# Patient Record
Sex: Female | Born: 1950 | Race: White | Hispanic: No | Marital: Single | Smoking: Never smoker
Health system: Southern US, Community
[De-identification: ages and names within clinical notes are randomized; demographics above are authoritative.]

## PROBLEM LIST (undated history)

## (undated) DIAGNOSIS — C9110 Chronic lymphocytic leukemia of B-cell type not having achieved remission: Secondary | ICD-10-CM

## (undated) DIAGNOSIS — R519 Headache, unspecified: Secondary | ICD-10-CM

## (undated) DIAGNOSIS — E785 Hyperlipidemia, unspecified: Secondary | ICD-10-CM

## (undated) DIAGNOSIS — C801 Malignant (primary) neoplasm, unspecified: Secondary | ICD-10-CM

## (undated) DIAGNOSIS — I10 Essential (primary) hypertension: Secondary | ICD-10-CM

## (undated) DIAGNOSIS — C8590 Non-Hodgkin lymphoma, unspecified, unspecified site: Secondary | ICD-10-CM

## (undated) DIAGNOSIS — E079 Disorder of thyroid, unspecified: Secondary | ICD-10-CM

## (undated) DIAGNOSIS — M1990 Unspecified osteoarthritis, unspecified site: Secondary | ICD-10-CM

## (undated) DIAGNOSIS — Z973 Presence of spectacles and contact lenses: Secondary | ICD-10-CM

## (undated) DIAGNOSIS — E039 Hypothyroidism, unspecified: Secondary | ICD-10-CM

## (undated) DIAGNOSIS — T7840XA Allergy, unspecified, initial encounter: Secondary | ICD-10-CM

## (undated) DIAGNOSIS — R51 Headache: Secondary | ICD-10-CM

## (undated) HISTORY — DX: Hyperlipidemia, unspecified: E78.5

## (undated) HISTORY — PX: TONSILLECTOMY: SUR1361

## (undated) HISTORY — PX: ABDOMINAL HYSTERECTOMY: SHX81

## (undated) HISTORY — DX: Disorder of thyroid, unspecified: E07.9

## (undated) HISTORY — PX: LYMPH NODE DISSECTION: SHX5087

## (undated) HISTORY — PX: COLONOSCOPY: SHX174

## (undated) HISTORY — DX: Essential (primary) hypertension: I10

## (undated) HISTORY — DX: Malignant (primary) neoplasm, unspecified: C80.1

## (undated) HISTORY — PX: BREAST EXCISIONAL BIOPSY: SUR124

## (undated) HISTORY — DX: Unspecified osteoarthritis, unspecified site: M19.90

## (undated) HISTORY — PX: STRABISMUS SURGERY: SHX218

## (undated) HISTORY — DX: Allergy, unspecified, initial encounter: T78.40XA

---

## 1996-02-26 LAB — CONVERTED CEMR LAB: Pap Smear: NORMAL

## 2001-08-31 ENCOUNTER — Emergency Department (HOSPITAL_COMMUNITY): Admission: EM | Admit: 2001-08-31 | Discharge: 2001-08-31 | Payer: Self-pay | Admitting: Emergency Medicine

## 2009-05-12 ENCOUNTER — Ambulatory Visit: Payer: Self-pay | Admitting: Oncology

## 2009-06-06 LAB — COMPREHENSIVE METABOLIC PANEL
Albumin: 4.6 g/dL (ref 3.5–5.2)
BUN: 13 mg/dL (ref 6–23)
CO2: 26 mEq/L (ref 19–32)
Calcium: 9.2 mg/dL (ref 8.4–10.5)
Chloride: 101 mEq/L (ref 96–112)
Creatinine, Ser: 0.8 mg/dL (ref 0.40–1.20)
Glucose, Bld: 217 mg/dL — ABNORMAL HIGH (ref 70–99)
Potassium: 3.8 mEq/L (ref 3.5–5.3)
Total Bilirubin: 0.5 mg/dL (ref 0.3–1.2)
Total Protein: 6.6 g/dL (ref 6.0–8.3)

## 2009-06-06 LAB — CBC & DIFF AND RETIC
Immature Retic Fract: 2.2 % (ref 0.00–10.70)
LYMPH%: 31.3 % (ref 14.0–49.7)
MCH: 29.5 pg (ref 25.1–34.0)
NEUT%: 56.7 % (ref 38.4–76.8)
Platelets: 194 10*3/uL (ref 145–400)
RBC: 5.18 10*6/uL (ref 3.70–5.45)
RDW: 13.2 % (ref 11.2–14.5)
Retic %: 1.14 % (ref 0.50–1.50)

## 2009-06-06 LAB — FOLATE: Folate: 10.4 ng/mL

## 2009-06-06 LAB — VITAMIN B12: Vitamin B-12: 334 pg/mL (ref 211–911)

## 2009-06-06 LAB — FERRITIN: Ferritin: 63 ng/mL (ref 10–291)

## 2009-06-06 LAB — CHCC SMEAR

## 2009-06-06 LAB — URIC ACID: Uric Acid, Serum: 6.3 mg/dL (ref 2.4–7.0)

## 2009-06-06 LAB — MORPHOLOGY: PLT EST: ADEQUATE

## 2009-07-14 ENCOUNTER — Ambulatory Visit: Payer: Self-pay | Admitting: Oncology

## 2009-07-18 LAB — CBC & DIFF AND RETIC
Basophils Absolute: 0 10*3/uL (ref 0.0–0.1)
EOS%: 7 % (ref 0.0–7.0)
Eosinophils Absolute: 0.4 10*3/uL (ref 0.0–0.5)
Immature Retic Fract: 4.6 % (ref 0.00–10.70)
LYMPH%: 32.4 % (ref 14.0–49.7)
MCHC: 34.1 g/dL (ref 31.5–36.0)
MCV: 85.5 fL (ref 79.5–101.0)
MONO#: 0.4 10*3/uL (ref 0.1–0.9)
MONO%: 6.6 % (ref 0.0–14.0)
NEUT%: 53.2 % (ref 38.4–76.8)
RDW: 13.4 % (ref 11.2–14.5)
Retic %: 1.19 % (ref 0.50–1.50)
Retic Ct Abs: 63.19 10*3/uL (ref 18.30–72.70)

## 2009-07-18 LAB — COMPREHENSIVE METABOLIC PANEL
AST: 28 U/L (ref 0–37)
BUN: 12 mg/dL (ref 6–23)
CO2: 25 mEq/L (ref 19–32)
Creatinine, Ser: 0.71 mg/dL (ref 0.40–1.20)
Glucose, Bld: 158 mg/dL — ABNORMAL HIGH (ref 70–99)
Total Bilirubin: 0.8 mg/dL (ref 0.3–1.2)

## 2009-07-29 ENCOUNTER — Ambulatory Visit: Payer: Self-pay | Admitting: Internal Medicine

## 2009-07-29 DIAGNOSIS — J45909 Unspecified asthma, uncomplicated: Secondary | ICD-10-CM

## 2009-07-29 DIAGNOSIS — I10 Essential (primary) hypertension: Secondary | ICD-10-CM

## 2009-07-29 DIAGNOSIS — E1165 Type 2 diabetes mellitus with hyperglycemia: Secondary | ICD-10-CM

## 2009-07-29 DIAGNOSIS — R9431 Abnormal electrocardiogram [ECG] [EKG]: Secondary | ICD-10-CM

## 2009-07-29 DIAGNOSIS — E039 Hypothyroidism, unspecified: Secondary | ICD-10-CM

## 2009-07-29 DIAGNOSIS — M1710 Unilateral primary osteoarthritis, unspecified knee: Secondary | ICD-10-CM

## 2009-08-01 ENCOUNTER — Encounter: Payer: Self-pay | Admitting: Internal Medicine

## 2009-08-03 ENCOUNTER — Telehealth (INDEPENDENT_AMBULATORY_CARE_PROVIDER_SITE_OTHER): Payer: Self-pay | Admitting: Radiology

## 2009-08-18 ENCOUNTER — Encounter: Payer: Self-pay | Admitting: Internal Medicine

## 2009-09-07 ENCOUNTER — Ambulatory Visit: Payer: Self-pay | Admitting: Internal Medicine

## 2009-09-07 DIAGNOSIS — E7800 Pure hypercholesterolemia, unspecified: Secondary | ICD-10-CM

## 2009-09-15 ENCOUNTER — Encounter: Payer: Self-pay | Admitting: Internal Medicine

## 2009-10-14 ENCOUNTER — Ambulatory Visit: Payer: Self-pay | Admitting: Oncology

## 2009-10-18 LAB — WHOLE BLOOD GLUCOSE

## 2009-10-18 LAB — CBC WITH DIFFERENTIAL/PLATELET
EOS%: 7.1 % — ABNORMAL HIGH (ref 0.0–7.0)
MCH: 29.6 pg (ref 25.1–34.0)
MONO%: 6.1 % (ref 0.0–14.0)
RBC: 5.1 10*6/uL (ref 3.70–5.45)
RDW: 13.7 % (ref 11.2–14.5)
WBC: 6.2 10*3/uL (ref 3.9–10.3)
lymph#: 1.9 10*3/uL (ref 0.9–3.3)

## 2009-10-24 ENCOUNTER — Telehealth: Payer: Self-pay | Admitting: Internal Medicine

## 2010-01-11 ENCOUNTER — Ambulatory Visit: Payer: Self-pay | Admitting: Oncology

## 2010-01-12 LAB — COMPREHENSIVE METABOLIC PANEL
Alkaline Phosphatase: 89 U/L (ref 39–117)
BUN: 16 mg/dL (ref 6–23)
CO2: 26 mEq/L (ref 19–32)
Chloride: 103 mEq/L (ref 96–112)
Potassium: 3.9 mEq/L (ref 3.5–5.3)
Total Bilirubin: 0.6 mg/dL (ref 0.3–1.2)

## 2010-01-12 LAB — CBC WITH DIFFERENTIAL/PLATELET
BASO%: 0.5 % (ref 0.0–2.0)
LYMPH%: 29.9 % (ref 14.0–49.7)
MONO%: 4.6 % (ref 0.0–14.0)
NEUT%: 58 % (ref 38.4–76.8)
RBC: 5 10*6/uL (ref 3.70–5.45)
WBC: 5 10*3/uL (ref 3.9–10.3)

## 2010-01-24 ENCOUNTER — Ambulatory Visit: Payer: Self-pay | Admitting: Internal Medicine

## 2010-01-24 ENCOUNTER — Encounter (INDEPENDENT_AMBULATORY_CARE_PROVIDER_SITE_OTHER): Payer: Self-pay | Admitting: *Deleted

## 2010-01-24 LAB — CONVERTED CEMR LAB
Albumin: 4.2 g/dL (ref 3.5–5.2)
BUN: 17 mg/dL (ref 6–23)
Calcium: 9.5 mg/dL (ref 8.4–10.5)
Chloride: 101 meq/L (ref 96–112)
Cholesterol: 207 mg/dL — ABNORMAL HIGH (ref 0–200)
Creatinine, Ser: 0.7 mg/dL (ref 0.4–1.2)
Direct LDL: 145 mg/dL
Glucose, Bld: 109 mg/dL — ABNORMAL HIGH (ref 70–99)
Hemoglobin, Urine: NEGATIVE
Ketones, ur: NEGATIVE mg/dL
Leukocytes, UA: NEGATIVE
Nitrite: NEGATIVE
Total CHOL/HDL Ratio: 5
Total Protein, Urine: NEGATIVE mg/dL
Triglycerides: 192 mg/dL — ABNORMAL HIGH (ref 0.0–149.0)
Urobilinogen, UA: 0.2 (ref 0.0–1.0)
pH: 5.5 (ref 5.0–8.0)

## 2010-01-25 ENCOUNTER — Telehealth: Payer: Self-pay | Admitting: Internal Medicine

## 2010-01-25 ENCOUNTER — Encounter: Payer: Self-pay | Admitting: Internal Medicine

## 2010-01-31 ENCOUNTER — Encounter: Admission: RE | Admit: 2010-01-31 | Discharge: 2010-01-31 | Payer: Self-pay | Admitting: Internal Medicine

## 2010-01-31 ENCOUNTER — Ambulatory Visit: Payer: Self-pay | Admitting: Internal Medicine

## 2010-01-31 LAB — CONVERTED CEMR LAB
Cholesterol: 247 mg/dL — ABNORMAL HIGH (ref 0–200)
Total CHOL/HDL Ratio: 5
Triglycerides: 248 mg/dL — ABNORMAL HIGH (ref 0.0–149.0)
VLDL: 49.6 mg/dL — ABNORMAL HIGH (ref 0.0–40.0)

## 2010-02-01 ENCOUNTER — Encounter: Payer: Self-pay | Admitting: Internal Medicine

## 2010-02-03 ENCOUNTER — Telehealth: Payer: Self-pay | Admitting: Internal Medicine

## 2010-03-31 ENCOUNTER — Ambulatory Visit: Payer: Self-pay | Admitting: Oncology

## 2010-04-12 LAB — CBC WITH DIFFERENTIAL/PLATELET
Basophils Absolute: 0 10*3/uL (ref 0.0–0.1)
Eosinophils Absolute: 0.4 10*3/uL (ref 0.0–0.5)
HCT: 42 % (ref 34.8–46.6)
HGB: 14.4 g/dL (ref 11.6–15.9)
LYMPH%: 32.2 % (ref 14.0–49.7)
MCHC: 34.2 g/dL (ref 31.5–36.0)
NEUT#: 2.8 10*3/uL (ref 1.5–6.5)
NEUT%: 52.8 % (ref 38.4–76.8)
RDW: 14.1 % (ref 11.2–14.5)
lymph#: 1.7 10*3/uL (ref 0.9–3.3)

## 2010-06-23 ENCOUNTER — Ambulatory Visit: Payer: Self-pay | Admitting: Oncology

## 2010-06-27 LAB — CBC WITH DIFFERENTIAL/PLATELET
Basophils Absolute: 0.1 10*3/uL (ref 0.0–0.1)
EOS%: 8.2 % — ABNORMAL HIGH (ref 0.0–7.0)
HGB: 14.1 g/dL (ref 11.6–15.9)
LYMPH%: 36.2 % (ref 14.0–49.7)
MONO%: 5.7 % (ref 0.0–14.0)
NEUT#: 2 10*3/uL (ref 1.5–6.5)
Platelets: 182 10*3/uL (ref 145–400)
RDW: 14.3 % (ref 11.2–14.5)

## 2010-07-24 ENCOUNTER — Ambulatory Visit: Payer: Self-pay | Admitting: Oncology

## 2010-07-24 LAB — COMPREHENSIVE METABOLIC PANEL
AST: 24 U/L (ref 0–37)
Alkaline Phosphatase: 81 U/L (ref 39–117)
BUN: 13 mg/dL (ref 6–23)
CO2: 26 mEq/L (ref 19–32)
Calcium: 9.2 mg/dL (ref 8.4–10.5)
Chloride: 105 mEq/L (ref 96–112)
Creatinine, Ser: 0.75 mg/dL (ref 0.40–1.20)
Glucose, Bld: 132 mg/dL — ABNORMAL HIGH (ref 70–99)
Potassium: 4.1 mEq/L (ref 3.5–5.3)
Sodium: 138 mEq/L (ref 135–145)

## 2010-07-24 LAB — CBC WITH DIFFERENTIAL/PLATELET
BASO%: 0.4 % (ref 0.0–2.0)
MCH: 29.5 pg (ref 25.1–34.0)
NEUT#: 2.5 10*3/uL (ref 1.5–6.5)
NEUT%: 53 % (ref 38.4–76.8)
Platelets: 206 10*3/uL (ref 145–400)
WBC: 4.7 10*3/uL (ref 3.9–10.3)
lymph#: 1.5 10*3/uL (ref 0.9–3.3)

## 2010-07-24 LAB — HEMOGLOBIN A1C
Hgb A1c MFr Bld: 6.6 % — ABNORMAL HIGH (ref <5.7)
Mean Plasma Glucose: 143 mg/dL — ABNORMAL HIGH (ref <117)

## 2010-10-08 LAB — CONVERTED CEMR LAB
ALT: 30 units/L (ref 0–35)
AST: 27 units/L (ref 0–37)
Cholesterol: 214 mg/dL — ABNORMAL HIGH (ref 0–200)
Direct LDL: 153.6 mg/dL
Glucose, Bld: 163 mg/dL — ABNORMAL HIGH (ref 70–99)
HDL: 36.5 mg/dL — ABNORMAL LOW (ref >39.00)
Hgb A1c MFr Bld: 8.5 % — ABNORMAL HIGH (ref 4.6–6.5)
Nitrite: NEGATIVE
Sodium: 141 meq/L (ref 135–145)
Specific Gravity, Urine: 1.025 (ref 1.000–1.030)
Total CHOL/HDL Ratio: 6
Total Protein, Urine: NEGATIVE mg/dL
Total Protein: 7.5 g/dL (ref 6.0–8.3)
Urobilinogen, UA: 0.2 (ref 0.0–1.0)
VLDL: 41.4 mg/dL — ABNORMAL HIGH (ref 0.0–40.0)
pH: 5 (ref 5.0–8.0)

## 2010-10-10 NOTE — Assessment & Plan Note (Signed)
Summary: 3 MO ROV /NWS  #   Vital Signs:  Patient profile:   60 year old female Height:      67 inches Weight:      217.50 pounds BMI:     34.19 O2 Sat:      96 % on Room air Temp:     97.4 degrees F oral Pulse rate:   68 / minute Pulse rhythm:   regular Resp:     16 per minute BP sitting:   132 / 78  (left arm) Cuff size:   regular  Vitals Entered By: Lucious Groves (Jan 24, 2010 9:56 AM)  Nutrition Counseling: Patient's BMI is greater than 25 and therefore counseled on weight management options.  O2 Flow:  Room air CC: Follow-up visit./kb, Preventive Care Is Patient Diabetic? Yes Pain Assessment Patient in pain? no      Comments Patient notes that she is not using the Freestyle meter/strips, nor is she taking Metformin./kb   Primary Care Provider:  Etta Grandchild MD  CC:  Follow-up visit./kb and Preventive Care.  History of Present Illness:  Follow-Up Visit      This is a 60 year old woman who presents for Follow-up visit.  The patient denies chest pain, palpitations, dizziness, syncope, low blood sugar symptoms, high blood sugar symptoms, edema, SOB, DOE, PND, and orthopnea.  Since the last visit the patient notes no new problems or concerns.  The patient reports taking meds as prescribed, monitoring BP, monitoring blood sugars, and dietary compliance.  When questioned about possible medication side effects, the patient notes none.    Preventive Screening-Counseling & Management  Alcohol-Tobacco     Alcohol drinks/day: 0     Smoking Status: never  Hep-HIV-STD-Contraception     Hepatitis Risk: no risk noted     HIV Risk: no risk noted     STD Risk: no risk noted     SBE monthly: yes     SBE Education/Counseling: to perform regular SBE      Sexual History:  not active.        Drug Use:  no.        Blood Transfusions:  no.    Clinical Review Panels:  Lipid Management   Cholesterol:  214 (07/29/2009)   HDL (good cholesterol):  36.50  (07/29/2009)  Diabetes Management   HgBA1C:  8.5 (07/29/2009)   Creatinine:  0.7 (07/29/2009)   Last Foot Exam:  yes (01/24/2010)   Last Flu Vaccine:  Fluvax 3+ (07/29/2009)   Last Pneumovax:  Pneumovax (06/29/2009)  Complete Metabolic Panel   Glucose:  163 (07/29/2009)   Sodium:  141 (07/29/2009)   Potassium:  4.1 (07/29/2009)   Chloride:  103 (07/29/2009)   CO2:  31 (07/29/2009)   BUN:  9 (07/29/2009)   Creatinine:  0.7 (07/29/2009)   Albumin:  4.4 (07/29/2009)   Total Protein:  7.5 (07/29/2009)   Calcium:  9.3 (07/29/2009)   Total Bili:  0.6 (07/29/2009)   Alk Phos:  96 (07/29/2009)   SGPT (ALT):  30 (07/29/2009)   SGOT (AST):  27 (07/29/2009)   Medications Prior to Update: 1)  Synthroid 88 Mcg Tabs (Levothyroxine Sodium) .... Take 1 Tablet By Mouth Once A Day 2)  Advair Diskus 100-50 Mcg/dose Aepb (Fluticasone-Salmeterol) .... One Puff Daily 3)  Diovan Hct 160-12.5 Mg Tabs (Valsartan-Hydrochlorothiazide) .... Once Daily For High Blood Pressure 4)  Freestyle Lite  Devi (Blood Glucose Monitoring Suppl) .... Use Two Times A Day As Directed 5)  Freestyle Lite Test  Strp (Glucose Blood) .... Use Two Times A Day As Directed 6)  Metformin Hcl 750 Mg Xr24h-Tab (Metformin Hcl) .... One Once Daily For Diabetes 7)  Zithromax Z-Pak 250 Mg Tabs (Azithromycin) .... As Directed  Current Medications (verified): 1)  Synthroid 88 Mcg Tabs (Levothyroxine Sodium) .... Take 1 Tablet By Mouth Once A Day 2)  Advair Diskus 100-50 Mcg/dose Aepb (Fluticasone-Salmeterol) .... One Puff Daily 3)  Diovan Hct 160-12.5 Mg Tabs (Valsartan-Hydrochlorothiazide) .... Once Daily For High Blood Pressure  Allergies (verified): 1)  ! Keflex  Past History:  Past Medical History: Reviewed history from 07/29/2009 and no changes required. Hypertension Hypothyroidism CLL Asthma Osteoarthritis Probable sleep apnea  Past Surgical History: Reviewed history from 07/29/2009 and no changes  required. Hysterectomy Tonsillectomy  Family History: Reviewed history from 07/29/2009 and no changes required. Family History of CAD Female 1st degree relative <50 Family History Diabetes 1st degree relative Family History High cholesterol Family History Hypertension  Social History: Reviewed history from 07/29/2009 and no changes required. Occupation: OR Engineer, civil (consulting) at [employer] Single Never Smoked Alcohol use-no Drug use-no Regular exercise-no Hepatitis Risk:  no risk noted HIV Risk:  no risk noted STD Risk:  no risk noted Sexual History:  not active Blood Transfusions:  no  Review of Systems  The patient denies anorexia, fever, weight gain, chest pain, syncope, dyspnea on exertion, peripheral edema, prolonged cough, headaches, hemoptysis, abdominal pain, hematuria, suspicious skin lesions, difficulty walking, depression, abnormal bleeding, enlarged lymph nodes, and breast masses.   Endo:  Denies cold intolerance, excessive hunger, excessive thirst, excessive urination, heat intolerance, polyuria, and weight change. Heme:  Denies abnormal bruising, bleeding, enlarge lymph nodes, fevers, pallor, and skin discoloration.  Physical Exam  General:  alert, well-developed, well-nourished, well-hydrated, appropriate dress, normal appearance, healthy-appearing, cooperative to examination, and overweight-appearing.   Head:  normocephalic, atraumatic, no abnormalities observed, and no abnormalities palpated.   Mouth:  Oral mucosa and oropharynx without lesions or exudates.  Teeth in good repair. Neck:  supple, full ROM, no masses, no thyromegaly, no thyroid nodules or tenderness, no JVD, normal carotid upstroke, no carotid bruits, and no cervical lymphadenopathy.   Lungs:  normal respiratory effort, no intercostal retractions, no accessory muscle use, and normal breath sounds.   Heart:  normal rate, regular rhythm, no murmur, no gallop, and no rub.   Abdomen:  soft, non-tender, normal bowel  sounds, no distention, no masses, no guarding, no hepatomegaly, and no splenomegaly.   Msk:  normal ROM, no joint tenderness, no joint swelling, and no joint warmth.   Pulses:  R and L carotid,radial,femoral,dorsalis pedis and posterior tibial pulses are full and equal bilaterally Extremities:  No clubbing, cyanosis, edema, or deformity noted with normal full range of motion of all joints.   Neurologic:  No cranial nerve deficits noted. Station and gait are normal. Plantar reflexes are down-going bilaterally. DTRs are symmetrical throughout. Sensory, motor and coordinative functions appear intact.  Diabetes Management Exam:    Foot Exam (with socks and/or shoes not present):       Sensory-Pinprick/Light touch:          Left medial foot (L-4): normal          Left dorsal foot (L-5): normal          Left lateral foot (S-1): normal          Right medial foot (L-4): normal          Right dorsal foot (L-5): normal  Right lateral foot (S-1): normal       Sensory-Monofilament:          Left foot: normal          Right foot: normal       Inspection:          Left foot: normal          Right foot: normal       Nails:          Left foot: normal          Right foot: normal   Impression & Recommendations:  Problem # 1:  ROUTINE GENERAL MEDICAL EXAM@HEALTH  CARE FACL (ICD-V70.0) Assessment New  Orders: Radiology Referral (Radiology) Gastroenterology Referral (GI)  Problem # 2:  PURE HYPERCHOLESTEROLEMIA (ICD-272.0) Assessment: Unchanged  Orders: Venipuncture (30865) TLB-BMP (Basic Metabolic Panel-BMET) (80048-METABOL) TLB-Hepatic/Liver Function Pnl (80076-HEPATIC) TLB-TSH (Thyroid Stimulating Hormone) (84443-TSH) TLB-A1C / Hgb A1C (Glycohemoglobin) (83036-A1C) TLB-Udip w/ Micro (81001-URINE) TLB-Lipid Panel (80061-LIPID)  Labs Reviewed: SGOT: 27 (07/29/2009)   SGPT: 30 (07/29/2009)  Prior 10 Yr Risk Heart Disease: Not enough information (07/29/2009)   HDL:36.50  (07/29/2009)  Chol:214 (07/29/2009)  Trig:207.0 (07/29/2009)  Problem # 3:  HYPOTHYROIDISM (ICD-244.9) Assessment: Unchanged  Her updated medication list for this problem includes:    Synthroid 88 Mcg Tabs (Levothyroxine sodium) .Marland Kitchen... 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 One once daily for diabetes Her updated medication list for this problem includes:    Diovan Hct 160-12.5 Mg Tabs (Valsartan-hydrochlorothiazide) ..... Once daily for high blood pressure  Orders: Venipuncture (62952) TLB-BMP (Basic Metabolic Panel-BMET) (80048-METABOL) TLB-Hepatic/Liver Function Pnl (80076-HEPATIC) TLB-TSH (Thyroid Stimulating Hormone) (84443-TSH) TLB-A1C / Hgb A1C (Glycohemoglobin) (83036-A1C) TLB-Udip w/ Micro (81001-URINE) Ophthalmology Referral (Ophthalmology)  Labs Reviewed: Creat: 0.7 (07/29/2009)    Reviewed HgBA1c results: 8.5 (07/29/2009)  Complete Medication List: 1)  Synthroid 88 Mcg Tabs (Levothyroxine sodium) .... Take 1 tablet by mouth once a day 2)  Advair Diskus 100-50 Mcg/dose Aepb (Fluticasone-salmeterol) .... One puff daily 3)  Diovan Hct 160-12.5 Mg Tabs (Valsartan-hydrochlorothiazide) .... Once daily for high blood pressure  Colorectal Screening:  Current Recommendations:    Colonoscopy recommended: scheduled with [facility].  PAP Screening:    Hx Cervical  Dysplasia in last 5 yrs? No    3 normal PAP smears in last 5 yrs? No    Last PAP smear:  02/26/1996    Reviewed PAP smear recommendations:  patient defers to GYN provider  PAP Smear Results:    Date of Exam:  02/26/1996    Results:  Normal  Mammogram Screening:    Reviewed Mammogram recommendations:  mammogram ordered  Osteoporosis Risk Assessment:  Risk Factors for Fracture or Low Bone Density:   Race (White or Asian):     yes   Hx of Fractures:       no   FH of Osteoporosis:     no   Hx of falls:       no   Physically  inactive:     no   Smoking status:       never   High alcohol use:     no   High caffeine use:     no   Low calcium/Vit. D intake:   no   Corticosteroid use:     no   Thyroid replacement:     yes   Dilantin use:       no   Heparin use:       no   Thyroid disease:     yes   Rheumatoid Arthritis:     no   Parathyroid disease:     no  Immunization & Chemoprophylaxis:    Influenza vaccine: Fluvax 3+  (07/29/2009)    Pneumovax: Pneumovax  (06/29/2009)  Patient Instructions: 1)  Please schedule a follow-up appointment in 4 months. 2)  It is important that you exercise regularly at least 20 minutes 5 times a week. If you develop chest pain, have severe difficulty breathing, or feel very tired , stop exercising immediately and seek medical attention. 3)  You need to lose weight. Consider a lower calorie diet and regular exercise.  4)  Check your blood sugars regularly. If your readings are usually above 200  or below 70 you should contact our office. 5)  It is important that your Diabetic A1c level is checked every 3 months. 6)  See your eye doctor yearly to check for diabetic eye damage. 7)  Check your feet each night for sore areas, calluses or signs of infection. 8)  Check your Blood Pressure regularly. If it is above 130/80: you should make an appointment.

## 2010-10-10 NOTE — Progress Notes (Signed)
Summary: REQ A CALL  Phone Note Call from Patient Call back at Home Phone [phone] Call back at [phone]   Summary of Call: Patient is requesting a call from Dr Yetta Barre directly. She has concerns regarding signifigant change in lipid panel over the course of a week.  Initial call taken by: Lamar Sprinkles, CMA,  Feb 03, 2010 11:00 AM  Follow-up for Phone Call        cholesterol results can vary. the bottomline for her as a diabetic is that she needs to have an LDL<100 Follow-up by: Etta Grandchild MD,  Feb 03, 2010 11:17 AM  Additional Follow-up for Phone Call Additional follow up Details #1::        Pt informed  Additional Follow-up by: Lamar Sprinkles, CMA,  Feb 03, 2010 11:57 AM

## 2010-10-10 NOTE — Progress Notes (Signed)
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 She is req rx for sinus infection. Pt c/o fever (100.0), sinus pressure/drainage and slightly productive cough. Mucus is green/gray. Symptoms started thursday. She is unable to come into the office today and wants to get med started so she is allowed to work tomorrow. Pt aware that Dr Maria [name] is out of the office but I would ask another MD in the office.  Initial call taken by: Lamar Sprinkles, CMA,  October 24, 2009 3:23 PM  Follow-up for Phone Call        OK z-pak as directed.  Follow-up by: Jacques Navy MD,  October 24, 2009 6:07 PM  Additional Follow-up for Phone Call Additional follow up Details #1::        Pt informed, advised office visit if not better or becomes worse Additional Follow-up by: Lamar Sprinkles, CMA,  October 24, 2009 6:11 PM    New/Updated Medications: ZITHROMAX Z-PAK 250 MG TABS (AZITHROMYCIN) as directed Prescriptions: ZITHROMAX Z-PAK 250 MG TABS (AZITHROMYCIN) as directed  #1 x 0   Entered by:   Lamar Sprinkles, CMA   Authorized by:   Jacques Navy MD   Signed by:   Lamar Sprinkles, CMA on 10/24/2009   Method used:   Electronically to        [facility]  [address] (retail)       [address]       [place], [place]  [address]       Ph: [phone]       Fax: [phone]   RxID:   [id number]

## 2010-10-10 NOTE — Letter (Signed)
Summary: [facility]   [facility]   Imported By: Sherian Rein 09/19/2009 07:53:12  _____________________________________________________________________  External Attachment:    Type:   Image     Comment:   External Document

## 2010-10-10 NOTE — Progress Notes (Signed)
  Phone Note Outgoing Call   Initial call taken by: Etta Grandchild MD,  Jan 25, 2010 7:41 AM    New/Updated Medications: CRESTOR 20 MG TABS (ROSUVASTATIN CALCIUM) One by mouth once daily Prescriptions: CRESTOR 20 MG TABS (ROSUVASTATIN CALCIUM) One by mouth once daily  #30 x 11   Entered and Authorized by:   Etta Grandchild MD   Signed by:   Etta Grandchild MD on 01/25/2010   Method used:   Print then Give to Patient   RxID:   [id number]

## 2010-10-10 NOTE — Letter (Signed)
Summary: Lipid Letter  [facility]  [address] [place], [place] [address]   Phone: [phone]  Fax: [phone]    01/25/2010  Maria [name] [address] [place], [place]  [address]  Dear Maria [name]:  We have carefully reviewed your last lipid profile from  and the results are noted below with a summary of recommendations for lipid management.    Cholesterol:       207     Goal: <200   HDL "good" Cholesterol:   57.84     Goal: >40   LDL "bad" Cholesterol:   145     Goal: <100   Triglycerides:       192.0     Goal: <150    Maria [name], I guess the lab went ahead and did the lipid panel. The triglycerides may be high b/c you weren't fasting but the LDL needs to be lower. All diabetics need a statin so I have sent an Rx for Crestor with this letter. Let me know your thoughts.    TLC Diet (Therapeutic Lifestyle Change): Saturated Fats & Transfatty acids should be kept < 7% of total calories ***Reduce Saturated Fats Polyunstaurated Fat can be up to 10% of total calories Monounsaturated Fat Fat can be up to 20% of total calories Total Fat should be no greater than 25-35% of total calories Carbohydrates should be 50-60% of total calories Protein should be approximately 15% of total calories Fiber should be at least 20-30 grams a day ***Increased fiber may help lower LDL Total Cholesterol should be < 200mg /day Consider adding plant stanol/sterols to diet (example: Benacol spread) ***A higher intake of unsaturated fat may reduce Triglycerides and Increase HDL    Adjunctive Measures (may lower LIPIDS and reduce risk of Heart Attack) include: Aerobic Exercise (20-30 minutes 3-4 times a week) Limit Alcohol Consumption Weight Reduction Aspirin 75-81 mg a day by mouth (if not allergic or contraindicated) Dietary Fiber 20-30 grams a day by mouth     Current Medications: 1)    Synthroid 88 Mcg Tabs (Levothyroxine sodium) .... Take 1 tablet by mouth once a day 2)    Advair  Diskus 100-50 Mcg/dose Aepb (Fluticasone-salmeterol) .... One puff daily 3)    Diovan Hct 160-12.5 Mg Tabs (Valsartan-hydrochlorothiazide) .... Once daily for high blood pressure  If you have any questions, please call. We appreciate being able to work with you.   Sincerely,    [facility] Etta Grandchild MD

## 2010-10-10 NOTE — Letter (Signed)
Summary: Results Follow-up Letter  [facility]  [address] [place], [place] [address]   Phone: [phone]  Fax: [phone]    01/25/2010  [address] [place], [place]  [address]  Dear Ms. Penafiel,   The following are the results of your recent test(s):  Test     Result     A1C=6.7     Excellent! Liver/kidney   normal Thyroid     normal Urine       normal   _________________________________________________________  Please call for an appointment as directed _________________________________________________________ _________________________________________________________ _________________________________________________________  Sincerely,  Sanda Linger MD [facility]

## 2010-10-10 NOTE — Letter (Signed)
Summary: [facility] Consult Scheduled Letter  [facility]  [address] [place], [place] [address]   Phone: [phone]  Fax: [phone]      01/24/2010 MRN: [id number]  Maria [name] [address] [place], [place]  [address]    Dear Ms. Claunch,      We have scheduled an appointment for you.  At the recommendation of Dr.Jones, we have scheduled you a consult with [facility] on 01/31/10 at 12:10pm.  Their phone number is [phone].  If this appointment day and time is not convenient for you, please feel free to call the office of the doctor you are being referred to at the number listed above and reschedule the appointment.      [facility] [address] [place], [place]. [address] [place], [place] [address]    Please bring films of last mammogram with you.    Thank you,  Patient Care Coordinator [facility]

## 2010-10-10 NOTE — Letter (Signed)
Summary: Lipid Letter  [facility]  [address] [place], [place] [address]   Phone: [phone]  Fax: [phone]    02/01/2010  Maria [name] [address] [place], [place]  [address]  Dear Maria [name]:  We have carefully reviewed your last lipid profile from  and the results are noted below with a summary of recommendations for lipid management.    Cholesterol:       247     Goal: <   HDL "good" Cholesterol:   57.84     Goal: >   LDL "bad" Cholesterol:   167     Goal: <   Triglycerides:       248.0     Goal: <        TLC Diet (Therapeutic Lifestyle Change): Saturated Fats & Transfatty acids should be kept < 7% of total calories ***Reduce Saturated Fats Polyunstaurated Fat can be up to 10% of total calories Monounsaturated Fat Fat can be up to 20% of total calories Total Fat should be no greater than 25-35% of total calories Carbohydrates should be 50-60% of total calories Protein should be approximately 15% of total calories Fiber should be at least 20-30 grams a day ***Increased fiber may help lower LDL Total Cholesterol should be < 200mg /day Consider adding plant stanol/sterols to diet (example: Benacol spread) ***A higher intake of unsaturated fat may reduce Triglycerides and Increase HDL    Adjunctive Measures (may lower LIPIDS and reduce risk of Heart Attack) include: Aerobic Exercise (20-30 minutes 3-4 times a week) Limit Alcohol Consumption Weight Reduction Aspirin 75-81 mg a day by mouth (if not allergic or contraindicated) Dietary Fiber 20-30 grams a day by mouth     Current Medications: 1)    Synthroid 88 Mcg Tabs (Levothyroxine sodium) .... Take 1 tablet by mouth once a day 2)    Advair Diskus 100-50 Mcg/dose Aepb (Fluticasone-salmeterol) .... One puff daily 3)    Diovan Hct 160-12.5 Mg Tabs (Valsartan-hydrochlorothiazide) .... Once daily for high blood pressure 4)    Crestor 20 Mg Tabs (Rosuvastatin calcium) .... One by mouth once daily  If  you have any questions, please call. We appreciate being able to work with you.   Sincerely,    [facility] Maria Grandchild MD

## 2010-12-08 ENCOUNTER — Other Ambulatory Visit: Payer: Self-pay | Admitting: Oncology

## 2010-12-08 ENCOUNTER — Encounter (HOSPITAL_BASED_OUTPATIENT_CLINIC_OR_DEPARTMENT_OTHER): Payer: BC Managed Care – PPO | Admitting: Oncology

## 2010-12-08 DIAGNOSIS — R7309 Other abnormal glucose: Secondary | ICD-10-CM

## 2010-12-08 DIAGNOSIS — C9110 Chronic lymphocytic leukemia of B-cell type not having achieved remission: Secondary | ICD-10-CM

## 2010-12-08 LAB — CBC WITH DIFFERENTIAL/PLATELET
EOS%: 7.5 % — ABNORMAL HIGH (ref 0.0–7.0)
Eosinophils Absolute: 0.4 10*3/uL (ref 0.0–0.5)
MCHC: 34.1 g/dL (ref 31.5–36.0)
MONO#: 0.4 10*3/uL (ref 0.1–0.9)
MONO%: 6.4 % (ref 0.0–14.0)
NEUT%: 50.8 % (ref 38.4–76.8)
WBC: 5.8 10*3/uL (ref 3.9–10.3)

## 2011-03-06 ENCOUNTER — Ambulatory Visit (AMBULATORY_SURGERY_CENTER): Payer: BC Managed Care – PPO | Admitting: *Deleted

## 2011-03-06 ENCOUNTER — Encounter: Payer: Self-pay | Admitting: Internal Medicine

## 2011-03-06 VITALS — Ht 67.0 in | Wt 223.4 lb

## 2011-03-06 DIAGNOSIS — Z1211 Encounter for screening for malignant neoplasm of colon: Secondary | ICD-10-CM

## 2011-03-06 MED ORDER — PEG-KCL-NACL-NASULF-NA ASC-C 100 G PO SOLR: ORAL | Status: DC

## 2011-03-21 ENCOUNTER — Other Ambulatory Visit: Payer: BC Managed Care – PPO | Admitting: Internal Medicine

## 2011-03-21 ENCOUNTER — Other Ambulatory Visit: Payer: Self-pay | Admitting: Oncology

## 2011-03-21 ENCOUNTER — Encounter (HOSPITAL_BASED_OUTPATIENT_CLINIC_OR_DEPARTMENT_OTHER): Payer: BC Managed Care – PPO | Admitting: Oncology

## 2011-03-21 DIAGNOSIS — C9110 Chronic lymphocytic leukemia of B-cell type not having achieved remission: Secondary | ICD-10-CM

## 2011-03-21 DIAGNOSIS — R7309 Other abnormal glucose: Secondary | ICD-10-CM

## 2011-03-21 LAB — MORPHOLOGY
PLT EST: ADEQUATE
RBC Comments: NORMAL

## 2011-03-21 LAB — CBC WITH DIFFERENTIAL/PLATELET
Eosinophils Absolute: 0.5 10*3/uL (ref 0.0–0.5)
HGB: 14.1 g/dL (ref 11.6–15.9)
LYMPH%: 31 % (ref 14.0–49.7)
MCH: 28.4 pg (ref 25.1–34.0)
MCHC: 33.3 g/dL (ref 31.5–36.0)
NEUT#: 3 10*3/uL (ref 1.5–6.5)
WBC: 5.7 10*3/uL (ref 3.9–10.3)
lymph#: 1.8 10*3/uL (ref 0.9–3.3)

## 2011-04-12 ENCOUNTER — Other Ambulatory Visit: Payer: BC Managed Care – PPO | Admitting: Internal Medicine

## 2011-07-23 ENCOUNTER — Ambulatory Visit (HOSPITAL_BASED_OUTPATIENT_CLINIC_OR_DEPARTMENT_OTHER): Payer: BC Managed Care – PPO | Admitting: Oncology

## 2011-07-23 ENCOUNTER — Other Ambulatory Visit (HOSPITAL_BASED_OUTPATIENT_CLINIC_OR_DEPARTMENT_OTHER): Payer: BC Managed Care – PPO | Admitting: Lab

## 2011-07-23 ENCOUNTER — Other Ambulatory Visit: Payer: Self-pay | Admitting: Oncology

## 2011-07-23 ENCOUNTER — Telehealth: Payer: Self-pay | Admitting: *Deleted

## 2011-07-23 VITALS — BP 133/75 | HR 74 | Temp 98.6°F | Ht 66.5 in | Wt 219.8 lb

## 2011-07-23 DIAGNOSIS — Z1231 Encounter for screening mammogram for malignant neoplasm of breast: Secondary | ICD-10-CM

## 2011-07-23 DIAGNOSIS — C9110 Chronic lymphocytic leukemia of B-cell type not having achieved remission: Secondary | ICD-10-CM

## 2011-07-23 DIAGNOSIS — R7309 Other abnormal glucose: Secondary | ICD-10-CM

## 2011-07-23 LAB — CBC WITH DIFFERENTIAL/PLATELET
BASO%: 0.5 % (ref 0.0–2.0)
EOS%: 7.2 % — ABNORMAL HIGH (ref 0.0–7.0)
HCT: 45.1 % (ref 34.8–46.6)
MCH: 29.7 pg (ref 25.1–34.0)
MCHC: 34.1 g/dL (ref 31.5–36.0)
MONO#: 0.5 10*3/uL (ref 0.1–0.9)
NEUT%: 52.8 % (ref 38.4–76.8)
RBC: 5.19 10*6/uL (ref 3.70–5.45)
WBC: 6.7 10*3/uL (ref 3.9–10.3)
lymph#: 2.2 10*3/uL (ref 0.9–3.3)

## 2011-07-23 LAB — MORPHOLOGY
PLT EST: ADEQUATE
RBC Comments: NORMAL

## 2011-07-23 LAB — CHCC SMEAR

## 2011-07-23 NOTE — Progress Notes (Signed)
ID: [name]   Interval History: The patient is doing well. His last visit here she has bought a townhouse in [place] and this is identical of course which is helping her with exercise. On the negative side of she had an episode of shingles in her right flank that was handled through Dr. Alver Fisher office, and now the pain is almost gone just a little bit of itching. Did establish yourself with Dr. Clelia Croft and he has started her on medication for at the knees. She has also found that she is lactose intolerant. She is very pleased that she is losing weight steadily  ROS:  View of systems otherwise was unremarkable and in particular she gives me no history of unusual infections fevers drenching sweats unexplained fatigue or unexplained weight loss rash or bleeding.  Medications: I have reviewed the patient's current medications.   Current Outpatient Prescriptions  Medication Sig Dispense Refill  . DIOVAN HCT 160-12.5 MG per tablet Take by mouth daily.       . Fluticasone-Salmeterol (ADVAIR DISKUS) 100-50 MCG/DOSE AEPB Inhale 1 puff into the lungs every 12 (twelve) hours.        Marland Kitchen LORazepam (ATIVAN) 1 MG tablet Take 1 mg by mouth as needed.       . naproxen sodium (ANAPROX) 220 MG tablet Take 220 mg by mouth as needed.        Marland Kitchen NOVOFINE 32G X 6 MM MISC       . peg 3350 powder (MOVIPREP) 100 G SOLR moviprep as directed  1 kit  0  . SYNTHROID 88 MCG tablet Take 88 mcg by mouth daily.       Marland Kitchen VICTOZA 18 MG/3ML SOLN 1.8 mg daily         Objective: Vital signs in last 24 hours: BP 133/75  Pulse 74  Temp(Src) 98.6 F (37 C) (Oral)  Ht 5' 6.5" (1.689 m)  Wt 219 lb 12.8 oz (99.701 kg)  BMI 34.95 kg/m2   Physical Exam:    Sclerae unicteric  Oropharynx clear  No peripheral adenopathy  Lungs clear -- no rales or rhonchi  Heart regular rate and rhythm  Abdomen benign  MSK no focal spinal tenderness, no peripheral edema  Neuro nonfocal    Lab Results:   BMET    Component Value  Date/Time   NA 138 07/24/2010 1356   K 4.1 07/24/2010 1356   CL 105 07/24/2010 1356   CO2 26 07/24/2010 1356   GLUCOSE 132* 07/24/2010 1356   BUN 13 07/24/2010 1356   CREATININE 0.75 07/24/2010 1356   CALCIUM 9.2 07/24/2010 1356   GFRNONAA 91.03 01/24/2010 1017     CMP     Component Value Date/Time   NA 138 07/24/2010 1356   K 4.1 07/24/2010 1356   CL 105 07/24/2010 1356   CO2 26 07/24/2010 1356   GLUCOSE 132* 07/24/2010 1356   BUN 13 07/24/2010 1356   CREATININE 0.75 07/24/2010 1356   CALCIUM 9.2 07/24/2010 1356   PROT 6.4 07/24/2010 1356   ALBUMIN 4.3 07/24/2010 1356   AST 24 07/24/2010 1356   ALT 26 07/24/2010 1356   ALKPHOS 81 07/24/2010 1356   BILITOT 0.6 07/24/2010 1356   GFRNONAA 91.03 01/24/2010 1017    CBC    Component Value Date/Time   HGB 15.4 07/23/2011 1313   HCT 45.1 07/23/2011 1313   PLT 216 07/23/2011 1313   MCV 86.9 07/23/2011 1313   MCH 30.0 06/27/2010 1409   EOSABS 0.5  07/23/2011 1313   BASOSABS 0.0 07/23/2011 1313        Studies/Results: No results found.  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 Clelia Croft every 4 months or so we can tell he date to him obtaining CBC and differential with each visit certainly if her platelet drop repeatedly to 50,000 or less, or if she developed unexplained anemia, or if her white count started to rise steadily above 40,000, she should be referred back. Otherwise I will see her again in one year she has a very good understanding of what "B." symptoms are and will call if any of those develop.  Incidentally she has not had a mammogram since May of 2011 and we have set her  up for that procedure  [name],GUSTAV C 07/23/2011

## 2011-07-23 NOTE — Telephone Encounter (Signed)
GAVE PATIENT APPOINTMENT FOR 07-2012. GAVE PATIENT APPOINTMENT FOR MAMMOGRAM ON 08-09-2011 AT 7:50AM.

## 2011-08-09 ENCOUNTER — Ambulatory Visit
Admission: RE | Admit: 2011-08-09 | Discharge: 2011-08-09 | Disposition: A | Discharge status: Home / Self Care | Payer: BC Managed Care – PPO | Source: Ambulatory Visit | Attending: Oncology | Admitting: Oncology

## 2011-08-09 DIAGNOSIS — Z1231 Encounter for screening mammogram for malignant neoplasm of breast: Secondary | ICD-10-CM

## 2012-07-14 ENCOUNTER — Other Ambulatory Visit: Payer: Self-pay | Admitting: Internal Medicine

## 2012-07-14 DIAGNOSIS — N6310 Unspecified lump in the right breast, unspecified quadrant: Secondary | ICD-10-CM

## 2012-07-17 ENCOUNTER — Telehealth: Payer: Self-pay | Admitting: *Deleted

## 2012-07-17 NOTE — Telephone Encounter (Signed)
Pt called to this RN to state concern with new medical situation and she wanted Dr Zebedee Iba opinion.  This RN informed pt Dr Thea Silversmith is out of the office until late next week. Pt wanted to let Dr Darnelle Catalan know that she was seen by her primary MD, Dr Clelia Croft recently. She has a mass under her R arm which she noticed approximately a week ago. Maria [name] felt it was related to her history of CLL but Dr Clelia Croft feels mass may be more breast tissue related. Dr Clelia Croft has scheduled her [facility]  " I begin to think about it and wanted Dr Zebedee Iba opinion if this is the right route to proceed with ?"  This RN discussed above with pt including above protocol is what has been recommended per this office with pt's in simular situations.  No other questions at present and pt will follow up with MD as scheduled 07/24/2012.

## 2012-07-18 ENCOUNTER — Ambulatory Visit
Admission: RE | Admit: 2012-07-18 | Discharge: 2012-07-18 | Disposition: A | Discharge status: Home / Self Care | Payer: BC Managed Care – PPO | Source: Ambulatory Visit | Attending: Internal Medicine | Admitting: Internal Medicine

## 2012-07-18 ENCOUNTER — Other Ambulatory Visit: Payer: Self-pay | Admitting: Internal Medicine

## 2012-07-18 DIAGNOSIS — N6310 Unspecified lump in the right breast, unspecified quadrant: Secondary | ICD-10-CM

## 2012-07-24 ENCOUNTER — Ambulatory Visit (HOSPITAL_BASED_OUTPATIENT_CLINIC_OR_DEPARTMENT_OTHER): Payer: BC Managed Care – PPO | Admitting: Oncology

## 2012-07-24 ENCOUNTER — Other Ambulatory Visit (HOSPITAL_BASED_OUTPATIENT_CLINIC_OR_DEPARTMENT_OTHER): Payer: BC Managed Care – PPO | Admitting: Lab

## 2012-07-24 VITALS — BP 128/73 | HR 65 | Temp 98.3°F | Resp 20 | Ht 66.5 in | Wt 214.2 lb

## 2012-07-24 DIAGNOSIS — C9110 Chronic lymphocytic leukemia of B-cell type not having achieved remission: Secondary | ICD-10-CM

## 2012-07-24 LAB — CBC WITH DIFFERENTIAL/PLATELET
Basophils Absolute: 0.1 10*3/uL (ref 0.0–0.1)
Eosinophils Absolute: 0.4 10*3/uL (ref 0.0–0.5)
HGB: 14.6 g/dL (ref 11.6–15.9)
MCV: 86.7 fL (ref 79.5–101.0)
MONO#: 1.1 10*3/uL — ABNORMAL HIGH (ref 0.1–0.9)
MONO%: 16.9 % — ABNORMAL HIGH (ref 0.0–14.0)
NEUT#: 2.3 10*3/uL (ref 1.5–6.5)
RDW: 13.7 % (ref 11.2–14.5)
WBC: 6.3 10*3/uL (ref 3.9–10.3)

## 2012-07-24 NOTE — Progress Notes (Signed)
ID: [name]   DOB: [birth year]  MR#: [id number]  WUJ#:[id number]  PCP: Kari Baars, MD GYN:  SU:  OTHER MD:   HISTORY OF PRESENT ILLNESS:  INTERVAL HISTORY: Olegario Messier returns today for followup of her chronic lymphoid leukemia. Since her last visit here she noted a bump in her right axilla and she brought this to the attention of Dr. Clelia Croft, who referred her for mammography and ultrasonography, as detailed below. 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  REVIEW OF SYSTEMS: Aside from that, she has some lower back and right knee pain, which is not more frequent or intense than previously, and is long-standing. She has some mild sinus problems. She developed worsening arthralgias and myalgias Y. all Lipitor, but that resolved off Lipitor. She still having some hot flashes. A detailed review of systems was otherwise noncontributory  PAST MEDICAL HISTORY: Past Medical History  Diagnosis Date  . Allergy   . Asthma   . Cancer     chronic lymphacytic leukemia  . Diabetes mellitus   . Hyperlipidemia   . Hypertension   . Thyroid disease     hypothyroidism    PAST SURGICAL HISTORY: Past Surgical History  Procedure Date  . Abdominal hysterectomy   . Strabismus surgery   . Tonsillectomy   . Lymph node dissection     FAMILY HISTORY No family history on file.  GYNECOLOGIC HISTORY:  SOCIAL HISTORY:    ADVANCED DIRECTIVES:  HEALTH MAINTENANCE: History  Substance Use Topics  . Smoking status: Never Smoker   . Smokeless tobacco: Not on file  . Alcohol Use: Yes     Comment: occasional alcohol intake     Colonoscopy:  PAP:  Bone density:  Lipid panel:  Allergies  Allergen Reactions  . Cephalexin     REACTION: Rash  . Iohexol Swelling and Rash    Current Outpatient Prescriptions  Medication Sig Dispense Refill  . DIOVAN HCT 160-12.5 MG per tablet Take by mouth  daily.       . Fluticasone-Salmeterol (ADVAIR DISKUS) 100-50 MCG/DOSE AEPB Inhale 1 puff into the lungs every 12 (twelve) hours.        Marland Kitchen LORazepam (ATIVAN) 1 MG tablet Take 1 mg by mouth as needed.       . naproxen sodium (ANAPROX) 220 MG tablet Take 220 mg by mouth as needed.        Marland Kitchen NOVOFINE 32G X 6 MM MISC       . peg 3350 powder (MOVIPREP) 100 G SOLR moviprep as directed  1 kit  0  . SYNTHROID 88 MCG tablet Take 88 mcg by mouth daily.       Marland Kitchen VICTOZA 18 MG/3ML SOLN 1.8 mg daily        OBJECTIVE: Middle-aged white woman who appears well Filed Vitals:   07/24/12 1322  BP: 128/73  Pulse: 65  Temp: 98.3 F (36.8 C)  Resp: 20     Body mass index is 34.05 kg/(m^2).    ECOG FS: 0  Sclerae unicteric Oropharynx clear No cervical or supraclavicular adenopathy Lungs no rales or rhonchi Heart regular rate and rhythm Abd benign MSK no focal spinal tenderness, no peripheral edema Neuro: nonfocal Breasts: There is a moderate ecchymosis in the lateral right breast, but no palpable mass there or in the right axilla. There is no nipple inversion or other skin finding of concern. The left breast is unremarkable.   LAB RESULTS:  Lab Results  Component Value Date   WBC 6.3 07/24/2012   NEUTROABS 2.3 07/24/2012   HGB 14.6 07/24/2012   HCT 42.4 07/24/2012   MCV 86.7 07/24/2012   PLT 145 07/24/2012      Chemistry      Component Value Date/Time   NA 138 07/24/2010 1356   K 4.1 07/24/2010 1356   CL 105 07/24/2010 1356   CO2 26 07/24/2010 1356   BUN 13 07/24/2010 1356   CREATININE 0.75 07/24/2010 1356   GLU 101* 10/18/2009 1542      Component Value Date/Time   CALCIUM 9.2 07/24/2010 1356   ALKPHOS 81 07/24/2010 1356   AST 24 07/24/2010 1356   ALT 26 07/24/2010 1356   BILITOT 0.6 07/24/2010 1356       No results found for this basename: LABCA2    No components found with this basename: [id number]    No results found for this basename: INR:1;PROTIME:1 in the last 168  hours  Urinalysis    Component Value Date/Time   COLORURINE LT. YELLOW 01/24/2010 1017   APPEARANCEUR CLEAR 01/24/2010 1017   LABSPEC <=1.005 01/24/2010 1017   PHURINE 5.5 01/24/2010 1017   BILIRUBINUR NEGATIVE 01/24/2010 1017   KETONESUR NEGATIVE 01/24/2010 1017   UROBILINOGEN 0.2 01/24/2010 1017   NITRITE NEGATIVE 01/24/2010 1017   LEUKOCYTESUR NEGATIVE 01/24/2010 1017    STUDIES: US Breast Right  07/18/2012  *RADIOLOGY REPORT*  Clinical Data:  Palpable lump right axilla.  The patient has a history of CLL, in remission for 5 years.  DIGITAL DIAGNOSTIC RIGHT MAMMOGRAM WITH CAD AND RIGHT BREAST ULTRASOUND:  Comparison:  August 09, 2011, Jan 31, 2010  Findings:  CC and MLO views of the right breast and spot tangential view of right breast are submitted.  There are multiple enlarged lymph nodes in the right axilla, palpable area. No other suspicious abnormality is identified within the right breast.  Mammographic images were processed with CAD.  Ultrasound is performed, showing multiple abnormal enlarged lymph nodes in the right axilla with abnormal thickened cortex in the palpable area correlating to the mammographic finding.  IMPRESSION: Suspicious findings.  RECOMMENDATION: Ultrasound guided core biopsy of abnormal enlarged right axillary lymph nodes.  I have discussed the findings and recommendations with the patient. Results were also provided in writing at the conclusion of the visit.  BI-RADS CATEGORY 4:  Suspicious abnormality - biopsy should be considered.   Original Report Authenticated By: Sherian Rein, M.D.    [place] Core Biopsy  07/22/2012  **ADDENDUM** CREATED: 07/22/2012 14:25:45  The patient was called by myself Dr. Micheline Maze.  The patient states she is doing well since her right axillary node biopsy with minimal tenderness at the biopsy site.  The patient was given the results of her biopsy which was compatible with chronic lymphocytic leukemia.  This is compatible with the patient's history  and imaging findings.  Patient is scheduled to see her oncologist, Dr. Darnelle Catalan on Thursday 07/24/2012.  **END ADDENDUM** SIGNED BY: Melton Alar. Micheline Maze, M.D.   07/18/2012  *RADIOLOGY REPORT*  Clinical Data:  Abnormal right axillary lymph nodes.  ULTRASOUND GUIDED CORE BIOPSY OF THE right BREAST  Comparison: Previous exams.  I met with the patient and we discussed the procedure of ultrasound- guided biopsy, including benefits and alternatives.  We discussed the high likelihood of a successful procedure. We discussed the risks of the procedure, including infection, bleeding, tissue injury, clip migration, and inadequate sampling.  Informed written consent was given.  Using sterile  technique  lidocaine, ultrasound guidance and a 14 gauge automated biopsy device, biopsy was performed of abnormal right axillary lymph node.  No tissue marker clip was deployed into the biopsy cavity.  IMPRESSION: Ultrasound guided biopsy of right axilla.  No apparent complications.  Original Report Authenticated By: Sherian Rein, M.D.    Mm Digital Diagnostic Unilat R  07/18/2012  *RADIOLOGY REPORT*  Clinical Data:  Palpable lump right axilla.  The patient has a history of CLL, in remission for 5 years.  DIGITAL DIAGNOSTIC RIGHT MAMMOGRAM WITH CAD AND RIGHT BREAST ULTRASOUND:  Comparison:  August 09, 2011, Jan 31, 2010  Findings:  CC and MLO views of the right breast and spot tangential view of right breast are submitted.  There are multiple enlarged lymph nodes in the right axilla, palpable area. No other suspicious abnormality is identified within the right breast.  Mammographic images were processed with CAD.  Ultrasound is performed, showing multiple abnormal enlarged lymph nodes in the right axilla with abnormal thickened cortex in the palpable area correlating to the mammographic finding.  IMPRESSION: Suspicious findings.  RECOMMENDATION: Ultrasound guided core biopsy of abnormal enlarged right axillary lymph nodes.  I have  discussed the findings and recommendations with the patient. Results were also provided in writing at the conclusion of the visit.  BI-RADS CATEGORY 4:  Suspicious abnormality - biopsy should be considered.   Original Report Authenticated By: Sherian Rein, M.D.    Patient: JOVI, ALVIZO Collected: 07/18/2012 Client: [facility] Accession: [id number] Received: 07/18/2012 Sherian Rein, MD DOB: October 28, 1950 Age: 33 Gender: F Reported: 07/22/2012 [address] Patient Ph: [phone] MRN #: [id number] [place], [place] [address] Client Acc#: Chart #: [id number] Phone: [phone] Fax: CC: Donnie Mesa, MD REPORT OF SURGICAL PATHOLOGY FINAL DIAGNOSIS Diagnosis Lymph node, needle/core biopsy, right axilla - SMALL LYMPHOCYTIC LYMPHOMA / CHRONIC LYMPHOCYTIC LEUKEMIA. - SEE ONCOLOGY TABLE. Microscopic Comment LYMPHOMA Histologic type: Non-Hodgkin's lymphoma, small lymphocytic type Grade (if applicable): Low grade Flow cytometry: No material is available for analysis Immunohistochemical stains: CD20; CD79a; CD3; CD43; CD5; CD10; Cyclin-D1 with appropriate controls Touch preps/imprints: Not done. Comments: The sections show needle core biopsies of lymphoid tissue displaying effacement of the architecture by a predominant population of small round to irregular lymphoid cells with dense chromatin and inconspicuous nucleoli. This is associated with scattering of variably sized proliferation centers. Definite follicular structures are not present. Scattered variably sized clusters of epithelioid histiocytes are also seen. Fresh material is not available for flow cytometric analysis. Hence, immunohistochemical stains were performed and show that there is predominance of B cells as seen with CD79a and CD20 with associated co-expression of CD5 and CD43 in B-cell areas. No CD10 or Cyclin D1 positivity is identified. There is an admixed minor component of T cells as seen with CD3 stain.  The overall features are compatible with involvement by small lymphocytic lymphoma/chronic lymphocytic leukemia. 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 She understands she has a chronic illness that is currently not curable (short of allogeneic transplantation),  but that we have many treatments, several of which are minimally burdensome, none of which however would affect her long-term survival if given in the absence of specific indications just discussed.  Accordingly we are continuing observation. She sees Dr. Clelia Croft about every 4 months with lab work, and will see me again in one year. She knows to call for any problems that may develop before that date.   Sinahi Knights C    07/24/2012

## 2012-08-02 ENCOUNTER — Other Ambulatory Visit: Payer: Self-pay | Admitting: Oncology

## 2013-03-31 ENCOUNTER — Other Ambulatory Visit: Payer: Self-pay | Admitting: *Deleted

## 2013-04-11 ENCOUNTER — Encounter: Payer: Self-pay | Admitting: Oncology

## 2013-04-29 ENCOUNTER — Other Ambulatory Visit: Payer: Self-pay | Admitting: Emergency Medicine

## 2013-04-30 ENCOUNTER — Other Ambulatory Visit: Payer: Self-pay | Admitting: Emergency Medicine

## 2013-05-03 ENCOUNTER — Telehealth: Payer: Self-pay | Admitting: Oncology

## 2013-05-03 NOTE — Telephone Encounter (Signed)
S/w pt re appts for 11/13

## 2013-05-05 ENCOUNTER — Other Ambulatory Visit: Payer: Self-pay | Admitting: *Deleted

## 2013-07-22 ENCOUNTER — Other Ambulatory Visit: Payer: Self-pay | Admitting: Physician Assistant

## 2013-07-22 DIAGNOSIS — C9110 Chronic lymphocytic leukemia of B-cell type not having achieved remission: Secondary | ICD-10-CM

## 2013-07-23 ENCOUNTER — Telehealth: Payer: Self-pay | Admitting: Oncology

## 2013-07-23 ENCOUNTER — Other Ambulatory Visit (HOSPITAL_BASED_OUTPATIENT_CLINIC_OR_DEPARTMENT_OTHER): Payer: BC Managed Care – PPO | Admitting: Lab

## 2013-07-23 ENCOUNTER — Ambulatory Visit (HOSPITAL_BASED_OUTPATIENT_CLINIC_OR_DEPARTMENT_OTHER): Payer: BC Managed Care – PPO | Admitting: Oncology

## 2013-07-23 ENCOUNTER — Encounter (INDEPENDENT_AMBULATORY_CARE_PROVIDER_SITE_OTHER): Payer: Self-pay

## 2013-07-23 ENCOUNTER — Other Ambulatory Visit: Payer: Self-pay | Admitting: Oncology

## 2013-07-23 VITALS — BP 118/71 | HR 78 | Temp 98.0°F | Resp 18 | Ht 66.5 in | Wt 205.0 lb

## 2013-07-23 DIAGNOSIS — Z1231 Encounter for screening mammogram for malignant neoplasm of breast: Secondary | ICD-10-CM

## 2013-07-23 DIAGNOSIS — C9110 Chronic lymphocytic leukemia of B-cell type not having achieved remission: Secondary | ICD-10-CM

## 2013-07-23 LAB — MANUAL DIFFERENTIAL
ALC: 25.9 10*3/uL — ABNORMAL HIGH (ref 0.9–3.3)
Band Neutrophils: 0 % (ref 0–10)
EOS: 4 % (ref 0–7)
LYMPH: 85 % — ABNORMAL HIGH (ref 14–49)
MONO: 4 % (ref 0–14)
Other Cell: 0 % (ref 0–0)
RBC Comments: NORMAL
SEG: 7 % — ABNORMAL LOW (ref 38–77)
Variant Lymph: 0 % (ref 0–0)
nRBC: 0 % (ref 0–0)

## 2013-07-23 LAB — CBC WITH DIFFERENTIAL/PLATELET
HCT: 43 % (ref 34.8–46.6)
HGB: 14.1 g/dL (ref 11.6–15.9)
MCH: 29.7 pg (ref 25.1–34.0)
MCV: 90.7 fL (ref 79.5–101.0)
Platelets: 152 10*3/uL (ref 145–400)
RDW: 14.7 % — ABNORMAL HIGH (ref 11.2–14.5)

## 2013-07-23 NOTE — Progress Notes (Signed)
ID: Maria [name]   DOB: May 06, 1951  MR#: [id number]  WUJ#:[id number]  PCP: Maria Baars, MD GYN:  SU:  OTHER MD:   HISTORY OF PRESENT ILLNESS: Maria [name] developed what she thought was "the flu" in December of 2002. She noted a large lymph node developing in her right anterior cervical area. 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 She is now working 8 hour shifts instead of 12, but it is 5 days a week instead of 4, so there are pros and cons. She has many friends and at [facility] area and does like her job. Her sister, might former patient Maria [name], is doing well  REVIEW OF SYSTEMS: Maria [name] sleeps poorly. She describes herself as mildly fatigued. She has significant pain in her right knee, worse with weightbearing. She keeps a dry cough and some sinus symptoms including a runny nose and intermittent hoarseness this time of year. Sometimes her ankles swell. She had a urinary tract infection within the last 3 months. She has some hot flashes but she has not had significant weight loss, significant unexplained fatigue, fevers, drenching sweats, or disfiguring adenopathy. A detailed review of systems today was otherwise unremarkable  PAST MEDICAL HISTORY: Past Medical History  Diagnosis Date  . Allergy   . Asthma   . Cancer     chronic lymphacytic leukemia  . Diabetes mellitus   . Hyperlipidemia   . Hypertension   . Thyroid disease     hypothyroidism    PAST SURGICAL HISTORY: Past Surgical History  Procedure Laterality Date  . Abdominal hysterectomy    . Strabismus surgery    . Tonsillectomy    . Lymph node dissection      FAMILY HISTORY No family history on file. The patient's father died at the age of 83 from a myocardial infarction. The patient's  mother died at the age of 46 from primary liver carcinoma. The patient had no brothers. Her one sister, Maria [name], has a history of breast cancer  GYNECOLOGIC HISTORY: Menarche at around age 62. The patient is GX P0. She underwent simple hysterectomy without salpingo-oophorectomy in 1997   SOCIAL HISTORY: RN at [employer], working in the OR, 8h/d x 5d/week. She lives by herself with her caps Maria [name] and Maria [name]. She attends a [employer]   ADVANCED DIRECTIVES: Not in place  HEALTH MAINTENANCE: History  Substance Use Topics  . Smoking status: Never Smoker   . Smokeless tobacco: Not on file  . Alcohol Use: Yes     Comment: occasional alcohol intake     Colonoscopy:  PAP:  Bone density:  Lipid panel:  Allergies  Allergen Reactions  . Cephalexin     REACTION: Rash  . Iohexol Swelling and Rash    Current Outpatient Prescriptions  Medication Sig Dispense Refill  . DIOVAN HCT 160-12.5 MG per tablet Take by mouth daily.       . Fluticasone-Salmeterol (ADVAIR DISKUS) 100-50 MCG/DOSE AEPB Inhale 1 puff into the lungs every 12 (twelve) hours.        Marland Kitchen LORazepam (ATIVAN) 1 MG tablet Take 1 mg by mouth as needed.       . naproxen sodium (ANAPROX) 220 MG tablet Take 220 mg by mouth as needed.        Marland Kitchen NOVOFINE 32G X 6  MM MISC       . peg 3350 powder (MOVIPREP) 100 G SOLR moviprep as directed  1 kit  0  . SYNTHROID 88 MCG tablet Take 88 mcg by mouth daily.       Marland Kitchen VICTOZA 18 MG/3ML SOLN 1.8 mg daily       No current facility-administered medications for this visit.    OBJECTIVE: 62-year-old white woman in no acute distress Filed Vitals:   07/23/13 1341  BP: 118/71  Pulse: 78  Temp: 98 F (36.7 C)  Resp: 18     Body mass index is 32.6 kg/(m^2).    ECOG FS: 1  Sclerae unicteric, pupils equal and reactive Oropharynx clear and moist Lower cervical and supraclavicular adenopathy noted bilaterally, the largest lymph node measuring approximately 1-1/2-2 cm; bilateral axillary  adenopathy noted, maximally 2-2-1/2 cm, not fixed, nontender Lungs no rales or rhonchi, with good excursion Heart regular rate and rhythm, no murmur appreciated Abd soft, nontender, positive bowel sounds, no splenomegaly palpated MSK no focal spinal tenderness, no peripheral edema Neuro: nonfocal, well oriented, appropriate affect Breasts: Deferred   LAB RESULTS: Lab Results  Component Value Date   WBC 30.5* 07/23/2013   NEUTROABS 2.3 07/24/2012   HGB 14.1 07/23/2013   HCT 43.0 07/23/2013   MCV 90.7 07/23/2013   PLT 152 07/23/2013      Chemistry      Component Value Date/Time   NA 138 07/24/2010 1356   K 4.1 07/24/2010 1356   CL 105 07/24/2010 1356   CO2 26 07/24/2010 1356   BUN 13 07/24/2010 1356   CREATININE 0.75 07/24/2010 1356   GLU 101* 10/18/2009 1542      Component Value Date/Time   CALCIUM 9.2 07/24/2010 1356   ALKPHOS 81 07/24/2010 1356   AST 24 07/24/2010 1356   ALT 26 07/24/2010 1356   BILITOT 0.6 07/24/2010 1356       No results found for this basename: LABCA2    No components found with this basename: [id number]    No results found for this basename: INR,  in the last 168 hours  Urinalysis    Component Value Date/Time   COLORURINE LT. YELLOW 01/24/2010 1017   APPEARANCEUR CLEAR 01/24/2010 1017   LABSPEC <=1.005 01/24/2010 1017   PHURINE 5.5 01/24/2010 1017   BILIRUBINUR NEGATIVE 01/24/2010 1017   KETONESUR NEGATIVE 01/24/2010 1017   UROBILINOGEN 0.2 01/24/2010 1017   NITRITE NEGATIVE 01/24/2010 1017   LEUKOCYTESUR NEGATIVE 01/24/2010 1017    STUDIES: No results found.  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 There was no CD10 or cyclin D1 positivity identified  PLAN: She is now 6 years out from her most recent therapy. There is no indication for treatment and we reviewed those again today. She understands that if her adenopathy becomes more trouble some for disfiguring, we can certainly initiate treatment and likely in her case I would use ibrutinib, which can be very effective add resolving the adenopathy (while the antibodies like rituximab do a better job with  the "liquid" portion of the disease).  After much discussion today will after that we  will be checking her complete blood count on an every 3 month basis. She will make sure I get any lab work from any other doctor visits and she will see me again in 1 year barring the development of significant "B." symptoms.Marland Kitchen   Maria [name] C    07/23/2013

## 2013-07-23 NOTE — Telephone Encounter (Signed)
, °

## 2013-08-07 ENCOUNTER — Ambulatory Visit
Admission: RE | Admit: 2013-08-07 | Discharge: 2013-08-07 | Disposition: A | Discharge status: Home / Self Care | Payer: BC Managed Care – PPO | Source: Ambulatory Visit | Attending: Oncology | Admitting: Oncology

## 2013-08-07 DIAGNOSIS — Z1231 Encounter for screening mammogram for malignant neoplasm of breast: Secondary | ICD-10-CM

## 2013-09-21 ENCOUNTER — Other Ambulatory Visit: Payer: Self-pay | Admitting: Physician Assistant

## 2013-09-21 DIAGNOSIS — C9110 Chronic lymphocytic leukemia of B-cell type not having achieved remission: Secondary | ICD-10-CM

## 2013-09-22 ENCOUNTER — Encounter: Payer: Self-pay | Admitting: Internal Medicine

## 2013-09-22 ENCOUNTER — Other Ambulatory Visit (HOSPITAL_BASED_OUTPATIENT_CLINIC_OR_DEPARTMENT_OTHER): Payer: BC Managed Care – PPO

## 2013-09-22 DIAGNOSIS — C9110 Chronic lymphocytic leukemia of B-cell type not having achieved remission: Secondary | ICD-10-CM

## 2013-09-22 LAB — CBC WITH DIFFERENTIAL/PLATELET
BASO%: 0.9 % (ref 0.0–2.0)
BASOS ABS: 0.3 10*3/uL — AB (ref 0.0–0.1)
EOS%: 1.3 % (ref 0.0–7.0)
Eosinophils Absolute: 0.4 10*3/uL (ref 0.0–0.5)
HEMATOCRIT: 40.7 % (ref 34.8–46.6)
HEMOGLOBIN: 13.3 g/dL (ref 11.6–15.9)
LYMPH#: 27.3 10*3/uL — AB (ref 0.9–3.3)
LYMPH%: 81.6 % — ABNORMAL HIGH (ref 14.0–49.7)
MCH: 30.3 pg (ref 25.1–34.0)
MCHC: 32.6 g/dL (ref 31.5–36.0)
MCV: 93.1 fL (ref 79.5–101.0)
MONO#: 1.8 10*3/uL — AB (ref 0.1–0.9)
MONO%: 5.5 % (ref 0.0–14.0)
NEUT%: 10.7 % — AB (ref 38.4–76.8)
NEUTROS ABS: 3.6 10*3/uL (ref 1.5–6.5)
Platelets: 146 10*3/uL (ref 145–400)
RBC: 4.37 10*6/uL (ref 3.70–5.45)
RDW: 15.3 % — ABNORMAL HIGH (ref 11.2–14.5)
WBC: 33.5 10*3/uL — AB (ref 3.9–10.3)

## 2013-09-22 LAB — CHCC SMEAR

## 2013-09-22 LAB — MORPHOLOGY
PLT EST: ADEQUATE
RBC Comments: NORMAL

## 2013-09-22 NOTE — Progress Notes (Signed)
Put fmla form on nurse's desk °

## 2013-09-28 ENCOUNTER — Encounter: Payer: Self-pay | Admitting: Oncology

## 2013-09-28 NOTE — Progress Notes (Signed)
Faxed fmla form to [facility] @ [phone] °

## 2013-12-21 ENCOUNTER — Other Ambulatory Visit: Payer: Self-pay | Admitting: *Deleted

## 2013-12-21 DIAGNOSIS — C9110 Chronic lymphocytic leukemia of B-cell type not having achieved remission: Secondary | ICD-10-CM

## 2013-12-22 ENCOUNTER — Other Ambulatory Visit (HOSPITAL_BASED_OUTPATIENT_CLINIC_OR_DEPARTMENT_OTHER): Payer: BC Managed Care – PPO

## 2013-12-22 DIAGNOSIS — C9110 Chronic lymphocytic leukemia of B-cell type not having achieved remission: Secondary | ICD-10-CM

## 2013-12-22 LAB — COMPREHENSIVE METABOLIC PANEL (CC13)
ALBUMIN: 3.7 g/dL (ref 3.5–5.0)
ALK PHOS: 221 U/L — AB (ref 40–150)
ALT: 26 U/L (ref 0–55)
AST: 39 U/L — AB (ref 5–34)
Anion Gap: 8 mEq/L (ref 3–11)
BUN: 16.7 mg/dL (ref 7.0–26.0)
CO2: 24 mEq/L (ref 22–29)
Calcium: 9.4 mg/dL (ref 8.4–10.4)
Chloride: 109 mEq/L (ref 98–109)
Creatinine: 0.8 mg/dL (ref 0.6–1.1)
Glucose: 141 mg/dl — ABNORMAL HIGH (ref 70–140)
POTASSIUM: 4.3 meq/L (ref 3.5–5.1)
SODIUM: 142 meq/L (ref 136–145)
TOTAL PROTEIN: 6.4 g/dL (ref 6.4–8.3)
Total Bilirubin: 0.68 mg/dL (ref 0.20–1.20)

## 2013-12-22 LAB — CHCC SMEAR

## 2013-12-22 LAB — CBC WITH DIFFERENTIAL/PLATELET
BASO%: 0.4 % (ref 0.0–2.0)
Basophils Absolute: 0.2 10*3/uL — ABNORMAL HIGH (ref 0.0–0.1)
EOS ABS: 0.5 10*3/uL (ref 0.0–0.5)
EOS%: 0.9 % (ref 0.0–7.0)
HCT: 40 % (ref 34.8–46.6)
HGB: 13 g/dL (ref 11.6–15.9)
LYMPH%: 90.6 % — ABNORMAL HIGH (ref 14.0–49.7)
MCH: 30.4 pg (ref 25.1–34.0)
MCHC: 32.6 g/dL (ref 31.5–36.0)
MCV: 93.5 fL (ref 79.5–101.0)
MONO#: 0.4 10*3/uL (ref 0.1–0.9)
MONO%: 0.9 % (ref 0.0–14.0)
NEUT%: 7.2 % — ABNORMAL LOW (ref 38.4–76.8)
NEUTROS ABS: 3.6 10*3/uL (ref 1.5–6.5)
Platelets: 147 10*3/uL (ref 145–400)
RBC: 4.28 10*6/uL (ref 3.70–5.45)
RDW: 14.7 % — AB (ref 11.2–14.5)
WBC: 49.7 10*3/uL — ABNORMAL HIGH (ref 3.9–10.3)
lymph#: 45 10*3/uL — ABNORMAL HIGH (ref 0.9–3.3)

## 2013-12-22 LAB — MORPHOLOGY
PLT EST: ADEQUATE
RBC Comments: NORMAL

## 2014-03-23 ENCOUNTER — Other Ambulatory Visit: Payer: BC Managed Care – PPO

## 2014-03-26 ENCOUNTER — Encounter: Payer: Self-pay | Admitting: Oncology

## 2014-03-26 NOTE — Progress Notes (Signed)
Faxed fmla form to [facility] @ [phone] °

## 2014-05-19 ENCOUNTER — Ambulatory Visit (AMBULATORY_SURGERY_CENTER): Payer: Self-pay

## 2014-05-19 VITALS — Ht 66.0 in | Wt 185.6 lb

## 2014-05-19 DIAGNOSIS — Z1211 Encounter for screening for malignant neoplasm of colon: Secondary | ICD-10-CM

## 2014-05-19 MED ORDER — MOVIPREP 100 G PO SOLR: 1.0000 | Freq: Once | ORAL | Status: DC

## 2014-05-19 NOTE — Progress Notes (Signed)
No allergies to eggs or soy No past problems with anesthesia No home oxygen No diet/weight loss meds  Has email  Emmi instructions given for colonoscopy 

## 2014-06-01 ENCOUNTER — Encounter: Payer: Self-pay | Admitting: Gastroenterology

## 2014-06-01 ENCOUNTER — Ambulatory Visit (AMBULATORY_SURGERY_CENTER): Payer: BC Managed Care – PPO | Admitting: Gastroenterology

## 2014-06-01 VITALS — BP 110/40 | HR 66 | Temp 97.8°F | Resp 13 | Ht 66.0 in | Wt 185.0 lb

## 2014-06-01 DIAGNOSIS — D125 Benign neoplasm of sigmoid colon: Secondary | ICD-10-CM

## 2014-06-01 DIAGNOSIS — D123 Benign neoplasm of transverse colon: Secondary | ICD-10-CM

## 2014-06-01 DIAGNOSIS — D126 Benign neoplasm of colon, unspecified: Secondary | ICD-10-CM

## 2014-06-01 DIAGNOSIS — Z1211 Encounter for screening for malignant neoplasm of colon: Secondary | ICD-10-CM

## 2014-06-01 MED ORDER — SODIUM CHLORIDE 0.9 % IV SOLN: 500.0000 mL | INTRAVENOUS | Status: DC

## 2014-06-01 NOTE — Progress Notes (Signed)
Pt stable to RR 

## 2014-06-01 NOTE — Patient Instructions (Signed)
Impressions/recommendations:  Polyps (handout given) diverticulosis (handout given) Hemorrhoids (handout given)  Repeat colonoscopy pending pathology results. High fiber diet (handout given)  YOU HAD AN ENDOSCOPIC PROCEDURE TODAY AT [place]: Refer to the procedure report that was given to you for any specific questions about what was found during the examination.  If the procedure report does not answer your questions, please call your gastroenterologist to clarify.  If you requested that your care partner not be given the details of your procedure findings, then the procedure report has been included in a sealed envelope for you to review at your convenience later.  YOU SHOULD EXPECT: Some feelings of bloating in the abdomen. Passage of more gas than usual.  Walking can help get rid of the air that was put into your GI tract during the procedure and reduce the bloating. If you had a lower endoscopy (such as a colonoscopy or flexible sigmoidoscopy) you may notice spotting of blood in your stool or on the toilet paper. If you underwent a bowel prep for your procedure, then you may not have a normal bowel movement for a few days.  DIET: Your first meal following the procedure should be a light meal and then it is ok to progress to your normal diet.  A half-sandwich or bowl of soup is an example of a good first meal.  Heavy or fried foods are harder to digest and may make you feel nauseous or bloated.  Likewise meals heavy in dairy and vegetables can cause extra gas to form and this can also increase the bloating.  Drink plenty of fluids but you should avoid alcoholic beverages for 24 hours.  ACTIVITY: Your care partner should take you home directly after the procedure.  You should plan to take it easy, moving slowly for the rest of the day.  You can resume normal activity the day after the procedure however you should NOT DRIVE or use heavy machinery for 24 hours (because of the  sedation medicines used during the test).    SYMPTOMS TO REPORT IMMEDIATELY: A gastroenterologist can be reached at any hour.  During normal business hours, 8:30 AM to 5:00 PM Monday through Friday, call [phone].  After hours and on weekends, please call the GI answering service at [phone] who will take a message and have the physician on call contact you.   Following lower endoscopy (colonoscopy or flexible sigmoidoscopy):  Excessive amounts of blood in the stool  Significant tenderness or worsening of abdominal pains  Swelling of the abdomen that is new, acute  Fever of 100F or higher  FOLLOW UP: If any biopsies were taken you will be contacted by phone or by letter within the next 1-3 weeks.  Call your gastroenterologist if you have not heard about the biopsies in 3 weeks.  Our staff will call the home number listed on your records the next business day following your procedure to check on you and address any questions or concerns that you may have at that time regarding the information given to you following your procedure. This is a courtesy call and so if there is no answer at the home number and we have not heard from you through the emergency physician on call, we will assume that you have returned to your regular daily activities without incident.  SIGNATURES/CONFIDENTIALITY: You and/or your care partner have signed paperwork which will be entered into your electronic medical record.  These signatures attest to the fact that  that the information above on your After Visit Summary has been reviewed and is understood.  Full responsibility of the confidentiality of this discharge information lies with you and/or your care-partner.

## 2014-06-01 NOTE — Op Note (Signed)
[facility]  [address] [place], [id number]   COLONOSCOPY PROCEDURE REPORT  PATIENT: Maria [name], Maria [name]  MR#: [id number] BIRTHDATE: 03-Dec-1950 , 63  yrs. old GENDER: female ENDOSCOPIST: Ladene Artist, MD, [facility] REFERRED JJ:HERD, Katherina Right PROCEDURE DATE:  06/01/2014 PROCEDURE:   Colonoscopy with biopsy and Colonoscopy with snare polypectomy First Screening Colonoscopy - Avg.  risk and is 50 yrs.  old or older Yes.  Prior Negative Screening - Now for repeat screening. N/A  History of Adenoma - Now for follow-up colonoscopy & has been > or = to 3 yrs.  N/A  Polyps Removed Today? Yes. ASA CLASS:   Class II INDICATIONS:average risk for colorectal cancer. MEDICATIONS: Monitored anesthesia care and Propofol 200 mg DESCRIPTION OF PROCEDURE:   After the risks benefits and alternatives of the procedure were thoroughly explained, informed consent was obtained. Digital exam  revealed no abnormalities of the rectum.   The LB [id number]-H190 [id number]  endoscope was introduced through the anus and advanced to the cecum, which was identified by both the appendix and ileocecal valve. No adverse events experienced.   The quality of the prep was excellent, using MoviPrep  The instrument was then slowly withdrawn as the colon was fully examined.   COLON FINDINGS: A semi-pedunculated polyp measuring 7 mm in size was found in the transverse colon.  A polypectomy was performed with a cold snare.  The resection was complete, the polyp tissue was completely retrieved and sent to histology.   A sessile polyp measuring 3 mm in size was found in the sigmoid colon.  A polypectomy was performed with cold forceps.  The resection was complete, the polyp tissue was completely retrieved and sent to histology.   There was mild diverticulosis noted in the sigmoid colon.   The colon mucosa was otherwise normal.  Retroflexed views revealed internal Grade I hemorrhoids. The time to cecum=1  minutes 52 seconds.  Withdrawal time=11 minutes 12 seconds.  The scope was withdrawn and the procedure completed.  COMPLICATIONS: There were no complications.  ENDOSCOPIC IMPRESSION: 1.   Semi-pedunculated polyp in the transverse colon; polypectomy performed with a cold snare 2.   Sessile polyp in the sigmoid colon; polypectomy performed with cold forceps 3.   Mild diverticulosis in the sigmoid colon 4.   Grade I internal hemorrhoids  RECOMMENDATIONS: 1.  Await pathology results 2.  Repeat colonoscopy in 5 years if polyp(s) adenomatous; otherwise 10 years 3.  High fiber diet with liberal fluid intake.  eSigned:  Ladene Artist, MD, [facility] 06/01/2014 11:49 AM     PATIENT NAME:  Maria [name], Maria [name] MR#: [id number]

## 2014-06-01 NOTE — Progress Notes (Signed)
Called to room to assist during endoscopic procedure.  Patient ID and intended procedure confirmed with present staff. Received instructions for my participation in the procedure from the performing physician.  

## 2014-06-02 ENCOUNTER — Telehealth: Payer: Self-pay | Admitting: *Deleted

## 2014-06-02 NOTE — Telephone Encounter (Signed)
  Follow up Call-  Call back number 06/01/2014  Post procedure Call Back phone  # [phone]  Permission to leave phone message Yes     No answer, left message.

## 2014-06-07 ENCOUNTER — Encounter: Payer: Self-pay | Admitting: Gastroenterology

## 2014-06-22 ENCOUNTER — Other Ambulatory Visit (HOSPITAL_BASED_OUTPATIENT_CLINIC_OR_DEPARTMENT_OTHER): Payer: BC Managed Care – PPO

## 2014-06-22 ENCOUNTER — Other Ambulatory Visit: Payer: Self-pay | Admitting: *Deleted

## 2014-06-22 ENCOUNTER — Other Ambulatory Visit: Payer: Self-pay | Admitting: Oncology

## 2014-06-22 DIAGNOSIS — C50912 Malignant neoplasm of unspecified site of left female breast: Secondary | ICD-10-CM

## 2014-06-22 DIAGNOSIS — C257 Malignant neoplasm of other parts of pancreas: Secondary | ICD-10-CM

## 2014-06-22 LAB — COMPREHENSIVE METABOLIC PANEL (CC13)
ALT: 15 U/L (ref 0–55)
AST: 28 U/L (ref 5–34)
Albumin: 3.5 g/dL (ref 3.5–5.0)
Alkaline Phosphatase: 207 U/L — ABNORMAL HIGH (ref 40–150)
Anion Gap: 10 mEq/L (ref 3–11)
BILIRUBIN TOTAL: 0.76 mg/dL (ref 0.20–1.20)
BUN: 21.6 mg/dL (ref 7.0–26.0)
CHLORIDE: 107 meq/L (ref 98–109)
CO2: 24 mEq/L (ref 22–29)
CREATININE: 1.1 mg/dL (ref 0.6–1.1)
Calcium: 9.4 mg/dL (ref 8.4–10.4)
Glucose: 131 mg/dl (ref 70–140)
Potassium: 4.5 mEq/L (ref 3.5–5.1)
Sodium: 141 mEq/L (ref 136–145)
Total Protein: 6.2 g/dL — ABNORMAL LOW (ref 6.4–8.3)

## 2014-06-22 LAB — CBC WITH DIFFERENTIAL/PLATELET
BASO%: 0.8 % (ref 0.0–2.0)
Basophils Absolute: 0.8 10*3/uL — ABNORMAL HIGH (ref 0.0–0.1)
EOS%: 0.4 % (ref 0.0–7.0)
Eosinophils Absolute: 0.4 10*3/uL (ref 0.0–0.5)
HCT: 38.9 % (ref 34.8–46.6)
HGB: 12 g/dL (ref 11.6–15.9)
LYMPH#: 96.3 10*3/uL — AB (ref 0.9–3.3)
LYMPH%: 92.9 % — AB (ref 14.0–49.7)
MCH: 29.9 pg (ref 25.1–34.0)
MCHC: 31 g/dL — AB (ref 31.5–36.0)
MCV: 96.6 fL (ref 79.5–101.0)
MONO#: 0.9 10*3/uL (ref 0.1–0.9)
MONO%: 0.9 % (ref 0.0–14.0)
NEUT#: 5.2 10*3/uL (ref 1.5–6.5)
NEUT%: 5 % — ABNORMAL LOW (ref 38.4–76.8)
Platelets: 164 10*3/uL (ref 145–400)
RBC: 4.03 10*6/uL (ref 3.70–5.45)
RDW: 15.3 % — ABNORMAL HIGH (ref 11.2–14.5)
WBC: 103.6 10*3/uL (ref 3.9–10.3)

## 2014-06-22 LAB — TECHNOLOGIST REVIEW

## 2014-07-19 ENCOUNTER — Other Ambulatory Visit: Payer: Self-pay | Admitting: Emergency Medicine

## 2014-07-19 DIAGNOSIS — C9110 Chronic lymphocytic leukemia of B-cell type not having achieved remission: Secondary | ICD-10-CM

## 2014-07-20 ENCOUNTER — Telehealth: Payer: Self-pay | Admitting: Oncology

## 2014-07-20 ENCOUNTER — Ambulatory Visit (HOSPITAL_BASED_OUTPATIENT_CLINIC_OR_DEPARTMENT_OTHER): Payer: BC Managed Care – PPO | Admitting: Oncology

## 2014-07-20 ENCOUNTER — Other Ambulatory Visit (HOSPITAL_BASED_OUTPATIENT_CLINIC_OR_DEPARTMENT_OTHER): Payer: BC Managed Care – PPO

## 2014-07-20 VITALS — BP 138/56 | HR 76 | Temp 98.2°F | Resp 18 | Ht 66.0 in | Wt 195.6 lb

## 2014-07-20 DIAGNOSIS — C9110 Chronic lymphocytic leukemia of B-cell type not having achieved remission: Secondary | ICD-10-CM

## 2014-07-20 DIAGNOSIS — C8359 Lymphoblastic (diffuse) lymphoma, extranodal and solid organ sites: Secondary | ICD-10-CM

## 2014-07-20 LAB — CBC WITH DIFFERENTIAL/PLATELET
BASO%: 0.9 % (ref 0.0–2.0)
Basophils Absolute: 1 10*3/uL — ABNORMAL HIGH (ref 0.0–0.1)
EOS%: 0.4 % (ref 0.0–7.0)
Eosinophils Absolute: 0.5 10*3/uL (ref 0.0–0.5)
HCT: 39.9 % (ref 34.8–46.6)
HGB: 12.1 g/dL (ref 11.6–15.9)
LYMPH#: 109 10*3/uL — AB (ref 0.9–3.3)
LYMPH%: 92.8 % — AB (ref 14.0–49.7)
MCH: 29.4 pg (ref 25.1–34.0)
MCHC: 30.5 g/dL — AB (ref 31.5–36.0)
MCV: 96.5 fL (ref 79.5–101.0)
MONO#: 2.2 10*3/uL — AB (ref 0.1–0.9)
MONO%: 1.9 % (ref 0.0–14.0)
NEUT#: 4.6 10*3/uL (ref 1.5–6.5)
NEUT%: 4 % — ABNORMAL LOW (ref 38.4–76.8)
Platelets: 171 10*3/uL (ref 145–400)
RBC: 4.13 10*6/uL (ref 3.70–5.45)
RDW: 15.3 % — ABNORMAL HIGH (ref 11.2–14.5)
WBC: 117.4 10*3/uL — AB (ref 3.9–10.3)

## 2014-07-20 LAB — COMPREHENSIVE METABOLIC PANEL (CC13)
ALBUMIN: 3.7 g/dL (ref 3.5–5.0)
ALT: 14 U/L (ref 0–55)
AST: 31 U/L (ref 5–34)
Alkaline Phosphatase: 170 U/L — ABNORMAL HIGH (ref 40–150)
Anion Gap: 8 mEq/L (ref 3–11)
BILIRUBIN TOTAL: 0.88 mg/dL (ref 0.20–1.20)
BUN: 23.3 mg/dL (ref 7.0–26.0)
CALCIUM: 9.9 mg/dL (ref 8.4–10.4)
CHLORIDE: 103 meq/L (ref 98–109)
CO2: 28 mEq/L (ref 22–29)
Creatinine: 1.1 mg/dL (ref 0.6–1.1)
Glucose: 101 mg/dl (ref 70–140)
Potassium: 4.1 mEq/L (ref 3.5–5.1)
Sodium: 138 mEq/L (ref 136–145)
TOTAL PROTEIN: 6.3 g/dL — AB (ref 6.4–8.3)

## 2014-07-20 LAB — TECHNOLOGIST REVIEW

## 2014-07-20 MED ORDER — IBRUTINIB 140 MG PO CAPS: 420.0000 mg | ORAL_CAPSULE | Freq: Every day | ORAL | Status: DC

## 2014-07-20 MED ORDER — ALLOPURINOL 300 MG PO TABS: 300.0000 mg | ORAL_TABLET | Freq: Every day | ORAL | Status: DC

## 2014-07-20 NOTE — Telephone Encounter (Signed)
er pof to sch pt appt-gave to Webb Silversmith to override-pt aware of time & date of appt-adv PET to be sch by CS

## 2014-07-20 NOTE — Progress Notes (Signed)
ID: Maria [name]   DOB: [birth year]  MR#: [id number]  SNK#:[id number]  PCP: Marton Redwood, MD GYN:  SU:  OTHER MD:   HISTORY OF PRESENT ILLNESS: From the original intake note:  Maria [name] developed what she thought was "the flu" in December of 2002. She noted a large lymph node developing in her right anterior cervical area. 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 Interval history is significant for a variety of problems which are probably related to her lymphoma. She is having significant right lower extremity swelling. This was complicated by erythema over the leg. Dr. Brigitte [name] has taken her off amlodipine and started her on hydrochlorothiazide, which has helped some. The rash is also improved. Of course she has also noted that her lymph nodes are more prominent. Those are described in the physical exam section. Finally she is lost about 20 pounds without really trying.  REVIEW OF SYSTEMS: Maria [name] Denies drenching sweats but she does have what may well be hot flashes. She is not unusually fatigued, and still works 8 hour days 5 days a week. She has had no fevers, intercurrent infections, unusual headaches, visual changes, nausea, vomiting, cough, phlegm production, or pleurisy. There has been no change in bowel or bladder habits. A detailed review of systems was otherwise noncontributory  PAST MEDICAL HISTORY: Past Medical History  Diagnosis Date  . Allergy   . Asthma   . Cancer     chronic lymphacytic leukemia  . Diabetes mellitus   . Hyperlipidemia   . Hypertension   . Thyroid disease     hypothyroidism  . Arthritis     PAST SURGICAL HISTORY: Past Surgical History  Procedure Laterality Date  . Abdominal hysterectomy    . Strabismus surgery    . Tonsillectomy     . Lymph node dissection      FAMILY HISTORY Family History  Problem Relation Age of Onset  . Colon cancer Neg Hx   . Pancreatic cancer Neg Hx   . Rectal cancer Neg Hx   . Stomach cancer Neg Hx   . Liver cancer Mother   . Heart disease Father   . Breast cancer Sister    The patient's father died at the age of 34 from a myocardial infarction. The patient's mother died at the age of 67 from primary liver carcinoma. The patient had no brothers. Her one sister, [name], has a history of breast cancer  GYNECOLOGIC HISTORY: Menarche at around age 63. The patient is GX P0. She underwent simple hysterectomy without salpingo-oophorectomy in [place]: RN at [employer], working in the [employer], 8h/d x 5d/week. She lives by herself with her caps [place] and [place]. She attends a local [employer]: Not in place  HEALTH MAINTENANCE: History  Substance Use Topics  . Smoking status: Never Smoker   . Smokeless tobacco: Never Used  . Alcohol Use: Yes     Comment: occasional alcohol intake; once or twice monthly     Colonoscopy:  PAP:  Bone density:  Lipid panel:  Allergies  Allergen Reactions  . Cephalexin     REACTION: Rash  . Lactose Intolerance (Gi)     Diarrhea, gas bloating  . Iohexol Swelling and Rash    Current Outpatient Prescriptions  Medication Sig Dispense Refill  . DIOVAN HCT 160-12.5 MG per tablet Take by mouth daily.     . Fluticasone-Salmeterol (ADVAIR DISKUS) 100-50 MCG/DOSE AEPB Inhale 1 puff into the lungs every 12 (twelve) hours as needed.     Marland Kitchen LORazepam (ATIVAN) 1 MG tablet Take 1 mg by mouth as needed.     . naproxen sodium (ANAPROX) 220 MG tablet Take 220 mg by mouth as needed.      Marland Kitchen SYNTHROID 88 MCG tablet Take 88 mcg by mouth daily.      No current facility-administered medications for this visit.    OBJECTIVE: Middle-aged white woman who appears stated age 63 Vitals:   07/20/14 0853  BP: 138/56  [name]: 76   Temp: 98.2 F (36.8 C)  Resp: 18     Body mass index is 31.59 kg/(m^2).    ECOG FS: 1  Sclerae unicteric, EOMs intact Oropharynx clear, teeth in good repair There is extensive lymphadenopathy in the cervical (bilateral) axillary (bilateral) and inguinal areas. The largest lymph nodeconglomerate in the left axilla measures approximately 4 cm by palpation. There are movable and rubbery. Lungs no rales or rhonchi Heart regular rate and rhythm Abd soft, nontender, positive bowel sounds, no splenomegaly palpated MSK no focal spinal tenderness, bilateral lower extremity edema, 3+ on the right, 2+ on the left. Neuro: nonfocal, well oriented, appropriate affect Breasts: Deferred   LAB RESULTS: Lab Results  Component Value Date   WBC 117.4* 07/20/2014   NEUTROABS 4.6 07/20/2014   HGB 12.1 07/20/2014   HCT 39.9 07/20/2014   MCV 96.5 07/20/2014   PLT 171 07/20/2014      Chemistry      Component Value Date/Time   NA 141 06/22/2014 0826   NA 138 07/24/2010 1356   K 4.5 06/22/2014 0826   K 4.1 07/24/2010 1356   CL 105 07/24/2010 1356   CO2 24 06/22/2014 0826   CO2 26 07/24/2010 1356   BUN 21.6 06/22/2014 0826   BUN 13 07/24/2010 1356   CREATININE 1.1 06/22/2014 0826   CREATININE 0.75 07/24/2010 1356   GLU 101* 10/18/2009 1542      Component Value Date/Time   CALCIUM 9.4 06/22/2014 0826   CALCIUM 9.2 07/24/2010 1356   ALKPHOS 207* 06/22/2014 0826   ALKPHOS 81 07/24/2010 1356   AST 28 06/22/2014 0826   AST 24 07/24/2010 1356   ALT 15 06/22/2014 0826   ALT 26 07/24/2010 1356   BILITOT 0.76 06/22/2014 0826   BILITOT 0.6 07/24/2010 1356       No results found for: LABCA2  No components found for: [id number]  No results for input(s): INR in the last 168 hours.  Urinalysis    Component Value Date/Time   COLORURINE LT. YELLOW 01/24/2010 1017   APPEARANCEUR CLEAR 01/24/2010 1017   LABSPEC <=1.005 01/24/2010 1017   PHURINE 5.5 01/24/2010 1017   GLUCOSEU NEGATIVE  01/24/2010 [place] 01/24/2010 1017   KETONESUR NEGATIVE 01/24/2010 1017   UROBILINOGEN 0.2 01/24/2010 1017   NITRITE NEGATIVE 01/24/2010 [place] 01/24/2010 1017    STUDIES: No results found.  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 There was no CD10 or cyclin D1 positivity identified  (4) to start ibrutinib at 420 mg/ day 08/09/2014  PLAN:  Alayne is having significant problems that I think are probably related to her lymphoma. The chief one of course is the right leg swelling. I am going to obtain a PET scan to see if that helps clarify the issue but she has plenty of adenopathy in the right inguinal area and that in itselfmay be all the evidence we need. She has also lost about 20 pounds without really trying to.  For these reasons, even though she does not have anemia or thrombocytopenia, I think we need to start some sort of treatment. Because her main issue is adenopathy, I think she may particularly benefit from ibrutinib. Today we discussed the possible toxicities, side effects and complications of this agent. I'm going to try to get it mailed to her home and I have asked her to started at 3 tablets daily beginning November 30. I'm going to see her a week after that with lab work and physical exam and then we will follow closely for the first few weeks to make sure she tolerates the medication without any untoward side effects.  If she tolerates it well we can follow the physical examand lab work  initially and eventually we will obtain a repeat PET scan. The physical exam however is very informative in her case.  Ravynn has a good understanding of the overall plan. She agrees with it. She knows the goal of treatment in her case is control. She will call with any problems that may develop before her next visit here.   Chosen Geske C    07/20/2014

## 2014-07-27 ENCOUNTER — Encounter: Payer: Self-pay | Admitting: *Deleted

## 2014-07-27 ENCOUNTER — Ambulatory Visit (HOSPITAL_COMMUNITY)
Admission: RE | Admit: 2014-07-27 | Discharge: 2014-07-27 | Disposition: A | Discharge status: Home / Self Care | Payer: BC Managed Care – PPO | Source: Ambulatory Visit | Attending: Oncology | Admitting: Oncology

## 2014-07-27 DIAGNOSIS — R591 Generalized enlarged lymph nodes: Secondary | ICD-10-CM | POA: Diagnosis not present

## 2014-07-27 DIAGNOSIS — C9110 Chronic lymphocytic leukemia of B-cell type not having achieved remission: Secondary | ICD-10-CM | POA: Insufficient documentation

## 2014-07-27 DIAGNOSIS — R161 Splenomegaly, not elsewhere classified: Secondary | ICD-10-CM | POA: Insufficient documentation

## 2014-07-27 LAB — GLUCOSE, CAPILLARY: Glucose-Capillary: 103 mg/dL — ABNORMAL HIGH (ref 70–99)

## 2014-07-27 MED ORDER — FLUDEOXYGLUCOSE F - 18 (FDG) INJECTION
9.8000 | Freq: Once | INTRAVENOUS | Status: AC | PRN
  Administered 2014-07-27: 9.8 via INTRAVENOUS

## 2014-07-27 NOTE — Progress Notes (Signed)
RECEIVED A FAX FROM [facility] CONCERNING A CONFIRMATION OF PRESCRIPTION SHIPMENT FOR Maria [name] ON 07/26/14.

## 2014-07-28 ENCOUNTER — Other Ambulatory Visit: Payer: Self-pay | Admitting: Oncology

## 2014-08-09 ENCOUNTER — Other Ambulatory Visit (HOSPITAL_BASED_OUTPATIENT_CLINIC_OR_DEPARTMENT_OTHER): Payer: BC Managed Care – PPO

## 2014-08-09 ENCOUNTER — Other Ambulatory Visit: Payer: Self-pay | Admitting: *Deleted

## 2014-08-09 DIAGNOSIS — C9110 Chronic lymphocytic leukemia of B-cell type not having achieved remission: Secondary | ICD-10-CM

## 2014-08-09 LAB — CBC WITH DIFFERENTIAL/PLATELET
BASO%: 1.1 % (ref 0.0–2.0)
Basophils Absolute: 2 10*3/uL — ABNORMAL HIGH (ref 0.0–0.1)
EOS ABS: 0.3 10*3/uL (ref 0.0–0.5)
EOS%: 0.2 % (ref 0.0–7.0)
HCT: 38.5 % (ref 34.8–46.6)
HEMOGLOBIN: 11.5 g/dL — AB (ref 11.6–15.9)
LYMPH%: 95.4 % — ABNORMAL HIGH (ref 14.0–49.7)
MCH: 29.9 pg (ref 25.1–34.0)
MCHC: 29.9 g/dL — ABNORMAL LOW (ref 31.5–36.0)
MCV: 100 fL (ref 79.5–101.0)
MONO#: 3.5 10*3/uL — ABNORMAL HIGH (ref 0.1–0.9)
MONO%: 1.9 % (ref 0.0–14.0)
NEUT%: 1.4 % — ABNORMAL LOW (ref 38.4–76.8)
NEUTROS ABS: 2.6 10*3/uL (ref 1.5–6.5)
NRBC: 0 % (ref 0–0)
Platelets: 143 10*3/uL — ABNORMAL LOW (ref 145–400)
RBC: 3.85 10*6/uL (ref 3.70–5.45)
RDW: 15.4 % — AB (ref 11.2–14.5)
WBC: 180.3 10*3/uL — AB (ref 3.9–10.3)
lymph#: 172 10*3/uL — ABNORMAL HIGH (ref 0.9–3.3)

## 2014-08-09 LAB — COMPREHENSIVE METABOLIC PANEL (CC13)
ALT: 17 U/L (ref 0–55)
ANION GAP: 8 meq/L (ref 3–11)
AST: 28 U/L (ref 5–34)
Albumin: 3.6 g/dL (ref 3.5–5.0)
Alkaline Phosphatase: 160 U/L — ABNORMAL HIGH (ref 40–150)
BILIRUBIN TOTAL: 0.6 mg/dL (ref 0.20–1.20)
BUN: 18.9 mg/dL (ref 7.0–26.0)
CHLORIDE: 104 meq/L (ref 98–109)
CO2: 28 mEq/L (ref 22–29)
Calcium: 9.6 mg/dL (ref 8.4–10.4)
Creatinine: 0.9 mg/dL (ref 0.6–1.1)
Glucose: 101 mg/dl (ref 70–140)
Potassium: 4.6 mEq/L (ref 3.5–5.1)
SODIUM: 140 meq/L (ref 136–145)
Total Protein: 6 g/dL — ABNORMAL LOW (ref 6.4–8.3)

## 2014-08-09 LAB — TECHNOLOGIST REVIEW

## 2014-08-17 ENCOUNTER — Other Ambulatory Visit: Payer: Self-pay | Admitting: *Deleted

## 2014-08-17 DIAGNOSIS — C9110 Chronic lymphocytic leukemia of B-cell type not having achieved remission: Secondary | ICD-10-CM

## 2014-08-18 ENCOUNTER — Telehealth: Payer: Self-pay | Admitting: Oncology

## 2014-08-18 ENCOUNTER — Other Ambulatory Visit (HOSPITAL_BASED_OUTPATIENT_CLINIC_OR_DEPARTMENT_OTHER): Payer: BC Managed Care – PPO

## 2014-08-18 ENCOUNTER — Ambulatory Visit (HOSPITAL_BASED_OUTPATIENT_CLINIC_OR_DEPARTMENT_OTHER): Payer: BC Managed Care – PPO | Admitting: Oncology

## 2014-08-18 VITALS — BP 152/67 | HR 67 | Temp 97.6°F | Resp 18 | Ht 66.0 in | Wt 189.4 lb

## 2014-08-18 DIAGNOSIS — C9110 Chronic lymphocytic leukemia of B-cell type not having achieved remission: Secondary | ICD-10-CM

## 2014-08-18 LAB — COMPREHENSIVE METABOLIC PANEL (CC13)
ALT: 17 U/L (ref 0–55)
AST: 26 U/L (ref 5–34)
Albumin: 3.5 g/dL (ref 3.5–5.0)
Alkaline Phosphatase: 163 U/L — ABNORMAL HIGH (ref 40–150)
Anion Gap: 8 mEq/L (ref 3–11)
BUN: 13.9 mg/dL (ref 7.0–26.0)
CO2: 27 mEq/L (ref 22–29)
Calcium: 9.2 mg/dL (ref 8.4–10.4)
Chloride: 104 mEq/L (ref 98–109)
Creatinine: 0.8 mg/dL (ref 0.6–1.1)
EGFR: 82 mL/min/{1.73_m2} — ABNORMAL LOW (ref >90)
Glucose: 80 mg/dl (ref 70–140)
Potassium: 4.2 mEq/L (ref 3.5–5.1)
Sodium: 139 mEq/L (ref 136–145)
Total Bilirubin: 0.57 mg/dL (ref 0.20–1.20)
Total Protein: 5.9 g/dL — ABNORMAL LOW (ref 6.4–8.3)

## 2014-08-18 LAB — CBC WITH DIFFERENTIAL/PLATELET
BASO%: 0.7 % (ref 0.0–2.0)
Basophils Absolute: 1.3 10*3/uL — ABNORMAL HIGH (ref 0.0–0.1)
EOS%: 0.2 % (ref 0.0–7.0)
Eosinophils Absolute: 0.3 10*3/uL (ref 0.0–0.5)
HEMATOCRIT: 36.6 % (ref 34.8–46.6)
HEMOGLOBIN: 10.8 g/dL — AB (ref 11.6–15.9)
LYMPH#: 167.7 10*3/uL — AB (ref 0.9–3.3)
LYMPH%: 95 % — ABNORMAL HIGH (ref 14.0–49.7)
MCH: 29.4 pg (ref 25.1–34.0)
MCHC: 29.5 g/dL — AB (ref 31.5–36.0)
MCV: 99.7 fL (ref 79.5–101.0)
MONO#: 4.1 10*3/uL — ABNORMAL HIGH (ref 0.1–0.9)
MONO%: 2.3 % (ref 0.0–14.0)
NEUT#: 3.1 10*3/uL (ref 1.5–6.5)
NEUT%: 1.8 % — AB (ref 38.4–76.8)
Platelets: 155 10*3/uL (ref 145–400)
RBC: 3.67 10*6/uL — ABNORMAL LOW (ref 3.70–5.45)
RDW: 15.1 % — ABNORMAL HIGH (ref 11.2–14.5)
WBC: 176.6 10*3/uL (ref 3.9–10.3)

## 2014-08-18 LAB — TECHNOLOGIST REVIEW

## 2014-08-18 NOTE — Telephone Encounter (Signed)
per pof to sch pt appt-gave pt copy of sch °

## 2014-08-18 NOTE — Progress Notes (Signed)
ID: Maria [name]   DOB: 1950-11-23  MR#: [id number]  FBP#:[id number]  PCP: Marton Redwood, MD GYN:  SU:  OTHER MD:  CHIEF COMPLAINT: Small lymphocytic lymphoma  CURRENT TREATMENT: Ibrutinib   HISTORY OF PRESENT ILLNESS: From the original intake note:  Maria [name] developed what she thought was "the flu" in December of 2002. She noted a large lymph node developing in her right anterior cervical area. 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   Her subsequent history is as detailed below   INTERVAL HISTORY: Maria [name] returns today for followup of her chronic lymphoid leukemia/ small lymphocytic lymphoma.. She started the ibrutinib approximately 10 days ago. Initially she had a little bit of fatigue, but that rapidly cleared and she is back to work full-time. What she next noticed is a significant decrease in her lymphadenopathy, so that people at work remarked that her neck looked different. The next thing she noticed was diuresis. She went down about 10 pounds, mostly fluid. The swelling she had around her right knee for example is now nearly normalized. The swelling around her ankles is still present but decreased. Overall she is having a very good early response. She has not had problems with diarrhea, or bleeding, though she has had some easy bruisability. Has been no fever or drenching sweats,  REVIEW OF SYSTEMS: Aside from these issues a detailed review of systems today was noncontributory  PAST MEDICAL HISTORY: Past Medical History  Diagnosis Date  . Allergy   . Asthma   . Cancer     chronic lymphacytic leukemia  . Diabetes mellitus   . Hyperlipidemia   . Hypertension   . Thyroid disease     hypothyroidism  . Arthritis     PAST SURGICAL HISTORY: Past Surgical History  Procedure Laterality Date  . Abdominal hysterectomy    . Strabismus surgery    . Tonsillectomy    . Lymph node dissection       FAMILY HISTORY Family History  Problem Relation Age of Onset  . Colon cancer Neg Hx   . Pancreatic cancer Neg Hx   . Rectal cancer Neg Hx   . Stomach cancer Neg Hx   . Liver cancer Mother   . Heart disease Father   . Breast cancer Sister    The patient's father died at the age of 86 from a myocardial infarction. The patient's mother died at the age of 87 from primary liver carcinoma. The patient had no brothers. Her one sister, [name], has a history of breast cancer  GYNECOLOGIC HISTORY: Menarche at around age 89. The patient is GX P0. She underwent simple hysterectomy without salpingo-oophorectomy in [place]: RN at [employer], working in the [employer], 8h/d x 5d/week. She lives by herself with her caps [place] and [place]. She attends a local [place]: Not in place  HEALTH MAINTENANCE: History  Substance Use Topics  . Smoking status: Never Smoker   . Smokeless tobacco: Never Used  . Alcohol Use: Yes     Comment: occasional alcohol intake; once or twice monthly     Colonoscopy:  PAP:  Bone density:  Lipid panel:  Allergies  Allergen Reactions  . Cephalexin     REACTION: Rash  . Lactose Intolerance (Gi)     Diarrhea, gas bloating  . Iohexol Swelling and Rash    Current Outpatient Prescriptions  Medication Sig Dispense Refill  .  allopurinol (ZYLOPRIM) 300 MG tablet Take 1 tablet (300 mg total) by mouth daily. 90 tablet 4  . Fluticasone-Salmeterol (ADVAIR DISKUS) 100-50 MCG/DOSE AEPB Inhale 1 puff into the lungs every 12 (twelve) hours as needed.     . ibrutinib (IMBRUVICA) 140 MG capsul Take 3 capsules (420 mg total) by mouth daily. 90 capsule 6  . LORazepam (ATIVAN) 1 MG tablet Take 1 mg by mouth as needed.     . naproxen sodium (ANAPROX) 220 MG tablet Take 220 mg by mouth as needed.      Marland Kitchen SYNTHROID 88 MCG tablet Take 88 mcg by mouth daily.      No current facility-administered medications for this visit.    OBJECTIVE:  Middle-aged white woman in no acute distress Filed Vitals:   08/18/14 1048  BP: 152/67  [name]: 67  Temp: 97.6 F (36.4 C)  Resp: 18     Body mass index is 30.58 kg/(m^2).    ECOG FS: 1  Sclerae unicteric, pupils round and equal Oropharynx clear, slightly dry There is still palpable cervical adenopathy bilaterally, but it is no longer deforming. I can palpate lymph nodes in both axillae, right greater than left, but these are much softer and somewhat smaller than before.. Lungs no rales or rhonchi Heart regular rate and rhythm Abd soft, nontender, positive bowel sounds, splenomegaly not palpable MSK no focal spinal tenderness, bilateral lower extremity edema, currently 2+ and symmetrical Neuro: nonfocal, well oriented, positive affect Breasts: Deferred   LAB RESULTS: Lab Results  Component Value Date   WBC 176.6* 08/18/2014   NEUTROABS 3.1 08/18/2014   HGB 10.8* 08/18/2014   HCT 36.6 08/18/2014   MCV 99.7 08/18/2014   PLT 155 08/18/2014      Chemistry      Component Value Date/Time   NA 140 08/09/2014 0935   NA 138 07/24/2010 1356   K 4.6 08/09/2014 0935   K 4.1 07/24/2010 1356   CL 105 07/24/2010 1356   CO2 28 08/09/2014 0935   CO2 26 07/24/2010 1356   BUN 18.9 08/09/2014 0935   BUN 13 07/24/2010 1356   CREATININE 0.9 08/09/2014 0935   CREATININE 0.75 07/24/2010 1356   GLU 101* 10/18/2009 1542      Component Value Date/Time   CALCIUM 9.6 08/09/2014 0935   CALCIUM 9.2 07/24/2010 1356   ALKPHOS 160* 08/09/2014 0935   ALKPHOS 81 07/24/2010 1356   AST 28 08/09/2014 0935   AST 24 07/24/2010 1356   ALT 17 08/09/2014 0935   ALT 26 07/24/2010 1356   BILITOT 0.60 08/09/2014 0935   BILITOT 0.6 07/24/2010 1356       No results found for: LABCA2  No components found for: [id number]  No results for input(s): INR in the last 168 hours.  Urinalysis    Component Value Date/Time   COLORURINE LT. YELLOW 01/24/2010 1017   APPEARANCEUR CLEAR 01/24/2010 1017    LABSPEC <=1.005 01/24/2010 1017   PHURINE 5.5 01/24/2010 1017   GLUCOSEU NEGATIVE 01/24/2010 [place] 01/24/2010 1017   KETONESUR NEGATIVE 01/24/2010 1017   UROBILINOGEN 0.2 01/24/2010 1017   NITRITE NEGATIVE 01/24/2010 1017   LEUKOCYTESUR NEGATIVE 01/24/2010 1017    STUDIES: Nm Pet Image Restag (ps) Skull Base To Thigh  07/27/2014   CLINICAL DATA:  Subsequent treatment strategy for chronic lymphoid leukemia.  EXAM: NUCLEAR MEDICINE PET SKULL BASE TO THIGH  TECHNIQUE: 9.8 mCi F-18 FDG was injected intravenously. Full-ring PET imaging was performed from the skull base  to thigh after the radiotracer. CT data was obtained and used for attenuation correction and anatomic localization.  FASTING BLOOD GLUCOSE:  Value: 103 mg/dl  COMPARISON:  None available  FINDINGS: NECK  Bulky bilateral mildly hypermetabolic lymph nodes along the left and right cervical chains. For example conglomeration of lymph nodes in the left neck measuring 4.8 x 5.2 cm with SUV max 2.7. Individual lymph nodes measure up to 2.2 cm short axis.  CHEST  There is bulky bilateral axillary adenopathy. Individual lymph nodes measure up to 3.5 cm in the left axilla with SUV max equal 3.2. Mild mediastinal lymphadenopathy. Neuro of focal pulmonary nodularity. There is a mosaic perfusion pattern the lungs suggesting air trapping.  ABDOMEN/PELVIS  There is bulky low metabolically active and periaortic lymph nodes along the IVC. Lymph node conglomeration measures 8.3 x 6.0 cm with SUV max 5.0. There is more is a hypermetabolic lymph nodes within the upper abdominal mesentery with measuring 4.8 cm with SUV max of 9.9. There is intensely hypermetabolic gastrohepatic ligament lymph nodes with SUV max 6.9  Extremely enlarged external iliac lymph nodes measure 5.5 cm in short axis and 12 cm length. These have moderate metabolic activity with SUV max 5.2. Smaller inguinal lymph nodes are noted.  The spleen is enlarged without  significant metabolic. Spleen measures 18 cm in craniocaudad dimension.  SKELETON  No aggressive osseous lesion.  IMPRESSION: 1. Bulky extensive lymphadenopathy involving cervical lymph nodes, axillary lymph nodes and mediastinal lymph nodes in the thorax. Metabolic activities of these thoracic lymph nodes is mild consistent low-grade lymphoma. 2. Bulky retroperitoneal lymph nodes compressed the IVC. 3. More intensely metabolic mesenteric lymph nodes in the upper central mesentery abdominal mesentery and gastrohepatic ligament nodes consistent with higher-grade lymphoma. 4. Extremely bulky inguinal iliac lymph nodes with moderate metabolic activity. 5. Splenomegaly with no significant hypermetabolic activity.   Electronically Signed   By: Suzy Bouchard M.D.   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 There was no CD10 or cyclin D1 positivity identified  (4) started ibrutinib at 420 mg/ day 08/09/2014  PLAN:  Leahmarie is tolerating the ibrutinib well and already is having a significant response. I am delighted particularly at the visible change in her neck. All her lymph nodes however are softer and most of them are also smaller.  We reviewed the fact that the peripheral lymphocytosis is expected. It does not mean that the disease is worse. What it means is that where clearing the lymph nodes. The lymphocytes are moving out into the blood. The lymphocytosis will slowly decrease over the next few months.  Her weight is down 10 pounds chiefly  because of the diuresis she has experienced. This is a good weight for her. I have encouraged her to exercise if possible. She is considering traveling to [place] to visit family, but there are many children there and she is afraid I think correctly of getting an infection. Her immune system certainly is not going to be normal for quite a while yet  I gave her a copy of the PET scan she just had an  we went over it. What we would not want to see is everything deeming and shrinking and 1 lymph node getting bigger and brighter. We are going to be checking her labwork every 6 weeks and doing visits every 3 months. If there are no unusual findings I would repeat a PET scan in 6 months.  Bali is a good understanding of the overall plan. She agrees with it. She knows the goal of treatment in her case is control. She will call with any problems that may develop before next visit here.  [name],GUSTAV C    08/18/2014

## 2014-09-23 ENCOUNTER — Encounter: Payer: Self-pay | Admitting: Oncology

## 2014-09-23 NOTE — Progress Notes (Signed)
Faxed fmla form to [place] @ [phone]

## 2014-09-27 ENCOUNTER — Telehealth: Payer: Self-pay | Admitting: Oncology

## 2014-09-27 NOTE — Telephone Encounter (Signed)
pt called to r/s 1/20 appt to 1/19. pt given new appt d/t for 1/19 @ 9:30 am - d/t per pt.

## 2014-09-28 ENCOUNTER — Other Ambulatory Visit (HOSPITAL_BASED_OUTPATIENT_CLINIC_OR_DEPARTMENT_OTHER): Payer: BC Managed Care – PPO

## 2014-09-28 DIAGNOSIS — C9110 Chronic lymphocytic leukemia of B-cell type not having achieved remission: Secondary | ICD-10-CM

## 2014-09-28 LAB — COMPREHENSIVE METABOLIC PANEL (CC13)
ALBUMIN: 3.6 g/dL (ref 3.5–5.0)
ALT: 43 U/L (ref 0–55)
ANION GAP: 8 meq/L (ref 3–11)
AST: 47 U/L — ABNORMAL HIGH (ref 5–34)
Alkaline Phosphatase: 287 U/L — ABNORMAL HIGH (ref 40–150)
BUN: 20.1 mg/dL (ref 7.0–26.0)
CO2: 27 meq/L (ref 22–29)
Calcium: 9 mg/dL (ref 8.4–10.4)
Chloride: 103 mEq/L (ref 98–109)
Creatinine: 0.8 mg/dL (ref 0.6–1.1)
EGFR: 82 mL/min/{1.73_m2} — ABNORMAL LOW (ref >90)
Glucose: 102 mg/dl (ref 70–140)
Potassium: 4.5 mEq/L (ref 3.5–5.1)
Sodium: 137 mEq/L (ref 136–145)
Total Bilirubin: 0.47 mg/dL (ref 0.20–1.20)
Total Protein: 6.4 g/dL (ref 6.4–8.3)

## 2014-09-28 LAB — CBC WITH DIFFERENTIAL/PLATELET
BASO%: 0.1 % (ref 0.0–2.0)
BASOS ABS: 0.1 10*3/uL (ref 0.0–0.1)
EOS%: 0.8 % (ref 0.0–7.0)
Eosinophils Absolute: 0.9 10*3/uL — ABNORMAL HIGH (ref 0.0–0.5)
HCT: 39.8 % (ref 34.8–46.6)
HEMOGLOBIN: 11.7 g/dL (ref 11.6–15.9)
LYMPH%: 88.5 % — ABNORMAL HIGH (ref 14.0–49.7)
MCH: 27.9 pg (ref 25.1–34.0)
MCHC: 29.3 g/dL — ABNORMAL LOW (ref 31.5–36.0)
MCV: 95.1 fL (ref 79.5–101.0)
MONO#: 2 10*3/uL — AB (ref 0.1–0.9)
MONO%: 1.8 % (ref 0.0–14.0)
NEUT%: 8.8 % — AB (ref 38.4–76.8)
NEUTROS ABS: 9.6 10*3/uL — AB (ref 1.5–6.5)
PLATELETS: 190 10*3/uL (ref 145–400)
RBC: 4.19 10*6/uL (ref 3.70–5.45)
RDW: 15.3 % — ABNORMAL HIGH (ref 11.2–14.5)
WBC: 108.8 10*3/uL (ref 3.9–10.3)
lymph#: 96.2 10*3/uL — ABNORMAL HIGH (ref 0.9–3.3)

## 2014-09-28 LAB — LACTATE DEHYDROGENASE (CC13): LDH: 154 U/L (ref 125–245)

## 2014-09-28 LAB — TECHNOLOGIST REVIEW

## 2014-09-29 ENCOUNTER — Other Ambulatory Visit: Payer: BC Managed Care – PPO

## 2014-11-10 ENCOUNTER — Other Ambulatory Visit (HOSPITAL_BASED_OUTPATIENT_CLINIC_OR_DEPARTMENT_OTHER): Payer: BC Managed Care – PPO

## 2014-11-10 ENCOUNTER — Telehealth: Payer: Self-pay | Admitting: Oncology

## 2014-11-10 ENCOUNTER — Ambulatory Visit (HOSPITAL_BASED_OUTPATIENT_CLINIC_OR_DEPARTMENT_OTHER): Payer: BC Managed Care – PPO | Admitting: Oncology

## 2014-11-10 VITALS — BP 123/57 | HR 66 | Temp 98.3°F | Resp 18 | Ht 66.0 in | Wt 183.1 lb

## 2014-11-10 DIAGNOSIS — C9110 Chronic lymphocytic leukemia of B-cell type not having achieved remission: Secondary | ICD-10-CM

## 2014-11-10 DIAGNOSIS — J4530 Mild persistent asthma, uncomplicated: Secondary | ICD-10-CM

## 2014-11-10 DIAGNOSIS — C8359 Lymphoblastic (diffuse) lymphoma, extranodal and solid organ sites: Secondary | ICD-10-CM

## 2014-11-10 DIAGNOSIS — I10 Essential (primary) hypertension: Secondary | ICD-10-CM

## 2014-11-10 LAB — COMPREHENSIVE METABOLIC PANEL (CC13)
ALK PHOS: 271 U/L — AB (ref 40–150)
ALT: 44 U/L (ref 0–55)
AST: 49 U/L — ABNORMAL HIGH (ref 5–34)
Albumin: 3.6 g/dL (ref 3.5–5.0)
Anion Gap: 10 mEq/L (ref 3–11)
BILIRUBIN TOTAL: 0.52 mg/dL (ref 0.20–1.20)
BUN: 15.9 mg/dL (ref 7.0–26.0)
CO2: 27 mEq/L (ref 22–29)
Calcium: 9.5 mg/dL (ref 8.4–10.4)
Chloride: 101 mEq/L (ref 98–109)
Creatinine: 0.8 mg/dL (ref 0.6–1.1)
EGFR: 82 mL/min/{1.73_m2} — AB (ref >90)
GLUCOSE: 107 mg/dL (ref 70–140)
Potassium: 4.1 mEq/L (ref 3.5–5.1)
Sodium: 137 mEq/L (ref 136–145)
Total Protein: 6.3 g/dL — ABNORMAL LOW (ref 6.4–8.3)

## 2014-11-10 LAB — TECHNOLOGIST REVIEW

## 2014-11-10 LAB — CBC WITH DIFFERENTIAL/PLATELET
BASO%: 0.3 % (ref 0.0–2.0)
BASOS ABS: 0.2 10*3/uL — AB (ref 0.0–0.1)
EOS ABS: 0.5 10*3/uL (ref 0.0–0.5)
EOS%: 0.7 % (ref 0.0–7.0)
HEMATOCRIT: 38.2 % (ref 34.8–46.6)
HEMOGLOBIN: 12.2 g/dL (ref 11.6–15.9)
LYMPH%: 89.6 % — AB (ref 14.0–49.7)
MCH: 28.8 pg (ref 25.1–34.0)
MCHC: 31.9 g/dL (ref 31.5–36.0)
MCV: 90.3 fL (ref 79.5–101.0)
MONO#: 1.3 10*3/uL — AB (ref 0.1–0.9)
MONO%: 2 % (ref 0.0–14.0)
NEUT#: 4.8 10*3/uL (ref 1.5–6.5)
NEUT%: 7.4 % — ABNORMAL LOW (ref 38.4–76.8)
PLATELETS: 159 10*3/uL (ref 145–400)
RBC: 4.23 10*6/uL (ref 3.70–5.45)
RDW: 14.7 % — ABNORMAL HIGH (ref 11.2–14.5)
WBC: 65.8 10*3/uL (ref 3.9–10.3)
lymph#: 59 10*3/uL — ABNORMAL HIGH (ref 0.9–3.3)

## 2014-11-10 LAB — LACTATE DEHYDROGENASE (CC13): LDH: 180 U/L (ref 125–245)

## 2014-11-10 NOTE — Progress Notes (Signed)
ID: [name]   DOB: 1950-09-21  MR#: [id number]  MEQ#:[id number]  PCP: Marton Redwood, MD GYN:  SU:  OTHER MD:  CHIEF COMPLAINT: Small lymphocytic lymphoma  CURRENT TREATMENT: Ibrutinib   HISTORY OF PRESENT ILLNESS: From the original intake note:  Maria [name] developed what she thought was "the flu" in December of 2002. She noted a large lymph node developing in her right anterior cervical area. 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   Her subsequent history is as detailed below   INTERVAL HISTORY: Maria [name] returns today for followup of her chronic lymphoid leukemia/ small lymphocytic lymphoma.. She continues on ibrutinib, which she is tolerating with no side effects that she is aware of. She takes it at bedtime and she feels it may make her a little bit drowsy. There is no drowsiness in the morning. Incidentally she is now working 3 PM 2:11 PM shifts. After week or 2 she has gotten to like it because she has whole morning and early afternoon to get chores done.   REVIEW OF SYSTEMS: Maria [name] reports no fever, rash, bleeding, or unexplained fatigue. She has lost a little bit more weight, but her goal is to lose another 20 pounds. A lot of the weight she initially lost of course was fluid weight. She is delighted that her neck lymph nodes are no longer visible. A detailed review of systems today was otherwise stable  PAST MEDICAL HISTORY: Past Medical History  Diagnosis Date  . Allergy   . Asthma   . Cancer     chronic lymphacytic leukemia  . Diabetes mellitus   . Hyperlipidemia   . Hypertension   . Thyroid disease     hypothyroidism  . Arthritis     PAST SURGICAL HISTORY: Past Surgical History  Procedure Laterality Date  . Abdominal hysterectomy    . Strabismus surgery    . Tonsillectomy    . Lymph node dissection      FAMILY HISTORY Family History  Problem Relation Age of Onset  . Colon  cancer Neg Hx   . Pancreatic cancer Neg Hx   . Rectal cancer Neg Hx   . Stomach cancer Neg Hx   . Liver cancer Mother   . Heart disease Father   . Breast cancer Sister    The patient's father died at the age of 2 from a myocardial infarction. The patient's mother died at the age of 71 from primary liver carcinoma. The patient had no brothers. Her one sister, Maria [name], has a history of breast cancer  GYNECOLOGIC HISTORY: Menarche at around age 41. The patient is GX P0. 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 She lives by herself with her caps [place] and [place]. She attends a local [place]: Not in place  HEALTH MAINTENANCE: History  Substance Use Topics  . Smoking status: Never Smoker   . Smokeless tobacco: Never Used  . Alcohol Use: Yes     Comment: occasional alcohol intake; once or twice monthly     Colonoscopy:  PAP:  Bone density:  Lipid panel:  Allergies  Allergen Reactions  . Cephalexin     REACTION: Rash  . Lactose Intolerance (Gi)     Diarrhea, gas bloating  . Iohexol Swelling and Rash    Current Outpatient Prescriptions  Medication Sig Dispense Refill  .  allopurinol (ZYLOPRIM) 300 MG tablet Take 1 tablet (300 mg total) by mouth daily. 90 tablet 4  . Fluticasone-Salmeterol (ADVAIR DISKUS) 100-50 MCG/DOSE AEPB Inhale 1 puff into the lungs every 12 (twelve) hours as needed.     . ibrutinib (IMBRUVICA) 140 MG capsul Take 3 capsules (420 mg total) by mouth daily. 90 capsule 6  . LORazepam (ATIVAN) 1 MG tablet Take 1 mg by mouth as needed.     . naproxen sodium (ANAPROX) 220 MG tablet Take 220 mg by mouth as needed.      Marland Kitchen SYNTHROID 88 MCG tablet Take 88 mcg by mouth daily.      No current facility-administered medications for this visit.    OBJECTIVE: Middle-aged white woman who appears well Filed Vitals:   11/10/14 1200  BP:  123/57  [name]: 66  Temp: 98.3 F (36.8 C)  Resp: 18     Body mass index is 29.57 kg/(m^2).    ECOG FS: 0  Sclerae unicteric, EOMs intact Oropharynx clear, dentition in good repair There is a right anterior cervical triangle lymph node measuring about 1 cm, slightly smaller than before. Perhaps a little less firm than before. Bilateral axillary adenopathy is smaller and considerably softer. Lungs no rales or rhonchi Heart regular rate and rhythm Abd soft, nontender, positive bowel sounds, spleen not palpable MSK no focal spinal tenderness, bilateral ankle edema, 1+ Neuro: nonfocal, well oriented, positive affect Breasts: Deferred   LAB RESULTS: Lab Results  Component Value Date   WBC 65.8* 11/10/2014   NEUTROABS 4.8 11/10/2014   HGB 12.2 11/10/2014   HCT 38.2 11/10/2014   MCV 90.3 11/10/2014   PLT 159 11/10/2014      Chemistry      Component Value Date/Time   NA 137 11/10/2014 1147   NA 138 07/24/2010 1356   K 4.1 11/10/2014 1147   K 4.1 07/24/2010 1356   CL 105 07/24/2010 1356   CO2 27 11/10/2014 1147   CO2 26 07/24/2010 1356   BUN 15.9 11/10/2014 1147   BUN 13 07/24/2010 1356   CREATININE 0.8 11/10/2014 1147   CREATININE 0.75 07/24/2010 1356   GLU 101* 10/18/2009 1542      Component Value Date/Time   CALCIUM 9.5 11/10/2014 1147   CALCIUM 9.2 07/24/2010 1356   ALKPHOS 271* 11/10/2014 1147   ALKPHOS 81 07/24/2010 1356   AST 49* 11/10/2014 1147   AST 24 07/24/2010 1356   ALT 44 11/10/2014 1147   ALT 26 07/24/2010 1356   BILITOT 0.52 11/10/2014 1147   BILITOT 0.6 07/24/2010 1356       No results found for: LABCA2  No components found for: [id number]  No results for input(s): INR in the last 168 hours.  Urinalysis    Component Value Date/Time   COLORURINE LT. YELLOW 01/24/2010 1017   APPEARANCEUR CLEAR 01/24/2010 1017   LABSPEC <=1.005 01/24/2010 1017   PHURINE 5.5 01/24/2010 1017   GLUCOSEU NEGATIVE 01/24/2010 [place] 01/24/2010  1017   KETONESUR NEGATIVE 01/24/2010 1017   UROBILINOGEN 0.2 01/24/2010 1017   NITRITE NEGATIVE 01/24/2010 [place] 01/24/2010 1017    STUDIES: No results found.  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 There was no CD10 or cyclin D1 positivity identified  (4) started ibrutinib at 420 mg/ day 08/09/2014  PLAN:  Maria [name] is having an excellent response to the ibrutinib. She has essentially no side effects from it except that it makes her slightly sleepy, which is why she takes it at bedtime. She is obtaining it at $10 a month Maria [name] cost. She is distressed at the very high cost of the drug, but that really is something neither she nor I can control.  This is a long-term drug and she understands she will be on it so long as it is working. At some point if there is a prolonged plateau we could consider going off it for some time.  She is doing so well clinically I think instead of repeating a PET scan 3 months from now we will repeat 16 months from now. She will still continue to have labwork every month and she will see me again in 3 months from now mostly for physical exam. Overall I am delighted that she is doing so well.  She had been on hydrochlorothiazide because of her fluid retention. A lot of that fluid is gone now that she managed to shrink her lymph nodes enough that lymphatic obstruction is much less of an issue. I'm not sure she needs the medication. She is going to go off it for a couple weeks, and check her blood pressure  3 or 4 times in that. While she is relaxed. If those numbers are good, she may be able to get off that medication.  She has a good understanding of the overall plan. She agrees with it. She knows the goal of treatment in her case is control. She will call with any problems that may develop before next visit here.  Maria [name] C    11/10/2014

## 2014-11-10 NOTE — Telephone Encounter (Signed)
appts made and avs printed for pt  Maria [name] °

## 2014-12-22 ENCOUNTER — Other Ambulatory Visit (HOSPITAL_BASED_OUTPATIENT_CLINIC_OR_DEPARTMENT_OTHER): Payer: BC Managed Care – PPO

## 2014-12-22 DIAGNOSIS — C9110 Chronic lymphocytic leukemia of B-cell type not having achieved remission: Secondary | ICD-10-CM

## 2014-12-22 LAB — COMPREHENSIVE METABOLIC PANEL (CC13)
ALK PHOS: 257 U/L — AB (ref 40–150)
ALT: 45 U/L (ref 0–55)
ANION GAP: 10 meq/L (ref 3–11)
AST: 57 U/L — AB (ref 5–34)
Albumin: 3.5 g/dL (ref 3.5–5.0)
BILIRUBIN TOTAL: 0.65 mg/dL (ref 0.20–1.20)
BUN: 14.1 mg/dL (ref 7.0–26.0)
CO2: 27 mEq/L (ref 22–29)
Calcium: 8.8 mg/dL (ref 8.4–10.4)
Chloride: 104 mEq/L (ref 98–109)
Creatinine: 0.7 mg/dL (ref 0.6–1.1)
EGFR: 87 mL/min/{1.73_m2} — ABNORMAL LOW (ref >90)
GLUCOSE: 116 mg/dL (ref 70–140)
Potassium: 4.3 mEq/L (ref 3.5–5.1)
SODIUM: 141 meq/L (ref 136–145)
TOTAL PROTEIN: 6.1 g/dL — AB (ref 6.4–8.3)

## 2014-12-22 LAB — CBC WITH DIFFERENTIAL/PLATELET
BASO%: 0.3 % (ref 0.0–2.0)
Basophils Absolute: 0.1 10*3/uL (ref 0.0–0.1)
EOS%: 1.3 % (ref 0.0–7.0)
Eosinophils Absolute: 0.4 10*3/uL (ref 0.0–0.5)
HCT: 39.4 % (ref 34.8–46.6)
HGB: 12.2 g/dL (ref 11.6–15.9)
LYMPH%: 84.1 % — ABNORMAL HIGH (ref 14.0–49.7)
MCH: 27.1 pg (ref 25.1–34.0)
MCHC: 30.9 g/dL — ABNORMAL LOW (ref 31.5–36.0)
MCV: 87.6 fL (ref 79.5–101.0)
MONO#: 0.8 10*3/uL (ref 0.1–0.9)
MONO%: 2.6 % (ref 0.0–14.0)
NEUT#: 3.7 10*3/uL (ref 1.5–6.5)
NEUT%: 11.7 % — ABNORMAL LOW (ref 38.4–76.8)
Platelets: 145 10*3/uL (ref 145–400)
RBC: 4.5 10*6/uL (ref 3.70–5.45)
RDW: 16.3 % — ABNORMAL HIGH (ref 11.2–14.5)
WBC: 32 10*3/uL — ABNORMAL HIGH (ref 3.9–10.3)
lymph#: 26.9 10*3/uL — ABNORMAL HIGH (ref 0.9–3.3)

## 2014-12-22 LAB — LACTATE DEHYDROGENASE (CC13): LDH: 158 U/L (ref 125–245)

## 2014-12-22 LAB — TECHNOLOGIST REVIEW

## 2015-01-21 ENCOUNTER — Telehealth: Payer: Self-pay

## 2015-01-21 NOTE — Telephone Encounter (Signed)
Confirmation of shipment, delivery date 01/20/15

## 2015-02-02 ENCOUNTER — Other Ambulatory Visit: Payer: BC Managed Care – PPO

## 2015-02-10 ENCOUNTER — Telehealth: Payer: Self-pay | Admitting: Oncology

## 2015-02-10 ENCOUNTER — Ambulatory Visit (HOSPITAL_BASED_OUTPATIENT_CLINIC_OR_DEPARTMENT_OTHER): Payer: BC Managed Care – PPO | Admitting: Oncology

## 2015-02-10 ENCOUNTER — Other Ambulatory Visit (HOSPITAL_BASED_OUTPATIENT_CLINIC_OR_DEPARTMENT_OTHER): Payer: BC Managed Care – PPO

## 2015-02-10 VITALS — BP 134/64 | HR 62 | Temp 97.5°F | Resp 18 | Ht 66.0 in | Wt 192.0 lb

## 2015-02-10 DIAGNOSIS — C9110 Chronic lymphocytic leukemia of B-cell type not having achieved remission: Secondary | ICD-10-CM

## 2015-02-10 LAB — CBC WITH DIFFERENTIAL/PLATELET
BASO%: 0.5 % (ref 0.0–2.0)
Basophils Absolute: 0.1 10*3/uL (ref 0.0–0.1)
EOS ABS: 0.4 10*3/uL (ref 0.0–0.5)
EOS%: 1.3 % (ref 0.0–7.0)
HCT: 39.7 % (ref 34.8–46.6)
HGB: 12.7 g/dL (ref 11.6–15.9)
LYMPH#: 24.8 10*3/uL — AB (ref 0.9–3.3)
LYMPH%: 83.2 % — ABNORMAL HIGH (ref 14.0–49.7)
MCH: 28.5 pg (ref 25.1–34.0)
MCHC: 32 g/dL (ref 31.5–36.0)
MCV: 89 fL (ref 79.5–101.0)
MONO#: 0.8 10*3/uL (ref 0.1–0.9)
MONO%: 2.6 % (ref 0.0–14.0)
NEUT%: 12.4 % — AB (ref 38.4–76.8)
NEUTROS ABS: 3.7 10*3/uL (ref 1.5–6.5)
Platelets: 145 10*3/uL (ref 145–400)
RBC: 4.46 10*6/uL (ref 3.70–5.45)
RDW: 15.7 % — ABNORMAL HIGH (ref 11.2–14.5)
WBC: 29.8 10*3/uL — AB (ref 3.9–10.3)

## 2015-02-10 LAB — COMPREHENSIVE METABOLIC PANEL (CC13)
ALBUMIN: 3.7 g/dL (ref 3.5–5.0)
ALK PHOS: 226 U/L — AB (ref 40–150)
ALT: 18 U/L (ref 0–55)
ANION GAP: 6 meq/L (ref 3–11)
AST: 22 U/L (ref 5–34)
BUN: 13.1 mg/dL (ref 7.0–26.0)
CHLORIDE: 106 meq/L (ref 98–109)
CO2: 28 mEq/L (ref 22–29)
CREATININE: 0.8 mg/dL (ref 0.6–1.1)
Calcium: 9.3 mg/dL (ref 8.4–10.4)
EGFR: 82 mL/min/{1.73_m2} — ABNORMAL LOW (ref >90)
Glucose: 88 mg/dl (ref 70–140)
POTASSIUM: 4.4 meq/L (ref 3.5–5.1)
Sodium: 140 mEq/L (ref 136–145)
Total Bilirubin: 0.72 mg/dL (ref 0.20–1.20)
Total Protein: 6.4 g/dL (ref 6.4–8.3)

## 2015-02-10 LAB — LACTATE DEHYDROGENASE (CC13): LDH: 161 U/L (ref 125–245)

## 2015-02-10 MED ORDER — IBRUTINIB 140 MG PO CAPS: 420.0000 mg | ORAL_CAPSULE | Freq: Every day | ORAL | Status: DC

## 2015-02-10 NOTE — Telephone Encounter (Signed)
Appointments made and avs printed for patient °

## 2015-02-10 NOTE — Progress Notes (Signed)
ID: Maria [name]   DOB: 1951-03-16  MR#: [id number]  YHC#:[id number]  PCP: Marton Redwood, MD GYN:  SU:  OTHER MD:  CHIEF COMPLAINT: Small lymphocytic lymphoma  CURRENT TREATMENT: Ibrutinib   HISTORY OF PRESENT ILLNESS: From the original intake note:  Maria [name] developed what she thought was "the flu" in December of 2002. She noted a large lymph node developing in her right anterior cervical area. 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   Her subsequent history is as detailed below   INTERVAL HISTORY: Maria [name] returns today for followup of her chronic lymphoid leukemia/ small lymphocytic lymphoma.. She is tolerating the ibrutinib well. Aside from that making her little sleepy, which actually helps lower sleep better at night, she reports no side effects from it.  REVIEW OF SYSTEMS: Maria [name] has had no unexplained weakness or weight loss, and has actually gained a few pounds, which concerns her. They have been no fevers or drenching sweats. There has been no rash or pruritus. Her knees do hurt her and as a result she is not exercising as much as she would like. She is also concerned that certain patients may be especially contagious, for example 1 recently who had tuberculosis. She usually asks not to be assigned to those patients which she would like a little direction regarding what she is actually at risk for. Aside from these issues a detailed review of systems today was stable  PAST MEDICAL HISTORY: Past Medical History  Diagnosis Date  . Allergy   . Asthma   . Cancer     chronic lymphacytic leukemia  . Diabetes mellitus   . Hyperlipidemia   . Hypertension   . Thyroid disease     hypothyroidism  . Arthritis     PAST SURGICAL HISTORY: Past Surgical History  Procedure Laterality Date  . Abdominal hysterectomy    . Strabismus surgery    . Tonsillectomy    . Lymph node dissection      FAMILY  HISTORY Family History  Problem Relation Age of Onset  . Colon cancer Neg Hx   . Pancreatic cancer Neg Hx   . Rectal cancer Neg Hx   . Stomach cancer Neg Hx   . Liver cancer Mother   . Heart disease Father   . Breast cancer Sister    The patient's father died at the age of 39 from a myocardial infarction. The patient's mother died at the age of 30 from primary liver carcinoma. The patient had no brothers. Her one sister, Maria [name], has a history of breast cancer  GYNECOLOGIC HISTORY: Menarche at around age 14. The patient is GX P0. She underwent simple hysterectomy without salpingo-oophorectomy in [place]: RN at [employer], working in the [place], 8h/d x 5d/week. She lives by herself with her caps [place] and [place]. She attends a local [employer]: Not in place  HEALTH MAINTENANCE: History  Substance Use Topics  . Smoking status: Never Smoker   . Smokeless tobacco: Never Used  . Alcohol Use: Yes     Comment: occasional alcohol intake; once or twice monthly     Colonoscopy:  PAP:  Bone density:  Lipid panel:  Allergies  Allergen Reactions  . Cephalexin     REACTION: Rash  . Lactose Intolerance (Gi)     Diarrhea, gas bloating  . Iohexol Swelling and Rash    Current Outpatient Prescriptions  Medication  Sig Dispense Refill  . allopurinol (ZYLOPRIM) 300 MG tablet Take 1 tablet (300 mg total) by mouth daily. 90 tablet 4  . Fluticasone-Salmeterol (ADVAIR DISKUS) 100-50 MCG/DOSE AEPB Inhale 1 puff into the lungs every 12 (twelve) hours as needed.     . ibrutinib (IMBRUVICA) 140 MG capsul Take 3 capsules (420 mg total) by mouth daily. 90 capsule 6  . LORazepam (ATIVAN) 1 MG tablet Take 1 mg by mouth as needed.     . naproxen sodium (ANAPROX) 220 MG tablet Take 220 mg by mouth as needed.      Marland Kitchen SYNTHROID 88 MCG tablet Take 88 mcg by mouth daily.      No current facility-administered medications for this visit.    OBJECTIVE: Middle-aged  white woman in no acute distress Filed Vitals:   02/10/15 1147  BP: 134/64  [name]: 62  Temp: 97.5 F (36.4 C)  Resp: 18     Body mass index is 31 kg/(m^2).    ECOG FS: 0  Sclerae unicteric, pupils round and equal Oropharynx clear and moist-- no thrush or other lesions The right supraclavicular adenopathy is now a little harder to find. In the right axilla and a very large lymph node previously noted is now about 3 cm, flatter, and softer. The left axillary adenopathy is also smaller. Lungs no rales or rhonchi Heart regular rate and rhythm Abd soft, nontender, positive bowel sounds MSK no focal spinal tenderness, no upper extremity lymphedema Neuro: nonfocal, well oriented, appropriate affect Breasts: Deferred    LAB RESULTS: Lab Results  Component Value Date   WBC 29.8* 02/10/2015   NEUTROABS 3.7 02/10/2015   HGB 12.7 02/10/2015   HCT 39.7 02/10/2015   MCV 89.0 02/10/2015   PLT 145 02/10/2015      Chemistry      Component Value Date/Time   NA 141 12/22/2014 0912   NA 138 07/24/2010 1356   K 4.3 12/22/2014 0912   K 4.1 07/24/2010 1356   CL 105 07/24/2010 1356   CO2 27 12/22/2014 0912   CO2 26 07/24/2010 1356   BUN 14.1 12/22/2014 0912   BUN 13 07/24/2010 1356   CREATININE 0.7 12/22/2014 0912   CREATININE 0.75 07/24/2010 1356   GLU 101* 10/18/2009 1542      Component Value Date/Time   CALCIUM 8.8 12/22/2014 0912   CALCIUM 9.2 07/24/2010 1356   ALKPHOS 257* 12/22/2014 0912   ALKPHOS 81 07/24/2010 1356   AST 57* 12/22/2014 0912   AST 24 07/24/2010 1356   ALT 45 12/22/2014 0912   ALT 26 07/24/2010 1356   BILITOT 0.65 12/22/2014 0912   BILITOT 0.6 07/24/2010 1356       No results found for: LABCA2  No components found for: [id number]  No results for input(s): INR in the last 168 hours.  Urinalysis    Component Value Date/Time   COLORURINE LT. YELLOW 01/24/2010 1017   APPEARANCEUR CLEAR 01/24/2010 1017   LABSPEC <=1.005 01/24/2010 1017   PHURINE 5.5  01/24/2010 1017   GLUCOSEU NEGATIVE 01/24/2010 [place] 01/24/2010 1017   KETONESUR NEGATIVE 01/24/2010 1017   UROBILINOGEN 0.2 01/24/2010 1017   NITRITE NEGATIVE 01/24/2010 [place] 01/24/2010 1017    STUDIES: No results found.  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 There was no CD10 or cyclin D1 positivity identified  (4) started ibrutinib at 420 mg/ day 08/09/2014  PLAN:  Kassey is tolerating the ibrutinib remarkably well. It continues to work for her. She is obtaining it at an excellent price. The plan is to continue this at least for the next several months. We will obtain a repeat PET scan before her next visit here in December.  She does need to do some rehabbing of her knees if she wants to remain functional. Unfortunately there is no white near where she lives. She might consider joining a local gym and getting a trainer temporarily just to get that started through she can then continue on her own.  We will start checking IVIG levels with her routine labs so we can replace those as needed. She is appropriately concerned regarding infections and she of course has decreased immunity because of her diagnosis and its treatment. I will try to find [facility] specific information for her. Her employers are willing to shift her to different patients if someone is particularly risky. She just needs more information regarding that.  She has a good understanding of the overall plan. She agrees with it. She knows the goal of  treatment in her case is control. She will call with any problems that may develop before next visit here.  Adiya Selmer C    02/10/2015

## 2015-02-10 NOTE — Addendum Note (Signed)
Addended by: Prentiss Bells on: 02/10/2015 04:42 PM   Modules accepted: Medications

## 2015-02-11 ENCOUNTER — Other Ambulatory Visit: Payer: Self-pay

## 2015-02-11 DIAGNOSIS — C9110 Chronic lymphocytic leukemia of B-cell type not having achieved remission: Secondary | ICD-10-CM

## 2015-02-11 MED ORDER — IBRUTINIB 140 MG PO CAPS: 420.0000 mg | ORAL_CAPSULE | Freq: Every day | ORAL | Status: DC

## 2015-02-11 NOTE — Telephone Encounter (Signed)
Printed prescription faxed to [facility].  Sent to scan.

## 2015-02-18 NOTE — Telephone Encounter (Signed)
[facility] sent facsimile confirmation of Imbruvica prescription shipment.  Imbruvica was shipped on 02-17-2015 with next business day delivery.

## 2015-04-07 ENCOUNTER — Other Ambulatory Visit (HOSPITAL_BASED_OUTPATIENT_CLINIC_OR_DEPARTMENT_OTHER): Payer: BC Managed Care – PPO

## 2015-04-07 DIAGNOSIS — C9110 Chronic lymphocytic leukemia of B-cell type not having achieved remission: Secondary | ICD-10-CM | POA: Diagnosis not present

## 2015-04-07 LAB — CBC WITH DIFFERENTIAL/PLATELET
BASO%: 0.4 % (ref 0.0–2.0)
Basophils Absolute: 0.1 10*3/uL (ref 0.0–0.1)
EOS%: 1.7 % (ref 0.0–7.0)
Eosinophils Absolute: 0.3 10*3/uL (ref 0.0–0.5)
HCT: 41.4 % (ref 34.8–46.6)
HGB: 13.2 g/dL (ref 11.6–15.9)
LYMPH%: 72.5 % — AB (ref 14.0–49.7)
MCH: 28.4 pg (ref 25.1–34.0)
MCHC: 31.9 g/dL (ref 31.5–36.0)
MCV: 89 fL (ref 79.5–101.0)
MONO#: 0.5 10*3/uL (ref 0.1–0.9)
MONO%: 2.8 % (ref 0.0–14.0)
NEUT#: 4.1 10*3/uL (ref 1.5–6.5)
NEUT%: 22.6 % — AB (ref 38.4–76.8)
PLATELETS: 123 10*3/uL — AB (ref 145–400)
RBC: 4.65 10*6/uL (ref 3.70–5.45)
RDW: 15.3 % — AB (ref 11.2–14.5)
WBC: 18.2 10*3/uL — ABNORMAL HIGH (ref 3.9–10.3)
lymph#: 13.2 10*3/uL — ABNORMAL HIGH (ref 0.9–3.3)

## 2015-04-07 LAB — COMPREHENSIVE METABOLIC PANEL (CC13)
ALK PHOS: 191 U/L — AB (ref 40–150)
ALT: 27 U/L (ref 0–55)
AST: 30 U/L (ref 5–34)
Albumin: 3.8 g/dL (ref 3.5–5.0)
Anion Gap: 5 mEq/L (ref 3–11)
BUN: 13.1 mg/dL (ref 7.0–26.0)
CHLORIDE: 109 meq/L (ref 98–109)
CO2: 27 meq/L (ref 22–29)
Calcium: 9.4 mg/dL (ref 8.4–10.4)
Creatinine: 0.7 mg/dL (ref 0.6–1.1)
EGFR: 85 mL/min/{1.73_m2} — ABNORMAL LOW (ref >90)
GLUCOSE: 105 mg/dL (ref 70–140)
POTASSIUM: 4 meq/L (ref 3.5–5.1)
Sodium: 140 mEq/L (ref 136–145)
Total Bilirubin: 0.58 mg/dL (ref 0.20–1.20)
Total Protein: 6.3 g/dL — ABNORMAL LOW (ref 6.4–8.3)

## 2015-04-07 LAB — LACTATE DEHYDROGENASE (CC13): LDH: 140 U/L (ref 125–245)

## 2015-04-08 LAB — IGG, IGA, IGM
IGA: 168 mg/dL (ref 69–380)
IgG (Immunoglobin G), Serum: 576 mg/dL — ABNORMAL LOW (ref 690–1700)
IgM, Serum: 19 mg/dL — ABNORMAL LOW (ref 52–322)

## 2015-04-18 ENCOUNTER — Encounter: Payer: Self-pay | Admitting: Oncology

## 2015-04-18 NOTE — Progress Notes (Signed)
I placed fmla forms on desk of nurse for dr. magrinat °

## 2015-04-22 ENCOUNTER — Encounter: Payer: Self-pay | Admitting: Oncology

## 2015-04-22 NOTE — Progress Notes (Signed)
I advised the patient dr. Marjie Skiff is not in the office this week and will be back on Monday. She wants the forms faxed and a copy mailed to her address.

## 2015-04-27 ENCOUNTER — Encounter: Payer: Self-pay | Admitting: Oncology

## 2015-04-27 NOTE — Progress Notes (Signed)
Faxed fmla form to [place] @ [phone]

## 2015-05-02 ENCOUNTER — Encounter: Payer: Self-pay | Admitting: Oncology

## 2015-05-02 NOTE — Progress Notes (Signed)
I mailed copy of forms from [employer] to the patient. See prev notes of being faxed.

## 2015-05-04 ENCOUNTER — Encounter: Payer: Self-pay | Admitting: Oncology

## 2015-05-04 NOTE — Progress Notes (Signed)
Made cprrections to fmla forms and refaxed [phone] and patient request copy sent to her also.

## 2015-06-02 ENCOUNTER — Other Ambulatory Visit (HOSPITAL_BASED_OUTPATIENT_CLINIC_OR_DEPARTMENT_OTHER): Payer: BC Managed Care – PPO

## 2015-06-02 DIAGNOSIS — C9110 Chronic lymphocytic leukemia of B-cell type not having achieved remission: Secondary | ICD-10-CM | POA: Diagnosis not present

## 2015-06-02 LAB — COMPREHENSIVE METABOLIC PANEL (CC13)
ALBUMIN: 3.5 g/dL (ref 3.5–5.0)
ALK PHOS: 186 U/L — AB (ref 40–150)
ALT: 35 U/L (ref 0–55)
ANION GAP: 6 meq/L (ref 3–11)
AST: 39 U/L — ABNORMAL HIGH (ref 5–34)
BILIRUBIN TOTAL: 0.68 mg/dL (ref 0.20–1.20)
BUN: 19.1 mg/dL (ref 7.0–26.0)
CALCIUM: 9 mg/dL (ref 8.4–10.4)
CO2: 28 mEq/L (ref 22–29)
Chloride: 107 mEq/L (ref 98–109)
Creatinine: 0.8 mg/dL (ref 0.6–1.1)
EGFR: 81 mL/min/{1.73_m2} — AB (ref >90)
Glucose: 129 mg/dl (ref 70–140)
POTASSIUM: 4.1 meq/L (ref 3.5–5.1)
Sodium: 140 mEq/L (ref 136–145)
TOTAL PROTEIN: 5.9 g/dL — AB (ref 6.4–8.3)

## 2015-06-02 LAB — CBC WITH DIFFERENTIAL/PLATELET
BASO%: 0.5 % (ref 0.0–2.0)
BASOS ABS: 0.1 10*3/uL (ref 0.0–0.1)
EOS%: 2.2 % (ref 0.0–7.0)
Eosinophils Absolute: 0.3 10*3/uL (ref 0.0–0.5)
HCT: 40.9 % (ref 34.8–46.6)
HEMOGLOBIN: 13 g/dL (ref 11.6–15.9)
LYMPH%: 69.7 % — ABNORMAL HIGH (ref 14.0–49.7)
MCH: 28.2 pg (ref 25.1–34.0)
MCHC: 31.8 g/dL (ref 31.5–36.0)
MCV: 88.7 fL (ref 79.5–101.0)
MONO#: 0.5 10*3/uL (ref 0.1–0.9)
MONO%: 4.5 % (ref 0.0–14.0)
NEUT#: 2.8 10*3/uL (ref 1.5–6.5)
NEUT%: 23.1 % — ABNORMAL LOW (ref 38.4–76.8)
Platelets: 130 10*3/uL — ABNORMAL LOW (ref 145–400)
RBC: 4.61 10*6/uL (ref 3.70–5.45)
RDW: 15.2 % — AB (ref 11.2–14.5)
WBC: 11.9 10*3/uL — ABNORMAL HIGH (ref 3.9–10.3)
lymph#: 8.3 10*3/uL — ABNORMAL HIGH (ref 0.9–3.3)

## 2015-06-02 LAB — LACTATE DEHYDROGENASE (CC13): LDH: 150 U/L (ref 125–245)

## 2015-06-03 LAB — IGG, IGA, IGM
IgA: 164 mg/dL (ref 69–380)
IgG (Immunoglobin G), Serum: 475 mg/dL — ABNORMAL LOW (ref 690–1700)
IgM, Serum: 19 mg/dL — ABNORMAL LOW (ref 52–322)

## 2015-06-06 ENCOUNTER — Other Ambulatory Visit: Payer: Self-pay | Admitting: Oncology

## 2015-07-21 ENCOUNTER — Encounter: Payer: Self-pay | Admitting: Oncology

## 2015-07-21 NOTE — Progress Notes (Signed)
Per [facility] imbruvica was shipped via [employer]ex °

## 2015-07-28 ENCOUNTER — Other Ambulatory Visit (HOSPITAL_BASED_OUTPATIENT_CLINIC_OR_DEPARTMENT_OTHER): Payer: BC Managed Care – PPO

## 2015-07-28 DIAGNOSIS — C9110 Chronic lymphocytic leukemia of B-cell type not having achieved remission: Secondary | ICD-10-CM | POA: Diagnosis not present

## 2015-07-28 LAB — CBC WITH DIFFERENTIAL/PLATELET
BASO%: 0.4 % (ref 0.0–2.0)
Basophils Absolute: 0 10*3/uL (ref 0.0–0.1)
EOS%: 2.4 % (ref 0.0–7.0)
Eosinophils Absolute: 0.2 10*3/uL (ref 0.0–0.5)
HEMATOCRIT: 43.5 % (ref 34.8–46.6)
HEMOGLOBIN: 14 g/dL (ref 11.6–15.9)
LYMPH#: 5.3 10*3/uL — AB (ref 0.9–3.3)
LYMPH%: 58.4 % — ABNORMAL HIGH (ref 14.0–49.7)
MCH: 28.2 pg (ref 25.1–34.0)
MCHC: 32.2 g/dL (ref 31.5–36.0)
MCV: 87.5 fL (ref 79.5–101.0)
MONO#: 0.5 10*3/uL (ref 0.1–0.9)
MONO%: 5.7 % (ref 0.0–14.0)
NEUT%: 33.1 % — ABNORMAL LOW (ref 38.4–76.8)
NEUTROS ABS: 3 10*3/uL (ref 1.5–6.5)
Platelets: 142 10*3/uL — ABNORMAL LOW (ref 145–400)
RBC: 4.97 10*6/uL (ref 3.70–5.45)
RDW: 14.8 % — AB (ref 11.2–14.5)
WBC: 9 10*3/uL (ref 3.9–10.3)

## 2015-07-28 LAB — COMPREHENSIVE METABOLIC PANEL (CC13)
ALBUMIN: 3.8 g/dL (ref 3.5–5.0)
ALK PHOS: 159 U/L — AB (ref 40–150)
ALT: 21 U/L (ref 0–55)
AST: 23 U/L (ref 5–34)
Anion Gap: 8 mEq/L (ref 3–11)
BILIRUBIN TOTAL: 0.61 mg/dL (ref 0.20–1.20)
BUN: 14.2 mg/dL (ref 7.0–26.0)
CO2: 24 meq/L (ref 22–29)
Calcium: 9.6 mg/dL (ref 8.4–10.4)
Chloride: 108 mEq/L (ref 98–109)
Creatinine: 0.8 mg/dL (ref 0.6–1.1)
EGFR: 76 mL/min/{1.73_m2} — AB (ref >90)
GLUCOSE: 111 mg/dL (ref 70–140)
POTASSIUM: 4.2 meq/L (ref 3.5–5.1)
Sodium: 139 mEq/L (ref 136–145)
TOTAL PROTEIN: 6.4 g/dL (ref 6.4–8.3)

## 2015-07-28 LAB — LACTATE DEHYDROGENASE (CC13): LDH: 145 U/L (ref 125–245)

## 2015-07-29 LAB — IGG, IGA, IGM
IGA: 172 mg/dL (ref 69–380)
IGM, SERUM: 22 mg/dL — AB (ref 52–322)
IgG (Immunoglobin G), Serum: 476 mg/dL — ABNORMAL LOW (ref 690–1700)

## 2015-08-09 ENCOUNTER — Ambulatory Visit (HOSPITAL_COMMUNITY)
Admission: RE | Admit: 2015-08-09 | Discharge: 2015-08-09 | Disposition: A | Discharge status: Home / Self Care | Payer: BC Managed Care – PPO | Source: Ambulatory Visit | Attending: Oncology | Admitting: Oncology

## 2015-08-09 DIAGNOSIS — C9110 Chronic lymphocytic leukemia of B-cell type not having achieved remission: Secondary | ICD-10-CM | POA: Insufficient documentation

## 2015-08-09 DIAGNOSIS — R590 Localized enlarged lymph nodes: Secondary | ICD-10-CM | POA: Insufficient documentation

## 2015-08-09 DIAGNOSIS — K769 Liver disease, unspecified: Secondary | ICD-10-CM | POA: Diagnosis not present

## 2015-08-09 DIAGNOSIS — R911 Solitary pulmonary nodule: Secondary | ICD-10-CM | POA: Diagnosis not present

## 2015-08-09 LAB — GLUCOSE, CAPILLARY: Glucose-Capillary: 95 mg/dL (ref 65–99)

## 2015-08-09 MED ORDER — FLUDEOXYGLUCOSE F - 18 (FDG) INJECTION
9.4000 | Freq: Once | INTRAVENOUS | Status: DC | PRN
Start: 2015-08-09 — End: 2015-08-15

## 2015-08-11 ENCOUNTER — Ambulatory Visit (HOSPITAL_BASED_OUTPATIENT_CLINIC_OR_DEPARTMENT_OTHER): Payer: BC Managed Care – PPO | Admitting: Oncology

## 2015-08-11 ENCOUNTER — Telehealth: Payer: Self-pay | Admitting: Oncology

## 2015-08-11 VITALS — BP 147/50 | HR 65 | Temp 98.1°F | Resp 18 | Ht 66.0 in | Wt 201.0 lb

## 2015-08-11 DIAGNOSIS — C9110 Chronic lymphocytic leukemia of B-cell type not having achieved remission: Secondary | ICD-10-CM

## 2015-08-11 DIAGNOSIS — M25569 Pain in unspecified knee: Secondary | ICD-10-CM

## 2015-08-11 DIAGNOSIS — R911 Solitary pulmonary nodule: Secondary | ICD-10-CM

## 2015-08-11 MED ORDER — IBRUTINIB 140 MG PO CAPS: 420.0000 mg | ORAL_CAPSULE | Freq: Every day | ORAL | Status: DC

## 2015-08-11 NOTE — Progress Notes (Signed)
ID: Maria [name]   DOB: Jul 07, 1951  MR#: [id number]  [id number]  PCP: Marton Redwood, MD GYN:  SU:  OTHER MD:  CHIEF COMPLAINT: Small lymphocytic lymphoma  CURRENT TREATMENT: Ibrutinib   HISTORY OF PRESENT ILLNESS: From the original intake note:  Maria [name] developed what she thought was "the flu" in December of 2002. She noted a large lymph node developing in her right anterior cervical area. 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   Her subsequent history is as detailed below   INTERVAL HISTORY: Maria [name] returns today for continuing followup of her chronic lymphoid leukemia/ small lymphocytic lymphoma.. She has been on iron gluconate now almost exactly one year. She is tolerating it well, with occasional bruising as the only side effects. She has not had any intercurrent infections. She obtain said at $10 a month, which is the mailing cost.  REVIEW OF SYSTEMS: [name]  Big problem is her right knee. She had it injected recently and she can walk with it but she is going to have to do something to get that taken care of. She worries about infection problems with knee surgery because of her immunocompromise status. She has some stress urinary incontinence,  And some back and joint pain which is not more intense or persistent than before except for the knee problem. She feels forgetful at times. A detailed review of systems was otherwise stable  PAST MEDICAL HISTORY: Past Medical History  Diagnosis Date  . Allergy   . Asthma   . Cancer     chronic lymphacytic leukemia  . Diabetes mellitus   . Hyperlipidemia   . Hypertension   . Thyroid disease     hypothyroidism  . Arthritis     PAST SURGICAL HISTORY: Past Surgical History  Procedure Laterality Date  . Abdominal hysterectomy    . Strabismus surgery    . Tonsillectomy    . Lymph node dissection      FAMILY HISTORY Family History  Problem  Relation Age of Onset  . Colon cancer Neg Hx   . Pancreatic cancer Neg Hx   . Rectal cancer Neg Hx   . Stomach cancer Neg Hx   . Liver cancer Mother   . Heart disease Father   . Breast cancer Sister    The patient's father died at the age of 59 from a myocardial infarction. The patient's mother died at the age of 93 from primary liver carcinoma. The patient had no brothers. Her one sister, [name], has a history of breast cancer  GYNECOLOGIC HISTORY: Menarche at around age 55. The patient is GX P0. 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 She lives by herself with her cats [place] and [place]. She attends a local [employer]: Not in place  HEALTH MAINTENANCE: Social History  Substance Use Topics  . Smoking status: Never Smoker   . Smokeless tobacco: Never Used  . Alcohol Use: Yes     Comment: occasional alcohol intake; once or twice monthly     Colonoscopy:  PAP:  Bone density:  Lipid panel:  Allergies  Allergen Reactions  . Cephalexin     REACTION: Rash  . Lactose Intolerance (Gi)     Diarrhea, gas bloating  . Iohexol Swelling and Rash    Current Outpatient Prescriptions  Medication Sig Dispense Refill  .  allopurinol (ZYLOPRIM) 300 MG tablet Take 1 tablet (300 mg total) by mouth daily. 90 tablet 4  . Fluticasone-Salmeterol (ADVAIR DISKUS) 100-50 MCG/DOSE AEPB Inhale 1 puff into the lungs every 12 (twelve) hours as needed.     . ibrutinib (IMBRUVICA) 140 MG capsul Take 3 capsules (420 mg total) by mouth daily. 90 capsule 6  . LORazepam (ATIVAN) 1 MG tablet Take 1 mg by mouth as needed.     . naproxen sodium (ANAPROX) 220 MG tablet Take 220 mg by mouth as needed.      Marland Kitchen SYNTHROID 88 MCG tablet Take 88 mcg by mouth daily.      No current facility-administered medications for this visit.   Facility-Administered Medications Ordered in Other  Visits  Medication Dose Route Frequency Provider Last Rate Last Dose  . fludeoxyglucose F - 64 (FDG) injection 9.4 milli Curie  9.4 milli Curie Intravenous Once PRN Chauncey Cruel, MD        OBJECTIVE: Middle-aged white woman who appears stated age 22 Vitals:   08/11/15 1140  BP: 147/50  [name]: 65  Temp: 98.1 F (36.7 C)  Resp: 18     Body mass index is 32.46 kg/(m^2).    ECOG FS: 0  Sclerae unicteric, EOMs intact Oropharynx clear, dentition in good repair No cervical or supraclavicular adenopathy , no axillary or inguinal adenopathy Lungs no rales or rhonchi Heart regular rate and rhythm Abd soft, nontender, positive bowel sounds MSK no focal spinal tenderness,  No right knee erythema or swelling Neuro: nonfocal, well oriented, appropriate affect Breasts:  deferred    LAB RESULTS: Lab Results  Component Value Date   WBC 9.0 07/28/2015   NEUTROABS 3.0 07/28/2015   HGB 14.0 07/28/2015   HCT 43.5 07/28/2015   MCV 87.5 07/28/2015   PLT 142* 07/28/2015      Chemistry      Component Value Date/Time   NA 139 07/28/2015 1031   NA 138 07/24/2010 1356   K 4.2 07/28/2015 1031   K 4.1 07/24/2010 1356   CL 105 07/24/2010 1356   CO2 24 07/28/2015 1031   CO2 26 07/24/2010 1356   BUN 14.2 07/28/2015 1031   BUN 13 07/24/2010 1356   CREATININE 0.8 07/28/2015 1031   CREATININE 0.75 07/24/2010 1356   GLU 101* 10/18/2009 1542      Component Value Date/Time   CALCIUM 9.6 07/28/2015 1031   CALCIUM 9.2 07/24/2010 1356   ALKPHOS 159* 07/28/2015 1031   ALKPHOS 81 07/24/2010 1356   AST 23 07/28/2015 1031   AST 24 07/24/2010 1356   ALT 21 07/28/2015 1031   ALT 26 07/24/2010 1356   BILITOT 0.61 07/28/2015 1031   BILITOT 0.6 07/24/2010 1356       No results found for: LABCA2  No components found for: [id number]  No results for input(s): INR in the last 168 hours.  Urinalysis    Component Value Date/Time   COLORURINE LT. YELLOW 01/24/2010 1017   APPEARANCEUR CLEAR  01/24/2010 1017   LABSPEC <=1.005 01/24/2010 1017   PHURINE 5.5 01/24/2010 1017   GLUCOSEU NEGATIVE 01/24/2010 [place] 01/24/2010 1017   KETONESUR NEGATIVE 01/24/2010 1017   UROBILINOGEN 0.2 01/24/2010 1017   NITRITE NEGATIVE 01/24/2010 1017   LEUKOCYTESUR NEGATIVE 01/24/2010 1017    STUDIES: Nm Pet Image Restag (ps) Skull Base To Thigh  08/09/2015  CLINICAL DATA:  Subsequent treatment strategy for chronic lymphocytic leukemia. EXAM: NUCLEAR MEDICINE PET SKULL BASE TO THIGH TECHNIQUE: 9.4  mCi F-18 FDG was injected intravenously. Full-ring PET imaging was performed from the skull base to thigh after the radiotracer. CT data was obtained and used for attenuation correction and anatomic localization. FASTING BLOOD GLUCOSE:  Value: 95 mg/dl COMPARISON:  07/27/2014 PET-CT. FINDINGS: NECK There are no residual hypermetabolic or pathologically enlarged lymph nodes in the neck. There are non hypermetabolic coarsely calcified bilateral thyroid lobe nodules, not appreciably changed, largest 1.9 cm in the right thyroid lobe. CHEST The bilateral axillary lymphadenopathy has significantly decreased in size, with several residual mildly enlarged bilateral axillary lymph nodes. There is residual mild hypermetabolism within a mildly enlarged 1.1 cm right inferior axillary node (series 4/ image 82) with max SUV 3.1, decreased in size from 2.1 cm, previous max SUV 3.0, not appreciably changed in metabolism. There are no residual hypermetabolic left axillary nodes. There are no residual hypermetabolic or pathologically enlarged mediastinal or hilar nodes. Left anterior descending, left circumflex and right coronary atherosclerosis . Stable 3 mm solid right middle lobe pulmonary nodule (series 8/image 46). No acute consolidative airspace disease or new significant pulmonary nodules. Stable mosaic attenuation in the lungs. ABDOMEN/PELVIS There are three indeterminate non hypermetabolic hypodense liver  lesions, including a stable 2.4 cm segment 5 right liver lobe hypodense lesion (series 4/image 124), a 2.2 cm segment 5 right liver lobe hypodense lesion increased from 1.7 cm (4/125) and a stable 1.4 cm segment 8 right liver lobe hypodense lesion (4/103). There are no residual hypermetabolic gastrohepatic ligament, porta hepatis, portacaval, para celiac, aortocaval, left paraaortic or mesenteric nodes, which have all significantly decreased in size. There is residual mild hypermetabolism within a mildly enlarged 1.1 cm medial right common iliac node (4/151) with max SUV 3.3, decreased from 3.4 cm with previous max SUV 5.4. There is residual mild hypermetabolism within a mildly enlarged 1.6 cm right external iliac node (4/171) with max SUV 4.0, decreased from 4.5 cm with max SUV 4.3. There is residual mild hypermetabolism within a mildly enlarged 1.4 cm left external iliac node (4/162) with max SUV 3.9, decreased from 3.0 cm with max SUV 4.0. No abnormal hypermetabolic activity within the liver, pancreas, adrenal glands, or spleen. The spleen is now normal in size with craniocaudal splenic length 12.4 cm, decreased from 16.4 cm. There is layering mildly hyperdense material within the gallbladder, which could represent tiny gallstones or sludge. Mild sigmoid diverticulosis. Status post hysterectomy, with no abnormal findings at the vaginal cuff. SKELETON No focal hypermetabolic activity to suggest skeletal metastasis. Soft tissue anchors are present in the right humeral head. IMPRESSION: 1. Significant interval treatment response, with residual mild hypermetabolism within a few right axillary and bilateral pelvic nodes. The previously described enlarged nodes have all significantly decreased in size. 2. No new sites of hypermetabolic disease. 3. Spleen is decreased and now normal in size with no splenic hypermetabolism. 4. Right middle lobe 3 mm pulmonary nodule, probably benign, and for which 12 month stability has  been demonstrated. 5. Three indeterminate non hypermetabolic hypodense liver lesions, one of which has mildly increased in size. Consider correlation with MRI abdomen with and without intravenous contrast. Electronically Signed   By: Ilona Sorrel M.D.   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 There was no CD10 or cyclin D1 positivity identified  (4) started ibrutinib at 420 mg/ day 08/09/2014  PLAN:  Dalyah is having a continuing excellent response from the ibrutinib.  Her PET scan is very favorable.Her white cell count which had initially risen, as expected, has finally normalized. I think it's fine for her to go off the allopurinol at this point.   the incidentally noted 3 mm right middle lobe pulmonary nodule is stable and requires only follow-up.    we are continuing the ibrutinib at the same dose. Perhaps at some point if we have absolutely no activity on a PET scan we can consider dropping the dose some. For now however she is tolerating it quite well and obtains it at a good price so there is no need to make any changes.  She has significant disability relating to her right knee. She may want to get this taken care of in the next few months. I don't see any problem with her continue the improvement, write through the surgery. She has adequate IgG levels in particular.   Otherwise she will see Dr. Brigitte [name] in February and she will return to see me in May. We will consider repeating a PET scan a year from now. She knows to call for any  problems that may develop before her next visit here. [name],GUSTAV C    08/11/2015

## 2015-08-11 NOTE — Telephone Encounter (Signed)
GAVE PATIENT AVS REPORT AND APPOINTMENTS FOR MAMMO/BONE DENSITY IN [place] AND LAB/FU MAY AND June.

## 2015-09-16 ENCOUNTER — Ambulatory Visit
Admission: RE | Admit: 2015-09-16 | Discharge: 2015-09-16 | Disposition: A | Discharge status: Home / Self Care | Payer: BC Managed Care – PPO | Source: Ambulatory Visit | Attending: Oncology | Admitting: Oncology

## 2015-09-16 DIAGNOSIS — C9110 Chronic lymphocytic leukemia of B-cell type not having achieved remission: Secondary | ICD-10-CM

## 2015-09-17 ENCOUNTER — Encounter: Payer: Self-pay | Admitting: Oncology

## 2015-09-17 ENCOUNTER — Other Ambulatory Visit: Payer: Self-pay | Admitting: Oncology

## 2015-09-19 ENCOUNTER — Other Ambulatory Visit: Payer: Self-pay | Admitting: *Deleted

## 2015-09-22 ENCOUNTER — Other Ambulatory Visit (HOSPITAL_BASED_OUTPATIENT_CLINIC_OR_DEPARTMENT_OTHER): Payer: BC Managed Care – PPO

## 2015-09-22 DIAGNOSIS — C9110 Chronic lymphocytic leukemia of B-cell type not having achieved remission: Secondary | ICD-10-CM

## 2015-09-23 LAB — IGG, IGA, IGM
IGG (IMMUNOGLOBIN G), SERUM: 484 mg/dL — AB (ref 700–1600)
IgA, Qn, Serum: 163 mg/dL (ref 87–352)
IgM, Qn, Serum: 16 mg/dL — ABNORMAL LOW (ref 26–217)

## 2015-09-26 ENCOUNTER — Other Ambulatory Visit: Payer: Self-pay | Admitting: Oncology

## 2015-10-07 ENCOUNTER — Encounter: Payer: Self-pay | Admitting: Oncology

## 2015-10-07 NOTE — Progress Notes (Signed)
Recd fmla forms via fax 10/06/15

## 2015-10-07 NOTE — Progress Notes (Signed)
I placed forms for dr. Jana Hakim

## 2015-10-11 ENCOUNTER — Encounter: Payer: Self-pay | Admitting: Oncology

## 2015-10-11 NOTE — Progress Notes (Signed)
I faxed forms back to [employer] [phone]- noted in sharepoint.copy made for patient.

## 2015-10-11 NOTE — Progress Notes (Signed)
I called to let the patient know her copy is being mailed to her and I faxed it

## 2015-10-14 ENCOUNTER — Other Ambulatory Visit: Payer: Self-pay | Admitting: *Deleted

## 2015-10-14 DIAGNOSIS — C9110 Chronic lymphocytic leukemia of B-cell type not having achieved remission: Secondary | ICD-10-CM

## 2015-10-14 DIAGNOSIS — C8590 Non-Hodgkin lymphoma, unspecified, unspecified site: Secondary | ICD-10-CM

## 2015-10-14 MED ORDER — IBRUTINIB 140 MG PO CAPS: 420.0000 mg | ORAL_CAPSULE | Freq: Every day | ORAL | Status: DC

## 2015-10-17 ENCOUNTER — Encounter: Payer: Self-pay | Admitting: Oncology

## 2015-10-17 NOTE — Progress Notes (Signed)
Revised forms faxed to [employer]  [phone]. Just needed update that knee pain is not another diagnosis. It is related to CLL and the time is for her dr. Visits.

## 2015-10-18 ENCOUNTER — Other Ambulatory Visit (HOSPITAL_BASED_OUTPATIENT_CLINIC_OR_DEPARTMENT_OTHER): Payer: BC Managed Care – PPO

## 2015-10-18 DIAGNOSIS — C9110 Chronic lymphocytic leukemia of B-cell type not having achieved remission: Secondary | ICD-10-CM | POA: Diagnosis not present

## 2015-10-18 DIAGNOSIS — C8590 Non-Hodgkin lymphoma, unspecified, unspecified site: Secondary | ICD-10-CM

## 2015-10-18 LAB — COMPREHENSIVE METABOLIC PANEL
ALK PHOS: 141 U/L (ref 40–150)
ALT: 25 U/L (ref 0–55)
ANION GAP: 8 meq/L (ref 3–11)
AST: 32 U/L (ref 5–34)
Albumin: 3.8 g/dL (ref 3.5–5.0)
BILIRUBIN TOTAL: 0.61 mg/dL (ref 0.20–1.20)
BUN: 12.9 mg/dL (ref 7.0–26.0)
CALCIUM: 9.3 mg/dL (ref 8.4–10.4)
CO2: 26 meq/L (ref 22–29)
Chloride: 106 mEq/L (ref 98–109)
Creatinine: 0.8 mg/dL (ref 0.6–1.1)
EGFR: 75 mL/min/{1.73_m2} — AB (ref >90)
Glucose: 84 mg/dl (ref 70–140)
Potassium: 4.4 mEq/L (ref 3.5–5.1)
Sodium: 140 mEq/L (ref 136–145)
TOTAL PROTEIN: 6.3 g/dL — AB (ref 6.4–8.3)

## 2015-10-18 LAB — CBC WITH DIFFERENTIAL/PLATELET
BASO%: 1 % (ref 0.0–2.0)
Basophils Absolute: 0.1 10*3/uL (ref 0.0–0.1)
EOS ABS: 0.2 10*3/uL (ref 0.0–0.5)
EOS%: 3.4 % (ref 0.0–7.0)
HCT: 44 % (ref 34.8–46.6)
HGB: 14 g/dL (ref 11.6–15.9)
LYMPH%: 47.3 % (ref 14.0–49.7)
MCH: 27.5 pg (ref 25.1–34.0)
MCHC: 31.8 g/dL (ref 31.5–36.0)
MCV: 86.5 fL (ref 79.5–101.0)
MONO#: 0.4 10*3/uL (ref 0.1–0.9)
MONO%: 6.4 % (ref 0.0–14.0)
NEUT%: 41.9 % (ref 38.4–76.8)
NEUTROS ABS: 2.3 10*3/uL (ref 1.5–6.5)
PLATELETS: 114 10*3/uL — AB (ref 145–400)
RBC: 5.09 10*6/uL (ref 3.70–5.45)
RDW: 15.1 % — ABNORMAL HIGH (ref 11.2–14.5)
WBC: 5.5 10*3/uL (ref 3.9–10.3)
lymph#: 2.6 10*3/uL (ref 0.9–3.3)

## 2015-10-19 LAB — IGG, IGA, IGM
IGM (IMMUNOGLOBIN M), SRM: 16 mg/dL — AB (ref 26–217)
IgA, Qn, Serum: 164 mg/dL (ref 87–352)
IgG, Qn, Serum: 506 mg/dL — ABNORMAL LOW (ref 700–1600)

## 2015-10-21 ENCOUNTER — Encounter: Payer: Self-pay | Admitting: Oncology

## 2015-12-13 ENCOUNTER — Other Ambulatory Visit: Payer: Self-pay

## 2015-12-13 DIAGNOSIS — C9110 Chronic lymphocytic leukemia of B-cell type not having achieved remission: Secondary | ICD-10-CM

## 2015-12-13 MED ORDER — IBRUTINIB 140 MG PO CAPS: 420.0000 mg | ORAL_CAPSULE | Freq: Every day | ORAL | Status: DC

## 2016-01-13 ENCOUNTER — Other Ambulatory Visit: Payer: Self-pay | Admitting: *Deleted

## 2016-01-13 DIAGNOSIS — C9110 Chronic lymphocytic leukemia of B-cell type not having achieved remission: Secondary | ICD-10-CM

## 2016-01-13 MED ORDER — IBRUTINIB 140 MG PO CAPS: 420.0000 mg | ORAL_CAPSULE | Freq: Every day | ORAL | Status: DC

## 2016-01-27 ENCOUNTER — Encounter: Payer: Self-pay | Admitting: Internal Medicine

## 2016-01-27 NOTE — Progress Notes (Signed)
Faxes sent 10/11/15    10/17/15 I sent to medical records

## 2016-01-30 ENCOUNTER — Telehealth: Payer: Self-pay | Admitting: Oncology

## 2016-01-30 ENCOUNTER — Telehealth: Payer: Self-pay | Admitting: *Deleted

## 2016-01-30 NOTE — Telephone Encounter (Signed)
TC from patient requesting a change in her appt time for 02/09/16.  Currently scheduled for 1:30 pm. Needs either earlier in the day or another day. POF sent to scheduler.

## 2016-01-30 NOTE — Telephone Encounter (Signed)
spok w/ pt.. pt only wanted to see dr Jana Hakim.. earliest apt available was 6/26

## 2016-02-02 ENCOUNTER — Other Ambulatory Visit (HOSPITAL_BASED_OUTPATIENT_CLINIC_OR_DEPARTMENT_OTHER): Payer: BC Managed Care – PPO

## 2016-02-02 DIAGNOSIS — C8590 Non-Hodgkin lymphoma, unspecified, unspecified site: Secondary | ICD-10-CM

## 2016-02-02 DIAGNOSIS — C9110 Chronic lymphocytic leukemia of B-cell type not having achieved remission: Secondary | ICD-10-CM

## 2016-02-02 LAB — COMPREHENSIVE METABOLIC PANEL
ALT: 13 U/L (ref 0–55)
ANION GAP: 7 meq/L (ref 3–11)
AST: 17 U/L (ref 5–34)
Albumin: 3.8 g/dL (ref 3.5–5.0)
Alkaline Phosphatase: 127 U/L (ref 40–150)
BILIRUBIN TOTAL: 0.55 mg/dL (ref 0.20–1.20)
BUN: 16.7 mg/dL (ref 7.0–26.0)
CALCIUM: 9.3 mg/dL (ref 8.4–10.4)
CHLORIDE: 108 meq/L (ref 98–109)
CO2: 27 mEq/L (ref 22–29)
CREATININE: 0.9 mg/dL (ref 0.6–1.1)
EGFR: 70 mL/min/{1.73_m2} — AB (ref >90)
Glucose: 136 mg/dl (ref 70–140)
Potassium: 4.3 mEq/L (ref 3.5–5.1)
Sodium: 142 mEq/L (ref 136–145)
Total Protein: 6.2 g/dL — ABNORMAL LOW (ref 6.4–8.3)

## 2016-02-02 LAB — CBC WITH DIFFERENTIAL/PLATELET
BASO%: 0.6 % (ref 0.0–2.0)
BASOS ABS: 0 10*3/uL (ref 0.0–0.1)
EOS ABS: 0.2 10*3/uL (ref 0.0–0.5)
EOS%: 3.1 % (ref 0.0–7.0)
HEMATOCRIT: 41.5 % (ref 34.8–46.6)
HGB: 13.6 g/dL (ref 11.6–15.9)
LYMPH#: 2 10*3/uL (ref 0.9–3.3)
LYMPH%: 40.7 % (ref 14.0–49.7)
MCH: 28.4 pg (ref 25.1–34.0)
MCHC: 32.8 g/dL (ref 31.5–36.0)
MCV: 86.6 fL (ref 79.5–101.0)
MONO#: 0.4 10*3/uL (ref 0.1–0.9)
MONO%: 8 % (ref 0.0–14.0)
NEUT#: 2.3 10*3/uL (ref 1.5–6.5)
NEUT%: 47.6 % (ref 38.4–76.8)
PLATELETS: 118 10*3/uL — AB (ref 145–400)
RBC: 4.79 10*6/uL (ref 3.70–5.45)
RDW: 14.7 % — ABNORMAL HIGH (ref 11.2–14.5)
WBC: 4.9 10*3/uL (ref 3.9–10.3)

## 2016-02-03 LAB — IGG, IGA, IGM
IGA/IMMUNOGLOBULIN A, SERUM: 180 mg/dL (ref 87–352)
IGM (IMMUNOGLOBIN M), SRM: 16 mg/dL — AB (ref 26–217)
IgG, Qn, Serum: 470 mg/dL — ABNORMAL LOW (ref 700–1600)

## 2016-02-09 ENCOUNTER — Other Ambulatory Visit: Payer: BC Managed Care – PPO

## 2016-02-09 ENCOUNTER — Ambulatory Visit: Payer: BC Managed Care – PPO | Admitting: Oncology

## 2016-02-10 ENCOUNTER — Other Ambulatory Visit: Payer: Self-pay | Admitting: *Deleted

## 2016-02-10 DIAGNOSIS — C9110 Chronic lymphocytic leukemia of B-cell type not having achieved remission: Secondary | ICD-10-CM

## 2016-02-10 MED ORDER — IBRUTINIB 140 MG PO CAPS: 420.0000 mg | ORAL_CAPSULE | Freq: Every day | ORAL | Status: DC

## 2016-03-06 ENCOUNTER — Telehealth: Payer: Self-pay | Admitting: Oncology

## 2016-03-06 ENCOUNTER — Ambulatory Visit (HOSPITAL_BASED_OUTPATIENT_CLINIC_OR_DEPARTMENT_OTHER): Payer: BC Managed Care – PPO | Admitting: Oncology

## 2016-03-06 VITALS — BP 167/54 | HR 60 | Temp 98.1°F | Resp 17 | Ht 66.0 in | Wt 197.0 lb

## 2016-03-06 DIAGNOSIS — C9110 Chronic lymphocytic leukemia of B-cell type not having achieved remission: Secondary | ICD-10-CM

## 2016-03-06 DIAGNOSIS — M25561 Pain in right knee: Secondary | ICD-10-CM

## 2016-03-06 NOTE — Telephone Encounter (Signed)
appt made and avs printed °

## 2016-03-06 NOTE — Progress Notes (Signed)
ID: [name]   DOB: [birth year]  MR#: [id number]  [id number]  PCP: Marton Redwood, MD GYN:  SU:  OTHER MD:  CHIEF COMPLAINT: Small lymphocytic lymphoma  CURRENT TREATMENT: Ibrutinib   HISTORY OF PRESENT ILLNESS: From the original intake note:  Maria [name] developed what she thought was "the flu" in December of 2002. She noted a large lymph node developing in her right anterior cervical area. 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   Her subsequent history is as detailed below   INTERVAL HISTORY: Maria [name] returns today for followup of her chronic lymphoid leukemia/ small lymphocytic lymphoma.. She continues on ibrutinib, at standard doses, and she tolerates it well. She continues to work full-time. She has had no intercurrent fevers, rash, or diarrhea, and she tells me that there have been several people at work who have had upper respiratory illness this and she also just visited with her nephews and nieces who also are frequently sick, and she has not gotten any of those infections.  REVIEW OF SYSTEMS: Maria [name]'s worst problem is her right knee. She has difficulty walking, although she does manage to get through her work today. It is worse when she goes from a sitting to standing position. She is very resistant to using a cane, though she understands this will take the pressure off the foot. Occasionally she has ankle swelling. She has mild urinary incontinence, some easy bruising, and then she has other areas where she hurts. She has had no peripheral adenopathy, no drenching sweats, no fevers, no rash, and no intercurrent infections. A detailed review of systems today was otherwise stable  PAST MEDICAL HISTORY: Past Medical History  Diagnosis Date  . Allergy   . Asthma   . Cancer     chronic lymphacytic leukemia  . Diabetes mellitus   . Hyperlipidemia   . Hypertension   . Thyroid disease    hypothyroidism  . Arthritis     PAST SURGICAL HISTORY: Past Surgical History  Procedure Laterality Date  . Abdominal hysterectomy    . Strabismus surgery    . Tonsillectomy    . Lymph node dissection      FAMILY HISTORY Family History  Problem Relation Age of Onset  . Colon cancer Neg Hx   . Pancreatic cancer Neg Hx   . Rectal cancer Neg Hx   . Stomach cancer Neg Hx   . Liver cancer Mother   . Heart disease Father   . Breast cancer Sister    The patient's father died at the age of 67 from a myocardial infarction. The patient's mother died at the age of 52 from primary liver carcinoma. The patient had no brothers. Her one sister, [name], has a history of breast cancer  GYNECOLOGIC HISTORY: Menarche at around age 89. The patient is GX P0. She underwent simple hysterectomy without salpingo-oophorectomy in [place]: RN at [employer], working in the [place], 8h/d x 5d/week. She lives by herself with her cats [place] and [place]. She attends a local [place]: Not in place  HEALTH MAINTENANCE: Social History  Substance Use Topics  . Smoking status: Never Smoker   . Smokeless tobacco: Never Used  . Alcohol Use: Yes     Comment: occasional alcohol intake; once or twice monthly     Colonoscopy:  PAP:  Bone density:  Lipid panel:  Allergies  Allergen Reactions  . Cephalexin  REACTION: Rash  . Lactose Intolerance (Gi)     Diarrhea, gas bloating  . Iohexol Swelling and Rash    Current Outpatient Prescriptions  Medication Sig Dispense Refill  . celecoxib (CELEBREX) 100 MG capsule Take 200 mg by mouth 1 day or 1 dose.    Marland Kitchen Fluticasone-Salmeterol (ADVAIR DISKUS) 100-50 MCG/DOSE AEPB Inhale 1 puff into the lungs every 12 (twelve) hours as needed.     . ibrutinib (IMBRUVICA) 140 MG capsul Take 3 capsules (420 mg total) by mouth daily. 90 capsule 0  . LORazepam (ATIVAN) 1 MG tablet Take 1 mg by mouth as needed.     Marland Kitchen SYNTHROID 88 MCG  tablet Take 88 mcg by mouth daily.      No current facility-administered medications for this visit.    OBJECTIVE: Middle-aged white woman  Filed Vitals:   03/06/16 1231  BP: 167/54  [name]: 60  Temp: 98.1 F (36.7 C)  Resp: 17     Body mass index is 31.81 kg/(m^2).    ECOG FS: 1  Sclerae unicteric, pupils round and equal Oropharynx clear and moist-- no thrush or other lesions No cervical or supraclavicular adenopathy, no axillary or inguinal adenopathy Lungs no rales or rhonchi Heart regular rate and rhythm Abd soft, nontender, positive bowel sounds, no palpable splenomegaly MSK no focal spinal tenderness, no upper extremity lymphedema Neuro: nonfocal, well oriented, appropriate affect Breasts: Deferred     LAB RESULTS: Results for Maria, [name] (MRN [id number]) as of 03/06/2016 13:24  Ref. Range 04/07/2015 10:06 06/02/2015 10:40 07/28/2015 10:31 10/18/2015 09:39 02/02/2016 11:41  lymph# Latest Ref Range: 0.9-3.3 10e3/uL 13.2 (H) 8.3 (H) 5.3 (H) 2.6 2.0  Results for Maria, [name] (MRN [id number]) as of 03/06/2016 13:24  Ref. Range 06/02/2015 10:40 07/28/2015 10:31 09/22/2015 10:56 10/18/2015 09:39 02/02/2016 11:41  IgG (Immunoglobin G), Serum Latest Ref Range: [phone] mg/dL 475 (L) 476 (L) 484 (L) 506 (L) 470 (L)     Lab Results  Component Value Date   WBC 4.9 02/02/2016   NEUTROABS 2.3 02/02/2016   HGB 13.6 02/02/2016   HCT 41.5 02/02/2016   MCV 86.6 02/02/2016   PLT 118* 02/02/2016      Chemistry      Component Value Date/Time   NA 142 02/02/2016 1141   NA 138 07/24/2010 1356   K 4.3 02/02/2016 1141   K 4.1 07/24/2010 1356   CL 105 07/24/2010 1356   CO2 27 02/02/2016 1141   CO2 26 07/24/2010 1356   BUN 16.7 02/02/2016 1141   BUN 13 07/24/2010 1356   CREATININE 0.9 02/02/2016 1141   CREATININE 0.75 07/24/2010 1356   GLU 101* 10/18/2009 1542      Component Value Date/Time   CALCIUM 9.3 02/02/2016 1141   CALCIUM 9.2 07/24/2010 1356   ALKPHOS 127  02/02/2016 1141   ALKPHOS 81 07/24/2010 1356   AST 17 02/02/2016 1141   AST 24 07/24/2010 1356   ALT 13 02/02/2016 1141   ALT 26 07/24/2010 1356   BILITOT 0.55 02/02/2016 1141   BILITOT 0.6 07/24/2010 1356       No results found for: LABCA2  No components found for: LABCA125  No results for input(s): INR in the last 168 hours.  Urinalysis    Component Value Date/Time   COLORURINE LT. YELLOW 01/24/2010 1017   APPEARANCEUR CLEAR 01/24/2010 1017   LABSPEC <=1.005 01/24/2010 1017   PHURINE 5.5 01/24/2010 1017   GLUCOSEU NEGATIVE 01/24/2010 [place] 01/24/2010 1017  KETONESUR NEGATIVE 01/24/2010 1017   UROBILINOGEN 0.2 01/24/2010 1017   NITRITE NEGATIVE 01/24/2010 [place] 01/24/2010 1017    STUDIES: No results found. 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 There was no CD10 or cyclin D1 positivity identified  (4) started ibrutinib at 420 mg/ day 08/09/2014  PLAN:  Maria [name] is tolerating the imbruvica remarkably well. Her absolute lymphocyte count continues to drop and she currently has no peripheral adenopathy. This is very favorable.  The big problem she has of course is her right knee. She is working with Dr. Brigitte [name] on that and he she tells me he is going to refer her for consideration of surgery with [facility]. Once she has a surgical date, if she decides to have surgery, she will let me know and I will give her some IVIG perhaps 2 or 3 days before to boost her immune system, since her IgG levels are  borderline.  Otherwise we are going to continue to check her labs now every 6 weeks, and to see me again in 12 weeks. We will consider repeating a PET scan in December.  Maria [name] knows to call for any problems that may develop before her next visit here. Maria [name] C    03/06/2016

## 2016-03-26 ENCOUNTER — Encounter: Payer: Self-pay | Admitting: Oncology

## 2016-03-26 NOTE — Progress Notes (Signed)
sw with jasmine for prior auth for imbruvica. She will mark as "urgent". She had to send for review. I will let biologics - Angela  [phone]

## 2016-04-17 ENCOUNTER — Other Ambulatory Visit (HOSPITAL_BASED_OUTPATIENT_CLINIC_OR_DEPARTMENT_OTHER): Payer: BC Managed Care – PPO

## 2016-04-17 DIAGNOSIS — C9110 Chronic lymphocytic leukemia of B-cell type not having achieved remission: Secondary | ICD-10-CM | POA: Diagnosis not present

## 2016-04-17 DIAGNOSIS — C8590 Non-Hodgkin lymphoma, unspecified, unspecified site: Secondary | ICD-10-CM

## 2016-04-17 LAB — COMPREHENSIVE METABOLIC PANEL
ALT: 21 U/L (ref 0–55)
ANION GAP: 7 meq/L (ref 3–11)
AST: 21 U/L (ref 5–34)
Albumin: 3.7 g/dL (ref 3.5–5.0)
Alkaline Phosphatase: 116 U/L (ref 40–150)
BILIRUBIN TOTAL: 0.74 mg/dL (ref 0.20–1.20)
BUN: 16.4 mg/dL (ref 7.0–26.0)
CALCIUM: 9.4 mg/dL (ref 8.4–10.4)
CHLORIDE: 107 meq/L (ref 98–109)
CO2: 28 mEq/L (ref 22–29)
CREATININE: 0.9 mg/dL (ref 0.6–1.1)
EGFR: 70 mL/min/{1.73_m2} — AB (ref >90)
Glucose: 134 mg/dl (ref 70–140)
Potassium: 4.3 mEq/L (ref 3.5–5.1)
Sodium: 142 mEq/L (ref 136–145)
TOTAL PROTEIN: 6.3 g/dL — AB (ref 6.4–8.3)

## 2016-04-17 LAB — CBC WITH DIFFERENTIAL/PLATELET
BASO%: 1.5 % (ref 0.0–2.0)
Basophils Absolute: 0.1 10*3/uL (ref 0.0–0.1)
EOS ABS: 0.2 10*3/uL (ref 0.0–0.5)
EOS%: 3.9 % (ref 0.0–7.0)
HEMATOCRIT: 42.5 % (ref 34.8–46.6)
HGB: 13.6 g/dL (ref 11.6–15.9)
LYMPH#: 1.8 10*3/uL (ref 0.9–3.3)
LYMPH%: 36.6 % (ref 14.0–49.7)
MCH: 27.7 pg (ref 25.1–34.0)
MCHC: 32.1 g/dL (ref 31.5–36.0)
MCV: 86.2 fL (ref 79.5–101.0)
MONO#: 0.4 10*3/uL (ref 0.1–0.9)
MONO%: 8 % (ref 0.0–14.0)
NEUT#: 2.5 10*3/uL (ref 1.5–6.5)
NEUT%: 50 % (ref 38.4–76.8)
PLATELETS: 127 10*3/uL — AB (ref 145–400)
RBC: 4.93 10*6/uL (ref 3.70–5.45)
RDW: 15 % — ABNORMAL HIGH (ref 11.2–14.5)
WBC: 5 10*3/uL (ref 3.9–10.3)

## 2016-04-23 ENCOUNTER — Other Ambulatory Visit: Payer: Self-pay | Admitting: *Deleted

## 2016-04-23 DIAGNOSIS — C9110 Chronic lymphocytic leukemia of B-cell type not having achieved remission: Secondary | ICD-10-CM

## 2016-04-23 MED ORDER — IBRUTINIB 140 MG PO CAPS: 420.0000 mg | ORAL_CAPSULE | Freq: Every day | ORAL | 2 refills | Status: DC

## 2016-05-29 ENCOUNTER — Other Ambulatory Visit (HOSPITAL_BASED_OUTPATIENT_CLINIC_OR_DEPARTMENT_OTHER): Payer: BC Managed Care – PPO

## 2016-05-29 ENCOUNTER — Ambulatory Visit (HOSPITAL_BASED_OUTPATIENT_CLINIC_OR_DEPARTMENT_OTHER): Payer: BC Managed Care – PPO | Admitting: Oncology

## 2016-05-29 VITALS — BP 143/65 | HR 62 | Temp 98.0°F | Resp 18 | Ht 66.0 in | Wt 200.4 lb

## 2016-05-29 DIAGNOSIS — C8590 Non-Hodgkin lymphoma, unspecified, unspecified site: Secondary | ICD-10-CM

## 2016-05-29 DIAGNOSIS — M545 Low back pain: Secondary | ICD-10-CM

## 2016-05-29 DIAGNOSIS — C9110 Chronic lymphocytic leukemia of B-cell type not having achieved remission: Secondary | ICD-10-CM

## 2016-05-29 DIAGNOSIS — M25561 Pain in right knee: Secondary | ICD-10-CM | POA: Diagnosis not present

## 2016-05-29 DIAGNOSIS — M2550 Pain in unspecified joint: Secondary | ICD-10-CM

## 2016-05-29 LAB — CBC WITH DIFFERENTIAL/PLATELET
BASO%: 1.4 % (ref 0.0–2.0)
Basophils Absolute: 0.1 10*3/uL (ref 0.0–0.1)
EOS%: 3.5 % (ref 0.0–7.0)
Eosinophils Absolute: 0.2 10*3/uL (ref 0.0–0.5)
HEMATOCRIT: 45 % (ref 34.8–46.6)
HGB: 14.6 g/dL (ref 11.6–15.9)
LYMPH#: 2.3 10*3/uL (ref 0.9–3.3)
LYMPH%: 40.7 % (ref 14.0–49.7)
MCH: 27.8 pg (ref 25.1–34.0)
MCHC: 32.4 g/dL (ref 31.5–36.0)
MCV: 85.7 fL (ref 79.5–101.0)
MONO#: 0.4 10*3/uL (ref 0.1–0.9)
MONO%: 7.6 % (ref 0.0–14.0)
NEUT#: 2.6 10*3/uL (ref 1.5–6.5)
NEUT%: 46.8 % (ref 38.4–76.8)
PLATELETS: 132 10*3/uL — AB (ref 145–400)
RBC: 5.25 10*6/uL (ref 3.70–5.45)
RDW: 14.4 % (ref 11.2–14.5)
WBC: 5.6 10*3/uL (ref 3.9–10.3)

## 2016-05-29 LAB — COMPREHENSIVE METABOLIC PANEL
ALK PHOS: 124 U/L (ref 40–150)
ALT: 18 U/L (ref 0–55)
ANION GAP: 9 meq/L (ref 3–11)
AST: 21 U/L (ref 5–34)
Albumin: 3.7 g/dL (ref 3.5–5.0)
BUN: 19.6 mg/dL (ref 7.0–26.0)
CALCIUM: 9.6 mg/dL (ref 8.4–10.4)
CHLORIDE: 106 meq/L (ref 98–109)
CO2: 26 mEq/L (ref 22–29)
Creatinine: 0.9 mg/dL (ref 0.6–1.1)
EGFR: 72 mL/min/{1.73_m2} — ABNORMAL LOW (ref >90)
Glucose: 143 mg/dl — ABNORMAL HIGH (ref 70–140)
POTASSIUM: 4.4 meq/L (ref 3.5–5.1)
Sodium: 141 mEq/L (ref 136–145)
Total Bilirubin: 0.6 mg/dL (ref 0.20–1.20)
Total Protein: 6.5 g/dL (ref 6.4–8.3)

## 2016-05-29 NOTE — Progress Notes (Signed)
ID: Maria [name]   DOB: [birth year]  MR#: [id number]  [id number]  PCP: Maria Redwood, MD GYN:  SU:  OTHER MD:  CHIEF COMPLAINT: Small lymphocytic lymphoma  CURRENT TREATMENT: Ibrutinib   HISTORY OF PRESENT ILLNESS: From the original intake note:  Maria [name] developed what she thought was "the flu" in December of 2002. She noted a large lymph node developing in her right anterior cervical area. 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   Her subsequent history is as detailed below   INTERVAL HISTORY: Maria [name] returns today for followup of her chronic lymphoid leukemia/ small lymphocytic lymphoma..she is tolerating the ibrutinib with no side effects that she is aware of other than minor bruising. She obtains it at.  REVIEW OF SYSTEMS: Maria [name] continues to have significant pain in her right knee, and she can barely walk on it. This slows her down at work. She is going to be meeting with Dr. Wynelle Link this week and hopefully we will have knee surgery this month or so. Aside from that she got new glasses and there was a significant change in her vision. She now can see better. She continues to have also some back and joint pain. She feels a bit forgetful. There have been no intercurrent fevers, no rash, and no unexplained weight loss or fatigue. A detailed review of systems today was otherwise stable.  PAST MEDICAL HISTORY: Past Medical History:  Diagnosis Date  . Allergy   . Arthritis   . Asthma   . Cancer    chronic lymphacytic leukemia  . Diabetes mellitus   . Hyperlipidemia   . Hypertension   . Thyroid disease    hypothyroidism    PAST SURGICAL HISTORY: Past Surgical History:  Procedure Laterality Date  . ABDOMINAL HYSTERECTOMY    . LYMPH NODE DISSECTION    . STRABISMUS SURGERY    . TONSILLECTOMY      FAMILY HISTORY Family History  Problem Relation Age of Onset  . Colon cancer Neg Hx   .  Pancreatic cancer Neg Hx   . Rectal cancer Neg Hx   . Stomach cancer Neg Hx   . Liver cancer Mother   . Heart disease Father   . Breast cancer Sister    The patient's father died at the age of 30 from a myocardial infarction. The patient's mother died at the age of 25 from primary liver carcinoma. The patient had no brothers. Her one sister, Maria [name], has a history of breast cancer  GYNECOLOGIC HISTORY: Menarche at around age 65. The patient is GX P0. She underwent simple hysterectomy without salpingo-oophorectomy in 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 She lives by herself with her cats [place] and [place]. She attends a local [place]: Not in place  HEALTH MAINTENANCE: Social History  Substance Use Topics  . Smoking status: Never Smoker  . Smokeless tobacco: Never Used  . Alcohol use Yes     Comment: occasional alcohol intake; once or twice monthly     Colonoscopy:  PAP:  Bone density:  Lipid panel:  Allergies  Allergen Reactions  . Cephalexin     REACTION: Rash  . Lactose Intolerance (Gi)     Diarrhea, gas bloating  . Iohexol Swelling and Rash    Current Outpatient Prescriptions  Medication Sig Dispense Refill  . celecoxib (CELEBREX) 100 MG capsule Take 200  mg by mouth 1 day or 1 dose.    . ibrutinib (IMBRUVICA) 140 MG capsul Take 3 capsules (420 mg total) by mouth daily. 90 capsule 2  . LORazepam (ATIVAN) 1 MG tablet Take 1 mg by mouth as needed.     Marland Kitchen SYNTHROID 88 MCG tablet Take 88 mcg by mouth daily.      No current facility-administered medications for this visit.     OBJECTIVE: Middle-aged white woman walks with a slight limp Vitals:   05/29/16 0901  BP: (!) 143/65  [name]: 62  Resp: 18  Temp: 98 F (36.7 C)     Body mass index is 32.35 kg/m.    ECOG FS: 1  Sclerae unicteric, EOMs intact Oropharynx clear and moist No cervical or supraclavicular adenopathy, no axillary or inguinal  adenopathy Lungs no rales or rhonchi Heart regular rate and rhythm Abd soft, nontender, positive bowel sounds MSK no focal spinal tenderness, no upper extremity lymphedema, significant crepitus right knee Neuro: nonfocal, well oriented, appropriate affect Breasts: Deferred      LAB RESULTS:  Lab Results  Component Value Date   WBC 5.6 05/29/2016   NEUTROABS 2.6 05/29/2016   HGB 14.6 05/29/2016   HCT 45.0 05/29/2016   MCV 85.7 05/29/2016   PLT 132 (L) 05/29/2016      Chemistry      Component Value Date/Time   NA 142 04/17/2016 1106   K 4.3 04/17/2016 1106   CL 105 07/24/2010 1356   CO2 28 04/17/2016 1106   BUN 16.4 04/17/2016 1106   CREATININE 0.9 04/17/2016 1106   GLU 101 (H) 10/18/2009 1542      Component Value Date/Time   CALCIUM 9.4 04/17/2016 1106   ALKPHOS 116 04/17/2016 1106   AST 21 04/17/2016 1106   ALT 21 04/17/2016 1106   BILITOT 0.74 04/17/2016 1106       No results found for: LABCA2  No components found for: LABCA125  No results for input(s): INR in the last 168 hours.  Urinalysis    Component Value Date/Time   COLORURINE LT. YELLOW 01/24/2010 1017   APPEARANCEUR CLEAR 01/24/2010 1017   LABSPEC <=1.005 01/24/2010 1017   PHURINE 5.5 01/24/2010 1017   GLUCOSEU NEGATIVE 01/24/2010 [place] 01/24/2010 1017   KETONESUR NEGATIVE 01/24/2010 1017   UROBILINOGEN 0.2 01/24/2010 1017   NITRITE NEGATIVE 01/24/2010 [place] 01/24/2010 1017    STUDIES: No results found. 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 There was no CD10 or cyclin D1 positivity identified  (4) started ibrutinib at 420 mg/ day 08/09/2014  PLAN:  [name] is approaching a fall clinical remission on ibrutinib. Her platelets are trending now towards normal. The only reason she does not have 100% functional status is nothing to do with her chronic lymphoid leukemia. It is the right knee problem.  I anticipate she will have surgery sometime this month. Because ibrutinib can cause bleeding problems I would stop it 1 week before the surgery and started 48 hours after. We discussed this at length and she is aware of how to do this.  Otherwise  I'm continuing the ibrutinib without any changes. She should have a restaging PET scan later this year. Orders for that have been entered. Were going to continue to check labs every 6 weeks. She will see me again late January, after her PET scan and also after her January mammography  She knows to call for any problems that may develop before the next visit here. [name],GUSTAV C    05/29/2016

## 2016-07-10 ENCOUNTER — Other Ambulatory Visit (HOSPITAL_BASED_OUTPATIENT_CLINIC_OR_DEPARTMENT_OTHER): Payer: BC Managed Care – PPO

## 2016-07-10 DIAGNOSIS — C9110 Chronic lymphocytic leukemia of B-cell type not having achieved remission: Secondary | ICD-10-CM | POA: Diagnosis not present

## 2016-07-10 DIAGNOSIS — C8590 Non-Hodgkin lymphoma, unspecified, unspecified site: Secondary | ICD-10-CM

## 2016-07-10 LAB — CBC WITH DIFFERENTIAL/PLATELET
BASO%: 1.1 % (ref 0.0–2.0)
BASOS ABS: 0.1 10*3/uL (ref 0.0–0.1)
EOS%: 3.1 % (ref 0.0–7.0)
Eosinophils Absolute: 0.1 10*3/uL (ref 0.0–0.5)
HCT: 45.5 % (ref 34.8–46.6)
HGB: 14.5 g/dL (ref 11.6–15.9)
LYMPH%: 44.8 % (ref 14.0–49.7)
MCH: 27.4 pg (ref 25.1–34.0)
MCHC: 31.8 g/dL (ref 31.5–36.0)
MCV: 86.2 fL (ref 79.5–101.0)
MONO#: 0.3 10*3/uL (ref 0.1–0.9)
MONO%: 7.5 % (ref 0.0–14.0)
NEUT#: 2 10*3/uL (ref 1.5–6.5)
NEUT%: 43.5 % (ref 38.4–76.8)
Platelets: 130 10*3/uL — ABNORMAL LOW (ref 145–400)
RBC: 5.28 10*6/uL (ref 3.70–5.45)
RDW: 14.7 % — AB (ref 11.2–14.5)
WBC: 4.5 10*3/uL (ref 3.9–10.3)
lymph#: 2 10*3/uL (ref 0.9–3.3)

## 2016-07-10 LAB — COMPREHENSIVE METABOLIC PANEL
ALT: 21 U/L (ref 0–55)
AST: 23 U/L (ref 5–34)
Albumin: 3.7 g/dL (ref 3.5–5.0)
Alkaline Phosphatase: 113 U/L (ref 40–150)
Anion Gap: 7 mEq/L (ref 3–11)
BUN: 15.7 mg/dL (ref 7.0–26.0)
CHLORIDE: 107 meq/L (ref 98–109)
CO2: 28 mEq/L (ref 22–29)
Calcium: 9.7 mg/dL (ref 8.4–10.4)
Creatinine: 1 mg/dL (ref 0.6–1.1)
EGFR: 63 mL/min/{1.73_m2} — ABNORMAL LOW (ref >90)
GLUCOSE: 136 mg/dL (ref 70–140)
POTASSIUM: 4.6 meq/L (ref 3.5–5.1)
SODIUM: 142 meq/L (ref 136–145)
Total Bilirubin: 0.92 mg/dL (ref 0.20–1.20)
Total Protein: 6.4 g/dL (ref 6.4–8.3)

## 2016-07-10 LAB — LACTATE DEHYDROGENASE: LDH: 160 U/L (ref 125–245)

## 2016-07-11 LAB — IGG, IGA, IGM
IgA, Qn, Serum: 181 mg/dL (ref 87–352)
IgG, Qn, Serum: 461 mg/dL — ABNORMAL LOW (ref 700–1600)
IgM, Qn, Serum: 15 mg/dL — ABNORMAL LOW (ref 26–217)

## 2016-07-19 ENCOUNTER — Other Ambulatory Visit: Payer: Self-pay | Admitting: *Deleted

## 2016-08-17 ENCOUNTER — Other Ambulatory Visit: Payer: Self-pay | Admitting: Oncology

## 2016-08-17 NOTE — Progress Notes (Unsigned)
ID: Maria [name]   DOB: [birth year]  MR#: [id number]  [id number]  PCP: Maria Redwood, MD GYN:  SU:  OTHER MD:  CHIEF COMPLAINT: Small lymphocytic lymphoma  CURRENT TREATMENT: Ibrutinib   HISTORY OF PRESENT ILLNESS: From the original intake note:  Maria [name] developed what she thought was "the flu" in December of 2002. She noted a large lymph node developing in her right anterior cervical area. 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   Her subsequent history is as detailed below   INTERVAL HISTORY: Maria [name] returns today for followup of her chronic lymphoid leukemia/ small lymphocytic lymphoma..she is tolerating the ibrutinib with no side effects that she is aware of other than minor bruising. She obtains it at.  REVIEW OF SYSTEMS: Maria [name] continues to have significant pain in her right knee, and she can barely walk on it. This slows her down at work. She is going to be meeting with Dr. Wynelle [name] this week and hopefully we will have knee surgery this month or so. Aside from that she got new glasses and there was a significant change in her vision. She now can see better. She continues to have also some back and joint pain. She feels a bit forgetful. There have been no intercurrent fevers, no rash, and no unexplained weight loss or fatigue. A detailed review of systems today was otherwise stable.  PAST MEDICAL HISTORY: Past Medical History:  Diagnosis Date  . Allergy   . Arthritis   . Asthma   . Cancer    chronic lymphacytic leukemia  . Diabetes mellitus   . Hyperlipidemia   . Hypertension   . Thyroid disease    hypothyroidism    PAST SURGICAL HISTORY: Past Surgical History:  Procedure Laterality Date  . ABDOMINAL HYSTERECTOMY    . LYMPH NODE DISSECTION    . STRABISMUS SURGERY    . TONSILLECTOMY      FAMILY HISTORY Family History  Problem Relation Age of Onset  . Colon cancer Neg Hx   .  Pancreatic cancer Neg Hx   . Rectal cancer Neg Hx   . Stomach cancer Neg Hx   . Liver cancer Mother   . Heart disease Father   . Breast cancer Sister    The patient's father died at the age of 94 from a myocardial infarction. The patient's mother died at the age of 31 from primary liver carcinoma. The patient had no brothers. Her one sister, Maria [name], has a history of breast cancer  GYNECOLOGIC HISTORY: Menarche at around age 37. The patient is GX P0. 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 She lives by herself with her cats Maria [name] and Maria [name]. She attends a local Maria [name]: Not in place  HEALTH MAINTENANCE: Social History  Substance Use Topics  . Smoking status: Never Smoker  . Smokeless tobacco: Never Used  . Alcohol use Yes     Comment: occasional alcohol intake; once or twice monthly     Colonoscopy:  PAP:  Bone density:  Lipid panel:  Allergies  Allergen Reactions  . Cephalexin     REACTION: Rash  . Lactose Intolerance (Gi)     Diarrhea, gas bloating  . Iohexol Swelling and Rash    Current Outpatient Prescriptions  Medication Sig Dispense Refill  . celecoxib (CELEBREX) 100 MG capsule Take 200  mg by mouth 1 day or 1 dose.    . ibrutinib (IMBRUVICA) 140 MG capsul Take 3 capsules (420 mg total) by mouth daily. 90 capsule 2  . LORazepam (ATIVAN) 1 MG tablet Take 1 mg by mouth as needed.     Marland Kitchen SYNTHROID 88 MCG tablet Take 88 mcg by mouth daily.      No current facility-administered medications for this visit.     OBJECTIVE: Middle-aged white woman walks with a slight limp There were no vitals filed for this visit.   There is no height or weight on file to calculate BMI.    ECOG FS: 1  Sclerae unicteric, EOMs intact Oropharynx clear and moist No cervical or supraclavicular adenopathy, no axillary or inguinal adenopathy Lungs no rales  or rhonchi Heart regular rate and rhythm Abd soft, nontender, positive bowel sounds MSK no focal spinal tenderness, no upper extremity lymphedema, significant crepitus right knee Neuro: nonfocal, well oriented, appropriate affect Breasts: Deferred      LAB RESULTS:  Lab Results  Component Value Date   WBC 4.5 07/10/2016   NEUTROABS 2.0 07/10/2016   HGB 14.5 07/10/2016   HCT 45.5 07/10/2016   MCV 86.2 07/10/2016   PLT 130 (L) 07/10/2016      Chemistry      Component Value Date/Time   NA 142 07/10/2016 1032   K 4.6 07/10/2016 1032   CL 105 07/24/2010 1356   CO2 28 07/10/2016 1032   BUN 15.7 07/10/2016 1032   CREATININE 1.0 07/10/2016 1032   GLU 101 (H) 10/18/2009 1542      Component Value Date/Time   CALCIUM 9.7 07/10/2016 1032   ALKPHOS 113 07/10/2016 1032   AST 23 07/10/2016 1032   ALT 21 07/10/2016 1032   BILITOT 0.92 07/10/2016 1032       No results found for: LABCA2  No components found for: LABCA125  No results for input(s): INR in the last 168 hours.  Urinalysis    Component Value Date/Time   COLORURINE LT. YELLOW 01/24/2010 1017   APPEARANCEUR CLEAR 01/24/2010 1017   LABSPEC <=1.005 01/24/2010 1017   PHURINE 5.5 01/24/2010 1017   GLUCOSEU NEGATIVE 01/24/2010 [place] 01/24/2010 1017   KETONESUR NEGATIVE 01/24/2010 1017   UROBILINOGEN 0.2 01/24/2010 1017   NITRITE NEGATIVE 01/24/2010 [place] 01/24/2010 1017    STUDIES: No results found. 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 There was no CD10 or cyclin D1 positivity identified  (4) started ibrutinib at 420 mg/ day 08/09/2014  PLAN:  Maria [name] is approaching a fall clinical remission on ibrutinib. Her platelets are trending now towards normal. The only reason she does not have 100% functional status is nothing to do with her chronic lymphoid leukemia. It is the right knee problem.  I anticipate she will have surgery sometime this month. Because ibrutinib can cause bleeding problems I would stop it 1 week before the surgery and started 48 hours after. We discussed this at length and she is aware of how to do this.  Otherwise I'm continuing the ibrutinib without any changes. She should have  a restaging PET scan later this year. Orders for that have been entered. Were going to continue to check labs every 6 weeks. She will see me again late January, after her PET scan and also after her January mammography  She knows to call for any problems that may develop before the next visit here. Arvie Bartholomew C    08/17/2016

## 2016-08-21 ENCOUNTER — Other Ambulatory Visit (HOSPITAL_BASED_OUTPATIENT_CLINIC_OR_DEPARTMENT_OTHER): Payer: BC Managed Care – PPO

## 2016-08-21 ENCOUNTER — Ambulatory Visit (HOSPITAL_COMMUNITY)
Admission: RE | Admit: 2016-08-21 | Discharge: 2016-08-21 | Disposition: A | Discharge status: Home / Self Care | Payer: BC Managed Care – PPO | Source: Ambulatory Visit | Attending: Oncology | Admitting: Oncology

## 2016-08-21 DIAGNOSIS — C8590 Non-Hodgkin lymphoma, unspecified, unspecified site: Secondary | ICD-10-CM

## 2016-08-21 DIAGNOSIS — C9110 Chronic lymphocytic leukemia of B-cell type not having achieved remission: Secondary | ICD-10-CM | POA: Insufficient documentation

## 2016-08-21 LAB — COMPREHENSIVE METABOLIC PANEL
ALT: 16 U/L (ref 0–55)
ANION GAP: 9 meq/L (ref 3–11)
AST: 19 U/L (ref 5–34)
Albumin: 3.8 g/dL (ref 3.5–5.0)
Alkaline Phosphatase: 107 U/L (ref 40–150)
BILIRUBIN TOTAL: 0.82 mg/dL (ref 0.20–1.20)
BUN: 15.8 mg/dL (ref 7.0–26.0)
CHLORIDE: 107 meq/L (ref 98–109)
CO2: 26 meq/L (ref 22–29)
CREATININE: 0.9 mg/dL (ref 0.6–1.1)
Calcium: 9.4 mg/dL (ref 8.4–10.4)
EGFR: 65 mL/min/{1.73_m2} — ABNORMAL LOW (ref >90)
GLUCOSE: 118 mg/dL (ref 70–140)
Potassium: 4.4 mEq/L (ref 3.5–5.1)
SODIUM: 142 meq/L (ref 136–145)
TOTAL PROTEIN: 6.5 g/dL (ref 6.4–8.3)

## 2016-08-21 LAB — CBC WITH DIFFERENTIAL/PLATELET
BASO%: 1.3 % (ref 0.0–2.0)
Basophils Absolute: 0.1 10*3/uL (ref 0.0–0.1)
EOS%: 3.9 % (ref 0.0–7.0)
Eosinophils Absolute: 0.2 10*3/uL (ref 0.0–0.5)
HCT: 45.4 % (ref 34.8–46.6)
HGB: 14.5 g/dL (ref 11.6–15.9)
LYMPH%: 41.4 % (ref 14.0–49.7)
MCH: 27.4 pg (ref 25.1–34.0)
MCHC: 31.9 g/dL (ref 31.5–36.0)
MCV: 86 fL (ref 79.5–101.0)
MONO#: 0.4 10*3/uL (ref 0.1–0.9)
MONO%: 7.9 % (ref 0.0–14.0)
NEUT%: 45.5 % (ref 38.4–76.8)
NEUTROS ABS: 2.3 10*3/uL (ref 1.5–6.5)
Platelets: 133 10*3/uL — ABNORMAL LOW (ref 145–400)
RBC: 5.28 10*6/uL (ref 3.70–5.45)
RDW: 14.7 % — ABNORMAL HIGH (ref 11.2–14.5)
WBC: 5.1 10*3/uL (ref 3.9–10.3)
lymph#: 2.1 10*3/uL (ref 0.9–3.3)

## 2016-08-21 LAB — LACTATE DEHYDROGENASE: LDH: 181 U/L (ref 125–245)

## 2016-08-21 LAB — GLUCOSE, CAPILLARY: GLUCOSE-CAPILLARY: 108 mg/dL — AB (ref 65–99)

## 2016-08-21 MED ORDER — FLUDEOXYGLUCOSE F - 18 (FDG) INJECTION
9.7000 | Freq: Once | INTRAVENOUS | Status: AC | PRN
  Administered 2016-08-21: 9.7 via INTRAVENOUS

## 2016-08-22 ENCOUNTER — Encounter: Payer: Self-pay | Admitting: Oncology

## 2016-08-22 LAB — IGG, IGA, IGM
IGA/IMMUNOGLOBULIN A, SERUM: 176 mg/dL (ref 87–352)
IGM (IMMUNOGLOBIN M), SRM: 16 mg/dL — AB (ref 26–217)
IgG, Qn, Serum: 451 mg/dL — ABNORMAL LOW (ref 700–1600)

## 2016-10-09 ENCOUNTER — Ambulatory Visit (HOSPITAL_BASED_OUTPATIENT_CLINIC_OR_DEPARTMENT_OTHER): Payer: BC Managed Care – PPO | Admitting: Oncology

## 2016-10-09 ENCOUNTER — Other Ambulatory Visit (HOSPITAL_BASED_OUTPATIENT_CLINIC_OR_DEPARTMENT_OTHER): Payer: BC Managed Care – PPO

## 2016-10-09 VITALS — BP 151/53 | HR 65 | Temp 97.5°F | Resp 18 | Ht 66.0 in | Wt 204.1 lb

## 2016-10-09 DIAGNOSIS — C9110 Chronic lymphocytic leukemia of B-cell type not having achieved remission: Secondary | ICD-10-CM

## 2016-10-09 DIAGNOSIS — D696 Thrombocytopenia, unspecified: Secondary | ICD-10-CM | POA: Diagnosis not present

## 2016-10-09 DIAGNOSIS — M25561 Pain in right knee: Secondary | ICD-10-CM | POA: Diagnosis not present

## 2016-10-09 LAB — LACTATE DEHYDROGENASE: LDH: 166 U/L (ref 125–245)

## 2016-10-09 NOTE — Progress Notes (Signed)
ID: Maria [name]   DOB: 03-Jul-1951  MR#: [id number]  [id number]  Patient Care Team: Marton Redwood, MD as PCP - General (Internal Medicine) [name], MD as Consulting Physician (Orthopedic Surgery) Chauncey Cruel, MD as Consulting Physician (Oncology)   CHIEF COMPLAINT: Small lymphocytic lymphoma  CURRENT TREATMENT: Ibrutinib   HISTORY OF PRESENT ILLNESS: From the original intake note:  Maria [name] developed what she thought was "the flu" in December of 2002. She noted a large lymph node developing in her right anterior cervical area. 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   Her subsequent history is as detailed below   INTERVAL HISTORY: Maria [name] small cell lymphocytic lymphoma. She continues on ibrutinib  3 tablets daily (420 mg). She tolerates that well. She has mild easy bruising as her chief side effects. She obtains the drug at $10 per month (she tells me  Maria [name] saw what her insurance is actually being billed she did not realize what an extensive wo).  She recently saw Dr. Brigitte [name] who obtained extensive lab work including a CBC with differential which was not repeated today. She had a total white cell count of 5.27, with an absolute lymphocyte count of 2.0. Granulocytes were 2.7 and a platelet count was 125,000. Hemoglobin was 15.1 with an MCV of 93.1  In addition she had what turned out to be a migraine which took her to the emergency room  09/12/2016. She had a brain MRI and MRA of the neck showing no evidence of stenosis or aneurysm. The sulci where a bit more prominent than expected for age. Basically there was no acute intracranial abnormality. Chest x-ray obtained the same day showed no acute  Airspace disease.   REVIEW OF SYSTEMS: Maria [name]  Is finally scheduled for right knee surgery 03/26 2018.  Despite the knee pain she is working full-time. When she is not doing is exercising regularly. She  complains of dry skin and some back and joint pain and some forgetfulness. She finds her work stressful.  A detailed review of systems today was otherwise stable and particularly she denies unexplained weight loss unexplained fatigue fevers or drenching sweats.  PAST MEDICAL HISTORY: Past Medical History:  Diagnosis Date  . Allergy   . Arthritis   . Asthma   . Cancer    chronic lymphacytic leukemia  . Diabetes mellitus   . Hyperlipidemia   . Hypertension   . Thyroid disease    hypothyroidism    PAST SURGICAL HISTORY: Past Surgical History:  Procedure Laterality Date  . ABDOMINAL HYSTERECTOMY    . LYMPH NODE DISSECTION    . STRABISMUS SURGERY    . TONSILLECTOMY      FAMILY HISTORY Family History  Problem Relation Age of Onset  . Colon cancer Neg Hx   . Pancreatic cancer Neg Hx   . Rectal cancer Neg Hx   . Stomach cancer Neg Hx   . Liver cancer Mother   . Heart disease Father   . Breast cancer Sister    The patient's father died at the age of 90 from a myocardial infarction. The patient's mother died at the age of 13 from primary liver carcinoma. The patient had no brothers. Her one sister, Maria [name], has a history of breast cancer  GYNECOLOGIC HISTORY: Menarche at around age 64. The patient is GX P0. She underwent simple hysterectomy without salpingo-oophorectomy in [place]: RN at [employer], working in  the OR, 8h/d x 5d/week. She lives by herself with her cats [place] and [place]. She attends a local [employer]: Not in place  HEALTH MAINTENANCE: Social History  Substance Use Topics  . Smoking status: Never Smoker  . Smokeless tobacco: Never Used  . Alcohol use Yes     Comment: occasional alcohol intake; once or twice monthly     Colonoscopy: 06/01/2014/[facility]  PAP:  Bone density:  Lipid panel:  228/114/50/155/ratio 4.6  On 10/01/2016  Allergies  Allergen Reactions  . Cephalexin     REACTION: Rash  . Lactose  Intolerance (Gi)     Diarrhea, gas bloating  . Iohexol Swelling and Rash    Current Outpatient Prescriptions  Medication Sig Dispense Refill  . Cholecalciferol (VITAMIN D3) 50000 units CAPS Take 1 capsule by mouth.    . benazepril-hydrochlorthiazide (LOTENSIN HCT) 20-12.5 MG tablet Take 1 tablet by mouth daily.    . celecoxib (CELEBREX) 100 MG capsule Take 200 mg by mouth 1 day or 1 dose.    . ibrutinib (IMBRUVICA) 140 MG capsul Take 3 capsules (420 mg total) by mouth daily. 90 capsule 2  . LORazepam (ATIVAN) 1 MG tablet Take 1 mg by mouth as needed.     Marland Kitchen SYNTHROID 88 MCG tablet Take 88 mcg by mouth daily.      No current facility-administered medications for this visit.     OBJECTIVE: Middle-aged white woman in no acute distress Vitals:   10/09/16 1155  BP: (!) 151/53  [name]: 65  Resp: 18  Temp: 97.5 F (36.4 C)     Body mass index is 32.94 kg/m.    ECOG FS: 1  Sclerae unicteric, pupils round and equal Oropharynx clear and moist-- no thrush or other lesions No cervical or supraclavicular adenopathy;  No right axillary adenopathy; movable nontender rubbery left axillary lymph nodes, largest approximately 2 cm; no inguinal adenopathy Lungs no rales or rhonchi Heart regular rate and rhythm Abd soft, nontender, positive bowel sounds MSK no focal spinal tenderness, no upper extremity lymphedema Neuro: nonfocal, well oriented, appropriate affect Breasts: Deferred  LAB RESULTS:  Lab Results  Component Value Date   WBC 5.1 08/21/2016   NEUTROABS 2.3 08/21/2016   HGB 14.5 08/21/2016   HCT 45.4 08/21/2016   MCV 86.0 08/21/2016   PLT 133 (L) 08/21/2016      Chemistry      Component Value Date/Time   NA 142 08/21/2016 0958   K 4.4 08/21/2016 0958   CL 105 07/24/2010 1356   CO2 26 08/21/2016 0958   BUN 15.8 08/21/2016 0958   CREATININE 0.9 08/21/2016 0958   GLU 101 (H) 10/18/2009 1542      Component Value Date/Time   CALCIUM 9.4 08/21/2016 0958   ALKPHOS 107  08/21/2016 0958   AST 19 08/21/2016 0958   ALT 16 08/21/2016 0958   BILITOT 0.82 08/21/2016 0958       No results found for: LABCA2  No components found for: [id number]  No results for input(s): INR in the last 168 hours.  Urinalysis    Component Value Date/Time   COLORURINE LT. YELLOW 01/24/2010 1017   APPEARANCEUR CLEAR 01/24/2010 1017   LABSPEC <=1.005 01/24/2010 1017   PHURINE 5.5 01/24/2010 1017   GLUCOSEU NEGATIVE 01/24/2010 [place] 01/24/2010 1017   KETONESUR NEGATIVE 01/24/2010 1017   UROBILINOGEN 0.2 01/24/2010 1017   NITRITE NEGATIVE 01/24/2010 [place] 01/24/2010 1017    STUDIES: --  No acute intracranial abnormality, moderate atrophy. --Unremarkable MRA of the brain.  Result Narrative  EXAM: Magnetic resonance imaging, brain without and with contrast material, AND Magnetic resonance angiography, head. DATE: 09/12/2016 10:42 PM ACCESSION: [id number] UN DICTATED: 09/12/2016 10:58 PM INTERPRETATION LOCATION: [place]  CLINICAL INDICATION: 66 years old Female with stroke--  COMPARISON: Concurrent MRA of the neck and MRI of the brain 07/15/2004.  TECHNIQUE: Multiplanar, multisequence MR imaging of the brain was performed without and with I.V. contrast.Time of flight MRA of intracranial arterial circulation was also performed.  FINDINGS: There are a few punctate foci of increased T2/FLAIR signal in the deep white matter which are unchanged since 07/15/2004 and likely related to mild small vessel ischemic disease. There is diffuse prominence of the sulci, greater than expected for age. There is no midline shift or mass lesion. There is no evidence of intracranial hemorrhage or acute infarct.There are no extra-axial fluid collections present.No diffusion weighted signal abnormality is identified.  There is no abnormal enhancement following contrast administration.   Intracranial MRA does not demonstrate any aneurysms or  high grade stenoses. 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 There was no CD10 or cyclin D1 positivity identified  (4) started ibrutinib at 420 mg/ day 08/09/2014  PLAN:  Shahad has now been a little over 2 years on ibrutinib, with excellent tolerance. Her counts have normalized except for the mild thrombocytopenia. Her disfiguring adenopathy has largely resolved although she still has some palpable lymph nodes in the left axilla.  At this point as far as her chronic lymphoid leukemia is concerned we are making no treatment changes and the plan is to continue ibrutinib indefinitely on until her is clear evidence of disease progression.  She is scheduled for right knee surgery 12/03/2016. I have asked her to drop the ibrutinib dose to 1 tablet beginning 2 weeks before surgery and continuing at the 1 tablet a day dose until the day after she comes off anticoagulants  Otherwise she will see me again in May. She will follow-up with Dr. Brigitte [name] in August. She knows to call for any other problems that may develop before her next visit [place] [facility]    10/09/2016

## 2016-10-10 LAB — IGG, IGA, IGM
IGA/IMMUNOGLOBULIN A, SERUM: 176 mg/dL (ref 87–352)
IgG, Qn, Serum: 480 mg/dL — ABNORMAL LOW (ref 700–1600)
IgM, Qn, Serum: 15 mg/dL — ABNORMAL LOW (ref 26–217)

## 2016-11-12 ENCOUNTER — Encounter (HOSPITAL_COMMUNITY): Payer: Self-pay | Admitting: *Deleted

## 2016-11-23 ENCOUNTER — Ambulatory Visit: Payer: Self-pay | Admitting: Orthopedic Surgery

## 2016-11-23 NOTE — Progress Notes (Signed)
Need orders in epic.  Surgery on 12/03/16.  preop on 11/28/16.  Thank You

## 2016-11-26 ENCOUNTER — Other Ambulatory Visit: Payer: Self-pay | Admitting: Emergency Medicine

## 2016-11-26 ENCOUNTER — Telehealth: Payer: Self-pay | Admitting: Pharmacist

## 2016-11-26 DIAGNOSIS — C9110 Chronic lymphocytic leukemia of B-cell type not having achieved remission: Secondary | ICD-10-CM

## 2016-11-26 MED ORDER — IBRUTINIB 420 MG PO TABS: 1.0000 | ORAL_TABLET | Freq: Every day | ORAL | 2 refills | Status: DC

## 2016-11-26 NOTE — Telephone Encounter (Signed)
Received message in [facility] left 11/23/16 at 2:14 pm.  Pt will need updated dosage form of Imbruvica to TABLET.  She is currently taking Capsules.  I have spoken w/ Jaci Carrel, RN and she has a fax from [employer] about the same info and she will have Dr. Jana Hakim complete and fax back today.  Kennith Center, Pharm.D., CPP 11/26/2016@10 :29 AM [facility]

## 2016-11-27 NOTE — Progress Notes (Signed)
  10-05-16 Medical clearance from Dr. Brigitte Pulse  10-10-16 [facility] Office Visit - Chest x-ray (2 view),   09-12-16 [facility]- EKG with tracing (  1-3-18[facility]- LABS (BMP, CBC w/differential, Blood glucose, PT-INR, Troponin, TSH)

## 2016-11-27 NOTE — Patient Instructions (Addendum)
[facility] DETJEN  11/27/2016   Your procedure is scheduled on:  Monday 12/03/16  Report to [facility] Main  Entrance and report to  Admitting at 0935 AM.   Call this number if you have problems the morning of surgery [phone]   Remember: ONLY 1 PERSON MAY GO WITH YOU TO SHORT STAY TO GET  READY MORNING OF [place].   Do not eat food or drink liquids :After Midnight.   Take these medicines the morning of surgery with A SIP OF WATER:  Synthroid                                You may not have any metal on your body including hair pins and              piercings  Do not wear jewelry, make-up, lotions, powders or perfumes, deodorant             Do not wear nail polish.  Do not shave  48 hours prior to surgery.              Men may shave face and neck.   Do not bring valuables to the hospital. [place].  Contacts, dentures or bridgework may not be worn into surgery.  Leave suitcase in the car. After surgery it may be brought to your room.                  Please read over the following fact sheets you were given: _____________________________________________________________________                        [facility] - Preparing for Surgery Before surgery, you can play an important role.  Because skin is not sterile, your skin needs to be as free of germs as possible.  You can reduce the number of germs on your skin by washing with CHG (chlorahexidine gluconate) soap before surgery.  CHG is an antiseptic cleaner which kills germs and bonds with the skin to continue killing germs even after washing. Please DO NOT use if you have an allergy to CHG or antibacterial soaps.  If your skin becomes reddened/irritated stop using the CHG and inform your nurse when you arrive at Short Stay. Do not shave (including legs and underarms) for at least 48 hours prior to the first CHG shower.  You may shave your  face/neck. Please follow these instructions carefully:  1.  Shower with CHG Soap the night before surgery and the  morning of Surgery.  2.  If you choose to wash your hair, wash your hair first as usual with your  normal  shampoo.  3.  After you shampoo, rinse your hair and body thoroughly to remove the  shampoo.                           4.  Use CHG as you would any other liquid soap.  You can apply chg directly  to the skin and wash                       Gently with a scrungie or clean washcloth.  5.  Apply the CHG Soap to your body ONLY FROM THE NECK DOWN.   Do not use on face/ open                           Wound or open sores. Avoid contact with eyes, ears mouth and genitals (private parts).                       Wash face,  Genitals (private parts) with your normal soap.             6.  Wash thoroughly, paying special attention to the area where your surgery  will be performed.  7.  Thoroughly rinse your body with warm water from the neck down.  8.  DO NOT shower/wash with your normal soap after using and rinsing off  the CHG Soap.                9.  Pat yourself dry with a clean towel.            10.  Wear clean pajamas.            11.  Place clean sheets on your bed the night of your first shower and do not  sleep with pets. Day of Surgery : Do not apply any lotions/deodorants the morning of surgery.  Please wear clean clothes to the hospital/surgery center.  FAILURE TO FOLLOW THESE INSTRUCTIONS MAY RESULT IN THE CANCELLATION OF YOUR SURGERY PATIENT SIGNATURE_________________________________  NURSE SIGNATURE__________________________________  ________________________________________________________________________    Adam Phenix  An incentive spirometer is a tool that can help keep your lungs clear and active. This tool measures how well you are filling your lungs with each breath. Taking long deep breaths may help reverse or decrease the chance of developing breathing  (pulmonary) problems (especially infection) following:  A long period of time when you are unable to move or be active. BEFORE THE PROCEDURE   If the spirometer includes an indicator to show your best effort, your nurse or respiratory therapist will set it to a desired goal.  If possible, sit up straight or lean slightly forward. Try not to slouch.  Hold the incentive spirometer in an upright position. INSTRUCTIONS FOR USE  1. Sit on the edge of your bed if possible, or sit up as far as you can in bed or on a chair. 2. Hold the incentive spirometer in an upright position. 3. Breathe out normally. 4. Place the mouthpiece in your mouth and seal your lips tightly around it. 5. Breathe in slowly and as deeply as possible, raising the piston or the ball toward the top of the column. 6. Hold your breath for 3-5 seconds or for as long as possible. Allow the piston or ball to fall to the bottom of the column. 7. Remove the mouthpiece from your mouth and breathe out normally. 8. Rest for a few seconds and repeat Steps 1 through 7 at least 10 times every 1-2 hours when you are awake. Take your time and take a few normal breaths between deep breaths. 9. The spirometer may include an indicator to show your best effort. Use the indicator as a goal to work toward during each repetition. 10. After each set of 10 deep breaths, practice coughing to be sure your lungs are clear. If you have an incision (the cut made at the time of surgery), support your incision when coughing by placing  a pillow or rolled up towels firmly against it. Once you are able to get out of bed, walk around indoors and cough well. You may stop using the incentive spirometer when instructed by your caregiver.  RISKS AND COMPLICATIONS  Take your time so you do not get dizzy or light-headed.  If you are in pain, you may need to take or ask for pain medication before doing incentive spirometry. It is harder to take a deep breath if you  are having pain. AFTER USE  Rest and breathe slowly and easily.  It can be helpful to keep track of a log of your progress. Your caregiver can provide you with a simple table to help with this. If you are using the spirometer at home, follow these instructions: [employer] IF:   You are having difficultly using the spirometer.  You have trouble using the spirometer as often as instructed.  Your pain medication is not giving enough relief while using the spirometer.  You develop fever of 100.5 F (38.1 C) or higher. SEEK IMMEDIATE MEDICAL CARE IF:   You cough up bloody sputum that had not been present before.  You develop fever of 102 F (38.9 C) or greater.  You develop worsening pain at or near the incision site. MAKE SURE YOU:   Understand these instructions.  Will watch your condition.  Will get help right away if you are not doing well or get worse. Document Released: 01/07/2007 Document Revised: 11/19/2011 Document Reviewed: 03/10/2007 ExitCare Patient Information 2014 ExitCare, [place].   ________________________________________________________________________  WHAT IS A BLOOD TRANSFUSION? Blood Transfusion Information  A transfusion is the replacement of blood or some of its parts. Blood is made up of multiple cells which provide different functions.  Red blood cells carry oxygen and are used for blood loss replacement.  White blood cells fight against infection.  Platelets control bleeding.  Plasma helps clot blood.  Other blood products are available for specialized needs, such as hemophilia or other clotting disorders. BEFORE THE TRANSFUSION  Who gives blood for transfusions?   Healthy volunteers who are fully evaluated to make sure their blood is safe. This is blood bank blood. Transfusion therapy is the safest it has ever been in the practice of medicine. Before blood is taken from a donor, a complete history is taken to make sure that person has  no history of diseases nor engages in risky social behavior (examples are intravenous drug use or sexual activity with multiple partners). The donor's travel history is screened to minimize risk of transmitting infections, such as malaria. The donated blood is tested for signs of infectious diseases, such as HIV and hepatitis. The blood is then tested to be sure it is compatible with you in order to minimize the chance of a transfusion reaction. If you or a relative donates blood, this is often done in anticipation of surgery and is not appropriate for emergency situations. It takes many days to process the donated blood. RISKS AND COMPLICATIONS Although transfusion therapy is very safe and saves many lives, the main dangers of transfusion include:   Getting an infectious disease.  Developing a transfusion reaction. This is an allergic reaction to something in the blood you were given. Every precaution is taken to prevent this. The decision to have a blood transfusion has been considered carefully by your caregiver before blood is given. Blood is not given unless the benefits outweigh the risks. AFTER THE TRANSFUSION  Right after receiving a blood transfusion,  you will usually feel much better and more energetic. This is especially true if your red blood cells have gotten low (anemic). The transfusion raises the level of the red blood cells which carry oxygen, and this usually causes an energy increase.  The nurse administering the transfusion will monitor you carefully for complications. HOME CARE INSTRUCTIONS  No special instructions are needed after a transfusion. You may find your energy is better. Speak with your caregiver about any limitations on activity for underlying diseases you may have. SEEK MEDICAL CARE IF:   Your condition is not improving after your transfusion.  You develop redness or irritation at the intravenous (IV) site. SEEK IMMEDIATE MEDICAL CARE IF:  Any of the following  symptoms occur over the next 12 hours:  Shaking chills.  You have a temperature by mouth above 102 F (38.9 C), not controlled by medicine.  Chest, back, or muscle pain.  People around you feel you are not acting correctly or are confused.  Shortness of breath or difficulty breathing.  Dizziness and fainting.  You get a rash or develop hives.  You have a decrease in urine output.  Your urine turns a dark color or changes to pink, red, or brown. Any of the following symptoms occur over the next 10 days:  You have a temperature by mouth above 102 F (38.9 C), not controlled by medicine.  Shortness of breath.  Weakness after normal activity.  The white part of the eye turns yellow (jaundice).  You have a decrease in the amount of urine or are urinating less often.  Your urine turns a dark color or changes to pink, red, or brown. Document Released: 08/24/2000 Document Revised: 11/19/2011 Document Reviewed: 04/12/2008 [facility] Patient Information 2014 [place], [place].  _______________________________________________________________________

## 2016-11-28 ENCOUNTER — Encounter (HOSPITAL_COMMUNITY)
Admission: RE | Admit: 2016-11-28 | Discharge: 2016-11-28 | Disposition: A | Discharge status: Home / Self Care | Payer: BC Managed Care – PPO | Source: Ambulatory Visit | Attending: Orthopedic Surgery | Admitting: Orthopedic Surgery

## 2016-11-28 ENCOUNTER — Encounter (HOSPITAL_COMMUNITY): Payer: Self-pay

## 2016-11-28 ENCOUNTER — Encounter (INDEPENDENT_AMBULATORY_CARE_PROVIDER_SITE_OTHER): Payer: Self-pay

## 2016-11-28 DIAGNOSIS — Z01812 Encounter for preprocedural laboratory examination: Secondary | ICD-10-CM | POA: Diagnosis not present

## 2016-11-28 DIAGNOSIS — M1711 Unilateral primary osteoarthritis, right knee: Secondary | ICD-10-CM | POA: Diagnosis not present

## 2016-11-28 HISTORY — DX: Hypothyroidism, unspecified: E03.9

## 2016-11-28 LAB — COMPREHENSIVE METABOLIC PANEL
ALT: 60 U/L — AB (ref 14–54)
AST: 43 U/L — ABNORMAL HIGH (ref 15–41)
Albumin: 4.1 g/dL (ref 3.5–5.0)
Alkaline Phosphatase: 111 U/L (ref 38–126)
Anion gap: 6 (ref 5–15)
BILIRUBIN TOTAL: 0.9 mg/dL (ref 0.3–1.2)
BUN: 16 mg/dL (ref 6–20)
CALCIUM: 9.4 mg/dL (ref 8.9–10.3)
CO2: 28 mmol/L (ref 22–32)
CREATININE: 0.93 mg/dL (ref 0.44–1.00)
Chloride: 102 mmol/L (ref 101–111)
Glucose, Bld: 124 mg/dL — ABNORMAL HIGH (ref 65–99)
Potassium: 3.9 mmol/L (ref 3.5–5.1)
Sodium: 136 mmol/L (ref 135–145)
TOTAL PROTEIN: 6.6 g/dL (ref 6.5–8.1)

## 2016-11-28 LAB — SURGICAL PCR SCREEN
MRSA, PCR: NEGATIVE
STAPHYLOCOCCUS AUREUS: NEGATIVE

## 2016-11-28 LAB — CBC
HEMATOCRIT: 43.2 % (ref 36.0–46.0)
Hemoglobin: 14.2 g/dL (ref 12.0–15.0)
MCH: 27.6 pg (ref 26.0–34.0)
MCHC: 32.9 g/dL (ref 30.0–36.0)
MCV: 84 fL (ref 78.0–100.0)
PLATELETS: 139 10*3/uL — AB (ref 150–400)
RBC: 5.14 MIL/uL — AB (ref 3.87–5.11)
RDW: 14.2 % (ref 11.5–15.5)
WBC: 5.2 10*3/uL (ref 4.0–10.5)

## 2016-11-28 LAB — PROTIME-INR
INR: 0.94
Prothrombin Time: 12.5 seconds (ref 11.4–15.2)

## 2016-11-28 LAB — ABO/RH: ABO/RH(D): A POS

## 2016-11-28 LAB — APTT: aPTT: 27 seconds (ref 24–36)

## 2016-11-28 LAB — GLUCOSE, CAPILLARY: GLUCOSE-CAPILLARY: 116 mg/dL — AB (ref 65–99)

## 2016-11-29 LAB — HEMOGLOBIN A1C
Hgb A1c MFr Bld: 6.2 % — ABNORMAL HIGH (ref 4.8–5.6)
MEAN PLASMA GLUCOSE: 131 mg/dL

## 2016-11-30 ENCOUNTER — Ambulatory Visit: Payer: Self-pay | Admitting: Orthopedic Surgery

## 2016-11-30 NOTE — H&P (Signed)
Maria [name] DOB: 11-16-1950 Single / Language: Cleophus Molt / Race: White Female Date of Admission:  12/03/2016 CC:  Right Knee Pain History of Present Illness  The patient is a 66 year old female who comes in for a preoperative History and Physical. The patient is scheduled for a right total knee arthroplasty to be performed by Dr. Dione Plover. Aluisio, MD at [facility] on 12-03-2016. The patient is a 66 year old female who presented for follow up of their knee. The patient is being followed for their right knee pain and osteoarthritis. They are now months out from cortisone injection. Symptoms reported include: pain, aching, stiffness, grinding (clicking) and instability. The patient feels that they are doing poorly and report their pain level to be moderate. The following medication has been used for pain control: Celebrex. The patient has reported improvement of their symptoms with: Cortisone injections (relief for 3 weeks). Her right knee has gotten progressively worse over time. Cortisone helped for about a week or two, but the pain is recurred. She is hurting with all activities now. The knee is limiting what she can and cannot do. AP and lateral of the right knee show bone-on-bone arthritis medial and patellofemoral. She has got osteoarthritic change in right knee with progressive worsening pain and dysfunction. We discussed knee replacement surgery in detail and she would like to go ahead and proceed with surgery as planned. They have been treated conservatively in the past for the above stated problem and despite conservative measures, they continue to have progressive pain and severe functional limitations and dysfunction. They have failed non-operative management including home exercise, medications, and injections. It is felt that they would benefit from undergoing total joint replacement. Risks and benefits of the procedure have been discussed with the patient and they elect to  proceed with surgery. There are no active contraindications to surgery such as ongoing infection or rapidly progressive neurological disease.  Problem List/Past Medical  Primary osteoarthritis of right knee (M17.11)  Cancer -CLL High blood pressure  Hypercholesterolemia  Hypothyroidism  Osteoarthritis  Osteoporosis  Leukemia  Migraine Headache  Impaired Vision  Shingles  Asthma  Varicose veins  Allergies Keflex *CEPHALOSPORINS*  Rash. Iodinated Contrast  Swelling in face and neck (Patient IS ABLE to use topical Betadine Products) Lactose  Intolerance  Family History Cancer  Mother, Paternal 9, Sister. Cerebrovascular Accident  Paternal Grandfather. Depression  Mother. Diabetes Mellitus  Paternal Grandmother. First Degree Relatives  reported Heart Disease  Father. Heart disease in female family member before age 48  Hypertension  Mother, Sister. Liver Disease, Chronic  Mother. Osteoarthritis  Maternal Grandmother.  Social History Children  0 Current work status  working full time Exercise  Exercises rarely; does other Former drinker  05/30/2016: In the past drank only occasionally per week Living situation  live alone Marital status  single No history of drug/alcohol rehab  Not under pain contract  Number of flights of stairs before winded  1 Tobacco / smoke exposure  05/30/2016: no Tobacco use  Never smoker. 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 Celecoxib (200MG  Capsule, Oral) Active. Imbruvica (140MG  Capsule, Oral) Active. Aleve (220MG  Capsule, 1 (one) Oral) Active. Tylenol (325MG  Tablet, 1 (one) Oral) Active. LORazepam (1MG  Tablet, Oral) Active.  Past Surgical History Hysterectomy  partial (non-cancerous) Rotator Cuff Repair  right Tonsillectomy    Review of Systems  General Not Present- Chills, Fatigue, Fever, Memory Loss, Night Sweats, Weight  Gain and Weight  Loss. Skin Not Present- Eczema, Hives, Itching, Lesions and Rash. HEENT Not Present- Dentures, Double Vision, Headache, Hearing Loss, Tinnitus and Visual Loss. Respiratory Not Present- Allergies, Chronic Cough, Coughing up blood, Shortness of breath at rest and Shortness of breath with exertion. Cardiovascular Not Present- Chest Pain, Difficulty Breathing Lying Down, Murmur, Palpitations, Racing/skipping heartbeats and Swelling. Gastrointestinal Not Present- Abdominal Pain, Bloody Stool, Constipation, Diarrhea, Difficulty Swallowing, Heartburn, Jaundice, Loss of appetitie, Nausea and Vomiting. Female Genitourinary Present- Incontinence (stress) and Urinary frequency. Not Present- Blood in Urine, Discharge, Flank Pain, Painful Urination, Urgency, Urinary Retention, Urinating at Night and Weak urinary stream. Musculoskeletal Present- Back Pain, Joint Pain, Joint Swelling and Morning Stiffness. Not Present- Muscle Pain, Muscle Weakness and Spasms. Neurological Not Present- Blackout spells, Difficulty with balance, Dizziness, Paralysis, Tremor and Weakness. Psychiatric Not Present- Insomnia.  Vitals  Weight: 198 lb Height: 67in Weight was reported by patient. Height was reported by patient. Body Surface Area: 2.01 m Body Mass Index: 31.01 kg/m  Pulse: 68 (Regular)  BP: 152/74 (Sitting, Right Arm, Standard)  Physical Exam General Mental Status -Alert, cooperative and good historian. General Appearance-pleasant, Not in acute distress. Orientation-Oriented X3. Build & Nutrition-Well nourished and Well developed.  Head and Neck Head-normocephalic, atraumatic . Neck Global Assessment - supple, no bruit auscultated on the right, no bruit auscultated on the left.  Eye Vision-Wears corrective lenses. Pupil - Bilateral-Regular and Round. Motion - Bilateral-EOMI.  Chest and Lung Exam Auscultation Breath sounds - clear at anterior chest wall and clear  at posterior chest wall. Adventitious sounds - No Adventitious sounds.  Cardiovascular Auscultation Rhythm - Regular rate and rhythm. Heart Sounds - S1 WNL and S2 WNL. Murmurs & Other Heart Sounds - Auscultation of the heart reveals - No Murmurs.  Abdomen Palpation/Percussion Tenderness - Abdomen is non-tender to palpation. Rigidity (guarding) - Abdomen is soft. Auscultation Auscultation of the abdomen reveals - Bowel sounds normal.  Female Genitourinary Note: Not done, not pertinent to present illness   Musculoskeletal Note: On exam, very pleasant, well-developed female, alert and oriented, in no apparent distress. Evaluation of her hips show normal range of motion, no discomfort. Right knee, no effusion. Range of motion of the right knee is about 5 to 115. There is moderate crepitus on range of motion with tenderness medial greater than lateral, no instability noted. Pulse, sensation, motor intact. Left knee exam is unremarkable.  RADIOGRAPHS AP and lateral of the right knee show bone-on-bone arthritis medial and patellofemoral  Assessment & Plan Primary osteoarthritis of right knee (M17.11)  Note:Surgical Plans: Right Total Knee Replacement  Disposition: Home with caregiver, Straight to outpatient therapy at [facility] starting on March 30.  PCP: Dr. Marton Redwood - Patient has been seen preoperatively and felt to be stable for surgery.  Topical TXA  Anesthesia Issues: None  Patient was instructed on what medications to stop prior to surgery.  Signed electronically by Joelene Millin, III PA-C

## 2016-12-03 ENCOUNTER — Inpatient Hospital Stay (HOSPITAL_COMMUNITY): Payer: BC Managed Care – PPO | Admitting: Anesthesiology

## 2016-12-03 ENCOUNTER — Inpatient Hospital Stay (HOSPITAL_COMMUNITY)
Admission: RE | Admit: 2016-12-03 | Discharge: 2016-12-05 | DRG: 470 | Disposition: A | Discharge status: Home / Self Care | Payer: BC Managed Care – PPO | Source: Ambulatory Visit | Attending: Orthopedic Surgery | Admitting: Orthopedic Surgery

## 2016-12-03 ENCOUNTER — Encounter (HOSPITAL_COMMUNITY): Payer: Self-pay | Admitting: *Deleted

## 2016-12-03 ENCOUNTER — Encounter (HOSPITAL_COMMUNITY)
Admission: RE | Disposition: A | Discharge status: Home / Self Care | Payer: Self-pay | Source: Ambulatory Visit | Attending: Orthopedic Surgery

## 2016-12-03 DIAGNOSIS — Z9889 Other specified postprocedural states: Secondary | ICD-10-CM

## 2016-12-03 DIAGNOSIS — Z833 Family history of diabetes mellitus: Secondary | ICD-10-CM | POA: Diagnosis not present

## 2016-12-03 DIAGNOSIS — M179 Osteoarthritis of knee, unspecified: Secondary | ICD-10-CM

## 2016-12-03 DIAGNOSIS — M1711 Unilateral primary osteoarthritis, right knee: Principal | ICD-10-CM | POA: Diagnosis present

## 2016-12-03 DIAGNOSIS — M1710 Unilateral primary osteoarthritis, unspecified knee: Secondary | ICD-10-CM

## 2016-12-03 DIAGNOSIS — Z8261 Family history of arthritis: Secondary | ICD-10-CM

## 2016-12-03 DIAGNOSIS — Z888 Allergy status to other drugs, medicaments and biological substances status: Secondary | ICD-10-CM

## 2016-12-03 DIAGNOSIS — I10 Essential (primary) hypertension: Secondary | ICD-10-CM | POA: Diagnosis present

## 2016-12-03 DIAGNOSIS — Z90711 Acquired absence of uterus with remaining cervical stump: Secondary | ICD-10-CM

## 2016-12-03 DIAGNOSIS — Z823 Family history of stroke: Secondary | ICD-10-CM

## 2016-12-03 DIAGNOSIS — E039 Hypothyroidism, unspecified: Secondary | ICD-10-CM | POA: Diagnosis present

## 2016-12-03 DIAGNOSIS — Z8249 Family history of ischemic heart disease and other diseases of the circulatory system: Secondary | ICD-10-CM

## 2016-12-03 DIAGNOSIS — Z809 Family history of malignant neoplasm, unspecified: Secondary | ICD-10-CM

## 2016-12-03 HISTORY — PX: TOTAL KNEE ARTHROPLASTY: SHX125

## 2016-12-03 LAB — GLUCOSE, CAPILLARY
GLUCOSE-CAPILLARY: 155 mg/dL — AB (ref 65–99)
GLUCOSE-CAPILLARY: 180 mg/dL — AB (ref 65–99)
Glucose-Capillary: 139 mg/dL — ABNORMAL HIGH (ref 65–99)
Glucose-Capillary: 134 mg/dL — ABNORMAL HIGH (ref 65–99)

## 2016-12-03 LAB — TYPE AND SCREEN
ABO/RH(D): A POS
Antibody Screen: NEGATIVE

## 2016-12-03 SURGERY — ARTHROPLASTY, KNEE, TOTAL
Anesthesia: Choice | Site: Knee | Laterality: Right

## 2016-12-03 SURGERY — ARTHROPLASTY, KNEE, TOTAL
Anesthesia: Spinal | Site: Knee | Laterality: Right

## 2016-12-03 MED ORDER — MIDAZOLAM HCL 2 MG/2ML IJ SOLN
INTRAMUSCULAR | Status: AC
  Filled 2016-12-03: qty 2

## 2016-12-03 MED ORDER — BUPIVACAINE IN DEXTROSE 0.75-8.25 % IT SOLN
INTRATHECAL | Status: DC | PRN
  Administered 2016-12-03: 2 mL via INTRATHECAL

## 2016-12-03 MED ORDER — VANCOMYCIN HCL IN DEXTROSE 1-5 GM/200ML-% IV SOLN
1000.0000 mg | INTRAVENOUS | Status: AC
  Administered 2016-12-03: 1000 mg via INTRAVENOUS

## 2016-12-03 MED ORDER — PROMETHAZINE HCL 25 MG/ML IJ SOLN
6.2500 mg | INTRAMUSCULAR | Status: DC | PRN
Start: 2016-12-03 — End: 2016-12-03

## 2016-12-03 MED ORDER — SODIUM CHLORIDE 0.9 % IJ SOLN
INTRAMUSCULAR | Status: AC
  Filled 2016-12-03: qty 50

## 2016-12-03 MED ORDER — METOCLOPRAMIDE HCL 5 MG/ML IJ SOLN
5.0000 mg | Freq: Three times a day (TID) | INTRAMUSCULAR | Status: DC | PRN
  Administered 2016-12-04: 08:00:00 10 mg via INTRAVENOUS
  Filled 2016-12-03: qty 2

## 2016-12-03 MED ORDER — DEXAMETHASONE SODIUM PHOSPHATE 10 MG/ML IJ SOLN
10.0000 mg | Freq: Once | INTRAMUSCULAR | Status: AC
  Administered 2016-12-04: 10 mg via INTRAVENOUS
  Filled 2016-12-03: qty 1

## 2016-12-03 MED ORDER — SODIUM CHLORIDE 0.9 % IR SOLN
Status: DC | PRN
  Administered 2016-12-03: 1000 mL

## 2016-12-03 MED ORDER — BISACODYL 10 MG RE SUPP: 10.0000 mg | Freq: Every day | RECTAL | Status: DC | PRN

## 2016-12-03 MED ORDER — METHOCARBAMOL 500 MG PO TABS
500.0000 mg | ORAL_TABLET | Freq: Four times a day (QID) | ORAL | Status: DC | PRN
  Administered 2016-12-03 – 2016-12-05 (×3): 500 mg via ORAL
  Filled 2016-12-03 (×3): qty 1

## 2016-12-03 MED ORDER — SODIUM CHLORIDE 0.9 % IV SOLN
2000.0000 mg | Freq: Once | INTRAVENOUS | Status: DC
  Filled 2016-12-03: qty 20

## 2016-12-03 MED ORDER — ACETAMINOPHEN 10 MG/ML IV SOLN
INTRAVENOUS | Status: AC
  Filled 2016-12-03: qty 100

## 2016-12-03 MED ORDER — PROPOFOL 10 MG/ML IV BOLUS
INTRAVENOUS | Status: AC
  Filled 2016-12-03: qty 20

## 2016-12-03 MED ORDER — GABAPENTIN 300 MG PO CAPS
300.0000 mg | ORAL_CAPSULE | Freq: Once | ORAL | Status: AC
  Administered 2016-12-03: 300 mg via ORAL

## 2016-12-03 MED ORDER — SODIUM CHLORIDE 0.9 % IV SOLN
INTRAVENOUS | Status: DC | PRN
  Administered 2016-12-03: 2000 mg via TOPICAL

## 2016-12-03 MED ORDER — ACETAMINOPHEN 10 MG/ML IV SOLN
1000.0000 mg | Freq: Once | INTRAVENOUS | Status: AC
  Administered 2016-12-03: 1000 mg via INTRAVENOUS

## 2016-12-03 MED ORDER — CHLORHEXIDINE GLUCONATE 4 % EX LIQD: 60.0000 mL | Freq: Once | CUTANEOUS | Status: DC

## 2016-12-03 MED ORDER — RIVAROXABAN 10 MG PO TABS
10.0000 mg | ORAL_TABLET | Freq: Every day | ORAL | Status: DC
  Administered 2016-12-04 – 2016-12-05 (×2): 10 mg via ORAL
  Filled 2016-12-03 (×2): qty 1

## 2016-12-03 MED ORDER — PROPOFOL 500 MG/50ML IV EMUL
INTRAVENOUS | Status: DC | PRN
  Administered 2016-12-03: 75 ug/kg/min via INTRAVENOUS

## 2016-12-03 MED ORDER — ONDANSETRON HCL 4 MG/2ML IJ SOLN
INTRAMUSCULAR | Status: AC
  Filled 2016-12-03: qty 2

## 2016-12-03 MED ORDER — OXYCODONE HCL 5 MG PO TABS
5.0000 mg | ORAL_TABLET | ORAL | Status: DC | PRN
  Administered 2016-12-03: 21:00:00 5 mg via ORAL
  Administered 2016-12-04 (×2): 10 mg via ORAL
  Filled 2016-12-03: qty 2
  Filled 2016-12-03 (×2): qty 1
  Filled 2016-12-03: qty 2

## 2016-12-03 MED ORDER — ONDANSETRON HCL 4 MG/2ML IJ SOLN
INTRAMUSCULAR | Status: DC | PRN
  Administered 2016-12-03: 4 mg via INTRAVENOUS

## 2016-12-03 MED ORDER — SODIUM CHLORIDE 0.9 % IV SOLN
INTRAVENOUS | Status: DC
  Administered 2016-12-03 – 2016-12-04 (×2): via INTRAVENOUS

## 2016-12-03 MED ORDER — FLEET ENEMA 7-19 GM/118ML RE ENEM: 1.0000 | ENEMA | Freq: Once | RECTAL | Status: DC | PRN

## 2016-12-03 MED ORDER — ONDANSETRON HCL 4 MG PO TABS
4.0000 mg | ORAL_TABLET | Freq: Four times a day (QID) | ORAL | Status: DC | PRN
  Administered 2016-12-04: 06:00:00 4 mg via ORAL
  Filled 2016-12-03: qty 1

## 2016-12-03 MED ORDER — IBRUTINIB 420 MG PO TABS
1.0000 | ORAL_TABLET | Freq: Every day | ORAL | Status: DC
  Administered 2016-12-03: 22:00:00 140 mg via ORAL
  Administered 2016-12-04: 22:00:00 1 via ORAL
  Filled 2016-12-03: qty 1

## 2016-12-03 MED ORDER — POLYETHYLENE GLYCOL 3350 17 G PO PACK: 17.0000 g | PACK | Freq: Every day | ORAL | Status: DC | PRN

## 2016-12-03 MED ORDER — HYDROCHLOROTHIAZIDE 25 MG PO TABS
12.5000 mg | ORAL_TABLET | Freq: Every day | ORAL | Status: DC
  Administered 2016-12-03 – 2016-12-05 (×3): 12.5 mg via ORAL
  Filled 2016-12-03 (×3): qty 1

## 2016-12-03 MED ORDER — DEXAMETHASONE SODIUM PHOSPHATE 10 MG/ML IJ SOLN
INTRAMUSCULAR | Status: AC
  Filled 2016-12-03: qty 1

## 2016-12-03 MED ORDER — MENTHOL 3 MG MT LOZG: 1.0000 | LOZENGE | OROMUCOSAL | Status: DC | PRN

## 2016-12-03 MED ORDER — TRAMADOL HCL 50 MG PO TABS
50.0000 mg | ORAL_TABLET | Freq: Four times a day (QID) | ORAL | Status: DC | PRN
Start: 2016-12-03 — End: 2016-12-05

## 2016-12-03 MED ORDER — DOCUSATE SODIUM 100 MG PO CAPS
100.0000 mg | ORAL_CAPSULE | Freq: Two times a day (BID) | ORAL | Status: DC
  Administered 2016-12-03 – 2016-12-05 (×4): 100 mg via ORAL
  Filled 2016-12-03 (×4): qty 1

## 2016-12-03 MED ORDER — HYDROMORPHONE HCL 1 MG/ML IJ SOLN: 0.2500 mg | INTRAMUSCULAR | Status: DC | PRN

## 2016-12-03 MED ORDER — GABAPENTIN 300 MG PO CAPS
ORAL_CAPSULE | ORAL | Status: AC
  Administered 2016-12-03: 300 mg via ORAL
  Filled 2016-12-03: qty 1

## 2016-12-03 MED ORDER — LORAZEPAM 1 MG PO TABS: 1.0000 mg | ORAL_TABLET | Freq: Every day | ORAL | Status: DC | PRN

## 2016-12-03 MED ORDER — BUPIVACAINE LIPOSOME 1.3 % IJ SUSP
INTRAMUSCULAR | Status: DC | PRN
  Administered 2016-12-03: 20 mL

## 2016-12-03 MED ORDER — FENTANYL CITRATE (PF) 100 MCG/2ML IJ SOLN
INTRAMUSCULAR | Status: AC
  Administered 2016-12-03: 50 ug
  Filled 2016-12-03: qty 2

## 2016-12-03 MED ORDER — FENTANYL CITRATE (PF) 100 MCG/2ML IJ SOLN
INTRAMUSCULAR | Status: DC | PRN
  Administered 2016-12-03: 50 ug via INTRAVENOUS

## 2016-12-03 MED ORDER — ACETAMINOPHEN 650 MG RE SUPP: 650.0000 mg | Freq: Four times a day (QID) | RECTAL | Status: DC | PRN

## 2016-12-03 MED ORDER — FENTANYL CITRATE (PF) 100 MCG/2ML IJ SOLN
INTRAMUSCULAR | Status: AC
  Filled 2016-12-03: qty 2

## 2016-12-03 MED ORDER — SODIUM CHLORIDE 0.9 % IJ SOLN
INTRAMUSCULAR | Status: DC | PRN
  Administered 2016-12-03: 50 mL

## 2016-12-03 MED ORDER — ACETAMINOPHEN 500 MG PO TABS
1000.0000 mg | ORAL_TABLET | Freq: Four times a day (QID) | ORAL | Status: AC
  Administered 2016-12-03 – 2016-12-04 (×3): 1000 mg via ORAL
  Filled 2016-12-03 (×4): qty 2

## 2016-12-03 MED ORDER — LEVOTHYROXINE SODIUM 88 MCG PO TABS
88.0000 ug | ORAL_TABLET | Freq: Every day | ORAL | Status: DC
  Administered 2016-12-04 – 2016-12-05 (×2): 88 ug via ORAL
  Filled 2016-12-03 (×2): qty 1

## 2016-12-03 MED ORDER — DEXAMETHASONE SODIUM PHOSPHATE 10 MG/ML IJ SOLN
10.0000 mg | Freq: Once | INTRAMUSCULAR | Status: AC
  Administered 2016-12-03: 10 mg via INTRAVENOUS

## 2016-12-03 MED ORDER — LACTATED RINGERS IV SOLN
INTRAVENOUS | Status: DC
  Administered 2016-12-03: 11:00:00 via INTRAVENOUS

## 2016-12-03 MED ORDER — BUPIVACAINE-EPINEPHRINE (PF) 0.5% -1:200000 IJ SOLN
INTRAMUSCULAR | Status: DC | PRN
  Administered 2016-12-03: 30 mL via PERINEURAL

## 2016-12-03 MED ORDER — MIDAZOLAM HCL 2 MG/2ML IJ SOLN
INTRAMUSCULAR | Status: AC
  Administered 2016-12-03: 2 mg
  Filled 2016-12-03: qty 2

## 2016-12-03 MED ORDER — PROPOFOL 10 MG/ML IV BOLUS
INTRAVENOUS | Status: AC
  Filled 2016-12-03: qty 40

## 2016-12-03 MED ORDER — MEPERIDINE HCL 50 MG/ML IJ SOLN: 6.2500 mg | INTRAMUSCULAR | Status: DC | PRN

## 2016-12-03 MED ORDER — VANCOMYCIN HCL IN DEXTROSE 1-5 GM/200ML-% IV SOLN
1000.0000 mg | Freq: Two times a day (BID) | INTRAVENOUS | Status: AC
  Administered 2016-12-03: 1000 mg via INTRAVENOUS
  Filled 2016-12-03: qty 200

## 2016-12-03 MED ORDER — MORPHINE SULFATE (PF) 4 MG/ML IV SOLN: 1.0000 mg | INTRAVENOUS | Status: DC | PRN

## 2016-12-03 MED ORDER — METOCLOPRAMIDE HCL 5 MG PO TABS: 5.0000 mg | ORAL_TABLET | Freq: Three times a day (TID) | ORAL | Status: DC | PRN

## 2016-12-03 MED ORDER — DIPHENHYDRAMINE HCL 12.5 MG/5ML PO ELIX: 12.5000 mg | ORAL_SOLUTION | ORAL | Status: DC | PRN

## 2016-12-03 MED ORDER — METHOCARBAMOL 1000 MG/10ML IJ SOLN
500.0000 mg | Freq: Four times a day (QID) | INTRAVENOUS | Status: DC | PRN
  Administered 2016-12-03: 500 mg via INTRAVENOUS
  Filled 2016-12-03: qty 550
  Filled 2016-12-03: qty 5

## 2016-12-03 MED ORDER — PHENOL 1.4 % MT LIQD
1.0000 | OROMUCOSAL | Status: DC | PRN
  Filled 2016-12-03: qty 177

## 2016-12-03 MED ORDER — GABAPENTIN 600 MG PO TABS
300.0000 mg | ORAL_TABLET | Freq: Once | ORAL | Status: DC
  Filled 2016-12-03: qty 0.5

## 2016-12-03 MED ORDER — BUPIVACAINE LIPOSOME 1.3 % IJ SUSP
20.0000 mL | Freq: Once | INTRAMUSCULAR | Status: DC
  Filled 2016-12-03: qty 20

## 2016-12-03 MED ORDER — ONDANSETRON HCL 4 MG/2ML IJ SOLN
4.0000 mg | Freq: Four times a day (QID) | INTRAMUSCULAR | Status: DC | PRN
  Administered 2016-12-03: 4 mg via INTRAVENOUS
  Filled 2016-12-03: qty 2

## 2016-12-03 MED ORDER — VANCOMYCIN HCL IN DEXTROSE 1-5 GM/200ML-% IV SOLN
INTRAVENOUS | Status: AC
  Filled 2016-12-03: qty 200

## 2016-12-03 MED ORDER — ACETAMINOPHEN 325 MG PO TABS: 650.0000 mg | ORAL_TABLET | Freq: Four times a day (QID) | ORAL | Status: DC | PRN

## 2016-12-03 SURGICAL SUPPLY — 49 items
BAG DECANTER FOR FLEXI CONT (MISCELLANEOUS) ×3 IMPLANT
BAG SPEC THK2 15X12 ZIP CLS (MISCELLANEOUS) ×1
BAG ZIPLOCK 12X15 (MISCELLANEOUS) ×3 IMPLANT
BANDAGE ACE 6X5 VEL STRL LF (GAUZE/BANDAGES/DRESSINGS) ×3 IMPLANT
BLADE SAG 18X100X1.27 (BLADE) ×3 IMPLANT
BLADE SAW SGTL 11.0X1.19X90.0M (BLADE) ×3 IMPLANT
BOWL SMART MIX CTS (DISPOSABLE) ×3 IMPLANT
CAPT KNEE TOTAL 3 ATTUNE ×3 IMPLANT
CEMENT HV SMART SET (Cement) ×6 IMPLANT
CLOSURE WOUND 1/2 X4 (GAUZE/BANDAGES/DRESSINGS) ×2
CUFF TOURN SGL QUICK 34 (TOURNIQUET CUFF) ×2
CUFF TRNQT CYL 34X4X40X1 (TOURNIQUET CUFF) ×1 IMPLANT
DECANTER SPIKE VIAL GLASS SM (MISCELLANEOUS) ×3 IMPLANT
DRAPE U-SHAPE 47X51 STRL (DRAPES) ×3 IMPLANT
DRSG ADAPTIC 3X8 NADH LF (GAUZE/BANDAGES/DRESSINGS) ×3 IMPLANT
DRSG PAD ABDOMINAL 8X10 ST (GAUZE/BANDAGES/DRESSINGS) ×3 IMPLANT
DURAPREP 26ML APPLICATOR (WOUND CARE) ×3 IMPLANT
ELECT REM PT RETURN 15FT ADLT (MISCELLANEOUS) ×3 IMPLANT
EVACUATOR 1/8 PVC DRAIN (DRAIN) ×3 IMPLANT
GAUZE SPONGE 4X4 12PLY STRL (GAUZE/BANDAGES/DRESSINGS) ×3 IMPLANT
GLOVE BIO SURGEON STRL SZ7.5 (GLOVE) IMPLANT
GLOVE BIO SURGEON STRL SZ8 (GLOVE) ×3 IMPLANT
GLOVE BIOGEL PI IND STRL 6.5 (GLOVE) ×6 IMPLANT
GLOVE BIOGEL PI IND STRL 8 (GLOVE) ×1 IMPLANT
GLOVE BIOGEL PI INDICATOR 6.5 (GLOVE) ×12
GLOVE BIOGEL PI INDICATOR 8 (GLOVE) ×2
GLOVE SURG SS PI 6.5 STRL IVOR (GLOVE) ×18 IMPLANT
GOWN STRL REUS W/TWL LRG LVL3 (GOWN DISPOSABLE) ×12 IMPLANT
GOWN STRL REUS W/TWL XL LVL3 (GOWN DISPOSABLE) IMPLANT
HANDPIECE INTERPULSE COAX TIP (DISPOSABLE) ×3
IMMOBILIZER KNEE 20 (SOFTGOODS) ×3
IMMOBILIZER KNEE 20 THIGH 36 (SOFTGOODS) ×1 IMPLANT
MANIFOLD NEPTUNE II (INSTRUMENTS) ×3 IMPLANT
NS IRRIG 1000ML POUR BTL (IV SOLUTION) ×6 IMPLANT
PACK TOTAL KNEE CUSTOM (KITS) ×3 IMPLANT
PADDING CAST COTTON 6X4 STRL (CAST SUPPLIES) ×3 IMPLANT
POSITIONER SURGICAL ARM (MISCELLANEOUS) ×3 IMPLANT
SET HNDPC FAN SPRY TIP SCT (DISPOSABLE) ×1 IMPLANT
STRIP CLOSURE SKIN 1/2X4 (GAUZE/BANDAGES/DRESSINGS) ×4 IMPLANT
SUT MNCRL AB 4-0 PS2 18 (SUTURE) ×3 IMPLANT
SUT STRATAFIX 0 PDS 27 VIOLET (SUTURE) ×3
SUT VIC AB 2-0 CT1 27 (SUTURE) ×9
SUT VIC AB 2-0 CT1 TAPERPNT 27 (SUTURE) ×3 IMPLANT
SUTURE STRATFX 0 PDS 27 VIOLET (SUTURE) ×1 IMPLANT
SYR 50ML LL SCALE MARK (SYRINGE) ×3 IMPLANT
TRAY FOLEY CATH 14FRSI W/METER (CATHETERS) ×3 IMPLANT
WATER STERILE IRR 1000ML POUR (IV SOLUTION) ×6 IMPLANT
WRAP KNEE MAXI GEL POST OP (GAUZE/BANDAGES/DRESSINGS) ×3 IMPLANT
YANKAUER SUCT BULB TIP 10FT TU (MISCELLANEOUS) ×3 IMPLANT

## 2016-12-03 NOTE — Interval H&P Note (Signed)
History and Physical Interval Note:  12/03/2016 10:47 AM  Maria [name]  has presented today for surgery, with the diagnosis of Right knee osteoarthritis  The various methods of treatment have been discussed with the patient and family. After consideration of risks, benefits and other options for treatment, the patient has consented to  Procedure(s): RIGHT TOTAL KNEE ARTHROPLASTY (Right) as a surgical intervention .  The patient's history has been reviewed, patient examined, no change in status, stable for surgery.  I have reviewed the patient's chart and labs.  Questions were answered to the patient's satisfaction.     Gearlean Alf

## 2016-12-03 NOTE — H&P (View-Only) (Signed)
BERNESE DOFFING DOB: 1950/12/07 Single / Language: Cleophus Molt / Race: White Female Date of Admission:  12/03/2016 CC:  Right Knee Pain History of Present Illness  The patient is a 66 year old female who comes in for a preoperative History and Physical. The patient is scheduled for a right total knee arthroplasty to be performed by Dr. Dione Plover. Aluisio, MD at [facility] on 12-03-2016. The patient is a 66 year old female who presented for follow up of their knee. The patient is being followed for their right knee pain and osteoarthritis. They are now months out from cortisone injection. Symptoms reported include: pain, aching, stiffness, grinding (clicking) and instability. The patient feels that they are doing poorly and report their pain level to be moderate. The following medication has been used for pain control: Celebrex. The patient has reported improvement of their symptoms with: Cortisone injections (relief for 3 weeks). Her right knee has gotten progressively worse over time. Cortisone helped for about a week or two, but the pain is recurred. She is hurting with all activities now. The knee is limiting what she can and cannot do. AP and lateral of the right knee show bone-on-bone arthritis medial and patellofemoral. She has got osteoarthritic change in right knee with progressive worsening pain and dysfunction. We discussed knee replacement surgery in detail and she would like to go ahead and proceed with surgery as planned. They have been treated conservatively in the past for the above stated problem and despite conservative measures, they continue to have progressive pain and severe functional limitations and dysfunction. They have failed non-operative management including home exercise, medications, and injections. It is felt that they would benefit from undergoing total joint replacement. Risks and benefits of the procedure have been discussed with the patient and they elect to  proceed with surgery. There are no active contraindications to surgery such as ongoing infection or rapidly progressive neurological disease.  Problem List/Past Medical  Primary osteoarthritis of right knee (M17.11)  Cancer -CLL High blood pressure  Hypercholesterolemia  Hypothyroidism  Osteoarthritis  Osteoporosis  Leukemia  Migraine Headache  Impaired Vision  Shingles  Asthma  Varicose veins  Allergies Keflex *CEPHALOSPORINS*  Rash. Iodinated Contrast  Swelling in face and neck (Patient IS ABLE to use topical Betadine Products) Lactose  Intolerance  Family History Cancer  Mother, Paternal 43, Sister. Cerebrovascular Accident  Paternal Grandfather. Depression  Mother. Diabetes Mellitus  Paternal Grandmother. First Degree Relatives  reported Heart Disease  Father. Heart disease in female family member before age 72  Hypertension  Mother, Sister. Liver Disease, Chronic  Mother. Osteoarthritis  Maternal Grandmother.  Social History Children  0 Current work status  working full time Exercise  Exercises rarely; does other Former drinker  05/30/2016: In the past drank only occasionally per week Living situation  live alone Marital status  single No history of drug/alcohol rehab  Not under pain contract  Number of flights of stairs before winded  1 Tobacco / smoke exposure  05/30/2016: no Tobacco use  Never smoker. 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 Celecoxib (200MG  Capsule, Oral) Active. Imbruvica (140MG  Capsule, Oral) Active. Aleve (220MG  Capsule, 1 (one) Oral) Active. Tylenol (325MG  Tablet, 1 (one) Oral) Active. LORazepam (1MG  Tablet, Oral) Active.  Past Surgical History Hysterectomy  partial (non-cancerous) Rotator Cuff Repair  right Tonsillectomy    Review of Systems  General Not Present- Chills, Fatigue, Fever, Memory Loss, Night Sweats, Weight  Gain and Weight  Loss. Skin Not Present- Eczema, Hives, Itching, Lesions and Rash. HEENT Not Present- Dentures, Double Vision, Headache, Hearing Loss, Tinnitus and Visual Loss. Respiratory Not Present- Allergies, Chronic Cough, Coughing up blood, Shortness of breath at rest and Shortness of breath with exertion. Cardiovascular Not Present- Chest Pain, Difficulty Breathing Lying Down, Murmur, Palpitations, Racing/skipping heartbeats and Swelling. Gastrointestinal Not Present- Abdominal Pain, Bloody Stool, Constipation, Diarrhea, Difficulty Swallowing, Heartburn, Jaundice, Loss of appetitie, Nausea and Vomiting. Female Genitourinary Present- Incontinence (stress) and Urinary frequency. Not Present- Blood in Urine, Discharge, Flank Pain, Painful Urination, Urgency, Urinary Retention, Urinating at Night and Weak urinary stream. Musculoskeletal Present- Back Pain, Joint Pain, Joint Swelling and Morning Stiffness. Not Present- Muscle Pain, Muscle Weakness and Spasms. Neurological Not Present- Blackout spells, Difficulty with balance, Dizziness, Paralysis, Tremor and Weakness. Psychiatric Not Present- Insomnia.  Vitals  Weight: 198 lb Height: 67in Weight was reported by patient. Height was reported by patient. Body Surface Area: 2.01 m Body Mass Index: 31.01 kg/m  Pulse: 68 (Regular)  BP: 152/74 (Sitting, Right Arm, Standard)  Physical Exam General Mental Status -Alert, cooperative and good historian. General Appearance-pleasant, Not in acute distress. Orientation-Oriented X3. Build & Nutrition-Well nourished and Well developed.  Head and Neck Head-normocephalic, atraumatic . Neck Global Assessment - supple, no bruit auscultated on the right, no bruit auscultated on the left.  Eye Vision-Wears corrective lenses. Pupil - Bilateral-Regular and Round. Motion - Bilateral-EOMI.  Chest and Lung Exam Auscultation Breath sounds - clear at anterior chest wall and clear  at posterior chest wall. Adventitious sounds - No Adventitious sounds.  Cardiovascular Auscultation Rhythm - Regular rate and rhythm. Heart Sounds - S1 WNL and S2 WNL. Murmurs & Other Heart Sounds - Auscultation of the heart reveals - No Murmurs.  Abdomen Palpation/Percussion Tenderness - Abdomen is non-tender to palpation. Rigidity (guarding) - Abdomen is soft. Auscultation Auscultation of the abdomen reveals - Bowel sounds normal.  Female Genitourinary Note: Not done, not pertinent to present illness   Musculoskeletal Note: On exam, very pleasant, well-developed female, alert and oriented, in no apparent distress. Evaluation of her hips show normal range of motion, no discomfort. Right knee, no effusion. Range of motion of the right knee is about 5 to 115. There is moderate crepitus on range of motion with tenderness medial greater than lateral, no instability noted. Pulse, sensation, motor intact. Left knee exam is unremarkable.  RADIOGRAPHS AP and lateral of the right knee show bone-on-bone arthritis medial and patellofemoral  Assessment & Plan Primary osteoarthritis of right knee (M17.11)  Note:Surgical Plans: Right Total Knee Replacement  Disposition: Home with caregiver, Straight to outpatient therapy at [facility] starting on March 30.  PCP: Dr. Marton Redwood - Patient has been seen preoperatively and felt to be stable for surgery.  Topical TXA  Anesthesia Issues: None  Patient was instructed on what medications to stop prior to surgery.  Signed electronically by Joelene Millin, III PA-C

## 2016-12-03 NOTE — Op Note (Signed)
OPERATIVE REPORT-TOTAL KNEE ARTHROPLASTY   Pre-operative diagnosis- Osteoarthritis  Right knee(s)  Post-operative diagnosis- Osteoarthritis Right knee(s)  Procedure-  Right  Total Knee Arthroplasty (Depuy Attune)  Surgeon- Dione Plover. Cletis Clack, MD  Assistant- Ardeen Jourdain, PA-C   Anesthesia-  adductor canal block and spinal  EBL-* No blood loss amount entered *   Drains Hemovac  Tourniquet time-  Total Tourniquet Time Documented: Thigh (Left) - 34 minutes Total: Thigh (Left) - 34 minutes     Complications- None  Condition-PACU - hemodynamically stable.   Brief Clinical Note  Maria [name] is a 66 y.o. year old female with end stage OA of her right knee with progressively worsening pain and dysfunction. She has constant pain, with activity and at rest and significant functional deficits with difficulties even with ADLs. She has had extensive non-op management including analgesics, injections of cortisone and viscosupplements, and home exercise program, but remains in significant pain with significant dysfunction.Radiographs show bone on bone arthritis lateral and patellofemoral. She presents now for right Total Knee Arthroplasty.    Procedure in detail---   The patient is brought into the operating room and positioned supine on the operating table. After successful administration of  adductor canal block and spinal,   a tourniquet is placed high on the  Right thigh(s) and the lower extremity is prepped and draped in the usual sterile fashion. Time out is performed by the operating team and then the  Right lower extremity is wrapped in Esmarch, knee flexed and the tourniquet inflated to 300 mmHg.       A midline incision is made with a ten blade through the subcutaneous tissue to the level of the extensor mechanism. A fresh blade is used to make a medial parapatellar arthrotomy. Soft tissue over the proximal medial tibia is subperiosteally elevated to the joint line with a  knife and into the semimembranosus bursa with a Cobb elevator. Soft tissue over the proximal lateral tibia is elevated with attention being paid to avoiding the patellar tendon on the tibial tubercle. The patella is everted, knee flexed 90 degrees and the ACL and PCL are removed. Findings are bone on bone lateral and patellofemoral with large global osteophytes.        The drill is used to create a starting hole in the distal femur and the canal is thoroughly irrigated with sterile saline to remove the fatty contents. The 5 degree Right  valgus alignment guide is placed into the femoral canal and the distal femoral cutting block is pinned to remove 10 mm off the distal femur. Resection is made with an oscillating saw.      The tibia is subluxed forward and the menisci are removed. The extramedullary alignment guide is placed referencing proximally at the medial aspect of the tibial tubercle and distally along the second metatarsal axis and tibial crest. The block is pinned to remove 20mm off the more deficient lateral  side. Resection is made with an oscillating saw. Size 5is the most appropriate size for the tibia and the proximal tibia is prepared with the modular drill and keel punch for that size.      The femoral sizing guide is placed and size 5 is most appropriate. Rotation is marked off the epicondylar axis and confirmed by creating a rectangular flexion gap at 90 degrees. The size 5 cutting block is pinned in this rotation and the anterior, posterior and chamfer cuts are made with the oscillating saw. The intercondylar block is then placed  and that cut is made.      Trial size 5 tibial component, trial size 5 posterior stabilized femur and a 8  mm posterior stabilized rotating platform insert trial is placed. Full extension is achieved with excellent varus/valgus and anterior/posterior balance throughout full range of motion. The patella is everted and thickness measured to be 22  mm. Free hand resection  is taken to 12 mm, a 38 template is placed, lug holes are drilled, trial patella is placed, and it tracks normally. Osteophytes are removed off the posterior femur with the trial in place. All trials are removed and the cut bone surfaces prepared with pulsatile lavage. Cement is mixed and once ready for implantation, the size 5 tibial implant, size  5 posterior stabilized femoral component, and the size 38 patella are cemented in place and the patella is held with the clamp. The trial insert is placed and the knee held in full extension. The Exparel (20 ml mixed with 30 ml saline) is injected into the extensor mechanism, posterior capsule, medial and lateral gutters and subcutaneous tissues.  All extruded cement is removed and once the cement is hard the permanent 8 mm posterior stabilized rotating platform insert is placed into the tibial tray.      The wound is copiously irrigated with saline solution and the extensor mechanism closed over a hemovac drain with #1 V-loc suture. The tourniquet is released for a total tourniquet time of 34  minutes. Flexion against gravity is 140 degrees and the patella tracks normally. Subcutaneous tissue is closed with 2.0 vicryl and subcuticular with running 4.0 Monocryl. The incision is cleaned and dried and steri-strips and a bulky sterile dressing are applied. The limb is placed into a knee immobilizer and the patient is awakened and transported to recovery in stable condition.      Please note that a surgical assistant was a medical necessity for this procedure in order to perform it in a safe and expeditious manner. Surgical assistant was necessary to retract the ligaments and vital neurovascular structures to prevent injury to them and also necessary for proper positioning of the limb to allow for anatomic placement of the prosthesis.   Dione Plover Maria Marlatt, MD    12/03/2016, 12:49 PM

## 2016-12-03 NOTE — Progress Notes (Signed)
AssistedDr. Foster with right, ultrasound guided, adductor canal block. Side rails up, monitors on throughout procedure. See vital signs in flow sheet. Tolerated Procedure well.  

## 2016-12-03 NOTE — Anesthesia Postprocedure Evaluation (Signed)
Anesthesia Post Note  Patient: Maria [name]  Procedure(s) Performed: Procedure(s) (LRB): RIGHT TOTAL KNEE ARTHROPLASTY (Right)  Patient location during evaluation: PACU Anesthesia Type: Spinal Level of consciousness: oriented and awake and alert Pain management: pain level controlled Vital Signs Assessment: post-procedure vital signs reviewed and stable Respiratory status: spontaneous breathing, respiratory function stable and nonlabored ventilation Cardiovascular status: blood pressure returned to baseline and stable Postop Assessment: no headache, no backache, patient able to bend at knees, no signs of nausea or vomiting and spinal receding Anesthetic complications: no       Last Vitals:  Vitals:   12/03/16 1345 12/03/16 1400  BP: 133/63 (!) 143/58  Pulse: 70 72  Resp: 11 15  Temp:      Last Pain:  Vitals:   12/03/16 1400  TempSrc:   PainSc: 0-No pain                 Lashann Hagg A.

## 2016-12-03 NOTE — Anesthesia Procedure Notes (Addendum)
Anesthesia Regional Block: Adductor canal block   Pre-Anesthetic Checklist: ,, timeout performed, Correct Patient, Correct Site, Correct Laterality, Correct Procedure, Correct Position, site marked, Risks and benefits discussed,  Surgical consent,  Pre-op evaluation,  At surgeon's request and post-op pain management  Laterality: Right  Prep: chloraprep       Needles:  Injection technique: Single-shot  Needle Type: Echogenic Stimulator Needle     Needle Length: 9cm  Needle Gauge: 21   Needle insertion depth: 5 cm   Additional Needles:   Procedures: ultrasound guided,,,,,,,,  Narrative:  Start time: 12/03/2016 11:24 AM End time: 12/03/2016 11:29 AM Injection made incrementally with aspirations every 5 mL.  Performed by: Personally  Anesthesiologist: Josephine Igo  Additional Notes: Timeout performed. Patient sedated. Relevant anatomy ID'd using [place]. Incremental 75ml injection with frequent aspiration. Patient tolerated procedure well.

## 2016-12-03 NOTE — Anesthesia Preprocedure Evaluation (Addendum)
Anesthesia Evaluation  Patient identified by MRN, date of birth, ID band Patient awake    Reviewed: Allergy & Precautions, NPO status , Patient's Chart, lab work & pertinent test results  Airway Mallampati: II       Dental  (+) Teeth Intact   Pulmonary asthma ,    Pulmonary exam normal breath sounds clear to auscultation       Cardiovascular hypertension, Pt. on medications Normal cardiovascular exam Rhythm:Regular Rate:Normal     Neuro/Psych negative neurological ROS  negative psych ROS   GI/Hepatic negative GI ROS, Mildly elevated LFT's   Endo/Other  diabetes, Well Controlled, Type 2Hypothyroidism Obesity Hyperlipidemia  Renal/GU negative Renal ROS  negative genitourinary   Musculoskeletal  (+) Arthritis , Osteoarthritis,  Right Knee OA   Abdominal   Peds  Hematology Thrombocytopenia-mild CLL   Anesthesia Other Findings   Reproductive/Obstetrics                             Anesthesia Physical Anesthesia Plan  ASA: III  Anesthesia Plan: Spinal   Post-op Pain Management:  Regional for Post-op pain   Induction: Inhalational  Airway Management Planned: Natural Airway  Additional Equipment:   Intra-op Plan:   Post-operative Plan:   Informed Consent: I have reviewed the patients History and Physical, chart, labs and discussed the procedure including the risks, benefits and alternatives for the proposed anesthesia with the patient or authorized representative who has indicated his/her understanding and acceptance.     Plan Discussed with: Anesthesiologist, CRNA and Surgeon  Anesthesia Plan Comments:         Anesthesia Quick Evaluation

## 2016-12-03 NOTE — Anesthesia Procedure Notes (Signed)
Spinal  Patient location during procedure: OR Start time: 12/03/2016 11:45 AM End time: 12/03/2016 11:51 AM Staffing Anesthesiologist: Josephine Igo Resident/CRNA: Katheline Brendlinger, Nadara Mustard G Performed: resident/CRNA  Preanesthetic Checklist Completed: patient identified, site marked, surgical consent, pre-op evaluation, timeout performed, IV checked, risks and benefits discussed and monitors and equipment checked Spinal Block Patient position: sitting Prep: DuraPrep Patient monitoring: heart rate, continuous pulse ox and blood pressure Approach: midline Location: L2-3 Injection technique: single-shot Needle Needle type: Sprotte  Needle gauge: 24 G Needle length: 10 cm Needle insertion depth: 7 cm Assessment Sensory level: T6 Additional Notes Kit expiration date verified, -heme, -paraesthesia, +CSF,  Patient tolerated well.

## 2016-12-03 NOTE — Transfer of Care (Signed)
Immediate Anesthesia Transfer of Care Note  Patient: Maria [name]  Procedure(s) Performed: Procedure(s): RIGHT TOTAL KNEE ARTHROPLASTY (Right)  Patient Location: PACU  Anesthesia Type:MAC and Spinal  Level of Consciousness: awake, alert  and oriented  Airway & Oxygen Therapy: Patient Spontanous Breathing and Patient connected to nasal cannula oxygen  Post-op Assessment: Report given to RN and Post -op Vital signs reviewed and stable  Post vital signs: Reviewed and stable  Last Vitals:  Vitals:   12/03/16 1129 12/03/16 1130  BP:    Pulse: 72 81  Resp: (!) 7 16    Last Pain:  Vitals:   12/03/16 1020  PainSc: 6       Patients Stated Pain Goal: 4 (79/55/83 1674)  Complications: No apparent anesthesia complications

## 2016-12-04 LAB — CBC
HEMATOCRIT: 34.5 % — AB (ref 36.0–46.0)
Hemoglobin: 11.3 g/dL — ABNORMAL LOW (ref 12.0–15.0)
MCH: 27.5 pg (ref 26.0–34.0)
MCHC: 32.8 g/dL (ref 30.0–36.0)
MCV: 83.9 fL (ref 78.0–100.0)
PLATELETS: 130 10*3/uL — AB (ref 150–400)
RBC: 4.11 MIL/uL (ref 3.87–5.11)
RDW: 14.1 % (ref 11.5–15.5)
WBC: 10.4 10*3/uL (ref 4.0–10.5)

## 2016-12-04 LAB — BASIC METABOLIC PANEL
ANION GAP: 4 — AB (ref 5–15)
BUN: 13 mg/dL (ref 6–20)
CALCIUM: 8.5 mg/dL — AB (ref 8.9–10.3)
CO2: 27 mmol/L (ref 22–32)
Chloride: 104 mmol/L (ref 101–111)
Creatinine, Ser: 0.7 mg/dL (ref 0.44–1.00)
GLUCOSE: 203 mg/dL — AB (ref 65–99)
POTASSIUM: 4 mmol/L (ref 3.5–5.1)
Sodium: 135 mmol/L (ref 135–145)

## 2016-12-04 LAB — GLUCOSE, CAPILLARY: Glucose-Capillary: 126 mg/dL — ABNORMAL HIGH (ref 65–99)

## 2016-12-04 MED ORDER — TRAMADOL HCL 50 MG PO TABS: 50.0000 mg | ORAL_TABLET | Freq: Four times a day (QID) | ORAL | 0 refills | Status: DC | PRN

## 2016-12-04 MED ORDER — HYDROMORPHONE HCL 2 MG PO TABS: 2.0000 mg | ORAL_TABLET | ORAL | 0 refills | Status: DC | PRN

## 2016-12-04 MED ORDER — METHOCARBAMOL 500 MG PO TABS: 500.0000 mg | ORAL_TABLET | Freq: Four times a day (QID) | ORAL | 0 refills | Status: DC | PRN

## 2016-12-04 MED ORDER — HYDROMORPHONE HCL 2 MG PO TABS
2.0000 mg | ORAL_TABLET | ORAL | Status: DC | PRN
Start: 2016-12-04 — End: 2016-12-05
  Administered 2016-12-04 (×2): 2 mg via ORAL
  Administered 2016-12-04: 23:00:00 4 mg via ORAL
  Administered 2016-12-04: 2 mg via ORAL
  Administered 2016-12-05 (×2): 4 mg via ORAL
  Filled 2016-12-04 (×3): qty 1
  Filled 2016-12-04: qty 2
  Filled 2016-12-04 (×2): qty 1
  Filled 2016-12-04: qty 2

## 2016-12-04 MED ORDER — RIVAROXABAN 10 MG PO TABS: 10.0000 mg | ORAL_TABLET | Freq: Every day | ORAL | 0 refills | Status: DC

## 2016-12-04 NOTE — Evaluation (Signed)
Occupational Therapy Evaluation Patient Details Name: Maria [name] MRN: [id number] DOB: February 09, 1951 Today's Date: 12/04/2016    History of Present Illness s/p R TKA   Clinical Impression   Pt is s/p TKA resulting in the deficits listed below (see OT Problem List).  Pt will benefit from skilled OT to increase their safety and independence with ADL and functional mobility for ADL to facilitate discharge to venue listed below.        Follow Up Recommendations  No OT follow up    Equipment Recommendations  None recommended by OT       Precautions / Restrictions Precautions Precautions: Fall;Knee Required Braces or Orthoses: Knee Immobilizer - Right Knee Immobilizer - Right: Discontinue once straight leg raise with < 10 degree lag Restrictions Weight Bearing Restrictions: No Other Position/Activity Restrictions: WBAT      Mobility Bed Mobility Overal bed mobility: Needs Assistance Bed Mobility: Supine to Sit     Supine to sit: Min assist     General bed mobility comments: VC for safety and technique  Transfers Overall transfer level: Needs assistance Equipment used: Rolling walker (2 wheeled) Transfers: Sit to/from [facility] Sit to Stand: Min assist Stand pivot transfers: Min assist       General transfer comment: VC for technique        ADL either performed or assessed with clinical judgement   ADL Overall ADL's : Needs assistance/impaired Eating/Feeding: Set up   Grooming: Set up;Sitting   Upper Body Bathing: Set up;Sitting   Lower Body Bathing: Moderate assistance;Sit to/from stand;Cueing for safety;Cueing for sequencing;Cueing for compensatory techniques   Upper Body Dressing : Set up;Sitting   Lower Body Dressing: Moderate assistance;Sit to/from stand;Cueing for safety;Cueing for sequencing   Toilet Transfer: Moderate assistance;Ambulation;Cueing for sequencing;Cueing for safety;BSC   Toileting- Clothing Manipulation and  Hygiene: Moderate assistance;Sit to/from stand;Cueing for safety;Cueing for sequencing         General ADL Comments: Pt was nauseous during OT session. Pt did agree to get to chair/ Nursing student present and aware     Vision Patient Visual Report: No change from baseline              Pertinent Vitals/Pain Pain Assessment: 0-10 Pain Score: 5  Pain Location: r knee Pain Descriptors / Indicators: Sore Pain Intervention(s): Monitored during session     Hand Dominance     Extremity/Trunk Assessment Upper Extremity Assessment Upper Extremity Assessment: Overall WFL for tasks assessed           Communication Communication Communication: No difficulties   Cognition Arousal/Alertness: Awake/alert Behavior During Therapy: WFL for tasks assessed/performed Overall Cognitive Status: Within Functional Limits for tasks assessed                                     General Comments    Pt limited by nausea this day.           Home Living Family/patient expects to be discharged to:: Private residence Living Arrangements: Alone Available Help at Discharge: Family Type of Home: House Home Access: Stairs to enter [employer] of Steps: 1   Home Layout: Full bath on main level     Bathroom Shower/Tub: Tub/shower unit;Walk-in shower   Bathroom Toilet: Handicapped height     Home Equipment: None          Prior Functioning/Environment Level of Independence: Independent  OT Problem List: Decreased strength;Decreased activity tolerance;Decreased knowledge of use of DME or AE      OT Treatment/Interventions: Self-care/ADL training;DME and/or AE instruction;Patient/family education    OT Goals(Current goals can be found in the care plan section) Acute Rehab OT Goals Patient Stated Goal: feel better than this OT Goal Formulation: With patient Time For Goal Achievement: 12/11/16 Potential to Achieve Goals: Good  OT  Frequency: Min 2X/week   Barriers to D/C:               End of Session Equipment Utilized During Treatment: Rolling walker CPM Right Knee CPM Right Knee: Off Nurse Communication: Mobility status  Activity Tolerance: Patient tolerated treatment well Patient left: in chair;with call bell/phone within reach  OT Visit Diagnosis: Unsteadiness on feet (R26.81)                Time: 5102-5852 OT Time Calculation (min): 28 min Charges:  OT General Charges $OT Visit: 1 Procedure OT Evaluation $OT Eval Low Complexity: 1 Procedure OT Treatments $Self Care/Home Management : 8-22 mins G-Codes:     Kari Baars, OT [phone]  Payton Mccallum D 12/04/2016, 10:44 AM

## 2016-12-04 NOTE — Discharge Instructions (Addendum)
° °Dr. Frank Aluisio °Total Joint Specialist °[facility] °[address] °[place], [place] [address] °[phone] ° °TOTAL KNEE REPLACEMENT POSTOPERATIVE DIRECTIONS ° °Knee Rehabilitation, Guidelines Following Surgery  °Results after knee surgery are often greatly improved when you follow the exercise, range of motion and muscle strengthening exercises prescribed by your doctor. Safety measures are also important to protect the knee from further injury. Any time any of these exercises cause you to have increased pain or swelling in your knee joint, decrease the amount until you are comfortable again and slowly increase them. If you have problems or questions, call your caregiver or physical therapist for advice.  ° °HOME CARE INSTRUCTIONS  °Remove items at home which could result in a fall. This includes throw rugs or furniture in walking pathways.  °· ICE to the affected knee every three hours for 30 minutes at a time and then as needed for pain and swelling.  Continue to use ice on the knee for pain and swelling from surgery. You may notice swelling that will progress down to the foot and ankle.  This is normal after surgery.  Elevate the leg when you are not up walking on it.   °· Continue to use the breathing machine which will help keep your temperature down.  It is common for your temperature to cycle up and down following surgery, especially at night when you are not up moving around and exerting yourself.  The breathing machine keeps your lungs expanded and your temperature down. °· Do not place pillow under knee, focus on keeping the knee straight while resting ° °DIET °You may resume your previous home diet once your are discharged from the hospital. ° °DRESSING / WOUND CARE / SHOWERING °You may shower 3 days after surgery, but keep the wounds dry during showering.  You may use an occlusive plastic wrap (Press'n Seal for example), NO SOAKING/SUBMERGING IN THE BATHTUB.  If the  bandage gets wet, change with a clean dry gauze.  If the incision gets wet, pat the wound dry with a clean towel. °You may start showering once you are discharged home but do not submerge the incision under water. Just pat the incision dry and apply a dry gauze dressing on daily. °Change the surgical dressing daily and reapply a dry dressing each time. ° °ACTIVITY °Walk with your walker as instructed. °Use walker as long as suggested by your caregivers. °Avoid periods of inactivity such as sitting longer than an hour when not asleep. This helps prevent blood clots.  °You may resume a sexual relationship in one month or when given the OK by your doctor.  °You may return to work once you are cleared by your doctor.  °Do not drive a car for 6 weeks or until released by you surgeon.  °Do not drive while taking narcotics. ° °WEIGHT BEARING °Weight bearing as tolerated with assist device (walker, cane, etc) as directed, use it as long as suggested by your surgeon or therapist, typically at least 4-6 weeks. ° °POSTOPERATIVE CONSTIPATION PROTOCOL °Constipation - defined medically as fewer than three stools per week and severe constipation as less than one stool per week. ° °One of the most common issues patients have following surgery is constipation.  Even if you have a regular bowel pattern at home, your normal regimen is likely to be disrupted due to multiple reasons following surgery.  Combination of anesthesia, postoperative narcotics, change in appetite and fluid intake all can affect your bowels.    In order to avoid complications following surgery, here are some recommendations in order to help you during your recovery period. ° °Colace (docusate) - Pick up an over-the-counter form of Colace or another stool softener and take twice a day as long as you are requiring postoperative pain medications.  Take with a full glass of water daily.  If you experience loose stools or diarrhea, hold the colace until you stool forms  back up.  If your symptoms do not get better within 1 week or if they get worse, check with your doctor. ° °Dulcolax (bisacodyl) - Pick up over-the-counter and take as directed by the product packaging as needed to assist with the movement of your bowels.  Take with a full glass of water.  Use this product as needed if not relieved by Colace only.  ° °MiraLax (polyethylene glycol) - Pick up over-the-counter to have on hand.  MiraLax is a solution that will increase the amount of water in your bowels to assist with bowel movements.  Take as directed and can mix with a glass of water, juice, soda, coffee, or tea.  Take if you go more than two days without a movement. °Do not use MiraLax more than once per day. Call your doctor if you are still constipated or irregular after using this medication for 7 days in a row. ° °If you continue to have problems with postoperative constipation, please contact the office for further assistance and recommendations.  If you experience "the worst abdominal pain ever" or develop nausea or vomiting, please contact the office immediatly for further recommendations for treatment. ° °ITCHING ° If you experience itching with your medications, try taking only a single pain pill, or even half a pain pill at a time.  You can also use Benadryl over the counter for itching or also to help with sleep.  ° °TED HOSE STOCKINGS °Wear the elastic stockings on both legs for three weeks following surgery during the day but you may remove then at night for sleeping. ° °MEDICATIONS °See your medication summary on the “After Visit Summary” that the nursing staff will review with you prior to discharge.  You may have some home medications which will be placed on hold until you complete the course of blood thinner medication.  It is important for you to complete the blood thinner medication as prescribed by your surgeon.  Continue your approved medications as instructed at time of  discharge. ° °PRECAUTIONS °If you experience chest pain or shortness of breath - call 911 immediately for transfer to the hospital emergency department.  °If you develop a fever greater that 101 F, purulent drainage from wound, increased redness or drainage from wound, foul odor from the wound/dressing, or calf pain - CONTACT YOUR SURGEON.   °                                                °FOLLOW-UP APPOINTMENTS °Make sure you keep all of your appointments after your operation with your surgeon and caregivers. You should call the office at the above phone number and make an appointment for approximately two weeks after the date of your surgery or on the date instructed by your surgeon outlined in the "After Visit Summary". ° ° °RANGE OF MOTION AND STRENGTHENING EXERCISES  °Rehabilitation of the knee is important following a knee injury or   an operation. After just a few days of immobilization, the muscles of the thigh which control the knee become weakened and shrink (atrophy). Knee exercises are designed to build up the tone and strength of the thigh muscles and to improve knee motion. Often times heat used for twenty to thirty minutes before working out will loosen up your tissues and help with improving the range of motion but do not use heat for the first two weeks following surgery. These exercises can be done on a training (exercise) mat, on the floor, on a table or on a bed. Use what ever works the best and is most comfortable for you Knee exercises include:  °Leg Lifts - While your knee is still immobilized in a splint or cast, you can do straight leg raises. Lift the leg to 60 degrees, hold for 3 sec, and slowly lower the leg. Repeat 10-20 times 2-3 times daily. Perform this exercise against resistance later as your knee gets better.  °Quad and Hamstring Sets - Tighten up the muscle on the front of the thigh (Quad) and hold for 5-10 sec. Repeat this 10-20 times hourly. Hamstring sets are done by pushing the  foot backward against an object and holding for 5-10 sec. Repeat as with quad sets.  °· Leg Slides: Lying on your back, slowly slide your foot toward your buttocks, bending your knee up off the floor (only go as far as is comfortable). Then slowly slide your foot back down until your leg is flat on the floor again. °· Angel Wings: Lying on your back spread your legs to the side as far apart as you can without causing discomfort.  °A rehabilitation program following serious knee injuries can speed recovery and prevent re-injury in the future due to weakened muscles. Contact your doctor or a physical therapist for more information on knee rehabilitation.  ° °IF YOU ARE TRANSFERRED TO A SKILLED REHAB FACILITY °If the patient is transferred to a skilled rehab facility following release from the hospital, a list of the current medications will be sent to the facility for the patient to continue.  When discharged from the skilled rehab facility, please have the facility set up the patient's Home Health Physical Therapy prior to being released. Also, the skilled facility will be responsible for providing the patient with their medications at time of release from the facility to include their pain medication, the muscle relaxants, and their blood thinner medication. If the patient is still at the rehab facility at time of the two week follow up appointment, the skilled rehab facility will also need to assist the patient in arranging follow up appointment in our office and any transportation needs. ° °MAKE SURE YOU:  °Understand these instructions.  °Get help right away if you are not doing well or get worse.  ° ° °Pick up stool softner and laxative for home use following surgery while on pain medications. °Do not submerge incision under water. °Please use good hand washing techniques while changing dressing each day. °May shower starting three days after surgery. °Please use a clean towel to pat the incision dry following  showers. °Continue to use ice for pain and swelling after surgery. °Do not use any lotions or creams on the incision until instructed by your surgeon. ° °Take Xarelto for two and a half more weeks following discharge from the hospital, then discontinue Xarelto. °Once the patient has completed the blood thinner regimen, then take a Baby 81 mg Aspirin daily for three   more weeks.    Information on my medicine - XARELTO (Rivaroxaban)  This medication education was reviewed with me or my healthcare representative as part of my discharge preparation.  The pharmacist that spoke with me during my hospital stay was:  Tommie Raymond, Student-PharmD  Why was Xarelto prescribed for you? Xarelto was prescribed for you to reduce the risk of blood clots forming after orthopedic surgery. The medical term for these abnormal blood clots is venous thromboembolism (VTE).  What do you need to know about xarelto ? Take your Xarelto ONCE DAILY at the same time every day. You may take it either with or without food.  If you have difficulty swallowing the tablet whole, you may crush it and mix in applesauce just prior to taking your dose.  Take Xarelto exactly as prescribed by your doctor and DO NOT stop taking Xarelto without talking to the doctor who prescribed the medication.  Stopping without other VTE prevention medication to take the place of Xarelto may increase your risk of developing a clot.  After discharge, you should have regular check-up appointments with your healthcare provider that is prescribing your Xarelto.    What do you do if you miss a dose? If you miss a dose, take it as soon as you remember on the same day then continue your regularly scheduled once daily regimen the next day. Do not take two doses of Xarelto on the same day.   Important Safety Information A possible side effect of Xarelto is bleeding. You should call your healthcare provider right away if you experience any of the  following: ? Bleeding from an injury or your nose that does not stop. ? Unusual colored urine (red or dark brown) or unusual colored stools (red or black). ? Unusual bruising for unknown reasons. ? A serious fall or if you hit your head (even if there is no bleeding).  Some medicines may interact with Xarelto and might increase your risk of bleeding while on Xarelto. To help avoid this, consult your healthcare provider or pharmacist prior to using any new prescription or non-prescription medications, including herbals, vitamins, non-steroidal anti-inflammatory drugs (NSAIDs) and supplements.  This website has more information on Xarelto: [email or web address]

## 2016-12-04 NOTE — Evaluation (Signed)
Physical Therapy Evaluation Patient Details Name: Maria [name] MRN: [id number] DOB: December 19, 1950 Today's Date: 12/04/2016   History of Present Illness  s/p R TKA  Clinical Impression  Pt is s/p TKA resulting in the deficits listed below (see PT Problem List). * Pt will benefit from skilled PT to increase their independence and safety with mobility to allow discharge to the venue listed below.      Follow Up Recommendations Outpatient PT (per MD)    Equipment Recommendations  Rolling walker with 5" wheels    Recommendations for Other Services       Precautions / Restrictions Precautions Precautions: Fall;Knee Precaution Comments: IND SLRs, KI not used Required Braces or Orthoses: Knee Immobilizer - Right Knee Immobilizer - Right: Discontinue once straight leg raise with < 10 degree lag Restrictions Weight Bearing Restrictions: No Other Position/Activity Restrictions: WBAT      Mobility  Bed Mobility Overal bed mobility:  (OOB with OT, mobility deferred at this time d/t nausea) Bed Mobility: Supine to Sit     Supine to sit: Min assist     General bed mobility comments: VC for safety and technique  Transfers Overall transfer level: Needs assistance Equipment used: Rolling walker (2 wheeled) Transfers: Sit to/from [facility] Sit to Stand: Min assist Stand pivot transfers: Min assist       General transfer comment: VC for technique  Ambulation/Gait                Stairs            Wheelchair Mobility    Modified Rankin (Stroke Patients Only)       Balance                                             Pertinent Vitals/Pain Pain Assessment: 0-10 Pain Score: 5  Pain Location: r knee Pain Descriptors / Indicators: Sore;Aching Pain Intervention(s): Limited activity within patient's tolerance;Monitored during session;Repositioned;Ice applied    Home Living Family/patient expects to be discharged to::  Private residence Living Arrangements: Alone Available Help at Discharge: Family Type of Home: House Home Access: Stairs to enter   Technical brewer of Steps: 1 Home Layout: Full bath on main level Home Equipment: None      Prior Function Level of Independence: Independent               Hand Dominance        Extremity/Trunk Assessment   Upper Extremity Assessment Upper Extremity Assessment: Defer to OT evaluation;Overall WFL for tasks assessed    Lower Extremity Assessment Lower Extremity Assessment: RLE deficits/detail RLE Deficits / Details: ankle WFL; knee extension and hip flexion 3/5; AAROM knee flexion grossly 5 to 55*       Communication   Communication: No difficulties  Cognition Arousal/Alertness: Awake/alert Behavior During Therapy: WFL for tasks assessed/performed Overall Cognitive Status: Within Functional Limits for tasks assessed                                        General Comments      Exercises Total Joint Exercises Ankle Circles/Pumps: AROM;Both Quad Sets: AROM;Both Heel Slides: AAROM;10 reps;Right Hip ABduction/ADduction: 10 reps;AAROM Straight Leg Raises: AAROM;10 reps   Assessment/Plan    PT Assessment Patient needs continued PT services  PT Problem List Decreased strength;Decreased range of motion;Decreased mobility;Decreased activity tolerance;Decreased knowledge of use of DME;Pain       PT Treatment Interventions DME instruction;Gait training;Functional mobility training;Therapeutic activities;Therapeutic exercise;Stair training;Patient/family education    PT Goals (Current goals can be found in the Care Plan section)  Acute Rehab PT Goals Patient Stated Goal: feel better than this Time For Goal Achievement: 12/07/16 Potential to Achieve Goals: Good    Frequency 7X/week   Barriers to discharge        Co-evaluation               End of Session   Activity Tolerance: Other (comment)  (limited by nausea) Patient left: in chair;with call bell/phone within reach;with chair alarm set;with family/visitor present   PT Visit Diagnosis: Difficulty in walking, not elsewhere classified (R26.2)    Time: 2563-8937 PT Time Calculation (min) (ACUTE ONLY): 30 min   Charges:   PT Evaluation $PT Eval Low Complexity: 1 Procedure PT Treatments $Therapeutic Exercise: 8-22 mins   PT G Codes:          Maria [name] 12/16/2016, 12:07 PM

## 2016-12-04 NOTE — Discharge Summary (Signed)
Physician Discharge Summary   Patient ID: Maria [name] MRN: [id number] DOB/AGE: 17-Nov-1950 66 y.o.  Admit date: 12/03/2016  Discharge date: 12-05-2016  Primary Diagnosis:  Osteoarthritis  Right knee(s)  Admission Diagnoses:  Past Medical History:  Diagnosis Date  . Allergy   . Arthritis   . Asthma   . Cancer [facility])    chronic lymphacytic leukemia  . Diabetes mellitus   . Hyperlipidemia   . Hypertension   . Hypothyroidism   . Thyroid disease    hypothyroidism   Discharge Diagnoses:   Principal Problem:   OA (osteoarthritis) of knee  Estimated body mass index is 32.28 kg/m as calculated from the following:   Height as of this encounter: '5\' 6"'$  (1.676 m).   Weight as of this encounter: 90.7 kg (200 lb).  Procedure:  Procedure(s) (LRB): RIGHT TOTAL KNEE ARTHROPLASTY (Right)   Consults: None  HPI: Maria [name] is a 66 y.o. year old female with end stage OA of her right knee with progressively worsening pain and dysfunction. She has constant pain, with activity and at rest and significant functional deficits with difficulties even with ADLs. She has had extensive non-op management including analgesics, injections of cortisone and viscosupplements, and home exercise program, but remains in significant pain with significant dysfunction.Radiographs show bone on bone arthritis lateral and patellofemoral. She presents now for right Total Knee Arthroplasty.    Laboratory Data: Admission on 12/03/2016  Component Date Value Ref Range Status  . Glucose-Capillary 12/03/2016 139* 65 - 99 mg/dL Final  . Comment 1 12/03/2016 Notify RN   Final  . Glucose-Capillary 12/03/2016 134* 65 - 99 mg/dL Final  . Comment 1 12/03/2016 Document in Chart   Final  . Glucose-Capillary 12/03/2016 155* 65 - 99 mg/dL Final  . WBC 12/04/2016 10.4  4.0 - 10.5 K/uL Final  . RBC 12/04/2016 4.11  3.87 - 5.11 MIL/uL Final  . Hemoglobin 12/04/2016 11.3* 12.0 - 15.0 g/dL Final  . HCT 12/04/2016 34.5*  36.0 - 46.0 % Final  . MCV 12/04/2016 83.9  78.0 - 100.0 fL Final  . MCH 12/04/2016 27.5  26.0 - 34.0 pg Final  . MCHC 12/04/2016 32.8  30.0 - 36.0 g/dL Final  . RDW 12/04/2016 14.1  11.5 - 15.5 % Final  . Platelets 12/04/2016 130* 150 - 400 K/uL Final  . Sodium 12/04/2016 135  135 - 145 mmol/L Final  . Potassium 12/04/2016 4.0  3.5 - 5.1 mmol/L Final  . Chloride 12/04/2016 104  101 - 111 mmol/L Final  . CO2 12/04/2016 27  22 - 32 mmol/L Final  . Glucose, Bld 12/04/2016 203* 65 - 99 mg/dL Final  . BUN 12/04/2016 13  6 - 20 mg/dL Final  . Creatinine, Ser 12/04/2016 0.70  0.44 - 1.00 mg/dL Final  . Calcium 12/04/2016 8.5* 8.9 - 10.3 mg/dL Final  . GFR calc non Af Amer 12/04/2016 >60  >60 mL/min Final  . GFR calc Af Amer 12/04/2016 >60  >60 mL/min Final   Comment: (NOTE) The eGFR has been calculated using the CKD EPI equation. This calculation has not been validated in all clinical situations. eGFR's persistently <60 mL/min signify possible Chronic Kidney Disease.   . Anion gap 12/04/2016 4* 5 - 15 Final  . Glucose-Capillary 12/03/2016 180* 65 - 99 mg/dL Final  . Glucose-Capillary 12/04/2016 126* 65 - 99 mg/dL Final  Hospital Outpatient Visit on 11/28/2016  Component Date Value Ref Range Status  . Glucose-Capillary 11/28/2016 116* 65 - 99 mg/dL Final  .  aPTT 11/28/2016 27  24 - 36 seconds Final  . WBC 11/28/2016 5.2  4.0 - 10.5 K/uL Final  . RBC 11/28/2016 5.14* 3.87 - 5.11 MIL/uL Final  . Hemoglobin 11/28/2016 14.2  12.0 - 15.0 g/dL Final  . HCT 65/19/0717 43.2  36.0 - 46.0 % Final  . MCV 11/28/2016 84.0  78.0 - 100.0 fL Final  . MCH 11/28/2016 27.6  26.0 - 34.0 pg Final  . MCHC 11/28/2016 32.9  30.0 - 36.0 g/dL Final  . RDW 26/91/3640 14.2  11.5 - 15.5 % Final  . Platelets 11/28/2016 139* 150 - 400 K/uL Final  . Sodium 11/28/2016 136  135 - 145 mmol/L Final  . Potassium 11/28/2016 3.9  3.5 - 5.1 mmol/L Final  . Chloride 11/28/2016 102  101 - 111 mmol/L Final  . CO2 11/28/2016  28  22 - 32 mmol/L Final  . Glucose, Bld 11/28/2016 124* 65 - 99 mg/dL Final  . BUN 62/55/2984 16  6 - 20 mg/dL Final  . Creatinine, Ser 11/28/2016 0.93  0.44 - 1.00 mg/dL Final  . Calcium 71/15/6596 9.4  8.9 - 10.3 mg/dL Final  . Total Protein 11/28/2016 6.6  6.5 - 8.1 g/dL Final  . Albumin 44/36/6728 4.1  3.5 - 5.0 g/dL Final  . AST 62/60/0487 43* 15 - 41 U/L Final  . ALT 11/28/2016 60* 14 - 54 U/L Final  . Alkaline Phosphatase 11/28/2016 111  38 - 126 U/L Final  . Total Bilirubin 11/28/2016 0.9  0.3 - 1.2 mg/dL Final  . GFR calc non Af Amer 11/28/2016 >60  >60 mL/min Final  . GFR calc Af Amer 11/28/2016 >60  >60 mL/min Final   Comment: (NOTE) The eGFR has been calculated using the CKD EPI equation. This calculation has not been validated in all clinical situations. eGFR's persistently <60 mL/min signify possible Chronic Kidney Disease.   . Anion gap 11/28/2016 6  5 - 15 Final  . Prothrombin Time 11/28/2016 12.5  11.4 - 15.2 seconds Final  . INR 11/28/2016 0.94   Final  . ABO/RH(D) 11/28/2016 A POS   Final  . Antibody Screen 11/28/2016 NEG   Final  . Sample Expiration 11/28/2016 12/06/2016   Final  . Extend sample reason 11/28/2016 NO TRANSFUSIONS OR PREGNANCY IN THE PAST 3 MONTHS   Final  . MRSA, PCR 11/28/2016 NEGATIVE  NEGATIVE Final  . Staphylococcus aureus 11/28/2016 NEGATIVE  NEGATIVE Final   Comment:        The Xpert SA Assay (FDA approved for NASAL specimens in patients over 1 years of age), is one component of a comprehensive surveillance program.  Test performance has been validated by [facility] for patients greater than or equal to 70 year old. It is not intended to diagnose infection nor to guide or monitor treatment.   . Hgb A1c MFr Bld 11/28/2016 6.2* 4.8 - 5.6 % Final   Comment: (NOTE)         Pre-diabetes: 5.7 - 6.4         Diabetes: >6.4         Glycemic control for adults with diabetes: <7.0   . Mean Plasma Glucose 11/28/2016 131  mg/dL Final    Comment: (NOTE) Performed At: [facility] [address] [place], [place] [id number] Mila Homer MD WD:[id number]   . ABO/RH(D) 11/28/2016 A POS   Final     X-Rays:No results found.  EKG: Orders placed or performed in visit on 07/29/09  . Converted CEMR  EKG     Hospital Course: Maria [name] is a 66 y.o. who was admitted to [facility]. They were brought to the operating room on 12/03/2016 and underwent Procedure(s): RIGHT TOTAL KNEE ARTHROPLASTY.  Patient tolerated the procedure well and was later transferred to the recovery room and then to the orthopaedic floor for postoperative care.  They were given PO and IV analgesics for pain control following their surgery.  They were given 24 hours of postoperative antibiotics of  Anti-infectives    Start     Dose/Rate Route Frequency Ordered Stop   12/03/16 2330  vancomycin (VANCOCIN) IVPB 1000 mg/200 mL premix     1,000 mg 200 mL/hr over 60 Minutes Intravenous Every 12 hours 12/03/16 1456 12/04/16 0055   12/03/16 1006  vancomycin (VANCOCIN) 1-5 GM/200ML-% IVPB    Comments:  Mardelle Matte   : cabinet override      12/03/16 1006 12/03/16 2214   12/03/16 0953  vancomycin (VANCOCIN) IVPB 1000 mg/200 mL premix     1,000 mg 200 mL/hr over 60 Minutes Intravenous On call to O.R. 12/03/16 6237 12/03/16 1205     and started on DVT prophylaxis in the form of Xarelto.   PT and OT were ordered for total joint protocol.  Discharge planning consulted to help with postop disposition and equipment needs.  Patient had a tough night on the evening of surgery.  They started to get up OOB with therapy on day one. Hemovac drain was pulled without difficulty.  Continued to work with therapy into day two.  Dressing was changed on day two and the incision was healing well.  Patient was seen in rounds on day two, feeling better and was ready to go home.  Discharge home - straight to outpatient at [facility] and Diabetic  diet Follow up - in 2 weeks Activity - WBAT Disposition - Home Condition Upon Discharge - Good D/C Meds - See DC Summary DVT Prophylaxis - Xarelto   Discharge Instructions    Call MD / Call 911    Complete by:  As directed    If you experience chest pain or shortness of breath, CALL 911 and be transported to the hospital emergency room.  If you develope a fever above 101 F, pus (white drainage) or increased drainage or redness at the wound, or calf pain, call your surgeon's office.   Change dressing    Complete by:  As directed    Change dressing daily with sterile 4 x 4 inch gauze dressing and apply TED hose. Do not submerge the incision under water.   Constipation Prevention    Complete by:  As directed    Drink plenty of fluids.  Prune juice may be helpful.  You may use a stool softener, such as Colace (over the counter) 100 mg twice a day.  Use MiraLax (over the counter) for constipation as needed.   Diet - low sodium heart healthy    Complete by:  As directed    Diet Carb Modified    Complete by:  As directed    Discharge instructions    Complete by:  As directed    Pick up stool softner and laxative for home use following surgery while on pain medications. Do not submerge incision under water. Please use good hand washing techniques while changing dressing each day. May shower starting three days after surgery. Please use a clean towel to pat the incision dry following showers. Continue to  use ice for pain and swelling after surgery. Do not use any lotions or creams on the incision until instructed by your surgeon.  Wear both TED hose on both legs during the day every day for three weeks, but may have off at night at home.  Postoperative Constipation Protocol  Constipation - defined medically as fewer than three stools per week and severe constipation as less than one stool per week.  One of the most common issues patients have following surgery is constipation.  Even if  you have a regular bowel pattern at home, your normal regimen is likely to be disrupted due to multiple reasons following surgery.  Combination of anesthesia, postoperative narcotics, change in appetite and fluid intake all can affect your bowels.  In order to avoid complications following surgery, here are some recommendations in order to help you during your recovery period.  Colace (docusate) - Pick up an over-the-counter form of Colace or another stool softener and take twice a day as long as you are requiring postoperative pain medications.  Take with a full glass of water daily.  If you experience loose stools or diarrhea, hold the colace until you stool forms back up.  If your symptoms do not get better within 1 week or if they get worse, check with your doctor.  Dulcolax (bisacodyl) - Pick up over-the-counter and take as directed by the product packaging as needed to assist with the movement of your bowels.  Take with a full glass of water.  Use this product as needed if not relieved by Colace only.   MiraLax (polyethylene glycol) - Pick up over-the-counter to have on hand.  MiraLax is a solution that will increase the amount of water in your bowels to assist with bowel movements.  Take as directed and can mix with a glass of water, juice, soda, coffee, or tea.  Take if you go more than two days without a movement. Do not use MiraLax more than once per day. Call your doctor if you are still constipated or irregular after using this medication for 7 days in a row.  If you continue to have problems with postoperative constipation, please contact the office for further assistance and recommendations.  If you experience "the worst abdominal pain ever" or develop nausea or vomiting, please contact the office immediatly for further recommendations for treatment.   Take Xarelto for two and a half more weeks, then discontinue Xarelto. Once the patient has completed the blood thinner regimen, then take a  Baby 81 mg Aspirin daily for three more weeks.   Do not put a pillow under the knee. Place it under the heel.    Complete by:  As directed    Do not sit on low chairs, stoools or toilet seats, as it may be difficult to get up from low surfaces    Complete by:  As directed    Driving restrictions    Complete by:  As directed    No driving until released by the physician.   Increase activity slowly as tolerated    Complete by:  As directed    Lifting restrictions    Complete by:  As directed    No lifting until released by the physician.   Patient may shower    Complete by:  As directed    You may shower without a dressing once there is no drainage.  Do not wash over the wound.  If drainage remains, do not shower until drainage stops.  TED hose    Complete by:  As directed    Use stockings (TED hose) for 3 weeks on both leg(s).  You may remove them at night for sleeping.   Weight bearing as tolerated    Complete by:  As directed    Laterality:  right   Extremity:  Lower     Allergies as of 12/04/2016      Reactions   Cephalexin    REACTION: Rash   Lactose Intolerance (gi)    Diarrhea, gas bloating   Iohexol Swelling, Rash      Medication List    STOP taking these medications   celecoxib 200 MG capsule Commonly known as:  CELEBREX   Vitamin D (Ergocalciferol) 50000 units Caps capsule Commonly known as:  DRISDOL   Vitamin D-3 5000 units Tabs     TAKE these medications   hydrochlorothiazide 12.5 MG tablet Commonly known as:  HYDRODIURIL Take 12.5 mg by mouth every morning.   HYDROmorphone 2 MG tablet Commonly known as:  DILAUDID Take 1-2 tablets (2-4 mg total) by mouth every 4 (four) hours as needed for moderate pain or severe pain.   Ibrutinib 420 MG Tabs Commonly known as:  IMBRUVICA Take 1 tablet by mouth daily. What changed:  when to take this   LORazepam 1 MG tablet Commonly known as:  ATIVAN Take 1 mg by mouth daily as needed for anxiety.     methocarbamol 500 MG tablet Commonly known as:  ROBAXIN Take 1 tablet (500 mg total) by mouth every 6 (six) hours as needed for muscle spasms.   rivaroxaban 10 MG Tabs tablet Commonly known as:  XARELTO Take 1 tablet (10 mg total) by mouth daily with breakfast. Take Xarelto for two and a half more weeks following discharge from the hospital, then discontinue Xarelto. Once the patient has completed the blood thinner regimen, then take a Baby 81 mg Aspirin daily for three more weeks. Start taking on:  12/05/2016   SYNTHROID 88 MCG tablet Generic drug:  levothyroxine Take 88 mcg by mouth daily before breakfast.   traMADol 50 MG tablet Commonly known as:  ULTRAM Take 1-2 tablets (50-100 mg total) by mouth every 6 (six) hours as needed for moderate pain.            Durable Medical Equipment        Start     Ordered   12/04/16 1142  For home use only DME Walker rolling  Once    Question:  Patient needs a walker to treat with the following condition  Answer:  OA (osteoarthritis) of knee   12/04/16 [place] Follow up.   Why:  rolling walker Contact information: [address] [place] [address] [phone]        Gearlean Alf, MD. Schedule an appointment as soon as possible for a visit on 12/18/2016.   Specialty:  Orthopedic Surgery Contact information: [address] [place] [address] [phone]           Signed: Arlee Muslim, PA-C Orthopaedic Surgery 12/04/2016, 9:53 PM

## 2016-12-04 NOTE — Care Management Note (Signed)
Case Management Note  Patient Details  Name: Maria [name] MRN: [id number] Date of Birth: Jan 13, 1951  Subjective/Objective:                  RIGHT TOTAL KNEE ARTHROPLASTY (Right) Action/Plan: Discharge planning Expected Discharge Date:                  Expected Discharge Plan:  Home/Self Care  In-House Referral:     Discharge planning Services  CM Consult  Post Acute Care Choice:  NA Choice offered to:  Patient  DME Arranged:  Walker rolling DME Agency:  [place] Arranged:  NA [place] Agency:  NA  Status of Service:  Completed, signed off  If discussed at [place] of Stay Meetings, dates discussed:    Additional Comments: CM met with pt to offer choice of home health agency and pt states she is NOT having any home health as she is going directly to outptPT in [place].  CM confirmed outpt PT with MD.  CM notified [facility] to please deliver the rolling walker to room prior to discharge. No other CM needs were communicated. Dellie Catholic, RN 12/04/2016, 11:43 AM

## 2016-12-04 NOTE — Progress Notes (Signed)
qPhysical Therapy Treatment Patient Details Name: Maria [name] MRN: [id number] DOB: 05-02-1951 Today's Date: 12/04/2016    History of Present Illness s/p R TKA    PT Comments    Pt progressing, feeling better overall this pm;   Follow Up Recommendations  Outpatient PT     Equipment Recommendations  Rolling walker with 5" wheels    Recommendations for Other Services       Precautions / Restrictions Precautions Precautions: Fall;Knee Precaution Comments: IND SLRs, KI not used Required Braces or Orthoses: Knee Immobilizer - Right Knee Immobilizer - Right: Discontinue once straight leg raise with < 10 degree lag Restrictions Weight Bearing Restrictions: No Other Position/Activity Restrictions: WBAT    Mobility  Bed Mobility Overal bed mobility: Needs Assistance Bed Mobility: Supine to Sit;Sit to Supine     Supine to sit: Min assist Sit to supine: Min guard   General bed mobility comments: VC for safety and technique  Transfers Overall transfer level: Needs assistance Equipment used: Rolling walker (2 wheeled) Transfers: Sit to/from Stand Sit to Stand: Min guard         General transfer comment: verbal cues for hand placement and RLE position  Ambulation/Gait Ambulation/Gait assistance: Min guard;Min assist Ambulation Distance (Feet): 40 Feet Assistive device: Rolling walker (2 wheeled) Gait Pattern/deviations: Step-to pattern;Decreased stance time - right     General Gait Details: cues for RW position from self and sequence   Stairs            Wheelchair Mobility    Modified Rankin (Stroke Patients Only)       Balance                                            Cognition Arousal/Alertness: Awake/alert Behavior During Therapy: WFL for tasks assessed/performed Overall Cognitive Status: Within Functional Limits for tasks assessed                                        Exercises Total Joint  Exercises Ankle Circles/Pumps: AROM;Both;10 reps Quad Sets: AROM;Both;5 reps Heel Slides: AAROM;10 reps;Right Hip ABduction/ADduction: 10 reps;AAROM Straight Leg Raises: AAROM;10 reps    General Comments        Pertinent Vitals/Pain Pain Assessment: 0-10 Pain Score: 4  Pain Location: r knee Pain Descriptors / Indicators: Sore;Aching Pain Intervention(s): Limited activity within patient's tolerance;Monitored during session;Premedicated before session    Home Living                      Prior Function            PT Goals (current goals can now be found in the care plan section) Acute Rehab PT Goals Patient Stated Goal: feel better than this Time For Goal Achievement: 12/07/16 Potential to Achieve Goals: Good Progress towards PT goals: Progressing toward goals    Frequency    7X/week      PT Plan Current plan remains appropriate    Co-evaluation             End of Session Equipment Utilized During Treatment: Right knee immobilizer Activity Tolerance: Patient tolerated treatment well Patient left: in bed;with call bell/phone within reach;with family/visitor present Nurse Communication: Mobility status PT Visit Diagnosis: Difficulty in walking, not elsewhere classified (R26.2)  Time: 5806-3868 PT Time Calculation (min) (ACUTE ONLY): 17 min  Charges:  $Gait Training: 8-22 mins $Therapeutic Exercise: 8-22 mins                    G CodesKenyon [name], PT Pager: [phone] 12/04/2016    Maria [name] 12/04/2016, 2:55 PM

## 2016-12-04 NOTE — Progress Notes (Signed)
   Subjective: 1 Day Post-Op Procedure(s) (LRB): RIGHT TOTAL KNEE ARTHROPLASTY (Right) Patient reports pain as mild and moderate.   Patient seen in rounds for Dr. Wynelle Link.  Has been nauseated thru the night. Will change pain medications. Patient is well, but has had some minor complaints of pain in the knee, requiring pain medications We will start therapy today.  Plan is to go home after hospital stay.  Objective: Vital signs in last 24 hours: Temp:  [97.3 F (36.3 C)-98.6 F (37 C)] 98.1 F (36.7 C) (03/27 0549) Pulse Rate:  [69-81] 70 (03/27 0549) Resp:  [7-26] 17 (03/27 0549) BP: (110-159)/(46-76) 133/62 (03/27 0549) SpO2:  [95 %-100 %] 96 % (03/27 0549) Weight:  [90.7 kg (200 lb)] 90.7 kg (200 lb) (03/26 1020)  Intake/Output from previous day:  Intake/Output Summary (Last 24 hours) at 12/04/16 0933 Last data filed at 12/04/16 0700  Gross per 24 hour  Intake             3415 ml  Output             2410 ml  Net             1005 ml    Intake/Output this shift: No intake/output data recorded.  Labs:  Recent Labs  12/04/16 0414  HGB 11.3*    Recent Labs  12/04/16 0414  WBC 10.4  RBC 4.11  HCT 34.5*  PLT 130*    Recent Labs  12/04/16 0414  NA 135  K 4.0  CL 104  CO2 27  BUN 13  CREATININE 0.70  GLUCOSE 203*  CALCIUM 8.5*   No results for input(s): LABPT, INR in the last 72 hours.  EXAM General - Patient is Alert, Appropriate and Oriented Extremity - Neurovascular intact Sensation intact distally Intact pulses distally Dorsiflexion/Plantar flexion intact Dressing - dressing C/D/I Motor Function - intact, moving foot and toes well on exam.  Hemovac pulled without difficulty.  Past Medical History:  Diagnosis Date  . Allergy   . Arthritis   . Asthma   . Cancer [facility])    chronic lymphacytic leukemia  . Diabetes mellitus   . Hyperlipidemia   . Hypertension   . Hypothyroidism   . Thyroid disease    hypothyroidism    Assessment/Plan: 1  Day Post-Op Procedure(s) (LRB): RIGHT TOTAL KNEE ARTHROPLASTY (Right) Principal Problem:   OA (osteoarthritis) of knee  Estimated body mass index is 32.28 kg/m as calculated from the following:   Height as of this encounter: 5\' 6"  (1.676 m).   Weight as of this encounter: 90.7 kg (200 lb). Up with therapy  Plan home and then straight to outpatient therapy at [facility] starting on the 30th.  DVT Prophylaxis - Xarelto Weight-Bearing as tolerated to right leg D/C O2 and Pulse OX and try on Room Air  Arlee Muslim, PA-C Orthopaedic Surgery 12/04/2016, 9:33 AM

## 2016-12-05 LAB — BASIC METABOLIC PANEL
ANION GAP: 4 — AB (ref 5–15)
BUN: 14 mg/dL (ref 6–20)
CHLORIDE: 103 mmol/L (ref 101–111)
CO2: 28 mmol/L (ref 22–32)
Calcium: 8.7 mg/dL — ABNORMAL LOW (ref 8.9–10.3)
Creatinine, Ser: 0.75 mg/dL (ref 0.44–1.00)
GFR calc non Af Amer: >60 mL/min (ref >60)
GLUCOSE: 129 mg/dL — AB (ref 65–99)
POTASSIUM: 3.8 mmol/L (ref 3.5–5.1)
Sodium: 135 mmol/L (ref 135–145)

## 2016-12-05 LAB — CBC
HEMATOCRIT: 32.4 % — AB (ref 36.0–46.0)
HEMOGLOBIN: 10.9 g/dL — AB (ref 12.0–15.0)
MCH: 28.6 pg (ref 26.0–34.0)
MCHC: 33.6 g/dL (ref 30.0–36.0)
MCV: 85 fL (ref 78.0–100.0)
Platelets: 156 10*3/uL (ref 150–400)
RBC: 3.81 MIL/uL — ABNORMAL LOW (ref 3.87–5.11)
RDW: 14.4 % (ref 11.5–15.5)
WBC: 12.1 10*3/uL — AB (ref 4.0–10.5)

## 2016-12-05 LAB — GLUCOSE, CAPILLARY: GLUCOSE-CAPILLARY: 116 mg/dL — AB (ref 65–99)

## 2016-12-05 NOTE — Progress Notes (Signed)
qPhysical Therapy Treatment Patient Details Name: Maria [name] MRN: [id number] DOB: 1951-08-11 Today's Date: 12/05/2016    History of Present Illness s/p R TKA    PT Comments    Pt progressing well, feels ready for D/C    Follow Up Recommendations  Outpatient PT     Equipment Recommendations  Rolling walker with 5" wheels    Recommendations for Other Services       Precautions / Restrictions Precautions Precautions: Fall;Knee Precaution Comments: IND SLRs, KI not used Required Braces or Orthoses: Knee Immobilizer - Right Knee Immobilizer - Right: Discontinue once straight leg raise with < 10 degree lag Restrictions Weight Bearing Restrictions: No Other Position/Activity Restrictions: WBAT    Mobility  Bed Mobility               General bed mobility comments: OOB in chair  Transfers Overall transfer level: Needs assistance Equipment used: Rolling walker (2 wheeled) Transfers: Sit to/from Stand Sit to Stand: Min guard         General transfer comment: verbal cues for hand placement and RLE position  Ambulation/Gait Ambulation/Gait assistance: Min guard;Min assist Ambulation Distance (Feet): 30 Feet Assistive device: Rolling walker (2 wheeled) Gait Pattern/deviations: Step-to pattern;Decreased stance time - right;Decreased weight shift to right Gait velocity: decr   General Gait Details: cues for RW position from self and sequence   Stairs Stairs: Yes   Stair Management: No rails;Step to pattern;Backwards;With walker Number of Stairs: 1 General stair comments: cues for sequence and safe technique (pt sister present for stair trainining)  Wheelchair Mobility    Modified Rankin (Stroke Patients Only)       Balance                                            Cognition Arousal/Alertness: Awake/alert Behavior During Therapy: WFL for tasks assessed/performed Overall Cognitive Status: Within Functional Limits for tasks  assessed                                        Exercises Total Joint Exercises Ankle Circles/Pumps: AROM;Both;10 reps Quad Sets: AROM;Both;5 reps Heel Slides: AAROM;10 reps;Right;Limitations Heel Slides Limitations: incr guarding, knee flexion ~50* AAROM Hip ABduction/ADduction: 10 reps;AAROM Straight Leg Raises: AAROM;10 reps Knee Flexion: AROM;Right;5 reps;Seated    General Comments        Pertinent Vitals/Pain Pain Assessment: 0-10 Pain Score: 5  Pain Location: r knee Pain Descriptors / Indicators: Sore;Aching Pain Intervention(s): Limited activity within patient's tolerance;Monitored during session;Premedicated before session;Ice applied    Home Living                      Prior Function            PT Goals (current goals can now be found in the care plan section) Acute Rehab PT Goals Patient Stated Goal: feel better than this Time For Goal Achievement: 12/07/16 Potential to Achieve Goals: Good Progress towards PT goals: Progressing toward goals    Frequency    7X/week      PT Plan Current plan remains appropriate    Co-evaluation             End of Session   Activity Tolerance: Patient tolerated treatment well Patient left: in chair;with call bell/phone within  reach;with family/visitor present Nurse Communication: Mobility status;Other (comment) (ready for d/c) PT Visit Diagnosis: Difficulty in walking, not elsewhere classified (R26.2)     Time: 0856-9437 PT Time Calculation (min) (ACUTE ONLY): 33 min  Charges:  $Gait Training: 8-22 mins $Therapeutic Exercise: 8-22 mins                    G CodesKenyon Ana, PT Pager: [phone] 12/05/2016    Kenyon Ana 12/05/2016, 10:50 AM

## 2016-12-05 NOTE — Progress Notes (Signed)
Occupational Therapy Treatment Patient Details Name: Maria [name] MRN: [id number] DOB: 1951/02/11 Today's Date: 12/05/2016    History of present illness s/p R TKA   OT comments  Pt sister will provide A at home .  OT education complete  Follow Up Recommendations  No OT follow up    Equipment Recommendations  None recommended by OT    Recommendations for Other Services      Precautions / Restrictions Precautions Precautions: Fall;Knee Precaution Comments: IND SLRs, KI not used Required Braces or Orthoses: Knee Immobilizer - Right Knee Immobilizer - Right: Discontinue once straight leg raise with < 10 degree lag Restrictions Weight Bearing Restrictions: No Other Position/Activity Restrictions: WBAT       Mobility Bed Mobility Overal bed mobility: Needs Assistance Bed Mobility: Supine to Sit     Supine to sit: Supervision     General bed mobility comments: VC for safety and technique  Transfers Overall transfer level: Needs assistance Equipment used: Rolling walker (2 wheeled) Transfers: Sit to/from Stand Sit to Stand: Supervision Stand pivot transfers: Supervision       General transfer comment: verbal cues for hand placement and RLE position        ADL either performed or assessed with clinical judgement   ADL Overall ADL's : Needs assistance/impaired     Grooming: Set up;Sitting   Upper Body Bathing: Set up;Sitting       Upper Body Dressing : Set up   Lower Body Dressing: Minimal assistance;Cueing for sequencing;Cueing for safety;Cueing for compensatory techniques;Sit to/from stand Lower Body Dressing Details (indicate cue type and reason): pt may benefit from [facility] shoe horn. Educated where to obtain.  Pt has a Writer: Ambulation;Cueing for sequencing;Cueing for safety;Minimal assistance;Supervision/safety;RW   Toileting- Water quality scientist and Hygiene: Sit to/from stand;Cueing for safety;Cueing for  sequencing;Supervision/safety     Tub/Shower Transfer Details (indicate cue type and reason): verbalized safety with walker in shower Functional mobility during ADLs: Supervision/safety General ADL Comments: pt much improved this day     Vision Patient Visual Report: No change from baseline     Perception         Cognition Arousal/Alertness: Awake/alert Behavior During Therapy: WFL for tasks assessed/performed Overall Cognitive Status: Within Functional Limits for tasks assessed                                                General Comments      Pertinent Vitals/ Pain       Pain Assessment: 0-10 Pain Score: 10-Worst pain ever Pain Location: r knee Pain Descriptors / Indicators: Sore;Aching Pain Intervention(s): Limited activity within patient's tolerance;Monitored during session;Repositioned;Patient requesting pain meds-RN notified;RN gave pain meds during session;Ice applied         Frequency  Min 2X/week        Progress Toward Goals  OT Goals(current goals can now be found in the care plan section)  Progress towards OT goals: Progressing toward goals  Acute Rehab OT Goals Patient Stated Goal: feel better than this  Plan         End of Session Equipment Utilized During Treatment: Rolling walker  OT Visit Diagnosis: Unsteadiness on feet (R26.81);Pain   Activity Tolerance Patient tolerated treatment well   Patient Left in chair;with call bell/phone within reach   Nurse Communication Mobility status  Time: 6244-6950 OT Time Calculation (min): 26 min  Charges: OT General Charges $OT Visit: 1 Procedure OT Treatments $Self Care/Home Management : 23-37 mins  [place], [place]   Betsy Pries 12/05/2016, 10:53 AM

## 2016-12-05 NOTE — Progress Notes (Signed)
   Subjective: 2 Days Post-Op Procedure(s) (LRB): RIGHT TOTAL KNEE ARTHROPLASTY (Right) Patient reports pain as mild.   Patient seen in rounds with Dr. Wynelle Link. Patient is well, but has had some minor complaints of pain in the knee, requiring pain medications. Nausea improved. Patient is ready to go home  Objective: Vital signs in last 24 hours: Temp:  [98.3 F (36.8 C)-99 F (37.2 C)] 98.4 F (36.9 C) (03/28 0508) Pulse Rate:  [67-78] 78 (03/28 0508) Resp:  [16-17] 17 (03/28 0508) BP: (130-160)/(45-56) 142/45 (03/28 0508) SpO2:  [91 %-97 %] 91 % (03/28 0508)  Intake/Output from previous day:  Intake/Output Summary (Last 24 hours) at 12/05/16 0935 Last data filed at 12/05/16 0510  Gross per 24 hour  Intake          1359.33 ml  Output             3250 ml  Net         -1890.67 ml    Intake/Output this shift: No intake/output data recorded.  Labs:  Recent Labs  12/04/16 0414 12/05/16 0413  HGB 11.3* 10.9*    Recent Labs  12/04/16 0414 12/05/16 0413  WBC 10.4 12.1*  RBC 4.11 3.81*  HCT 34.5* 32.4*  PLT 130* 156    Recent Labs  12/04/16 0414 12/05/16 0413  NA 135 135  K 4.0 3.8  CL 104 103  CO2 27 28  BUN 13 14  CREATININE 0.70 0.75  GLUCOSE 203* 129*  CALCIUM 8.5* 8.7*   No results for input(s): LABPT, INR in the last 72 hours.  EXAM: General - Patient is Alert, Appropriate and Oriented Extremity - Neurovascular intact Sensation intact distally Intact pulses distally Dorsiflexion/Plantar flexion intact Incision - clean, dry, no drainage Motor Function - intact, moving foot and toes well on exam.   Assessment/Plan: 2 Days Post-Op Procedure(s) (LRB): RIGHT TOTAL KNEE ARTHROPLASTY (Right) Procedure(s) (LRB): RIGHT TOTAL KNEE ARTHROPLASTY (Right) Past Medical History:  Diagnosis Date  . Allergy   . Arthritis   . Asthma   . Cancer [facility])    chronic lymphacytic leukemia  . Diabetes mellitus   . Hyperlipidemia   . Hypertension   .  Hypothyroidism   . Thyroid disease    hypothyroidism   Principal Problem:   OA (osteoarthritis) of knee  Estimated body mass index is 32.28 kg/m as calculated from the following:   Height as of this encounter: 5\' 6"  (1.676 m).   Weight as of this encounter: 90.7 kg (200 lb). Up with therapy Discharge home - straight to outpatient at [place] and Diabetic diet Follow up - in 2 weeks Activity - WBAT Disposition - Home Condition Upon Discharge - Good D/C Meds - See DC Summary DVT Prophylaxis - Xarelto  Arlee Muslim, PA-C Orthopaedic Surgery 12/05/2016, 9:35 AM

## 2016-12-07 ENCOUNTER — Ambulatory Visit: Payer: BC Managed Care – PPO | Admitting: Physical Therapy

## 2016-12-10 ENCOUNTER — Ambulatory Visit: Payer: BC Managed Care – PPO | Admitting: Physical Therapy

## 2016-12-11 ENCOUNTER — Ambulatory Visit: Payer: BC Managed Care – PPO | Attending: Orthopedic Surgery | Admitting: Physical Therapy

## 2016-12-11 DIAGNOSIS — G8929 Other chronic pain: Secondary | ICD-10-CM | POA: Insufficient documentation

## 2016-12-11 DIAGNOSIS — R262 Difficulty in walking, not elsewhere classified: Secondary | ICD-10-CM | POA: Insufficient documentation

## 2016-12-11 DIAGNOSIS — M25661 Stiffness of right knee, not elsewhere classified: Secondary | ICD-10-CM | POA: Insufficient documentation

## 2016-12-11 DIAGNOSIS — M6281 Muscle weakness (generalized): Secondary | ICD-10-CM | POA: Insufficient documentation

## 2016-12-11 DIAGNOSIS — M25561 Pain in right knee: Secondary | ICD-10-CM | POA: Insufficient documentation

## 2016-12-11 NOTE — Therapy (Signed)
[facility] [address] [place], [address] Phone: [phone]   Fax:  [phone]  Physical Therapy Evaluation  Patient Details  Name: Maria [name] MRN: [id number] Date of Birth: 1950/10/07 No Data Recorded  Encounter Date: 12/11/2016    Past Medical History:  Diagnosis Date  . Allergy   . Arthritis   . Asthma   . Cancer [facility])    chronic lymphacytic leukemia  . Diabetes mellitus   . Hyperlipidemia   . Hypertension   . Hypothyroidism   . Thyroid disease    hypothyroidism    Past Surgical History:  Procedure Laterality Date  . ABDOMINAL HYSTERECTOMY    . LYMPH NODE DISSECTION    . STRABISMUS SURGERY    . TONSILLECTOMY    . TOTAL KNEE ARTHROPLASTY Right 12/03/2016   Procedure: RIGHT TOTAL KNEE ARTHROPLASTY;  Surgeon: [name], MD;  Location: [facility];  Service: Orthopedics;  Laterality: Right;    There were no vitals filed for this visit.       Subjective Assessment - 12/11/16 1355    Subjective Patient reports she had a R TKR on 12/03/2016. She reports she was initially over-medicated when she returned home. She is an Haematologist at [employer]. She reports she gets fatigued quite easily with mobility/ambulation. She likes to go out and socialize with her friends.    Pertinent History Patient does have a history of CLL (in 2002) is on chronic chemotherapy. She has a history of diabetes but has been well controlled.    Limitations Sitting;Standing;Walking;Lifting;House hold activities   Patient Stated Goals To return to walking around without as much pain. Be able to socialize with her friends.    Currently in Pain? Yes   Pain Score 6    Pain Location Knee   Pain Orientation Right   Pain Descriptors / Indicators Aching   Pain Type Chronic pain;Surgical pain   Pain Onset More than a month ago   Pain Frequency Constant   Aggravating Factors  Weight bearing.   Pain Relieving Factors Pain  medications                                            Plan - 12/11/16 1632    Clinical Impression Statement During subjective portion of evaluation patient informs PT of a blister on her knee. On inspection she had roughly a quarter sized raised blister on the medial aspect of her incision. Visually she had redness and significant swelling around the joint, with increased temperature on palpation. Patient has a history of diabetes and is on chronic immunosuppressants for CLL treated in 2002. Given the above, PT called surgical team and left message with highest importance regarding possible joint infection. Will defer PT evaluation until patient is cleared by surgical staff to participate in therapy. Informed patient to contact orthopedic office as well.       Patient will benefit from skilled therapeutic intervention in order to improve the following deficits and impairments:     Visit Diagnosis: Chronic pain of right knee     Problem List Patient Active Problem List   Diagnosis Date Noted  . CLL (chronic lymphocytic leukemia) ([place]) 07/24/2012  . PURE HYPERCHOLESTEROLEMIA 09/07/2009  . HYPOTHYROIDISM 07/29/2009  . 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 Essential hypertension 07/29/2009  .  Asthma 07/29/2009  . OA (osteoarthritis) of knee 07/29/2009  . ELECTROCARDIOGRAM, ABNORMAL 07/29/2009   Royce Macadamia PT, DPT, CSCS    12/11/2016, 4:37 PM  [facility] [address] [place], [address] Phone: [phone]   Fax:  [phone]  Name: Maria [name] MRN: [id number] Date of Birth: 08-26-1951

## 2016-12-12 ENCOUNTER — Ambulatory Visit: Payer: BC Managed Care – PPO | Admitting: Physical Therapy

## 2016-12-13 ENCOUNTER — Encounter: Payer: BC Managed Care – PPO | Admitting: Physical Therapy

## 2016-12-14 ENCOUNTER — Encounter: Payer: Self-pay | Admitting: Physical Therapy

## 2016-12-14 ENCOUNTER — Ambulatory Visit: Payer: BC Managed Care – PPO | Admitting: Physical Therapy

## 2016-12-14 DIAGNOSIS — M25561 Pain in right knee: Secondary | ICD-10-CM

## 2016-12-14 DIAGNOSIS — M6281 Muscle weakness (generalized): Secondary | ICD-10-CM

## 2016-12-14 DIAGNOSIS — R262 Difficulty in walking, not elsewhere classified: Secondary | ICD-10-CM

## 2016-12-14 DIAGNOSIS — M25661 Stiffness of right knee, not elsewhere classified: Secondary | ICD-10-CM | POA: Diagnosis present

## 2016-12-14 DIAGNOSIS — G8929 Other chronic pain: Secondary | ICD-10-CM | POA: Diagnosis present

## 2016-12-15 NOTE — Therapy (Signed)
[place] [facility] [address] [place], [address] Phone: [phone]   Fax:  [phone]  Physical Therapy Evaluation  Patient Details  Name: Maria [name] MRN: [id number] Date of Birth: 11/16/1950 Referring Provider: [name] MD  Encounter Date: 12/14/2016      PT End of Session - 12/14/16 1000    Visit Number 1   Number of Visits 12   Date for PT Re-Evaluation 01/11/17   PT Start Time 0845   PT Stop Time 0940   PT Time Calculation (min) 55 min   Activity Tolerance Patient tolerated treatment well;Patient limited by pain   Behavior During Therapy [facility] for tasks assessed/performed      Past Medical History:  Diagnosis Date  . Allergy   . Arthritis   . Asthma   . Cancer [facility])    chronic lymphacytic leukemia  . Diabetes mellitus   . Hyperlipidemia   . Hypertension   . Hypothyroidism   . Thyroid disease    hypothyroidism    Past Surgical History:  Procedure Laterality Date  . ABDOMINAL HYSTERECTOMY    . LYMPH NODE DISSECTION    . STRABISMUS SURGERY    . TONSILLECTOMY    . TOTAL KNEE ARTHROPLASTY Right 12/03/2016   Procedure: RIGHT TOTAL KNEE ARTHROPLASTY;  Surgeon: [name], MD;  Location: [facility];  Service: Orthopedics;  Laterality: Right;    There were no vitals filed for this visit.       Subjective Assessment - 12/14/16 0858    Subjective Patient reports not having much pain today 4-5/10 and she is sleepy. She continues to have swelling and pain in her right LE/knee and is not sure about elevation and ice to control pain/swelling. She reports seeing MD and has cleared for return to therapy.    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 She has a history of diabetes but has been well controlled. history of progressive knee pain x >10 years. underwent surgery for Right TKA 12/03/2016    Limitations Sitting;Standing;Walking;Lifting;House hold activities;Other (comment)  work related activties   Patient Stated Goals To return to walking around without as much pain. Be able to socialize with her friends.    Currently in Pain? Yes   Pain Score 5    Pain Location Knee   Pain Orientation Right   Pain Descriptors / Indicators Aching   Pain Type Surgical pain  12/03/2016   Pain Onset More than a month ago   Pain Frequency Constant   Aggravating Factors  weight bearing activities   Pain Relieving Factors medication, elevation   Effect of Pain on Daily Activities unable to perform normal activities due to pain, recent surgery right knee/LE            [facility] PT Assessment - 12/14/16 0904      Assessment   Medical Diagnosis Right TKA   Referring Provider [name] MD   Onset Date/Surgical Date 12/03/16   Hand Dominance Right   Next MD Visit unknown   Prior Therapy none     Restrictions   Weight Bearing Restrictions No     Balance Screen   Has the patient fallen in the past 6 months No   Has the patient had a decrease in activity level because of a fear of falling?  No   Is the patient reluctant to leave their  home because of a fear of falling?  No     Home Environment   Living Environment Private residence   Living Arrangements Alone   Type of Home Other(Comment)  townhome   Home Access Other (comment)  one step no handrails to enter   Home Layout Two level   Alternate Level Stairs-Number of Steps 10   Alternate Level Stairs-Rails Right   Home Equipment Walker - 2 wheels     Prior Function   Level of Independence Independent   Vocation Full time employment   Vocation Requirements OR nurse, standing, walking, sitting 8 hours/day   Leisure spending time with friends, going out     Cognition   Overall Cognitive Status Within Functional Limits for tasks assessed      Objective: Observation: Right LE with moderate swelling right knee with redness,  significant ecchymosis posterior thigh and calf covering large area, incision anterior right knee :with steri strips in place Gait: patient ambulating with front wheeled walker with guarded posture and decreased hip and knee flexion; instructed in proper sequencing and to shorten step length right LE to prevent foot from crossing front of walker for safety AAROM: right knee flexion 10-60 at beginning of session, improved to 70 degrees following AAROM with ball rolling underfoot. Ankle DF/PF grossly [facility] Strength testing deferred due to recent surgery Quad control decreased with patient working on improving quad setting  Outcome measures: LEFS to be assessed next session        PT Education - 12/14/16 0930    Education provided Yes   Education Details POC, home exercises: ROM exercises sitting, supine including ball roll underfoot, quad and glute sets; proper elevation and use of ice for controlling pain, swelling   Person(s) Educated Patient   Methods Explanation;Demonstration;Verbal cues;Handout   Comprehension Verbalized understanding;Returned demonstration;Verbal cues required;Need further instruction             PT Long Term Goals - 12/14/16 1030      PT LONG TERM GOAL #1   Title Patient will improve ROM right knee flexion 0-100 degrees by 01/04/2017 to improve transition sit to stand and ambulate with improved gait pattern   Baseline right knee flexion 10-60   Status New     PT LONG TERM GOAL #2   Title Patient will improve ambulation with normal gait pattern and least restrictive AD by 01/11/17 in order to improve household and community ambulation   Baseline ambulating with forward wheeled walker with guarded posture and decreased hip/knee flexion   Status New     PT LONG TERM GOAL #3   Title Patient will demonstrate improved function with daily tasks as indicated by LEFS score of 40/80 by 01/11/2017    Baseline LEFS to be assessed    Status New     PT LONG TERM GOAL #4    Title Patient will be independent with home program for pain control and ROM, strengthening exercises by discharge 01/11/2017 to allow patient to continue improvement with self management     Baseline limited knowledge of pain control strategies, appropriate exercises and progression and requires assistance, moderate cuing    Status New               Plan - 12/14/16 0945    Clinical Impression Statement Patient is a 66 year old female who presents s/p right TKA 12/03/2016. She has moderate swelling, pain, and limited ROM right knee flexion 10-60 degrees and strength in right LE/knee. She has decreased  functional ambulation and is using a walker for support. She had limited knowledge of appropriate pain control and exercises and progression in order to improve function with daily tasks and achieve goals.    Rehab Potential Good   Clinical Impairments Affecting Rehab Potential (+)acute condition right TKA, motivated, pror level of funciton(-)Diabetes, CLL, chronic condition of OA prior to surgery   PT Frequency Other (comment)  2-3x/week   PT Duration 4 weeks   PT Treatment/Interventions Electrical Stimulation;Cryotherapy;Gait training;Stair training;Patient/family education;Neuromuscular re-education;Balance training;Therapeutic exercise;Manual techniques;Scar mobilization   PT Next Visit Plan Manual therapy soft tissue techniques, therapeutic exercise, pain control   PT Home Exercise Plan ROM exercises for right knee, quad sets, glute sets, pain and swelling control   Consulted and Agree with Plan of Care Patient      Patient will benefit from skilled therapeutic intervention in order to improve the following deficits and impairments:  Decreased strength, Increased edema, Pain, Impaired perceived functional ability, Decreased activity tolerance, Difficulty walking, Decreased range of motion  Visit Diagnosis: Acute pain of right knee - Plan: PT plan of care cert/re-cert  Stiffness of  right knee, not elsewhere classified - Plan: PT plan of care cert/re-cert  Difficulty in walking, not elsewhere classified - Plan: PT plan of care cert/re-cert  Muscle weakness (generalized) - Plan: PT plan of care cert/re-cert     Problem List Patient Active Problem List   Diagnosis Date Noted  . CLL (chronic lymphocytic leukemia) ([place]) 07/24/2012  . PURE HYPERCHOLESTEROLEMIA 09/07/2009  . HYPOTHYROIDISM 07/29/2009  . 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 Essential hypertension 07/29/2009  . Asthma 07/29/2009  . OA (osteoarthritis) of knee 07/29/2009  . ELECTROCARDIOGRAM, ABNORMAL 07/29/2009    Jomarie Longs PT 12/15/2016, 3:51 PM  [place] [facility] [address] [place], [address] Phone: [phone]   Fax:  [phone]  Name: Maria [name] MRN: [id number] Date of Birth: 1950-10-29

## 2016-12-17 ENCOUNTER — Encounter: Payer: BC Managed Care – PPO | Admitting: Physical Therapy

## 2016-12-17 ENCOUNTER — Ambulatory Visit: Payer: BC Managed Care – PPO | Admitting: Physical Therapy

## 2016-12-17 DIAGNOSIS — M25561 Pain in right knee: Secondary | ICD-10-CM | POA: Diagnosis not present

## 2016-12-17 DIAGNOSIS — M25661 Stiffness of right knee, not elsewhere classified: Secondary | ICD-10-CM

## 2016-12-17 DIAGNOSIS — G8929 Other chronic pain: Secondary | ICD-10-CM

## 2016-12-17 DIAGNOSIS — R262 Difficulty in walking, not elsewhere classified: Secondary | ICD-10-CM

## 2016-12-17 DIAGNOSIS — M6281 Muscle weakness (generalized): Secondary | ICD-10-CM

## 2016-12-17 NOTE — Patient Instructions (Addendum)
LAQ against gravity x10   HS curls at end range x 10  Isometric HS curls x 10  In sitting flexed to 74 degrees with mild PT overpressure applied   Supine AAROM HS slide x 15 for 2 sets (measured final one at 73-74 degrees)  Gait assessment -- noted to be circumducting, decreased stride length bilaterally in step to pattern, eversion noted at R ankle as well. -- Cued to increase her knee flexion/hip flexion in gait   MIni squats x 12 with cuing to sit back to chair. X 8 for second set

## 2016-12-17 NOTE — Therapy (Signed)
[place] [facility] [address], [place], [address] Phone: [phone]   Fax:  [phone]  Physical Therapy Treatment  Patient Details  Name: Maria [name] MRN: [id number] Date of Birth: [birth year] Referring Provider: [name] MD  Encounter Date: 12/17/2016      PT End of Session - 12/17/16 1345    Visit Number 2   Number of Visits 12   Date for PT Re-Evaluation 01/11/17   PT Start Time 7654   PT Stop Time 1347   PT Time Calculation (min) 42 min   Activity Tolerance Patient tolerated treatment well;Patient limited by pain   Behavior During Therapy [facility] for tasks assessed/performed      Past Medical History:  Diagnosis Date  . Allergy   . Arthritis   . Asthma   . Cancer [facility])    chronic lymphacytic leukemia  . Diabetes mellitus   . Hyperlipidemia   . Hypertension   . Hypothyroidism   . Thyroid disease    hypothyroidism    Past Surgical History:  Procedure Laterality Date  . ABDOMINAL HYSTERECTOMY    . LYMPH NODE DISSECTION    . STRABISMUS SURGERY    . TONSILLECTOMY    . TOTAL KNEE ARTHROPLASTY Right 12/03/2016   Procedure: RIGHT TOTAL KNEE ARTHROPLASTY;  Surgeon: [name], MD;  [facility]: [facility];  Service: Orthopedics;  Laterality: Right;    There were no vitals filed for this visit.      Subjective Assessment - 12/17/16 1316    Subjective Patient reports she has been cleared by MD, her blister has reduced and is no longer raised. She has been performing leg elevation and exercises as indicated to reduce swelling, reports the redness around incision has decreased as well.    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 She has a history of diabetes but has been well controlled. history of progressive knee pain x >10 years. underwent surgery for Right TKA 12/03/2016   Limitations  Sitting;Standing;Walking;Lifting;House hold activities;Other (comment)  work related activties   Patient Stated Goals To return to walking around without as much pain. Be able to socialize with her friends.    Currently in Pain? Yes   Pain Score --  Does not rate, but pain increases with increased knee flexion , eases in 30 degrees of flexion (open packed position)   Pain Location Knee   Pain Orientation Right   Pain Descriptors / Indicators Aching   Pain Type Chronic pain;Surgical pain   Pain Onset More than a month ago   Pain Frequency Constant     LAQ against gravity x10   HS curls at end range x 10  Isometric HS curls x 10  In sitting flexed to 74 degrees with mild PT overpressure applied   Supine AAROM HS slide x 15 for 2 sets (measured final one at 73-74 degrees)  Gait assessment -- noted to be circumducting, decreased stride length bilaterally in step to pattern, eversion noted at R ankle as well. -- Cued to increase her knee flexion/hip flexion in gait. Performed several short bouts of exaggerated hip flexion almost mimicking marching on RLE to facilitate knee flexion in gait cycle. Noted to have decreased circumduction in gait.   MIni squats x 12 with cuing to sit back to chair. X 8 for second set  PT Education - 12/17/16 1344    Education provided Yes   Education Details Add in towel overpressure in heel slides.    Person(s) Educated Patient   Methods Explanation;Demonstration;Verbal cues;Handout   Comprehension Verbalized understanding;Returned demonstration             PT Long Term Goals - 12/14/16 1030      PT LONG TERM GOAL #1   Title Patient will improve ROM right knee flexion 0-100 degrees by 01/04/2017 to improve transition sit to stand and ambulate with improved gait pattern   Baseline right knee flexion 10-60   Status New     PT LONG TERM GOAL #2   Title Patient will improve ambulation with normal  gait pattern and least restrictive AD by 01/11/17 in order to improve household and community ambulation   Baseline ambulating with forward wheeled walker with guarded posture and decreased hip/knee flexion   Status New     PT LONG TERM GOAL #3   Title Patient will demonstrate improved function with daily tasks as indicated by LEFS score of 40/80 by 01/11/2017    Baseline LEFS to be assessed    Status New     PT LONG TERM GOAL #4   Title Patient will be independent with home program for pain control and ROM, strengthening exercises by discharge 01/11/2017 to allow patient to continue improvement with self management     Baseline limited knowledge of pain control strategies, appropriate exercises and progression and requires assistance, moderate cuing    Status New               Plan - 12/17/16 1345    Clinical Impression Statement Patient progresses well with ROM this date to 74 degrees of knee flexion in AAROM. She has decreased redness and her blister has gone down as well. She demonstrates global LE weakness with gait, she is still completing step to pattern for pain control.    Rehab Potential Good   Clinical Impairments Affecting Rehab Potential (+)acute condition right TKA, motivated, pror level of funciton(-)Diabetes, CLL, chronic condition of OA prior to surgery   PT Frequency Other (comment)  2-3x/week   PT Duration 4 weeks   PT Treatment/Interventions Electrical Stimulation;Cryotherapy;Gait training;Stair training;Patient/family education;Neuromuscular re-education;Balance training;Therapeutic exercise;Manual techniques;Scar mobilization   PT Next Visit Plan Manual therapy soft tissue techniques, therapeutic exercise, pain control   PT Home Exercise Plan ROM exercises for right knee, quad sets, glute sets, pain and swelling control   Consulted and Agree with Plan of Care Patient      Patient will benefit from skilled therapeutic intervention in order to improve the following  deficits and impairments:  Decreased strength, Increased edema, Pain, Impaired perceived functional ability, Decreased activity tolerance, Difficulty walking, Decreased range of motion  Visit Diagnosis: Acute pain of right knee  Stiffness of right knee, not elsewhere classified  Difficulty in walking, not elsewhere classified  Chronic pain of right knee  Muscle weakness (generalized)     Problem List Patient Active Problem List   Diagnosis Date Noted  . CLL (chronic lymphocytic leukemia) ([place]) 07/24/2012  . PURE HYPERCHOLESTEROLEMIA 09/07/2009  . HYPOTHYROIDISM 07/29/2009  . 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 Essential hypertension 07/29/2009  . Asthma 07/29/2009  . OA (osteoarthritis) of knee 07/29/2009  . ELECTROCARDIOGRAM, ABNORMAL 07/29/2009   Royce Macadamia PT, DPT, CSCS    12/17/2016, 3:58 PM  [place] [facility] [address]  [place], [place], [address] Phone: [phone]   Fax:  [phone]  Name: Maria [name] MRN: [id number] Date of Birth: 1950-10-31

## 2016-12-20 ENCOUNTER — Ambulatory Visit: Payer: BC Managed Care – PPO | Admitting: Physical Therapy

## 2016-12-20 ENCOUNTER — Encounter: Payer: BC Managed Care – PPO | Admitting: Physical Therapy

## 2016-12-20 DIAGNOSIS — R262 Difficulty in walking, not elsewhere classified: Secondary | ICD-10-CM

## 2016-12-20 DIAGNOSIS — M25561 Pain in right knee: Secondary | ICD-10-CM | POA: Diagnosis not present

## 2016-12-20 DIAGNOSIS — M25661 Stiffness of right knee, not elsewhere classified: Secondary | ICD-10-CM

## 2016-12-20 DIAGNOSIS — G8929 Other chronic pain: Secondary | ICD-10-CM

## 2016-12-20 NOTE — Patient Instructions (Signed)
LAQ with [place] stim ot 60mA x 5 minutes with 2# ankle weight   Overpressure low load long duration stretch holds in sitting with High volt stim at 120 V on distal quadriceps   Mini squats to 70 degrees of flexion x 12, x 10  1st step, 2nd step lunge stretch positions to 84 degrees   Gait training/observation.

## 2016-12-24 ENCOUNTER — Encounter: Payer: BC Managed Care – PPO | Admitting: Physical Therapy

## 2016-12-24 ENCOUNTER — Ambulatory Visit: Payer: BC Managed Care – PPO | Admitting: Physical Therapy

## 2016-12-24 DIAGNOSIS — M25561 Pain in right knee: Secondary | ICD-10-CM | POA: Diagnosis not present

## 2016-12-24 DIAGNOSIS — G8929 Other chronic pain: Secondary | ICD-10-CM

## 2016-12-24 DIAGNOSIS — M25661 Stiffness of right knee, not elsewhere classified: Secondary | ICD-10-CM

## 2016-12-24 DIAGNOSIS — R262 Difficulty in walking, not elsewhere classified: Secondary | ICD-10-CM

## 2016-12-24 NOTE — Therapy (Signed)
[place] [facility] [address], [place], [address] Phone: [phone]   Fax:  [phone]  Physical Therapy Treatment  Patient Details  Name: Maria [name] MRN: [id number] Date of Birth: Oct 24, 1950 Referring Provider: [name] MD  Encounter Date: 12/20/2016      PT End of Session - 12/24/16 1306    Visit Number 3   Number of Visits 12   Date for PT Re-Evaluation 01/11/17   PT Start Time 1330   PT Stop Time 1415   PT Time Calculation (min) 45 min   Activity Tolerance Patient tolerated treatment well;Patient limited by pain   Behavior During Therapy [facility] for tasks assessed/performed      Past Medical History:  Diagnosis Date  . Allergy   . Arthritis   . Asthma   . Cancer [facility])    chronic lymphacytic leukemia  . Diabetes mellitus   . Hyperlipidemia   . Hypertension   . Hypothyroidism   . Thyroid disease    hypothyroidism    Past Surgical History:  Procedure Laterality Date  . ABDOMINAL HYSTERECTOMY    . LYMPH NODE DISSECTION    . STRABISMUS SURGERY    . TONSILLECTOMY    . TOTAL KNEE ARTHROPLASTY Right 12/03/2016   Procedure: RIGHT TOTAL KNEE ARTHROPLASTY;  Surgeon: [name], MD;  [facility]: [facility];  Service: Orthopedics;  Laterality: Right;    There were no vitals filed for this visit.      Subjective Assessment - 12/24/16 1307    Subjective Patient reports she had a good check up with MD. She reports he was able to get her ROM to > 90 degrees, which he was quite pleased with. She continues to have pain, but has been able to reduce her pain gradually from the surgery.    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 She has a history of diabetes but has been well controlled. history of progressive knee pain x >10 years. underwent surgery for Right TKA 12/03/2016   Limitations  Sitting;Standing;Walking;Lifting;House hold activities;Other (comment)  work related activties   Patient Stated Goals To return to walking around without as much pain. Be able to socialize with her friends.    Currently in Pain? Yes   Pain Score --  Patient reports she has pain around the top of the incision, but has reduced since surgery.    Pain Location Knee   Pain Orientation Right   Pain Descriptors / Indicators Aching   Pain Type Chronic pain;Surgical pain   Pain Onset More than a month ago   Pain Frequency Constant      LAQ with Russian stim ot 68mA x 5 minutes with 2# ankle weight   Overpressure low load long duration stretch holds in sitting with High volt stim at 120 V on distal quadriceps -- total of 7 minutes performed, used e-stim for pain modulation throughout bout of overpressure.   Mini squats to 70 degrees of flexion x 12, x 10  1st step, 2nd step lunge stretch positions to 84 degrees -- reports pain at end range, but tolerable throughout range.   Gait training/observation. -- patient presents with RW, initially performing step to gait pattern, with decreased stride lengths, notable for circumduction style gait pattern on RLE. Cued patient with video feedback and verbal cuing to begin flexing at her hip more to increase hip/knee flexion  in gait to decrease circumduction. On video tape patient is able to ambulate with decreased circumduction, though still limited in stride length. She was able to perform step through gait pattern, though limited in symmetry (due to her RW placement, she takes longer strides with her RLE than LLE as her RW does not move continuously, continued to cue to keep constant movement of RW for more symmetry in gait.                            PT Education - 12/24/16 1306    Education provided Yes   Education Details Improving in her ROM, will use stim to help facilitate quad contraction.    Person(s) Educated Patient    Methods Explanation;Demonstration   Comprehension Verbalized understanding;Returned demonstration             PT Long Term Goals - 12/14/16 1030      PT LONG TERM GOAL #1   Title Patient will improve ROM right knee flexion 0-100 degrees by 01/04/2017 to improve transition sit to stand and ambulate with improved gait pattern   Baseline right knee flexion 10-60   Status New     PT LONG TERM GOAL #2   Title Patient will improve ambulation with normal gait pattern and least restrictive AD by 01/11/17 in order to improve household and community ambulation   Baseline ambulating with forward wheeled walker with guarded posture and decreased hip/knee flexion   Status New     PT LONG TERM GOAL #3   Title Patient will demonstrate improved function with daily tasks as indicated by LEFS score of 40/80 by 01/11/2017    Baseline LEFS to be assessed    Status New     PT LONG TERM GOAL #4   Title Patient will be independent with home program for pain control and ROM, strengthening exercises by discharge 01/11/2017 to allow patient to continue improvement with self management     Baseline limited knowledge of pain control strategies, appropriate exercises and progression and requires assistance, moderate cuing    Status New               Plan - 12/24/16 1306    Clinical Impression Statement Patient is able to increase her ROM up to 84 degrees in this date in weightbearing stretching. Her gait pattern is normalizing with cuing, though she continues to opt for step-to pattern with RW.    Rehab Potential Good   Clinical Impairments Affecting Rehab Potential (+)acute condition right TKA, motivated, pror level of funciton(-)Diabetes, CLL, chronic condition of OA prior to surgery   PT Frequency Other (comment)  2-3x/week   PT Duration 4 weeks   PT Treatment/Interventions Electrical Stimulation;Cryotherapy;Gait training;Stair training;Patient/family education;Neuromuscular re-education;Balance  training;Therapeutic exercise;Manual techniques;Scar mobilization   PT Next Visit Plan Manual therapy soft tissue techniques, therapeutic exercise, pain control   PT Home Exercise Plan ROM exercises for right knee, quad sets, glute sets, pain and swelling control   Consulted and Agree with Plan of Care Patient      Patient will benefit from skilled therapeutic intervention in order to improve the following deficits and impairments:  Decreased strength, Increased edema, Pain, Impaired perceived functional ability, Decreased activity tolerance, Difficulty walking, Decreased range of motion  Visit Diagnosis: Stiffness of right knee, not elsewhere classified  Difficulty in walking, not elsewhere classified  Chronic pain of right knee     Problem List Patient Active Problem List  Diagnosis Date Noted  . CLL (chronic lymphocytic leukemia) ([place]) 07/24/2012  . PURE HYPERCHOLESTEROLEMIA 09/07/2009  . HYPOTHYROIDISM 07/29/2009  . 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 Essential hypertension 07/29/2009  . Asthma 07/29/2009  . OA (osteoarthritis) of knee 07/29/2009  . ELECTROCARDIOGRAM, ABNORMAL 07/29/2009   Royce Macadamia PT, DPT, CSCS    12/24/2016, 1:10 PM  [place] [facility] [address] [place], [address] Phone: [phone]   Fax:  [phone]  Name: Maria [name] MRN: [id number] Date of Birth: [birth year]

## 2016-12-25 NOTE — Patient Instructions (Signed)
Sit to stands with [place] stim 31mA 5 minutes of 10" on 10" off x 5 minutes  LAQ with hold at full extension with 25mA [place] stim x 3 minutes   Started at 1307  BP on R arm 82/58 Pulse - 75 On L arm - 115/50 HR- 76 (she reports normally she is in the 160s, began feeling progressively more light headed, like she might pass out).   Patient placed in supine with legs elevated - 135/55 with HR -67 bpm  138/61 - HR - 70 bpm  141/62  HR - 71 bpm  142/55 HR - 67 bpm   Sitting 126/63 Standing 117/58

## 2016-12-25 NOTE — Therapy (Signed)
[facility] [address] [place], [address] Phone: [phone]   Fax:  [phone]  Physical Therapy Treatment  Patient Details  Name: Maria [name] MRN: [id number] Date of Birth: 05/18/1951 Referring Provider: [name] MD  Encounter Date: 12/24/2016      PT End of Session - 12/25/16 1643    Visit Number 4   Number of Visits 12   Date for PT Re-Evaluation 01/11/17   PT Start Time 1300   PT Stop Time 1400   PT Time Calculation (min) 60 min   Activity Tolerance Patient tolerated treatment well;Treatment limited secondary to medical complications (Comment)  Drop in BP and symptoms associated with.    Behavior During Therapy [facility] for tasks assessed/performed      Past Medical History:  Diagnosis Date  . Allergy   . Arthritis   . Asthma   . Cancer [facility])    chronic lymphacytic leukemia  . Diabetes mellitus   . Hyperlipidemia   . Hypertension   . Hypothyroidism   . Thyroid disease    hypothyroidism    Past Surgical History:  Procedure Laterality Date  . ABDOMINAL HYSTERECTOMY    . LYMPH NODE DISSECTION    . STRABISMUS SURGERY    . TONSILLECTOMY    . TOTAL KNEE ARTHROPLASTY Right 12/03/2016   Procedure: RIGHT TOTAL KNEE ARTHROPLASTY;  Surgeon: [name], MD;  Location: [facility];  Service: Orthopedics;  Laterality: Right;    There were no vitals filed for this visit.      Subjective Assessment - 12/25/16 1642    Subjective Patient reports she was able to get out of the house over the weekend for the first time. She has ordered a refill on Tramadol, though has been backing off of Dilaudid. She was able to clean the cat's litter box today for the first time.    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 She has a history of diabetes but has been well controlled. history of progressive knee pain  x >10 years. underwent surgery for Right TKA 12/03/2016   Limitations Sitting;Standing;Walking;Lifting;House hold activities;Other (comment)  work related activties   Patient Stated Goals To return to walking around without as much pain. Be able to socialize with her friends.    Currently in Pain? Yes   Pain Score --  Patient reports pain is manageable, but does not rate on 0-10 scale   Pain Location Knee   Pain Orientation Right   Pain Descriptors / Indicators Aching   Pain Type Chronic pain;Surgical pain   Pain Onset More than a month ago   Pain Frequency Constant   Aggravating Factors  Weightbearing activities    Pain Relieving Factors medication, elevation       Sit to stands with [place] stim 9mA 5 minutes of 10" on 10" off x 5 minutes  LAQ with hold at full extension with 72mA [place] stim x 3 minutes    Performed soft tissue mobilization with Edge tool x 10 minutes to distal quadriceps to increase knee ROM, began to perform low load long duration stretching mobilizations in sitting when patient began to complain of dizziness/light headed feeling.   BP on R arm 82/58 Pulse - 75 On L arm - 115/50 HR- 76 (she reports normally she is in the 160s, began feeling progressively more light headed, like she might pass  out).   Patient placed in supine with legs elevated - 135/55 with HR -67 bpm  138/61 - HR - 70 bpm  141/62  HR - 71 bpm  142/55 HR - 67 bpm   Sitting 126/63 Standing 117/58                           PT Education - 12/25/16 1643    Education provided Yes   Education Details Continue to monitor her BP. Likely needs to hydrate.    Person(s) Educated Patient   Methods Explanation;Demonstration   Comprehension Verbalized understanding;Returned demonstration             PT Long Term Goals - 12/14/16 1030      PT LONG TERM GOAL #1   Title Patient will improve ROM right knee flexion 0-100 degrees by 01/04/2017 to improve transition sit to  stand and ambulate with improved gait pattern   Baseline right knee flexion 10-60   Status New     PT LONG TERM GOAL #2   Title Patient will improve ambulation with normal gait pattern and least restrictive AD by 01/11/17 in order to improve household and community ambulation   Baseline ambulating with forward wheeled walker with guarded posture and decreased hip/knee flexion   Status New     PT LONG TERM GOAL #3   Title Patient will demonstrate improved function with daily tasks as indicated by LEFS score of 40/80 by 01/11/2017    Baseline LEFS to be assessed    Status New     PT LONG TERM GOAL #4   Title Patient will be independent with home program for pain control and ROM, strengthening exercises by discharge 01/11/2017 to allow patient to continue improvement with self management     Baseline limited knowledge of pain control strategies, appropriate exercises and progression and requires assistance, moderate cuing    Status New               Plan - 12/25/16 1644    Clinical Impression Statement Patient was completing session and exercises well when she reported she felt dizzy/light headed. Her initial BP was quite low, increased steadily with supine and elevation of her feet, though this took prolonged time to complete. Educated patient to monitor BP over the next few days and discuss with PCP if any similar episodes occur. Her LE strength and ROM have been increasing appropriately.   Rehab Potential Good   Clinical Impairments Affecting Rehab Potential (+)acute condition right TKA, motivated, pror level of funciton(-)Diabetes, CLL, chronic condition of OA prior to surgery   PT Frequency Other (comment)  2-3x/week   PT Duration 4 weeks   PT Treatment/Interventions Electrical Stimulation;Cryotherapy;Gait training;Stair training;Patient/family education;Neuromuscular re-education;Balance training;Therapeutic exercise;Manual techniques;Scar mobilization   PT Next Visit Plan Manual  therapy soft tissue techniques, therapeutic exercise, pain control   PT Home Exercise Plan ROM exercises for right knee, quad sets, glute sets, pain and swelling control   Consulted and Agree with Plan of Care Patient      Patient will benefit from skilled therapeutic intervention in order to improve the following deficits and impairments:  Decreased strength, Increased edema, Pain, Impaired perceived functional ability, Decreased activity tolerance, Difficulty walking, Decreased range of motion  Visit Diagnosis: Stiffness of right knee, not elsewhere classified  Difficulty in walking, not elsewhere classified  Chronic pain of right knee     Problem List Patient Active Problem List   Diagnosis Date  Noted  . CLL (chronic lymphocytic leukemia) ([place]) 07/24/2012  . PURE HYPERCHOLESTEROLEMIA 09/07/2009  . HYPOTHYROIDISM 07/29/2009  . 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 Essential hypertension 07/29/2009  . Asthma 07/29/2009  . OA (osteoarthritis) of knee 07/29/2009  . ELECTROCARDIOGRAM, ABNORMAL 07/29/2009    Royce Macadamia PT, DPT, CSCS    12/25/2016, 6:00 PM  [place] [facility] [address], [place], [address] Phone: [phone]   Fax:  [phone]  Name: Maria [name] MRN: [id number] Date of Birth: [birth year]

## 2016-12-27 ENCOUNTER — Encounter: Payer: BC Managed Care – PPO | Admitting: Physical Therapy

## 2016-12-27 ENCOUNTER — Ambulatory Visit: Payer: BC Managed Care – PPO | Admitting: Physical Therapy

## 2016-12-27 DIAGNOSIS — M25661 Stiffness of right knee, not elsewhere classified: Secondary | ICD-10-CM

## 2016-12-27 DIAGNOSIS — M25561 Pain in right knee: Secondary | ICD-10-CM | POA: Diagnosis not present

## 2016-12-27 DIAGNOSIS — R262 Difficulty in walking, not elsewhere classified: Secondary | ICD-10-CM

## 2016-12-27 DIAGNOSIS — G8929 Other chronic pain: Secondary | ICD-10-CM

## 2016-12-27 NOTE — Therapy (Signed)
[place] [address], [place], [address] Phone: [phone]   Fax:  [phone]  Physical Therapy Treatment  Patient Details  Name: Maria [name] MRN: [id number] Date of Birth: 09-12-1950 Referring Provider: [name] MD  Encounter Date: 12/27/2016      PT End of Session - 12/27/16 1536    Visit Number 5   Number of Visits 12   Date for PT Re-Evaluation 01/11/17   PT Start Time 4967   PT Stop Time 1345   PT Time Calculation (min) 40 min   Activity Tolerance Patient tolerated treatment well   Behavior During Therapy [facility] for tasks assessed/performed      Past Medical History:  Diagnosis Date  . Allergy   . Arthritis   . Asthma   . Cancer [facility])    chronic lymphacytic leukemia  . Diabetes mellitus   . Hyperlipidemia   . Hypertension   . Hypothyroidism   . Thyroid disease    hypothyroidism    Past Surgical History:  Procedure Laterality Date  . ABDOMINAL HYSTERECTOMY    . LYMPH NODE DISSECTION    . STRABISMUS SURGERY    . TONSILLECTOMY    . TOTAL KNEE ARTHROPLASTY Right 12/03/2016   Procedure: RIGHT TOTAL KNEE ARTHROPLASTY;  Surgeon: [name], MD;  Location: [facility];  Service: Orthopedics;  Laterality: Right;    There were no vitals filed for this visit.      Subjective Assessment - 12/27/16 1317    Subjective Patient reports she has been diligent with keeping her fluid levels up since her last session. She has been working hard on increasing her ROM as tolerated at home.    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 She has a history of diabetes but has been well controlled. history of progressive knee pain x >10 years. underwent surgery for Right TKA 12/03/2016   Limitations Sitting;Standing;Walking;Lifting;House hold activities;Other (comment)  work related activties   Patient  Stated Goals To return to walking around without as much pain. Be able to socialize with her friends.    Currently in Pain? Yes  Mild pain, took medication a few hours ago   Pain Score --  Reports pain as mild currently.    Pain Location Knee   Pain Orientation Right   Pain Descriptors / Indicators Aching   Pain Type Chronic pain;Surgical pain   Pain Onset More than a month ago   Pain Frequency Constant      BP - 144/63  Edge tool performed soft tissue mobilization to distal quadriceps muscle bellies/tendons to increase ROM into flexed position. Performed 8 bouts of 15-30" of static low load long duration knee flexion stretching in sitting position -- well tolerated.   ROM to 84 degrees initially on - 2nd step   2nd step lunge steps to 86 degrees after using the edge tool and performing multiple bouts of 2nd step lunge stretching.   Sit to stands from black mat table with [place] stim continuous at 27 mA -- 4 sets  x8 repetitions ( well tolerated)   To 93 degrees on 2nd step after completion of sit to stand   Gait without device using railing and therapist hand to complete, continues to laterally flex trunk to compensate for RLE weakness/decreased weightbearing tolerance. Able to complete x 15' with single HHA with no buckling noted. She  is able to perform step through pattern, though decreased stride length relative to use of RW.                            PT Education - 12/27/16 1535    Education provided Yes   Education Details Will try to wean off the RW next week.    Person(s) Educated Patient   Methods Explanation;Demonstration;Handout   Comprehension Verbalized understanding;Returned demonstration             PT Long Term Goals - 12/14/16 1030      PT LONG TERM GOAL #1   Title Patient will improve ROM right knee flexion 0-100 degrees by 01/04/2017 to improve transition sit to stand and ambulate with improved gait pattern   Baseline right knee  flexion 10-60   Status New     PT LONG TERM GOAL #2   Title Patient will improve ambulation with normal gait pattern and least restrictive AD by 01/11/17 in order to improve household and community ambulation   Baseline ambulating with forward wheeled walker with guarded posture and decreased hip/knee flexion   Status New     PT LONG TERM GOAL #3   Title Patient will demonstrate improved function with daily tasks as indicated by LEFS score of 40/80 by 01/11/2017    Baseline LEFS to be assessed    Status New     PT LONG TERM GOAL #4   Title Patient will be independent with home program for pain control and ROM, strengthening exercises by discharge 01/11/2017 to allow patient to continue improvement with self management     Baseline limited knowledge of pain control strategies, appropriate exercises and progression and requires assistance, moderate cuing    Status New               Plan - 12/27/16 1536    Clinical Impression Statement Patient demonstrates improved ROM this date, continuing to progress with quadriceps strength. She is able to attempt ambulation without RW and HHA, she does not yet have the requisite strength through her RLE to tolerate load bearing for gait without device, though improving nicely towards this.    Rehab Potential Good   Clinical Impairments Affecting Rehab Potential (+)acute condition right TKA, motivated, pror level of funciton(-)Diabetes, CLL, chronic condition of OA prior to surgery   PT Frequency Other (comment)  2-3x/week   PT Duration 4 weeks   PT Treatment/Interventions Electrical Stimulation;Cryotherapy;Gait training;Stair training;Patient/family education;Neuromuscular re-education;Balance training;Therapeutic exercise;Manual techniques;Scar mobilization   PT Next Visit Plan Manual therapy soft tissue techniques, therapeutic exercise, pain control   PT Home Exercise Plan ROM exercises for right knee, quad sets, glute sets, pain and swelling control    Consulted and Agree with Plan of Care Patient      Patient will benefit from skilled therapeutic intervention in order to improve the following deficits and impairments:  Decreased strength, Increased edema, Pain, Impaired perceived functional ability, Decreased activity tolerance, Difficulty walking, Decreased range of motion  Visit Diagnosis: Stiffness of right knee, not elsewhere classified  Difficulty in walking, not elsewhere classified  Chronic pain of right knee     Problem List Patient Active Problem List   Diagnosis Date Noted  . CLL (chronic lymphocytic leukemia) ([place]) 07/24/2012  . PURE HYPERCHOLESTEROLEMIA 09/07/2009  . HYPOTHYROIDISM 07/29/2009  . 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 Essential hypertension 07/29/2009  . Asthma 07/29/2009  . OA (osteoarthritis)  of knee 07/29/2009  . ELECTROCARDIOGRAM, ABNORMAL 07/29/2009   Royce Macadamia PT, DPT, CSCS    12/28/2016, 10:05 AM  [facility] [address] [place], [address] Phone: [phone]   Fax:  [phone]  Name: Maria [name] MRN: [id number] Date of Birth: 05-11-1951

## 2016-12-27 NOTE — Patient Instructions (Addendum)
BP - 144/63  Edge tool   ROM to 84 degrees  2nd step lunge steps to 86 degrees   Sit to stands from black mat table with [place] stim continuous at 27 mA   To 93 degrees   Gait without device using railing

## 2016-12-31 ENCOUNTER — Encounter: Payer: BC Managed Care – PPO | Admitting: Physical Therapy

## 2016-12-31 ENCOUNTER — Ambulatory Visit: Payer: BC Managed Care – PPO | Admitting: Physical Therapy

## 2016-12-31 DIAGNOSIS — R262 Difficulty in walking, not elsewhere classified: Secondary | ICD-10-CM

## 2016-12-31 DIAGNOSIS — G8929 Other chronic pain: Secondary | ICD-10-CM

## 2016-12-31 DIAGNOSIS — M25661 Stiffness of right knee, not elsewhere classified: Secondary | ICD-10-CM

## 2016-12-31 DIAGNOSIS — M25561 Pain in right knee: Secondary | ICD-10-CM

## 2016-12-31 NOTE — Patient Instructions (Addendum)
BP - 124/57  HR -72  Edge tool  Long duration stretching   Step ups with HHA bilaterally   Gait training   TRX sit to stands for speed   Leg Press 45# x 8, 35# x 8 for 2 sets for speed

## 2017-01-01 NOTE — Therapy (Signed)
[facility] [address] [place], [address] Phone: [phone]   Fax:  [phone]  Physical Therapy Treatment  Patient Details  Name: Maria [name] MRN: [id number] Date of Birth: [birth year] Referring Provider: [name] MD  Encounter Date: 12/31/2016      PT End of Session - 12/31/16 1353    Visit Number 6   Number of Visits 12   Date for PT Re-Evaluation 01/11/17   PT Start Time 1301   PT Stop Time 1350   PT Time Calculation (min) 49 min   Activity Tolerance Patient tolerated treatment well   Behavior During Therapy [facility] for tasks assessed/performed      Past Medical History:  Diagnosis Date  . Allergy   . Arthritis   . Asthma   . Cancer [facility])    chronic lymphacytic leukemia  . Diabetes mellitus   . Hyperlipidemia   . Hypertension   . Hypothyroidism   . Thyroid disease    hypothyroidism    Past Surgical History:  Procedure Laterality Date  . ABDOMINAL HYSTERECTOMY    . LYMPH NODE DISSECTION    . STRABISMUS SURGERY    . TONSILLECTOMY    . TOTAL KNEE ARTHROPLASTY Right 12/03/2016   Procedure: RIGHT TOTAL KNEE ARTHROPLASTY;  Surgeon: [name], MD;  Location: [facility];  Service: Orthopedics;  Laterality: Right;    There were no vitals filed for this visit.      Subjective Assessment - 12/31/16 1304    Subjective Patient reports she has been feeling stronger and with less pain, no real flare ups since previous session. She has continued to use RW due to fear of falling without it.    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 She has a history of diabetes but has been well controlled. history of progressive knee pain x >10 years. underwent surgery for Right TKA 12/03/2016   Limitations Sitting;Standing;Walking;Lifting;House hold activities;Other (comment)  work related activties   Patient Stated Goals To return to walking around without as much pain. Be able to socialize with her friends.    Currently in Pain? Yes   Pain Score --  Reports as mild pain around incision   Pain Location Knee   Pain Orientation Right   Pain Descriptors / Indicators Aching   Pain Type Chronic pain;Surgical pain   Pain Onset More than a month ago   Pain Frequency Intermittent   Aggravating Factors  Increased walking       BP - 124/57  HR -72  Edge tool- to distal quadriceps, no pain reported, continuing to notice decrease in bruising, though myofascial restrictions palpated throughout bout.   Long duration stretching -- tolerated increased ROM from previous sessions, provided grade III/IV overpressure into flexion while seated. Patient reported stiffness sensation, but no overt increase in pain level.   2nd step lunge flexion stretch to 91/92 degrees after completion, again more stiffness than pain reported.   Step ups with HHA bilaterally to 2 blocks with hands on walker 2 sets x 10 repetitions (cuing for upright trunk for increased knee extensor demand).   Gait training with cane and HHA , noted to continue to rely heavily on HHA and cane, decreased step length and step to pattern noted. She reports no increase in pain, cued to increase stride length and reduced trunk lateral displacement.   TRX sit  to stands for speed -- 3 sets x 6 repetitions   Leg Press 45# x 8, 35# x 8 for 2 sets for speed                            PT Education - 12/31/16 1353    Education provided Yes   Education Details Continue to focus strongly on ROM until we reach 110 degrees.    Person(s) Educated Patient   Methods Explanation;Demonstration   Comprehension Verbalized understanding;Returned demonstration             PT Long Term Goals - 12/14/16 1030      PT LONG TERM GOAL #1   Title Patient will improve ROM right knee flexion 0-100 degrees by 01/04/2017 to improve  transition sit to stand and ambulate with improved gait pattern   Baseline right knee flexion 10-60   Status New     PT LONG TERM GOAL #2   Title Patient will improve ambulation with normal gait pattern and least restrictive AD by 01/11/17 in order to improve household and community ambulation   Baseline ambulating with forward wheeled walker with guarded posture and decreased hip/knee flexion   Status New     PT LONG TERM GOAL #3   Title Patient will demonstrate improved function with daily tasks as indicated by LEFS score of 40/80 by 01/11/2017    Baseline LEFS to be assessed    Status New     PT LONG TERM GOAL #4   Title Patient will be independent with home program for pain control and ROM, strengthening exercises by discharge 01/11/2017 to allow patient to continue improvement with self management     Baseline limited knowledge of pain control strategies, appropriate exercises and progression and requires assistance, moderate cuing    Status New               Plan - 12/31/16 1353    Clinical Impression Statement Patient continues to make small improvements in ROM into flexion this date. Her bruising and scab are much improved over the last week. She does not have either the confidence or strength as of yet to walk independently or with cane, though improving gait pattern noted (reports she has had reduced step length for years). Her quadicep strength continues to improve.   Rehab Potential Good   Clinical Impairments Affecting Rehab Potential (+)acute condition right TKA, motivated, pror level of funciton(-)Diabetes, CLL, chronic condition of OA prior to surgery   PT Frequency Other (comment)  2-3x/week   PT Duration 4 weeks   PT Treatment/Interventions Electrical Stimulation;Cryotherapy;Gait training;Stair training;Patient/family education;Neuromuscular re-education;Balance training;Therapeutic exercise;Manual techniques;Scar mobilization   PT Next Visit Plan Manual therapy soft  tissue techniques, therapeutic exercise, pain control   PT Home Exercise Plan ROM exercises for right knee, quad sets, glute sets, pain and swelling control   Consulted and Agree with Plan of Care Patient      Patient will benefit from skilled therapeutic intervention in order to improve the following deficits and impairments:  Decreased strength, Increased edema, Pain, Impaired perceived functional ability, Decreased activity tolerance, Difficulty walking, Decreased range of motion  Visit Diagnosis: Stiffness of right knee, not elsewhere classified  Difficulty in walking, not elsewhere classified  Chronic pain of right knee     Problem List Patient Active Problem List   Diagnosis Date Noted  . CLL (chronic lymphocytic leukemia) ([place]) 07/24/2012  . PURE HYPERCHOLESTEROLEMIA 09/07/2009  . HYPOTHYROIDISM  07/29/2009  . 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 Essential hypertension 07/29/2009  . Asthma 07/29/2009  . OA (osteoarthritis) of knee 07/29/2009  . ELECTROCARDIOGRAM, ABNORMAL 07/29/2009   Royce Macadamia PT, DPT, CSCS    01/01/2017, 3:23 PM  [facility] [address], [place], [address] Phone: [phone]   Fax:  [phone]  Name: Maria [name] MRN: [id number] Date of Birth: Feb 02, 1951

## 2017-01-03 ENCOUNTER — Ambulatory Visit: Payer: BC Managed Care – PPO | Admitting: Physical Therapy

## 2017-01-03 ENCOUNTER — Encounter: Payer: BC Managed Care – PPO | Admitting: Physical Therapy

## 2017-01-03 DIAGNOSIS — M25561 Pain in right knee: Secondary | ICD-10-CM | POA: Diagnosis not present

## 2017-01-03 DIAGNOSIS — M25661 Stiffness of right knee, not elsewhere classified: Secondary | ICD-10-CM

## 2017-01-03 DIAGNOSIS — R262 Difficulty in walking, not elsewhere classified: Secondary | ICD-10-CM

## 2017-01-03 DIAGNOSIS — G8929 Other chronic pain: Secondary | ICD-10-CM

## 2017-01-03 NOTE — Therapy (Signed)
[facility] [address] [place], [address] Phone: [phone]   Fax:  [phone]  Physical Therapy Treatment  Patient Details  Name: Maria [name] MRN: [id number] Date of Birth: October 29, 1950 Referring Provider: [name] MD  Encounter Date: 01/03/2017      PT End of Session - 01/03/17 1359    Visit Number 7   Number of Visits 12   Date for PT Re-Evaluation 01/11/17   PT Start Time 6962   PT Stop Time 1345   PT Time Calculation (min) 40 min   Activity Tolerance Patient tolerated treatment well   Behavior During Therapy [facility] for tasks assessed/performed      Past Medical History:  Diagnosis Date  . Allergy   . Arthritis   . Asthma   . Cancer [facility])    chronic lymphacytic leukemia  . Diabetes mellitus   . Hyperlipidemia   . Hypertension   . Hypothyroidism   . Thyroid disease    hypothyroidism    Past Surgical History:  Procedure Laterality Date  . ABDOMINAL HYSTERECTOMY    . LYMPH NODE DISSECTION    . STRABISMUS SURGERY    . TONSILLECTOMY    . TOTAL KNEE ARTHROPLASTY Right 12/03/2016   Procedure: RIGHT TOTAL KNEE ARTHROPLASTY;  Surgeon: [name], MD;  Location: [facility];  Service: Orthopedics;  Laterality: Right;    There were no vitals filed for this visit.      Subjective Assessment - 01/03/17 1312    Subjective Patient reports she was able to walk around the house a little without the walker. She has been backing off her pain meds, had a bad night sleeping last night. She has more stiffness than pain currently.    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 She has a history of diabetes but has been well controlled. history of progressive knee pain x >10 years. underwent surgery for Right TKA 12/03/2016   Limitations Sitting;Standing;Walking;Lifting;House hold activities;Other (comment)   work related activties   Patient Stated Goals To return to walking around without as much pain. Be able to socialize with her friends.    Currently in Pain? Other (Comment)  More stiffness than pain during the day   Pain Onset More than a month ago      Sit to stands from black mat table x 10, x 12 (from lowest ) with e-stim ([place]) to quadricep at 57mA continuous   TRX sit to stands x 15 x 2 sets   Step Ups to plastic riser (1 step up was too difficult) with bilateral HHA x 2 sets x 12 repetitions on RLE only   TKEs x 12 repetitions with green t-band for 2 sets (quite challenging for her, cuing for full activation of quads)   Gait training -- able to ambulate with 1 HHA x 30' x 4 with step through pattern, cuing for less lateral trunk lean to rely on musculature rather than bony support. No increase in pain reported, reduced demand on HHA than in previous session(s).                            PT Education - 01/03/17 1313    Education provided Yes   Education Details Need to continue to work on quad strength for more independent ambulation.    Person(s)  Educated Patient   Methods Demonstration;Explanation;Handout   Comprehension Verbalized understanding;Returned demonstration             PT Long Term Goals - 12/14/16 1030      PT LONG TERM GOAL #1   Title Patient will improve ROM right knee flexion 0-100 degrees by 01/04/2017 to improve transition sit to stand and ambulate with improved gait pattern   Baseline right knee flexion 10-60   Status New     PT LONG TERM GOAL #2   Title Patient will improve ambulation with normal gait pattern and least restrictive AD by 01/11/17 in order to improve household and community ambulation   Baseline ambulating with forward wheeled walker with guarded posture and decreased hip/knee flexion   Status New     PT LONG TERM GOAL #3   Title Patient will demonstrate improved function with daily tasks as indicated by  LEFS score of 40/80 by 01/11/2017    Baseline LEFS to be assessed    Status New     PT LONG TERM GOAL #4   Title Patient will be independent with home program for pain control and ROM, strengthening exercises by discharge 01/11/2017 to allow patient to continue improvement with self management     Baseline limited knowledge of pain control strategies, appropriate exercises and progression and requires assistance, moderate cuing    Status New               Plan - 01/03/17 1359    Clinical Impression Statement Patient is demonstrating improving quadricep strength as demonstrated by decreased reliance on UE support during gait without device. She continues to have swelling throughout RLE, but has not been as limited by pain. She is progressing well, will continue to work towards ambulation without device next week.    Rehab Potential Good   Clinical Impairments Affecting Rehab Potential (+)acute condition right TKA, motivated, pror level of funciton(-)Diabetes, CLL, chronic condition of OA prior to surgery   PT Frequency Other (comment)  2-3x/week   PT Duration 4 weeks   PT Treatment/Interventions Electrical Stimulation;Cryotherapy;Gait training;Stair training;Patient/family education;Neuromuscular re-education;Balance training;Therapeutic exercise;Manual techniques;Scar mobilization   PT Next Visit Plan Manual therapy soft tissue techniques, therapeutic exercise, pain control   PT Home Exercise Plan ROM exercises for right knee, quad sets, glute sets, pain and swelling control   Consulted and Agree with Plan of Care Patient      Patient will benefit from skilled therapeutic intervention in order to improve the following deficits and impairments:  Decreased strength, Increased edema, Pain, Impaired perceived functional ability, Decreased activity tolerance, Difficulty walking, Decreased range of motion  Visit Diagnosis: Stiffness of right knee, not elsewhere classified  Difficulty in  walking, not elsewhere classified  Chronic pain of right knee     Problem List Patient Active Problem List   Diagnosis Date Noted  . CLL (chronic lymphocytic leukemia) ([place]) 07/24/2012  . PURE HYPERCHOLESTEROLEMIA 09/07/2009  . HYPOTHYROIDISM 07/29/2009  . 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 Essential hypertension 07/29/2009  . Asthma 07/29/2009  . OA (osteoarthritis) of knee 07/29/2009  . ELECTROCARDIOGRAM, ABNORMAL 07/29/2009   Royce Macadamia PT, DPT, CSCS     01/03/2017, 3:47 PM  [facility] [address] [place], [address] Phone: [phone]   Fax:  [phone]  Name: Maria [name] MRN: [id number] Date of Birth: 1951-03-20

## 2017-01-03 NOTE — Patient Instructions (Signed)
Sit to stands from black mat table x 10, x 12 (from lowest ) with e-stim ([place]) to quadricep at 62mA continuous   TRX sit to stands x 15 x 2 sets   Step Ups to plastic riser (1 step up was too difficult) with bilateral HHA x 2 sets x 12 repetitions on RLE only   TKEs x 12 repetitions with green t-band for 2 sets

## 2017-01-07 ENCOUNTER — Ambulatory Visit: Payer: BC Managed Care – PPO | Admitting: Physical Therapy

## 2017-01-07 ENCOUNTER — Encounter: Payer: BC Managed Care – PPO | Admitting: Physical Therapy

## 2017-01-07 DIAGNOSIS — R262 Difficulty in walking, not elsewhere classified: Secondary | ICD-10-CM

## 2017-01-07 DIAGNOSIS — G8929 Other chronic pain: Secondary | ICD-10-CM

## 2017-01-07 DIAGNOSIS — M25561 Pain in right knee: Secondary | ICD-10-CM | POA: Diagnosis not present

## 2017-01-07 DIAGNOSIS — M25661 Stiffness of right knee, not elsewhere classified: Secondary | ICD-10-CM

## 2017-01-07 NOTE — Therapy (Addendum)
[facility] [address] [place], [address] Phone: [phone]   Fax:  [phone]  Physical Therapy Treatment  Patient Details  Name: Maria [name] MRN: [id number] Date of Birth: May 08, 1951 Referring Provider: [name] MD  Encounter Date: 01/07/2017      PT End of Session - 01/07/17 1346    Visit Number 8   Number of Visits 12   Date for PT Re-Evaluation 01/11/17   PT Start Time 1300   PT Stop Time 1345   PT Time Calculation (min) 45 min   Activity Tolerance Patient tolerated treatment well   Behavior During Therapy [facility] for tasks assessed/performed      Past Medical History:  Diagnosis Date  . Allergy   . Arthritis   . Asthma   . Cancer [facility])    chronic lymphacytic leukemia  . Diabetes mellitus   . Hyperlipidemia   . Hypertension   . Hypothyroidism   . Thyroid disease    hypothyroidism    Past Surgical History:  Procedure Laterality Date  . ABDOMINAL HYSTERECTOMY    . LYMPH NODE DISSECTION    . STRABISMUS SURGERY    . TONSILLECTOMY    . TOTAL KNEE ARTHROPLASTY Right 12/03/2016   Procedure: RIGHT TOTAL KNEE ARTHROPLASTY;  Surgeon: [name], MD;  Location: [facility];  Service: Orthopedics;  Laterality: Right;    There were no vitals filed for this visit.      Subjective Assessment - 01/07/17 1301    Subjective Patient reports she reports some soreness after her HEP over the weekend. She thinks she is getting closer to walking wihtout a device.    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 She has a history of diabetes but has been well controlled. history of progressive knee pain x >10 years. underwent surgery for Right TKA 12/03/2016   Limitations Sitting;Standing;Walking;Lifting;House hold activities;Other (comment)  work related activties   Patient Stated Goals To return to  walking around without as much pain. Be able to socialize with her friends.    Currently in Pain? No/denies   Pain Onset More than a month ago      Initial knee flexion on step to 96 degrees   Leg Press - 65# x 8, 45# x 15, 45# x 15  Standing hip abductions - 3 sets x 8 repetitions (well tolerated)   Step Ups -- to wooden step initially, notable for trunkal flexion to compensate, opted for just a plastic riser, which resulted in decreased (though still present) trunkal flexion, increased quadricep output. 2 sets x 10 repetitions on RLE.   Edge tool to distal portion of quadriceps, focusing on medial and lateral quadriceps, well tolerated. Patient noted to have decreasing swelling and bruising throughout RLE.   ROM on 2nd step to 100-101 degrees   Long axis stretching with end range oscillations x 3 minutes, well tolerated with no report of increased pain.   TRX Sit to stands x 8 for 2 sets (challenging for her, notable for increased force delivered through her RLE).                            PT Education - 01/07/17 1346    Education provided Yes   Education Details ROM is continuing to improve and is over 100 on this date. Will  continue to work on quadricep and LE strengthening to progress ambulation. Discussed buying a cane to facilitate more reliance on LE for ambulation.    Person(s) Educated Patient   Methods Explanation;Demonstration   Comprehension Verbalized understanding;Returned demonstration             PT Long Term Goals - 12/14/16 1030      PT LONG TERM GOAL #1   Title Patient will improve ROM right knee flexion 0-100 degrees by 01/04/2017 to improve transition sit to stand and ambulate with improved gait pattern   Baseline right knee flexion 10-60   Status New     PT LONG TERM GOAL #2   Title Patient will improve ambulation with normal gait pattern and least restrictive AD by 01/11/17 in order to improve household and community ambulation    Baseline ambulating with forward wheeled walker with guarded posture and decreased hip/knee flexion   Status New     PT LONG TERM GOAL #3   Title Patient will demonstrate improved function with daily tasks as indicated by LEFS score of 40/80 by 01/11/2017    Baseline LEFS to be assessed    Status New     PT LONG TERM GOAL #4   Title Patient will be independent with home program for pain control and ROM, strengthening exercises by discharge 01/11/2017 to allow patient to continue improvement with self management     Baseline limited knowledge of pain control strategies, appropriate exercises and progression and requires assistance, moderate cuing    Status New               Plan - 01/07/17 1346    Clinical Impression Statement Patient demonstrates > 100 degrees of knee flexion to conclude this session. She continues to be limited in gait quality, primarily by weakness throughout RLE, causing her to rely on passive structures rather than muscular strength. She is progressing well, though will continue to require AD and strengthening for return to baseline mobility.    Rehab Potential Good   Clinical Impairments Affecting Rehab Potential (+)acute condition right TKA, motivated, pror level of funciton(-)Diabetes, CLL, chronic condition of OA prior to surgery   PT Frequency Other (comment)  2-3x/week   PT Duration 4 weeks   PT Treatment/Interventions Electrical Stimulation;Cryotherapy;Gait training;Stair training;Patient/family education;Neuromuscular re-education;Balance training;Therapeutic exercise;Manual techniques;Scar mobilization   PT Next Visit Plan Manual therapy soft tissue techniques, therapeutic exercise, pain control   PT Home Exercise Plan ROM exercises for right knee, quad sets, glute sets, pain and swelling control   Consulted and Agree with Plan of Care Patient      Patient will benefit from skilled therapeutic intervention in order to improve the following deficits and  impairments:  Decreased strength, Increased edema, Pain, Impaired perceived functional ability, Decreased activity tolerance, Difficulty walking, Decreased range of motion  Visit Diagnosis: Stiffness of right knee, not elsewhere classified  Difficulty in walking, not elsewhere classified  Chronic pain of right knee     Problem List Patient Active Problem List   Diagnosis Date Noted  . CLL (chronic lymphocytic leukemia) ([place]) 07/24/2012  . PURE HYPERCHOLESTEROLEMIA 09/07/2009  . HYPOTHYROIDISM 07/29/2009  . 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 Essential hypertension 07/29/2009  . Asthma 07/29/2009  . OA (osteoarthritis) of knee 07/29/2009  . ELECTROCARDIOGRAM, ABNORMAL 07/29/2009   Royce Macadamia PT, DPT, CSCS    01/08/2017, 9:07 AM  [facility] [address] [place],  [place], [address] Phone: [phone]   Fax:  [phone]  Name: Maria [name] MRN: [id number] Date of Birth: 03-Apr-1951

## 2017-01-07 NOTE — Patient Instructions (Signed)
Initial knee flexion on step to 96 degrees   Leg Press - 65# x 8, 45# x 15, 45# x   Standing hip abductions - 3 sets x 8 repetitions (well tolerated)   Step Ups   Edge tool   ROM on 2nd step to 100-101 degrees   Long axis stretching with end range oscillations   TRX Sit to stands x 8 for 2 sets

## 2017-01-10 ENCOUNTER — Ambulatory Visit: Payer: BC Managed Care – PPO | Attending: Orthopedic Surgery | Admitting: Physical Therapy

## 2017-01-10 DIAGNOSIS — M25561 Pain in right knee: Secondary | ICD-10-CM | POA: Insufficient documentation

## 2017-01-10 DIAGNOSIS — M25661 Stiffness of right knee, not elsewhere classified: Secondary | ICD-10-CM | POA: Diagnosis not present

## 2017-01-10 DIAGNOSIS — R262 Difficulty in walking, not elsewhere classified: Secondary | ICD-10-CM | POA: Insufficient documentation

## 2017-01-10 DIAGNOSIS — M6281 Muscle weakness (generalized): Secondary | ICD-10-CM | POA: Insufficient documentation

## 2017-01-10 DIAGNOSIS — G8929 Other chronic pain: Secondary | ICD-10-CM | POA: Diagnosis present

## 2017-01-10 NOTE — Therapy (Signed)
[facility] [address], [place], [address] Phone: [phone]   Fax:  [phone]  Physical Therapy Treatment  Patient Details  Name: Maria [name] MRN: [id number] Date of Birth: May 24, 1951 Referring Provider: [name] MD  Encounter Date: 01/10/2017      PT End of Session - 01/10/17 1530    Visit Number 9   Number of Visits 20   Date for PT Re-Evaluation 02/21/17   PT Start Time 1430   PT Stop Time 1515   PT Time Calculation (min) 45 min   Activity Tolerance Patient tolerated treatment well   Behavior During Therapy [facility] for tasks assessed/performed      Past Medical History:  Diagnosis Date  . Allergy   . Arthritis   . Asthma   . Cancer [facility])    chronic lymphacytic leukemia  . Diabetes mellitus   . Hyperlipidemia   . Hypertension   . Hypothyroidism   . Thyroid disease    hypothyroidism    Past Surgical History:  Procedure Laterality Date  . ABDOMINAL HYSTERECTOMY    . LYMPH NODE DISSECTION    . STRABISMUS SURGERY    . TONSILLECTOMY    . TOTAL KNEE ARTHROPLASTY Right 12/03/2016   Procedure: RIGHT TOTAL KNEE ARTHROPLASTY;  Surgeon: [name], MD;  Location: [facility];  Service: Orthopedics;  Laterality: Right;    There were no vitals filed for this visit.      Subjective Assessment - 01/10/17 1430    Subjective Patient reports she continues to have some stiffness around the knee, but pain has largely subsided. She was able to drive up and down her driveway earlier today.    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 She has a history of diabetes but has been well controlled. history of progressive knee pain x >10 years. underwent surgery for Right TKA 12/03/2016   Limitations Sitting;Standing;Walking;Lifting;House hold activities;Other (comment)  work related activties   Patient Stated  Goals To return to walking around without as much pain. Be able to socialize with her friends.    Currently in Pain? No/denies      In sitting passive range to 92 degrees   Seated knee flexion with overpressure x 5 minutes -- on 2nd step lunge to 96 degrees after seated overpressure flexion stretching   95 degrees after performing calf stretching on angled tilt board x 3 sets x 30"   Step ups with TRX, too unstable. With hands on railing to plastic riser, improving quad contraction but continues to use trunk flexion and bony lock 3 sets x 10 repetitions.   TRX sit to stands x 12 x 3 sets (improved quad contraction noted)    Long arc quads with 5# weight for 5 minutes with [place] stim to intermedius and medialis on quad 10" on and 10" off                            PT Education - 01/10/17 1529    Education provided Yes   Education Details Slight regression in ROM noted, needs to continue to progress with quad strengthening over the weekend to ambulate without device.    Person(s) Educated Patient   Methods Explanation;Demonstration;Handout   Comprehension Returned demonstration;Verbalized understanding             PT  Long Term Goals - 12/14/16 1030      PT LONG TERM GOAL #1   Title Patient will improve ROM right knee flexion 0-100 degrees by 01/04/2017 to improve transition sit to stand and ambulate with improved gait pattern   Baseline right knee flexion 10-60   Status New     PT LONG TERM GOAL #2   Title Patient will improve ambulation with normal gait pattern and least restrictive AD by 01/11/17 in order to improve household and community ambulation   Baseline ambulating with forward wheeled walker with guarded posture and decreased hip/knee flexion   Status New     PT LONG TERM GOAL #3   Title Patient will demonstrate improved function with daily tasks as indicated by LEFS score of 40/80 by 01/11/2017    Baseline LEFS to be assessed    Status New      PT LONG TERM GOAL #4   Title Patient will be independent with home program for pain control and ROM, strengthening exercises by discharge 01/11/2017 to allow patient to continue improvement with self management     Baseline limited knowledge of pain control strategies, appropriate exercises and progression and requires assistance, moderate cuing    Status New               Plan - 01/10/17 1531    Clinical Impression Statement Patient had minor regression in ROM at the knee joint (to 96 degrees of flexion). Her quadricep function/strength is still sub-optimal and thus provided HEP to address over the weekend for ability to ambulate without device. She is progressing well with pain and ROM into extension. Her gait pattern is normalizing and speed is increasing as well.    Rehab Potential Good   Clinical Impairments Affecting Rehab Potential (+)acute condition right TKA, motivated, pror level of funciton(-)Diabetes, CLL, chronic condition of OA prior to surgery   PT Frequency Other (comment)  2-3x/week   PT Duration 4 weeks   PT Treatment/Interventions Electrical Stimulation;Cryotherapy;Gait training;Stair training;Patient/family education;Neuromuscular re-education;Balance training;Therapeutic exercise;Manual techniques;Scar mobilization   PT Next Visit Plan Manual therapy soft tissue techniques, therapeutic exercise, pain control   PT Home Exercise Plan ROM exercises for right knee, quad sets, glute sets, pain and swelling control   Consulted and Agree with Plan of Care Patient      Patient will benefit from skilled therapeutic intervention in order to improve the following deficits and impairments:  Decreased strength, Increased edema, Pain, Impaired perceived functional ability, Decreased activity tolerance, Difficulty walking, Decreased range of motion  Visit Diagnosis: Stiffness of right knee, not elsewhere classified  Difficulty in walking, not elsewhere classified  Chronic  pain of right knee     Problem List Patient Active Problem List   Diagnosis Date Noted  . CLL (chronic lymphocytic leukemia) ([place]) 07/24/2012  . PURE HYPERCHOLESTEROLEMIA 09/07/2009  . HYPOTHYROIDISM 07/29/2009  . 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 Essential hypertension 07/29/2009  . Asthma 07/29/2009  . OA (osteoarthritis) of knee 07/29/2009  . ELECTROCARDIOGRAM, ABNORMAL 07/29/2009   Royce Macadamia PT, DPT, CSCS    01/10/2017, 3:33 PM  [facility] [address] [place], [address] Phone: [phone]   Fax:  [phone]  Name: Maria [name] MRN: [id number] Date of Birth: [birth year]

## 2017-01-10 NOTE — Patient Instructions (Signed)
In sitting passive range to 92 degrees   Seated knee flexion   95 degrees after performing calf stretching on angled tilt board x 3 sets x 30"   TRX sit to stands x 12

## 2017-01-15 ENCOUNTER — Encounter: Payer: Self-pay | Admitting: Physical Therapy

## 2017-01-15 ENCOUNTER — Ambulatory Visit: Payer: BC Managed Care – PPO | Admitting: Physical Therapy

## 2017-01-15 DIAGNOSIS — M25661 Stiffness of right knee, not elsewhere classified: Secondary | ICD-10-CM | POA: Diagnosis not present

## 2017-01-15 DIAGNOSIS — M25561 Pain in right knee: Secondary | ICD-10-CM

## 2017-01-15 DIAGNOSIS — R262 Difficulty in walking, not elsewhere classified: Secondary | ICD-10-CM

## 2017-01-15 DIAGNOSIS — M6281 Muscle weakness (generalized): Secondary | ICD-10-CM

## 2017-01-15 DIAGNOSIS — G8929 Other chronic pain: Secondary | ICD-10-CM

## 2017-01-15 NOTE — Therapy (Signed)
[place] [facility] [address], [place], [address] Phone: [phone]   Fax:  [phone]  Physical Therapy Treatment  Patient Details  Name: Maria [name] MRN: [id number] Date of Birth: 06-Mar-1951 Referring Provider: [name] MD  Encounter Date: 01/15/2017      PT End of Session - 01/15/17 1255    Visit Number 10   Number of Visits 20   Date for PT Re-Evaluation 02/21/17   PT Start Time 7253   PT Stop Time 1344   PT Time Calculation (min) 49 min   Activity Tolerance Patient tolerated treatment well   Behavior During Therapy [facility] for tasks assessed/performed      Past Medical History:  Diagnosis Date  . Allergy   . Arthritis   . Asthma   . Cancer [facility])    chronic lymphacytic leukemia  . Diabetes mellitus   . Hyperlipidemia   . Hypertension   . Hypothyroidism   . Thyroid disease    hypothyroidism    Past Surgical History:  Procedure Laterality Date  . ABDOMINAL HYSTERECTOMY    . LYMPH NODE DISSECTION    . STRABISMUS SURGERY    . TONSILLECTOMY    . TOTAL KNEE ARTHROPLASTY Right 12/03/2016   Procedure: RIGHT TOTAL KNEE ARTHROPLASTY;  Surgeon: [name], MD;  Location: [facility];  Service: Orthopedics;  Laterality: Right;    There were no vitals filed for this visit.      Subjective Assessment - 01/15/17 1259    Subjective Pt reports she saw her orthopedic MD yesterday and MD suggested transitioning to cane so pt has brought her [facility] into PT session today.  Pt took her pain medication at noon before session.     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 She has a history of diabetes but has been well controlled. history of progressive knee pain x >10 years. underwent surgery for Right TKA 12/03/2016   Limitations Sitting;Standing;Walking;Lifting;House hold activities;Other (comment)  work related  activties   Patient Stated Goals To return to walking around without as much pain. Be able to socialize with her friends.    Currently in Pain? No/denies   Multiple Pain Sites No        AAROM seated R knee flexion with manual overpressure with 10 second holds x5  AAROM R knee flexion: 94 deg   Gait assessment with RW: Dec R DF, dec stance time on the R, dec step length on the L. Pt unable to keep RW moving and pauses during R stance phase.   Gait assessment with SPC: Dec step length Bil (L less than R), dec weight shift to R, pt relies heavily on WBing through cane on L. Pt looks down at floor.   Gait training with SPC: Cues for forward gaze and increased weight shift to RLE. Encouraged pt to begin ambulating in hallway at home with [facility] while focusing on these two cues.   Gait training with RW: cues for dec WBing through [place] and to keep RW moving. Cues for heel strike.   BLE leg press 45# 3x10 with cues for eccentric control   BLE total gym squats at level 16 2x10. Cues to keep knees apart as knee valgus BLE initially           PT Education - 01/15/17 1255    Education provided Yes   Education  Details Exercise technique   Person(s) Educated Patient   Methods Explanation;Demonstration;Verbal cues   Comprehension Verbalized understanding;Returned demonstration;Need further instruction             PT Long Term Goals - 12/14/16 1030      PT LONG TERM GOAL #1   Title Patient will improve ROM right knee flexion 0-100 degrees by 01/04/2017 to improve transition sit to stand and ambulate with improved gait pattern   Baseline right knee flexion 10-60   Status New     PT LONG TERM GOAL #2   Title Patient will improve ambulation with normal gait pattern and least restrictive AD by 01/11/17 in order to improve household and community ambulation   Baseline ambulating with forward wheeled walker with guarded posture and decreased hip/knee flexion   Status New     PT LONG TERM  GOAL #3   Title Patient will demonstrate improved function with daily tasks as indicated by LEFS score of 40/80 by 01/11/2017    Baseline LEFS to be assessed    Status New     PT LONG TERM GOAL #4   Title Patient will be independent with home program for pain control and ROM, strengthening exercises by discharge 01/11/2017 to allow patient to continue improvement with self management     Baseline limited knowledge of pain control strategies, appropriate exercises and progression and requires assistance, moderate cuing    Status New               Plan - 01/15/17 1331    Clinical Impression Statement Pt presents with limitations in R knee flexion AROM but was able to achieve 94 deg R knee flexion with AAROM exercise.  Gait assessment with SPC and RW was completed and provided cues to the pt for improved mechanics and safety.  Encouraged pt to begin gait training at home with [facility] in controlled environment of hallway with the goal to eventually transition completely to using only SPC as determined at future sessions.  She tolerated all strengthening exercises well.  She will benefit from continued skilled PT interventions for improved ROM, strength, gait mechanics, and QOL.   Rehab Potential Good   Clinical Impairments Affecting Rehab Potential (+)acute condition right TKA, motivated, pror level of funciton(-)Diabetes, CLL, chronic condition of OA prior to surgery   PT Frequency Other (comment)  2-3x/week   PT Duration 4 weeks   PT Treatment/Interventions Electrical Stimulation;Cryotherapy;Gait training;Stair training;Patient/family education;Neuromuscular re-education;Balance training;Therapeutic exercise;Manual techniques;Scar mobilization   PT Next Visit Plan Manual therapy soft tissue techniques, therapeutic exercise, pain control   PT Home Exercise Plan ROM exercises for right knee, quad sets, glute sets, pain and swelling control   Consulted and Agree with Plan of Care Patient       Patient will benefit from skilled therapeutic intervention in order to improve the following deficits and impairments:  Decreased strength, Increased edema, Pain, Impaired perceived functional ability, Decreased activity tolerance, Difficulty walking, Decreased range of motion  Visit Diagnosis: Stiffness of right knee, not elsewhere classified  Difficulty in walking, not elsewhere classified  Chronic pain of right knee  Muscle weakness (generalized)     Problem List Patient Active Problem List   Diagnosis Date Noted  . CLL (chronic lymphocytic leukemia) ([place]) 07/24/2012  . PURE HYPERCHOLESTEROLEMIA 09/07/2009  . HYPOTHYROIDISM 07/29/2009  . 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 Essential hypertension 07/29/2009  . Asthma 07/29/2009  . OA (osteoarthritis) of knee 07/29/2009  . ELECTROCARDIOGRAM, ABNORMAL  07/29/2009    Collie Siad PT, DPT 01/15/2017, 1:46 PM  [place] [facility] [address], [place], [address] Phone: [phone]   Fax:  [phone]  Name: QUANTAVIA FRITH MRN: [id number] Date of Birth: 1951/08/17

## 2017-01-17 ENCOUNTER — Ambulatory Visit: Payer: BC Managed Care – PPO | Admitting: Physical Therapy

## 2017-01-17 DIAGNOSIS — G8929 Other chronic pain: Secondary | ICD-10-CM

## 2017-01-17 DIAGNOSIS — M25661 Stiffness of right knee, not elsewhere classified: Secondary | ICD-10-CM | POA: Diagnosis not present

## 2017-01-17 DIAGNOSIS — M25561 Pain in right knee: Secondary | ICD-10-CM

## 2017-01-17 DIAGNOSIS — R262 Difficulty in walking, not elsewhere classified: Secondary | ICD-10-CM

## 2017-01-17 NOTE — Patient Instructions (Signed)
PROM to 100 and after scar massage to 102  Step ups to plastic step then to 1 riser -- 3 sets x 12 repetitions on RLE only with HHA (cuing to maintain upright posture).    Leg Press 55# x 12, 75# x 15, 95# x 6  Gait analysis -- continues to demonstrate trendelenburg, lateral trunk flexion and increased knee flexion on RLE during stance phase indicative of quadriceps and hip abductor weakness, though improving (likely also related to previous gait pattern and decreased calf ROM).   Staggered TRX sit to stands x 12 for 3 sets (unable to complete single leg eccentrics)   Total gym level 17 single leg squats x 8 for 2 sets  Standing calf stretch on incline board x 30" for 2 bouts

## 2017-01-18 NOTE — Therapy (Signed)
[place] [facility] [address], [place], [address] Phone: [phone]   Fax:  [phone]  Physical Therapy Treatment  Patient Details  Name: Maria [name] MRN: [id number] Date of Birth: 1950/11/09 Referring Provider: [name] MD  Encounter Date: 01/17/2017      PT End of Session - 01/17/17 1548    Visit Number 11   Number of Visits 20   Date for PT Re-Evaluation 02/21/17   PT Start Time 1500   PT Stop Time 1545   PT Time Calculation (min) 45 min   Activity Tolerance Patient tolerated treatment well   Behavior During Therapy [facility] for tasks assessed/performed      Past Medical History:  Diagnosis Date  . Allergy   . Arthritis   . Asthma   . Cancer [facility])    chronic lymphacytic leukemia  . Diabetes mellitus   . Hyperlipidemia   . Hypertension   . Hypothyroidism   . Thyroid disease    hypothyroidism    Past Surgical History:  Procedure Laterality Date  . ABDOMINAL HYSTERECTOMY    . LYMPH NODE DISSECTION    . STRABISMUS SURGERY    . TONSILLECTOMY    . TOTAL KNEE ARTHROPLASTY Right 12/03/2016   Procedure: RIGHT TOTAL KNEE ARTHROPLASTY;  Surgeon: [name], MD;  [facility];  Service: Orthopedics;  Laterality: Right;    There were no vitals filed for this visit.      Subjective Assessment - 01/17/17 1504    Subjective Patient reports she is using the cane at home and in the community. No falls reported.    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 She has a history of diabetes but has been well controlled. history of progressive knee pain x >10 years. underwent surgery for Right TKA 12/03/2016   Limitations Sitting;Standing;Walking;Lifting;House hold activities;Other (comment)  work related activties   Patient Stated Goals To return to walking around without as much pain. Be able to  socialize with her friends.    Currently in Pain? Other (Comment)  More stiffness than pain currently        PROM to 100 and after scar massage to 102  Step ups to plastic step then to 1 riser -- 3 sets x 12 repetitions on RLE only with HHA (cuing to maintain upright posture).    Leg Press 55# x 12, 75# x 15, 95# x 6  Gait analysis -- continues to demonstrate trendelenburg, lateral trunk flexion and increased knee flexion on RLE during stance phase indicative of quadriceps and hip abductor weakness, though improving (likely also related to previous gait pattern and decreased calf ROM).   Staggered TRX sit to stands x 12 for 3 sets (unable to complete single leg eccentrics)   Total gym level 17 single leg squats x 8 for 2 sets  Standing calf stretch on incline board x 30" for 2 bouts                          PT Education - 01/17/17 1548    Education provided Yes   Education Details Need to progress with LE strengthening to allow for more normalized gait pattern.    Person(s) Educated Patient   Methods Explanation;Demonstration   Comprehension Verbalized understanding;Returned demonstration  PT Long Term Goals - 12/14/16 1030      PT LONG TERM GOAL #1   Title Patient will improve ROM right knee flexion 0-100 degrees by 01/04/2017 to improve transition sit to stand and ambulate with improved gait pattern   Baseline right knee flexion 10-60   Status New     PT LONG TERM GOAL #2   Title Patient will improve ambulation with normal gait pattern and least restrictive AD by 01/11/17 in order to improve household and community ambulation   Baseline ambulating with forward wheeled walker with guarded posture and decreased hip/knee flexion   Status New     PT LONG TERM GOAL #3   Title Patient will demonstrate improved function with daily tasks as indicated by LEFS score of 40/80 by 01/11/2017    Baseline LEFS to be assessed    Status New     PT LONG  TERM GOAL #4   Title Patient will be independent with home program for pain control and ROM, strengthening exercises by discharge 01/11/2017 to allow patient to continue improvement with self management     Baseline limited knowledge of pain control strategies, appropriate exercises and progression and requires assistance, moderate cuing    Status New               Plan - 01/17/17 1549    Clinical Impression Statement Patient continues to demonstrate improvement in quadriceps and hip strength in resistance exercises, she is demonstrating less trendelenburg and lateral compensation with trunk lean. She has transitioned to a cane, however to transition to no AD she will need to improve quadriceps outrput.    Rehab Potential Good   Clinical Impairments Affecting Rehab Potential (+)acute condition right TKA, motivated, pror level of funciton(-)Diabetes, CLL, chronic condition of OA prior to surgery   PT Frequency Other (comment)  2-3x/week   PT Duration 4 weeks   PT Treatment/Interventions Electrical Stimulation;Cryotherapy;Gait training;Stair training;Patient/family education;Neuromuscular re-education;Balance training;Therapeutic exercise;Manual techniques;Scar mobilization   PT Next Visit Plan Manual therapy soft tissue techniques, therapeutic exercise, pain control   PT Home Exercise Plan ROM exercises for right knee, quad sets, glute sets, pain and swelling control   Consulted and Agree with Plan of Care Patient      Patient will benefit from skilled therapeutic intervention in order to improve the following deficits and impairments:  Decreased strength, Increased edema, Pain, Impaired perceived functional ability, Decreased activity tolerance, Difficulty walking, Decreased range of motion  Visit Diagnosis: Stiffness of right knee, not elsewhere classified  Difficulty in walking, not elsewhere classified  Chronic pain of right knee     Problem List Patient Active Problem List    Diagnosis Date Noted  . CLL (chronic lymphocytic leukemia) ([place]) 07/24/2012  . PURE HYPERCHOLESTEROLEMIA 09/07/2009  . HYPOTHYROIDISM 07/29/2009  . 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 Essential hypertension 07/29/2009  . Asthma 07/29/2009  . OA (osteoarthritis) of knee 07/29/2009  . ELECTROCARDIOGRAM, ABNORMAL 07/29/2009   Royce Macadamia PT, DPT, CSCS    01/18/2017, 9:53 AM  [place] [facility] [address] [place], [address] Phone: [phone]   Fax:  [phone]  Name: Maria [name] MRN: [id number] Date of Birth: Sep 06, 1951

## 2017-01-22 ENCOUNTER — Ambulatory Visit: Payer: BC Managed Care – PPO | Admitting: Physical Therapy

## 2017-01-22 DIAGNOSIS — M25561 Pain in right knee: Secondary | ICD-10-CM

## 2017-01-22 DIAGNOSIS — R262 Difficulty in walking, not elsewhere classified: Secondary | ICD-10-CM

## 2017-01-22 DIAGNOSIS — G8929 Other chronic pain: Secondary | ICD-10-CM

## 2017-01-22 DIAGNOSIS — M25661 Stiffness of right knee, not elsewhere classified: Secondary | ICD-10-CM | POA: Diagnosis not present

## 2017-01-22 NOTE — Patient Instructions (Signed)
Double leg squats on totoal gym at level 24 x 15  Single leg squats on total gym at level 17 - 3 sets of double leg concentric and single leg eccentric for 8 repetitions   Step ups x 10 to plastic riser with 1 step 3 sets x 10 repetitions with bilateral HHA (improving quadricep output with minimal trunk flexion until final few repetitions) -- removed riser for last set due to increased medial deviation of R knee.   Sit to stands from chair with 2 pillows x8 repetitions x 2 sets without use of UEs.

## 2017-01-24 ENCOUNTER — Ambulatory Visit: Payer: BC Managed Care – PPO | Admitting: Physical Therapy

## 2017-01-24 DIAGNOSIS — M25661 Stiffness of right knee, not elsewhere classified: Secondary | ICD-10-CM

## 2017-01-24 DIAGNOSIS — G8929 Other chronic pain: Secondary | ICD-10-CM

## 2017-01-24 DIAGNOSIS — M25561 Pain in right knee: Secondary | ICD-10-CM

## 2017-01-24 DIAGNOSIS — R262 Difficulty in walking, not elsewhere classified: Secondary | ICD-10-CM

## 2017-01-24 NOTE — Patient Instructions (Signed)
5cm difference in joint line swelling between  Total Gym level 26 3 x15  TKEs   Leg Extensions   Step Ups to 1 step

## 2017-01-24 NOTE — Therapy (Signed)
[place] [facility] [address], [place], [address] Phone: [phone]   Fax:  [phone]  Physical Therapy Treatment  Patient Details  Name: Maria [name] MRN: [id number] Date of Birth: 1951/08/09 Referring Provider: [name] MD  Encounter Date: 01/22/2017      PT End of Session - 01/24/17 1344    Visit Number 12   Number of Visits 20   Date for PT Re-Evaluation 02/21/17   PT Start Time 1300   PT Stop Time 1345   PT Time Calculation (min) 45 min   Activity Tolerance Patient tolerated treatment well   Behavior During Therapy [facility] for tasks assessed/performed      Past Medical History:  Diagnosis Date  . Allergy   . Arthritis   . Asthma   . Cancer [facility])    chronic lymphacytic leukemia  . Diabetes mellitus   . Hyperlipidemia   . Hypertension   . Hypothyroidism   . Thyroid disease    hypothyroidism    Past Surgical History:  Procedure Laterality Date  . ABDOMINAL HYSTERECTOMY    . LYMPH NODE DISSECTION    . STRABISMUS SURGERY    . TONSILLECTOMY    . TOTAL KNEE ARTHROPLASTY Right 12/03/2016   Procedure: RIGHT TOTAL KNEE ARTHROPLASTY;  Surgeon: [name], MD;  [facility];  Service: Orthopedics;  Laterality: Right;    There were no vitals filed for this visit.      Subjective Assessment - 01/24/17 1343    Subjective Patient reports she has fully transitioned to a cane, is still having relative weakness on her RLE, but has had this for some time.    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 She has a history of diabetes but has been well controlled. history of progressive knee pain x >10 years. underwent surgery for Right TKA 12/03/2016   Limitations Sitting;Standing;Walking;Lifting;House hold activities;Other (comment)  work related activties   Patient Stated Goals To return to walking  around without as much pain. Be able to socialize with her friends.    Currently in Pain? Other (Comment)  More stiffness/weakness than pain in her R knee      Double leg squats on totoal gym at level 24 x 15  Single leg squats on total gym at level 17 - 3 sets of double leg concentric and single leg eccentric for 8 repetitions   Step ups x 10 to plastic riser with 1 step 3 sets x 10 repetitions with bilateral HHA (improving quadricep output with minimal trunk flexion until final few repetitions) -- removed riser for last set due to increased medial deviation of R knee.   Sit to stands from chair with 2 pillows x8 repetitions x 2 sets without use of UEs.                            PT Education - 01/24/17 1344    Education provided Yes   Education Details Progress with HEP to build more strength in RLE for less Trendelenburg/weight shift in gait.    Person(s) Educated Patient   Methods Explanation;Demonstration;Handout   Comprehension Verbalized understanding;Returned demonstration             PT Long Term Goals - 12/14/16 1030      PT LONG TERM GOAL #1   Title Patient  will improve ROM right knee flexion 0-100 degrees by 01/04/2017 to improve transition sit to stand and ambulate with improved gait pattern   Baseline right knee flexion 10-60   Status New     PT LONG TERM GOAL #2   Title Patient will improve ambulation with normal gait pattern and least restrictive AD by 01/11/17 in order to improve household and community ambulation   Baseline ambulating with forward wheeled walker with guarded posture and decreased hip/knee flexion   Status New     PT LONG TERM GOAL #3   Title Patient will demonstrate improved function with daily tasks as indicated by LEFS score of 40/80 by 01/11/2017    Baseline LEFS to be assessed    Status New     PT LONG TERM GOAL #4   Title Patient will be independent with home program for pain control and ROM, strengthening  exercises by discharge 01/11/2017 to allow patient to continue improvement with self management     Baseline limited knowledge of pain control strategies, appropriate exercises and progression and requires assistance, moderate cuing    Status New               Plan - 01/24/17 1344    Clinical Impression Statement Patient has been gradually building up RLE strength, though still not sufficient to ambulate without trunkal compensations. Her ROM and swelling are both improving and she has little to no bruising in her thigh now. She is progressing well towards established mobility goals, though will continue to require dedicated strengthening for return to prior level of function.    Rehab Potential Good   Clinical Impairments Affecting Rehab Potential (+)acute condition right TKA, motivated, pror level of funciton(-)Diabetes, CLL, chronic condition of OA prior to surgery   PT Frequency Other (comment)  2-3x/week   PT Duration 4 weeks   PT Treatment/Interventions Electrical Stimulation;Cryotherapy;Gait training;Stair training;Patient/family education;Neuromuscular re-education;Balance training;Therapeutic exercise;Manual techniques;Scar mobilization   PT Next Visit Plan Manual therapy soft tissue techniques, therapeutic exercise, pain control   PT Home Exercise Plan ROM exercises for right knee, quad sets, glute sets, pain and swelling control   Consulted and Agree with Plan of Care Patient      Patient will benefit from skilled therapeutic intervention in order to improve the following deficits and impairments:  Decreased strength, Increased edema, Pain, Impaired perceived functional ability, Decreased activity tolerance, Difficulty walking, Decreased range of motion  Visit Diagnosis: Stiffness of right knee, not elsewhere classified  Difficulty in walking, not elsewhere classified  Chronic pain of right knee     Problem List Patient Active Problem List   Diagnosis Date Noted  .  CLL (chronic lymphocytic leukemia) ([place]) 07/24/2012  . PURE HYPERCHOLESTEROLEMIA 09/07/2009  . HYPOTHYROIDISM 07/29/2009  . 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 Essential hypertension 07/29/2009  . Asthma 07/29/2009  . OA (osteoarthritis) of knee 07/29/2009  . ELECTROCARDIOGRAM, ABNORMAL 07/29/2009    Royce Macadamia PT, DPT, CSCS    01/24/2017, 1:47 PM  [place] [facility] [address] [place], [address] Phone: [phone]   Fax:  [phone]  Name: KRYSLYN HELBIG MRN: [id number] Date of Birth: Dec 05, 1950

## 2017-01-25 ENCOUNTER — Ambulatory Visit: Payer: BC Managed Care – PPO | Admitting: Physical Therapy

## 2017-01-28 ENCOUNTER — Ambulatory Visit: Payer: BC Managed Care – PPO | Admitting: Physical Therapy

## 2017-01-28 DIAGNOSIS — G8929 Other chronic pain: Secondary | ICD-10-CM

## 2017-01-28 DIAGNOSIS — M25661 Stiffness of right knee, not elsewhere classified: Secondary | ICD-10-CM | POA: Diagnosis not present

## 2017-01-28 DIAGNOSIS — M25561 Pain in right knee: Secondary | ICD-10-CM

## 2017-01-28 DIAGNOSIS — R262 Difficulty in walking, not elsewhere classified: Secondary | ICD-10-CM

## 2017-01-28 NOTE — Therapy (Signed)
[place] [facility] [address] [place], [address] Phone: [phone]   Fax:  [phone]  Physical Therapy Treatment  Patient Details  Name: Maria [name] MRN: [id number] Date of Birth: [birth year] Referring Provider: [name] MD  Encounter Date: 01/28/2017      PT End of Session - 01/28/17 0903    Visit Number 14   Number of Visits 20   Date for PT Re-Evaluation 02/21/17   PT Start Time 2025   PT Stop Time 0930   PT Time Calculation (min) 43 min   Activity Tolerance Patient tolerated treatment well   Behavior During Therapy [facility] for tasks assessed/performed      Past Medical History:  Diagnosis Date  . Allergy   . Arthritis   . Asthma   . Cancer [facility])    chronic lymphacytic leukemia  . Diabetes mellitus   . Hyperlipidemia   . Hypertension   . Hypothyroidism   . Thyroid disease    hypothyroidism    Past Surgical History:  Procedure Laterality Date  . ABDOMINAL HYSTERECTOMY    . LYMPH NODE DISSECTION    . STRABISMUS SURGERY    . TONSILLECTOMY    . TOTAL KNEE ARTHROPLASTY Right 12/03/2016   Procedure: RIGHT TOTAL KNEE ARTHROPLASTY;  Surgeon: [name], MD;  Location: [facility];  Service: Orthopedics;  Laterality: Right;    There were no vitals filed for this visit.      Subjective Assessment - 01/28/17 0850    Subjective Patient reports her ankle/RLE swelling subsided, though she was not able to go to the concert on Friday due to concern over ankle swelling. Reports her knee feels much more sturdy.    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 She has a history of diabetes but has been well controlled. history of progressive knee pain x >10 years. underwent surgery for Right TKA 12/03/2016   Limitations Sitting;Standing;Walking;Lifting;House hold activities;Other (comment)  work related  activties   Patient Stated Goals To return to walking around without as much pain. Be able to socialize with her friends.    Currently in Pain? No/denies        Leg Press - 55# x 12, 75# x12 for 3 sets   Leg extensions - 15# for 12, 20# x 10 for 2 sets   TRX sit to stands x 15   Sit to stands (unable to complete without use of UEs) x 8 with use of UEs on the way up, controlling eccentric portion for 2 sets (much improved control on descent with cuing)   Step ups (lateral) with minimal HHA x 10 for 2 sets -- appropriate mechanics noted, able to go through flexion/extension at the knee.   Gait training with water cup for bio-feedback for antalgic "limping" pattern correction.                           PT Education - 01/28/17 0903    Education provided Yes   Education Details Updated HEP    Person(s) Educated Patient   Methods Explanation;Demonstration   Comprehension Verbalized understanding;Returned demonstration             PT Long Term Goals - 12/14/16 1030      PT LONG TERM GOAL #1   Title Patient will improve ROM right knee flexion 0-100  degrees by 01/04/2017 to improve transition sit to stand and ambulate with improved gait pattern   Baseline right knee flexion 10-60   Status New     PT LONG TERM GOAL #2   Title Patient will improve ambulation with normal gait pattern and least restrictive AD by 01/11/17 in order to improve household and community ambulation   Baseline ambulating with forward wheeled walker with guarded posture and decreased hip/knee flexion   Status New     PT LONG TERM GOAL #3   Title Patient will demonstrate improved function with daily tasks as indicated by LEFS score of 40/80 by 01/11/2017    Baseline LEFS to be assessed    Status New     PT LONG TERM GOAL #4   Title Patient will be independent with home program for pain control and ROM, strengthening exercises by discharge 01/11/2017 to allow patient to continue improvement  with self management     Baseline limited knowledge of pain control strategies, appropriate exercises and progression and requires assistance, moderate cuing    Status New               Plan - 01/28/17 0903    Clinical Impression Statement Patient is progressing nicely with gait training and quad firing/strength as demonstrated by appropriate mechanics with step ups this date. She continues to have antalgic/limping gait pattern at times, though able to correct with biofeedback and cuing. She has managed her pain well, now patient would benefit from continued strengthening and gait training to return to prior level of function.    Rehab Potential Good   Clinical Impairments Affecting Rehab Potential (+)acute condition right TKA, motivated, pror level of funciton(-)Diabetes, CLL, chronic condition of OA prior to surgery   PT Frequency Other (comment)  2-3x/week   PT Duration 4 weeks   PT Treatment/Interventions Electrical Stimulation;Cryotherapy;Gait training;Stair training;Patient/family education;Neuromuscular re-education;Balance training;Therapeutic exercise;Manual techniques;Scar mobilization   PT Next Visit Plan Manual therapy soft tissue techniques, therapeutic exercise, pain control   PT Home Exercise Plan ROM exercises for right knee, quad sets, glute sets, pain and swelling control   Consulted and Agree with Plan of Care Patient      Patient will benefit from skilled therapeutic intervention in order to improve the following deficits and impairments:  Decreased strength, Increased edema, Pain, Impaired perceived functional ability, Decreased activity tolerance, Difficulty walking, Decreased range of motion  Visit Diagnosis: Stiffness of right knee, not elsewhere classified  Difficulty in walking, not elsewhere classified  Chronic pain of right knee     Problem List Patient Active Problem List   Diagnosis Date Noted  . CLL (chronic lymphocytic leukemia) ([place])  07/24/2012  . PURE HYPERCHOLESTEROLEMIA 09/07/2009  . HYPOTHYROIDISM 07/29/2009  . 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 Essential hypertension 07/29/2009  . Asthma 07/29/2009  . OA (osteoarthritis) of knee 07/29/2009  . ELECTROCARDIOGRAM, ABNORMAL 07/29/2009   Royce Macadamia PT, DPT, CSCS    01/28/2017, 10:26 AM  [place] [facility] [address] [place], [address] Phone: [phone]   Fax:  [phone]  Name: Maria [name] MRN: [id number] Date of Birth: 1951-06-24

## 2017-01-28 NOTE — Patient Instructions (Addendum)
Leg Press - 55# x 12, 75# x12 for 3 sets   Leg extensiosn - 15# for 12, 20# x 10 for 2 sets   TRX sit to stands x 15   Sit to stands (unable to complete without use of UEs) x 8 with use of UEs on the way up, controlling eccentric portion for 2 sets (much improved control on descent with cuing)   Step ups (lateral) with minimal HHA x 10 for 2 sets   Gait training with water cup for bio-feedback for antalgic "limping" pattern correction.

## 2017-01-28 NOTE — Therapy (Signed)
[place] [facility] [address] [place], [address] Phone: [phone]   Fax:  [phone]  Physical Therapy Treatment  Patient Details  Name: Maria [name] MRN: [id number] Date of Birth: 07-15-1951 Referring Provider: [name] MD  Encounter Date: 01/24/2017      PT End of Session - 01/28/17 0831    Visit Number 13   Number of Visits 20   Date for PT Re-Evaluation 02/21/17   PT Start Time 5329   PT Stop Time 1800   PT Time Calculation (min) 45 min   Activity Tolerance Patient tolerated treatment well   Behavior During Therapy [facility] for tasks assessed/performed      Past Medical History:  Diagnosis Date  . Allergy   . Arthritis   . Asthma   . Cancer [facility])    chronic lymphacytic leukemia  . Diabetes mellitus   . Hyperlipidemia   . Hypertension   . Hypothyroidism   . Thyroid disease    hypothyroidism    Past Surgical History:  Procedure Laterality Date  . ABDOMINAL HYSTERECTOMY    . LYMPH NODE DISSECTION    . STRABISMUS SURGERY    . TONSILLECTOMY    . TOTAL KNEE ARTHROPLASTY Right 12/03/2016   Procedure: RIGHT TOTAL KNEE ARTHROPLASTY;  Surgeon: [name], MD;  [facility];  Service: Orthopedics;  Laterality: Right;    There were no vitals filed for this visit.      Subjective Assessment - 01/28/17 0832    Subjective Patient reports she was fatigued after prior PT visit, but no other adverse reactions.    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 She has a history of diabetes but has been well controlled. history of progressive knee pain x >10 years. underwent surgery for Right TKA 12/03/2016   Limitations Sitting;Standing;Walking;Lifting;House hold activities;Other (comment)  work related activties   Patient Stated Goals To return to walking around without as much pain. Be able to  socialize with her friends.    Currently in Pain? Other (Comment)  More stiffness than pain around the knee.      5cm difference in joint line swelling between  Total Gym level 26 3 x15-- squats completed, cuing to increase ROM both from flexion and into extension for full extension.   TKEs -- with green t-band x 15 for 3 sets, PT cuing for appropriate knee/hip extension mechanics.  Leg Extensions on OMEGA x 15# for 3 sets x 10 repetitions (slightly uncomfortable but tolerable around the knee joint   Step Ups to 1 step -- attempted but still too difficult to complete without trunkal compensations. Opted to perform onto just step with no risers x 12 for 3 sets.                             PT Education - 01/28/17 [phone]    Education provided Yes   Education Details Continuing to progress with strengthening, will add to HEP in next session.    Person(s) Educated Patient   Methods Explanation;Demonstration   Comprehension Returned demonstration;Verbalized understanding             PT Long Term Goals - 12/14/16 1030      PT LONG TERM GOAL #1   Title Patient will improve ROM right knee flexion 0-100 degrees by 01/04/2017 to  improve transition sit to stand and ambulate with improved gait pattern   Baseline right knee flexion 10-60   Status New     PT LONG TERM GOAL #2   Title Patient will improve ambulation with normal gait pattern and least restrictive AD by 01/11/17 in order to improve household and community ambulation   Baseline ambulating with forward wheeled walker with guarded posture and decreased hip/knee flexion   Status New     PT LONG TERM GOAL #3   Title Patient will demonstrate improved function with daily tasks as indicated by LEFS score of 40/80 by 01/11/2017    Baseline LEFS to be assessed    Status New     PT LONG TERM GOAL #4   Title Patient will be independent with home program for pain control and ROM, strengthening exercises by discharge  01/11/2017 to allow patient to continue improvement with self management     Baseline limited knowledge of pain control strategies, appropriate exercises and progression and requires assistance, moderate cuing    Status New               Plan - 01/28/17 0830    Clinical Impression Statement Patient continues to have strength deficit in RLE, causing compensatory gait mechanics. She is improving week by week with her strength, though her inability to complete even small step ups demonstrates significant lack of hip and knee extensor strength. She would benefit from additional skilled PT services to address ongoing weakness and gait abnormalities.    Rehab Potential Good   Clinical Impairments Affecting Rehab Potential (+)acute condition right TKA, motivated, pror level of funciton(-)Diabetes, CLL, chronic condition of OA prior to surgery   PT Frequency Other (comment)  2-3x/week   PT Duration 4 weeks   PT Treatment/Interventions Electrical Stimulation;Cryotherapy;Gait training;Stair training;Patient/family education;Neuromuscular re-education;Balance training;Therapeutic exercise;Manual techniques;Scar mobilization   PT Next Visit Plan Manual therapy soft tissue techniques, therapeutic exercise, pain control   PT Home Exercise Plan ROM exercises for right knee, quad sets, glute sets, pain and swelling control   Consulted and Agree with Plan of Care Patient      Patient will benefit from skilled therapeutic intervention in order to improve the following deficits and impairments:  Decreased strength, Increased edema, Pain, Impaired perceived functional ability, Decreased activity tolerance, Difficulty walking, Decreased range of motion  Visit Diagnosis: Stiffness of right knee, not elsewhere classified  Difficulty in walking, not elsewhere classified  Chronic pain of right knee     Problem List Patient Active Problem List   Diagnosis Date Noted  . CLL (chronic lymphocytic  leukemia) ([place]) 07/24/2012  . PURE HYPERCHOLESTEROLEMIA 09/07/2009  . HYPOTHYROIDISM 07/29/2009  . 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 Essential hypertension 07/29/2009  . Asthma 07/29/2009  . OA (osteoarthritis) of knee 07/29/2009  . ELECTROCARDIOGRAM, ABNORMAL 07/29/2009    Royce Macadamia PT, DPT, CSCS    01/28/2017, 8:33 AM  [place] [facility] [address] [place], [address] Phone: [phone]   Fax:  [phone]  Name: Maria [name] MRN: [id number] Date of Birth: 1951/03/14

## 2017-01-31 ENCOUNTER — Ambulatory Visit: Payer: BC Managed Care – PPO | Admitting: Physical Therapy

## 2017-01-31 DIAGNOSIS — G8929 Other chronic pain: Secondary | ICD-10-CM

## 2017-01-31 DIAGNOSIS — R262 Difficulty in walking, not elsewhere classified: Secondary | ICD-10-CM

## 2017-01-31 DIAGNOSIS — M25561 Pain in right knee: Secondary | ICD-10-CM

## 2017-01-31 DIAGNOSIS — M25661 Stiffness of right knee, not elsewhere classified: Secondary | ICD-10-CM | POA: Diagnosis not present

## 2017-01-31 NOTE — Patient Instructions (Addendum)
Passive knee flexion   Leg extensions - 25# x 8, x 10 x 10 repetitions   Step Ups on RLE to 1 riser with HHA (much improved quad output) x 8 for 3 sets   Leg Press 75# x 13, 95# x 6 for 2 sets  Total gym single leg squats -- level 17 x 6 for 3 sets on RLE.

## 2017-01-31 NOTE — Therapy (Signed)
[place] [facility] [address], [place], [address] Phone: [phone]   Fax:  [phone]  Physical Therapy Treatment  Patient Details  Name: Maria [name] MRN: [id number] Date of Birth: January 03, 1951 Referring Provider: [name] MD  Encounter Date: 01/31/2017      PT End of Session - 01/31/17 1522    Visit Number 15   Number of Visits 20   Date for PT Re-Evaluation 02/21/17   PT Start Time 0737   PT Stop Time 1062   PT Time Calculation (min) 41 min   Activity Tolerance Patient tolerated treatment well   Behavior During Therapy [facility] for tasks assessed/performed      Past Medical History:  Diagnosis Date  . Allergy   . Arthritis   . Asthma   . Cancer [facility])    chronic lymphacytic leukemia  . Diabetes mellitus   . Hyperlipidemia   . Hypertension   . Hypothyroidism   . Thyroid disease    hypothyroidism    Past Surgical History:  Procedure Laterality Date  . ABDOMINAL HYSTERECTOMY    . LYMPH NODE DISSECTION    . STRABISMUS SURGERY    . TONSILLECTOMY    . TOTAL KNEE ARTHROPLASTY Right 12/03/2016   Procedure: RIGHT TOTAL KNEE ARTHROPLASTY;  Surgeon: [name], MD;  [facility]: [facility];  Service: Orthopedics;  Laterality: Right;    There were no vitals filed for this visit.      Subjective Assessment - 01/31/17 1509    Subjective Patient reports she has had stiffness globally today, she has been off of her pain medications the past few days. She reports it has been easier for her to get her foot in and out of the pedicure    Patient is accompained by: Family member   Pertinent History Patient does have a history of CLL (in 2002) is on chronic chemotherapy. She has a history of diabetes but has been well controlled. history of progressive knee pain x >10 years. underwent surgery for Right TKA 12/03/2016   Limitations Sitting;Standing;Walking;Lifting;House hold activities;Other (comment)  work  related activties   Patient Stated Goals To return to walking around without as much pain. Be able to socialize with her friends.    Currently in Pain? Yes      Passive knee flexion with overpressure from therapist x 3 minutes through tolerated range (increase in discomfort only at end range. Swelling around medial portion of the knee joint noted to be reduced compared to previous visits).   Leg extensions - 25# x 8, x 10 x 10 repetitions   Step Ups on RLE to 1 riser with HHA (much improved quad output) x 8 for 3 sets   Leg Press 75# x 13, 95# x 6 for 2 sets  Total gym single leg squats -- level 17 x 6 for 3 sets on RLE.                            PT Education - 01/31/17 1530    Education provided Yes   Education Details Continued to emphasize need to increase quadricep strength/output.    Person(s) Educated Patient   Methods Explanation;Demonstration   Comprehension Verbalized understanding;Returned demonstration             PT Long Term Goals - 12/14/16 1030      PT LONG TERM GOAL #1   Title Patient will improve ROM right  knee flexion 0-100 degrees by 01/04/2017 to improve transition sit to stand and ambulate with improved gait pattern   Baseline right knee flexion 10-60   Status New     PT LONG TERM GOAL #2   Title Patient will improve ambulation with normal gait pattern and least restrictive AD by 01/11/17 in order to improve household and community ambulation   Baseline ambulating with forward wheeled walker with guarded posture and decreased hip/knee flexion   Status New     PT LONG TERM GOAL #3   Title Patient will demonstrate improved function with daily tasks as indicated by LEFS score of 40/80 by 01/11/2017    Baseline LEFS to be assessed    Status New     PT LONG TERM GOAL #4   Title Patient will be independent with home program for pain control and ROM, strengthening exercises by discharge 01/11/2017 to allow patient to continue improvement  with self management     Baseline limited knowledge of pain control strategies, appropriate exercises and progression and requires assistance, moderate cuing    Status New               Plan - 01/31/17 1523    Clinical Impression Statement Patient continues to have slight trunk flexion (laterally) in R side bend in R stance. She is demonstrating significant improvement in R quadricep strength as demonstrated by improved mechanics in step ups. She is progressing well, though given years of quadricep weakness, she will continue to benefit from skilled PT services.    Rehab Potential Good   Clinical Impairments Affecting Rehab Potential (+)acute condition right TKA, motivated, pror level of funciton(-)Diabetes, CLL, chronic condition of OA prior to surgery   PT Frequency Other (comment)  2-3x/week   PT Duration 4 weeks   PT Treatment/Interventions Electrical Stimulation;Cryotherapy;Gait training;Stair training;Patient/family education;Neuromuscular re-education;Balance training;Therapeutic exercise;Manual techniques;Scar mobilization   PT Next Visit Plan Manual therapy soft tissue techniques, therapeutic exercise, pain control   PT Home Exercise Plan ROM exercises for right knee, quad sets, glute sets, pain and swelling control   Consulted and Agree with Plan of Care Patient      Patient will benefit from skilled therapeutic intervention in order to improve the following deficits and impairments:  Decreased strength, Increased edema, Pain, Impaired perceived functional ability, Decreased activity tolerance, Difficulty walking, Decreased range of motion  Visit Diagnosis: Difficulty in walking, not elsewhere classified  Stiffness of right knee, not elsewhere classified  Chronic pain of right knee     Problem List Patient Active Problem List   Diagnosis Date Noted  . CLL (chronic lymphocytic leukemia) ([place]) 07/24/2012  . PURE HYPERCHOLESTEROLEMIA 09/07/2009  . HYPOTHYROIDISM  07/29/2009  . 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 Essential hypertension 07/29/2009  . Asthma 07/29/2009  . OA (osteoarthritis) of knee 07/29/2009  . ELECTROCARDIOGRAM, ABNORMAL 07/29/2009   Royce Macadamia PT, DPT, CSCS    01/31/2017, 6:30 PM  [place] [facility] [address] [place], [address] Phone: [phone]   Fax:  [phone]  Name: Maria [name] MRN: [id number] Date of Birth: 07/10/1951

## 2017-02-05 ENCOUNTER — Ambulatory Visit: Payer: BC Managed Care – PPO | Admitting: Physical Therapy

## 2017-02-05 DIAGNOSIS — R262 Difficulty in walking, not elsewhere classified: Secondary | ICD-10-CM

## 2017-02-05 DIAGNOSIS — M25561 Pain in right knee: Secondary | ICD-10-CM

## 2017-02-05 DIAGNOSIS — M25661 Stiffness of right knee, not elsewhere classified: Secondary | ICD-10-CM

## 2017-02-05 DIAGNOSIS — G8929 Other chronic pain: Secondary | ICD-10-CM

## 2017-02-05 NOTE — Therapy (Signed)
[facility] [address] [place], [address] Phone: [phone]   Fax:  [phone]  Physical Therapy Treatment  Patient Details  Name: Maria [name] MRN: [id number] Date of Birth: [birth year] Referring Provider: [name] MD  Encounter Date: 02/05/2017      PT End of Session - 02/05/17 1259    Visit Number 16   Number of Visits 20   Date for PT Re-Evaluation 02/21/17   PT Start Time 1120   PT Stop Time 1201   PT Time Calculation (min) 41 min   Activity Tolerance Patient tolerated treatment well   Behavior During Therapy [facility] for tasks assessed/performed      Past Medical History:  Diagnosis Date  . Allergy   . Arthritis   . Asthma   . Cancer [facility])    chronic lymphacytic leukemia  . Diabetes mellitus   . Hyperlipidemia   . Hypertension   . Hypothyroidism   . Thyroid disease    hypothyroidism    Past Surgical History:  Procedure Laterality Date  . ABDOMINAL HYSTERECTOMY    . LYMPH NODE DISSECTION    . STRABISMUS SURGERY    . TONSILLECTOMY    . TOTAL KNEE ARTHROPLASTY Right 12/03/2016   Procedure: RIGHT TOTAL KNEE ARTHROPLASTY;  Surgeon: [name], MD;  Location: [facility];  Service: Orthopedics;  Laterality: Right;    There were no vitals filed for this visit.      Subjective Assessment - 02/05/17 1130    Subjective Patient reports she has been ambulating around the home more and more without any AD. She reports she continues to notice less "limping".    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 She has a history of diabetes but has been well controlled. history of progressive knee pain x >10 years. underwent surgery for Right TKA 12/03/2016   Limitations Sitting;Standing;Walking;Lifting;House hold activities;Other (comment)  work related activties   Patient Stated Goals To return to  walking around without as much pain. Be able to socialize with her friends.    Currently in Pain? No/denies      Leg press 45# x 12, 95# x 12, 105# x 12, progressed to more velocity based training for power output 55# x 10 cuing to increase x 2 sets   Raised SPC -- noted decreased lateral trunk flexion and "limping" motion. Patient reported more comfortable as well.   Step Ups x 10 for 3 sets on RLE with 1 riser and bilateral HHA -- continues to improve relative quad output and less reliance on trunk motion to complete knee extension.   TRX squats x 10 for 3 sets (minimal valgus noted indicating improved quad)   Leg Extensions x 20# x 12 for 2 sets (much improved quad output)                            PT Education - 02/05/17 1258    Education provided Yes   Education Details Will have patient come in tomorrow to work on gait without device.    Person(s) Educated Patient   Methods Explanation;Demonstration   Comprehension Verbalized understanding;Returned demonstration             PT Long Term Goals - 12/14/16 1030      PT LONG TERM GOAL #1   Title Patient will  improve ROM right knee flexion 0-100 degrees by 01/04/2017 to improve transition sit to stand and ambulate with improved gait pattern   Baseline right knee flexion 10-60   Status New     PT LONG TERM GOAL #2   Title Patient will improve ambulation with normal gait pattern and least restrictive AD by 01/11/17 in order to improve household and community ambulation   Baseline ambulating with forward wheeled walker with guarded posture and decreased hip/knee flexion   Status New     PT LONG TERM GOAL #3   Title Patient will demonstrate improved function with daily tasks as indicated by LEFS score of 40/80 by 01/11/2017    Baseline LEFS to be assessed    Status New     PT LONG TERM GOAL #4   Title Patient will be independent with home program for pain control and ROM, strengthening exercises by  discharge 01/11/2017 to allow patient to continue improvement with self management     Baseline limited knowledge of pain control strategies, appropriate exercises and progression and requires assistance, moderate cuing    Status New               Plan - 02/05/17 1259    Clinical Impression Statement Patient is now able to ambulate with SPC with no limp or compensations with cuing. Her quadricep strength continues to progress nicely, and her gait mechanics reflect improved functional strength. She will be progressed to ambulating without SPC in follow up sessions while continuing to focus on quadricep strength.    Rehab Potential Good   Clinical Impairments Affecting Rehab Potential (+)acute condition right TKA, motivated, pror level of funciton(-)Diabetes, CLL, chronic condition of OA prior to surgery   PT Frequency Other (comment)  2-3x/week   PT Duration 4 weeks   PT Treatment/Interventions Electrical Stimulation;Cryotherapy;Gait training;Stair training;Patient/family education;Neuromuscular re-education;Balance training;Therapeutic exercise;Manual techniques;Scar mobilization   PT Next Visit Plan Manual therapy soft tissue techniques, therapeutic exercise, pain control   PT Home Exercise Plan ROM exercises for right knee, quad sets, glute sets, pain and swelling control   Consulted and Agree with Plan of Care Patient      Patient will benefit from skilled therapeutic intervention in order to improve the following deficits and impairments:  Decreased strength, Increased edema, Pain, Impaired perceived functional ability, Decreased activity tolerance, Difficulty walking, Decreased range of motion  Visit Diagnosis: Difficulty in walking, not elsewhere classified  Stiffness of right knee, not elsewhere classified  Chronic pain of right knee     Problem List Patient Active Problem List   Diagnosis Date Noted  . CLL (chronic lymphocytic leukemia) ([place]) 07/24/2012  . PURE  HYPERCHOLESTEROLEMIA 09/07/2009  . HYPOTHYROIDISM 07/29/2009  . 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 Essential hypertension 07/29/2009  . Asthma 07/29/2009  . OA (osteoarthritis) of knee 07/29/2009  . ELECTROCARDIOGRAM, ABNORMAL 07/29/2009   Royce Macadamia PT, DPT, CSCS    02/05/2017, 1:01 PM   [facility] [address], [place], [address] Phone: [phone]   Fax:  [phone]  Name: Maria [name] MRN: [id number] Date of Birth: 1951/03/24

## 2017-02-05 NOTE — Patient Instructions (Addendum)
Leg press 45# x 12, 95# x 12, 105# x 12, progressed to more velocity based training for power output 55# x 10 cuing to increase x 2 sets   Raised SPC -- noted decreased   Step Ups x 10 for 3 sets on RLE with 1 riser and bilateral HHA   TRX squats x 10 for 3 sets (minimal valgus noted indicating improved quad)

## 2017-02-06 ENCOUNTER — Ambulatory Visit: Payer: BC Managed Care – PPO | Admitting: Physical Therapy

## 2017-02-06 DIAGNOSIS — G8929 Other chronic pain: Secondary | ICD-10-CM

## 2017-02-06 DIAGNOSIS — M25661 Stiffness of right knee, not elsewhere classified: Secondary | ICD-10-CM | POA: Diagnosis not present

## 2017-02-06 DIAGNOSIS — R262 Difficulty in walking, not elsewhere classified: Secondary | ICD-10-CM

## 2017-02-06 DIAGNOSIS — M25561 Pain in right knee: Secondary | ICD-10-CM

## 2017-02-06 NOTE — Patient Instructions (Signed)
Ankle DF mobilizations   Walking with water cup  Knee extension mobs

## 2017-02-06 NOTE — Therapy (Signed)
[facility] [address], [place], [address] Phone: [phone]   Fax:  [phone]  Physical Therapy Treatment  Patient Details  Name: Maria [name] MRN: [id number] Date of Birth: 09/17/1950 Referring Provider: [name] MD  Encounter Date: 02/06/2017      PT End of Session - 02/06/17 1252    Visit Number 17   Number of Visits 20   Date for PT Re-Evaluation 02/21/17   PT Start Time 0930   PT Stop Time 1010   PT Time Calculation (min) 40 min   Activity Tolerance Patient tolerated treatment well   Behavior During Therapy [facility] for tasks assessed/performed      Past Medical History:  Diagnosis Date  . Allergy   . Arthritis   . Asthma   . Cancer [facility])    chronic lymphacytic leukemia  . Diabetes mellitus   . Hyperlipidemia   . Hypertension   . Hypothyroidism   . Thyroid disease    hypothyroidism    Past Surgical History:  Procedure Laterality Date  . ABDOMINAL HYSTERECTOMY    . LYMPH NODE DISSECTION    . STRABISMUS SURGERY    . TONSILLECTOMY    . TOTAL KNEE ARTHROPLASTY Right 12/03/2016   Procedure: RIGHT TOTAL KNEE ARTHROPLASTY;  Surgeon: [name], MD;  [facility];  Service: Orthopedics;  Laterality: Right;    There were no vitals filed for this visit.      Subjective Assessment - 02/06/17 0954    Subjective Patient reports her ankle has continued to bother her, she believes due to swelling (which is chronic but exacerbated). She has had some soreness in her R hip after PT session yesterday, but that this is a muscular soreness. Relieved with a tylenol this morning.    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 She has a history of diabetes but has been well controlled. history of progressive knee pain x >10 years. underwent surgery for Right TKA 12/03/2016   Limitations  Sitting;Standing;Walking;Lifting;House hold activities;Other (comment)  work related activties   Patient Stated Goals To return to walking around without as much pain. Be able to socialize with her friends.    Currently in Pain? Other (Comment)  Some soreness in her R knee and ankle, took a Tylenol before starting therapy         Ankle DF mobilizations grade III by therapist x 4 bouts x 30" to increase ankle DF ROM, well tolerated on RLE.   Walking with water cup -- as biofeedback for decreasing Trendelenburg gait reduction (though per therapist this appears to also be due to decreased DF ROM causing circumduction gait with RLE.Patient able to intermittently perform gait without as much oscillation at the shoulders, though continues to revert back to the Trendelenburg/oscillating pattern most likely due to decreased ankle DF ROM.   Knee extension mobs in supine x 5 bouts x 3 sets for 2-4" holds -- uncomfortable around her knee, though tolerable.   Calf stretch on steps x 2 minutes x 2 bouts, cuing to allow for increased DF to facilitate improved gait pattern.    Discussed compression/ACE wrap with occupational therapist around the foot/ankle as she has been limited with her ROM due most likely to swelling.  PT Education - 02/06/17 1252    Education provided Yes   Education Details Use an ACE wrap and massage fluid proximally first then work distally and elevate to reduce swelling over the weekend.    Person(s) Educated Patient   Methods Explanation;Demonstration   Comprehension Verbalized understanding;Returned demonstration             PT Long Term Goals - 12/14/16 1030      PT LONG TERM GOAL #1   Title Patient will improve ROM right knee flexion 0-100 degrees by 01/04/2017 to improve transition sit to stand and ambulate with improved gait pattern   Baseline right knee flexion 10-60   Status New     PT LONG TERM GOAL #2   Title  Patient will improve ambulation with normal gait pattern and least restrictive AD by 01/11/17 in order to improve household and community ambulation   Baseline ambulating with forward wheeled walker with guarded posture and decreased hip/knee flexion   Status New     PT LONG TERM GOAL #3   Title Patient will demonstrate improved function with daily tasks as indicated by LEFS score of 40/80 by 01/11/2017    Baseline LEFS to be assessed    Status New     PT LONG TERM GOAL #4   Title Patient will be independent with home program for pain control and ROM, strengthening exercises by discharge 01/11/2017 to allow patient to continue improvement with self management     Baseline limited knowledge of pain control strategies, appropriate exercises and progression and requires assistance, moderate cuing    Status New               Plan - 02/06/17 1252    Clinical Impression Statement Patient is demonstrating circumduction gait, likely due to decreased DF ROM at her R ankle, likely exacerbated by swelling in her RLE. She reports feeling less stiff around the ankle and knee after joint mobilizations provided to ankle into DF and knee into extension. She is clearly improving her RLE strength, will focus this weekend on swelling management as this is likely hampering her gait pattern.    Rehab Potential Good   Clinical Impairments Affecting Rehab Potential (+)acute condition right TKA, motivated, pror level of funciton(-)Diabetes, CLL, chronic condition of OA prior to surgery   PT Frequency Other (comment)  2-3x/week   PT Duration 4 weeks   PT Treatment/Interventions Electrical Stimulation;Cryotherapy;Gait training;Stair training;Patient/family education;Neuromuscular re-education;Balance training;Therapeutic exercise;Manual techniques;Scar mobilization   PT Next Visit Plan Manual therapy soft tissue techniques, therapeutic exercise, pain control   PT Home Exercise Plan ROM exercises for right knee, quad  sets, glute sets, pain and swelling control   Consulted and Agree with Plan of Care Patient      Patient will benefit from skilled therapeutic intervention in order to improve the following deficits and impairments:  Decreased strength, Increased edema, Pain, Impaired perceived functional ability, Decreased activity tolerance, Difficulty walking, Decreased range of motion  Visit Diagnosis: Difficulty in walking, not elsewhere classified  Stiffness of right knee, not elsewhere classified  Chronic pain of right knee     Problem List Patient Active Problem List   Diagnosis Date Noted  . CLL (chronic lymphocytic leukemia) ([place]) 07/24/2012  . PURE HYPERCHOLESTEROLEMIA 09/07/2009  . HYPOTHYROIDISM 07/29/2009  . 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 Essential hypertension 07/29/2009  . Asthma 07/29/2009  . OA (osteoarthritis) of knee 07/29/2009  . ELECTROCARDIOGRAM, ABNORMAL 07/29/2009   Royce Macadamia  PT, DPT, CSCS    02/06/2017, 12:55 PM  [facility] [address], [place], [address] Phone: [phone]   Fax:  [phone]  Name: Maria [name] MRN: [id number] Date of Birth: 02-27-1951

## 2017-02-07 ENCOUNTER — Ambulatory Visit (HOSPITAL_BASED_OUTPATIENT_CLINIC_OR_DEPARTMENT_OTHER): Payer: BC Managed Care – PPO | Admitting: Oncology

## 2017-02-07 ENCOUNTER — Other Ambulatory Visit (HOSPITAL_BASED_OUTPATIENT_CLINIC_OR_DEPARTMENT_OTHER): Payer: BC Managed Care – PPO

## 2017-02-07 ENCOUNTER — Other Ambulatory Visit (HOSPITAL_BASED_OUTPATIENT_CLINIC_OR_DEPARTMENT_OTHER): Payer: BC Managed Care – PPO | Admitting: *Deleted

## 2017-02-07 VITALS — BP 164/64 | HR 66 | Temp 97.4°F | Resp 18 | Ht 66.0 in | Wt 194.6 lb

## 2017-02-07 DIAGNOSIS — C9110 Chronic lymphocytic leukemia of B-cell type not having achieved remission: Secondary | ICD-10-CM | POA: Diagnosis not present

## 2017-02-07 LAB — COMPREHENSIVE METABOLIC PANEL
ALBUMIN: 4.1 g/dL (ref 3.5–5.0)
ALK PHOS: 161 U/L — AB (ref 40–150)
ALT: 34 U/L (ref 0–55)
AST: 35 U/L — AB (ref 5–34)
Anion Gap: 9 mEq/L (ref 3–11)
BUN: 14.7 mg/dL (ref 7.0–26.0)
CALCIUM: 10.1 mg/dL (ref 8.4–10.4)
CO2: 29 mEq/L (ref 22–29)
CREATININE: 1 mg/dL (ref 0.6–1.1)
Chloride: 103 mEq/L (ref 98–109)
EGFR: 62 mL/min/{1.73_m2} — ABNORMAL LOW (ref >90)
GLUCOSE: 98 mg/dL (ref 70–140)
Potassium: 4.4 mEq/L (ref 3.5–5.1)
SODIUM: 142 meq/L (ref 136–145)
Total Bilirubin: 0.72 mg/dL (ref 0.20–1.20)
Total Protein: 6.4 g/dL (ref 6.4–8.3)

## 2017-02-07 LAB — CBC WITH DIFFERENTIAL/PLATELET
BASO%: 0.9 % (ref 0.0–2.0)
Basophils Absolute: 0.1 10*3/uL (ref 0.0–0.1)
EOS%: 2.4 % (ref 0.0–7.0)
Eosinophils Absolute: 0.2 10*3/uL (ref 0.0–0.5)
HCT: 46.2 % (ref 34.8–46.6)
HEMOGLOBIN: 14.7 g/dL (ref 11.6–15.9)
LYMPH#: 2.5 10*3/uL (ref 0.9–3.3)
LYMPH%: 33.1 % (ref 14.0–49.7)
MCH: 26.8 pg (ref 25.1–34.0)
MCHC: 31.8 g/dL (ref 31.5–36.0)
MCV: 84.2 fL (ref 79.5–101.0)
MONO#: 0.5 10*3/uL (ref 0.1–0.9)
MONO%: 7 % (ref 0.0–14.0)
NEUT%: 56.6 % (ref 38.4–76.8)
NEUTROS ABS: 4.2 10*3/uL (ref 1.5–6.5)
NRBC: 0 % (ref 0–0)
Platelets: 175 10*3/uL (ref 145–400)
RBC: 5.49 10*6/uL — ABNORMAL HIGH (ref 3.70–5.45)
RDW: 14.3 % (ref 11.2–14.5)
WBC: 7.4 10*3/uL (ref 3.9–10.3)

## 2017-02-07 LAB — LACTATE DEHYDROGENASE: LDH: 131 U/L (ref 125–245)

## 2017-02-07 NOTE — Progress Notes (Signed)
ID: CHANE MAGNER   DOB: [birth year]  MR#: [id number]  VZC#:[id number]  Patient Care Team: Marton Redwood, MD as PCP - General (Internal Medicine) [name], MD as Consulting Physician (Orthopedic Surgery) Lexus Barletta, [name] Dad, MD as Consulting Physician (Oncology)   CHIEF COMPLAINT: Small lymphocytic lymphoma  CURRENT TREATMENT: Ibrutinib   HISTORY OF PRESENT ILLNESS: From the original intake note:  [name] developed what she thought was "the flu" in December of 2002. She noted a large lymph node developing in her right anterior cervical area. 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   Her subsequent history is as detailed below   INTERVAL HISTORY: [name] returns today for follow-up of her small cell lymphocytic lymphoma/chronic lymphoid leukemia. He continues on ibrutinib and appreciates the fact that now there is a single 420 mg tablet, which makes compliance much easier. She is tolerating the medication with no side effects that she is aware of aside from mild bruising. She is currently receiving it at approximately $10 per month.  REVIEW OF SYSTEMS: [name] is recovering from her right knee surgery. So far she is satisfied with results. She is still undergoing rehabilitation twice a week plus the exercises she does on her own. She has a little bit of right ankle swelling associated with this. She has stress urinary incontinence which is not a new problem. She has arthritis pains here and there which are not new or more intense than prior. Aside from these issues a detailed review of systems today was stable  PAST MEDICAL HISTORY: Past Medical History:  Diagnosis Date  . Allergy   . Arthritis   . Asthma   . Cancer [facility])    chronic lymphacytic leukemia  . Diabetes mellitus   . Hyperlipidemia   . Hypertension   . Hypothyroidism   . Thyroid disease    hypothyroidism    PAST SURGICAL HISTORY: Past  Surgical History:  Procedure Laterality Date  . ABDOMINAL HYSTERECTOMY    . LYMPH NODE DISSECTION    . STRABISMUS SURGERY    . TONSILLECTOMY    . TOTAL KNEE ARTHROPLASTY Right 12/03/2016   Procedure: RIGHT TOTAL KNEE ARTHROPLASTY;  Surgeon: [name], MD;  Location: [facility];  Service: Orthopedics;  Laterality: Right;    FAMILY HISTORY Family History  Problem Relation Age of Onset  . Liver cancer Mother   . Heart disease Father   . Breast cancer Sister   . Colon cancer Neg Hx   . Pancreatic cancer Neg Hx   . Rectal cancer Neg Hx   . Stomach cancer Neg Hx    The patient's father died at the age of 5 from a myocardial infarction. The patient's mother died at the age of 24 from primary liver carcinoma. The patient had no brothers. Her one sister, [name], has a history of breast cancer  GYNECOLOGIC HISTORY: Menarche at around age 68. The patient is GX P0. She underwent simple hysterectomy without salpingo-oophorectomy in [place]: RN at [employer], working in the [place], 8h/d x 5d/week. She lives by herself with her cats [place] and [place]. She attends a local [place]: Not in place  HEALTH MAINTENANCE: Social History  Substance Use Topics  . Smoking status: Never Smoker  . Smokeless tobacco: Never Used  . Alcohol use Yes     Comment: occasional alcohol intake; once or twice monthly     Colonoscopy: 06/01/2014/[facility]  PAP:  Bone density:  Lipid panel:  228/114/50/155/ratio 4.6  On 10/01/2016  Allergies  Allergen Reactions  . Cephalexin     REACTION: Rash  . Lactose Intolerance (Gi)     Diarrhea, gas bloating  . Iohexol Swelling and Rash    Current Outpatient Prescriptions  Medication Sig Dispense Refill  . hydrochlorothiazide (HYDRODIURIL) 12.5 MG tablet Take 12.5 mg by mouth every morning.    Marland Kitchen HYDROmorphone (DILAUDID) 2 MG tablet Take 1-2 tablets (2-4 mg total) by mouth every 4 (four) hours as needed for moderate pain or  severe pain. (Patient not taking: Reported on 12/14/2016) 84 tablet 0  . Ibrutinib (IMBRUVICA) 420 MG TABS Take 1 tablet by mouth daily. (Patient taking differently: Take 1 tablet by mouth at bedtime. ) 30 tablet 2  . LORazepam (ATIVAN) 1 MG tablet Take 1 mg by mouth daily as needed for anxiety.     . methocarbamol (ROBAXIN) 500 MG tablet Take 1 tablet (500 mg total) by mouth every 6 (six) hours as needed for muscle spasms. 80 tablet 0  . rivaroxaban (XARELTO) 10 MG TABS tablet Take 1 tablet (10 mg total) by mouth daily with breakfast. Take Xarelto for two and a half more weeks following discharge from the hospital, then discontinue Xarelto. Once the patient has completed the blood thinner regimen, then take a Baby 81 mg Aspirin daily for three more weeks. 19 tablet 0  . SYNTHROID 88 MCG tablet Take 88 mcg by mouth daily before breakfast.     . traMADol (ULTRAM) 50 MG tablet Take 1-2 tablets (50-100 mg total) by mouth every 6 (six) hours as needed for moderate pain. 56 tablet 0   No current facility-administered medications for this visit.     OBJECTIVE: Middle-aged white woman Who appears stated age  66:   02/07/17 1259  BP: (!) 164/64  Pulse: 66  Resp: 18  Temp: 97.4 F (36.3 C)     Body mass index is 31.41 kg/m.    ECOG FS: 1  Sclerae unicteric, EOMs intact Oropharynx clear and moist No cervical or supraclavicular adenopathy, no axillary or inguinal adenopathy Lungs no rales or rhonchi Heart regular rate and rhythm Abd soft, nontender, positive bowel sounds, no palpable splenomegaly MSK no focal spinal tenderness, grade 1 right ankle edema without erythema Neuro: nonfocal, well oriented, appropriate affect Breasts: Deferred   LAB RESULTS:  Lab Results  Component Value Date   WBC 12.1 (H) 12/05/2016   NEUTROABS 2.3 08/21/2016   HGB 10.9 (L) 12/05/2016   HCT 32.4 (L) 12/05/2016   MCV 85.0 12/05/2016   PLT 156 12/05/2016      Chemistry      Component Value Date/Time    NA 135 12/05/2016 0413   NA 142 08/21/2016 0958   K 3.8 12/05/2016 0413   K 4.4 08/21/2016 0958   CL 103 12/05/2016 0413   CO2 28 12/05/2016 0413   CO2 26 08/21/2016 0958   BUN 14 12/05/2016 0413   BUN 15.8 08/21/2016 0958   CREATININE 0.75 12/05/2016 0413   CREATININE 0.9 08/21/2016 0958   GLU 101 (H) 10/18/2009 1542      Component Value Date/Time   CALCIUM 8.7 (L) 12/05/2016 0413   CALCIUM 9.4 08/21/2016 0958   ALKPHOS 111 11/28/2016 1011   ALKPHOS 107 08/21/2016 0958   AST 43 (H) 11/28/2016 1011   AST 19 08/21/2016 0958   ALT 60 (H) 11/28/2016 1011   ALT 16 08/21/2016 0958   BILITOT 0.9 11/28/2016  1011   BILITOT 0.82 08/21/2016 0958       No results found for: LABCA2  No components found for: [id number]  No results for input(s): INR in the last 168 hours.  Urinalysis    Component Value Date/Time   COLORURINE LT. YELLOW 01/24/2010 1017   APPEARANCEUR CLEAR 01/24/2010 1017   LABSPEC <=1.005 01/24/2010 1017   PHURINE 5.5 01/24/2010 1017   GLUCOSEU NEGATIVE 01/24/2010 [place] 01/24/2010 1017   KETONESUR NEGATIVE 01/24/2010 1017   UROBILINOGEN 0.2 01/24/2010 1017   NITRITE NEGATIVE 01/24/2010 1017   LEUKOCYTESUR NEGATIVE 01/24/2010 1017    STUDIES: MRI of the brain at [facility] 10/07/2016 and MRA of the neck, both with and without contrast, were negative MRI of the brain at [facility] of the head and neck with and without contrast at [facility] 10-07-2016 found no acute  ASSESSMENT: 66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 There was no CD10 or cyclin D1  positivity identified  (4) started ibrutinib at 420 mg/ day 08/09/2014  PLAN:  Porcha has now been on ibrutinib the urinary half. She tolerates it well. She is obtaining it at a good price. The plan is to continue that indefinitely on until there is further toxicity or disease progression.  She is planning to retire late this year. They will be an associated change in insurance at that time. She will let me know if that becomes a problem.  In any case I plan to see her again in 6 months. We will do lab work and physical exam only at that time.  She knows to call for any other issues that may develop before her return visit here. Jazzelle Zhang C    02/07/2017

## 2017-02-08 ENCOUNTER — Encounter: Payer: Self-pay | Admitting: Oncology

## 2017-02-08 LAB — IGG, IGA, IGM
IGG (IMMUNOGLOBIN G), SERUM: 436 mg/dL — AB (ref 700–1600)
IgA, Qn, Serum: 187 mg/dL (ref 87–352)
IgM, Qn, Serum: 13 mg/dL — ABNORMAL LOW (ref 26–217)

## 2017-02-12 ENCOUNTER — Ambulatory Visit: Payer: BC Managed Care – PPO | Attending: Orthopedic Surgery | Admitting: Physical Therapy

## 2017-02-12 DIAGNOSIS — M25561 Pain in right knee: Secondary | ICD-10-CM | POA: Diagnosis present

## 2017-02-12 DIAGNOSIS — R262 Difficulty in walking, not elsewhere classified: Secondary | ICD-10-CM | POA: Diagnosis present

## 2017-02-12 DIAGNOSIS — G8929 Other chronic pain: Secondary | ICD-10-CM | POA: Insufficient documentation

## 2017-02-12 DIAGNOSIS — M25661 Stiffness of right knee, not elsewhere classified: Secondary | ICD-10-CM | POA: Diagnosis present

## 2017-02-12 NOTE — Patient Instructions (Addendum)
Sit to stands x 12, with 1# DBs for 2nd and 3rd sets with [place] Stim on VL and VM  Step Ups   Leg Press 55# x 12, 75# x12, 95# x15, 105# x10 repetitions

## 2017-02-12 NOTE — Therapy (Signed)
[facility] [address], [place], [address] Phone: [phone]   Fax:  [phone]  Physical Therapy Treatment  Patient Details  Name: Maria [name] MRN: [id number] Date of Birth: November 29, 1950 Referring Provider: [name] MD  Encounter Date: 02/12/2017      PT End of Session - 02/12/17 1025    Visit Number 18   Number of Visits 20   Date for PT Re-Evaluation 02/21/17   PT Start Time 0950   PT Stop Time 1035   PT Time Calculation (min) 45 min   Activity Tolerance Patient tolerated treatment well   Behavior During Therapy [facility] for tasks assessed/performed      Past Medical History:  Diagnosis Date  . Allergy   . Arthritis   . Asthma   . Cancer [facility])    chronic lymphacytic leukemia  . Diabetes mellitus   . Hyperlipidemia   . Hypertension   . Hypothyroidism   . Thyroid disease    hypothyroidism    Past Surgical History:  Procedure Laterality Date  . ABDOMINAL HYSTERECTOMY    . LYMPH NODE DISSECTION    . STRABISMUS SURGERY    . TONSILLECTOMY    . TOTAL KNEE ARTHROPLASTY Right 12/03/2016   Procedure: RIGHT TOTAL KNEE ARTHROPLASTY;  Surgeon: [name], MD;  Location: [facility];  Service: Orthopedics;  Laterality: Right;    There were no vitals filed for this visit.      Subjective Assessment - 02/12/17 1023    Subjective Patient reports she has been wearing her compression hoes over the weekend and has noticed decreased swelling in the ankle joint. She continues to have some mild pain with weightbearing and first thing in the morning, she ascribes to stiffness/arthritic type of pain.    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 She has a history of diabetes but has been well controlled. history of progressive knee pain x >10 years. underwent surgery for Right TKA 12/03/2016   Limitations  Sitting;Standing;Walking;Lifting;House hold activities;Other (comment)  work related activties   Patient Stated Goals To return to walking around without as much pain. Be able to socialize with her friends.    Currently in Pain? Other (Comment)  Stiffness/ache in the morning, she ascribes to more of an arthritic type of pain.      Sit to stands x 12, with 1# DBs for 2nd and 3rd sets with [place] Stim on VL and VM too mA of current. She was able to complete 12 repetitions in each set, with less reliance on LLE with cuing.   Step Ups to 1 riser with 37mA of [place] current applied to VL and VM x 15 repetitions for 3 sets with bilateral UE assistance, notable for improved knee excursion without as much trunk compensatory movement.   Leg Press 55# x 12, 75# x12, 95# x15, 105# x10 repetitions (challenging, but well tolerated)                             PT Education - 02/12/17 1025    Education provided Yes   Education Details Will continue to progress quad strengthening as all the literature indicates it is highly correlated with functional outcomes.    Person(s) Educated Patient   Methods Explanation;Demonstration   Comprehension Verbalized understanding;Returned demonstration  PT Long Term Goals - 12/14/16 1030      PT LONG TERM GOAL #1   Title Patient will improve ROM right knee flexion 0-100 degrees by 01/04/2017 to improve transition sit to stand and ambulate with improved gait pattern   Baseline right knee flexion 10-60   Status New     PT LONG TERM GOAL #2   Title Patient will improve ambulation with normal gait pattern and least restrictive AD by 01/11/17 in order to improve household and community ambulation   Baseline ambulating with forward wheeled walker with guarded posture and decreased hip/knee flexion   Status New     PT LONG TERM GOAL #3   Title Patient will demonstrate improved function with daily tasks as indicated by LEFS score  of 40/80 by 01/11/2017    Baseline LEFS to be assessed    Status New     PT LONG TERM GOAL #4   Title Patient will be independent with home program for pain control and ROM, strengthening exercises by discharge 01/11/2017 to allow patient to continue improvement with self management     Baseline limited knowledge of pain control strategies, appropriate exercises and progression and requires assistance, moderate cuing    Status New               Plan - 02/12/17 1025    Clinical Impression Statement Patient continues to progress nicely with quadricep strengthening in closed chain activities. She continues to have Trendelenburg/"limping" gait pattern likely due to quadricep and hip ER/abductor weakness as well as compensatory pattern developed before the operation. She will continue to be progressed as tolerated for optimal functional outcome.    Clinical Presentation Stable   Clinical Decision Making Moderate   Rehab Potential Good   Clinical Impairments Affecting Rehab Potential (+)acute condition right TKA, motivated, pror level of funciton(-)Diabetes, CLL, chronic condition of OA prior to surgery   PT Frequency Other (comment)  2-3x/week   PT Duration 4 weeks   PT Treatment/Interventions Electrical Stimulation;Cryotherapy;Gait training;Stair training;Patient/family education;Neuromuscular re-education;Balance training;Therapeutic exercise;Manual techniques;Scar mobilization   PT Next Visit Plan Manual therapy soft tissue techniques, therapeutic exercise, pain control   PT Home Exercise Plan ROM exercises for right knee, quad sets, glute sets, pain and swelling control   Consulted and Agree with Plan of Care Patient      Patient will benefit from skilled therapeutic intervention in order to improve the following deficits and impairments:  Decreased strength, Increased edema, Pain, Impaired perceived functional ability, Decreased activity tolerance, Difficulty walking, Decreased range of  motion  Visit Diagnosis: Difficulty in walking, not elsewhere classified  Stiffness of right knee, not elsewhere classified  Chronic pain of right knee     Problem List Patient Active Problem List   Diagnosis Date Noted  . CLL (chronic lymphocytic leukemia) ([place]) 07/24/2012  . PURE HYPERCHOLESTEROLEMIA 09/07/2009  . HYPOTHYROIDISM 07/29/2009  . 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 Essential hypertension 07/29/2009  . Asthma 07/29/2009  . OA (osteoarthritis) of knee 07/29/2009  . ELECTROCARDIOGRAM, ABNORMAL 07/29/2009   Royce Macadamia PT, DPT, CSCS    02/12/2017, 10:59 AM  [facility] [address] [place], [address] Phone: [phone]   Fax:  [phone]  Name: HARRIETTE TOVEY MRN: [id number] Date of Birth: 1951-03-22

## 2017-02-14 ENCOUNTER — Ambulatory Visit: Payer: BC Managed Care – PPO | Admitting: Physical Therapy

## 2017-02-14 DIAGNOSIS — R262 Difficulty in walking, not elsewhere classified: Secondary | ICD-10-CM

## 2017-02-14 DIAGNOSIS — M25661 Stiffness of right knee, not elsewhere classified: Secondary | ICD-10-CM

## 2017-02-14 DIAGNOSIS — M25561 Pain in right knee: Secondary | ICD-10-CM

## 2017-02-14 DIAGNOSIS — G8929 Other chronic pain: Secondary | ICD-10-CM

## 2017-02-14 NOTE — Patient Instructions (Addendum)
LEFS - 42/80  TRX sit to stands x 12, x 10 repetitions x 12 repetitions   Leg press 75# x 12 (for speed), 95# x 12, 105#   Step ups  Sit to stands with pillow for boost x 5 with no hands x 3 sets

## 2017-02-18 ENCOUNTER — Ambulatory Visit: Payer: BC Managed Care – PPO | Admitting: Physical Therapy

## 2017-02-18 ENCOUNTER — Encounter: Payer: BC Managed Care – PPO | Admitting: Physical Therapy

## 2017-02-18 DIAGNOSIS — G8929 Other chronic pain: Secondary | ICD-10-CM

## 2017-02-18 DIAGNOSIS — R262 Difficulty in walking, not elsewhere classified: Secondary | ICD-10-CM | POA: Diagnosis not present

## 2017-02-18 DIAGNOSIS — M25661 Stiffness of right knee, not elsewhere classified: Secondary | ICD-10-CM

## 2017-02-18 DIAGNOSIS — M25561 Pain in right knee: Secondary | ICD-10-CM

## 2017-02-18 NOTE — Patient Instructions (Addendum)
SLDL  Gait training  Lateral step ups   Standing hip abductions with red t-band on half ball   Side stepping at TM x 3 laps with red t-band around ankles.

## 2017-02-18 NOTE — Therapy (Signed)
[facility] [address], [place], [address] Phone: [phone]   Fax:  [phone]  Physical Therapy Treatment  Patient Details  Name: Maria [name] MRN: [id number] Date of Birth: 1950-11-03 Referring Provider: [name] MD  Encounter Date: 02/18/2017      PT End of Session - 02/18/17 1404    Visit Number 20   Number of Visits 30   Date for PT Re-Evaluation 03/21/17   PT Start Time 1107   PT Stop Time 1159   PT Time Calculation (min) 52 min   Activity Tolerance Patient tolerated treatment well   Behavior During Therapy [facility] for tasks assessed/performed      Past Medical History:  Diagnosis Date  . Allergy   . Arthritis   . Asthma   . Cancer [facility])    chronic lymphacytic leukemia  . Diabetes mellitus   . Hyperlipidemia   . Hypertension   . Hypothyroidism   . Thyroid disease    hypothyroidism    Past Surgical History:  Procedure Laterality Date  . ABDOMINAL HYSTERECTOMY    . LYMPH NODE DISSECTION    . STRABISMUS SURGERY    . TONSILLECTOMY    . TOTAL KNEE ARTHROPLASTY Right 12/03/2016   Procedure: RIGHT TOTAL KNEE ARTHROPLASTY;  Surgeon: [name], MD;  Location: [facility];  Service: Orthopedics;  Laterality: Right;    There were no vitals filed for this visit.      Subjective Assessment - 02/18/17 1108    Subjective Patient reports she continues to notice she is limping on her R side. She reports that she has more pain in her R ankle than her knee. She also reports the swelling is about the same around her ankle.    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 She has a history of diabetes but has been well controlled. history of progressive knee pain x >10 years. underwent surgery for Right TKA 12/03/2016   Limitations Sitting;Standing;Walking;Lifting;House hold activities;Other (comment)   work related activties   Patient Stated Goals To return to walking around without as much pain. Be able to socialize with her friends.    Currently in Pain? Other (Comment)      SLDL -- 3 sets x 10 repetitions bilaterally with bilateral HHA, patient cued for full excursion of hip flexion/extension which was challenging but appropriate on her RLE.   Gait training - used a mirror in front of patient to provide visual feedback with verbal cuing for longer strides to maximize DF on RLE (which has been noted to be limited) Patient continues to intermittently laterally flex at trunk and demonstrate trendelenburg gait secondary to quadricep and postero-lateral hip musculature strength deficits and prolonged time of pain/altered gait and poor motor patterns pre-op.   Lateral step ups to 1 riser and step x 10 per side with minimal trunk deviations noted.   Standing hip abductions with red t-band on half ball x 12 repetitions x 3 sets (quite challenging on RLE in stance phase)  Side stepping at TM x 3 laps with red t-band around ankles.                           PT Education - 02/18/17 1408    Education provided Yes   Education Details Her knee stability is much improved, now she will  need more hip abductor strengthening for gait improvement.    Person(s) Educated Patient   Methods Explanation;Demonstration   Comprehension Verbalized understanding;Returned demonstration             PT Long Term Goals - 02/18/17 0804      PT LONG TERM GOAL #1   Title Patient will improve ROM right knee flexion 0-100 degrees by 01/04/2017 to improve transition sit to stand and ambulate with improved gait pattern   Baseline right knee flexion 10-60   Status Achieved     PT LONG TERM GOAL #2   Title Patient will improve ambulation with normal gait pattern and least restrictive AD by 01/11/17 in order to improve household and community ambulation   Baseline ambulating with forward  wheeled walker with guarded posture and decreased hip/knee flexion   Status On-going     PT LONG TERM GOAL #3   Title Patient will demonstrate improved function with daily tasks as indicated by LEFS score of 40/80 by 01/11/2017    Baseline LEFS to be assessed    Status Achieved     PT LONG TERM GOAL #4   Title Patient will be independent with home program for pain control and ROM, strengthening exercises by discharge 01/11/2017 to allow patient to continue improvement with self management     Baseline limited knowledge of pain control strategies, appropriate exercises and progression and requires assistance, moderate cuing    Status Achieved               Plan - 02/18/17 1405    Clinical Impression Statement Patient is able to correct gait pattern intermittently, it appears this is contributed to by decreased stride length, likely a compensatory mechanism from years of knee pain/weakness. She is demonstrating good knee control, however trunk control is still limited likely secondary to decreased ankle DF ROM and decreased hip strength especially in lateral rotators and abductors. She was able to complete resistance training on this area today, but will require continued progression.    Clinical Presentation Stable   Clinical Decision Making Moderate   Rehab Potential Good   Clinical Impairments Affecting Rehab Potential (+)acute condition right TKA, motivated, pror level of funciton(-)Diabetes, CLL, chronic condition of OA prior to surgery   PT Frequency Other (comment)  2-3x/week   PT Duration 4 weeks   PT Treatment/Interventions Electrical Stimulation;Cryotherapy;Gait training;Stair training;Patient/family education;Neuromuscular re-education;Balance training;Therapeutic exercise;Manual techniques;Scar mobilization   PT Next Visit Plan Manual therapy soft tissue techniques, therapeutic exercise, pain control   PT Home Exercise Plan ROM exercises for right knee, quad sets, glute sets,  pain and swelling control   Consulted and Agree with Plan of Care Patient      Patient will benefit from skilled therapeutic intervention in order to improve the following deficits and impairments:  Decreased strength, Increased edema, Pain, Impaired perceived functional ability, Decreased activity tolerance, Difficulty walking, Decreased range of motion  Visit Diagnosis: Difficulty in walking, not elsewhere classified  Stiffness of right knee, not elsewhere classified  Chronic pain of right knee     Problem List Patient Active Problem List   Diagnosis Date Noted  . CLL (chronic lymphocytic leukemia) ([place]) 07/24/2012  . PURE HYPERCHOLESTEROLEMIA 09/07/2009  . HYPOTHYROIDISM 07/29/2009  . 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 Essential hypertension 07/29/2009  . Asthma 07/29/2009  . OA (osteoarthritis) of knee 07/29/2009  . ELECTROCARDIOGRAM, ABNORMAL 07/29/2009   Royce Macadamia PT, DPT, CSCS    02/18/2017, 2:10 PM  [facility] [address] [place], [address] Phone: [phone]   Fax:  [phone]  Name: Maria [name] MRN: [id number] Date of Birth: 1950/12/07

## 2017-02-18 NOTE — Therapy (Signed)
[facility] [address] [place], [address] Phone: [phone]   Fax:  [phone]  Physical Therapy Treatment  Patient Details  Name: Maria [name] MRN: [id number] Date of Birth: [birth year] Referring Provider: [name] MD  Encounter Date: 02/14/2017      PT End of Session - 02/18/17 0757    Visit Number 19   Number of Visits 30   Date for PT Re-Evaluation 03/21/17   PT Start Time 1430   PT Stop Time 1515   PT Time Calculation (min) 45 min   Activity Tolerance Patient tolerated treatment well   Behavior During Therapy [facility] for tasks assessed/performed      Past Medical History:  Diagnosis Date  . Allergy   . Arthritis   . Asthma   . Cancer [facility])    chronic lymphacytic leukemia  . Diabetes mellitus   . Hyperlipidemia   . Hypertension   . Hypothyroidism   . Thyroid disease    hypothyroidism    Past Surgical History:  Procedure Laterality Date  . ABDOMINAL HYSTERECTOMY    . LYMPH NODE DISSECTION    . STRABISMUS SURGERY    . TONSILLECTOMY    . TOTAL KNEE ARTHROPLASTY Right 12/03/2016   Procedure: RIGHT TOTAL KNEE ARTHROPLASTY;  Surgeon: [name], MD;  [facility];  Service: Orthopedics;  Laterality: Right;    There were no vitals filed for this visit.      Subjective Assessment - 02/18/17 0801    Subjective Patient reports she was able to ambulate down to her mailbox and back which is farther than she has been able to ambulate independently. She feels she is making progress, but not as quickly as she would like.    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 She has a history of diabetes but has been well controlled. history of progressive knee pain x >10 years. underwent surgery for Right TKA 12/03/2016   Limitations Sitting;Standing;Walking;Lifting;House hold activities;Other  (comment)  work related activties   Patient Stated Goals To return to walking around without as much pain. Be able to socialize with her friends.    Currently in Pain? Other (Comment)  Patient reports more stiffness than pain around her R knee.         LEFS - 42/80  TRX sit to stands x 12, x 10 repetitions x 12 repetitions (able to complete with increased R knee flexion--foot closer to the chair).  Leg press 75# x 12 (for speed), 95# x 12, 105# x 12 repetitions (challenging for her)   Step ups to 1 riser with bilateral HHA x 12 per side x 3 sets (challenging, less trunkal lean noted)   Sit to stands with pillow for boost x 5 with no hands x 3 sets -- improved speed and consistency from previous sessions.                           PT Education - 02/18/17 0759    Education provided Yes   Education Details Need to continue with strengthening and address altered gait pattern.    Person(s) Educated Patient   Methods Explanation;Demonstration   Comprehension Returned demonstration;Verbalized understanding             PT Long Term Goals - 02/18/17 0804      PT LONG  TERM GOAL #1   Title Patient will improve ROM right knee flexion 0-100 degrees by 01/04/2017 to improve transition sit to stand and ambulate with improved gait pattern   Baseline right knee flexion 10-60   Status Achieved     PT LONG TERM GOAL #2   Title Patient will improve ambulation with normal gait pattern and least restrictive AD by 01/11/17 in order to improve household and community ambulation   Baseline ambulating with forward wheeled walker with guarded posture and decreased hip/knee flexion   Status On-going     PT LONG TERM GOAL #3   Title Patient will demonstrate improved function with daily tasks as indicated by LEFS score of 40/80 by 01/11/2017    Baseline LEFS to be assessed    Status Achieved     PT LONG TERM GOAL #4   Title Patient will be independent with home program for pain  control and ROM, strengthening exercises by discharge 01/11/2017 to allow patient to continue improvement with self management     Baseline limited knowledge of pain control strategies, appropriate exercises and progression and requires assistance, moderate cuing    Status Achieved               Plan - 02/18/17 0757    Clinical Impression Statement Patient is able to ambulate short distances now without AD, though continues to have Trendelenburg gait pattern to substitute for quadricep and gluteal weakness, likely from prolonged pain/altered gait pattern pre-operatively. She has improved quadricep strength output as evidenced by improved ability to perform step ups and increasing repetitions on leg press at increased resistance. She would benefit from additional strengthening to improve gait pattern and maximize function.    Clinical Presentation Stable   Clinical Decision Making Moderate   Rehab Potential Good   Clinical Impairments Affecting Rehab Potential (+)acute condition right TKA, motivated, pror level of funciton(-)Diabetes, CLL, chronic condition of OA prior to surgery   PT Frequency Other (comment)  2-3x/week   PT Duration 4 weeks   PT Treatment/Interventions Electrical Stimulation;Cryotherapy;Gait training;Stair training;Patient/family education;Neuromuscular re-education;Balance training;Therapeutic exercise;Manual techniques;Scar mobilization   PT Next Visit Plan Manual therapy soft tissue techniques, therapeutic exercise, pain control   PT Home Exercise Plan ROM exercises for right knee, quad sets, glute sets, pain and swelling control   Consulted and Agree with Plan of Care Patient      Patient will benefit from skilled therapeutic intervention in order to improve the following deficits and impairments:  Decreased strength, Increased edema, Pain, Impaired perceived functional ability, Decreased activity tolerance, Difficulty walking, Decreased range of motion  Visit  Diagnosis: Difficulty in walking, not elsewhere classified  Stiffness of right knee, not elsewhere classified  Chronic pain of right knee     Problem List Patient Active Problem List   Diagnosis Date Noted  . CLL (chronic lymphocytic leukemia) ([place]) 07/24/2012  . PURE HYPERCHOLESTEROLEMIA 09/07/2009  . HYPOTHYROIDISM 07/29/2009  . 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 Essential hypertension 07/29/2009  . Asthma 07/29/2009  . OA (osteoarthritis) of knee 07/29/2009  . ELECTROCARDIOGRAM, ABNORMAL 07/29/2009   Royce Macadamia PT, DPT, CSCS    02/18/2017, 8:04 AM  [facility] [address], [place], [address] Phone: [phone]   Fax:  [phone]  Name: Maria [name] MRN: [id number] Date of Birth: June 03, 1951

## 2017-02-19 ENCOUNTER — Encounter: Payer: BC Managed Care – PPO | Admitting: Physical Therapy

## 2017-02-19 ENCOUNTER — Ambulatory Visit: Payer: BC Managed Care – PPO | Admitting: Physical Therapy

## 2017-02-21 ENCOUNTER — Ambulatory Visit: Payer: BC Managed Care – PPO | Admitting: Physical Therapy

## 2017-02-21 DIAGNOSIS — M25561 Pain in right knee: Secondary | ICD-10-CM

## 2017-02-21 DIAGNOSIS — G8929 Other chronic pain: Secondary | ICD-10-CM

## 2017-02-21 DIAGNOSIS — R262 Difficulty in walking, not elsewhere classified: Secondary | ICD-10-CM | POA: Diagnosis not present

## 2017-02-21 DIAGNOSIS — M25661 Stiffness of right knee, not elsewhere classified: Secondary | ICD-10-CM

## 2017-02-21 NOTE — Therapy (Signed)
[place] [facility] [address], [place], [address] Phone: [phone]   Fax:  [phone]  Physical Therapy Treatment  Patient Details  Name: Maria [name] MRN: [id number] Date of Birth: 29-Jul-1951 Referring Provider: [name] MD  Encounter Date: 02/21/2017      PT End of Session - 02/21/17 1048    Visit Number 21   Number of Visits 30   Date for PT Re-Evaluation 03/21/17   PT Start Time 5038   PT Stop Time 1115   PT Time Calculation (min) 43 min   Activity Tolerance Patient tolerated treatment well   Behavior During Therapy [facility] for tasks assessed/performed      Past Medical History:  Diagnosis Date  . Allergy   . Arthritis   . Asthma   . Cancer [facility])    chronic lymphacytic leukemia  . Diabetes mellitus   . Hyperlipidemia   . Hypertension   . Hypothyroidism   . Thyroid disease    hypothyroidism    Past Surgical History:  Procedure Laterality Date  . ABDOMINAL HYSTERECTOMY    . LYMPH NODE DISSECTION    . STRABISMUS SURGERY    . TONSILLECTOMY    . TOTAL KNEE ARTHROPLASTY Right 12/03/2016   Procedure: RIGHT TOTAL KNEE ARTHROPLASTY;  Surgeon: [name], MD;  Location: [facility];  Service: Orthopedics;  Laterality: Right;    There were no vitals filed for this visit.      Subjective Assessment - 02/21/17 1047    Subjective Patient reports she saw her orthopedic surgeon and they concluded that she was still not quite ready for return to her work. She felt quite sore (in her quads and posterior hip musculature) after previous PT session.    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 She has a history of diabetes but has been well controlled. history of progressive knee pain x >10 years. underwent surgery for Right TKA 12/03/2016   Limitations Sitting;Standing;Walking;Lifting;House hold  activities;Other (comment)  work related activties   Patient Stated Goals To return to walking around without as much pain. Be able to socialize with her friends.    Currently in Pain? Other (Comment)  More stiffness than pain in her R knee.        TG level 17 x 8 for 4 sets on RLE for single leg squats   SLDL on blue foam x 10 for 3 sets (appropriate hip flexion/extension excursion)   Step ups 6 for 3 sets to 2 risers with HHA bilaterally (challenging but appropriate)   TRX sit to stands with much improved speed and symmetrical knee flexion/extension rate and loading x 15 for 2 sets   Soft tissue mobilization on quadriceps musculature (mid belly) to reduce pain/soreness to complete session which was well tolerated.                           PT Education - 02/21/17 1048    Education provided Yes   Education Details Need to continue to focus on improving strength in quads/posterior hip musculature to normalize gait.    Person(s) Educated Patient   Methods Explanation;Demonstration   Comprehension Verbalized understanding;Returned demonstration             PT Long Term Goals - 02/18/17 0804      PT LONG TERM GOAL #1  Title Patient will improve ROM right knee flexion 0-100 degrees by 01/04/2017 to improve transition sit to stand and ambulate with improved gait pattern   Baseline right knee flexion 10-60   Status Achieved     PT LONG TERM GOAL #2   Title Patient will improve ambulation with normal gait pattern and least restrictive AD by 01/11/17 in order to improve household and community ambulation   Baseline ambulating with forward wheeled walker with guarded posture and decreased hip/knee flexion   Status On-going     PT LONG TERM GOAL #3   Title Patient will demonstrate improved function with daily tasks as indicated by LEFS score of 40/80 by 01/11/2017    Baseline LEFS to be assessed    Status Achieved     PT LONG TERM GOAL #4   Title Patient  will be independent with home program for pain control and ROM, strengthening exercises by discharge 01/11/2017 to allow patient to continue improvement with self management     Baseline limited knowledge of pain control strategies, appropriate exercises and progression and requires assistance, moderate cuing    Status Achieved               Plan - 02/21/17 1048    Clinical Impression Statement Patient continues to progress with quadricep strength (step ups to 2 risers) and higher levels on TG for single leg squats. She continues to have compensatory gait pattern though this appears to be to rely on passive stability rather than active stability at the knee.    Clinical Presentation Stable   Clinical Decision Making Moderate   Rehab Potential Good   Clinical Impairments Affecting Rehab Potential (+)acute condition right TKA, motivated, pror level of funciton(-)Diabetes, CLL, chronic condition of OA prior to surgery   PT Frequency Other (comment)  2-3x/week   PT Duration 4 weeks   PT Treatment/Interventions Electrical Stimulation;Cryotherapy;Gait training;Stair training;Patient/family education;Neuromuscular re-education;Balance training;Therapeutic exercise;Manual techniques;Scar mobilization   PT Next Visit Plan Manual therapy soft tissue techniques, therapeutic exercise, pain control   PT Home Exercise Plan ROM exercises for right knee, quad sets, glute sets, pain and swelling control   Consulted and Agree with Plan of Care Patient      Patient will benefit from skilled therapeutic intervention in order to improve the following deficits and impairments:  Decreased strength, Increased edema, Pain, Impaired perceived functional ability, Decreased activity tolerance, Difficulty walking, Decreased range of motion  Visit Diagnosis: Difficulty in walking, not elsewhere classified  Stiffness of right knee, not elsewhere classified  Chronic pain of right knee     Problem  List Patient Active Problem List   Diagnosis Date Noted  . CLL (chronic lymphocytic leukemia) ([place]) 07/24/2012  . PURE HYPERCHOLESTEROLEMIA 09/07/2009  . HYPOTHYROIDISM 07/29/2009  . 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 Essential hypertension 07/29/2009  . Asthma 07/29/2009  . OA (osteoarthritis) of knee 07/29/2009  . ELECTROCARDIOGRAM, ABNORMAL 07/29/2009   Royce Macadamia PT, DPT, CSCS    02/21/2017, 1:42 PM  [place] [facility] [address] [place], [address] Phone: [phone]   Fax:  [phone]  Name: Maria [name] MRN: [id number] Date of Birth: 1951/04/23

## 2017-02-21 NOTE — Patient Instructions (Signed)
TG level 17 x 8 for 4 sets on RLE for single leg squats   SLDL on blue foam x 10 for 3 sets   Step ups 6 for 3 sets to 2 risers with HHA

## 2017-02-25 ENCOUNTER — Ambulatory Visit: Payer: BC Managed Care – PPO

## 2017-02-25 DIAGNOSIS — M25661 Stiffness of right knee, not elsewhere classified: Secondary | ICD-10-CM

## 2017-02-25 DIAGNOSIS — R262 Difficulty in walking, not elsewhere classified: Secondary | ICD-10-CM | POA: Diagnosis not present

## 2017-02-25 NOTE — Therapy (Signed)
[place] [facility] [address], [place], [address] Phone: [phone]   Fax:  [phone]  Physical Therapy Treatment  Patient Details  Name: Maria [name] MRN: [id number] Date of Birth: Mar 29, 1951 Referring Provider: [name] MD  Encounter Date: 02/25/2017      PT End of Session - 02/25/17 1113    Visit Number 22   Number of Visits 30   Date for PT Re-Evaluation 03/21/17   PT Start Time 1108   PT Stop Time 1150   PT Time Calculation (min) 42 min   Activity Tolerance Patient tolerated treatment well   Behavior During Therapy [facility] for tasks assessed/performed      Past Medical History:  Diagnosis Date  . Allergy   . Arthritis   . Asthma   . Cancer [facility])    chronic lymphacytic leukemia  . Diabetes mellitus   . Hyperlipidemia   . Hypertension   . Hypothyroidism   . Thyroid disease    hypothyroidism    Past Surgical History:  Procedure Laterality Date  . ABDOMINAL HYSTERECTOMY    . LYMPH NODE DISSECTION    . STRABISMUS SURGERY    . TONSILLECTOMY    . TOTAL KNEE ARTHROPLASTY Right 12/03/2016   Procedure: RIGHT TOTAL KNEE ARTHROPLASTY;  Surgeon: [name], MD;  Location: [facility];  Service: Orthopedics;  Laterality: Right;    There were no vitals filed for this visit.      Subjective Assessment - 02/25/17 1110    Subjective Pt states she is doing well on this date. She had some soreness initially this morning which was well managed with Tylenol. She is performing HEP and it is going well. She has started to wear an ACE ankle brace to help with the swelling and it has seemed to help with both the swelling as well as knee stability. No specific questions or concerns at this time.    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 She has a history of diabetes but has been well controlled. history of  progressive knee pain x >10 years. underwent surgery for Right TKA 12/03/2016   Limitations Sitting;Standing;Walking;Lifting;House hold activities;Other (comment)  work related activties   Patient Stated Goals To return to walking around without as much pain. Be able to socialize with her friends.    Currently in Pain? No/denies         TREATMENT  Ther-ex NuStep L2 x 5 minutes during history; Total Gym R single leg squats level 17, 3 x 10, red band pulling medially to encouraged pt to actively abduct/ER at the hip;  Forward lunges onto BOSU leading with RLE 3 x 15, yellow band pulling medially to encouraged pt to actively abduct/ER at the hip;  R single leg dead lift x 10 for 3 sets, UE support with RUE, instability noted, cues for proper form/technique; Forward step ups 6" step leading with RLE, UE support bilaterally 2 x 10, appropriately challenging, cues to control eccentric lowering;  Matrix rotary hip 25# 3 x 15 bilateral;  TRX sit to stands, cues for even weight distribution between L and R side x 15 for 2 sets;                         PT Education - 02/25/17 1112    Education provided Yes   Education Details  Reinforced HEP   Person(s) Educated Patient   Methods Explanation   Comprehension Verbalized understanding             PT Long Term Goals - 02/18/17 0804      PT LONG TERM GOAL #1   Title Patient will improve ROM right knee flexion 0-100 degrees by 01/04/2017 to improve transition sit to stand and ambulate with improved gait pattern   Baseline right knee flexion 10-60   Status Achieved     PT LONG TERM GOAL #2   Title Patient will improve ambulation with normal gait pattern and least restrictive AD by 01/11/17 in order to improve household and community ambulation   Baseline ambulating with forward wheeled walker with guarded posture and decreased hip/knee flexion   Status On-going     PT LONG TERM GOAL #3   Title Patient will demonstrate  improved function with daily tasks as indicated by LEFS score of 40/80 by 01/11/2017    Baseline LEFS to be assessed    Status Achieved     PT LONG TERM GOAL #4   Title Patient will be independent with home program for pain control and ROM, strengthening exercises by discharge 01/11/2017 to allow patient to continue improvement with self management     Baseline limited knowledge of pain control strategies, appropriate exercises and progression and requires assistance, moderate cuing    Status Achieved               Plan - 02/25/17 1113    Clinical Impression Statement Pt demonstrates excellent progres with therapist on this date. She has difficulty with balance in R single leg stance during step-downs as well as single leg hip hinging. Pt provided encouragement to continue HEP and follow-up as scheduled.    Clinical Presentation Stable   Clinical Decision Making Moderate   Rehab Potential Good   Clinical Impairments Affecting Rehab Potential (+)acute condition right TKA, motivated, pror level of funciton(-)Diabetes, CLL, chronic condition of OA prior to surgery   PT Frequency Other (comment)  2-3x/week   PT Duration 4 weeks   PT Treatment/Interventions Electrical Stimulation;Cryotherapy;Gait training;Stair training;Patient/family education;Neuromuscular re-education;Balance training;Therapeutic exercise;Manual techniques;Scar mobilization   PT Next Visit Plan Manual therapy soft tissue techniques, therapeutic exercise, pain control   PT Home Exercise Plan ROM exercises for right knee, quad sets, glute sets, pain and swelling control   Consulted and Agree with Plan of Care Patient      Patient will benefit from skilled therapeutic intervention in order to improve the following deficits and impairments:  Decreased strength, Increased edema, Pain, Impaired perceived functional ability, Decreased activity tolerance, Difficulty walking, Decreased range of motion  Visit  Diagnosis: Stiffness of right knee, not elsewhere classified  Difficulty in walking, not elsewhere classified     Problem List Patient Active Problem List   Diagnosis Date Noted  . CLL (chronic lymphocytic leukemia) ([place]) 07/24/2012  . PURE HYPERCHOLESTEROLEMIA 09/07/2009  . HYPOTHYROIDISM 07/29/2009  . 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 Essential hypertension 07/29/2009  . Asthma 07/29/2009  . OA (osteoarthritis) of knee 07/29/2009  . ELECTROCARDIOGRAM, ABNORMAL 07/29/2009   Phillips Grout PT, DPT   Kip Cropp 02/25/2017, 1:07 PM  [place] [facility] [address], [place], [address] Phone: [phone]   Fax:  [phone]  Name: Maria [name] MRN: [id number] Date of Birth: 1950-09-30

## 2017-02-26 ENCOUNTER — Encounter: Payer: BC Managed Care – PPO | Admitting: Physical Therapy

## 2017-02-27 ENCOUNTER — Telehealth: Payer: Self-pay | Admitting: Pharmacy Technician

## 2017-02-27 ENCOUNTER — Ambulatory Visit: Payer: BC Managed Care – PPO

## 2017-02-27 DIAGNOSIS — R262 Difficulty in walking, not elsewhere classified: Secondary | ICD-10-CM | POA: Diagnosis not present

## 2017-02-27 DIAGNOSIS — M25661 Stiffness of right knee, not elsewhere classified: Secondary | ICD-10-CM

## 2017-02-27 NOTE — Therapy (Signed)
[place] [facility] [address] [place], [address] Phone: [phone]   Fax:  [phone]  Physical Therapy Treatment  Patient Details  Name: Maria [name] MRN: [id number] Date of Birth: 1951-06-13 Referring Provider: [name] MD  Encounter Date: 02/27/2017      PT End of Session - 02/27/17 1109    Visit Number 23   Number of Visits 30   Date for PT Re-Evaluation 03/21/17   PT Start Time 1110   PT Stop Time 1154   PT Time Calculation (min) 44 min   Activity Tolerance Patient tolerated treatment well   Behavior During Therapy [facility] for tasks assessed/performed      Past Medical History:  Diagnosis Date  . Allergy   . Arthritis   . Asthma   . Cancer [facility])    chronic lymphacytic leukemia  . Diabetes mellitus   . Hyperlipidemia   . Hypertension   . Hypothyroidism   . Thyroid disease    hypothyroidism    Past Surgical History:  Procedure Laterality Date  . ABDOMINAL HYSTERECTOMY    . LYMPH NODE DISSECTION    . STRABISMUS SURGERY    . TONSILLECTOMY    . TOTAL KNEE ARTHROPLASTY Right 12/03/2016   Procedure: RIGHT TOTAL KNEE ARTHROPLASTY;  Surgeon: [name], MD;  Location: [facility];  Service: Orthopedics;  Laterality: Right;    There were no vitals filed for this visit.      Subjective Assessment - 02/27/17 1108    Subjective Pt reports that she is doing well today. She noticed decreased R ankle swelling after last therapy session and believes that her ankle brace may have helped with that issue. No specific questions or concerns at this time. Denies ankle pain upon arrival   Patient is accompained by: Family member   Pertinent History Patient does have a history of CLL (in 2002) is on chronic chemotherapy. She has a history of diabetes but has been well controlled. history of progressive knee pain x >10 years. underwent surgery for Right TKA 12/03/2016   Limitations  Sitting;Standing;Walking;Lifting;House hold activities;Other (comment)  work related activties   Patient Stated Goals To return to walking around without as much pain. Be able to socialize with her friends.    Currently in Pain? No/denies          TREATMENT  Ther-ex NuStep L3/4 x 5 minutes during history, LE only with continual monitoring for appropriate resistance; Total Gym R single leg squats level 19, 3 x 10, yellow band pulling medially to encouraged pt to actively abduct/ER at the hip;  Wall squats with pball behind back 30s on/off x 3; RLE step stretch 30s x 3 Heel raises bilateral 3 x 10; Matrix rotary hip 25# 3 x 10 bilateral;  TRX sit to stands, cues for even weight distribution between L and R side 3 x 10;  Cues provided throughout session for proper weight shift and form during exercises;                      PT Education - 02/27/17 1108    Education provided Yes   Education Details Continue HEP, plan of care   Person(s) Educated Patient   Methods Explanation   Comprehension Verbalized understanding             PT Long Term Goals - 02/18/17 0804      PT LONG TERM GOAL #1   Title Patient will  improve ROM right knee flexion 0-100 degrees by 01/04/2017 to improve transition sit to stand and ambulate with improved gait pattern   Baseline right knee flexion 10-60   Status Achieved     PT LONG TERM GOAL #2   Title Patient will improve ambulation with normal gait pattern and least restrictive AD by 01/11/17 in order to improve household and community ambulation   Baseline ambulating with forward wheeled walker with guarded posture and decreased hip/knee flexion   Status On-going     PT LONG TERM GOAL #3   Title Patient will demonstrate improved function with daily tasks as indicated by LEFS score of 40/80 by 01/11/2017    Baseline LEFS to be assessed    Status Achieved     PT LONG TERM GOAL #4   Title Patient will be independent with home  program for pain control and ROM, strengthening exercises by discharge 01/11/2017 to allow patient to continue improvement with self management     Baseline limited knowledge of pain control strategies, appropriate exercises and progression and requires assistance, moderate cuing    Status Achieved               Plan - 02/27/17 1109    Clinical Impression Statement Pt continues to demonstrates good progress with therapy on this date. Good weight acceptance to RLE during TRX squats. Improving strength with single leg squats on Total Gym. Pt encouraged to continue HEP and follow-up as scheduled. Check range and progress with ther-ex/manual therapy at next session.    Clinical Presentation Stable   Clinical Decision Making Moderate   Rehab Potential Good   Clinical Impairments Affecting Rehab Potential (+)acute condition right TKA, motivated, pror level of funciton(-)Diabetes, CLL, chronic condition of OA prior to surgery   PT Frequency Other (comment)  2-3x/week   PT Duration 4 weeks   PT Treatment/Interventions Electrical Stimulation;Cryotherapy;Gait training;Stair training;Patient/family education;Neuromuscular re-education;Balance training;Therapeutic exercise;Manual techniques;Scar mobilization   PT Next Visit Plan ROM measures, Manual therapy soft tissue techniques, therapeutic exercise, pain control   PT Home Exercise Plan ROM exercises for right knee, quad sets, glute sets, pain and swelling control   Consulted and Agree with Plan of Care Patient      Patient will benefit from skilled therapeutic intervention in order to improve the following deficits and impairments:  Decreased strength, Increased edema, Pain, Impaired perceived functional ability, Decreased activity tolerance, Difficulty walking, Decreased range of motion  Visit Diagnosis: Stiffness of right knee, not elsewhere classified  Difficulty in walking, not elsewhere classified     Problem List Patient Active  Problem List   Diagnosis Date Noted  . CLL (chronic lymphocytic leukemia) ([name]) 07/24/2012  . PURE HYPERCHOLESTEROLEMIA 09/07/2009  . HYPOTHYROIDISM 07/29/2009  . 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 Essential hypertension 07/29/2009  . Asthma 07/29/2009  . OA (osteoarthritis) of knee 07/29/2009  . ELECTROCARDIOGRAM, ABNORMAL 07/29/2009   Phillips Grout PT, DPT   Fawzi Melman 02/27/2017, 12:45 PM  [place] [facility] [address], [place], [address] Phone: [phone]   Fax:  [phone]  Name: Maria [name] MRN: [id number] Date of Birth: 1951/03/05

## 2017-02-27 NOTE — Telephone Encounter (Signed)
Oral Chemotherapy Patient Advocate Encounter  Received notification from [employer] that prior authorization of [employer]mb[employer] is required.  Information faxed to [phone] Status is pending  Oral Oncology Clinic will continue to follow.  Gilmore Laroche, CPhT, [place] [facility] Patient Advocate [phone] 02/27/2017 9:57 AM

## 2017-02-28 ENCOUNTER — Encounter: Payer: BC Managed Care – PPO | Admitting: Physical Therapy

## 2017-03-04 NOTE — Telephone Encounter (Signed)
Oral Oncology Patient Advocate Encounter  Prior Authorization for [employer] has been approved.    Effective dates: 02/27/2017 - 02/27/2018  Oral Oncology Clinic will continue to follow.   Fabio Asa. Melynda Keller, [place] [facility] Patient Advocate [phone] 03/04/2017 3:55 PM

## 2017-03-05 ENCOUNTER — Ambulatory Visit: Payer: BC Managed Care – PPO | Admitting: Physical Therapy

## 2017-03-05 DIAGNOSIS — R262 Difficulty in walking, not elsewhere classified: Secondary | ICD-10-CM | POA: Diagnosis not present

## 2017-03-05 DIAGNOSIS — G8929 Other chronic pain: Secondary | ICD-10-CM

## 2017-03-05 DIAGNOSIS — M25561 Pain in right knee: Secondary | ICD-10-CM

## 2017-03-05 DIAGNOSIS — M25661 Stiffness of right knee, not elsewhere classified: Secondary | ICD-10-CM

## 2017-03-05 NOTE — Patient Instructions (Signed)
Ankle DF mobilizations   Step ups with 2 risers 3 x10   SLS on blue foam pad x 15" x 3  Standing hip abduction on RLE (attempted   Standing calf board

## 2017-03-05 NOTE — Therapy (Signed)
[facility] [address] [place], [address] Phone: [phone]   Fax:  [phone]  Physical Therapy Treatment  Patient Details  Name: Maria [name] MRN: [id number] Date of Birth: [birth year] Referring Provider: [name] MD  Encounter Date: 03/05/2017      PT End of Session - 03/05/17 1117    Visit Number 24   Number of Visits 30   Date for PT Re-Evaluation 03/21/17   PT Start Time 4401   PT Stop Time 1115   PT Time Calculation (min) 43 min   Activity Tolerance Patient tolerated treatment well   Behavior During Therapy [facility] for tasks assessed/performed      Past Medical History:  Diagnosis Date  . Allergy   . Arthritis   . Asthma   . Cancer [facility]    chronic lymphacytic leukemia  . Diabetes mellitus   . Hyperlipidemia   . Hypertension   . Hypothyroidism   . Thyroid disease    hypothyroidism    Past Surgical History:  Procedure Laterality Date  . ABDOMINAL HYSTERECTOMY    . LYMPH NODE DISSECTION    . STRABISMUS SURGERY    . TONSILLECTOMY    . TOTAL KNEE ARTHROPLASTY Right 12/03/2016   Procedure: RIGHT TOTAL KNEE ARTHROPLASTY;  Surgeon: [name], MD;  Location: [facility];  Service: Orthopedics;  Laterality: Right;    There were no vitals filed for this visit.      Subjective Assessment - 03/05/17 1101    Subjective Patient reports the new compression brace has really helped control swelling when she has worn it (didn't over the weekend and found her R ankle began to swell tremendously, though has since resolved). Reports her issues now appear to be related more to arthritic ankle than knee joint.    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 She has a history of diabetes but has been well controlled. history of progressive knee pain x >10 years. underwent surgery for Right TKA 12/03/2016    Limitations Sitting;Standing;Walking;Lifting;House hold activities;Other (comment)  work related activties   Patient Stated Goals To return to walking around without as much pain. Be able to socialize with her friends.    Currently in Pain? Other (Comment)      Ankle DF mobilizations grade IV in supine to RLE with one hand performing DF mobilizations to talus and contralateral hand to stabilize knee into extension. X 6 bouts x 15-30" of oscillations at end range   Step ups with 2 risers 3 x10 with bilateral HHA-- much smoother motion than her initial sessions indicative of progressive quadricep strength improvement on RLE. No difficulty noted with LLE   SLS on blue foam pad x 15" x 3 to focus on trunk "alignment" relative to hips in mid-stance as she continues to have lateral trunk flexion in gait to compensate for glute med weakness causing a relative "limp" in gait.  Standing hip abduction on RLE (attempted with green t-band which was too difficult) -- instead performed 3 sets x 12 repetitions bilaterally with red t-band (challenging but appropriate)   Standing calf board stretching x 30" for 3 bouts (no pain reported, appropriate stretch noted)   Patient noted to continue to have altered gait pattern secondary to glute/quad weakness, ankle DF ROM restrictions and likely motor patterning as she begins to alter trunk orientation even prior to ground  contact on RLE.                             PT Education - 03/05/17 1117    Education provided Yes   Education Details WIll continue to focus tx on improving glute med strength and ankle DF ROM.    Person(s) Educated Patient   Methods Explanation   Comprehension Verbalized understanding             PT Long Term Goals - 02/18/17 0804      PT LONG TERM GOAL #1   Title Patient will improve ROM right knee flexion 0-100 degrees by 01/04/2017 to improve transition sit to stand and ambulate with improved gait pattern    Baseline right knee flexion 10-60   Status Achieved     PT LONG TERM GOAL #2   Title Patient will improve ambulation with normal gait pattern and least restrictive AD by 01/11/17 in order to improve household and community ambulation   Baseline ambulating with forward wheeled walker with guarded posture and decreased hip/knee flexion   Status On-going     PT LONG TERM GOAL #3   Title Patient will demonstrate improved function with daily tasks as indicated by LEFS score of 40/80 by 01/11/2017    Baseline LEFS to be assessed    Status Achieved     PT LONG TERM GOAL #4   Title Patient will be independent with home program for pain control and ROM, strengthening exercises by discharge 01/11/2017 to allow patient to continue improvement with self management     Baseline limited knowledge of pain control strategies, appropriate exercises and progression and requires assistance, moderate cuing    Status Achieved               Plan - 03/05/17 1117    Clinical Impression Statement Patient is demonstrating improving gait mechanics, though still has a mild "limp" which appears to be contributed by anticipation of weakness, glute med weakness, and decreased DF at her R ankle. She also has anatomical valgus at R knee which may be contributing to her altered gait pattern. She is steadily improving, though unclear how much her gait mechanics will improve with time.    Clinical Presentation Stable   Clinical Decision Making Moderate   Rehab Potential Good   Clinical Impairments Affecting Rehab Potential (+)acute condition right TKA, motivated, pror level of funciton(-)Diabetes, CLL, chronic condition of OA prior to surgery   PT Frequency Other (comment)  2-3x/week   PT Duration 4 weeks   PT Treatment/Interventions Electrical Stimulation;Cryotherapy;Gait training;Stair training;Patient/family education;Neuromuscular re-education;Balance training;Therapeutic exercise;Manual techniques;Scar mobilization    PT Next Visit Plan ROM measures, Manual therapy soft tissue techniques, therapeutic exercise, pain control   PT Home Exercise Plan ROM exercises for right knee, quad sets, glute sets, pain and swelling control   Consulted and Agree with Plan of Care Patient      Patient will benefit from skilled therapeutic intervention in order to improve the following deficits and impairments:  Decreased strength, Increased edema, Pain, Impaired perceived functional ability, Decreased activity tolerance, Difficulty walking, Decreased range of motion  Visit Diagnosis: Stiffness of right knee, not elsewhere classified  Difficulty in walking, not elsewhere classified  Chronic pain of right knee     Problem List Patient Active Problem List   Diagnosis Date Noted  . CLL (chronic lymphocytic leukemia) ([place]) 07/24/2012  . PURE HYPERCHOLESTEROLEMIA 09/07/2009  . HYPOTHYROIDISM 07/29/2009  .  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 Essential hypertension 07/29/2009  . Asthma 07/29/2009  . OA (osteoarthritis) of knee 07/29/2009  . ELECTROCARDIOGRAM, ABNORMAL 07/29/2009   Royce Macadamia PT, DPT, CSCS    03/05/2017, 12:54 PM  [facility] [address] [place], [address] Phone: [phone]   Fax:  [phone]  Name: Maria [name] MRN: [id number] Date of Birth: [birth year]

## 2017-03-07 ENCOUNTER — Encounter: Payer: BC Managed Care – PPO | Admitting: Physical Therapy

## 2017-03-07 ENCOUNTER — Ambulatory Visit: Payer: BC Managed Care – PPO | Admitting: Physical Therapy

## 2017-03-07 DIAGNOSIS — G8929 Other chronic pain: Secondary | ICD-10-CM

## 2017-03-07 DIAGNOSIS — M25661 Stiffness of right knee, not elsewhere classified: Secondary | ICD-10-CM

## 2017-03-07 DIAGNOSIS — R262 Difficulty in walking, not elsewhere classified: Secondary | ICD-10-CM

## 2017-03-07 DIAGNOSIS — M25561 Pain in right knee: Secondary | ICD-10-CM

## 2017-03-07 NOTE — Patient Instructions (Addendum)
Walking with 7# DB in both R then L hand   Modified Lunge with HHA   Standing hip abduction on red t-band   Sidestepping with red t-band x 3 laps at TM (progressed to blue t-band x 3 laps x 2 sets)   BOSU hip abductions x 15 with single leg for 2 sets   BOSU step ups with bilateral HHA x 15 with PT controlling for valgus on RLE stance leg   Single leg squats on Total gym level 20 x 12 for 3 sets

## 2017-03-07 NOTE — Therapy (Signed)
[facility] [address] [place], [address] Phone: [phone]   Fax:  [phone]  Physical Therapy Treatment  Patient Details  Name: Maria [name] MRN: [id number] Date of Birth: [birth year] Referring Provider: [name] MD  Encounter Date: 03/07/2017      PT End of Session - 03/07/17 1422    Visit Number 25   Number of Visits 30   Date for PT Re-Evaluation 03/21/17   PT Start Time 1347   PT Stop Time 1430   PT Time Calculation (min) 43 min   Activity Tolerance Patient tolerated treatment well   Behavior During Therapy [facility] for tasks assessed/performed      Past Medical History:  Diagnosis Date  . Allergy   . Arthritis   . Asthma   . Cancer [facility])    chronic lymphacytic leukemia  . Diabetes mellitus   . Hyperlipidemia   . Hypertension   . Hypothyroidism   . Thyroid disease    hypothyroidism    Past Surgical History:  Procedure Laterality Date  . ABDOMINAL HYSTERECTOMY    . LYMPH NODE DISSECTION    . STRABISMUS SURGERY    . TONSILLECTOMY    . TOTAL KNEE ARTHROPLASTY Right 12/03/2016   Procedure: RIGHT TOTAL KNEE ARTHROPLASTY;  Surgeon: [name], MD;  Location: [facility];  Service: Orthopedics;  Laterality: Right;    There were no vitals filed for this visit.      Subjective Assessment - 03/07/17 1401    Subjective Patient reports she has noticed decreased limping with calf stretching. She still gets stiffness in the morning, but pain has been more present in the ankle than the knee.    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 She has a history of diabetes but has been well controlled. history of progressive knee pain x >10 years. underwent surgery for Right TKA 12/03/2016   Limitations Sitting;Standing;Walking;Lifting;House hold activities;Other (comment)  work related activties   Patient Stated Goals To return to walking around without as much pain. Be able to socialize with her friends.    Currently in Pain? Other (Comment)  She reports she gets some stiffness in her knee/ankle in the morning, but this alleviates throughout the day      Walking with 7# DB in both R then L hand x 3 laps around the gym with each, notd to have reduced lateral trunk displacement with weight in R hand, provided this for HEP.   Modified Lunge with HHA -- with bilateral HHA with cuing for stance positioning and flexion of knee(s) appropriately-- difficult for her to complete with correct technique, thus dc'd.   Standing hip abduction on blue foam pad with red t-band -- 3 sets x 10 repetitions bilaterally with HHA and PT providing physical cuing for valgus on RLE   Sidestepping with red t-band x 3 laps at TM (progressed to blue t-band x 3 laps x 2 sets)   BOSU hip abductions x 15 with single leg for 2 sets   BOSU step ups with bilateral HHA x 15 with PT controlling for valgus on RLE stance leg   Single leg squats on Total gym level 20 x 12 for 3 sets                            PT Education -  03/07/17 1422    Education provided Yes   Education Details Walk at home with weight in R hand to reduce her lateral trunk flexion with gait pattern.    Person(s) Educated Patient   Methods Explanation;Demonstration   Comprehension Verbalized understanding;Returned demonstration             PT Long Term Goals - 02/18/17 0804      PT LONG TERM GOAL #1   Title Patient will improve ROM right knee flexion 0-100 degrees by 01/04/2017 to improve transition sit to stand and ambulate with improved gait pattern   Baseline right knee flexion 10-60   Status Achieved     PT LONG TERM GOAL #2   Title Patient will improve ambulation with normal gait pattern and least restrictive AD by 01/11/17 in order to improve household and community ambulation   Baseline ambulating with  forward wheeled walker with guarded posture and decreased hip/knee flexion   Status On-going     PT LONG TERM GOAL #3   Title Patient will demonstrate improved function with daily tasks as indicated by LEFS score of 40/80 by 01/11/2017    Baseline LEFS to be assessed    Status Achieved     PT LONG TERM GOAL #4   Title Patient will be independent with home program for pain control and ROM, strengthening exercises by discharge 01/11/2017 to allow patient to continue improvement with self management     Baseline limited knowledge of pain control strategies, appropriate exercises and progression and requires assistance, moderate cuing    Status Achieved               Plan - 03/07/17 1422    Clinical Impression Statement Patient is able to ambulate with weight in her R hand with minimal frontal plane trunk displacement. Her R quadricep continues to improve in strength, and she reports between compression wrap on her R ankle and calf stretching her ankle swelling and discomfort have been well managed. Patient will be switched over to gym exercise program shortly.    Clinical Presentation Stable   Clinical Decision Making Moderate   Rehab Potential Good   Clinical Impairments Affecting Rehab Potential (+)acute condition right TKA, motivated, pror level of funciton(-)Diabetes, CLL, chronic condition of OA prior to surgery   PT Frequency Other (comment)  2-3x/week   PT Duration 4 weeks   PT Treatment/Interventions Electrical Stimulation;Cryotherapy;Gait training;Stair training;Patient/family education;Neuromuscular re-education;Balance training;Therapeutic exercise;Manual techniques;Scar mobilization   PT Next Visit Plan ROM measures, Manual therapy soft tissue techniques, therapeutic exercise, pain control   PT Home Exercise Plan ROM exercises for right knee, quad sets, glute sets, pain and swelling control   Consulted and Agree with Plan of Care Patient      Patient will benefit from  skilled therapeutic intervention in order to improve the following deficits and impairments:  Decreased strength, Increased edema, Pain, Impaired perceived functional ability, Decreased activity tolerance, Difficulty walking, Decreased range of motion  Visit Diagnosis: Stiffness of right knee, not elsewhere classified  Difficulty in walking, not elsewhere classified  Chronic pain of right knee     Problem List Patient Active Problem List   Diagnosis Date Noted  . CLL (chronic lymphocytic leukemia) ([place]) 07/24/2012  . PURE HYPERCHOLESTEROLEMIA 09/07/2009  . HYPOTHYROIDISM 07/29/2009  . 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 Essential hypertension 07/29/2009  . Asthma 07/29/2009  . OA (osteoarthritis) of knee 07/29/2009  . ELECTROCARDIOGRAM, ABNORMAL 07/29/2009   Royce Macadamia PT, DPT,  CSCS    03/07/2017, 4:13 PM  [facility] [address], [place], [address] Phone: [phone]   Fax:  [phone]  Name: Maria [name] MRN: [id number] Date of Birth: 05-20-1951

## 2017-03-08 ENCOUNTER — Other Ambulatory Visit: Payer: Self-pay | Admitting: *Deleted

## 2017-03-08 MED ORDER — IBRUTINIB 420 MG PO TABS: 1.0000 | ORAL_TABLET | Freq: Every day | ORAL | 2 refills | Status: DC

## 2017-03-12 ENCOUNTER — Ambulatory Visit: Payer: BC Managed Care – PPO | Attending: Orthopedic Surgery | Admitting: Physical Therapy

## 2017-03-12 DIAGNOSIS — R262 Difficulty in walking, not elsewhere classified: Secondary | ICD-10-CM | POA: Insufficient documentation

## 2017-03-12 DIAGNOSIS — M25661 Stiffness of right knee, not elsewhere classified: Secondary | ICD-10-CM | POA: Diagnosis not present

## 2017-03-12 NOTE — Therapy (Signed)
[place] [address], [place], [address] Phone: [phone]   Fax:  [phone]  Physical Therapy Treatment  Patient Details  Name: Maria [name] MRN: [id number] Date of Birth: 06-Dec-1950 Referring Provider: [name] MD  Encounter Date: 03/12/2017      PT End of Session - 03/12/17 1312    Visit Number 26   Number of Visits 30   Date for PT Re-Evaluation 03/21/17   PT Start Time 1300   PT Stop Time 1344   PT Time Calculation (min) 44 min   Activity Tolerance Patient tolerated treatment well   Behavior During Therapy [facility] for tasks assessed/performed      Past Medical History:  Diagnosis Date  . Allergy   . Arthritis   . Asthma   . Cancer [facility])    chronic lymphacytic leukemia  . Diabetes mellitus   . Hyperlipidemia   . Hypertension   . Hypothyroidism   . Thyroid disease    hypothyroidism    Past Surgical History:  Procedure Laterality Date  . ABDOMINAL HYSTERECTOMY    . LYMPH NODE DISSECTION    . STRABISMUS SURGERY    . TONSILLECTOMY    . TOTAL KNEE ARTHROPLASTY Right 12/03/2016   Procedure: RIGHT TOTAL KNEE ARTHROPLASTY;  Surgeon: [name], MD;  Location: [facility];  Service: Orthopedics;  Laterality: Right;    There were no vitals filed for this visit.      Subjective Assessment - 03/12/17 1301    Subjective Patient reports her walking continues to improve week by week, she is still having more pain/stiffness around her ankle (chronic) than her knee.    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 She has a history of diabetes but has been well controlled. history of progressive knee pain x >10 years. underwent surgery for Right TKA 12/03/2016   Limitations Sitting;Standing;Walking;Lifting;House hold activities;Other (comment)  work related activties   Patient Stated Goals To return  to walking around without as much pain. Be able to socialize with her friends.    Currently in Pain? Other (Comment)  Reports her knee is "not bad"        TRX Squats x 12 repetitions through available ROM (good depth, nearly to parallel femurs to floor) x 3 sets (no lateral weight shifting noted)   Gait assessment -- much improved quad control, her lateral trunk lean appears to be a compensation for reduced DF and toe off on her RLE and not as much from hip and quadricep strength deficits as previously observed   Sit to stands without use of UEs x 5 for 3 sets (requires some rocking, but she is able to complete though challenging for her)   Single leg squats on TG level 22- x 12, level 24 (unable) returned to level 22 x 8 for 3 sets   Standing hip abductions with green t-band with bilateral HHA x12 for 3 sets   DF mobilizations at end range with towel roll underneath her ankle x 8 bouts x 30" in each bouts, patient noted slightly less tightness/restriction around the ankle in gait, she did still have altered gait pattern though this appears to be a result of fatigue.                           PT Education - 03/12/17  33    Education provided Yes   Education Details Take photos of equipment at her gym for PT to educate on which exercises and machines to choose.    Person(s) Educated Patient   Methods Explanation;Demonstration   Comprehension Verbalized understanding;Returned demonstration             PT Long Term Goals - 02/18/17 0804      PT LONG TERM GOAL #1   Title Patient will improve ROM right knee flexion 0-100 degrees by 01/04/2017 to improve transition sit to stand and ambulate with improved gait pattern   Baseline right knee flexion 10-60   Status Achieved     PT LONG TERM GOAL #2   Title Patient will improve ambulation with normal gait pattern and least restrictive AD by 01/11/17 in order to improve household and community ambulation   Baseline  ambulating with forward wheeled walker with guarded posture and decreased hip/knee flexion   Status On-going     PT LONG TERM GOAL #3   Title Patient will demonstrate improved function with daily tasks as indicated by LEFS score of 40/80 by 01/11/2017    Baseline LEFS to be assessed    Status Achieved     PT LONG TERM GOAL #4   Title Patient will be independent with home program for pain control and ROM, strengthening exercises by discharge 01/11/2017 to allow patient to continue improvement with self management     Baseline limited knowledge of pain control strategies, appropriate exercises and progression and requires assistance, moderate cuing    Status Achieved               Plan - 03/12/17 1314    Clinical Impression Statement Patient is demonstrating much more normalized gait pattern, her primary deficit now appears to be reduced DF about her R ankle, her musculature in thigh has improved in strength tremendously. She has just joined a gym, will be educated on program to complete and weaned from therapy services to transisition to HEP.    Clinical Presentation Stable   Clinical Decision Making Moderate   Rehab Potential Good   Clinical Impairments Affecting Rehab Potential (+)acute condition right TKA, motivated, pror level of funciton(-)Diabetes, CLL, chronic condition of OA prior to surgery   PT Frequency Other (comment)  2-3x/week   PT Duration 4 weeks   PT Treatment/Interventions Electrical Stimulation;Cryotherapy;Gait training;Stair training;Patient/family education;Neuromuscular re-education;Balance training;Therapeutic exercise;Manual techniques;Scar mobilization   PT Next Visit Plan ROM measures, Manual therapy soft tissue techniques, therapeutic exercise, pain control   PT Home Exercise Plan ROM exercises for right knee, quad sets, glute sets, pain and swelling control   Consulted and Agree with Plan of Care Patient      Patient will benefit from skilled therapeutic  intervention in order to improve the following deficits and impairments:  Decreased strength, Increased edema, Pain, Impaired perceived functional ability, Decreased activity tolerance, Difficulty walking, Decreased range of motion  Visit Diagnosis: Stiffness of right knee, not elsewhere classified  Difficulty in walking, not elsewhere classified     Problem List Patient Active Problem List   Diagnosis Date Noted  . CLL (chronic lymphocytic leukemia) ([place]) 07/24/2012  . PURE HYPERCHOLESTEROLEMIA 09/07/2009  . HYPOTHYROIDISM 07/29/2009  . 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 Essential hypertension 07/29/2009  . Asthma 07/29/2009  . OA (osteoarthritis) of knee 07/29/2009  . ELECTROCARDIOGRAM, ABNORMAL 07/29/2009   Royce Macadamia PT, DPT, CSCS    03/12/2017, 2:28 PM  [place]  [place] [address], [place], [address] Phone: [phone]   Fax:  [phone]  Name: Maria [name] MRN: [id number] Date of Birth: 14-Aug-1951

## 2017-03-12 NOTE — Patient Instructions (Addendum)
TRX Squats  Gait assessment   Sit to stands without use of UEs   Single leg squats on TG level 22- x 12, level 24   Standing hip abductions with green t-band with bilateral HHA x12 for 3 sets   DF mobilizations at end range with towel roll underneath

## 2017-03-14 ENCOUNTER — Ambulatory Visit: Payer: BC Managed Care – PPO | Admitting: Physical Therapy

## 2017-03-14 DIAGNOSIS — M25661 Stiffness of right knee, not elsewhere classified: Secondary | ICD-10-CM

## 2017-03-14 DIAGNOSIS — R262 Difficulty in walking, not elsewhere classified: Secondary | ICD-10-CM

## 2017-03-15 NOTE — Therapy (Signed)
[facility] [address] [place], [address] Phone: [phone]   Fax:  [phone]  Physical Therapy Treatment  Patient Details  Name: Maria [name] MRN: [id number] Date of Birth: 1951/02/11 Referring Provider: [name] MD  Encounter Date: 03/14/2017      PT End of Session - 03/14/17 1746    Visit Number 27   Number of Visits 30   Date for PT Re-Evaluation 03/21/17   PT Start Time 7062   PT Stop Time 1729   PT Time Calculation (min) 25 min   Activity Tolerance Patient tolerated treatment well   Behavior During Therapy [facility] for tasks assessed/performed      Past Medical History:  Diagnosis Date  . Allergy   . Arthritis   . Asthma   . Cancer [facility])    chronic lymphacytic leukemia  . Diabetes mellitus   . Hyperlipidemia   . Hypertension   . Hypothyroidism   . Thyroid disease    hypothyroidism    Past Surgical History:  Procedure Laterality Date  . ABDOMINAL HYSTERECTOMY    . LYMPH NODE DISSECTION    . STRABISMUS SURGERY    . TONSILLECTOMY    . TOTAL KNEE ARTHROPLASTY Right 12/03/2016   Procedure: RIGHT TOTAL KNEE ARTHROPLASTY;  Surgeon: [name], MD;  Location: [facility];  Service: Orthopedics;  Laterality: Right;    There were no vitals filed for this visit.      Subjective Assessment - 03/14/17 1745    Subjective Patient reports she believes she is ready for transition to gym based program as she now has membership to [employer].   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 She has a history of diabetes but has been well controlled. history of progressive knee pain x >10 years. underwent surgery for Right TKA 12/03/2016   Limitations Sitting;Standing;Walking;Lifting;House hold activities;Other (comment)  work related activties   Patient Stated Goals To return to walking around  without as much pain. Be able to socialize with her friends.    Currently in Pain? Other (Comment)  Reports her knee is feeling pretty good relative to pre-op       Observed gait mechanics -- continues to have lateral trunk lean when WBing on RLE appears to be from decreased ankle ROM into DF with circumduction gait to compensate  Performed bout of grade IV mobilizations into DF x 6 repetitions x 30-45" per bout   Provided HEP for gym while answering questions from patient (including) -- patient brought in pictures of her gym equipment to discuss with therapist.  Leg Press Hamstring Curls  Standing calf stretch  Step ups Hip abductions with band/on machine                           PT Education - 03/14/17 1746    Education provided Yes   Education Details HEP and contact information for therapist for follow up in 1 month.    Person(s) Educated Patient   Methods Explanation;Handout;Demonstration   Comprehension Returned demonstration;Verbalized understanding             PT Long Term Goals - 02/18/17 0804      PT LONG TERM GOAL #1   Title Patient will improve ROM right knee flexion 0-100 degrees by 01/04/2017 to improve transition sit to stand and ambulate  with improved gait pattern   Baseline right knee flexion 10-60   Status Achieved     PT LONG TERM GOAL #2   Title Patient will improve ambulation with normal gait pattern and least restrictive AD by 01/11/17 in order to improve household and community ambulation   Baseline ambulating with forward wheeled walker with guarded posture and decreased hip/knee flexion   Status On-going     PT LONG TERM GOAL #3   Title Patient will demonstrate improved function with daily tasks as indicated by LEFS score of 40/80 by 01/11/2017    Baseline LEFS to be assessed    Status Achieved     PT LONG TERM GOAL #4   Title Patient will be independent with home program for pain control and ROM, strengthening exercises by  discharge 01/11/2017 to allow patient to continue improvement with self management     Baseline limited knowledge of pain control strategies, appropriate exercises and progression and requires assistance, moderate cuing    Status Achieved               Plan - 03/14/17 1747    Clinical Impression Statement Patient has made substantial progress with quad strength and gait normalization, her residual deficits appear to be related to decreased R ankle DF ROM. She was provided with an HEP and follow up instructions as she has joined a gym, she appears at a reasonable point for follow up in 1-2 months for any additional gait training needed.    Clinical Presentation Stable   Clinical Decision Making Moderate   Rehab Potential Good   Clinical Impairments Affecting Rehab Potential (+)acute condition right TKA, motivated, pror level of funciton(-)Diabetes, CLL, chronic condition of OA prior to surgery   PT Frequency Other (comment)  2-3x/week   PT Duration 4 weeks   PT Treatment/Interventions Electrical Stimulation;Cryotherapy;Gait training;Stair training;Patient/family education;Neuromuscular re-education;Balance training;Therapeutic exercise;Manual techniques;Scar mobilization   PT Next Visit Plan ROM measures, Manual therapy soft tissue techniques, therapeutic exercise, pain control   PT Home Exercise Plan ROM exercises for right knee, quad sets, glute sets, pain and swelling control   Consulted and Agree with Plan of Care Patient      Patient will benefit from skilled therapeutic intervention in order to improve the following deficits and impairments:  Decreased strength, Increased edema, Pain, Impaired perceived functional ability, Decreased activity tolerance, Difficulty walking, Decreased range of motion  Visit Diagnosis: Stiffness of right knee, not elsewhere classified  Difficulty in walking, not elsewhere classified     Problem List Patient Active Problem List   Diagnosis Date  Noted  . CLL (chronic lymphocytic leukemia) ([place]) 07/24/2012  . PURE HYPERCHOLESTEROLEMIA 09/07/2009  . HYPOTHYROIDISM 07/29/2009  . 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 Essential hypertension 07/29/2009  . Asthma 07/29/2009  . OA (osteoarthritis) of knee 07/29/2009  . ELECTROCARDIOGRAM, ABNORMAL 07/29/2009    Royce Macadamia PT, DPT, CSCS    03/15/2017, 11:09 AM  [facility] [address], [place], [address] Phone: [phone]   Fax:  [phone]  Name: DAO MEARNS MRN: [id number] Date of Birth: 1951-03-09

## 2017-03-18 ENCOUNTER — Ambulatory Visit: Payer: BC Managed Care – PPO | Admitting: Physical Therapy

## 2017-03-21 ENCOUNTER — Ambulatory Visit: Payer: BC Managed Care – PPO | Admitting: Physical Therapy

## 2017-03-26 ENCOUNTER — Ambulatory Visit: Payer: BC Managed Care – PPO | Admitting: Physical Therapy

## 2017-03-28 ENCOUNTER — Encounter: Payer: BC Managed Care – PPO | Admitting: Physical Therapy

## 2017-05-28 ENCOUNTER — Other Ambulatory Visit: Payer: Self-pay

## 2017-05-28 MED ORDER — IBRUTINIB 420 MG PO TABS: 1.0000 | ORAL_TABLET | Freq: Every day | ORAL | 2 refills | Status: DC

## 2017-07-30 NOTE — Progress Notes (Signed)
ID: ROSELL KHOURI   DOB: Nov 29, 1950  MR#: [id number]  KGM#:[id number]  Patient Care Team: Marton Redwood, MD as PCP - General (Internal Medicine) [name], MD as Consulting Physician (Orthopedic Surgery) [name], [name] Dad, MD as Consulting Physician (Oncology)   CHIEF COMPLAINT: Small lymphocytic lymphoma  CURRENT TREATMENT: Ibrutinib   HISTORY OF PRESENT ILLNESS: From the original intake note:  Maria [name] developed what she thought was "the flu" in December of 2002. She noted a large lymph node developing in her right anterior cervical area. 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   Her subsequent history is as detailed below   INTERVAL HISTORY: Maria [name] returns today for follow-up and treatment of her chronic lymphoid leukemia.  She continues on ibrutinib, with good tolerance. She takes 1 tablet of 420 mg of Ibrutinib, which she is pleased with this dosage. She receives this at no cost, but only pays $10 to have it mailed to her house. She reports that she needs a refill of her Ibrutinib at this time.     REVIEW OF SYSTEMS: Maria [name] reports that for exercise, she completes exercise classes at the [facility] in [place], [place]. She has a Higher education careers adviser at [employer]. She has to have oral surgery due to a fractured tooth at the gumline and her Oral Surgeon, Dr. Berdine Addison in [place], [place] wanted this to be discussed during today's visit.  She denies having enlarged lymph nodes. She denies drenching sweats. She has right leg swelling with fluid retention. She denies unusual headaches, visual changes, nausea, vomiting, or dizziness. There has been no unusual cough, phlegm production, or pleurisy. This been no change in bowel or bladder habits. She denies unexplained fatigue or unexplained weight loss, bleeding, rash, or fever. A detailed review of systems was otherwise stable.    PAST MEDICAL HISTORY: Past Medical  History:  Diagnosis Date  . Allergy   . Arthritis   . Asthma   . Cancer [facility])    chronic lymphacytic leukemia  . Diabetes mellitus   . Hyperlipidemia   . Hypertension   . Hypothyroidism   . Thyroid disease    hypothyroidism    PAST SURGICAL HISTORY: Past Surgical History:  Procedure Laterality Date  . ABDOMINAL HYSTERECTOMY    . LYMPH NODE DISSECTION    . STRABISMUS SURGERY    . TONSILLECTOMY    . TOTAL KNEE ARTHROPLASTY Right 12/03/2016   Procedure: RIGHT TOTAL KNEE ARTHROPLASTY;  Surgeon: [name], MD;  Location: [facility];  Service: Orthopedics;  Laterality: Right;    FAMILY HISTORY Family History  Problem Relation Age of Onset  . Liver cancer Mother   . Heart disease Father   . Breast cancer Sister   . Colon cancer Neg Hx   . Pancreatic cancer Neg Hx   . Rectal cancer Neg Hx   . Stomach cancer Neg Hx    The patient's father died at the age of 57 from a myocardial infarction. The patient's mother died at the age of 4 from primary liver carcinoma. The patient had no brothers. Her one sister, [name], has a history of breast cancer  GYNECOLOGIC HISTORY: Menarche at around age 74. The patient is GX P0. 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 She lives by herself with her  cats Lorre Nick and [place]. She attends a local [place]: Not in place  HEALTH MAINTENANCE: Social History   Tobacco Use  . Smoking status: Never Smoker  . Smokeless tobacco: Never Used  Substance Use Topics  . Alcohol use: Yes    Comment: occasional alcohol intake; once or twice monthly  . Drug use: No     Colonoscopy: 06/01/2014/[facility]  PAP:  Bone density:  Lipid panel:  228/114/50/155/ratio 4.6  On 10/01/2016  Allergies  Allergen Reactions  . Cephalexin     REACTION: Rash  .  Lactose Intolerance (Gi)     Diarrhea, gas bloating  . Iohexol Swelling and Rash    Current Outpatient Medications  Medication Sig Dispense Refill  . Ergocalciferol (VITAMIN D2 PO) Take 1,000 Units by mouth daily.    . hydrochlorothiazide (HYDRODIURIL) 12.5 MG tablet Take 12.5 mg by mouth every morning.    . Ibrutinib (IMBRUVICA) 420 MG TABS Take 1 tablet by mouth at bedtime. 28 tablet 2  . SYNTHROID 88 MCG tablet Take 88 mcg by mouth daily before breakfast.      No current facility-administered medications for this visit.     OBJECTIVE: Middle-aged white woman still walking with a limp  Vitals:   08/05/17 1406  BP: (!) 141/54  [name]: 62  Resp: 17  Temp: 97.8 F (36.6 C)  SpO2: 100%     Body mass index is 33.8 kg/m.    ECOG FS: 1  Sclerae unicteric, pupils round and equal Oropharynx clear and moist No cervical or supraclavicular adenopathy, no axillary or inguinal adenoma Lungs no rales or rhonchi Heart regular rate and rhythm Abd soft, nontender, positive bowel sounds MSK no focal spinal tenderness, no upper extremity lymphedema Neuro: nonfocal, well oriented, appropriate affect Breasts: Deferred   LAB RESULTS:  Lab Results  Component Value Date   WBC 6.1 08/05/2017   NEUTROABS 3.5 08/05/2017   HGB 13.9 08/05/2017   HCT 42.9 08/05/2017   MCV 83.1 08/05/2017   PLT 162 08/05/2017      Chemistry      Component Value Date/Time   NA 142 02/07/2017 1327   K 4.4 02/07/2017 1327   CL 103 12/05/2016 0413   CO2 29 02/07/2017 1327   BUN 14.7 02/07/2017 1327   CREATININE 1.0 02/07/2017 1327   GLU 101 (H) 10/18/2009 1542      Component Value Date/Time   CALCIUM 10.1 02/07/2017 1327   ALKPHOS 161 (H) 02/07/2017 1327   AST 35 (H) 02/07/2017 1327   ALT 34 02/07/2017 1327   BILITOT 0.72 02/07/2017 1327       No results found for: LABCA2  No components found for: [id number]  No results for input(s): INR in the last 168 hours.  Urinalysis    Component Value  Date/Time   COLORURINE LT. 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 There was no CD10 or cyclin D1 positivity identified  (4) started ibrutinib at 420 mg/ day 08/09/2014  PLAN:  Jendayi is now 3 years into her treatment with ibrutinib.  She is tolerating it well, obtaining it as a good price, and she is in complete clinical remission.  There are concerns regarding stopping ibrutinib.  This is particularly a problem if the disease is not controlled.  In patients like her, stopping it a few days prior to surgery is acceptable.  There is no real consensus whether he should be 3 days or 7 days.  I suggest that she stop her ibrutinib 5 days preop.  Hopefully she will have no excessive bleeding from her Upcoming oral surgery.  She should start the ibrutinib the next day assuming she is not having significant bleeding problems  She will be seeing Dr. Brigitte [name] in February.  We will lab work here in May.  She likely will see Dr. Brigitte [name] again in August and then she will see me again a year I have strongly encouraged her to  change her diet and gave her information on how to do that.  I have also commended her on her exercise program, especially as she plans to develop it  She knows to call for any other issues that may develop before her next visit here.  [name], [name] Dad, MD  08/05/17 2:16 PM Medical Oncology and Hematology [facility] [address] [place], [place] [address] Tel. [phone]    Fax. [phone]    This document serves as a record of services personally performed by Lurline Del, MD. It was created on his behalf by Steva Colder, a trained medical scribe. The creation of this record is based on the scribe's personal observations and the provider's statements to them.   I have reviewed the above documentation for accuracy and completeness, and I agree with the above.

## 2017-08-05 ENCOUNTER — Ambulatory Visit (HOSPITAL_BASED_OUTPATIENT_CLINIC_OR_DEPARTMENT_OTHER): Payer: Medicare Other | Admitting: Oncology

## 2017-08-05 ENCOUNTER — Telehealth: Payer: Self-pay | Admitting: Oncology

## 2017-08-05 ENCOUNTER — Other Ambulatory Visit (HOSPITAL_BASED_OUTPATIENT_CLINIC_OR_DEPARTMENT_OTHER): Payer: Medicare Other

## 2017-08-05 VITALS — BP 141/54 | HR 62 | Temp 97.8°F | Resp 17 | Ht 66.0 in | Wt 209.4 lb

## 2017-08-05 DIAGNOSIS — C9190 Lymphoid leukemia, unspecified not having achieved remission: Secondary | ICD-10-CM | POA: Diagnosis not present

## 2017-08-05 DIAGNOSIS — C9110 Chronic lymphocytic leukemia of B-cell type not having achieved remission: Secondary | ICD-10-CM | POA: Diagnosis not present

## 2017-08-05 LAB — CBC WITH DIFFERENTIAL/PLATELET
BASO%: 0.2 % (ref 0.0–2.0)
Basophils Absolute: 0 10*3/uL (ref 0.0–0.1)
EOS ABS: 0.1 10*3/uL (ref 0.0–0.5)
EOS%: 2 % (ref 0.0–7.0)
HCT: 42.9 % (ref 34.8–46.6)
HEMOGLOBIN: 13.9 g/dL (ref 11.6–15.9)
LYMPH%: 35 % (ref 14.0–49.7)
MCH: 26.9 pg (ref 25.1–34.0)
MCHC: 32.4 g/dL (ref 31.5–36.0)
MCV: 83.1 fL (ref 79.5–101.0)
MONO#: 0.4 10*3/uL (ref 0.1–0.9)
MONO%: 5.9 % (ref 0.0–14.0)
NEUT%: 56.9 % (ref 38.4–76.8)
NEUTROS ABS: 3.5 10*3/uL (ref 1.5–6.5)
PLATELETS: 162 10*3/uL (ref 145–400)
RBC: 5.16 10*6/uL (ref 3.70–5.45)
RDW: 15.1 % — AB (ref 11.2–14.5)
WBC: 6.1 10*3/uL (ref 3.9–10.3)
lymph#: 2.1 10*3/uL (ref 0.9–3.3)

## 2017-08-05 LAB — LACTATE DEHYDROGENASE
LDH: 155 U/L (ref 125–245)
LDH: 158 U/L (ref 125–245)

## 2017-08-05 LAB — COMPREHENSIVE METABOLIC PANEL
ALBUMIN: 3.8 g/dL (ref 3.5–5.0)
ALK PHOS: 76 U/L (ref 40–150)
ALT: 14 U/L (ref 0–55)
ANION GAP: 9 meq/L (ref 3–11)
AST: 18 U/L (ref 5–34)
BUN: 17.3 mg/dL (ref 7.0–26.0)
CALCIUM: 9.2 mg/dL (ref 8.4–10.4)
CHLORIDE: 103 meq/L (ref 98–109)
CO2: 27 mEq/L (ref 22–29)
CREATININE: 1 mg/dL (ref 0.6–1.1)
EGFR: >60 mL/min/{1.73_m2} (ref >60)
Glucose: 91 mg/dl (ref 70–140)
Potassium: 3.7 mEq/L (ref 3.5–5.1)
Sodium: 139 mEq/L (ref 136–145)
Total Bilirubin: 0.59 mg/dL (ref 0.20–1.20)
Total Protein: 6.3 g/dL — ABNORMAL LOW (ref 6.4–8.3)

## 2017-08-05 NOTE — Telephone Encounter (Signed)
Gave patient avs report and appointments for May and November 2019.

## 2017-08-06 LAB — IGG, IGA, IGM
IGG (IMMUNOGLOBIN G), SERUM: 479 mg/dL — AB (ref 700–1600)
IGM (IMMUNOGLOBIN M), SRM: 14 mg/dL — AB (ref 26–217)
IgA, Qn, Serum: 181 mg/dL (ref 87–352)

## 2017-08-08 ENCOUNTER — Other Ambulatory Visit: Payer: Self-pay | Admitting: *Deleted

## 2017-08-21 ENCOUNTER — Other Ambulatory Visit: Payer: Self-pay | Admitting: *Deleted

## 2017-08-21 MED ORDER — IBRUTINIB 420 MG PO TABS: 1.0000 | ORAL_TABLET | Freq: Every day | ORAL | 2 refills | Status: DC

## 2017-10-17 ENCOUNTER — Encounter: Payer: Self-pay | Admitting: Oncology

## 2017-11-19 ENCOUNTER — Other Ambulatory Visit: Payer: Self-pay | Admitting: *Deleted

## 2017-11-19 MED ORDER — IBRUTINIB 420 MG PO TABS: 1.0000 | ORAL_TABLET | Freq: Every day | ORAL | 2 refills | Status: DC

## 2017-11-20 ENCOUNTER — Other Ambulatory Visit: Payer: Self-pay | Admitting: Oncology

## 2017-11-20 NOTE — Progress Notes (Unsigned)
I received the third note and lab work on [place] from Dr. Brigitte Pulse.  On 10/17/2017 her white cell count was 6.74, hemoglobin 15.0, platelets 137,000, absolute lymphocyte count 2.4.  See med was entirely normal

## 2018-01-27 ENCOUNTER — Inpatient Hospital Stay: Payer: Medicare Other | Attending: Oncology

## 2018-02-10 ENCOUNTER — Other Ambulatory Visit: Payer: Self-pay | Admitting: *Deleted

## 2018-03-20 ENCOUNTER — Encounter: Payer: Self-pay | Admitting: Podiatry

## 2018-03-20 ENCOUNTER — Ambulatory Visit: Payer: Medicare Other | Admitting: Podiatry

## 2018-03-20 VITALS — BP 137/71 | HR 67

## 2018-03-20 DIAGNOSIS — L608 Other nail disorders: Secondary | ICD-10-CM

## 2018-03-20 DIAGNOSIS — T148XXA Other injury of unspecified body region, initial encounter: Secondary | ICD-10-CM

## 2018-03-20 DIAGNOSIS — G629 Polyneuropathy, unspecified: Secondary | ICD-10-CM | POA: Diagnosis not present

## 2018-03-20 NOTE — Progress Notes (Signed)
His patient presents the office for an evaluation of her left big toenail.  She states that she stubbed her toenail and she developed a bloody blister along the inside border big toenail, left foot.  She says it was initially painful but it has become non-painful today.  She denies any drainage from the site, but desires to have this site evaluated and treated  This .patient does state that she is diabetic, but is diet controlled.  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  Patient states that she takes biotech and to help improve the strength of her nail plates.  General Appearance  Alert, conversant and in no acute stress.  Vascular  Dorsalis pedis and posterior tibial  pulses are palpable  bilaterally.  Capillary return is within normal limits  bilaterally. Temperature is within normal limits  bilaterally.  Neurologic  Senn-Weinstein monofilament wire test within normal limits  bilaterally. Muscle power within normal limits bilaterally.  Nails  Pincer toenails noted on multiple toes with severe pincer formation hallux nail  B/L.  Hematoma distal aspect medial border left hallux.  Orthopedic  No limitations of motion of motion feet .  No crepitus or effusions noted.  No bony pathology or digital deformities noted.  Skin  normotropic skin with no porokeratosis noted bilaterally.  No signs of infections or ulcers noted.    Hematoma secondary to pincer nails.  IE  Debridement of left hallux toenail and removal of hematoma.  Patient was told there was no need to soak since there is no drainage.  Return to clinic when necessary.  This patient developed a hematoma when she jammed her left big toe. Due to her pincer toenails.   Gardiner Barefoot DPM

## 2018-04-11 ENCOUNTER — Other Ambulatory Visit: Payer: Self-pay | Admitting: *Deleted

## 2018-04-11 MED ORDER — IBRUTINIB 420 MG PO TABS: 1.0000 | ORAL_TABLET | Freq: Every day | ORAL | 2 refills | Status: DC

## 2018-05-14 ENCOUNTER — Encounter: Payer: Self-pay | Admitting: Oncology

## 2018-05-14 ENCOUNTER — Other Ambulatory Visit: Payer: Self-pay | Admitting: Oncology

## 2018-06-25 ENCOUNTER — Other Ambulatory Visit (HOSPITAL_COMMUNITY): Payer: Self-pay

## 2018-06-26 ENCOUNTER — Ambulatory Visit (HOSPITAL_COMMUNITY)
Admission: RE | Admit: 2018-06-26 | Discharge: 2018-06-26 | Disposition: A | Discharge status: Home / Self Care | Payer: Medicare Other | Source: Ambulatory Visit | Attending: Internal Medicine | Admitting: Internal Medicine

## 2018-06-26 DIAGNOSIS — M810 Age-related osteoporosis without current pathological fracture: Secondary | ICD-10-CM | POA: Diagnosis not present

## 2018-06-26 MED ORDER — ZOLEDRONIC ACID 5 MG/100ML IV SOLN
INTRAVENOUS | Status: AC
  Administered 2018-06-26: 11:00:00 5 mg via INTRAVENOUS
  Filled 2018-06-26: qty 100

## 2018-06-26 MED ORDER — ZOLEDRONIC ACID 5 MG/100ML IV SOLN
5.0000 mg | Freq: Once | INTRAVENOUS | Status: AC
  Administered 2018-06-26: 5 mg via INTRAVENOUS

## 2018-06-26 NOTE — Discharge Instructions (Signed)

## 2018-07-11 NOTE — Progress Notes (Signed)
ID: [name]   DOB: 30-Dec-1950  MR#: [id number]  JSH#:[id number]  Patient Care Team: Marton Redwood, MD as PCP - General (Internal Medicine) [name], MD as Consulting Physician (Orthopedic Surgery) [name], [name] Dad, MD as Consulting Physician (Oncology)   CHIEF COMPLAINT: Small lymphocytic lymphoma  CURRENT TREATMENT: Ibrutinib   HISTORY OF PRESENT ILLNESS: From the original intake note:  Maria [name] developed what she thought was "the flu" in December of 2002. She noted a large lymph node developing in her right anterior cervical area. 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   Her subsequent history is as detailed below   INTERVAL HISTORY: Maria [name] returns today for follow-up and treatment of her chronic lymphoid leukemia. She continues on ibrutinib which she is tolerating well.  About a month ago she noted a small lymph node in her left neck.  This persists.  She then felt a little bit tired.  She started herself on B12 supplementation and that took care of that problem.  She has not had drenching sweats, unexplained fatigue, or unexplained weight loss.  She has lost 12 pounds but she has changed her diet and is exercising regularly.    She is tolerating ibrutinib well, with no side effects that she is aware of   REVIEW OF SYSTEMS: Julisa reports that for exercise, she completes exercise classes at the [facility] in [place], [place]. Back in August started a whole food plant based diet and had been fatigued but took a B-12 supplements which helped. Weight has been decreasing. She has slight runny nose due to extracurricular activities but isn't concerned. She denies unusual headaches, visual changes, nausea, vomiting, or dizziness. There has been no unusual cough, phlegm production, or pleurisy. This been no change in bowel or bladder habits. She denies unexplained fatigue or unexplained weight loss,  bleeding, rash, or fever. A detailed review of systems was otherwise stable.   PAST MEDICAL HISTORY: Past Medical History:  Diagnosis Date  . Allergy   . Arthritis   . Asthma   . Cancer [facility])    chronic lymphacytic leukemia  . Diabetes mellitus   . Hyperlipidemia   . Hypertension   . Hypothyroidism   . Thyroid disease    hypothyroidism    PAST SURGICAL HISTORY: Past Surgical History:  Procedure Laterality Date  . ABDOMINAL HYSTERECTOMY    . LYMPH NODE DISSECTION    . STRABISMUS SURGERY    . TONSILLECTOMY    . TOTAL KNEE ARTHROPLASTY Right 12/03/2016   Procedure: RIGHT TOTAL KNEE ARTHROPLASTY;  Surgeon: [name], MD;  Location: [facility];  Service: Orthopedics;  Laterality: Right;    FAMILY HISTORY Family History  Problem Relation Age of Onset  . Liver cancer Mother   . Heart disease Father   . Breast cancer Sister   . Colon cancer Neg Hx   . Pancreatic cancer Neg Hx   . Rectal cancer Neg Hx   . Stomach cancer Neg Hx    The patient's father died at the age of 29 from a myocardial infarction. The patient's mother died at the age of 56 from primary liver carcinoma. The patient had no brothers. Her one sister, Maria [name], has a history of breast cancer  GYNECOLOGIC HISTORY: Menarche at around age 31. The patient is GX P0. 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 She lives by herself with her cats [place] and [place]. She attends a local [place]: Not in place  HEALTH MAINTENANCE: Social History   Tobacco Use  . Smoking status: Never Smoker  . Smokeless tobacco: Never Used  Substance Use Topics  . Alcohol use: Yes    Comment: occasional alcohol intake; once or twice monthly  . Drug use: No     Colonoscopy: 06/01/2014/[facility]  PAP:  Bone density:  Lipid panel:   228/114/50/155/ratio 4.6  On 10/01/2016  Allergies  Allergen Reactions  . Iohexol Swelling and Rash  . Cephalexin     REACTION: Rash  . Contrast Media  [Iodinated Diagnostic Agents]   . Lactose Intolerance (Gi)     Diarrhea, gas bloating  . Lactulose     Diarrhea, gas bloating    Current Outpatient Medications  Medication Sig Dispense Refill  . Ergocalciferol (VITAMIN D2 PO) Take 1,000 Units by mouth daily.    . hydrochlorothiazide (HYDRODIURIL) 12.5 MG tablet Take 12.5 mg by mouth every morning.    . Ibrutinib (IMBRUVICA) 420 MG TABS Take 1 tablet by mouth at bedtime. 28 tablet 2  . SYNTHROID 88 MCG tablet Take 88 mcg by mouth daily before breakfast.      No current facility-administered medications for this visit.     OBJECTIVE: Middle-aged white woman who appears well  Vitals:   07/14/18 1325  BP: 130/60  [name]: 63  Resp: 19  Temp: 98.4 F (36.9 C)  SpO2: 99%     Body mass index is 30.88 kg/m.    ECOG FS: 0  Sclerae unicteric, EOMs intact Oropharynx clear and moist The right neck is benign; there is a small, 1 cm flat soft lymph node in the left neck area; there are bilateral axillary lymph nodes, measuring 1-1/2 to 2 cm. Lungs no rales or rhonchi Heart regular rate and rhythm Abd soft, nontender, positive bowel sounds no palpable splenomegaly MSK no focal spinal tenderness, no upper extremity lymphedema Neuro: nonfocal, well oriented, appropriate affect Breasts: No suspicious masses palpated in either breast    LAB RESULTS:  Lab Results  Component Value Date   WBC 12.5 (H) 07/14/2018   NEUTROABS 4.0 07/14/2018   HGB 13.6 07/14/2018   HCT 43.2 07/14/2018   MCV 88.5 07/14/2018   PLT 153 07/14/2018      Chemistry      Component Value Date/Time   NA 141 07/14/2018 1257   NA 139 08/05/2017 1353   K 4.0 07/14/2018 1257   K 3.7 08/05/2017 1353   CL 104 07/14/2018 1257   CO2 28 07/14/2018 1257   CO2 27 08/05/2017 1353   BUN 11 07/14/2018 1257   BUN  17.3 08/05/2017 1353   CREATININE 1.09 (H) 07/14/2018 1257   CREATININE 1.0 08/05/2017 1353   GLU 101 (H) 10/18/2009 1542      Component Value Date/Time   CALCIUM 9.3 07/14/2018 1257   CALCIUM 9.2 08/05/2017 1353   ALKPHOS 74 07/14/2018 1257   ALKPHOS 76 08/05/2017 1353   AST 15 07/14/2018 1257   AST 18 08/05/2017 1353   ALT 8 07/14/2018 1257   ALT 14 08/05/2017 1353   BILITOT 0.7 07/14/2018 1257   BILITOT 0.59 08/05/2017 1353       No results found for: LABCA2  No components found for: LABCA125  No results for input(s): INR in the last 168 hours.  Urinalysis  Component Value Date/Time   COLORURINE LT. YELLOW 01/24/2010 1017   APPEARANCEUR CLEAR 01/24/2010 1017   LABSPEC <=1.005 01/24/2010 1017   PHURINE 5.5 01/24/2010 1017   GLUCOSEU NEGATIVE 01/24/2010 [place] 01/24/2010 1017   KETONESUR NEGATIVE 01/24/2010 1017   UROBILINOGEN 0.2 01/24/2010 1017   NITRITE NEGATIVE 01/24/2010 1017   LEUKOCYTESUR NEGATIVE 01/24/2010 1017    STUDIES: Last PET scan was 2 years ago. 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 There was no CD10 or cyclin D1 positivity identified  (4) started ibrutinib at 420 mg/ day 08/09/2014  PLAN:  Jnya is now 4 years out from starting ibrutinib.  She appears to be having some progression, with new adenopathy in both axillae and the left neck, and a slight increase in her total absolute lymphocyte  count.  This could indicate simple resistance developing in which case we would add rituximab to her treatment.  On the other hand this could indicate transformation to a more aggressive lymphoma in which case she might need chemotherapy.  We are going to obtain flow cytometry today and a PET scan as soon as possible.  Depending on those results we will consider whether she needs to have lymph node biopsy and/or bone marrow biopsy.  She is planning to visit family during the Thanksgiving holiday.  I will see her shortly after that at which time we will be able to decide whether or not to change therapy.  She knows to call for any other issues that may develop before that visit. Lanisa Ishler, [name] Dad, MD  07/14/18 2:02 PM Medical Oncology and Hematology [facility] [address] [place], [place] [address] Tel. [phone]    Fax. [phone]    Elie Goody, am acting as scribe for Dr. [name] Dad. Laken Lobato.  I, Lurline Del MD, have reviewed the above documentation for accuracy and completeness, and I agree with the above.

## 2018-07-14 ENCOUNTER — Inpatient Hospital Stay: Payer: Medicare Other

## 2018-07-14 ENCOUNTER — Telehealth: Payer: Self-pay | Admitting: Oncology

## 2018-07-14 ENCOUNTER — Inpatient Hospital Stay: Payer: Medicare Other | Attending: Oncology | Admitting: Oncology

## 2018-07-14 VITALS — BP 130/60 | HR 63 | Temp 98.4°F | Resp 19 | Ht 66.75 in | Wt 195.7 lb

## 2018-07-14 DIAGNOSIS — C9110 Chronic lymphocytic leukemia of B-cell type not having achieved remission: Secondary | ICD-10-CM

## 2018-07-14 DIAGNOSIS — Z79899 Other long term (current) drug therapy: Secondary | ICD-10-CM | POA: Diagnosis not present

## 2018-07-14 DIAGNOSIS — R599 Enlarged lymph nodes, unspecified: Secondary | ICD-10-CM | POA: Insufficient documentation

## 2018-07-14 DIAGNOSIS — Z7189 Other specified counseling: Secondary | ICD-10-CM

## 2018-07-14 LAB — CBC WITH DIFFERENTIAL/PLATELET
BAND NEUTROPHILS: 1 %
BASOS ABS: 0.3 10*3/uL — AB (ref 0.0–0.1)
BASOS PCT: 2 %
Eosinophils Absolute: 0.4 10*3/uL (ref 0.0–0.5)
Eosinophils Relative: 3 %
HEMATOCRIT: 43.2 % (ref 36.0–46.0)
HEMOGLOBIN: 13.6 g/dL (ref 12.0–15.0)
LYMPHS ABS: 7.1 10*3/uL — AB (ref 0.7–4.0)
LYMPHS PCT: 57 %
MCH: 27.9 pg (ref 26.0–34.0)
MCHC: 31.5 g/dL (ref 30.0–36.0)
MCV: 88.5 fL (ref 80.0–100.0)
Monocytes Absolute: 0.8 10*3/uL (ref 0.1–1.0)
Monocytes Relative: 6 %
NEUTROS PCT: 31 %
NRBC: 0 % (ref 0.0–0.2)
Neutro Abs: 4 10*3/uL (ref 1.7–17.7)
Platelets: 153 10*3/uL (ref 150–400)
RBC: 4.88 MIL/uL (ref 3.87–5.11)
RDW: 15.3 % (ref 11.5–15.5)
WBC: 12.5 10*3/uL — ABNORMAL HIGH (ref 4.0–10.5)

## 2018-07-14 LAB — COMPREHENSIVE METABOLIC PANEL
ALBUMIN: 3.9 g/dL (ref 3.5–5.0)
ALK PHOS: 74 U/L (ref 38–126)
ALT: 8 U/L (ref 0–44)
ANION GAP: 9 (ref 5–15)
AST: 15 U/L (ref 15–41)
BUN: 11 mg/dL (ref 8–23)
CALCIUM: 9.3 mg/dL (ref 8.9–10.3)
CHLORIDE: 104 mmol/L (ref 98–111)
CO2: 28 mmol/L (ref 22–32)
Creatinine, Ser: 1.09 mg/dL — ABNORMAL HIGH (ref 0.44–1.00)
GFR calc non Af Amer: 51 mL/min — ABNORMAL LOW (ref >60)
GFR, EST AFRICAN AMERICAN: 59 mL/min — AB (ref >60)
GLUCOSE: 82 mg/dL (ref 70–99)
Potassium: 4 mmol/L (ref 3.5–5.1)
SODIUM: 141 mmol/L (ref 135–145)
Total Bilirubin: 0.7 mg/dL (ref 0.3–1.2)
Total Protein: 6.3 g/dL — ABNORMAL LOW (ref 6.5–8.1)

## 2018-07-14 LAB — LACTATE DEHYDROGENASE: LDH: 216 U/L — AB (ref 98–192)

## 2018-07-14 LAB — SAVE SMEAR(SSMR), FOR PROVIDER SLIDE REVIEW

## 2018-07-14 NOTE — Telephone Encounter (Signed)
Per 11/4 los, lab add on for today.  Gave patient avs and calendar.

## 2018-07-15 LAB — IGG, IGA, IGM
IGG (IMMUNOGLOBIN G), SERUM: 414 mg/dL — AB (ref 700–1600)
IGM (IMMUNOGLOBULIN M), SRM: 7 mg/dL — AB (ref 26–217)
IgA: 144 mg/dL (ref 87–352)

## 2018-07-17 ENCOUNTER — Other Ambulatory Visit: Payer: Self-pay | Admitting: Oncology

## 2018-07-17 DIAGNOSIS — C8299 Follicular lymphoma, unspecified, extranodal and solid organ sites: Secondary | ICD-10-CM

## 2018-07-17 LAB — FLOW CYTOMETRY

## 2018-07-17 NOTE — Progress Notes (Signed)
I called Maria [name] with her results to date.  We are seeing some large cells, but it is not clear whether these are simply intermixed or whether she is actually transforming.  Ideally we would obtain a full lymph node but we are going to go with axillary lymph node core biopsies initially and that might give [place] the answer.  If not I will have to send her to surgery.  Her PET scan has been scheduled for 08/21/2018.

## 2018-07-23 ENCOUNTER — Ambulatory Visit (HOSPITAL_COMMUNITY)
Admission: RE | Admit: 2018-07-23 | Discharge: 2018-07-23 | Disposition: A | Discharge status: Home / Self Care | Payer: Medicare Other | Source: Ambulatory Visit | Attending: Oncology | Admitting: Oncology

## 2018-07-23 ENCOUNTER — Encounter: Payer: Self-pay | Admitting: Oncology

## 2018-07-23 DIAGNOSIS — C9110 Chronic lymphocytic leukemia of B-cell type not having achieved remission: Secondary | ICD-10-CM | POA: Diagnosis not present

## 2018-07-23 LAB — GLUCOSE, CAPILLARY: Glucose-Capillary: 82 mg/dL (ref 70–99)

## 2018-07-23 MED ORDER — FLUDEOXYGLUCOSE F - 18 (FDG) INJECTION
9.9500 | Freq: Once | INTRAVENOUS | Status: AC | PRN
  Administered 2018-07-23: 9.95 via INTRAVENOUS

## 2018-07-24 ENCOUNTER — Other Ambulatory Visit: Payer: Self-pay | Admitting: Oncology

## 2018-07-24 NOTE — Progress Notes (Unsigned)
I called Maria [name] with the results of her PET scan.  She needs to have the right axillary lymph nodes biopsied ASAP.  We will try to move that along tomorrow.

## 2018-07-26 ENCOUNTER — Other Ambulatory Visit: Payer: Self-pay | Admitting: Oncology

## 2018-07-31 ENCOUNTER — Telehealth: Payer: Self-pay | Admitting: *Deleted

## 2018-07-31 NOTE — Telephone Encounter (Signed)
Pt left VM requesting return call from MD per her review of PET scan she can see per My Chart.  She understands of need for biopsy and scheduled visit 12/6 " but I just have some concerns when I read this that I was hoping to discuss before the appointment."  Return call number given as [phone].  This RN returned call to pt - and obtained identified VM- message left.

## 2018-08-01 ENCOUNTER — Other Ambulatory Visit: Payer: Self-pay | Admitting: Oncology

## 2018-08-01 DIAGNOSIS — C8299 Follicular lymphoma, unspecified, extranodal and solid organ sites: Secondary | ICD-10-CM

## 2018-08-04 ENCOUNTER — Other Ambulatory Visit: Payer: Self-pay | Admitting: Oncology

## 2018-08-04 DIAGNOSIS — C8299 Follicular lymphoma, unspecified, extranodal and solid organ sites: Secondary | ICD-10-CM

## 2018-08-05 ENCOUNTER — Other Ambulatory Visit: Payer: Self-pay

## 2018-08-05 ENCOUNTER — Other Ambulatory Visit: Payer: Self-pay | Admitting: Oncology

## 2018-08-05 DIAGNOSIS — C8299 Follicular lymphoma, unspecified, extranodal and solid organ sites: Secondary | ICD-10-CM

## 2018-08-06 ENCOUNTER — Other Ambulatory Visit: Payer: Self-pay | Admitting: Adult Health

## 2018-08-06 DIAGNOSIS — C8299 Follicular lymphoma, unspecified, extranodal and solid organ sites: Secondary | ICD-10-CM

## 2018-08-11 ENCOUNTER — Other Ambulatory Visit: Payer: Medicare Other

## 2018-08-11 ENCOUNTER — Other Ambulatory Visit: Payer: Self-pay | Admitting: Adult Health

## 2018-08-11 ENCOUNTER — Ambulatory Visit
Admission: RE | Admit: 2018-08-11 | Discharge: 2018-08-11 | Disposition: A | Discharge status: Home / Self Care | Payer: Medicare Other | Source: Ambulatory Visit | Attending: Adult Health | Admitting: Adult Health

## 2018-08-11 ENCOUNTER — Ambulatory Visit
Admission: RE | Admit: 2018-08-11 | Discharge: 2018-08-11 | Disposition: A | Discharge status: Home / Self Care | Payer: Medicare Other | Source: Ambulatory Visit | Attending: Oncology | Admitting: Oncology

## 2018-08-11 ENCOUNTER — Other Ambulatory Visit (HOSPITAL_COMMUNITY)
Admission: RE | Admit: 2018-08-11 | Discharge: 2018-08-11 | Disposition: A | Discharge status: Home / Self Care | Payer: Medicare Other | Source: Ambulatory Visit | Attending: Oncology | Admitting: Oncology

## 2018-08-11 DIAGNOSIS — C9110 Chronic lymphocytic leukemia of B-cell type not having achieved remission: Secondary | ICD-10-CM | POA: Diagnosis present

## 2018-08-11 DIAGNOSIS — C8299 Follicular lymphoma, unspecified, extranodal and solid organ sites: Secondary | ICD-10-CM

## 2018-08-13 ENCOUNTER — Other Ambulatory Visit: Payer: Self-pay | Admitting: Oncology

## 2018-08-13 NOTE — Progress Notes (Signed)
ID: DACOTA DEVALL   DOB: 05/06/1951  MR#: [id number]  BOF#:[id number]  Patient Care Team: Marton Redwood, MD as PCP - General (Internal Medicine) [name], MD as Consulting Physician (Orthopedic Surgery) Sammye Staff, [name] Dad, MD as Consulting Physician (Oncology)   CHIEF COMPLAINT: Small lymphocytic lymphoma  CURRENT TREATMENT: Ibrutinib   HISTORY OF PRESENT ILLNESS: From the original intake note:  Maria [name] developed what she thought was "the flu" in December of 2002. She noted a large lymph node developing in her right anterior cervical area. 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   The patient continues on Ibrutinib, which she is tolerating well. She experiences some bruising. She does not experience nausea.    Since her last visit here, she underwent a skull base to thigh PET scan on 07/23/2018, showing extensive and progressive adenopathy identified within the neck, chest, abdomen and pelvis. There is persistent Deauville criteria 4 uptake associated with cervical, abdominal, and pelvic adenopathy compatible with metabolically active disease. There is aortic Atherosclerosis (ICD10-I70.0). There are coronary artery atherosclerotic calcifications.  She also underwent a digital diagnostic right mammogram with tomography and an ultrasound of the right axilla on 08/11/2018, showing: Breast Density Category B. There are multiple enlarged right axillary lymph nodes. There is no mammographic evidence of malignancy in the bilateral breasts.  In addition, she also underwent an ultrasound guided core needle biopsy of a right axillary node on 08/11/2018, showing ([id number]) small lymphocytic lymphoma with features concerning for disease progression.  The abnormal  cells were CD20 CD5 and CD23 positive, lambda restricted. She experienced bruising from this.   I have discussed the pathology report with our hematopathologist Dr. Gari Crown, and he is unable to make a definitive determination of whether we are dealing with transformation to a large cell B-cell diffuse non-Hodgkin's lymphoma versus disease progression of the chronic lymphoid leukemia.  More tissue would be helpful.   REVIEW OF SYSTEMS: Maria [name] is doing well overall. Yesterday afternoon (07/15/2018), she has developed watery diarrhea, with cramping. This woke her up several times during the night. She has not been on antibiotics for the last three months. She was also experiencing reflux symptoms, for which she drank water to help relieve. Today (08/15/2018), the diarrhea is still present, but it is not as active as the day before. The patient denies typical 'B' symptoms of fever, diaphoresis, weight loss, or fatigue. For exercise, she is attending a class three times a week that rotates between weights and cardio; she also walks. The patient denies unusual headaches, visual changes, nausea, vomiting, or dizziness. There has been no unusual cough, phlegm production, or pleurisy. This been no change in bladder habits. The patient denies bleeding or a rash. A detailed review of systems was otherwise noncontributory.    PAST MEDICAL HISTORY: Past Medical History:  Diagnosis Date  . Allergy   . Arthritis   . Asthma   . Cancer [facility])    chronic lymphacytic leukemia  . Diabetes mellitus   . Hyperlipidemia   . Hypertension   . Hypothyroidism   . Thyroid disease    hypothyroidism    PAST SURGICAL HISTORY: Past Surgical History:  Procedure Laterality Date  . ABDOMINAL HYSTERECTOMY    . BREAST EXCISIONAL BIOPSY Right   . LYMPH NODE DISSECTION    .  STRABISMUS SURGERY    . TONSILLECTOMY    . TOTAL KNEE ARTHROPLASTY Right 12/03/2016   Procedure: RIGHT TOTAL KNEE ARTHROPLASTY;  Surgeon: [name],  MD;  Location: [facility];  Service: Orthopedics;  Laterality: Right;    FAMILY HISTORY Family History  Problem Relation Age of Onset  . Liver cancer Mother   . Heart disease Father   . Breast cancer Sister 9  . Colon cancer Neg Hx   . Pancreatic cancer Neg Hx   . Rectal cancer Neg Hx   . Stomach cancer Neg Hx    The patient's father died at the age of 93 from a myocardial infarction. The patient's mother died at the age of 8 from primary liver carcinoma. The patient had no brothers. Her one sister, Maria [name], has a history of breast cancer  GYNECOLOGIC HISTORY: Menarche at around age 63. The patient is GX P0. 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 She lives by herself with her cats [place] and [place]. She attends a local [place]: Not in place  HEALTH MAINTENANCE: Social History   Tobacco Use  . Smoking status: Never Smoker  . Smokeless tobacco: Never Used  Substance Use Topics  . Alcohol use: Yes    Comment: occasional alcohol intake; once or twice monthly  . Drug use: No     Colonoscopy: 06/01/2014/[facility]  PAP:  Bone density:  Lipid panel:  228/114/50/155/ratio 4.6  On 10/01/2016  Allergies  Allergen Reactions  . Iohexol Swelling and Rash  . Cephalexin     REACTION: Rash  . Contrast Media  [Iodinated Diagnostic Agents]   . Lactose Intolerance (Gi)     Diarrhea, gas bloating  . Lactulose     Diarrhea, gas bloating    Current Outpatient Medications  Medication Sig Dispense Refill  . Ergocalciferol (VITAMIN D2 PO) Take 1,000 Units by mouth daily.    . hydrochlorothiazide (HYDRODIURIL) 12.5 MG tablet Take 12.5 mg by mouth every morning.    . Ibrutinib (IMBRUVICA) 420 MG TABS Take 1 tablet by mouth at bedtime. 28 tablet 2  . SYNTHROID 88 MCG tablet Take 88 mcg by  mouth daily before breakfast.      No current facility-administered medications for this visit.     OBJECTIVE: Middle-aged white woman in no acute distress  Vitals:   08/15/18 0935  BP: (!) 129/54  [name]: 71  Resp: 18  Temp: 98.6 F (37 C)  SpO2: 99%     Body mass index is 30.5 kg/m.    ECOG FS: 0  Sclerae unicteric, EOMs intact Oropharynx clear and moist Lungs no rales or rhonchi Heart regular rate and rhythm Abd soft, nontender, positive bowel sounds MSK no focal spinal tenderness, no upper extremity lymphedema Neuro: nonfocal, well oriented, appropriate affect Breasts: Deferred; there is palpable right axillary adenopathy, most prominently a little bit below the axilla at the mid axillary line     LAB RESULTS:  Lab Results  Component Value Date   WBC 11.7 (H) 08/15/2018   NEUTROABS 3.8 08/15/2018   HGB 14.2 08/15/2018   HCT 44.4 08/15/2018   MCV 88.4 08/15/2018   PLT 130 (L) 08/15/2018      Chemistry      Component Value Date/Time   NA 141 07/14/2018 1257   NA 139 08/05/2017 1353  K 4.0 07/14/2018 1257   K 3.7 08/05/2017 1353   CL 104 07/14/2018 1257   CO2 28 07/14/2018 1257   CO2 27 08/05/2017 1353   BUN 11 07/14/2018 1257   BUN 17.3 08/05/2017 1353   CREATININE 1.09 (H) 07/14/2018 1257   CREATININE 1.0 08/05/2017 1353   GLU 101 (H) 10/18/2009 1542      Component Value Date/Time   CALCIUM 9.3 07/14/2018 1257   CALCIUM 9.2 08/05/2017 1353   ALKPHOS 74 07/14/2018 1257   ALKPHOS 76 08/05/2017 1353   AST 15 07/14/2018 1257   AST 18 08/05/2017 1353   ALT 8 07/14/2018 1257   ALT 14 08/05/2017 1353   BILITOT 0.7 07/14/2018 1257   BILITOT 0.59 08/05/2017 1353       No results found for: LABCA2  No components found for: LABCA125  No results for input(s): INR in the last 168 hours.  Urinalysis    Component Value Date/Time   COLORURINE LT. YELLOW 01/24/2010 1017   APPEARANCEUR CLEAR 01/24/2010 1017   LABSPEC <=1.005 01/24/2010 1017    PHURINE 5.5 01/24/2010 1017   GLUCOSEU NEGATIVE 01/24/2010 [place] 01/24/2010 1017   KETONESUR NEGATIVE 01/24/2010 1017   UROBILINOGEN 0.2 01/24/2010 1017   NITRITE NEGATIVE 01/24/2010 1017   LEUKOCYTESUR NEGATIVE 01/24/2010 1017    STUDIES: Nm Pet Image Restag (ps) Skull Base To Thigh  Result Date: 07/24/2018 CLINICAL DATA:  Subsequent treatment strategy for lymphoma EXAM: NUCLEAR MEDICINE PET SKULL BASE TO THIGH TECHNIQUE: 9.95 mCi F-18 FDG was injected intravenously. Full-ring PET imaging was performed from the skull base to thigh after the radiotracer. CT data was obtained and used for attenuation correction and anatomic localization. Fasting blood glucose: 82 mg/dl COMPARISON:  08/21/2016 FINDINGS: Mediastinal blood pool activity: SUV max 2.55 Liver activity: SUV max 3.23 NECK: Bilateral cervical adenopathy is identified within the anterior and posterior nodal chains. Index left level 2 lymph node measures 1.2 cm and has an SUV max of 3.48, Deauville criteria 4. Multiple prominent bilateral posterior triangle lymph nodes are identified. These measure up to 1 cm. SUV max within the right posterior triangle cervical lymph nodes is equal to 2.4, Deauville criteria 2. SUV max within the left posterior cervical triangle nodes is equal to 2.47, Deauville criteria 2. Incidental CT findings: none CHEST: Multiple prominent right axillary lymph nodes are again noted up to 1.4 cm short axis with SUV max of 2.8, Deauville criteria 3. On the previous exam the SUV max within the right axilla was equal to 2.4. On the left prominent lymph nodes measure up to 1.3 cm with SUV max of 3.04, Deauville criteria 3. 8 mm short axis right paratracheal lymph node has an SUV max of 2.92. Deauville criteria 3. No hilar adenopathy. No pleural effusion. Tiny right middle lobe lung nodule measures 3 mm, image 98/4. Incidental CT findings: Aortic atherosclerosis. Calcification within the LAD, left circumflex  and RCA coronary arteries noted. ABDOMEN/PELVIS: No abnormal radiotracer uptake identified within the liver, pancreas, or adrenal glands. The SUV max within the spleen is equal to 3.16. Bulky adenopathy within the abdominal retroperitoneum and mesentery identified. Large nodal mass within the central abdomen measures 16.5 x 9.3 cm with SUV max of 3.87, Deauville criteria 4. Previously this measured 6.2 x 10.4 cm with SUV max of 4.3. Enlarged porta hepatic nodes measure up to 3.6 cm and have an SUV max of 3.92, Deauville criteria 4. Bilateral pelvic adenopathy identified. Right pelvic sidewall node measures 2.1  cm with SUV max of 3.4. Deauville criteria 4. Previously this lymph node measured 1.5 cm with SUV max of 4.3. Left pelvic sidewall lymph node measures 2.1 cm within SUV max of 3.02, Deauville criteria 3. Previously this measured 1.6 cm with SUV max of 3.75. Incidental CT findings: none SKELETON: No focal hypermetabolic activity to suggest skeletal metastasis. Incidental CT findings: none IMPRESSION: 1. Extensive and progressive adenopathy identified within the neck, chest, abdomen and pelvis. There is persistent Deauville criteria 4 uptake associated with cervical, abdominal, and pelvic adenopathy compatible with metabolically active disease. 2.  Aortic Atherosclerosis (ICD10-I70.0). 3. Coronary artery atherosclerotic calcifications. Electronically Signed   By: Kerby Moors M.D.   On: 07/24/2018 09:58   Mm Diag Breast Tomo Bilateral  Result Date: 08/11/2018 CLINICAL DATA:  67 year old female with history of CLL presenting for evaluation of enlarged right axillary lymph nodes identified on a recent PET exam. EXAM: DIGITAL DIAGNOSTIC RIGHT MAMMOGRAM WITH CAD AND TOMO ULTRASOUND RIGHT AXILLA COMPARISON:  Previous exam(s). ACR Breast Density Category b: There are scattered areas of fibroglandular density. FINDINGS: No suspicious calcifications, masses or areas of distortion are seen in the bilateral breasts.  There are multiple enlarged lymph nodes seen in the right axilla. Mammographic images were processed with CAD. Ultrasound of the right axilla demonstrates numerous abnormally thickened lymph nodes. The cortex of 1 of the thickest lymph nodes measures up to 1 cm. IMPRESSION: 1.  There are multiple enlarged right axillary lymph nodes. 2.  No mammographic evidence of malignancy in the bilateral breasts. RECOMMENDATION: Ultrasound guided biopsy is recommended for 1 of the right axillary lymph nodes. This procedure is scheduled for later today and will be dictated in a separate report. I have discussed the findings and recommendations with the patient. Results were also provided in writing at the conclusion of the visit. If applicable, a reminder letter will be sent to the patient regarding the next appointment. BI-RADS CATEGORY  4: Suspicious. Electronically Signed   By: Ammie Ferrier M.D.   On: 08/11/2018 13:56   [place] Axillary Node Core Biopsy Right  Result Date: 08/11/2018 CLINICAL DATA:  67 year old with long history of non-Hodgkin's lymphoma small lymphocytic type/CLL, with recent PET-CT demonstrating progressively enlarging metabolically active lymphadenopathy throughout the neck, chest, abdomen and pelvis. Diagnostic mammography earlier today demonstrated no mammographic evidence of malignancy. EXAM: ULTRASOUND GUIDED CORE NEEDLE BIOPSY OF A RIGHT AXILLARY NODE COMPARISON:  Previous exam(s). FINDINGS: I met with the patient and we discussed the procedure of ultrasound-guided biopsy, including benefits and alternatives. We discussed the high likelihood of a successful procedure. We discussed the risks of the procedure, including infection, bleeding, tissue injury, clip migration, and inadequate sampling. Informed written consent was given. The usual time-out protocol was performed immediately prior to the procedure. Using sterile technique with chlorhexidine as skin antisepsis, 1% lidocaine and 1% lidocaine  with epinephrine as local anesthetic, under direct ultrasound visualization, a 14 gauge Bard Marquee core needle device placed through a 13 gauge introducer needle was used to perform biopsy of a pathologic RIGHT axillary lymph node using an inferolateral approach. Three core samples were placed in formalin and 4 core samples were placed in saline. At the conclusion of the procedure a HydroMark spiral shaped tissue marker clip was deployed into the biopsy cavity. IMPRESSION: Ultrasound guided biopsy of a pathologic RIGHT axillary lymph node. No apparent complications. Electronically Signed   By: Evangeline Dakin M.D.   On: 08/11/2018 14:40   [place] Axilla Right  Result Date: 08/11/2018  CLINICAL DATA:  67 year old female with history of CLL presenting for evaluation of enlarged right axillary lymph nodes identified on a recent PET exam. EXAM: DIGITAL DIAGNOSTIC RIGHT MAMMOGRAM WITH CAD AND TOMO ULTRASOUND RIGHT AXILLA COMPARISON:  Previous exam(s). ACR Breast Density Category b: There are scattered areas of fibroglandular density. FINDINGS: No suspicious calcifications, masses or areas of distortion are seen in the bilateral breasts. There are multiple enlarged lymph nodes seen in the right axilla. Mammographic images were processed with CAD. Ultrasound of the right axilla demonstrates numerous abnormally thickened lymph nodes. The cortex of 1 of the thickest lymph nodes measures up to 1 cm. IMPRESSION: 1.  There are multiple enlarged right axillary lymph nodes. 2.  No mammographic evidence of malignancy in the bilateral breasts. RECOMMENDATION: Ultrasound guided biopsy is recommended for 1 of the right axillary lymph nodes. This procedure is scheduled for later today and will be dictated in a separate report. I have discussed the findings and recommendations with the patient. Results were also provided in writing at the conclusion of the visit. If applicable, a reminder letter will be sent to the patient regarding  the next appointment. BI-RADS CATEGORY  4: Suspicious. Electronically Signed   By: Ammie Ferrier M.D.   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 There was no CD10 or cyclin D1 positivity identified  (4) started ibrutinib at 420 mg/ day 08/09/2014  (a) PET scan 07/23/2018 shows progressive adenopathy diffusely  (b) right axillary lymph node core biopsy shows features concerning but not definitive for transformation  PLAN:  Britnee is now 4 years out from starting ibrutinib.  She has done very well with this medication but her lymphoma does appear to be progressing on it.  The question is whether it is only progressing or whether is also transforming.  Unfortunately the multiple core biopsy samples obtained by radiology are not able to give [place] a definitive answer.  I am accordingly setting her up for lymph node excision within the next week or so.  If we only have progression without transformation then I would simply add rituximab.  If there is transformation of course we will either go with bendamustine rituximab or CHOP rituximab.  Either way she will have rituximab.  I have asked her to stop the ibrutinib now which will cut back on bleeding complications from her surgery.  She will have a low dose of rituximab 08/27/2018 since she had a terrific reaction originally and we want her go very slowly.  She will have a fuller dose on 1219.  She will see me on 1219 (I can see her in the infusion area) to discuss biopsy results.  Jerika has a good  understanding of this plan and agrees with it.  She knows to call for any other issues that may develop before the next visit.  Luster Hechler, [name] Dad, MD  08/15/18 2:56 PM Medical Oncology and Hematology [facility] [address] [employer] [employer], [place] [address] Tel. [phone]    Fax. [phone]    I, Jacqualyn Posey am acting as a Education administrator for Chauncey Cruel, MD.  I, Lurline Del MD, have reviewed the above documentation for accuracy and completeness, and I agree with the above.

## 2018-08-15 ENCOUNTER — Telehealth: Payer: Self-pay | Admitting: Oncology

## 2018-08-15 ENCOUNTER — Inpatient Hospital Stay: Payer: Medicare Other | Attending: Oncology

## 2018-08-15 ENCOUNTER — Inpatient Hospital Stay (HOSPITAL_BASED_OUTPATIENT_CLINIC_OR_DEPARTMENT_OTHER): Payer: Medicare Other | Admitting: Oncology

## 2018-08-15 VITALS — BP 129/54 | HR 71 | Temp 98.6°F | Resp 18 | Ht 66.75 in | Wt 193.3 lb

## 2018-08-15 DIAGNOSIS — C9110 Chronic lymphocytic leukemia of B-cell type not having achieved remission: Secondary | ICD-10-CM

## 2018-08-15 DIAGNOSIS — I700 Atherosclerosis of aorta: Secondary | ICD-10-CM | POA: Diagnosis not present

## 2018-08-15 DIAGNOSIS — Z5112 Encounter for antineoplastic immunotherapy: Secondary | ICD-10-CM | POA: Insufficient documentation

## 2018-08-15 DIAGNOSIS — R197 Diarrhea, unspecified: Secondary | ICD-10-CM | POA: Diagnosis not present

## 2018-08-15 LAB — CBC WITH DIFFERENTIAL/PLATELET
ABS IMMATURE GRANULOCYTES: 0.07 10*3/uL (ref 0.00–0.07)
Basophils Absolute: 0.1 10*3/uL (ref 0.0–0.1)
Basophils Relative: 0 %
EOS PCT: 1 %
Eosinophils Absolute: 0.1 10*3/uL (ref 0.0–0.5)
HCT: 44.4 % (ref 36.0–46.0)
Hemoglobin: 14.2 g/dL (ref 12.0–15.0)
Immature Granulocytes: 1 %
Lymphocytes Relative: 53 %
Lymphs Abs: 6.3 10*3/uL — ABNORMAL HIGH (ref 0.7–4.0)
MCH: 28.3 pg (ref 26.0–34.0)
MCHC: 32 g/dL (ref 30.0–36.0)
MCV: 88.4 fL (ref 80.0–100.0)
Monocytes Absolute: 1.4 10*3/uL — ABNORMAL HIGH (ref 0.1–1.0)
Monocytes Relative: 12 %
NRBC: 0 % (ref 0.0–0.2)
Neutro Abs: 3.8 10*3/uL (ref 1.7–7.7)
Neutrophils Relative %: 33 %
PLATELETS: 130 10*3/uL — AB (ref 150–400)
RBC: 5.02 MIL/uL (ref 3.87–5.11)
RDW: 15.2 % (ref 11.5–15.5)
WBC: 11.7 10*3/uL — ABNORMAL HIGH (ref 4.0–10.5)

## 2018-08-15 LAB — LACTATE DEHYDROGENASE: LDH: 231 U/L — ABNORMAL HIGH (ref 98–192)

## 2018-08-15 NOTE — Telephone Encounter (Signed)
Per MD see in infusion of 12/19 at 9am

## 2018-08-15 NOTE — Telephone Encounter (Signed)
Gave avs and calendar ° °

## 2018-08-16 LAB — BETA 2 MICROGLOBULIN, SERUM: Beta-2 Microglobulin: 4.9 mg/L — ABNORMAL HIGH (ref 0.6–2.4)

## 2018-08-18 ENCOUNTER — Other Ambulatory Visit: Payer: Self-pay | Admitting: General Surgery

## 2018-08-18 NOTE — H&P (Signed)
Maria [name] Location: [facility] Patient #: [id number] DOB: 1950-09-20 Single / Language: Cleophus Molt / Race: White Female       History of Present Illness  The patient is a 67 year old female who presents with a complaint of lymphoma and lymphadenopathy. She is  referred urgently by Dr. Jana Hakim to see if we can get a lymph node biopsy done this week to clarify her diagnosis. Carmie Kanner is her PCP.      In 2002 she had an enlarging lymph node in her neck which was biopsied and showed chronic lymphoid leukemia. She was treated at [facility] in 2002 by  Dr. Alroy Dust. She relapsed and was treated in 2008 at [facility]. She is followed by Dr. Jana Hakim and continues on Ibrutinib.      PET scan on July 23, 2018 showed extensive adenopathy in the neck, chest, abdomen and pelvis. Diagnostic right mammogram with tomography and ultrasound of the right axilla on 08/11/18 showed normal breast but enlarged right axillary lymph nodes. Radiology recommended core biopsy. 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 Discussion with pathology revealed there unable to make definitive determination and requested more tissue. I have agreed to see her and proceed with axillary lymph node biopsy.      She is otherwise feeling fine and pretty healthy. Comorbidities include the leukemia. Borderline diabetes. Borderline asthma. Hypertension. Hypothyroidism. Hyperlipidemia. Abdominal hysterectomy without BSO. Right total knee replacement. Based on family history reveals no blood dyscrasias Social history reveals she is single. No children. Lives alone in [place]. Denies tobacco. Has plenty of adult friends that can help her with transportation and overnight stay postop      Exam reveals a 2 cm palpable lymph nodes in both axillae. Most likely we will be able to get more than adequate tissue sampling this way. She does not care what side  we excise. We will decide that the morning of surgery after I examined her. She'll be scheduled for excision deep axillary lymph node biopsy. Possibly right. Possibly left. I discussed the indications, details, techniques, and risk of the surgery with her in detail. She is aware the risk of bleeding, infection, nerve damage, sensory deficit, arm swelling, shoulder disability. She understands all these issues. All of her questions are answered. She agrees with this plan.    Past Surgical History  Hysterectomy (not due to cancer) - Partial  Knee Surgery  Right. Oral Surgery  Shoulder Surgery  Right. Tonsillectomy   Diagnostic Studies History Colonoscopy  1-5 years ago Mammogram  within last year Pap Smear  >5 years ago  Allergies  Keflex *CEPHALOSPORINS*  Rash. Yellow Dyes  Lactose *PHARMACEUTICAL ADJUVANTS*  Allergies Reconciled   Medication History  Synthroid (88MCG Tablet, Oral) Active. hydroCHLOROthiazide (12.5MG  Tablet, Oral) Active. Imbruvica (420MG  Tablet, Oral) Active. Rosuvastatin Calcium (10MG  Tablet, Oral) Active. PreviDent 5000 Booster Plus (1.1% Paste, Dental) Active. Vitamin B12 (500MCG Tablet, Oral) Active. Medications Reconciled  Social History Alcohol use  Occasional alcohol use. Caffeine use  Coffee, Tea. No drug use  Tobacco use  Never smoker.  Family History  Breast Cancer  Sister. Cancer  Mother. Depression  Mother. Heart Disease  Father. Heart disease in female family member before age 21  Hypertension  Mother.  Pregnancy / Birth History Age at menarche  36 years. Gravida  0  Other Problems  Arthritis  Cancer  High blood pressure  Hypercholesterolemia  Thyroid Disease     Review of Systems  General  Not Present- Appetite Loss, Chills, Fatigue, Fever, Night Sweats, Weight Gain and Weight Loss. Skin Present- Dryness. Not Present- Change in Wart/Mole, Hives, Jaundice, New Lesions, Non-Healing  Wounds, Rash and Ulcer. HEENT Present- Seasonal Allergies and Wears glasses/contact lenses. Not Present- Earache, Hearing Loss, Hoarseness, Nose Bleed, Oral Ulcers, Ringing in the Ears, Sinus Pain, Sore Throat, Visual Disturbances and Yellow Eyes. Respiratory Not Present- Bloody sputum, Chronic Cough, Difficulty Breathing, Snoring and Wheezing. Breast Not Present- Breast Mass, Breast Pain, Nipple Discharge and Skin Changes. Cardiovascular Present- Leg Cramps and Swelling of Extremities. Not Present- Chest Pain, Difficulty Breathing Lying Down, Palpitations, Rapid Heart Rate and Shortness of Breath. Gastrointestinal Not Present- Abdominal Pain, Bloating, Bloody Stool, Change in Bowel Habits, Chronic diarrhea, Constipation, Difficulty Swallowing, Excessive gas, Gets full quickly at meals, Hemorrhoids, Indigestion, Nausea, Rectal Pain and Vomiting. Female Genitourinary Present- Nocturia. Not Present- Frequency, Painful Urination, Pelvic Pain and Urgency. Musculoskeletal Present- Joint Pain, Joint Stiffness and Swelling of Extremities. Not Present- Back Pain, Muscle Pain and Muscle Weakness. Neurological Not Present- Decreased Memory, Fainting, Headaches, Numbness, Seizures, Tingling, Tremor, Trouble walking and Weakness. Psychiatric Not Present- Anxiety, Bipolar, Change in Sleep Pattern, Depression, Fearful and Frequent crying. Endocrine Not Present- Cold Intolerance, Excessive Hunger, Hair Changes, Heat Intolerance, Hot flashes and New Diabetes. Hematology Present- Easy Bruising. Not Present- Blood Thinners, Excessive bleeding, Gland problems, HIV and Persistent Infections.  Vitals  Weight: 193.8 lb Height: 66in Body Surface Area: 1.97 m Body Mass Index: 31.28 kg/m  Temp.: 97.56F(Temporal)  Pulse: 76 (Regular)  P.OX: 97% (Room air) BP: 142/64 (Sitting, Left Arm, Standard)       Physical Exam  General Mental Status-Alert. General Appearance-Consistent with stated  age. Hydration-Well hydrated. Voice-Normal.  Head and Neck Head-normocephalic, atraumatic with no lesions or palpable masses. Trachea-midline. Thyroid Gland Characteristics - normal size and consistency.  Eye Eyeball - Bilateral-Extraocular movements intact. Sclera/Conjunctiva - Bilateral-No scleral icterus.  Chest and Lung Exam Chest and lung exam reveals -quiet, even and easy respiratory effort with no use of accessory muscles and on auscultation, normal breath sounds, no adventitious sounds and normal vocal resonance. Inspection Chest Wall - Normal. Back - normal.  Breast Breast - Left-Symmetric, Non Tender, No Biopsy scars, no Dimpling, No Inflammation, No Lumpectomy scars, No Mastectomy scars, No Peau d' Orange. Breast - Right-Symmetric, Non Tender, No Biopsy scars, no Dimpling, No Inflammation, No Lumpectomy scars, No Mastectomy scars, No Peau d' Orange. Breast Lump-No Palpable Breast Mass.  Cardiovascular Cardiovascular examination reveals -normal heart sounds, regular rate and rhythm with no murmurs and normal pedal pulses bilaterally.  Abdomen Inspection Inspection of the abdomen reveals - No Hernias. Skin - Scar - Note: Hysterectomy scar well-healed. Palpation/Percussion Palpation and Percussion of the abdomen reveal - Soft, Non Tender, No Rebound tenderness, No Rigidity (guarding) and No hepatosplenomegaly. Auscultation Auscultation of the abdomen reveals - Bowel sounds normal.  Neurologic Neurologic evaluation reveals -alert and oriented x 3 with no impairment of recent or remote memory. Mental Status-Normal.  Musculoskeletal Normal Exam - Left-Upper Extremity Strength Normal and Lower Extremity Strength Normal. Normal Exam - Right-Upper Extremity Strength Normal and Lower Extremity Strength Normal. Note: Right knee scar   Lymphatic Note: Bilateral submandibular nodes about 2 cm in size. Not very mobile. Perhaps a submental  node.   Bilateral axillary lymph nodes palpable, 2 cm, probably 2 or 3 in each axilla. A little bit tender. Inguinal areas not examined     Assessment & Plan  LYMPHADENOPATHY, AXILLARY (R59.0)  You have a long history  of chronic lymphocytic leukemia You have done well with your treatments  Recent scans show enlarged lymph nodes in the neck, axilla, and abdomen Mammogram show the breasts to be normal Image guided core needle biopsy suggested lymphoma but they need more tissue to be sure  you will be scheduled for excisional biopsy of deep axillary lymph node this coming Thursday I have discussed the indications, techniques, and risk of the surgery in detail with the   [place] (C91.10)    Originally diagnosed 2002. Treated at [facility]. BORDERLINE DIABETES (R73.03) BMI 31.0-31.9,ADULT (Z68.31) HYPERTENSION, BENIGN (I10) HYPOTHYROIDISM, ADULT (E03.9) HISTORY OF ABDOMINAL HYSTERECTOMY (Z90.710) HISTORY OF KNEE REPLACEMENT, TOTAL, RIGHT (G76.184)   Edsel Petrin. Dalbert Batman, M.D., [facility] General and Minimally invasive Surgery Breast and Colorectal Surgery Office:   [phone] Pager:   [phone]

## 2018-08-20 ENCOUNTER — Encounter (HOSPITAL_COMMUNITY): Payer: Self-pay | Admitting: *Deleted

## 2018-08-20 ENCOUNTER — Other Ambulatory Visit: Payer: Self-pay

## 2018-08-20 NOTE — Progress Notes (Signed)
Pt denies SOB, chest pain, and being under the care of a cardiologist. Pt denies having a stress test, echo and cardiac cath. Pt denies having a chest x ray and EKG within the last year. Pt stated that she does not take Aspirin and stopped taking Ibrutinib (IMBRUVICA) " it has an Aspirin like component."  Pt made aware to stop taking vitamins, fish oil and herbal medications. Do not take any NSAIDs ie: Ibuprofen, Advil, Naproxen (Aleve), Motrin, BC and Goody Powder. Pt verbalized understanding of all pre-op instructions.

## 2018-08-21 ENCOUNTER — Ambulatory Visit (HOSPITAL_COMMUNITY): Payer: Medicare Other | Admitting: Certified Registered Nurse Anesthetist

## 2018-08-21 ENCOUNTER — Encounter (HOSPITAL_COMMUNITY)
Admission: RE | Disposition: A | Discharge status: Home / Self Care | Payer: Self-pay | Source: Home / Self Care | Attending: General Surgery

## 2018-08-21 ENCOUNTER — Encounter (HOSPITAL_COMMUNITY): Payer: Self-pay | Admitting: *Deleted

## 2018-08-21 ENCOUNTER — Ambulatory Visit (HOSPITAL_COMMUNITY)
Admission: RE | Admit: 2018-08-21 | Discharge: 2018-08-21 | Disposition: A | Discharge status: Home / Self Care | Payer: Medicare Other | Attending: General Surgery | Admitting: General Surgery

## 2018-08-21 DIAGNOSIS — C9110 Chronic lymphocytic leukemia of B-cell type not having achieved remission: Secondary | ICD-10-CM | POA: Diagnosis present

## 2018-08-21 DIAGNOSIS — C8590 Non-Hodgkin lymphoma, unspecified, unspecified site: Secondary | ICD-10-CM | POA: Diagnosis not present

## 2018-08-21 DIAGNOSIS — I10 Essential (primary) hypertension: Secondary | ICD-10-CM | POA: Diagnosis not present

## 2018-08-21 DIAGNOSIS — Z881 Allergy status to other antibiotic agents status: Secondary | ICD-10-CM | POA: Insufficient documentation

## 2018-08-21 DIAGNOSIS — E039 Hypothyroidism, unspecified: Secondary | ICD-10-CM | POA: Insufficient documentation

## 2018-08-21 DIAGNOSIS — R7303 Prediabetes: Secondary | ICD-10-CM | POA: Diagnosis not present

## 2018-08-21 DIAGNOSIS — E7800 Pure hypercholesterolemia, unspecified: Secondary | ICD-10-CM | POA: Diagnosis not present

## 2018-08-21 DIAGNOSIS — M1990 Unspecified osteoarthritis, unspecified site: Secondary | ICD-10-CM | POA: Diagnosis not present

## 2018-08-21 DIAGNOSIS — Z79899 Other long term (current) drug therapy: Secondary | ICD-10-CM | POA: Diagnosis not present

## 2018-08-21 DIAGNOSIS — J45909 Unspecified asthma, uncomplicated: Secondary | ICD-10-CM | POA: Diagnosis not present

## 2018-08-21 HISTORY — DX: Headache: R51

## 2018-08-21 HISTORY — DX: Presence of spectacles and contact lenses: Z97.3

## 2018-08-21 HISTORY — DX: Chronic lymphocytic leukemia of B-cell type not having achieved remission: C91.10

## 2018-08-21 HISTORY — PX: AXILLARY LYMPH NODE BIOPSY: SHX5737

## 2018-08-21 HISTORY — DX: Non-Hodgkin lymphoma, unspecified, unspecified site: C85.90

## 2018-08-21 HISTORY — DX: Headache, unspecified: R51.9

## 2018-08-21 LAB — BASIC METABOLIC PANEL
Anion gap: 13 (ref 5–15)
BUN: 9 mg/dL (ref 8–23)
CO2: 21 mmol/L — ABNORMAL LOW (ref 22–32)
Calcium: 9.1 mg/dL (ref 8.9–10.3)
Chloride: 102 mmol/L (ref 98–111)
Creatinine, Ser: 1.05 mg/dL — ABNORMAL HIGH (ref 0.44–1.00)
GFR calc Af Amer: >60 mL/min (ref >60)
GFR calc non Af Amer: 55 mL/min — ABNORMAL LOW (ref >60)
Glucose, Bld: 81 mg/dL (ref 70–99)
POTASSIUM: 3.1 mmol/L — AB (ref 3.5–5.1)
Sodium: 136 mmol/L (ref 135–145)

## 2018-08-21 LAB — GLUCOSE, CAPILLARY
GLUCOSE-CAPILLARY: 68 mg/dL — AB (ref 70–99)
Glucose-Capillary: 73 mg/dL (ref 70–99)
Glucose-Capillary: 109 mg/dL — ABNORMAL HIGH (ref 70–99)
Glucose-Capillary: 88 mg/dL (ref 70–99)

## 2018-08-21 SURGERY — AXILLARY LYMPH NODE BIOPSY
Anesthesia: General | Site: Breast | Laterality: Right

## 2018-08-21 MED ORDER — OXYCODONE HCL 5 MG PO TABS: 5.0000 mg | ORAL_TABLET | Freq: Once | ORAL | Status: DC | PRN

## 2018-08-21 MED ORDER — MIDAZOLAM HCL 2 MG/2ML IJ SOLN
INTRAMUSCULAR | Status: DC | PRN
  Administered 2018-08-21: 2 mg via INTRAVENOUS

## 2018-08-21 MED ORDER — MIDAZOLAM HCL 2 MG/2ML IJ SOLN
INTRAMUSCULAR | Status: AC
  Filled 2018-08-21: qty 2

## 2018-08-21 MED ORDER — VANCOMYCIN HCL IN DEXTROSE 1-5 GM/200ML-% IV SOLN
1000.0000 mg | Freq: Once | INTRAVENOUS | Status: AC
  Administered 2018-08-21: 1000 mg via INTRAVENOUS

## 2018-08-21 MED ORDER — ONDANSETRON HCL 4 MG/2ML IJ SOLN
INTRAMUSCULAR | Status: DC | PRN
  Administered 2018-08-21: 4 mg via INTRAVENOUS

## 2018-08-21 MED ORDER — DEXTROSE 50 % IV SOLN
25.0000 mL | Freq: Once | INTRAVENOUS | Status: AC
  Administered 2018-08-21: 25 mL via INTRAVENOUS
  Filled 2018-08-21: qty 50

## 2018-08-21 MED ORDER — BUPIVACAINE-EPINEPHRINE (PF) 0.25% -1:200000 IJ SOLN
INTRAMUSCULAR | Status: DC | PRN
  Administered 2018-08-21: 4 mL via PERINEURAL

## 2018-08-21 MED ORDER — ACETAMINOPHEN 160 MG/5ML PO SOLN: 1000.0000 mg | Freq: Once | ORAL | Status: DC | PRN

## 2018-08-21 MED ORDER — SODIUM CHLORIDE 0.9 % IV SOLN: 250.0000 mL | INTRAVENOUS | Status: DC | PRN

## 2018-08-21 MED ORDER — ACETAMINOPHEN 10 MG/ML IV SOLN: 1000.0000 mg | Freq: Once | INTRAVENOUS | Status: DC | PRN

## 2018-08-21 MED ORDER — GABAPENTIN 300 MG PO CAPS
300.0000 mg | ORAL_CAPSULE | ORAL | Status: AC
  Administered 2018-08-21: 300 mg via ORAL

## 2018-08-21 MED ORDER — PHENYLEPHRINE HCL 10 MG/ML IJ SOLN
INTRAMUSCULAR | Status: DC | PRN
  Administered 2018-08-21: 80 ug via INTRAVENOUS
  Administered 2018-08-21: 120 ug via INTRAVENOUS

## 2018-08-21 MED ORDER — DIPHENHYDRAMINE HCL 50 MG/ML IJ SOLN
INTRAMUSCULAR | Status: DC | PRN
  Administered 2018-08-21: 12.5 mg via INTRAVENOUS

## 2018-08-21 MED ORDER — FENTANYL CITRATE (PF) 250 MCG/5ML IJ SOLN
INTRAMUSCULAR | Status: AC
  Filled 2018-08-21: qty 5

## 2018-08-21 MED ORDER — CHLORHEXIDINE GLUCONATE CLOTH 2 % EX PADS: 6.0000 | MEDICATED_PAD | Freq: Once | CUTANEOUS | Status: DC

## 2018-08-21 MED ORDER — ACETAMINOPHEN 500 MG PO TABS
ORAL_TABLET | ORAL | Status: AC
  Administered 2018-08-21: 1000 mg via ORAL
  Filled 2018-08-21: qty 2

## 2018-08-21 MED ORDER — OXYCODONE HCL 5 MG PO TABS: 5.0000 mg | ORAL_TABLET | ORAL | Status: DC | PRN

## 2018-08-21 MED ORDER — LACTATED RINGERS IV SOLN
INTRAVENOUS | Status: DC
  Administered 2018-08-21 (×2): via INTRAVENOUS

## 2018-08-21 MED ORDER — GABAPENTIN 300 MG PO CAPS
ORAL_CAPSULE | ORAL | Status: AC
  Administered 2018-08-21: 300 mg via ORAL
  Filled 2018-08-21: qty 1

## 2018-08-21 MED ORDER — EPHEDRINE SULFATE 50 MG/ML IJ SOLN
INTRAMUSCULAR | Status: DC | PRN
  Administered 2018-08-21: 5 mg via INTRAVENOUS

## 2018-08-21 MED ORDER — LACTATED RINGERS IV SOLN: INTRAVENOUS | Status: DC

## 2018-08-21 MED ORDER — 0.9 % SODIUM CHLORIDE (POUR BTL) OPTIME
TOPICAL | Status: DC | PRN
  Administered 2018-08-21: 1000 mL

## 2018-08-21 MED ORDER — GLYCOPYRROLATE PF 0.2 MG/ML IJ SOSY
PREFILLED_SYRINGE | INTRAMUSCULAR | Status: AC
  Filled 2018-08-21: qty 1

## 2018-08-21 MED ORDER — DIPHENHYDRAMINE HCL 50 MG/ML IJ SOLN
INTRAMUSCULAR | Status: AC
  Filled 2018-08-21: qty 1

## 2018-08-21 MED ORDER — VANCOMYCIN HCL IN DEXTROSE 1-5 GM/200ML-% IV SOLN
INTRAVENOUS | Status: AC
  Filled 2018-08-21: qty 200

## 2018-08-21 MED ORDER — OXYCODONE HCL 5 MG/5ML PO SOLN: 5.0000 mg | Freq: Once | ORAL | Status: DC | PRN

## 2018-08-21 MED ORDER — ACETAMINOPHEN 500 MG PO TABS
1000.0000 mg | ORAL_TABLET | ORAL | Status: AC
  Administered 2018-08-21: 1000 mg via ORAL

## 2018-08-21 MED ORDER — DEXAMETHASONE SODIUM PHOSPHATE 10 MG/ML IJ SOLN
INTRAMUSCULAR | Status: AC
  Filled 2018-08-21: qty 1

## 2018-08-21 MED ORDER — ACETAMINOPHEN 500 MG PO TABS: 1000.0000 mg | ORAL_TABLET | Freq: Four times a day (QID) | ORAL | Status: DC

## 2018-08-21 MED ORDER — ONDANSETRON HCL 4 MG/2ML IJ SOLN
INTRAMUSCULAR | Status: AC
  Filled 2018-08-21: qty 2

## 2018-08-21 MED ORDER — ACETAMINOPHEN 500 MG PO TABS: 1000.0000 mg | ORAL_TABLET | Freq: Once | ORAL | Status: DC | PRN

## 2018-08-21 MED ORDER — PROPOFOL 10 MG/ML IV BOLUS
INTRAVENOUS | Status: DC | PRN
  Administered 2018-08-21: 200 mg via INTRAVENOUS

## 2018-08-21 MED ORDER — GLYCOPYRROLATE 0.2 MG/ML IJ SOLN
INTRAMUSCULAR | Status: DC | PRN
  Administered 2018-08-21 (×2): 0.1 mg via INTRAVENOUS

## 2018-08-21 MED ORDER — DEXTROSE 50 % IV SOLN
INTRAVENOUS | Status: AC
  Filled 2018-08-21: qty 50

## 2018-08-21 MED ORDER — SODIUM CHLORIDE 0.9% FLUSH: 3.0000 mL | Freq: Two times a day (BID) | INTRAVENOUS | Status: DC

## 2018-08-21 MED ORDER — LIDOCAINE 2% (20 MG/ML) 5 ML SYRINGE
INTRAMUSCULAR | Status: AC
  Filled 2018-08-21: qty 5

## 2018-08-21 MED ORDER — FENTANYL CITRATE (PF) 250 MCG/5ML IJ SOLN
INTRAMUSCULAR | Status: DC | PRN
  Administered 2018-08-21 (×4): 25 ug via INTRAVENOUS

## 2018-08-21 MED ORDER — LIDOCAINE HCL (CARDIAC) PF 100 MG/5ML IV SOSY
PREFILLED_SYRINGE | INTRAVENOUS | Status: DC | PRN
  Administered 2018-08-21: 100 mg via INTRATRACHEAL

## 2018-08-21 MED ORDER — FENTANYL CITRATE (PF) 100 MCG/2ML IJ SOLN: 25.0000 ug | INTRAMUSCULAR | Status: DC | PRN

## 2018-08-21 MED ORDER — SODIUM CHLORIDE 0.9% FLUSH: 3.0000 mL | INTRAVENOUS | Status: DC | PRN

## 2018-08-21 MED ORDER — DEXAMETHASONE SODIUM PHOSPHATE 10 MG/ML IJ SOLN
INTRAMUSCULAR | Status: DC | PRN
  Administered 2018-08-21: 10 mg via INTRAVENOUS

## 2018-08-21 MED ORDER — HYDROCODONE-ACETAMINOPHEN 5-325 MG PO TABS: 1.0000 | ORAL_TABLET | Freq: Four times a day (QID) | ORAL | 0 refills | Status: DC | PRN

## 2018-08-21 SURGICAL SUPPLY — 43 items
APL SKNCLS STERI-STRIP NONHPOA (GAUZE/BANDAGES/DRESSINGS)
BENZOIN TINCTURE PRP APPL 2/3 (GAUZE/BANDAGES/DRESSINGS) IMPLANT
CANISTER SUCT 3000ML PPV (MISCELLANEOUS) IMPLANT
CHLORAPREP W/TINT 10.5 ML (MISCELLANEOUS) ×3 IMPLANT
CLOSURE WOUND 1/4X4 (GAUZE/BANDAGES/DRESSINGS)
COVER SURGICAL LIGHT HANDLE (MISCELLANEOUS) ×3 IMPLANT
COVER WAND RF STERILE (DRAPES) IMPLANT
DECANTER SPIKE VIAL GLASS SM (MISCELLANEOUS) ×3 IMPLANT
DERMABOND ADVANCED (GAUZE/BANDAGES/DRESSINGS) ×2
DERMABOND ADVANCED .7 DNX12 (GAUZE/BANDAGES/DRESSINGS) ×1 IMPLANT
DRAPE LAPAROTOMY 100X72 PEDS (DRAPES) ×3 IMPLANT
DRAPE UTILITY XL STRL (DRAPES) ×6 IMPLANT
ELECT COATED BLADE 2.86 ST (ELECTRODE) ×3 IMPLANT
ELECT REM PT RETURN 9FT ADLT (ELECTROSURGICAL) ×3
ELECTRODE REM PT RTRN 9FT ADLT (ELECTROSURGICAL) ×1 IMPLANT
GAUZE SPONGE 4X4 12PLY STRL (GAUZE/BANDAGES/DRESSINGS) ×3 IMPLANT
GLOVE EUDERMIC 7 POWDERFREE (GLOVE) ×3 IMPLANT
GOWN STRL REUS W/ TWL LRG LVL3 (GOWN DISPOSABLE) ×1 IMPLANT
GOWN STRL REUS W/ TWL XL LVL3 (GOWN DISPOSABLE) ×1 IMPLANT
GOWN STRL REUS W/TWL LRG LVL3 (GOWN DISPOSABLE) ×3
GOWN STRL REUS W/TWL XL LVL3 (GOWN DISPOSABLE) ×3
KIT BASIN OR (CUSTOM PROCEDURE TRAY) ×3 IMPLANT
KIT TURNOVER KIT B (KITS) ×3 IMPLANT
NEEDLE FILTER BLUNT 18X 1/2SAF (NEEDLE)
NEEDLE FILTER BLUNT 18X1 1/2 (NEEDLE) IMPLANT
NEEDLE HYPO 25GX1X1/2 BEV (NEEDLE) ×3 IMPLANT
NS IRRIG 1000ML POUR BTL (IV SOLUTION) ×3 IMPLANT
PACK SURGICAL SETUP 50X90 (CUSTOM PROCEDURE TRAY) ×3 IMPLANT
PAD ARMBOARD 7.5X6 YLW CONV (MISCELLANEOUS) ×3 IMPLANT
PENCIL BUTTON HOLSTER BLD 10FT (ELECTRODE) ×3 IMPLANT
SPONGE LAP 18X18 X RAY DECT (DISPOSABLE) ×3 IMPLANT
STRIP CLOSURE SKIN 1/4X4 (GAUZE/BANDAGES/DRESSINGS) IMPLANT
SUT MNCRL AB 4-0 PS2 18 (SUTURE) ×3 IMPLANT
SUT VIC AB 3-0 SH 18 (SUTURE) ×3 IMPLANT
SUT VIC AB 5-0 PS2 18 (SUTURE) IMPLANT
SYR BULB 3OZ (MISCELLANEOUS) ×3 IMPLANT
SYR CONTROL 10ML LL (SYRINGE) ×3 IMPLANT
TAPE CLOTH SURG 4X10 WHT LF (GAUZE/BANDAGES/DRESSINGS) ×3 IMPLANT
TOWEL OR 17X24 6PK STRL BLUE (TOWEL DISPOSABLE) ×3 IMPLANT
TOWEL OR 17X26 10 PK STRL BLUE (TOWEL DISPOSABLE) ×3 IMPLANT
TUBE CONNECTING 12'X1/4 (SUCTIONS)
TUBE CONNECTING 12X1/4 (SUCTIONS) IMPLANT
YANKAUER SUCT BULB TIP NO VENT (SUCTIONS) IMPLANT

## 2018-08-21 NOTE — Anesthesia Postprocedure Evaluation (Signed)
Anesthesia Post Note  Patient: Maria [name]  Procedure(s) Performed: RIGHT AXILLARY LYMPH NODE BIOPSY ERAS PATHWAY (Right Breast)     Patient location during evaluation: PACU Anesthesia Type: General Level of consciousness: awake and alert Pain management: pain level controlled Vital Signs Assessment: post-procedure vital signs reviewed and stable Respiratory status: spontaneous breathing, nonlabored ventilation, respiratory function stable and patient connected to nasal cannula oxygen Cardiovascular status: blood pressure returned to baseline and stable Postop Assessment: no apparent nausea or vomiting Anesthetic complications: no    Last Vitals:  Vitals:   08/21/18 1825 08/21/18 1830  BP: (!) 144/64   Pulse: 89 87  Resp: 14 13  Temp:    SpO2: 96% 95%    Last Pain:  Vitals:   08/21/18 1825  TempSrc:   PainSc: 0-No pain                 Remmy Crass P Barbra Miner

## 2018-08-21 NOTE — Transfer of Care (Signed)
Immediate Anesthesia Transfer of Care Note  Patient: Maria [name]  Procedure(s) Performed: RIGHT AXILLARY LYMPH NODE BIOPSY ERAS PATHWAY (Right Breast)  Patient Location: PACU  Anesthesia Type:General  Level of Consciousness: awake and patient cooperative  Airway & Oxygen Therapy: Patient Spontanous Breathing and Patient connected to face mask oxygen  Post-op Assessment: Report given to RN and Post -op Vital signs reviewed and stable  Post vital signs: Reviewed and stable  Last Vitals:  Vitals Value Taken Time  BP 153/65 08/21/2018  5:52 PM  Temp    Pulse 108 08/21/2018  5:54 PM  Resp 16 08/21/2018  5:54 PM  SpO2 100 % 08/21/2018  5:54 PM  Vitals shown include unvalidated device data.  Last Pain:  Vitals:   08/21/18 1752  TempSrc:   PainSc: (P) 0-No pain         Complications: No apparent anesthesia complications

## 2018-08-21 NOTE — Anesthesia Preprocedure Evaluation (Signed)
Anesthesia Evaluation  Patient identified by MRN, date of birth, ID band Patient awake    Reviewed: Allergy & Precautions, NPO status , Patient's Chart, lab work & pertinent test results  History of Anesthesia Complications Negative for: history of anesthetic complications  Airway Mallampati: III  TM Distance: <3 FB Neck ROM: Full    Dental  (+) Teeth Intact   Pulmonary neg pulmonary ROS,    breath sounds clear to auscultation       Cardiovascular hypertension, Pt. on medications  Rhythm:Regular     Neuro/Psych  Headaches, neg Seizures negative psych ROS   GI/Hepatic negative GI ROS, Neg liver ROS,   Endo/Other  diabetesHypothyroidism   Renal/GU negative Renal ROS     Musculoskeletal  (+) Arthritis ,   Abdominal   Peds  Hematology CLL with eval for lymphoma   Anesthesia Other Findings   Reproductive/Obstetrics                             Anesthesia Physical Anesthesia Plan  ASA: II  Anesthesia Plan: General   Post-op Pain Management:    Induction: Intravenous  PONV Risk Score and Plan: 3 and Ondansetron and Dexamethasone  Airway Management Planned: LMA  Additional Equipment: None  Intra-op Plan:   Post-operative Plan: Extubation in OR  Informed Consent: I have reviewed the patients History and Physical, chart, labs and discussed the procedure including the risks, benefits and alternatives for the proposed anesthesia with the patient or authorized representative who has indicated his/her understanding and acceptance.   Dental advisory given  Plan Discussed with: CRNA and Surgeon  Anesthesia Plan Comments:         Anesthesia Quick Evaluation

## 2018-08-21 NOTE — Anesthesia Procedure Notes (Signed)
Procedure Name: LMA Insertion Date/Time: 08/21/2018 5:01 PM Performed by: Kathryne Hitch, CRNA Pre-anesthesia Checklist: Patient identified, Emergency Drugs available, Suction available, Patient being monitored and Timeout performed Patient Re-evaluated:Patient Re-evaluated prior to induction Oxygen Delivery Method: Circle system utilized Preoxygenation: Pre-oxygenation with 100% oxygen Induction Type: IV induction Ventilation: Mask ventilation without difficulty LMA: LMA inserted LMA Size: 4.0 Placement Confirmation: positive ETCO2 and breath sounds checked- equal and bilateral Tube secured with: Tape Dental Injury: Teeth and Oropharynx as per pre-operative assessment

## 2018-08-21 NOTE — Interval H&P Note (Signed)
History and Physical Interval Note:  08/21/2018 2:23 PM  Maria [name]  has presented today for surgery, with the diagnosis of Chronic lymphoid leukemia  The various methods of treatment have been discussed with the patient and family. After consideration of risks, benefits and other options for treatment, the patient has consented to  Procedure(s) with comments: LEFT AXILLARY [place] (Right) - LMA as a surgical intervention .  The patient's history has been reviewed, patient examined, no change in status, stable for surgery.  I have reviewed the patient's chart and labs.  Questions were answered to the patient's satisfaction.     Adin Hector

## 2018-08-21 NOTE — Discharge Instructions (Signed)
Keep the ice pack on the wound, 10 minutes at a time, intermittently, for 36 hours  You may take a shower, starting tomorrow night No tub baths for 3 weeks No hot tubs  Move your shoulder around frequently to avoid stiffness  The clear plastic superglue will wear off in about 3 weeks  We will call the pathology report to you next week, hopefully by Wednesday The pathology report will be sent to your other doctors as well  Call Dr. Darrel Hoover office if there are any problems

## 2018-08-21 NOTE — Op Note (Signed)
Patient Name:           Maria [name]   Date of Surgery:        08/21/2018  Pre op Diagnosis:      Lymphoma  Post op Diagnosis:    Same  Procedure:                 Excision of deep left axillary lymph nodes  Surgeon:                     Edsel Petrin. Dalbert Batman, M.D., FACS  Assistant:                      OR staff  Operative Indications:    Maria [name] Location: [facility] Patient #: [id number] DOB: 06-10-1951 Single / Language: Maria [name] / Race: White Female       History of Present Illness  The patient is a 67 year old female who presents with a complaint of lymphoma and lymphadenopathy. She is  referred urgently by Dr. Jana [name] to see if we can get a lymph node biopsy done this week to clarify her diagnosis. Maria [name] is her PCP.      In 2002 she had an enlarging lymph node in her neck which was biopsied and showed chronic lymphoid leukemia. She was treated at [facility] in 2002 by  Dr. Alroy [name]. She relapsed and was treated in 2008 at [facility]. She is followed by Dr. Jana [name] and continues on Ibrutinib.      PET scan on July 23, 2018 showed extensive adenopathy in the neck, chest, abdomen and pelvis. Diagnostic right mammogram with tomography and ultrasound of the right axilla on 08/11/18 showed normal breast but enlarged right axillary lymph nodes. Radiology recommended core biopsy. 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 Discussion with pathology revealed there unable to make definitive determination and requested more tissue. I have agreed to see her and proceed with axillary lymph node biopsy.      She is otherwise feeling fine and pretty healthy.      Exam reveals a 2 cm palpable lymph nodes in both axillae. Most likely we will be able to get more than adequate tissue sampling this way.  She'll be scheduled for excision deep axillary lymph node biopsy. Possibly left. I discussed the  indications, details, techniques, and risk of the surgery with her in detail.  She agrees with this plan.   Operative Findings:       I removed 2 or 3 matted lymph nodes from the left axilla, level 1.  These were very soft.  There were other lymph nodes higher in the axilla but I removed more than adequate tissue.  Minimal bleeding  Procedure in Detail:          Following the induction of general LMA anesthesia the patient's left chest wall and axilla were prepped and draped in a sterile fashion.  Intravenous antibiotics were given.  Surgical timeout was performed.  0.25% Marcaine with epinephrine was used as a local infiltration anesthetic.     A transverse incision was made at the hairline in the left axilla.  Electrocautery dissection was carried down through the subcutaneous tissue opening up the clavipectoral fascia.  We found the above described lymph nodes and slowly in small steps dissected these away from their surrounding lymphatics and vascular attachments.  Lymphatics and vascular attachments were controlled with metal clips and divided and  the nodes removed and sent fresh to the lab in saline.  Hemostasis was excellent.  The wound was irrigated.  The clavipectoral fascia was closed with 3-0 Vicryl sutures and the skin closed with a running subcuticular 4-0 Monocryl and Dermabond.  The patient tolerated the procedure well and was taken to PACU in stable condition.  EBL 15 to 20 cc.  Counts correct.  Complications none.   Addendum: I logged onto the [employer] and reviewed her prescription medication history     Maria [name] M. Dalbert Batman, M.D., FACS General and Minimally Invasive Surgery Breast and Colorectal Surgery  08/21/2018 5:33 PM

## 2018-08-22 ENCOUNTER — Encounter (HOSPITAL_COMMUNITY): Payer: Self-pay | Admitting: General Surgery

## 2018-08-27 ENCOUNTER — Inpatient Hospital Stay: Payer: Medicare Other

## 2018-08-27 VITALS — BP 146/57 | HR 76 | Temp 98.1°F | Resp 16 | Wt 195.0 lb

## 2018-08-27 DIAGNOSIS — Z5112 Encounter for antineoplastic immunotherapy: Secondary | ICD-10-CM | POA: Diagnosis not present

## 2018-08-27 DIAGNOSIS — C9110 Chronic lymphocytic leukemia of B-cell type not having achieved remission: Secondary | ICD-10-CM

## 2018-08-27 LAB — LACTATE DEHYDROGENASE: LDH: 392 U/L — ABNORMAL HIGH (ref 98–192)

## 2018-08-27 LAB — CBC WITH DIFFERENTIAL/PLATELET
Abs Immature Granulocytes: 0 10*3/uL (ref 0.00–0.07)
Basophils Absolute: 0.2 10*3/uL — ABNORMAL HIGH (ref 0.0–0.1)
Basophils Relative: 1 %
EOS PCT: 1 %
Eosinophils Absolute: 0.2 10*3/uL (ref 0.0–0.5)
HEMATOCRIT: 41.6 % (ref 36.0–46.0)
Hemoglobin: 13.7 g/dL (ref 12.0–15.0)
Lymphocytes Relative: 62 %
Lymphs Abs: 11.2 10*3/uL — ABNORMAL HIGH (ref 0.7–4.0)
MCH: 28.4 pg (ref 26.0–34.0)
MCHC: 32.9 g/dL (ref 30.0–36.0)
MCV: 86.1 fL (ref 80.0–100.0)
Monocytes Absolute: 0.7 10*3/uL (ref 0.1–1.0)
Monocytes Relative: 4 %
Neutro Abs: 5.8 10*3/uL (ref 1.7–17.7)
Neutrophils Relative %: 32 %
Platelets: 137 10*3/uL — ABNORMAL LOW (ref 150–400)
RBC: 4.83 MIL/uL (ref 3.87–5.11)
RDW: 15.1 % (ref 11.5–15.5)
WBC: 18.1 10*3/uL — ABNORMAL HIGH (ref 4.0–10.5)
nRBC: 0 % (ref 0.0–0.2)

## 2018-08-27 LAB — COMPREHENSIVE METABOLIC PANEL
ALT: 15 U/L (ref 0–44)
AST: 25 U/L (ref 15–41)
Albumin: 3.7 g/dL (ref 3.5–5.0)
Alkaline Phosphatase: 82 U/L (ref 38–126)
Anion gap: 11 (ref 5–15)
BUN: 16 mg/dL (ref 8–23)
CALCIUM: 9.9 mg/dL (ref 8.9–10.3)
CO2: 25 mmol/L (ref 22–32)
CREATININE: 1.17 mg/dL — AB (ref 0.44–1.00)
Chloride: 105 mmol/L (ref 98–111)
GFR calc Af Amer: 56 mL/min — ABNORMAL LOW (ref >60)
GFR calc non Af Amer: 48 mL/min — ABNORMAL LOW (ref >60)
Glucose, Bld: 113 mg/dL — ABNORMAL HIGH (ref 70–99)
Potassium: 3.8 mmol/L (ref 3.5–5.1)
Sodium: 141 mmol/L (ref 135–145)
Total Bilirubin: 0.8 mg/dL (ref 0.3–1.2)
Total Protein: 5.9 g/dL — ABNORMAL LOW (ref 6.5–8.1)

## 2018-08-27 MED ORDER — ACETAMINOPHEN 325 MG PO TABS
650.0000 mg | ORAL_TABLET | Freq: Once | ORAL | Status: AC
  Administered 2018-08-27: 650 mg via ORAL

## 2018-08-27 MED ORDER — SODIUM CHLORIDE 0.9 % IV SOLN
20.0000 mg | Freq: Once | INTRAVENOUS | Status: AC
  Administered 2018-08-27: 20 mg via INTRAVENOUS
  Filled 2018-08-27: qty 2

## 2018-08-27 MED ORDER — SODIUM CHLORIDE 0.9 % IV SOLN
100.0000 mg | Freq: Once | INTRAVENOUS | Status: AC
  Administered 2018-08-27: 100 mg via INTRAVENOUS
  Filled 2018-08-27: qty 10

## 2018-08-27 MED ORDER — SODIUM CHLORIDE 0.9 % IV SOLN
Freq: Once | INTRAVENOUS | Status: AC
  Administered 2018-08-27: 09:00:00 via INTRAVENOUS
  Filled 2018-08-27: qty 250

## 2018-08-27 MED ORDER — ACETAMINOPHEN 325 MG PO TABS
ORAL_TABLET | ORAL | Status: AC
  Filled 2018-08-27: qty 2

## 2018-08-27 MED ORDER — DIPHENHYDRAMINE HCL 25 MG PO CAPS
ORAL_CAPSULE | ORAL | Status: AC
  Filled 2018-08-27: qty 1

## 2018-08-27 MED ORDER — DIPHENHYDRAMINE HCL 25 MG PO CAPS
25.0000 mg | ORAL_CAPSULE | Freq: Once | ORAL | Status: AC
  Administered 2018-08-27: 25 mg via ORAL

## 2018-08-27 NOTE — Progress Notes (Signed)
Per Dr. Jana Hakim, it is ok to proceed with Rituximab today without hep B results. Hep B labs added on to lab from draw this am.

## 2018-08-27 NOTE — Progress Notes (Addendum)
[facility]  [phone] [phone]   ID: Maria [name]   DOB: [birth year]  MR#: [id number]  W[place]O#:[id number]  Patient Care Team: Marton Redwood, MD as PCP - General (Internal Medicine) [name], MD as Consulting Physician (Orthopedic Surgery) Stephie Xu, [name] Dad, MD as Consulting Physician (Oncology)   CHIEF COMPLAINT: Small lymphocytic lymphoma   CURRENT TREATMENT: Ibrutinib, rituximab   HISTORY OF PRESENT ILLNESS: From the original intake note:  Maria [name] developed what she thought was "the flu" in December of 2002. She noted a large lymph node developing in her right anterior cervical area. 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 She is seen in the treatment area.  She is having disease progression. Some nodes have come on her neck. She had a place on the back of her head that was really painful.   Yesterday she received a test dose of rituximab and she tolerated that well.  She is receiving her first full dose today.  She is receiving some steroids as premeds.  She has already had some lymph node decrease likely secondary to steroids.  This has helped with the discomfort she was having in her neck.  The patient continues on Ibrutinib, which she is tolerating well. She is experiencing neither rash nor itching. She has bruising.   Since her last visit here, she underwent a right axiliary lymph node biospy ([id number]) on 08/11/2018 showing small lymphocytic lymphoma with features concerning for disease progression.  However there is no evidence of Richter's transformation.  That is very good news   REVIEW OF SYSTEMS: Arleene is doing well overall. The patient denies typical 'B' symptoms of fever, diaphoresis, weight loss,  or fatigue. The patient denies unusual headaches, visual changes, nausea, vomiting, or dizziness. There has been no unusual cough, phlegm production, or pleurisy. This been no change in bowel or bladder habits. The patient denies bleeding or rash. A detailed review of systems was otherwise noncontributory.    PAST MEDICAL HISTORY: Past Medical History:  Diagnosis Date  . Allergy   . Arthritis   . Asthma   . Cancer [facility])    chronic lymphacytic leukemia  . CLL (chronic lymphocytic leukemia) ([place])   . Diabetes mellitus    PMH  . Headache    migraine  . Hyperlipidemia   . Hypertension   . Hypothyroidism   . Lymphoma ([place])   . Thyroid disease    hypothyroidism  . Wears glasses     PAST SURGICAL HISTORY: Past Surgical History:  Procedure Laterality Date  . ABDOMINAL HYSTERECTOMY    . AXILLARY LYMPH NODE BIOPSY Right 08/21/2018   Procedure: RIGHT AXILLARY LYMPH NODE BIOPSY ERAS PATHWAY;  Surgeon: [name], MD;  Location: [facility];  Service: General;  Laterality: Right;  LMA  . BREAST EXCISIONAL BIOPSY Right   . COLONOSCOPY    . LYMPH NODE DISSECTION    . STRABISMUS SURGERY    . TONSILLECTOMY    . TOTAL KNEE ARTHROPLASTY Right 12/03/2016   Procedure: RIGHT TOTAL KNEE ARTHROPLASTY;  Surgeon: [name], MD;  Location: [facility];  Service: Orthopedics;  Laterality: Right;    FAMILY HISTORY Family History  Problem Relation Age of Onset  . Liver cancer Mother   . Heart disease Father   . Breast cancer Sister  67  . Colon cancer Neg Hx   . Pancreatic cancer Neg Hx   . Rectal cancer Neg Hx   . Stomach cancer Neg Hx    The patient's father died at the age of 47 from a myocardial infarction. The patient's mother died at the age of 52 from primary liver carcinoma. The patient had no brothers. Her one sister, Maria [name], has a history of breast cancer  GYNECOLOGIC HISTORY: Menarche at around age 67. The patient is GX P0. 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 She lives by herself with her cats [place] and [place]. She attends a local [place]: Not in place  HEALTH MAINTENANCE: Social History   Tobacco Use  . Smoking status: Never Smoker  . Smokeless tobacco: Never Used  Substance Use Topics  . Alcohol use: Yes    Comment: occasional alcohol intake; once or twice monthly  . Drug use: No     Colonoscopy: 06/01/2014/[facility]  PAP:  Bone density:  Lipid panel:  228/114/50/155/ratio 4.6  On 10/01/2016  Allergies  Allergen Reactions  . Iohexol Swelling and Rash  . Contrast Media  [Iodinated Diagnostic Agents]   . Lactose Intolerance (Gi)     Diarrhea, gas bloating  . Lactulose Diarrhea and Other (See Comments)    gas bloating  . Cephalexin Rash    Current Outpatient Medications  Medication Sig Dispense Refill  . hydrochlorothiazide (HYDRODIURIL) 12.5 MG tablet Take 12.5 mg by mouth daily.     Marland Kitchen HYDROcodone-acetaminophen (NORCO) 5-325 MG tablet Take 1-2 tablets by mouth every 6 (six) hours as needed for moderate pain or severe pain. 20 tablet 0  . Ibrutinib (IMBRUVICA) 420 MG TABS Take 1 tablet by mouth at bedtime. (Patient taking differently: Take 420 mg by mouth at bedtime. ) 28 tablet 2  . rosuvastatin (CRESTOR) 10 MG tablet Take 10 mg by mouth at bedtime.  11  . SYNTHROID 88 MCG tablet Take 88 mcg by mouth daily before breakfast.     . vitamin B-12 (CYANOCOBALAMIN) 500 MCG tablet Take 500 mcg by mouth daily.    . zoledronic acid (RECLAST) 5 MG/100ML SOLN injection Inject 5 mg into the vein once. Once a year     No current facility-administered medications for this visit.    Facility-Administered Medications Ordered in Other Visits  Medication Dose Route Frequency Provider Last Rate Last Dose  . 0.9 %  sodium chloride infusion    Intravenous Once Harrison Zetina, [name] Dad, MD        OBJECTIVE: Middle-aged white woman in no acute distress  There were no vitals filed for this visit.   There is no height or weight on file to calculate BMI.    ECOG FS: 0 For today's vitals please see the treatment area data sheet  Sclerae unicteric, pupils round and equal Lungs no rales or rhonchi Heart regular rate and rhythm Abd soft, positive bowel sounds Neuro: nonfocal, well oriented, appropriate affect Breasts: Deferred  LAB RESULTS:  Lab Results  Component Value Date   WBC 18.1 (H) 08/27/2018   NEUTROABS 5.8 08/27/2018   HGB 13.7 08/27/2018   HCT 41.6 08/27/2018   MCV 86.1 08/27/2018   PLT 137 (L) 08/27/2018      Chemistry      Component Value Date/Time   NA 141 08/27/2018  0755   NA 139 08/05/2017 1353   K 3.8 08/27/2018 0755   K 3.7 08/05/2017 1353   CL 105 08/27/2018 0755   CO2 25 08/27/2018 0755   CO2 27 08/05/2017 1353   BUN 16 08/27/2018 0755   BUN 17.3 08/05/2017 1353   CREATININE 1.17 (H) 08/27/2018 0755   CREATININE 1.0 08/05/2017 1353   GLU 101 (H) 10/18/2009 1542      Component Value Date/Time   CALCIUM 9.9 08/27/2018 0755   CALCIUM 9.2 08/05/2017 1353   ALKPHOS 82 08/27/2018 0755   ALKPHOS 76 08/05/2017 1353   AST 25 08/27/2018 0755   AST 18 08/05/2017 1353   ALT 15 08/27/2018 0755   ALT 14 08/05/2017 1353   BILITOT 0.8 08/27/2018 0755   BILITOT 0.59 08/05/2017 1353       No results found for: LABCA2  No components found for: LABCA125  No results for input(s): INR in the last 168 hours.  Urinalysis    Component Value Date/Time   COLORURINE LT. YELLOW 01/24/2010 1017   APPEARANCEUR CLEAR 01/24/2010 1017   LABSPEC <=1.005 01/24/2010 1017   PHURINE 5.5 01/24/2010 1017   GLUCOSEU NEGATIVE 01/24/2010 [place] 01/24/2010 1017   KETONESUR NEGATIVE 01/24/2010 1017   UROBILINOGEN 0.2 01/24/2010 1017   NITRITE NEGATIVE 01/24/2010 1017   LEUKOCYTESUR NEGATIVE  01/24/2010 1017    STUDIES: Mm Diag Breast Tomo Bilateral  Result Date: 08/11/2018 CLINICAL DATA:  67 year old female with history of CLL presenting for evaluation of enlarged right axillary lymph nodes identified on a recent PET exam. EXAM: DIGITAL DIAGNOSTIC RIGHT MAMMOGRAM WITH CAD AND TOMO ULTRASOUND RIGHT AXILLA COMPARISON:  Previous exam(s). ACR Breast Density Category b: There are scattered areas of fibroglandular density. FINDINGS: No suspicious calcifications, masses or areas of distortion are seen in the bilateral breasts. There are multiple enlarged lymph nodes seen in the right axilla. Mammographic images were processed with CAD. Ultrasound of the right axilla demonstrates numerous abnormally thickened lymph nodes. The cortex of 1 of the thickest lymph nodes measures up to 1 cm. IMPRESSION: 1.  There are multiple enlarged right axillary lymph nodes. 2.  No mammographic evidence of malignancy in the bilateral breasts. RECOMMENDATION: Ultrasound guided biopsy is recommended for 1 of the right axillary lymph nodes. This procedure is scheduled for later today and will be dictated in a separate report. I have discussed the findings and recommendations with the patient. Results were also provided in writing at the conclusion of the visit. If applicable, a reminder letter will be sent to the patient regarding the next appointment. BI-RADS CATEGORY  4: Suspicious. Electronically Signed   By: Ammie Ferrier M.D.   On: 08/11/2018 13:56   [place] Axillary Node Core Biopsy Right  Addendum Date: 08/18/2018   ADDENDUM REPORT: 08/18/2018 08:06 ADDENDUM: Pathology revealed SMALL LYMPHOCYTIC LYMPHOMA WITH FEATURES CONCERNING FOR DISEASE PROGRESSION of the Right axillary lymph node. Tissue-Flow Cytometry revealed MONOCLONAL B CELL POPULATION IDENTIFIED. It is difficult to know for certainty whether some areas with increased larger lymphoid cells represent large and confluent proliferation centers or may actually  represent large B-cell lymphoma transformation (Richter's transformation). Excisional biopsy material may be extremely helpful in this regard, per Dr. Susanne Greenhouse, Pathologist. This was found to be concordant by Dr. Peggye Fothergill. Pathology results were discussed with the patient in person by Dr. Lurline Del on August 15, 2018. The patient reported doing well after the biopsy with tenderness at the site. Post biopsy instructions and care  were reviewed and questions were answered. Surgical consultation has been arranged with Dr. [name] at [facility] on August 18, 2018. Pathology results reported by Terie Purser, RN on 08/18/2018. Electronically Signed   By: Evangeline Dakin M.D.   On: 08/18/2018 08:06   Result Date: 08/18/2018 CLINICAL DATA:  67 year old with long history of non-Hodgkin's lymphoma small lymphocytic type/CLL, with recent PET-CT demonstrating progressively enlarging metabolically active lymphadenopathy throughout the neck, chest, abdomen and pelvis. Diagnostic mammography earlier today demonstrated no mammographic evidence of malignancy. EXAM: ULTRASOUND GUIDED CORE NEEDLE BIOPSY OF A RIGHT AXILLARY NODE COMPARISON:  Previous exam(s). FINDINGS: I met with the patient and we discussed the procedure of ultrasound-guided biopsy, including benefits and alternatives. We discussed the high likelihood of a successful procedure. We discussed the risks of the procedure, including infection, bleeding, tissue injury, clip migration, and inadequate sampling. Informed written consent was given. The usual time-out protocol was performed immediately prior to the procedure. Using sterile technique with chlorhexidine as skin antisepsis, 1% lidocaine and 1% lidocaine with epinephrine as local anesthetic, under direct ultrasound visualization, a 14 gauge Bard Marquee core needle device placed through a 13 gauge introducer needle was used to perform biopsy of a pathologic RIGHT axillary lymph  node using an inferolateral approach. Three core samples were placed in formalin and 4 core samples were placed in saline. At the conclusion of the procedure a HydroMark spiral shaped tissue marker clip was deployed into the biopsy cavity. IMPRESSION: Ultrasound guided biopsy of a pathologic RIGHT axillary lymph node. No apparent complications. Electronically Signed: By: Evangeline Dakin M.D. On: 08/11/2018 14:40   [place] Axilla Right  Result Date: 08/11/2018 CLINICAL DATA:  67 year old female with history of CLL presenting for evaluation of enlarged right axillary lymph nodes identified on a recent PET exam. EXAM: DIGITAL DIAGNOSTIC RIGHT MAMMOGRAM WITH CAD AND TOMO ULTRASOUND RIGHT AXILLA COMPARISON:  Previous exam(s). ACR Breast Density Category b: There are scattered areas of fibroglandular density. FINDINGS: No suspicious calcifications, masses or areas of distortion are seen in the bilateral breasts. There are multiple enlarged lymph nodes seen in the right axilla. Mammographic images were processed with CAD. Ultrasound of the right axilla demonstrates numerous abnormally thickened lymph nodes. The cortex of 1 of the thickest lymph nodes measures up to 1 cm. IMPRESSION: 1.  There are multiple enlarged right axillary lymph nodes. 2.  No mammographic evidence of malignancy in the bilateral breasts. RECOMMENDATION: Ultrasound guided biopsy is recommended for 1 of the right axillary lymph nodes. This procedure is scheduled for later today and will be dictated in a separate report. I have discussed the findings and recommendations with the patient. Results were also provided in writing at the conclusion of the visit. If applicable, a reminder letter will be sent to the patient regarding the next appointment. BI-RADS CATEGORY  4: Suspicious. Electronically Signed   By: Ammie Ferrier M.D.   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 There was no CD10 or cyclin D1 positivity identified  (4) started ibrutinib at 420 mg/ day 08/09/2014  (a) PET scan 07/23/2018 shows progressive  adenopathy diffusely  (b) right axillary lymph node core biopsy shows features concerning but not definitive for Darron Doom transformation  (5) evidence of disease progression November 2019  (a) PET scan 07/23/2018 shows extensive progressive adenopathy.  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  This is very good news.  She tolerated her first dose of right toxin Mab yesterday without event.  She is doing well so far with today's dose.  If she does tolerate this well we should be able to give it a little bit faster with the subsequent doses.  This is a difficult time of year to schedule weekly treatments but I am hopeful we can work her in a week from today and then again 2 weeks from today.  The plan will be for 8 weekly doses of rituximab and then reassess.  She will definitely need maintenance.  She may or may not need chemotherapy, which in his case probably would consist of bendamustine.  She will see me again in 2 weeks.  She knows to call for any issues that may develop before that visit.   Mouna Yager, [name] Dad, MD  08/28/18 10:55  AM Medical Oncology and Hematology [facility] [address] [place], [place] [address] Tel. [phone]    Fax. [phone]    I, Jacqualyn Posey am acting as a Education administrator for Chauncey Cruel, MD.   I, Lurline Del MD, have reviewed the above documentation for accuracy and completeness, and I agree with the above.

## 2018-08-27 NOTE — Patient Instructions (Signed)
[facility] Discharge Instructions for Patients Receiving Chemotherapy  Today you received the following chemotherapy agents Rituximab.  To help prevent nausea and vomiting after your treatment, we encourage you to take your nausea medication as directed.  If you develop nausea and vomiting that is not controlled by your nausea medication, call the clinic.   BELOW ARE SYMPTOMS THAT SHOULD BE REPORTED IMMEDIATELY:  *FEVER GREATER THAN 100.5 F  *CHILLS WITH OR WITHOUT FEVER  NAUSEA AND VOMITING THAT IS NOT CONTROLLED WITH YOUR NAUSEA MEDICATION  *UNUSUAL SHORTNESS OF BREATH  *UNUSUAL BRUISING OR BLEEDING  TENDERNESS IN MOUTH AND THROAT WITH OR WITHOUT PRESENCE OF ULCERS  *URINARY PROBLEMS  *BOWEL PROBLEMS  UNUSUAL RASH Items with * indicate a potential emergency and should be followed up as soon as possible.  Feel free to call the clinic should you have any questions or concerns. The clinic phone number is [phone].  Please show the [facility] at check-in to the Emergency Department and triage nurse.  Rituximab injection What is this medicine? RITUXIMAB (ri TUX i mab) is a monoclonal antibody. It is used to treat certain types of cancer like non-Hodgkin lymphoma and chronic lymphocytic leukemia. It is also used to treat rheumatoid arthritis, granulomatosis with polyangiitis (or Wegener's granulomatosis), microscopic polyangiitis, and pemphigus vulgaris. This medicine may be used for other purposes; ask your health care provider or pharmacist if you have questions. COMMON BRAND NAME(S): Rituxan What should I tell my health care provider before I take this medicine? They need to know if you have any of these conditions: -heart disease -infection (especially a virus infection such as hepatitis B, chickenpox, cold sores, or herpes) -immune system problems -irregular heartbeat -kidney disease -low blood counts, like low white cell, platelet, or red cell  counts -lung or breathing disease, like asthma -recently received or scheduled to receive a vaccine -an unusual or allergic reaction to rituximab, other medicines, foods, dyes, or preservatives -pregnant or trying to get pregnant -breast-feeding How should I use this medicine? This medicine is for infusion into a vein. It is administered in a hospital or clinic by a specially trained health care professional. A special MedGuide will be given to you by the pharmacist with each prescription and refill. Be sure to read this information carefully each time. Talk to your pediatrician regarding the use of this medicine in children. This medicine is not approved for use in children. Overdosage: If you think you have taken too much of this medicine contact a poison control center or emergency room at once. NOTE: This medicine is only for you. Do not share this medicine with others. What if I miss a dose? It is important not to miss a dose. Call your doctor or health care professional if you are unable to keep an appointment. What may interact with this medicine? -cisplatin -live virus vaccines This list may not describe all possible interactions. Give your health care provider a list of all the medicines, herbs, non-prescription drugs, or dietary supplements you use. Also tell them if you smoke, drink alcohol, or use illegal drugs. Some items may interact with your medicine. What should I watch for while using this medicine? Your condition will be monitored carefully while you are receiving this medicine. You may need blood work done while you are taking this medicine. This medicine can cause serious allergic reactions. To reduce your risk you may need to take medicine before treatment with this medicine. Take your medicine as directed. In  some patients, this medicine may cause a serious brain infection that may cause death. If you have any problems seeing, thinking, speaking, walking, or standing, tell  your healthcare professional right away. If you cannot reach your healthcare professional, urgently seek other source of medical care. Call your doctor or health care professional for advice if you get a fever, chills or sore throat, or other symptoms of a cold or flu. Do not treat yourself. This drug decreases your body's ability to fight infections. Try to avoid being around people who are sick. Do not become pregnant while taking this medicine or for 12 months after stopping it. Women should inform their doctor if they wish to become pregnant or think they might be pregnant. There is a potential for serious side effects to an unborn child. Talk to your health care professional or pharmacist for more information. Do not breast-feed an infant while taking this medicine or for 6 months after stopping it. What side effects may I notice from receiving this medicine? Side effects that you should report to your doctor or health care professional as soon as possible: -allergic reactions like skin rash, itching or hives; swelling of the face, lips, or tongue -breathing problems -chest pain -changes in vision -diarrhea -headache with fever, neck stiffness, sensitivity to light, nausea, or confusion -fast, irregular heartbeat -loss of memory -low blood counts - this medicine may decrease the number of white blood cells, red blood cells and platelets. You may be at increased risk for infections and bleeding. -mouth sores -problems with balance, talking, or walking -redness, blistering, peeling or loosening of the skin, including inside the mouth -signs of infection - fever or chills, cough, sore throat, pain or difficulty passing urine -signs and symptoms of kidney injury like trouble passing urine or change in the amount of urine -signs and symptoms of liver injury like dark yellow or brown urine; general ill feeling or flu-like symptoms; light-colored stools; loss of appetite; nausea; right upper belly  pain; unusually weak or tired; yellowing of the eyes or skin -signs and symptoms of low blood pressure like dizziness; feeling faint or lightheaded, falls; unusually weak or tired -stomach pain -swelling of the ankles, feet, hands -unusual bleeding or bruising -vomiting Side effects that usually do not require medical attention (report to your doctor or health care professional if they continue or are bothersome): -headache -joint pain -muscle cramps or muscle pain -nausea -tiredness This list may not describe all possible side effects. Call your doctor for medical advice about side effects. You may report side effects to FDA at 1-800-FDA-1088. Where should I keep my medicine? This drug is given in a hospital or clinic and will not be stored at home. NOTE: This sheet is a summary. It may not cover all possible information. If you have questions about this medicine, talk to your doctor, pharmacist, or health care provider.  2019 Elsevier/Gold Standard (2017-08-09 13:04:32)

## 2018-08-27 NOTE — Progress Notes (Signed)
Per Reyne Dumas, [place] it is ok to divide the rate and volume in half for each 30 minute increment increase of Rituxan. Calculated rate for start of infusion is rate 130 and volume 65. Per M. Lynnell Catalan, [place] ok to divide that in half and start at a rate of 65 and a volume of 33. See MAR for rate increases.

## 2018-08-27 NOTE — Progress Notes (Signed)
Confirmed w/ MD - give full rituximab 375mg /m2 (800mg ) on 12/19 if the 100mg  dose was tolerated on 12/18.   Rituximab was run at half the normal first time rate on 12/18 (100mg  dose). Run the 12/19 rituximab infusion as a first infusion.   Demetrius Charity, PharmD, [facility] Pharmacist Pharmacy Phone: [phone] 08/27/2018

## 2018-08-28 ENCOUNTER — Inpatient Hospital Stay: Payer: Medicare Other

## 2018-08-28 ENCOUNTER — Inpatient Hospital Stay: Payer: Medicare Other | Admitting: Oncology

## 2018-08-28 VITALS — BP 152/58 | HR 65 | Temp 98.7°F | Resp 16

## 2018-08-28 DIAGNOSIS — C9110 Chronic lymphocytic leukemia of B-cell type not having achieved remission: Secondary | ICD-10-CM

## 2018-08-28 DIAGNOSIS — Z5112 Encounter for antineoplastic immunotherapy: Secondary | ICD-10-CM | POA: Diagnosis not present

## 2018-08-28 LAB — HEPATITIS B CORE ANTIBODY, TOTAL: Hep B Core Total Ab: NEGATIVE

## 2018-08-28 LAB — IGG, IGA, IGM
IgA: 112 mg/dL (ref 87–352)
IgG (Immunoglobin G), Serum: 354 mg/dL — ABNORMAL LOW (ref 700–1600)
IgM (Immunoglobulin M), Srm: 20 mg/dL — ABNORMAL LOW (ref 26–217)

## 2018-08-28 LAB — HEPATITIS B SURFACE ANTIBODY,QUALITATIVE: Hep B S Ab: NONREACTIVE

## 2018-08-28 LAB — HEPATITIS B SURFACE ANTIGEN: HEP B S AG: NEGATIVE

## 2018-08-28 MED ORDER — DIPHENHYDRAMINE HCL 25 MG PO CAPS
ORAL_CAPSULE | ORAL | Status: AC
  Filled 2018-08-28: qty 1

## 2018-08-28 MED ORDER — SODIUM CHLORIDE 0.9 % IV SOLN
Freq: Once | INTRAVENOUS | Status: DC
  Filled 2018-08-28: qty 250

## 2018-08-28 MED ORDER — DIPHENHYDRAMINE HCL 25 MG PO CAPS
25.0000 mg | ORAL_CAPSULE | Freq: Once | ORAL | Status: AC
  Administered 2018-08-28: 25 mg via ORAL

## 2018-08-28 MED ORDER — SODIUM CHLORIDE 0.9 % IV SOLN
20.0000 mg | Freq: Once | INTRAVENOUS | Status: AC
  Administered 2018-08-28: 20 mg via INTRAVENOUS
  Filled 2018-08-28: qty 2

## 2018-08-28 MED ORDER — ACETAMINOPHEN 325 MG PO TABS
650.0000 mg | ORAL_TABLET | Freq: Once | ORAL | Status: AC
  Administered 2018-08-28: 650 mg via ORAL

## 2018-08-28 MED ORDER — ACETAMINOPHEN 325 MG PO TABS
ORAL_TABLET | ORAL | Status: AC
  Filled 2018-08-28: qty 2

## 2018-08-28 MED ORDER — SODIUM CHLORIDE 0.9 % IV SOLN
375.0000 mg/m2 | Freq: Once | INTRAVENOUS | Status: AC
  Administered 2018-08-28: 800 mg via INTRAVENOUS
  Filled 2018-08-28: qty 30

## 2018-08-28 NOTE — Patient Instructions (Signed)
[facility] Discharge Instructions for Patients Receiving Chemotherapy  Today you received the following chemotherapy agents Rituximab.  To help prevent nausea and vomiting after your treatment, we encourage you to take your nausea medication as directed.  If you develop nausea and vomiting that is not controlled by your nausea medication, call the clinic.   BELOW ARE SYMPTOMS THAT SHOULD BE REPORTED IMMEDIATELY:  *FEVER GREATER THAN 100.5 F  *CHILLS WITH OR WITHOUT FEVER  NAUSEA AND VOMITING THAT IS NOT CONTROLLED WITH YOUR NAUSEA MEDICATION  *UNUSUAL SHORTNESS OF BREATH  *UNUSUAL BRUISING OR BLEEDING  TENDERNESS IN MOUTH AND THROAT WITH OR WITHOUT PRESENCE OF ULCERS  *URINARY PROBLEMS  *BOWEL PROBLEMS  UNUSUAL RASH Items with * indicate a potential emergency and should be followed up as soon as possible.  Feel free to call the clinic should you have any questions or concerns. The clinic phone number is [phone].  Please show the [facility] at check-in to the Emergency Department and triage nurse.  Rituximab injection What is this medicine? RITUXIMAB (ri TUX i mab) is a monoclonal antibody. It is used to treat certain types of cancer like non-Hodgkin lymphoma and chronic lymphocytic leukemia. It is also used to treat rheumatoid arthritis, granulomatosis with polyangiitis (or Wegener's granulomatosis), microscopic polyangiitis, and pemphigus vulgaris. This medicine may be used for other purposes; ask your health care provider or pharmacist if you have questions. COMMON BRAND NAME(S): Rituxan What should I tell my health care provider before I take this medicine? They need to know if you have any of these conditions: -heart disease -infection (especially a virus infection such as hepatitis B, chickenpox, cold sores, or herpes) -immune system problems -irregular heartbeat -kidney disease -low blood counts, like low white cell, platelet, or red cell  counts -lung or breathing disease, like asthma -recently received or scheduled to receive a vaccine -an unusual or allergic reaction to rituximab, other medicines, foods, dyes, or preservatives -pregnant or trying to get pregnant -breast-feeding How should I use this medicine? This medicine is for infusion into a vein. It is administered in a hospital or clinic by a specially trained health care professional. A special MedGuide will be given to you by the pharmacist with each prescription and refill. Be sure to read this information carefully each time. Talk to your pediatrician regarding the use of this medicine in children. This medicine is not approved for use in children. Overdosage: If you think you have taken too much of this medicine contact a poison control center or emergency room at once. NOTE: This medicine is only for you. Do not share this medicine with others. What if I miss a dose? It is important not to miss a dose. Call your doctor or health care professional if you are unable to keep an appointment. What may interact with this medicine? -cisplatin -live virus vaccines This list may not describe all possible interactions. Give your health care provider a list of all the medicines, herbs, non-prescription drugs, or dietary supplements you use. Also tell them if you smoke, drink alcohol, or use illegal drugs. Some items may interact with your medicine. What should I watch for while using this medicine? Your condition will be monitored carefully while you are receiving this medicine. You may need blood work done while you are taking this medicine. This medicine can cause serious allergic reactions. To reduce your risk you may need to take medicine before treatment with this medicine. Take your medicine as directed. In  some patients, this medicine may cause a serious brain infection that may cause death. If you have any problems seeing, thinking, speaking, walking, or standing, tell  your healthcare professional right away. If you cannot reach your healthcare professional, urgently seek other source of medical care. Call your doctor or health care professional for advice if you get a fever, chills or sore throat, or other symptoms of a cold or flu. Do not treat yourself. This drug decreases your body's ability to fight infections. Try to avoid being around people who are sick. Do not become pregnant while taking this medicine or for 12 months after stopping it. Women should inform their doctor if they wish to become pregnant or think they might be pregnant. There is a potential for serious side effects to an unborn child. Talk to your health care professional or pharmacist for more information. Do not breast-feed an infant while taking this medicine or for 6 months after stopping it. What side effects may I notice from receiving this medicine? Side effects that you should report to your doctor or health care professional as soon as possible: -allergic reactions like skin rash, itching or hives; swelling of the face, lips, or tongue -breathing problems -chest pain -changes in vision -diarrhea -headache with fever, neck stiffness, sensitivity to light, nausea, or confusion -fast, irregular heartbeat -loss of memory -low blood counts - this medicine may decrease the number of white blood cells, red blood cells and platelets. You may be at increased risk for infections and bleeding. -mouth sores -problems with balance, talking, or walking -redness, blistering, peeling or loosening of the skin, including inside the mouth -signs of infection - fever or chills, cough, sore throat, pain or difficulty passing urine -signs and symptoms of kidney injury like trouble passing urine or change in the amount of urine -signs and symptoms of liver injury like dark yellow or brown urine; general ill feeling or flu-like symptoms; light-colored stools; loss of appetite; nausea; right upper belly  pain; unusually weak or tired; yellowing of the eyes or skin -signs and symptoms of low blood pressure like dizziness; feeling faint or lightheaded, falls; unusually weak or tired -stomach pain -swelling of the ankles, feet, hands -unusual bleeding or bruising -vomiting Side effects that usually do not require medical attention (report to your doctor or health care professional if they continue or are bothersome): -headache -joint pain -muscle cramps or muscle pain -nausea -tiredness This list may not describe all possible side effects. Call your doctor for medical advice about side effects. You may report side effects to FDA at 1-800-FDA-1088. Where should I keep my medicine? This drug is given in a hospital or clinic and will not be stored at home. NOTE: This sheet is a summary. It may not cover all possible information. If you have questions about this medicine, talk to your doctor, pharmacist, or health care provider.  2019 Elsevier/Gold Standard (2017-08-09 13:04:32)

## 2018-09-01 ENCOUNTER — Telehealth: Payer: Self-pay | Admitting: Emergency Medicine

## 2018-09-01 NOTE — Telephone Encounter (Signed)
First time chemo follow up call.  Pt denies any N/V/D or fever/chills or CP/SOB.  States that based on her Mychart she was no-showed for her appt with MD [name] on 12/19 however pt was present for her visit.  Will alert MD [name]'s nurse about this issue.  Pt also reports intermittent swelling of her R side that improves with rest/elevation.  Reports taking HCTZ for BP, denies dizziness or weakness.  Pt advised to contact [facility] for further evaluation on 12/24 if she feels the swelling has worsened or if she has more concerns, however since she feels ok at this time she will be seen by PA Lucianne Lei in infusion on 12/26 to evaluate her swelling.  Verbalized understanding and agreement, no more issues at this time.

## 2018-09-01 NOTE — Progress Notes (Signed)
Confirmed rituximab dose/frequency w/ MD on 09/01/18: she will get 375 mg/m2 weekly.   Demetrius Charity, PharmD, [facility] Pharmacist Pharmacy Phone: [phone] 09/01/2018

## 2018-09-04 ENCOUNTER — Inpatient Hospital Stay: Payer: Medicare Other

## 2018-09-04 ENCOUNTER — Other Ambulatory Visit: Payer: Self-pay | Admitting: Hematology

## 2018-09-04 VITALS — BP 113/57 | HR 66 | Temp 97.8°F | Resp 14

## 2018-09-04 DIAGNOSIS — C9110 Chronic lymphocytic leukemia of B-cell type not having achieved remission: Secondary | ICD-10-CM

## 2018-09-04 DIAGNOSIS — Z5112 Encounter for antineoplastic immunotherapy: Secondary | ICD-10-CM | POA: Diagnosis not present

## 2018-09-04 LAB — COMPREHENSIVE METABOLIC PANEL
ALT: 8 U/L (ref 0–44)
AST: 12 U/L — AB (ref 15–41)
Albumin: 3.4 g/dL — ABNORMAL LOW (ref 3.5–5.0)
Alkaline Phosphatase: 74 U/L (ref 38–126)
Anion gap: 9 (ref 5–15)
BUN: 11 mg/dL (ref 8–23)
CO2: 20 mmol/L — ABNORMAL LOW (ref 22–32)
CREATININE: 0.95 mg/dL (ref 0.44–1.00)
Calcium: 8.6 mg/dL — ABNORMAL LOW (ref 8.9–10.3)
Chloride: 109 mmol/L (ref 98–111)
GFR calc Af Amer: >60 mL/min (ref >60)
GFR calc non Af Amer: >60 mL/min (ref >60)
GLUCOSE: 107 mg/dL — AB (ref 70–99)
Potassium: 4 mmol/L (ref 3.5–5.1)
Sodium: 138 mmol/L (ref 135–145)
Total Bilirubin: 0.5 mg/dL (ref 0.3–1.2)
Total Protein: 5.6 g/dL — ABNORMAL LOW (ref 6.5–8.1)

## 2018-09-04 LAB — CBC WITH DIFFERENTIAL/PLATELET
Abs Immature Granulocytes: 0 10*3/uL (ref 0.00–0.07)
BASOS ABS: 0 10*3/uL (ref 0.0–0.1)
Band Neutrophils: 1 %
Basophils Relative: 0 %
Eosinophils Absolute: 0.7 10*3/uL — ABNORMAL HIGH (ref 0.0–0.5)
Eosinophils Relative: 4 %
HCT: 42.2 % (ref 36.0–46.0)
Hemoglobin: 13.4 g/dL (ref 12.0–15.0)
Lymphocytes Relative: 71 %
Lymphs Abs: 12.8 10*3/uL — ABNORMAL HIGH (ref 0.7–4.0)
MCH: 28.3 pg (ref 26.0–34.0)
MCHC: 31.8 g/dL (ref 30.0–36.0)
MCV: 89 fL (ref 80.0–100.0)
Monocytes Absolute: 0.9 10*3/uL (ref 0.1–1.0)
Monocytes Relative: 5 %
Neutro Abs: 3.6 10*3/uL (ref 1.7–17.7)
Neutrophils Relative %: 19 %
Platelets: 140 10*3/uL — ABNORMAL LOW (ref 150–400)
RBC: 4.74 MIL/uL (ref 3.87–5.11)
RDW: 15.2 % (ref 11.5–15.5)
WBC: 18 10*3/uL — ABNORMAL HIGH (ref 4.0–10.5)
nRBC: 0 % (ref 0.0–0.2)

## 2018-09-04 LAB — LACTATE DEHYDROGENASE: LDH: 263 U/L — ABNORMAL HIGH (ref 98–192)

## 2018-09-04 MED ORDER — DIPHENHYDRAMINE HCL 25 MG PO CAPS
ORAL_CAPSULE | ORAL | Status: AC
  Filled 2018-09-04: qty 1

## 2018-09-04 MED ORDER — SODIUM CHLORIDE 0.9 % IV SOLN
375.0000 mg/m2 | Freq: Once | INTRAVENOUS | Status: AC
  Administered 2018-09-04: 800 mg via INTRAVENOUS
  Filled 2018-09-04: qty 50

## 2018-09-04 MED ORDER — SODIUM CHLORIDE 0.9 % IV SOLN
Freq: Once | INTRAVENOUS | Status: DC
  Filled 2018-09-04: qty 250

## 2018-09-04 MED ORDER — SODIUM CHLORIDE 0.9 % IV SOLN
20.0000 mg | Freq: Once | INTRAVENOUS | Status: AC
  Administered 2018-09-04: 20 mg via INTRAVENOUS
  Filled 2018-09-04: qty 2

## 2018-09-04 MED ORDER — ACETAMINOPHEN 325 MG PO TABS
650.0000 mg | ORAL_TABLET | Freq: Once | ORAL | Status: AC
  Administered 2018-09-04: 650 mg via ORAL

## 2018-09-04 MED ORDER — DIPHENHYDRAMINE HCL 25 MG PO CAPS
25.0000 mg | ORAL_CAPSULE | Freq: Once | ORAL | Status: AC
  Administered 2018-09-04: 25 mg via ORAL

## 2018-09-04 MED ORDER — ACETAMINOPHEN 325 MG PO TABS
ORAL_TABLET | ORAL | Status: AC
  Filled 2018-09-04: qty 2

## 2018-09-04 NOTE — Patient Instructions (Signed)
[facility] Discharge Instructions for Patients Receiving Chemotherapy  Today you received the following chemotherapy agents Rituximab.  To help prevent nausea and vomiting after your treatment, we encourage you to take your nausea medication as directed.  If you develop nausea and vomiting that is not controlled by your nausea medication, call the clinic.   BELOW ARE SYMPTOMS THAT SHOULD BE REPORTED IMMEDIATELY:  *FEVER GREATER THAN 100.5 F  *CHILLS WITH OR WITHOUT FEVER  NAUSEA AND VOMITING THAT IS NOT CONTROLLED WITH YOUR NAUSEA MEDICATION  *UNUSUAL SHORTNESS OF BREATH  *UNUSUAL BRUISING OR BLEEDING  TENDERNESS IN MOUTH AND THROAT WITH OR WITHOUT PRESENCE OF ULCERS  *URINARY PROBLEMS  *BOWEL PROBLEMS  UNUSUAL RASH Items with * indicate a potential emergency and should be followed up as soon as possible.  Feel free to call the clinic should you have any questions or concerns. The clinic phone number is [phone].  Please show the [facility] at check-in to the Emergency Department and triage nurse.  Rituximab injection What is this medicine? RITUXIMAB (ri TUX i mab) is a monoclonal antibody. It is used to treat certain types of cancer like non-Hodgkin lymphoma and chronic lymphocytic leukemia. It is also used to treat rheumatoid arthritis, granulomatosis with polyangiitis (or Wegener's granulomatosis), microscopic polyangiitis, and pemphigus vulgaris. This medicine may be used for other purposes; ask your health care provider or pharmacist if you have questions. COMMON BRAND NAME(S): Rituxan What should I tell my health care provider before I take this medicine? They need to know if you have any of these conditions: -heart disease -infection (especially a virus infection such as hepatitis B, chickenpox, cold sores, or herpes) -immune system problems -irregular heartbeat -kidney disease -low blood counts, like low white cell, platelet, or red cell  counts -lung or breathing disease, like asthma -recently received or scheduled to receive a vaccine -an unusual or allergic reaction to rituximab, other medicines, foods, dyes, or preservatives -pregnant or trying to get pregnant -breast-feeding How should I use this medicine? This medicine is for infusion into a vein. It is administered in a hospital or clinic by a specially trained health care professional. A special MedGuide will be given to you by the pharmacist with each prescription and refill. Be sure to read this information carefully each time. Talk to your pediatrician regarding the use of this medicine in children. This medicine is not approved for use in children. Overdosage: If you think you have taken too much of this medicine contact a poison control center or emergency room at once. NOTE: This medicine is only for you. Do not share this medicine with others. What if I miss a dose? It is important not to miss a dose. Call your doctor or health care professional if you are unable to keep an appointment. What may interact with this medicine? -cisplatin -live virus vaccines This list may not describe all possible interactions. Give your health care provider a list of all the medicines, herbs, non-prescription drugs, or dietary supplements you use. Also tell them if you smoke, drink alcohol, or use illegal drugs. Some items may interact with your medicine. What should I watch for while using this medicine? Your condition will be monitored carefully while you are receiving this medicine. You may need blood work done while you are taking this medicine. This medicine can cause serious allergic reactions. To reduce your risk you may need to take medicine before treatment with this medicine. Take your medicine as directed. In  some patients, this medicine may cause a serious brain infection that may cause death. If you have any problems seeing, thinking, speaking, walking, or standing, tell  your healthcare professional right away. If you cannot reach your healthcare professional, urgently seek other source of medical care. Call your doctor or health care professional for advice if you get a fever, chills or sore throat, or other symptoms of a cold or flu. Do not treat yourself. This drug decreases your body's ability to fight infections. Try to avoid being around people who are sick. Do not become pregnant while taking this medicine or for 12 months after stopping it. Women should inform their doctor if they wish to become pregnant or think they might be pregnant. There is a potential for serious side effects to an unborn child. Talk to your health care professional or pharmacist for more information. Do not breast-feed an infant while taking this medicine or for 6 months after stopping it. What side effects may I notice from receiving this medicine? Side effects that you should report to your doctor or health care professional as soon as possible: -allergic reactions like skin rash, itching or hives; swelling of the face, lips, or tongue -breathing problems -chest pain -changes in vision -diarrhea -headache with fever, neck stiffness, sensitivity to light, nausea, or confusion -fast, irregular heartbeat -loss of memory -low blood counts - this medicine may decrease the number of white blood cells, red blood cells and platelets. You may be at increased risk for infections and bleeding. -mouth sores -problems with balance, talking, or walking -redness, blistering, peeling or loosening of the skin, including inside the mouth -signs of infection - fever or chills, cough, sore throat, pain or difficulty passing urine -signs and symptoms of kidney injury like trouble passing urine or change in the amount of urine -signs and symptoms of liver injury like dark yellow or brown urine; general ill feeling or flu-like symptoms; light-colored stools; loss of appetite; nausea; right upper belly  pain; unusually weak or tired; yellowing of the eyes or skin -signs and symptoms of low blood pressure like dizziness; feeling faint or lightheaded, falls; unusually weak or tired -stomach pain -swelling of the ankles, feet, hands -unusual bleeding or bruising -vomiting Side effects that usually do not require medical attention (report to your doctor or health care professional if they continue or are bothersome): -headache -joint pain -muscle cramps or muscle pain -nausea -tiredness This list may not describe all possible side effects. Call your doctor for medical advice about side effects. You may report side effects to FDA at 1-800-FDA-1088. Where should I keep my medicine? This drug is given in a hospital or clinic and will not be stored at home. NOTE: This sheet is a summary. It may not cover all possible information. If you have questions about this medicine, talk to your doctor, pharmacist, or health care provider.  2019 Elsevier/Gold Standard (2017-08-09 13:04:32)

## 2018-09-05 LAB — IGG, IGA, IGM
IGA: 85 mg/dL — AB (ref 87–352)
IgG (Immunoglobin G), Serum: 342 mg/dL — ABNORMAL LOW (ref 700–1600)
IgM (Immunoglobulin M), Srm: 23 mg/dL — ABNORMAL LOW (ref 26–217)

## 2018-09-11 ENCOUNTER — Inpatient Hospital Stay: Payer: Medicare Other

## 2018-09-11 ENCOUNTER — Ambulatory Visit (HOSPITAL_COMMUNITY)
Admission: RE | Admit: 2018-09-11 | Discharge: 2018-09-11 | Disposition: A | Discharge status: Home / Self Care | Payer: Medicare Other | Source: Ambulatory Visit | Attending: Oncology | Admitting: Oncology

## 2018-09-11 ENCOUNTER — Inpatient Hospital Stay (HOSPITAL_BASED_OUTPATIENT_CLINIC_OR_DEPARTMENT_OTHER): Payer: Medicare Other | Admitting: Oncology

## 2018-09-11 ENCOUNTER — Telehealth: Payer: Self-pay | Admitting: Oncology

## 2018-09-11 ENCOUNTER — Inpatient Hospital Stay: Payer: Medicare Other | Attending: Oncology

## 2018-09-11 VITALS — BP 115/48 | HR 70 | Temp 98.6°F | Resp 17

## 2018-09-11 DIAGNOSIS — M7989 Other specified soft tissue disorders: Secondary | ICD-10-CM

## 2018-09-11 DIAGNOSIS — C9110 Chronic lymphocytic leukemia of B-cell type not having achieved remission: Secondary | ICD-10-CM | POA: Insufficient documentation

## 2018-09-11 DIAGNOSIS — D72829 Elevated white blood cell count, unspecified: Secondary | ICD-10-CM | POA: Diagnosis not present

## 2018-09-11 DIAGNOSIS — D72820 Lymphocytosis (symptomatic): Secondary | ICD-10-CM | POA: Insufficient documentation

## 2018-09-11 DIAGNOSIS — Z79899 Other long term (current) drug therapy: Secondary | ICD-10-CM | POA: Diagnosis not present

## 2018-09-11 DIAGNOSIS — Z5112 Encounter for antineoplastic immunotherapy: Secondary | ICD-10-CM | POA: Insufficient documentation

## 2018-09-11 LAB — CBC WITH DIFFERENTIAL/PLATELET
ABS IMMATURE GRANULOCYTES: 0 10*3/uL (ref 0.00–0.07)
Band Neutrophils: 1 %
Basophils Absolute: 0.2 10*3/uL — ABNORMAL HIGH (ref 0.0–0.1)
Basophils Relative: 1 %
Eosinophils Absolute: 0.2 10*3/uL (ref 0.0–0.5)
Eosinophils Relative: 1 %
HCT: 44.6 % (ref 36.0–46.0)
Hemoglobin: 13.9 g/dL (ref 12.0–15.0)
Lymphocytes Relative: 78 %
Lymphs Abs: 17.9 10*3/uL — ABNORMAL HIGH (ref 0.7–4.0)
MCH: 27.8 pg (ref 26.0–34.0)
MCHC: 31.2 g/dL (ref 30.0–36.0)
MCV: 89.2 fL (ref 80.0–100.0)
Monocytes Absolute: 0.5 10*3/uL (ref 0.1–1.0)
Monocytes Relative: 2 %
NRBC: 0 % (ref 0.0–0.2)
Neutro Abs: 4.1 10*3/uL (ref 1.7–17.7)
Neutrophils Relative %: 17 %
Platelets: 122 10*3/uL — ABNORMAL LOW (ref 150–400)
RBC: 5 MIL/uL (ref 3.87–5.11)
RDW: 15 % (ref 11.5–15.5)
WBC: 22.9 10*3/uL — AB (ref 4.0–10.5)

## 2018-09-11 LAB — COMPREHENSIVE METABOLIC PANEL
ALT: 10 U/L (ref 0–44)
AST: 13 U/L — ABNORMAL LOW (ref 15–41)
Albumin: 3.8 g/dL (ref 3.5–5.0)
Alkaline Phosphatase: 90 U/L (ref 38–126)
Anion gap: 11 (ref 5–15)
BILIRUBIN TOTAL: 0.9 mg/dL (ref 0.3–1.2)
BUN: 18 mg/dL (ref 8–23)
CO2: 20 mmol/L — ABNORMAL LOW (ref 22–32)
Calcium: 9 mg/dL (ref 8.9–10.3)
Chloride: 107 mmol/L (ref 98–111)
Creatinine, Ser: 0.95 mg/dL (ref 0.44–1.00)
GFR calc Af Amer: >60 mL/min (ref >60)
Glucose, Bld: 106 mg/dL — ABNORMAL HIGH (ref 70–99)
Potassium: 4.2 mmol/L (ref 3.5–5.1)
Sodium: 138 mmol/L (ref 135–145)
Total Protein: 6 g/dL — ABNORMAL LOW (ref 6.5–8.1)

## 2018-09-11 LAB — LACTATE DEHYDROGENASE: LDH: 362 U/L — ABNORMAL HIGH (ref 98–192)

## 2018-09-11 MED ORDER — SODIUM CHLORIDE 0.9 % IV SOLN
375.0000 mg/m2 | Freq: Once | INTRAVENOUS | Status: AC
  Administered 2018-09-11: 800 mg via INTRAVENOUS
  Filled 2018-09-11: qty 30

## 2018-09-11 MED ORDER — ACETAMINOPHEN 325 MG PO TABS
650.0000 mg | ORAL_TABLET | Freq: Once | ORAL | Status: AC
  Administered 2018-09-11: 650 mg via ORAL

## 2018-09-11 MED ORDER — SODIUM CHLORIDE 0.9 % IV SOLN
Freq: Once | INTRAVENOUS | Status: DC
  Filled 2018-09-11: qty 250

## 2018-09-11 MED ORDER — SODIUM CHLORIDE 0.9 % IV SOLN
20.0000 mg | Freq: Once | INTRAVENOUS | Status: AC
  Administered 2018-09-11: 20 mg via INTRAVENOUS
  Filled 2018-09-11: qty 2

## 2018-09-11 MED ORDER — DIPHENHYDRAMINE HCL 25 MG PO CAPS
25.0000 mg | ORAL_CAPSULE | Freq: Once | ORAL | Status: AC
  Administered 2018-09-11: 25 mg via ORAL

## 2018-09-11 MED ORDER — ACETAMINOPHEN 325 MG PO TABS
ORAL_TABLET | ORAL | Status: AC
  Filled 2018-09-11: qty 2

## 2018-09-11 MED ORDER — DIPHENHYDRAMINE HCL 25 MG PO CAPS
ORAL_CAPSULE | ORAL | Status: AC
  Filled 2018-09-11: qty 1

## 2018-09-11 NOTE — Addendum Note (Signed)
Addended by: Margaret Pyle on: 09/11/2018 03:06 PM   Modules accepted: Orders

## 2018-09-11 NOTE — Patient Instructions (Signed)
[facility] Discharge Instructions for Patients Receiving Chemotherapy  Today you received the following chemotherapy agents Rituximab.  To help prevent nausea and vomiting after your treatment, we encourage you to take your nausea medication as directed.  If you develop nausea and vomiting that is not controlled by your nausea medication, call the clinic.   BELOW ARE SYMPTOMS THAT SHOULD BE REPORTED IMMEDIATELY:  *FEVER GREATER THAN 100.5 F  *CHILLS WITH OR WITHOUT FEVER  NAUSEA AND VOMITING THAT IS NOT CONTROLLED WITH YOUR NAUSEA MEDICATION  *UNUSUAL SHORTNESS OF BREATH  *UNUSUAL BRUISING OR BLEEDING  TENDERNESS IN MOUTH AND THROAT WITH OR WITHOUT PRESENCE OF ULCERS  *URINARY PROBLEMS  *BOWEL PROBLEMS  UNUSUAL RASH Items with * indicate a potential emergency and should be followed up as soon as possible.  Feel free to call the clinic should you have any questions or concerns. The clinic phone number is [phone].  Please show the [place] at check-in to the Emergency Department and triage nurse.  Rituximab injection What is this medicine? RITUXIMAB (ri TUX i mab) is a monoclonal antibody. It is used to treat certain types of cancer like non-Hodgkin lymphoma and chronic lymphocytic leukemia. It is also used to treat rheumatoid arthritis, granulomatosis with polyangiitis (or Wegener's granulomatosis), microscopic polyangiitis, and pemphigus vulgaris. This medicine may be used for other purposes; ask your health care provider or pharmacist if you have questions. COMMON BRAND NAME(S): Rituxan What should I tell my health care provider before I take this medicine? They need to know if you have any of these conditions: -heart disease -infection (especially a virus infection such as hepatitis B, chickenpox, cold sores, or herpes) -immune system problems -irregular heartbeat -kidney disease -low blood counts, like low white cell, platelet, or red cell  counts -lung or breathing disease, like asthma -recently received or scheduled to receive a vaccine -an unusual or allergic reaction to rituximab, other medicines, foods, dyes, or preservatives -pregnant or trying to get pregnant -breast-feeding How should I use this medicine? This medicine is for infusion into a vein. It is administered in a hospital or clinic by a specially trained health care professional. A special MedGuide will be given to you by the pharmacist with each prescription and refill. Be sure to read this information carefully each time. Talk to your pediatrician regarding the use of this medicine in children. This medicine is not approved for use in children. Overdosage: If you think you have taken too much of this medicine contact a poison control center or emergency room at once. NOTE: This medicine is only for you. Do not share this medicine with others. What if I miss a dose? It is important not to miss a dose. Call your doctor or health care professional if you are unable to keep an appointment. What may interact with this medicine? -cisplatin -live virus vaccines This list may not describe all possible interactions. Give your health care provider a list of all the medicines, herbs, non-prescription drugs, or dietary supplements you use. Also tell them if you smoke, drink alcohol, or use illegal drugs. Some items may interact with your medicine. What should I watch for while using this medicine? Your condition will be monitored carefully while you are receiving this medicine. You may need blood work done while you are taking this medicine. This medicine can cause serious allergic reactions. To reduce your risk you may need to take medicine before treatment with this medicine. Take your medicine as directed. In  some patients, this medicine may cause a serious brain infection that may cause death. If you have any problems seeing, thinking, speaking, walking, or standing, tell  your healthcare professional right away. If you cannot reach your healthcare professional, urgently seek other source of medical care. Call your doctor or health care professional for advice if you get a fever, chills or sore throat, or other symptoms of a cold or flu. Do not treat yourself. This drug decreases your body's ability to fight infections. Try to avoid being around people who are sick. Do not become pregnant while taking this medicine or for 12 months after stopping it. Women should inform their doctor if they wish to become pregnant or think they might be pregnant. There is a potential for serious side effects to an unborn child. Talk to your health care professional or pharmacist for more information. Do not breast-feed an infant while taking this medicine or for 6 months after stopping it. What side effects may I notice from receiving this medicine? Side effects that you should report to your doctor or health care professional as soon as possible: -allergic reactions like skin rash, itching or hives; swelling of the face, lips, or tongue -breathing problems -chest pain -changes in vision -diarrhea -headache with fever, neck stiffness, sensitivity to light, nausea, or confusion -fast, irregular heartbeat -loss of memory -low blood counts - this medicine may decrease the number of white blood cells, red blood cells and platelets. You may be at increased risk for infections and bleeding. -mouth sores -problems with balance, talking, or walking -redness, blistering, peeling or loosening of the skin, including inside the mouth -signs of infection - fever or chills, cough, sore throat, pain or difficulty passing urine -signs and symptoms of kidney injury like trouble passing urine or change in the amount of urine -signs and symptoms of liver injury like dark yellow or brown urine; general ill feeling or flu-like symptoms; light-colored stools; loss of appetite; nausea; right upper belly  pain; unusually weak or tired; yellowing of the eyes or skin -signs and symptoms of low blood pressure like dizziness; feeling faint or lightheaded, falls; unusually weak or tired -stomach pain -swelling of the ankles, feet, hands -unusual bleeding or bruising -vomiting Side effects that usually do not require medical attention (report to your doctor or health care professional if they continue or are bothersome): -headache -joint pain -muscle cramps or muscle pain -nausea -tiredness This list may not describe all possible side effects. Call your doctor for medical advice about side effects. You may report side effects to FDA at 1-800-FDA-1088. Where should I keep my medicine? This drug is given in a hospital or clinic and will not be stored at home. NOTE: This sheet is a summary. It may not cover all possible information. If you have questions about this medicine, talk to your doctor, pharmacist, or health care provider.  2019 Elsevier/Gold Standard (2017-08-09 13:04:32)

## 2018-09-11 NOTE — Progress Notes (Signed)
[facility]  [phone] [phone]   ID: Maria [name]   DOB: [birth year]  MR#: [id number]  WER#:[id number]  Patient Care Team: Marton Redwood, MD as PCP - General (Internal Medicine) [name], MD as Consulting Physician (Orthopedic Surgery) Krystol Rocco, [name] Dad, MD as Consulting Physician (Oncology)   CHIEF COMPLAINT: Small lymphocytic lymphoma   CURRENT TREATMENT: Ibrutinib, rituximab   HISTORY OF PRESENT ILLNESS: From the original intake note:  Maria [name] developed what she thought was "the flu" in December of 2002. She noted a large lymph node developing in her right anterior cervical area. 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 She was seen in infusion today.  The patient continues on Ibrutinib, which she is tolerating well.   She also receives rituximab, which she is tolerating well.  Today is the fourth weekly dose of 8 planned, after which she will start maintenance rituximab.  Of course she will need to be restaged prior to that. She reports fatigue but attributes it to her recent surgery.    REVIEW OF SYSTEMS: Maria [name] is doing well overall. She notes improvement in her constipation with miralax. She has swelling in her right lower leg and states it may be related to her CLL. She will be taking a trip in February, starting on the 14th. The patient denies typical 'B' symptoms of fever, diaphoresis, weight loss, or fatigue. The patient denies unusual headaches, visual changes, nausea, vomiting, or dizziness. There has been no unusual cough, phlegm production, or pleurisy. This been no change in bowel or bladder habits. The patient denies bleeding or rash. A detailed review of systems was otherwise noncontributory.     PAST MEDICAL HISTORY: Past Medical History:  Diagnosis Date  . Allergy   . Arthritis   . Asthma   . Cancer [facility])    chronic lymphacytic leukemia  . CLL (chronic lymphocytic leukemia) ([place])   . Diabetes mellitus    PMH  . Headache    migraine  . Hyperlipidemia   . Hypertension   . Hypothyroidism   . Lymphoma ([place])   . Thyroid disease    hypothyroidism  . Wears glasses     PAST SURGICAL HISTORY: Past Surgical History:  Procedure Laterality Date  . ABDOMINAL HYSTERECTOMY    . AXILLARY LYMPH NODE BIOPSY Right 08/21/2018   Procedure: RIGHT AXILLARY LYMPH NODE BIOPSY ERAS PATHWAY;  Surgeon: [name], MD;  Location: [facility];  Service: General;  Laterality: Right;  LMA  . BREAST EXCISIONAL BIOPSY Right   . COLONOSCOPY    . LYMPH NODE DISSECTION    . STRABISMUS SURGERY    . TONSILLECTOMY    . TOTAL KNEE ARTHROPLASTY Right 12/03/2016   Procedure: RIGHT TOTAL KNEE ARTHROPLASTY;  Surgeon: [name], MD;  Location: [facility];  Service: Orthopedics;  Laterality: Right;    FAMILY HISTORY Family History  Problem Relation Age of Onset  . Liver cancer Mother   . Heart disease Father   . Breast cancer Sister 24  . Colon cancer Neg Hx   . Pancreatic cancer Neg Hx   . Rectal cancer Neg Hx   . Stomach cancer Neg Hx    The patient's father died at the age of 96 from a myocardial infarction. The patient's mother died at the age  of 68 from primary liver carcinoma. The patient had no brothers. Her one sister, [name], has a history of breast cancer  GYNECOLOGIC HISTORY: Menarche at around age 79. The patient is GX P0. 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 She lives by herself with her cats [place] and [place]. She attends a local [employer]: Not in place  HEALTH  MAINTENANCE: Social History   Tobacco Use  . Smoking status: Never Smoker  . Smokeless tobacco: Never Used  Substance Use Topics  . Alcohol use: Yes    Comment: occasional alcohol intake; once or twice monthly  . Drug use: No     Colonoscopy: 06/01/2014/[facility]  PAP:  Bone density:  Lipid panel:  228/114/50/155/ratio 4.6  On 10/01/2016  Allergies  Allergen Reactions  . Iohexol Swelling and Rash  . Contrast Media  [Iodinated Diagnostic Agents]   . Lactose Intolerance (Gi)     Diarrhea, gas bloating  . Lactulose Diarrhea and Other (See Comments)    gas bloating  . Cephalexin Rash    Current Outpatient Medications  Medication Sig Dispense Refill  . hydrochlorothiazide (HYDRODIURIL) 12.5 MG tablet Take 12.5 mg by mouth daily.     Marland Kitchen HYDROcodone-acetaminophen (NORCO) 5-325 MG tablet Take 1-2 tablets by mouth every 6 (six) hours as needed for moderate pain or severe pain. 20 tablet 0  . Ibrutinib (IMBRUVICA) 420 MG TABS Take 1 tablet by mouth at bedtime. (Patient taking differently: Take 420 mg by mouth at bedtime. ) 28 tablet 2  . rosuvastatin (CRESTOR) 10 MG tablet Take 10 mg by mouth at bedtime.  11  . SYNTHROID 88 MCG tablet Take 88 mcg by mouth daily before breakfast.     . vitamin B-12 (CYANOCOBALAMIN) 500 MCG tablet Take 500 mcg by mouth daily.    . zoledronic acid (RECLAST) 5 MG/100ML SOLN injection Inject 5 mg into the vein once. Once a year     No current facility-administered medications for this visit.    Facility-Administered Medications Ordered in Other Visits  Medication Dose Route Frequency Provider Last Rate Last Dose  . 0.9 %  sodium chloride infusion   Intravenous Once Daesean Lazarz, [name] Dad, MD      . dexamethasone (DECADRON) 20 mg in sodium chloride 0.9 % 50 mL IVPB  20 mg Intravenous Once Leeann Bady, [name] Dad, MD 208 mL/hr at 09/11/18 0928 20 mg at 09/11/18 0928  . riTUXimab (RITUXAN) 800 mg in sodium chloride 0.9 % 250 mL (2.4242 mg/mL) infusion  375 mg/m2  (Treatment Plan Recorded) Intravenous Once Richa Shor, [name] Dad, MD        OBJECTIVE: Middle-aged white woman examined today in the infusion area  There were no vitals filed for this visit.   There is no height or weight on file to calculate BMI.    ECOG FS: 1 For today's vitals please see the treatment area data sheet  Sclerae unicteric, EOMs intact Oropharynx clear and moist Persistent bilateral cervical adenopathy which is slightly softer and smaller Lungs no rales or rhonchi Heart regular rate and rhythm Abd soft, nontender, positive bowel sounds MSK no focal spinal tenderness, no upper extremity lymphedema Neuro: nonfocal, well oriented, appropriate affect Breasts: Deferred   LAB RESULTS:  Lab Results  Component Value Date   WBC 22.9 (H) 09/11/2018   NEUTROABS PENDING 09/11/2018  HGB 13.9 09/11/2018   HCT 44.6 09/11/2018   MCV 89.2 09/11/2018   PLT 122 (L) 09/11/2018      Chemistry      Component Value Date/Time   NA 138 09/11/2018 0813   NA 139 08/05/2017 1353   K 4.2 09/11/2018 0813   K 3.7 08/05/2017 1353   CL 107 09/11/2018 0813   CO2 20 (L) 09/11/2018 0813   CO2 27 08/05/2017 1353   BUN 18 09/11/2018 0813   BUN 17.3 08/05/2017 1353   CREATININE 0.95 09/11/2018 0813   CREATININE 1.0 08/05/2017 1353   GLU 101 (H) 10/18/2009 1542      Component Value Date/Time   CALCIUM 9.0 09/11/2018 0813   CALCIUM 9.2 08/05/2017 1353   ALKPHOS 90 09/11/2018 0813   ALKPHOS 76 08/05/2017 1353   AST 13 (L) 09/11/2018 0813   AST 18 08/05/2017 1353   ALT 10 09/11/2018 0813   ALT 14 08/05/2017 1353   BILITOT 0.9 09/11/2018 0813   BILITOT 0.59 08/05/2017 1353       No results found for: LABCA2  No components found for: LABCA125  No results for input(s): INR in the last 168 hours.  Urinalysis    Component Value Date/Time   COLORURINE LT. YELLOW 01/24/2010 1017   APPEARANCEUR CLEAR 01/24/2010 1017   LABSPEC <=1.005 01/24/2010 1017   PHURINE 5.5 01/24/2010 1017    GLUCOSEU NEGATIVE 01/24/2010 [place] 01/24/2010 1017   KETONESUR NEGATIVE 01/24/2010 1017   UROBILINOGEN 0.2 01/24/2010 1017   NITRITE NEGATIVE 01/24/2010 [place] 01/24/2010 1017    STUDIES: No results found.  ASSESSMENT: 68 y.o. [name], Maria [name] with a history of chronic lymphoid leukemia initially diagnosed in January 2003,   (1) treated in 2005 with cyclophosphamide, vincristine, prednisone and Rituxan  (2) treated next in 2008 with cyclophosphamide, fludarabine and rituximab, last dose November of 2008  (3) status post right axillary lymph node biopsy 07/18/2012 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 There was no CD10 or cyclin D1 positivity identified  (4) started ibrutinib at 420 mg/ day 08/09/2014  (a) PET scan 07/23/2018 shows progressive adenopathy diffusely  (b) right axillary lymph node core biopsy shows features concerning but not definitive for Darron Doom transformation  (5) evidence of disease progression November 2019  (a) PET scan 07/23/2018 shows extensive progressive adenopathy.  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  We are continuing with weekly treatments and continuing with ibrutinib.  She has had a slight response, but some of that could also be due to the premeds with steroids.  She is having right lower extremity swelling.  In the past this has been related to adenopathy but of course it could also be a clot.  I have written her to have Doppler ultrasonography today when she is done with the rituximab.  Her absolute lymphocyte count has risen.  This could be due to possibly the ibrutinib or it could be further evidence of  disease progression.  At this point what I would like to do is continue the current treatment and reassess in about 3 weeks.  If we have not made significant progress we will consider other alternatives including bendamustine  She knows to call for any other  problems that may develop before that visit.  Ellyse Rotolo, [name] Dad, MD  09/11/18 9:31 AM Medical Oncology and Hematology [facility] [address] [place], [place] [address] Tel. [phone]    Fax. [phone]    I, Wilburn Mylar, am acting as scribe for Dr. [name] Dad. Deb Loudin. I, Lurline Del MD, have reviewed the above documentation for accuracy and completeness, and I agree with the above.

## 2018-09-11 NOTE — Progress Notes (Addendum)
Right lower extremity venous duplex completed- Preliminary results called to Dr. [name] Dad office. Full prelminary may be found in chart review CV Proc. [place],RVS 09/11/2018,1:36 PM

## 2018-09-11 NOTE — Telephone Encounter (Signed)
Added appointments for 1/23 per 1/2 schedule message. Spoke with patient - other appointments remain the same. Patient already on schedule for doppler today and already aware.

## 2018-09-12 LAB — IGG, IGA, IGM
IgA: 89 mg/dL (ref 87–352)
IgG (Immunoglobin G), Serum: 386 mg/dL — ABNORMAL LOW (ref 700–1600)
IgM (Immunoglobulin M), Srm: 17 mg/dL — ABNORMAL LOW (ref 26–217)

## 2018-09-17 ENCOUNTER — Other Ambulatory Visit: Payer: Self-pay | Admitting: Oncology

## 2018-09-17 DIAGNOSIS — C9110 Chronic lymphocytic leukemia of B-cell type not having achieved remission: Secondary | ICD-10-CM

## 2018-09-18 ENCOUNTER — Inpatient Hospital Stay: Payer: Medicare Other

## 2018-09-18 ENCOUNTER — Other Ambulatory Visit: Payer: Self-pay | Admitting: Oncology

## 2018-09-18 VITALS — BP 113/49 | HR 68 | Temp 98.4°F | Resp 17

## 2018-09-18 DIAGNOSIS — C9110 Chronic lymphocytic leukemia of B-cell type not having achieved remission: Secondary | ICD-10-CM

## 2018-09-18 DIAGNOSIS — Z5112 Encounter for antineoplastic immunotherapy: Secondary | ICD-10-CM | POA: Diagnosis not present

## 2018-09-18 LAB — COMPREHENSIVE METABOLIC PANEL
ALBUMIN: 3.6 g/dL (ref 3.5–5.0)
ALT: 13 U/L (ref 0–44)
AST: 17 U/L (ref 15–41)
Alkaline Phosphatase: 86 U/L (ref 38–126)
Anion gap: 10 (ref 5–15)
BUN: 15 mg/dL (ref 8–23)
CHLORIDE: 107 mmol/L (ref 98–111)
CO2: 22 mmol/L (ref 22–32)
Calcium: 8.7 mg/dL — ABNORMAL LOW (ref 8.9–10.3)
Creatinine, Ser: 0.86 mg/dL (ref 0.44–1.00)
GFR calc Af Amer: >60 mL/min (ref >60)
GFR calc non Af Amer: >60 mL/min (ref >60)
Glucose, Bld: 112 mg/dL — ABNORMAL HIGH (ref 70–99)
Potassium: 3.9 mmol/L (ref 3.5–5.1)
Sodium: 139 mmol/L (ref 135–145)
Total Bilirubin: 0.7 mg/dL (ref 0.3–1.2)
Total Protein: 5.7 g/dL — ABNORMAL LOW (ref 6.5–8.1)

## 2018-09-18 LAB — CBC WITH DIFFERENTIAL/PLATELET
Abs Immature Granulocytes: 0 10*3/uL (ref 0.00–0.07)
Basophils Absolute: 0 10*3/uL (ref 0.0–0.1)
Basophils Relative: 0 %
Eosinophils Absolute: 0.2 10*3/uL (ref 0.0–0.5)
Eosinophils Relative: 1 %
HEMATOCRIT: 40.8 % (ref 36.0–46.0)
Hemoglobin: 13 g/dL (ref 12.0–15.0)
LYMPHS ABS: 16.1 10*3/uL — AB (ref 0.7–4.0)
LYMPHS PCT: 78 %
MCH: 28.1 pg (ref 26.0–34.0)
MCHC: 31.9 g/dL (ref 30.0–36.0)
MCV: 88.3 fL (ref 80.0–100.0)
Monocytes Absolute: 0.6 10*3/uL (ref 0.1–1.0)
Monocytes Relative: 3 %
NEUTROS ABS: 3.7 10*3/uL (ref 1.7–17.7)
Neutrophils Relative %: 18 %
Platelets: 110 10*3/uL — ABNORMAL LOW (ref 150–400)
RBC: 4.62 MIL/uL (ref 3.87–5.11)
RDW: 15.1 % (ref 11.5–15.5)
WBC: 20.6 10*3/uL — ABNORMAL HIGH (ref 4.0–10.5)
nRBC: 0 % (ref 0.0–0.2)

## 2018-09-18 LAB — SAVE SMEAR(SSMR), FOR PROVIDER SLIDE REVIEW

## 2018-09-18 LAB — LACTATE DEHYDROGENASE: LDH: 293 U/L — ABNORMAL HIGH (ref 98–192)

## 2018-09-18 MED ORDER — SODIUM CHLORIDE 0.9 % IV SOLN
375.0000 mg/m2 | Freq: Once | INTRAVENOUS | Status: AC
  Administered 2018-09-18: 800 mg via INTRAVENOUS
  Filled 2018-09-18: qty 50

## 2018-09-18 MED ORDER — DIPHENHYDRAMINE HCL 25 MG PO CAPS
ORAL_CAPSULE | ORAL | Status: AC
  Filled 2018-09-18: qty 1

## 2018-09-18 MED ORDER — SODIUM CHLORIDE 0.9 % IV SOLN
20.0000 mg | Freq: Once | INTRAVENOUS | Status: AC
  Administered 2018-09-18: 20 mg via INTRAVENOUS
  Filled 2018-09-18: qty 2

## 2018-09-18 MED ORDER — DIPHENHYDRAMINE HCL 25 MG PO CAPS
25.0000 mg | ORAL_CAPSULE | Freq: Once | ORAL | Status: AC
  Administered 2018-09-18: 25 mg via ORAL

## 2018-09-18 MED ORDER — ACETAMINOPHEN 325 MG PO TABS
ORAL_TABLET | ORAL | Status: AC
  Filled 2018-09-18: qty 2

## 2018-09-18 MED ORDER — SODIUM CHLORIDE 0.9 % IV SOLN
Freq: Once | INTRAVENOUS | Status: AC
  Administered 2018-09-18: 12:00:00 via INTRAVENOUS
  Filled 2018-09-18: qty 250

## 2018-09-18 MED ORDER — ACETAMINOPHEN 325 MG PO TABS
650.0000 mg | ORAL_TABLET | Freq: Once | ORAL | Status: AC
  Administered 2018-09-18: 650 mg via ORAL

## 2018-09-18 NOTE — Progress Notes (Signed)
Pt reports decreased swelling in right lower extremity after last tx. Dr Jana Hakim aware.

## 2018-09-18 NOTE — Patient Instructions (Addendum)
[facility] °Discharge Instructions for Patients Receiving Chemotherapy ° °Today you received the following chemotherapy agents: Rituximab ° °To help prevent nausea and vomiting after your treatment, we encourage you to take your nausea medication as directed.  °  °If you develop nausea and vomiting that is not controlled by your nausea medication, call the clinic.  ° °BELOW ARE SYMPTOMS THAT SHOULD BE REPORTED IMMEDIATELY: °· *FEVER GREATER THAN 100.5 F °· *CHILLS WITH OR WITHOUT FEVER °· NAUSEA AND VOMITING THAT IS NOT CONTROLLED WITH YOUR NAUSEA MEDICATION °· *UNUSUAL SHORTNESS OF BREATH °· *UNUSUAL BRUISING OR BLEEDING °· TENDERNESS IN MOUTH AND THROAT WITH OR WITHOUT PRESENCE OF ULCERS °· *URINARY PROBLEMS °· *BOWEL PROBLEMS °· UNUSUAL RASH °Items with * indicate a potential emergency and should be followed up as soon as possible. ° °Feel free to call the clinic should you have any questions or concerns. The clinic phone number is [phone]. ° °Please show the CHEMO ALERT CARD at check-in to the Emergency Department and triage nurse. ° ° °

## 2018-09-19 LAB — IGG, IGA, IGM
IGM (IMMUNOGLOBULIN M), SRM: 12 mg/dL — AB (ref 26–217)
IgA: 76 mg/dL — ABNORMAL LOW (ref 87–352)
IgG (Immunoglobin G), Serum: 346 mg/dL — ABNORMAL LOW (ref 700–1600)

## 2018-09-25 ENCOUNTER — Inpatient Hospital Stay: Payer: Medicare Other

## 2018-09-25 VITALS — BP 130/62 | HR 66 | Temp 98.2°F | Resp 16 | Ht 66.25 in | Wt 191.1 lb

## 2018-09-25 DIAGNOSIS — C9110 Chronic lymphocytic leukemia of B-cell type not having achieved remission: Secondary | ICD-10-CM

## 2018-09-25 DIAGNOSIS — Z5112 Encounter for antineoplastic immunotherapy: Secondary | ICD-10-CM | POA: Diagnosis not present

## 2018-09-25 LAB — CBC WITH DIFFERENTIAL/PLATELET
Abs Immature Granulocytes: 0 10*3/uL (ref 0.00–0.07)
BASOS ABS: 0 10*3/uL (ref 0.0–0.1)
Basophils Relative: 0 %
Eosinophils Absolute: 0 10*3/uL (ref 0.0–0.5)
Eosinophils Relative: 0 %
HCT: 41 % (ref 36.0–46.0)
Hemoglobin: 13.2 g/dL (ref 12.0–15.0)
Lymphocytes Relative: 78 %
Lymphs Abs: 19 10*3/uL — ABNORMAL HIGH (ref 0.7–4.0)
MCH: 28 pg (ref 26.0–34.0)
MCHC: 32.2 g/dL (ref 30.0–36.0)
MCV: 87 fL (ref 80.0–100.0)
Monocytes Absolute: 0.7 10*3/uL (ref 0.1–1.0)
Monocytes Relative: 3 %
NEUTROS ABS: 4.6 10*3/uL (ref 1.7–17.7)
Neutrophils Relative %: 19 %
Platelets: 123 10*3/uL — ABNORMAL LOW (ref 150–400)
RBC: 4.71 MIL/uL (ref 3.87–5.11)
RDW: 14.8 % (ref 11.5–15.5)
WBC: 24.4 10*3/uL — ABNORMAL HIGH (ref 4.0–10.5)
nRBC: 0 % (ref 0.0–0.2)

## 2018-09-25 LAB — COMPREHENSIVE METABOLIC PANEL
ALK PHOS: 103 U/L (ref 38–126)
ALT: 12 U/L (ref 0–44)
ANION GAP: 7 (ref 5–15)
AST: 15 U/L (ref 15–41)
Albumin: 3.7 g/dL (ref 3.5–5.0)
BUN: 12 mg/dL (ref 8–23)
CO2: 25 mmol/L (ref 22–32)
Calcium: 8.7 mg/dL — ABNORMAL LOW (ref 8.9–10.3)
Chloride: 107 mmol/L (ref 98–111)
Creatinine, Ser: 0.88 mg/dL (ref 0.44–1.00)
GFR calc Af Amer: >60 mL/min (ref >60)
GFR calc non Af Amer: >60 mL/min (ref >60)
Glucose, Bld: 133 mg/dL — ABNORMAL HIGH (ref 70–99)
Potassium: 3.8 mmol/L (ref 3.5–5.1)
Sodium: 139 mmol/L (ref 135–145)
Total Bilirubin: 0.8 mg/dL (ref 0.3–1.2)
Total Protein: 5.7 g/dL — ABNORMAL LOW (ref 6.5–8.1)

## 2018-09-25 LAB — LACTATE DEHYDROGENASE: LDH: 270 U/L — AB (ref 98–192)

## 2018-09-25 MED ORDER — ACETAMINOPHEN 325 MG PO TABS
650.0000 mg | ORAL_TABLET | Freq: Once | ORAL | Status: AC
  Administered 2018-09-25: 650 mg via ORAL

## 2018-09-25 MED ORDER — DIPHENHYDRAMINE HCL 25 MG PO CAPS
25.0000 mg | ORAL_CAPSULE | Freq: Once | ORAL | Status: AC
  Administered 2018-09-25: 25 mg via ORAL

## 2018-09-25 MED ORDER — DIPHENHYDRAMINE HCL 25 MG PO CAPS
ORAL_CAPSULE | ORAL | Status: AC
  Filled 2018-09-25: qty 2

## 2018-09-25 MED ORDER — SODIUM CHLORIDE 0.9 % IV SOLN
20.0000 mg | Freq: Once | INTRAVENOUS | Status: AC
  Administered 2018-09-25: 20 mg via INTRAVENOUS
  Filled 2018-09-25: qty 2

## 2018-09-25 MED ORDER — SODIUM CHLORIDE 0.9 % IV SOLN
375.0000 mg/m2 | Freq: Once | INTRAVENOUS | Status: AC
  Administered 2018-09-25: 800 mg via INTRAVENOUS
  Filled 2018-09-25: qty 50

## 2018-09-25 MED ORDER — SODIUM CHLORIDE 0.9 % IV SOLN
Freq: Once | INTRAVENOUS | Status: AC
  Administered 2018-09-25: 13:00:00 via INTRAVENOUS
  Filled 2018-09-25: qty 250

## 2018-09-25 MED ORDER — ACETAMINOPHEN 325 MG PO TABS
ORAL_TABLET | ORAL | Status: AC
  Filled 2018-09-25: qty 2

## 2018-09-25 NOTE — Patient Instructions (Signed)
[facility] Discharge Instructions for Patients Receiving Chemotherapy  Today you received the following chemotherapy agents:  Rituxan.  To help prevent nausea and vomiting after your treatment, we encourage you to take your nausea medication as directed.   If you develop nausea and vomiting that is not controlled by your nausea medication, call the clinic.   BELOW ARE SYMPTOMS THAT SHOULD BE REPORTED IMMEDIATELY:  *FEVER GREATER THAN 100.5 F  *CHILLS WITH OR WITHOUT FEVER  NAUSEA AND VOMITING THAT IS NOT CONTROLLED WITH YOUR NAUSEA MEDICATION  *UNUSUAL SHORTNESS OF BREATH  *UNUSUAL BRUISING OR BLEEDING  TENDERNESS IN MOUTH AND THROAT WITH OR WITHOUT PRESENCE OF ULCERS  *URINARY PROBLEMS  *BOWEL PROBLEMS  UNUSUAL RASH Items with * indicate a potential emergency and should be followed up as soon as possible.  Feel free to call the clinic should you have any questions or concerns. The clinic phone number is [phone].  Please show the CHEMO ALERT CARD at check-in to the Emergency Department and triage nurse.   

## 2018-09-26 LAB — IGG, IGA, IGM
IgA: 74 mg/dL — ABNORMAL LOW (ref 87–352)
IgG (Immunoglobin G), Serum: 341 mg/dL — ABNORMAL LOW (ref 700–1600)
IgM (Immunoglobulin M), Srm: 9 mg/dL — ABNORMAL LOW (ref 26–217)

## 2018-10-01 NOTE — Progress Notes (Signed)
[place]  [phone] Fax:([phone]) [phone]   ID: Maria [name]   DOB: [birth year]  MR#: [id number]  VVO#:[id number]  Patient Care Team: Marton Redwood, MD as PCP - General (Internal Medicine) [name], MD as Consulting Physician (Orthopedic Surgery) Ricardo Schubach, [name] Dad, MD as Consulting Physician (Oncology)   CHIEF COMPLAINT: Small lymphocytic lymphoma  CURRENT TREATMENT: bendamustine/ rituximab   HISTORY OF PRESENT ILLNESS: From the original intake note:  Maria [name] developed what she thought was "the flu" in December of 2002. She noted a large lymph node developing in her right anterior cervical area. 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   Her subsequent history is as detailed below    INTERVAL HISTORY: Maria [name] returns today for follow-up and treatment of her chronic lymphoid leukemia.   She continues on Ibrutinib. She doesn't believe that this treatment is working like it used to.  The patient also continues on rituximab, with a dose due today. This will be her seventh dose.   Her last restaging study was on 07/24/2018.   Since her last visit here, she has not undergone any additional studies.     REVIEW OF SYSTEMS: Maria [name] states that the last few days, she has felt fatigued, especially in the evening. Her routine has not changed. She doesn't feel like doing anything and she falls asleep. She has continued with her exercise classes three times per week. She didn't want to go yesterday because she was tired, but she made herself go anyway's. The patient denies typical 'B' symptoms of fever, diaphoresis, or weight loss. The patient denies unusual headaches, visual changes, nausea, vomiting, or dizziness. There has been no unusual cough, phlegm production, or pleurisy. This been no change in bowel or bladder habits. The patient denies bleeding or rash. A detailed  review of systems was otherwise noncontributory.    PAST MEDICAL HISTORY: Past Medical History:  Diagnosis Date  . Allergy   . Arthritis   . Asthma   . Cancer [facility])    chronic lymphacytic leukemia  . CLL (chronic lymphocytic leukemia) ([place])   . Diabetes mellitus    PMH  . Headache    migraine  . Hyperlipidemia   . Hypertension   . Hypothyroidism   . Lymphoma ([place])   . Thyroid disease    hypothyroidism  . Wears glasses     PAST SURGICAL HISTORY: Past Surgical History:  Procedure Laterality Date  . ABDOMINAL HYSTERECTOMY    . AXILLARY LYMPH NODE BIOPSY Right 08/21/2018   Procedure: RIGHT AXILLARY LYMPH NODE BIOPSY ERAS PATHWAY;  Surgeon: [name], MD;  Location: [facility];  Service: General;  Laterality: Right;  LMA  . BREAST EXCISIONAL BIOPSY Right   . COLONOSCOPY    . LYMPH NODE DISSECTION    . STRABISMUS SURGERY    . TONSILLECTOMY    . TOTAL KNEE ARTHROPLASTY Right 12/03/2016   Procedure: RIGHT TOTAL KNEE ARTHROPLASTY;  Surgeon: [name], MD;  Location: [facility];  Service: Orthopedics;  Laterality: Right;    FAMILY HISTORY Family History  Problem Relation Age of Onset  . Liver cancer Mother   . Heart disease Father   . Breast cancer Sister 46  . Colon cancer Neg Hx   . Pancreatic cancer Neg Hx   . Rectal cancer Neg Hx   . Stomach cancer Neg Hx    The patient's father died at the age of 40 from a myocardial  infarction. The patient's mother died at the age of 66 from primary liver carcinoma. The patient had no brothers. Her one sister, [name], has a history of breast cancer   GYNECOLOGIC HISTORY: Menarche at around age 29. The patient is GX P0. 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 She lives by herself with her cats [place] and [place]. She attends a [employer].    ADVANCED DIRECTIVES: Not in place  HEALTH MAINTENANCE: Social History   Tobacco Use  . Smoking status: Never Smoker  . Smokeless tobacco: Never Used  Substance Use Topics  . Alcohol use: Yes    Comment: occasional alcohol intake; once or twice monthly  . Drug use: No     Colonoscopy: 06/01/2014/[facility]  PAP:  Bone density:  Lipid panel:  228/114/50/155/ratio 4.6  On 10/01/2016  Allergies  Allergen Reactions  . Iohexol Swelling and Rash  . Contrast Media  [Iodinated Diagnostic Agents]   . Lactose Intolerance (Gi)     Diarrhea, gas bloating  . Lactulose Diarrhea and Other (See Comments)    gas bloating  . Cephalexin Rash    Current Outpatient Medications  Medication Sig Dispense Refill  . acyclovir (ZOVIRAX) 400 MG tablet Take 1 tablet (400 mg total) by mouth daily. 30 tablet 3  . allopurinol (ZYLOPRIM) 300 MG tablet Take 1 tablet (300 mg total) by mouth daily. 30 tablet 3  . dexamethasone (DECADRON) 4 MG tablet Take 2 tablets (8 mg total) by mouth daily. Start the day after bendamustine chemotherapy for 2 days. Take with food. 30 tablet 1  . hydrochlorothiazide (HYDRODIURIL) 12.5 MG tablet Take 12.5 mg by mouth daily.     [place] lidocaine-prilocaine (EMLA) cream Apply to affected area once 30 g 3  . rosuvastatin (CRESTOR) 10 MG tablet Take 10 mg by mouth at bedtime.  11  . SYNTHROID 88 MCG tablet Take 88 mcg by mouth daily before breakfast.     . vitamin B-12 (CYANOCOBALAMIN) 500 MCG tablet Take 500 mcg by mouth daily.    . zoledronic acid (RECLAST) 5 MG/100ML SOLN injection Inject 5 mg into the vein once. Once a year     No current facility-administered medications for this visit.     OBJECTIVE: Middle-aged white woman in no acute distress  Vitals:   10/02/18 0850  BP: 137/75  [name]: 70  Resp: 18  Temp: (!) 97.5 F (36.4 C)  SpO2: 99%     Body mass index is 35.02 kg/m.    ECOG FS: 1   Sclerae unicteric, pupils round and equal No cervical or  supraclavicular adenopathy; bilateral axillary adenopathy, right greater than left  lungs no rales or rhonchi Heart regular rate and rhythm Abd soft, nontender, positive bowel sounds MSK no focal spinal tenderness, no upper extremity lymphedema Neuro: nonfocal, well oriented, appropriate affect Breasts: No suspicious masses palpated    LAB RESULTS:  Lab Results  Component Value Date   WBC 27.2 (H) 10/02/2018   NEUTROABS PENDING 10/02/2018   HGB 13.1 10/02/2018   HCT 40.8 10/02/2018   MCV 87.7 10/02/2018   PLT 124 (L) 10/02/2018      Chemistry      Component Value Date/Time   NA 139 09/25/2018 1122   NA 139 08/05/2017 1353   K 3.8 09/25/2018 1122   K 3.7 08/05/2017 1353  CL 107 09/25/2018 1122   CO2 25 09/25/2018 1122   CO2 27 08/05/2017 1353   BUN 12 09/25/2018 1122   BUN 17.3 08/05/2017 1353   CREATININE 0.88 09/25/2018 1122   CREATININE 1.0 08/05/2017 1353   GLU 101 (H) 10/18/2009 1542      Component Value Date/Time   CALCIUM 8.7 (L) 09/25/2018 1122   CALCIUM 9.2 08/05/2017 1353   ALKPHOS 103 09/25/2018 1122   ALKPHOS 76 08/05/2017 1353   AST 15 09/25/2018 1122   AST 18 08/05/2017 1353   ALT 12 09/25/2018 1122   ALT 14 08/05/2017 1353   BILITOT 0.8 09/25/2018 1122   BILITOT 0.59 08/05/2017 1353       No results found for: LABCA2  No components found for: LABCA125  No results for input(s): INR in the last 168 hours.  Urinalysis    Component Value Date/Time   COLORURINE LT. YELLOW 01/24/2010 1017   APPEARANCEUR CLEAR 01/24/2010 1017   LABSPEC <=1.005 01/24/2010 1017   PHURINE 5.5 01/24/2010 1017   GLUCOSEU NEGATIVE 01/24/2010 [place] 01/24/2010 1017   KETONESUR NEGATIVE 01/24/2010 1017   UROBILINOGEN 0.2 01/24/2010 1017   NITRITE NEGATIVE 01/24/2010 1017   LEUKOCYTESUR NEGATIVE 01/24/2010 1017    STUDIES: Vas [place] Lower Extremity Venous (dvt)  Result Date: 09/11/2018  Lower Venous Study Indications: Swelling.  Risk  Factors: Cancer Small lymphocytic lymphoma. Performing Technologist: Toma Copier RVS  Examination Guidelines: A complete evaluation includes B-mode imaging, spectral Doppler, color Doppler, and power Doppler as needed of all accessible portions of each vessel. Bilateral testing is considered an integral part of a complete examination. Limited examinations for reoccurring indications may be performed as noted.  Right Venous Findings: +---------+---------------+---------+-----------+----------+-------+          CompressibilityPhasicitySpontaneityPropertiesSummary +---------+---------------+---------+-----------+----------+-------+ CFV      Full           Yes      Yes                          +---------+---------------+---------+-----------+----------+-------+ SFJ      Full                                                 +---------+---------------+---------+-----------+----------+-------+ FV Prox  Full           Yes      Yes                          +---------+---------------+---------+-----------+----------+-------+ FV Mid   Full                                                 +---------+---------------+---------+-----------+----------+-------+ FV DistalFull           Yes      Yes                          +---------+---------------+---------+-----------+----------+-------+ PFV      Full           Yes      Yes                          +---------+---------------+---------+-----------+----------+-------+  POP      Full           Yes      Yes                          +---------+---------------+---------+-----------+----------+-------+ PTV      Full                                                 +---------+---------------+---------+-----------+----------+-------+ PERO     Full                                                 +---------+---------------+---------+-----------+----------+-------+  Left Venous Findings:  +---+---------------+---------+-----------+----------+-------+    CompressibilityPhasicitySpontaneityPropertiesSummary +---+---------------+---------+-----------+----------+-------+ CFVFull           Yes      Yes                          +---+---------------+---------+-----------+----------+-------+ SFJFull                                                 +---+---------------+---------+-----------+----------+-------+    Summary: Right: There is no evidence of deep vein thrombosis in the lower extremity. No cystic structure found in the popliteal fossa. Left: There is no evidence of a common femoral vein obstruction.  *See table(s) above for measurements and observations. Electronically signed by Curt Jews MD on 09/11/2018 at 5:35:30 PM.    Final     ASSESSMENT: 68 y.o. 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 There was no CD10 or cyclin D1 positivity identified  (4) started ibrutinib at 420 mg/ day 08/09/2014  (a) PET scan 07/23/2018 shows progressive adenopathy diffusely  (b) right axillary lymph node core biopsy shows features concerning but not definitive for Darron Doom transformation  (5) evidence of disease progression November 2019  (a) PET scan 07/23/2018 shows extensive progressive adenopathy.  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  Some of the response however may be actually be due to the dexamethasone we are using and premeds.  At any rate we still have significant adenopathy, the total white cell count at has not normalized, and she is having some "B" symptoms at least in the sense of significant fatigue.  Accordingly I think we need to reassess.  She will have a PET  scan within the next week.  I would like to switch her to bendamustine/rituximab for a minimum of 3 and a maximum of 6 cycles.  The goal here is debulking before we go on venetoclax which we would start subsequently.  Once we have a dose of venetoclax for her we will see if we can add the ibrutinib again and that will allow her to get some intervals of treatment once maximal response has been obtained  She has a very good understanding of all this and is agreeable to proceed.  I have suggested she meet with the financial counselor because all this of course is very expensive and we do not want her treatment to be a call because of bankruptcy  She has a trip to family the first week in February accordingly her first dose of bendamustine will be on 10/22/2018.  She has a good understanding of the possible toxicities, side effects and complications of this agent.  She will be on allopurinol and acyclovir prophylaxis during treatment  She knows to call for any other issue that may develop before her next treatment here.  I spent approximately 30 minutes face to face with Maria [name] with more than 50% of that time spent in counseling and coordination of care.     Wilmon Conover, [name] Dad, MD  10/02/18 9:44 AM Medical Oncology and Hematology [facility] [address] [place],  [address] Tel. [phone]    Fax. [phone]   I, Jacqualyn Posey am acting as a Education administrator for Chauncey Cruel, MD.    I, Lurline Del MD, have reviewed the above documentation for accuracy and completeness, and I agree with the above.

## 2018-10-02 ENCOUNTER — Inpatient Hospital Stay: Payer: Medicare Other

## 2018-10-02 ENCOUNTER — Telehealth: Payer: Self-pay | Admitting: Oncology

## 2018-10-02 ENCOUNTER — Encounter: Payer: Self-pay | Admitting: Oncology

## 2018-10-02 ENCOUNTER — Inpatient Hospital Stay: Payer: Medicare Other | Admitting: Oncology

## 2018-10-02 VITALS — BP 128/55 | HR 74 | Temp 98.0°F | Resp 18

## 2018-10-02 VITALS — BP 137/75 | HR 70 | Temp 97.5°F | Resp 18 | Ht 62.25 in | Wt 193.0 lb

## 2018-10-02 DIAGNOSIS — C9110 Chronic lymphocytic leukemia of B-cell type not having achieved remission: Secondary | ICD-10-CM

## 2018-10-02 DIAGNOSIS — Z5112 Encounter for antineoplastic immunotherapy: Secondary | ICD-10-CM | POA: Diagnosis not present

## 2018-10-02 LAB — COMPREHENSIVE METABOLIC PANEL
ALT: 13 U/L (ref 0–44)
AST: 14 U/L — ABNORMAL LOW (ref 15–41)
Albumin: 3.5 g/dL (ref 3.5–5.0)
Alkaline Phosphatase: 94 U/L (ref 38–126)
Anion gap: 9 (ref 5–15)
BUN: 14 mg/dL (ref 8–23)
CO2: 23 mmol/L (ref 22–32)
Calcium: 8.5 mg/dL — ABNORMAL LOW (ref 8.9–10.3)
Chloride: 107 mmol/L (ref 98–111)
Creatinine, Ser: 0.87 mg/dL (ref 0.44–1.00)
GFR calc non Af Amer: >60 mL/min (ref >60)
GLUCOSE: 132 mg/dL — AB (ref 70–99)
Potassium: 4 mmol/L (ref 3.5–5.1)
Sodium: 139 mmol/L (ref 135–145)
Total Bilirubin: 0.7 mg/dL (ref 0.3–1.2)
Total Protein: 5.8 g/dL — ABNORMAL LOW (ref 6.5–8.1)

## 2018-10-02 LAB — CBC WITH DIFFERENTIAL/PLATELET
Abs Immature Granulocytes: 0 10*3/uL (ref 0.00–0.07)
Basophils Absolute: 0 10*3/uL (ref 0.0–0.1)
Basophils Relative: 0 %
Eosinophils Absolute: 0 10*3/uL (ref 0.0–0.5)
Eosinophils Relative: 0 %
HCT: 40.8 % (ref 36.0–46.0)
HEMOGLOBIN: 13.1 g/dL (ref 12.0–15.0)
LYMPHS PCT: 86 %
Lymphs Abs: 23.4 10*3/uL — ABNORMAL HIGH (ref 0.7–4.0)
MCH: 28.2 pg (ref 26.0–34.0)
MCHC: 32.1 g/dL (ref 30.0–36.0)
MCV: 87.7 fL (ref 80.0–100.0)
MONOS PCT: 0 %
Monocytes Absolute: 0 10*3/uL — ABNORMAL LOW (ref 0.1–1.0)
Neutro Abs: 3.8 10*3/uL (ref 1.7–17.7)
Neutrophils Relative %: 14 %
Platelets: 124 10*3/uL — ABNORMAL LOW (ref 150–400)
RBC: 4.65 MIL/uL (ref 3.87–5.11)
RDW: 15 % (ref 11.5–15.5)
WBC: 27.2 10*3/uL — ABNORMAL HIGH (ref 4.0–10.5)
nRBC: 0 % (ref 0.0–0.2)

## 2018-10-02 LAB — LACTATE DEHYDROGENASE: LDH: 272 U/L — AB (ref 98–192)

## 2018-10-02 MED ORDER — SODIUM CHLORIDE 0.9 % IV SOLN
375.0000 mg/m2 | Freq: Once | INTRAVENOUS | Status: AC
  Administered 2018-10-02: 800 mg via INTRAVENOUS
  Filled 2018-10-02: qty 30

## 2018-10-02 MED ORDER — ALLOPURINOL 300 MG PO TABS: 300.0000 mg | ORAL_TABLET | Freq: Every day | ORAL | 3 refills | Status: DC

## 2018-10-02 MED ORDER — DEXAMETHASONE 4 MG PO TABS: 8.0000 mg | ORAL_TABLET | Freq: Every day | ORAL | 1 refills | Status: DC

## 2018-10-02 MED ORDER — DIPHENHYDRAMINE HCL 25 MG PO CAPS
25.0000 mg | ORAL_CAPSULE | Freq: Once | ORAL | Status: AC
  Administered 2018-10-02: 25 mg via ORAL

## 2018-10-02 MED ORDER — SODIUM CHLORIDE 0.9 % IV SOLN
20.0000 mg | Freq: Once | INTRAVENOUS | Status: AC
  Administered 2018-10-02: 20 mg via INTRAVENOUS
  Filled 2018-10-02: qty 2

## 2018-10-02 MED ORDER — ACYCLOVIR 400 MG PO TABS: 400.0000 mg | ORAL_TABLET | Freq: Every day | ORAL | 3 refills | Status: DC

## 2018-10-02 MED ORDER — SODIUM CHLORIDE 0.9 % IV SOLN
Freq: Once | INTRAVENOUS | Status: AC
  Administered 2018-10-02: 10:00:00 via INTRAVENOUS
  Filled 2018-10-02: qty 250

## 2018-10-02 MED ORDER — ACETAMINOPHEN 325 MG PO TABS
ORAL_TABLET | ORAL | Status: AC
  Filled 2018-10-02: qty 2

## 2018-10-02 MED ORDER — ACETAMINOPHEN 325 MG PO TABS
650.0000 mg | ORAL_TABLET | Freq: Once | ORAL | Status: AC
  Administered 2018-10-02: 650 mg via ORAL

## 2018-10-02 MED ORDER — DIPHENHYDRAMINE HCL 25 MG PO CAPS
ORAL_CAPSULE | ORAL | Status: AC
  Filled 2018-10-02: qty 1

## 2018-10-02 MED ORDER — LIDOCAINE-PRILOCAINE 2.5-2.5 % EX CREA: TOPICAL_CREAM | CUTANEOUS | 3 refills | Status: DC

## 2018-10-02 NOTE — Patient Instructions (Signed)
[facility] Discharge Instructions for Patients Receiving Chemotherapy  Today you received the following chemotherapy agents:  Rituxan.  To help prevent nausea and vomiting after your treatment, we encourage you to take your nausea medication as directed.   If you develop nausea and vomiting that is not controlled by your nausea medication, call the clinic.   BELOW ARE SYMPTOMS THAT SHOULD BE REPORTED IMMEDIATELY:  *FEVER GREATER THAN 100.5 F  *CHILLS WITH OR WITHOUT FEVER  NAUSEA AND VOMITING THAT IS NOT CONTROLLED WITH YOUR NAUSEA MEDICATION  *UNUSUAL SHORTNESS OF BREATH  *UNUSUAL BRUISING OR BLEEDING  TENDERNESS IN MOUTH AND THROAT WITH OR WITHOUT PRESENCE OF ULCERS  *URINARY PROBLEMS  *BOWEL PROBLEMS  UNUSUAL RASH Items with * indicate a potential emergency and should be followed up as soon as possible.  Feel free to call the clinic should you have any questions or concerns. The clinic phone number is [phone].  Please show the CHEMO ALERT CARD at check-in to the Emergency Department and triage nurse.   

## 2018-10-02 NOTE — Progress Notes (Signed)
Met w/ pt to discuss copay assistance.  She gave me consent to apply in her behalf so I applied to the [employer] [place] but unfortunately she was ineligible due to her income exceeding the program guidelines and she was also over qualified for the [employer].  I will notify pt of the outcome.

## 2018-10-02 NOTE — Telephone Encounter (Signed)
Gave avs and calendar ° °

## 2018-10-02 NOTE — Progress Notes (Signed)
START OFF PATHWAY REGIMEN - Lymphoma and CLL   [id number]:Bendamustine + Rituximab (70/500) q28 Days:   A cycle is every 28 days:     Bendamustine      Rituximab      Rituximab   **Always confirm dose/schedule in your pharmacy ordering system**  Patient Characteristics: Small Lymphocytic Lymphoma (SLL), Second Line, 17p del (-) or Unknown Disease Type: Small Lymphocytic Lymphoma (SLL) Disease Type: Not Applicable Disease Type: Not Applicable [place] Stage: IV Line of Therapy: Second Line 17p Deletion Status: Unknown Intent of Therapy: Non-Curative / Palliative Intent, Discussed with Patient

## 2018-10-03 ENCOUNTER — Other Ambulatory Visit: Payer: Self-pay | Admitting: *Deleted

## 2018-10-03 DIAGNOSIS — C9110 Chronic lymphocytic leukemia of B-cell type not having achieved remission: Secondary | ICD-10-CM

## 2018-10-03 LAB — IGG, IGA, IGM
IgA: 71 mg/dL — ABNORMAL LOW (ref 87–352)
IgG (Immunoglobin G), Serum: 340 mg/dL — ABNORMAL LOW (ref 700–1600)
IgM (Immunoglobulin M), Srm: 7 mg/dL — ABNORMAL LOW (ref 26–217)

## 2018-10-03 MED ORDER — LIDOCAINE-PRILOCAINE 2.5-2.5 % EX CREA: TOPICAL_CREAM | CUTANEOUS | 3 refills | Status: DC

## 2018-10-03 MED ORDER — ALLOPURINOL 300 MG PO TABS: 300.0000 mg | ORAL_TABLET | Freq: Every day | ORAL | 3 refills | Status: DC

## 2018-10-03 MED ORDER — ACYCLOVIR 400 MG PO TABS: 400.0000 mg | ORAL_TABLET | Freq: Every day | ORAL | 3 refills | Status: DC

## 2018-10-21 NOTE — Progress Notes (Signed)
[place]  [phone] [phone]   ID: Maria [name]   DOB: [birth year]  MR#: [id number]  YBO#:[id number]  Patient Care Team: Marton Redwood, MD as PCP - General (Internal Medicine) [name], MD as Consulting Physician (Orthopedic Surgery) [name], [name] Dad, MD as Consulting Physician (Oncology)   CHIEF COMPLAINT: Small lymphocytic lymphoma  CURRENT TREATMENT: bendamustine/ rituximab   HISTORY OF PRESENT ILLNESS: From the original intake note:  Maria [name] developed what she thought was "the flu" in December of 2002. She noted a large lymph node developing in her right anterior cervical area. 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   Her subsequent history is as detailed below    INTERVAL HISTORY: Maria [name] returns today for follow-up and treatment of her chronic lymphoid leukemia.   She continues on Ibrutinib. She doesn't believe that this treatment is working like it used to.  The patient also continues on rituximab, with a dose due today. This will be her seventh dose.   Her last restaging study was on 07/24/2018. She is scheduled for repeat PET scan on 11/03/2018.  REVIEW OF SYSTEMS: Maria [name] has noted an increase in her lower extremity edema, and some weight gain.  She recently traveled and notes she has had some swelling previously in her lower extremities.  She wears compression hose regularly, and noted the swelling started before her travels.  She has had dopplers and they have been negative.  She has also had mild dry cough.    Maria [name] denies fevers, chills.  She has noted a mild increase in lymphadenopathy after stopping the rituximab.  She denies headaches, nausea, or vomiting.  She denies night sweats.  She is fatigued.  She denies chest pain, palpitations, orthopnea, DOE, shortness of breath.  She had one episode of constipation.  She did resolve  this with miralax.  She denies any bladder issues.  A detailed ROS was otherwise non contributory.     PAST MEDICAL HISTORY: Past Medical History:  Diagnosis Date  . Allergy   . Arthritis   . Asthma   . Cancer [facility])    chronic lymphacytic leukemia  . CLL (chronic lymphocytic leukemia) ([place])   . Diabetes mellitus    PMH  . Headache    migraine  . Hyperlipidemia   . Hypertension   . Hypothyroidism   . Lymphoma ([place])   . Thyroid disease    hypothyroidism  . Wears glasses     PAST SURGICAL HISTORY: Past Surgical History:  Procedure Laterality Date  . ABDOMINAL HYSTERECTOMY    . AXILLARY LYMPH NODE BIOPSY Right 08/21/2018   Procedure: RIGHT AXILLARY LYMPH NODE BIOPSY ERAS PATHWAY;  Surgeon: [name], MD;  Location: [facility];  Service: General;  Laterality: Right;  LMA  . BREAST EXCISIONAL BIOPSY Right   . COLONOSCOPY    . LYMPH NODE DISSECTION    . STRABISMUS SURGERY    . TONSILLECTOMY    . TOTAL KNEE ARTHROPLASTY Right 12/03/2016   Procedure: RIGHT TOTAL KNEE ARTHROPLASTY;  Surgeon: [name], MD;  Location: [facility];  Service: Orthopedics;  Laterality: Right;    FAMILY HISTORY Family History  Problem Relation Age of Onset  . Liver cancer Mother   . Heart disease Father   . Breast cancer Sister 79  . Colon cancer Neg Hx   . Pancreatic cancer Neg Hx   . Rectal cancer Neg Hx   . Stomach cancer  Neg Hx    The patient's father died at the age of 70 from a myocardial infarction. The patient's mother died at the age of 54 from primary liver carcinoma. The patient had no brothers. Her one sister, Maria [name], has a history of breast cancer   GYNECOLOGIC HISTORY: Menarche at around age 61. The patient is GX P0. 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 She lives by herself with her cats Maria [name]  and [place]. She attends a Investment [employer], operational.     ADVANCED DIRECTIVES: Not in place  HEALTH MAINTENANCE: Social History   Tobacco Use  . Smoking status: Never Smoker  . Smokeless tobacco: Never Used  Substance Use Topics  . Alcohol use: Yes    Comment: occasional alcohol intake; once or twice monthly  . Drug use: No     Colonoscopy: 06/01/2014/[facility]  PAP:  Bone density:  Lipid panel:  228/114/50/155/ratio 4.6  On 10/01/2016  Allergies  Allergen Reactions  . Iohexol Swelling and Rash  . Compazine [Prochlorperazine Edisylate] Other (See Comments)    Mild confusion  . Contrast Media  [Iodinated Diagnostic Agents]   . Lactose Intolerance (Gi)     Diarrhea, gas bloating  . Lactulose Diarrhea and Other (See Comments)    gas bloating  . Cephalexin Rash    Current Outpatient Medications  Medication Sig Dispense Refill  . acyclovir (ZOVIRAX) 400 MG tablet Take 1 tablet (400 mg total) by mouth daily. 30 tablet 3  . allopurinol (ZYLOPRIM) 300 MG tablet Take 1 tablet (300 mg total) by mouth daily. 30 tablet 3  . dexamethasone (DECADRON) 4 MG tablet Take 2 tablets (8 mg total) by mouth daily. Start the day after bendamustine chemotherapy for 2 days. Take with food. 30 tablet 1  . hydrochlorothiazide (HYDRODIURIL) 12.5 MG tablet Take 12.5 mg by mouth daily.     . IMBRUVICA 420 MG TABS     . lidocaine-prilocaine (EMLA) cream Apply to affected area once 30 g 3  . rosuvastatin (CRESTOR) 10 MG tablet Take 10 mg by mouth at bedtime.  11  . SYNTHROID 88 MCG tablet Take 88 mcg by mouth daily before breakfast.     . vitamin B-12 (CYANOCOBALAMIN) 500 MCG tablet Take 500 mcg by mouth daily.    . zoledronic acid (RECLAST) 5 MG/100ML SOLN injection Inject 5 mg into the vein once. Once a year     No current facility-administered medications for this visit.     OBJECTIVE:  Vitals:   10/22/18 0800  BP: (!) 124/58  [name]: 72  Resp: 18  Temp: 97.9 F (36.6 C)  SpO2: 97%     Body  mass index is 36.18 kg/m.    ECOG FS: 1  GENERAL: Patient is a well appearing female in no acute distress HEENT:  Sclerae anicteric.  Oropharynx clear and moist. No ulcerations or evidence of oropharyngeal candidiasis. Neck is supple.   NODES:  + cervical lymphadenopathy, axillary lymphadenopathy and inguinal lymphadenopathy noted.    LUNGS:  Clear to auscultation bilaterally.  No wheezes or rhonchi. HEART:  Regular rate and rhythm. No murmur appreciated. ABDOMEN:  Soft, nontender.  Positive, normoactive bowel sounds. No organomegaly palpated. MSK:  No focal spinal tenderness to palpation. Full range of motion bilaterally in the upper extremities. EXTREMITIES:  No peripheral edema.   SKIN:  Clear  with no obvious rashes or skin changes. No nail dyscrasia. NEURO:  Nonfocal. Well oriented.  Appropriate affect.      LAB RESULTS:  Lab Results  Component Value Date   WBC 36.0 (H) 10/22/2018   NEUTROABS PENDING 10/22/2018   HGB 12.6 10/22/2018   HCT 40.4 10/22/2018   MCV 89.0 10/22/2018   PLT 172 10/22/2018      Chemistry      Component Value Date/Time   NA 139 10/02/2018 0836   NA 139 08/05/2017 1353   K 4.0 10/02/2018 0836   K 3.7 08/05/2017 1353   CL 107 10/02/2018 0836   CO2 23 10/02/2018 0836   CO2 27 08/05/2017 1353   BUN 14 10/02/2018 0836   BUN 17.3 08/05/2017 1353   CREATININE 0.87 10/02/2018 0836   CREATININE 1.0 08/05/2017 1353   GLU 101 (H) 10/18/2009 1542      Component Value Date/Time   CALCIUM 8.5 (L) 10/02/2018 0836   CALCIUM 9.2 08/05/2017 1353   ALKPHOS 94 10/02/2018 0836   ALKPHOS 76 08/05/2017 1353   AST 14 (L) 10/02/2018 0836   AST 18 08/05/2017 1353   ALT 13 10/02/2018 0836   ALT 14 08/05/2017 1353   BILITOT 0.7 10/02/2018 0836   BILITOT 0.59 08/05/2017 1353       No results found for: LABCA2  No components found for: LABCA125  No results for input(s): INR in the last 168 hours.  Urinalysis    Component Value Date/Time   COLORURINE  LT. YELLOW 01/24/2010 1017   APPEARANCEUR CLEAR 01/24/2010 1017   LABSPEC <=1.005 01/24/2010 1017   PHURINE 5.5 01/24/2010 1017   GLUCOSEU NEGATIVE 01/24/2010 [place] 01/24/2010 1017   KETONESUR NEGATIVE 01/24/2010 1017   UROBILINOGEN 0.2 01/24/2010 1017   NITRITE NEGATIVE 01/24/2010 [place] 01/24/2010 1017    STUDIES: No results found.  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 There was no CD10 or cyclin D1 positivity identified  (4) started ibrutinib at 420 mg/ day 08/09/2014  (a) PET scan 07/23/2018 shows progressive adenopathy diffusely  (b) right axillary lymph node core biopsy shows features concerning but not definitive for Darron Doom transformation  (5) evidence of disease progression November 2019  (a) PET scan 07/23/2018 shows extensive progressive adenopathy.  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  She will receive treatment today with R-Bendamustine, and Bendamustine alone tomorrow.  She had not yet started taking Acyclovir and Allopurinol. I instructed her to start these two today when she returns home.  I also reviewed with her to  start the Dexamethasone beginning on Friday.  We reviewed that she will be getting IV aloxi which will last for three days, so she can start taking Ondansetron on Saturday.  I reviewed her labs with her.  Her WBC is slightly more elevated at 36.    Maria [name] has some lower extremity swelling and weight gain.  We will give her 10mg  of IV lasix before her treatment today to see if that helps.  If so, we may consider adding a PRN lasix prescription that she can take.  She knows that lasix can lower her potassium level and notes that she eats foods rich in potassium.    Eliyanna and I reviewed the issue of her port.  I let her know that I would review it with Dr. Jana Hakim and let her know what the answer is.    Maria [name] will undergo PET scan on 2/24.  She is looking into what her copay is.  She notes that if she just has to pay $100 she will undergo the scan, but if she has to pay any more than that she will not.    Maria [name] will return for labs and evaluation with [place] in 1 week to see how she tolerated treatment.  She knows to call for any other issue that may develop before her next treatment here.  I reviewed the above plan with Dr. Jana Hakim in detail and he is in agreement with it.    A total of (30) minutes of face-to-face time was spent with this patient with greater than 50% of that time in counseling and care-coordination.  Wilber Bihari, NP  10/22/18 8:24 AM Medical Oncology and Hematology [facility] [address] [place], [place] [address] Tel. [phone]    Fax. [phone]

## 2018-10-22 ENCOUNTER — Inpatient Hospital Stay: Payer: Medicare Other | Attending: Oncology

## 2018-10-22 ENCOUNTER — Inpatient Hospital Stay (HOSPITAL_BASED_OUTPATIENT_CLINIC_OR_DEPARTMENT_OTHER): Payer: Medicare Other | Admitting: Adult Health

## 2018-10-22 ENCOUNTER — Inpatient Hospital Stay: Payer: Medicare Other

## 2018-10-22 ENCOUNTER — Encounter: Payer: Self-pay | Admitting: Adult Health

## 2018-10-22 ENCOUNTER — Telehealth: Payer: Self-pay | Admitting: Adult Health

## 2018-10-22 VITALS — BP 124/58 | HR 72 | Temp 97.9°F | Resp 18 | Ht 62.25 in | Wt 199.4 lb

## 2018-10-22 VITALS — BP 123/63 | HR 77 | Temp 98.0°F | Resp 18

## 2018-10-22 DIAGNOSIS — C9110 Chronic lymphocytic leukemia of B-cell type not having achieved remission: Secondary | ICD-10-CM

## 2018-10-22 DIAGNOSIS — Z5111 Encounter for antineoplastic chemotherapy: Secondary | ICD-10-CM | POA: Diagnosis present

## 2018-10-22 DIAGNOSIS — R635 Abnormal weight gain: Secondary | ICD-10-CM | POA: Diagnosis not present

## 2018-10-22 DIAGNOSIS — Z5112 Encounter for antineoplastic immunotherapy: Secondary | ICD-10-CM | POA: Diagnosis present

## 2018-10-22 DIAGNOSIS — R600 Localized edema: Secondary | ICD-10-CM

## 2018-10-22 LAB — CBC WITH DIFFERENTIAL/PLATELET
ABS IMMATURE GRANULOCYTES: 0 10*3/uL (ref 0.00–0.07)
Basophils Absolute: 0 10*3/uL (ref 0.0–0.1)
Basophils Relative: 0 %
EOS ABS: 0 10*3/uL (ref 0.0–0.5)
Eosinophils Relative: 0 %
HCT: 40.4 % (ref 36.0–46.0)
Hemoglobin: 12.6 g/dL (ref 12.0–15.0)
Lymphocytes Relative: 87 %
Lymphs Abs: 31.3 10*3/uL — ABNORMAL HIGH (ref 0.7–4.0)
MCH: 27.8 pg (ref 26.0–34.0)
MCHC: 31.2 g/dL (ref 30.0–36.0)
MCV: 89 fL (ref 80.0–100.0)
Monocytes Absolute: 1.8 10*3/uL — ABNORMAL HIGH (ref 0.1–1.0)
Monocytes Relative: 5 %
NEUTROS PCT: 8 %
Neutro Abs: 2.9 10*3/uL (ref 1.7–17.7)
Platelets: 172 10*3/uL (ref 150–400)
RBC: 4.54 MIL/uL (ref 3.87–5.11)
RDW: 14.9 % (ref 11.5–15.5)
WBC: 36 10*3/uL — ABNORMAL HIGH (ref 4.0–10.5)
nRBC: 0.1 % (ref 0.0–0.2)

## 2018-10-22 LAB — COMPREHENSIVE METABOLIC PANEL
ALT: <6 U/L (ref 0–44)
AST: 15 U/L (ref 15–41)
Albumin: 3.6 g/dL (ref 3.5–5.0)
Alkaline Phosphatase: 87 U/L (ref 38–126)
Anion gap: 10 (ref 5–15)
BUN: 14 mg/dL (ref 8–23)
CHLORIDE: 106 mmol/L (ref 98–111)
CO2: 25 mmol/L (ref 22–32)
Calcium: 8.2 mg/dL — ABNORMAL LOW (ref 8.9–10.3)
Creatinine, Ser: 1.01 mg/dL — ABNORMAL HIGH (ref 0.44–1.00)
GFR calc Af Amer: >60 mL/min (ref >60)
GFR calc non Af Amer: 58 mL/min — ABNORMAL LOW (ref >60)
GLUCOSE: 152 mg/dL — AB (ref 70–99)
Potassium: 3.7 mmol/L (ref 3.5–5.1)
Sodium: 141 mmol/L (ref 135–145)
Total Bilirubin: 0.7 mg/dL (ref 0.3–1.2)
Total Protein: 5.9 g/dL — ABNORMAL LOW (ref 6.5–8.1)

## 2018-10-22 LAB — LACTATE DEHYDROGENASE: LDH: 289 U/L — AB (ref 98–192)

## 2018-10-22 MED ORDER — FUROSEMIDE 10 MG/ML IJ SOLN
INTRAMUSCULAR | Status: AC
  Filled 2018-10-22: qty 2

## 2018-10-22 MED ORDER — DEXAMETHASONE SODIUM PHOSPHATE 10 MG/ML IJ SOLN
INTRAMUSCULAR | Status: AC
  Filled 2018-10-22: qty 1

## 2018-10-22 MED ORDER — SODIUM CHLORIDE 0.9 % IV SOLN
500.0000 mg/m2 | Freq: Once | INTRAVENOUS | Status: AC
  Administered 2018-10-22: 1000 mg via INTRAVENOUS
  Filled 2018-10-22: qty 100

## 2018-10-22 MED ORDER — SODIUM CHLORIDE 0.9 % IV SOLN: 10.0000 mg | Freq: Once | INTRAVENOUS | Status: DC

## 2018-10-22 MED ORDER — PALONOSETRON HCL INJECTION 0.25 MG/5ML
0.2500 mg | Freq: Once | INTRAVENOUS | Status: AC
  Administered 2018-10-22: 0.25 mg via INTRAVENOUS

## 2018-10-22 MED ORDER — SODIUM CHLORIDE 0.9 % IV SOLN: 500.0000 mg/m2 | Freq: Once | INTRAVENOUS | Status: DC

## 2018-10-22 MED ORDER — DEXAMETHASONE SODIUM PHOSPHATE 10 MG/ML IJ SOLN
10.0000 mg | Freq: Once | INTRAMUSCULAR | Status: AC
  Administered 2018-10-22: 10 mg via INTRAVENOUS

## 2018-10-22 MED ORDER — SODIUM CHLORIDE 0.9 % IV SOLN
70.0000 mg/m2 | Freq: Once | INTRAVENOUS | Status: AC
  Administered 2018-10-22: 125 mg via INTRAVENOUS
  Filled 2018-10-22: qty 5

## 2018-10-22 MED ORDER — ACETAMINOPHEN 325 MG PO TABS
ORAL_TABLET | ORAL | Status: AC
  Filled 2018-10-22: qty 2

## 2018-10-22 MED ORDER — DIPHENHYDRAMINE HCL 25 MG PO CAPS
25.0000 mg | ORAL_CAPSULE | Freq: Once | ORAL | Status: AC
  Administered 2018-10-22: 25 mg via ORAL

## 2018-10-22 MED ORDER — DIPHENHYDRAMINE HCL 25 MG PO CAPS
ORAL_CAPSULE | ORAL | Status: AC
  Filled 2018-10-22: qty 1

## 2018-10-22 MED ORDER — FUROSEMIDE 10 MG/ML IJ SOLN
10.0000 mg | Freq: Once | INTRAMUSCULAR | Status: AC
  Administered 2018-10-22: 10 mg via INTRAVENOUS

## 2018-10-22 MED ORDER — PALONOSETRON HCL INJECTION 0.25 MG/5ML
INTRAVENOUS | Status: AC
  Filled 2018-10-22: qty 5

## 2018-10-22 MED ORDER — ACETAMINOPHEN 325 MG PO TABS
650.0000 mg | ORAL_TABLET | Freq: Once | ORAL | Status: AC
  Administered 2018-10-22: 650 mg via ORAL

## 2018-10-22 MED ORDER — SODIUM CHLORIDE 0.9 % IV SOLN
Freq: Once | INTRAVENOUS | Status: AC
  Administered 2018-10-22: 10:00:00 via INTRAVENOUS
  Filled 2018-10-22: qty 250

## 2018-10-22 NOTE — Patient Instructions (Signed)
[place] Discharge Instructions for Patients Receiving Chemotherapy  Today you received the following chemotherapy agents Rituxan and Bendeka  To help prevent nausea and vomiting after your treatment, we encourage you to take your nausea medication as prescribed.   If you develop nausea and vomiting that is not controlled by your nausea medication, call the clinic.   BELOW ARE SYMPTOMS THAT SHOULD BE REPORTED IMMEDIATELY:  *FEVER GREATER THAN 100.5 F  *CHILLS WITH OR WITHOUT FEVER  NAUSEA AND VOMITING THAT IS NOT CONTROLLED WITH YOUR NAUSEA MEDICATION  *UNUSUAL SHORTNESS OF BREATH  *UNUSUAL BRUISING OR BLEEDING  TENDERNESS IN MOUTH AND THROAT WITH OR WITHOUT PRESENCE OF ULCERS  *URINARY PROBLEMS  *BOWEL PROBLEMS  UNUSUAL RASH Items with * indicate a potential emergency and should be followed up as soon as possible.  Feel free to call the clinic should you have any questions or concerns. The clinic phone number is [phone].  Please show the [place] at check-in to the Emergency Department and triage nurse.   Rituximab injection (Rituxan) What is this medicine? RITUXIMAB (ri TUX i mab) is a monoclonal antibody. It is used to treat certain types of cancer like non-Hodgkin lymphoma and chronic lymphocytic leukemia. It is also used to treat rheumatoid arthritis, granulomatosis with polyangiitis (or Wegener's granulomatosis), microscopic polyangiitis, and pemphigus vulgaris. This medicine may be used for other purposes; ask your health care provider or pharmacist if you have questions. COMMON BRAND NAME(S): Rituxan What should I tell my health care provider before I take this medicine? They need to know if you have any of these conditions: -heart disease -infection (especially a virus infection such as hepatitis B, chickenpox, cold sores, or herpes) -immune system problems -irregular heartbeat -kidney disease -low blood counts, like low white  cell, platelet, or red cell counts -lung or breathing disease, like asthma -recently received or scheduled to receive a vaccine -an unusual or allergic reaction to rituximab, other medicines, foods, dyes, or preservatives -pregnant or trying to get pregnant -breast-feeding How should I use this medicine? This medicine is for infusion into a vein. It is administered in a hospital or clinic by a specially trained health care professional. A special MedGuide will be given to you by the pharmacist with each prescription and refill. Be sure to read this information carefully each time. Talk to your pediatrician regarding the use of this medicine in children. This medicine is not approved for use in children. Overdosage: If you think you have taken too much of this medicine contact a poison control center or emergency room at once. NOTE: This medicine is only for you. Do not share this medicine with others. What if I miss a dose? It is important not to miss a dose. Call your doctor or health care professional if you are unable to keep an appointment. What may interact with this medicine? -cisplatin -live virus vaccines This list may not describe all possible interactions. Give your health care provider a list of all the medicines, herbs, non-prescription drugs, or dietary supplements you use. Also tell them if you smoke, drink alcohol, or use illegal drugs. Some items may interact with your medicine. What should I watch for while using this medicine? Your condition will be monitored carefully while you are receiving this medicine. You may need blood work done while you are taking this medicine. This medicine can cause serious allergic reactions. To reduce your risk you may need to take medicine before treatment with this medicine. Take  your medicine as directed. In some patients, this medicine may cause a serious brain infection that may cause death. If you have any problems seeing, thinking,  speaking, walking, or standing, tell your healthcare professional right away. If you cannot reach your healthcare professional, urgently seek other source of medical care. Call your doctor or health care professional for advice if you get a fever, chills or sore throat, or other symptoms of a cold or flu. Do not treat yourself. This drug decreases your body's ability to fight infections. Try to avoid being around people who are sick. Do not become pregnant while taking this medicine or for 12 months after stopping it. Women should inform their doctor if they wish to become pregnant or think they might be pregnant. There is a potential for serious side effects to an unborn child. Talk to your health care professional or pharmacist for more information. Do not breast-feed an infant while taking this medicine or for 6 months after stopping it. What side effects may I notice from receiving this medicine? Side effects that you should report to your doctor or health care professional as soon as possible: -allergic reactions like skin rash, itching or hives; swelling of the face, lips, or tongue -breathing problems -chest pain -changes in vision -diarrhea -headache with fever, neck stiffness, sensitivity to light, nausea, or confusion -fast, irregular heartbeat -loss of memory -low blood counts - this medicine may decrease the number of white blood cells, red blood cells and platelets. You may be at increased risk for infections and bleeding. -mouth sores -problems with balance, talking, or walking -redness, blistering, peeling or loosening of the skin, including inside the mouth -signs of infection - fever or chills, cough, sore throat, pain or difficulty passing urine -signs and symptoms of kidney injury like trouble passing urine or change in the amount of urine -signs and symptoms of liver injury like dark yellow or brown urine; general ill feeling or flu-like symptoms; light-colored stools; loss of  appetite; nausea; right upper belly pain; unusually weak or tired; yellowing of the eyes or skin -signs and symptoms of low blood pressure like dizziness; feeling faint or lightheaded, falls; unusually weak or tired -stomach pain -swelling of the ankles, feet, hands -unusual bleeding or bruising -vomiting Side effects that usually do not require medical attention (report to your doctor or health care professional if they continue or are bothersome): -headache -joint pain -muscle cramps or muscle pain -nausea -tiredness This list may not describe all possible side effects. Call your doctor for medical advice about side effects. You may report side effects to FDA at 1-800-FDA-1088. Where should I keep my medicine? This drug is given in a hospital or clinic and will not be stored at home. NOTE: This sheet is a summary. It may not cover all possible information. If you have questions about this medicine, talk to your doctor, pharmacist, or health care provider.  2019 Elsevier/Gold Standard (2017-08-09 13:04:32)  Bendamustine Injection (Bendeka) What is this medicine? BENDAMUSTINE (BEN da MUS teen) is a chemotherapy drug. It is used to treat chronic lymphocytic leukemia and non-Hodgkin lymphoma. This medicine may be used for other purposes; ask your health care provider or pharmacist if you have questions. COMMON BRAND NAME(S): BENDEKA, Treanda What should I tell my health care provider before I take this medicine? They need to know if you have any of these conditions: -infection (especially a virus infection such as chickenpox, cold sores, or herpes) -kidney disease -liver disease -an unusual or  allergic reaction to bendamustine, mannitol, other medicines, foods, dyes, or preservatives -pregnant or trying to get pregnant -breast-feeding How should I use this medicine? This medicine is for infusion into a vein. It is given by a health care professional in a hospital or clinic  setting. Talk to your pediatrician regarding the use of this medicine in children. Special care may be needed. Overdosage: If you think you have taken too much of this medicine contact a poison control center or emergency room at once. NOTE: This medicine is only for you. Do not share this medicine with others. What if I miss a dose? It is important not to miss your dose. Call your doctor or health care professional if you are unable to keep an appointment. What may interact with this medicine? Do not take this medicine with any of the following medications: -clozapine This medicine may also interact with the following medications: -atazanavir -cimetidine -ciprofloxacin -enoxacin -fluvoxamine -medicines for seizures like carbamazepine and phenobarbital -mexiletine -rifampin -tacrine -thiabendazole -zileuton This list may not describe all possible interactions. Give your health care provider a list of all the medicines, herbs, non-prescription drugs, or dietary supplements you use. Also tell them if you smoke, drink alcohol, or use illegal drugs. Some items may interact with your medicine. What should I watch for while using this medicine? This drug may make you feel generally unwell. This is not uncommon, as chemotherapy can affect healthy cells as well as cancer cells. Report any side effects. Continue your course of treatment even though you feel ill unless your doctor tells you to stop. You may need blood work done while you are taking this medicine. Call your doctor or health care professional for advice if you get a fever, chills or sore throat, or other symptoms of a cold or flu. Do not treat yourself. This drug decreases your body's ability to fight infections. Try to avoid being around people who are sick. This medicine may increase your risk to bruise or bleed. Call your doctor or health care professional if you notice any unusual bleeding. Talk to your doctor about your risk of  cancer. You may be more at risk for certain types of cancers if you take this medicine. Do not become pregnant while taking this medicine or for 3 months after stopping it. Women should inform their doctor if they wish to become pregnant or think they might be pregnant. Men should not father a child while taking this medicine and for 3 months after stopping it.There is a potential for serious side effects to an unborn child. Talk to your health care professional or pharmacist for more information. Do not breast-feed an infant while taking this medicine. This medicine may interfere with the ability to have a child. You should talk with your doctor or health care professional if you are concerned about your fertility. What side effects may I notice from receiving this medicine? Side effects that you should report to your doctor or health care professional as soon as possible: -allergic reactions like skin rash, itching or hives, swelling of the face, lips, or tongue -low blood counts - this medicine may decrease the number of white blood cells, red blood cells and platelets. You may be at increased risk for infections and bleeding. -redness, blistering, peeling or loosening of the skin, including inside the mouth -signs of infection - fever or chills, cough, sore throat, pain or difficulty passing urine -signs of decreased platelets or bleeding - bruising, pinpoint red spots on the  skin, black, tarry stools, blood in the urine -signs of decreased red blood cells - unusually weak or tired, fainting spells, lightheadedness -signs and symptoms of kidney injury like trouble passing urine or change in the amount of urine -signs and symptoms of liver injury like dark yellow or brown urine; general ill feeling or flu-like symptoms; light-colored stools; loss of appetite; nausea; right upper belly pain; unusually weak or tired; yellowing of the eyes or skin Side effects that usually do not require medical  attention (report to your doctor or health care professional if they continue or are bothersome): -constipation -decreased appetite -diarrhea -headache -mouth sores -nausea/vomiting -tiredness This list may not describe all possible side effects. Call your doctor for medical advice about side effects. You may report side effects to FDA at 1-800-FDA-1088. Where should I keep my medicine? This drug is given in a hospital or clinic and will not be stored at home. NOTE: This sheet is a summary. It may not cover all possible information. If you have questions about this medicine, talk to your doctor, pharmacist, or health care provider.  2019 Elsevier/Gold Standard (2015-06-30 08:45:41)

## 2018-10-22 NOTE — Telephone Encounter (Signed)
Gave avs and calendar ° °

## 2018-10-22 NOTE — Progress Notes (Signed)
Spoke w/ M. Gujrati RPH in pharmacy regarding dosing of Bendeka. OK to infuse 125mg  dose.

## 2018-10-22 NOTE — Progress Notes (Signed)
Ok to continue rapid rituxan per dr Jana Hakim

## 2018-10-23 ENCOUNTER — Inpatient Hospital Stay: Payer: Medicare Other

## 2018-10-23 ENCOUNTER — Other Ambulatory Visit: Payer: Self-pay | Admitting: *Deleted

## 2018-10-23 VITALS — BP 149/65 | HR 73 | Temp 97.9°F | Resp 18

## 2018-10-23 DIAGNOSIS — Z5112 Encounter for antineoplastic immunotherapy: Secondary | ICD-10-CM | POA: Diagnosis not present

## 2018-10-23 DIAGNOSIS — C9110 Chronic lymphocytic leukemia of B-cell type not having achieved remission: Secondary | ICD-10-CM

## 2018-10-23 LAB — IGG, IGA, IGM
IgA: 74 mg/dL — ABNORMAL LOW (ref 87–352)
IgG (Immunoglobin G), Serum: 296 mg/dL — ABNORMAL LOW (ref 700–1600)
IgM (Immunoglobulin M), Srm: 7 mg/dL — ABNORMAL LOW (ref 26–217)

## 2018-10-23 MED ORDER — ONDANSETRON HCL 8 MG PO TABS: 8.0000 mg | ORAL_TABLET | Freq: Three times a day (TID) | ORAL | 3 refills | Status: DC | PRN

## 2018-10-23 MED ORDER — SODIUM CHLORIDE 0.9 % IV SOLN
Freq: Once | INTRAVENOUS | Status: AC
  Administered 2018-10-23: 09:00:00 via INTRAVENOUS
  Filled 2018-10-23: qty 250

## 2018-10-23 MED ORDER — DEXAMETHASONE SODIUM PHOSPHATE 10 MG/ML IJ SOLN
INTRAMUSCULAR | Status: AC
  Filled 2018-10-23: qty 1

## 2018-10-23 MED ORDER — SODIUM CHLORIDE 0.9 % IV SOLN
70.0000 mg/m2 | Freq: Once | INTRAVENOUS | Status: AC
  Administered 2018-10-23: 125 mg via INTRAVENOUS
  Filled 2018-10-23: qty 5

## 2018-10-23 MED ORDER — DEXAMETHASONE SODIUM PHOSPHATE 10 MG/ML IJ SOLN
10.0000 mg | Freq: Once | INTRAMUSCULAR | Status: AC
  Administered 2018-10-23: 10 mg via INTRAVENOUS

## 2018-10-23 NOTE — Patient Instructions (Signed)
[facility] Discharge Instructions for Patients Receiving Chemotherapy  Today you received the following chemotherapy agents Bendeka.   To help prevent nausea and vomiting after your treatment, we encourage you to take your nausea medication as prescribed.    If you develop nausea and vomiting that is not controlled by your nausea medication, call the clinic.   BELOW ARE SYMPTOMS THAT SHOULD BE REPORTED IMMEDIATELY:  *FEVER GREATER THAN 100.5 F  *CHILLS WITH OR WITHOUT FEVER  NAUSEA AND VOMITING THAT IS NOT CONTROLLED WITH YOUR NAUSEA MEDICATION  *UNUSUAL SHORTNESS OF BREATH  *UNUSUAL BRUISING OR BLEEDING  TENDERNESS IN MOUTH AND THROAT WITH OR WITHOUT PRESENCE OF ULCERS  *URINARY PROBLEMS  *BOWEL PROBLEMS  UNUSUAL RASH Items with * indicate a potential emergency and should be followed up as soon as possible.  Feel free to call the clinic should you have any questions or concerns. The clinic phone number is [phone].  Please show the CHEMO ALERT CARD at check-in to the Emergency Department and triage nurse.  

## 2018-10-29 ENCOUNTER — Encounter (HOSPITAL_COMMUNITY)
Admission: RE | Admit: 2018-10-29 | Discharge: 2018-10-29 | Disposition: A | Discharge status: Home / Self Care | Payer: Medicare Other | Source: Ambulatory Visit | Attending: Oncology | Admitting: Oncology

## 2018-10-29 DIAGNOSIS — I700 Atherosclerosis of aorta: Secondary | ICD-10-CM | POA: Diagnosis not present

## 2018-10-29 DIAGNOSIS — C9110 Chronic lymphocytic leukemia of B-cell type not having achieved remission: Secondary | ICD-10-CM | POA: Insufficient documentation

## 2018-10-29 DIAGNOSIS — R591 Generalized enlarged lymph nodes: Secondary | ICD-10-CM | POA: Diagnosis not present

## 2018-10-29 LAB — GLUCOSE, CAPILLARY: Glucose-Capillary: 87 mg/dL (ref 70–99)

## 2018-10-29 MED ORDER — FLUDEOXYGLUCOSE F - 18 (FDG) INJECTION
9.9000 | Freq: Once | INTRAVENOUS | Status: AC | PRN
  Administered 2018-10-29: 9.9 via INTRAVENOUS

## 2018-10-30 ENCOUNTER — Inpatient Hospital Stay: Payer: Medicare Other | Admitting: Adult Health

## 2018-10-30 ENCOUNTER — Telehealth: Payer: Self-pay | Admitting: *Deleted

## 2018-10-30 ENCOUNTER — Encounter: Payer: Self-pay | Admitting: Adult Health

## 2018-10-30 ENCOUNTER — Inpatient Hospital Stay: Payer: Medicare Other

## 2018-10-30 ENCOUNTER — Other Ambulatory Visit: Payer: Self-pay | Admitting: Oncology

## 2018-10-30 ENCOUNTER — Telehealth: Payer: Self-pay | Admitting: Adult Health

## 2018-10-30 VITALS — BP 133/54 | HR 75 | Temp 98.5°F | Resp 18 | Ht 62.25 in | Wt 197.4 lb

## 2018-10-30 DIAGNOSIS — Z5112 Encounter for antineoplastic immunotherapy: Secondary | ICD-10-CM | POA: Diagnosis not present

## 2018-10-30 DIAGNOSIS — M7989 Other specified soft tissue disorders: Secondary | ICD-10-CM | POA: Diagnosis not present

## 2018-10-30 DIAGNOSIS — C9110 Chronic lymphocytic leukemia of B-cell type not having achieved remission: Secondary | ICD-10-CM

## 2018-10-30 LAB — COMPREHENSIVE METABOLIC PANEL
ALT: 17 U/L (ref 0–44)
AST: 30 U/L (ref 15–41)
Albumin: 3.6 g/dL (ref 3.5–5.0)
Alkaline Phosphatase: 116 U/L (ref 38–126)
Anion gap: 9 (ref 5–15)
BUN: 7 mg/dL — ABNORMAL LOW (ref 8–23)
CHLORIDE: 105 mmol/L (ref 98–111)
CO2: 26 mmol/L (ref 22–32)
Calcium: 9.2 mg/dL (ref 8.9–10.3)
Creatinine, Ser: 0.95 mg/dL (ref 0.44–1.00)
GFR calc Af Amer: >60 mL/min (ref >60)
GFR calc non Af Amer: >60 mL/min (ref >60)
GLUCOSE: 93 mg/dL (ref 70–99)
Potassium: 3.7 mmol/L (ref 3.5–5.1)
Sodium: 140 mmol/L (ref 135–145)
Total Bilirubin: 1.2 mg/dL (ref 0.3–1.2)
Total Protein: 5.6 g/dL — ABNORMAL LOW (ref 6.5–8.1)

## 2018-10-30 LAB — CBC WITH DIFFERENTIAL/PLATELET
Abs Immature Granulocytes: 0 10*3/uL (ref 0.00–0.07)
BASOS ABS: 0 10*3/uL (ref 0.0–0.1)
Basophils Relative: 0 %
EOS ABS: 0.5 10*3/uL (ref 0.0–0.5)
EOS PCT: 2 %
HCT: 42.8 % (ref 36.0–46.0)
Hemoglobin: 13.6 g/dL (ref 12.0–15.0)
Lymphocytes Relative: 77 %
Lymphs Abs: 18.6 10*3/uL — ABNORMAL HIGH (ref 0.7–4.0)
MCH: 28 pg (ref 26.0–34.0)
MCHC: 31.8 g/dL (ref 30.0–36.0)
MCV: 88.1 fL (ref 80.0–100.0)
Monocytes Absolute: 0 10*3/uL — ABNORMAL LOW (ref 0.1–1.0)
Monocytes Relative: 0 %
Neutro Abs: 5.1 10*3/uL (ref 1.7–17.7)
Neutrophils Relative %: 21 %
Platelets: 145 10*3/uL — ABNORMAL LOW (ref 150–400)
RBC: 4.86 MIL/uL (ref 3.87–5.11)
RDW: 15 % (ref 11.5–15.5)
WBC: 24.2 10*3/uL — ABNORMAL HIGH (ref 4.0–10.5)
nRBC: 0.1 % (ref 0.0–0.2)

## 2018-10-30 LAB — LACTATE DEHYDROGENASE: LDH: 640 U/L — ABNORMAL HIGH (ref 98–192)

## 2018-10-30 NOTE — Telephone Encounter (Signed)
[place] at [phone].  Message left requesting a return call for chemotherapy follow up.  Requested return call if message retrieved before today's 2:00 pm appointment.

## 2018-10-30 NOTE — Telephone Encounter (Signed)
No los °

## 2018-10-30 NOTE — Progress Notes (Signed)
[name]  [phone] [phone]   ID: Maria [name]   DOB: 05/14/1951  MR#: [id number]  TIR#:[id number]  Patient Care Team: Marton Redwood, MD as PCP - General (Internal Medicine) [name], MD as Consulting Physician (Orthopedic Surgery) [name], [name] Dad, MD as Consulting Physician (Oncology)   CHIEF COMPLAINT: Small lymphocytic lymphoma  CURRENT TREATMENT: bendamustine/ rituximab   HISTORY OF PRESENT ILLNESS: From the original intake note:  Maria [name] developed what she thought was "the flu" in December of 2002. She noted a large lymph node developing in her right anterior cervical area. 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   Her subsequent history is as detailed below    INTERVAL HISTORY: [name] returns today for follow-up and treatment of her chronic lymphoid leukemia. She started Rituximab-Bendamustine on 10/22/2018 to be given on a 28 day cycle for her CLL.  She is here today on cycle 1 day 9 to review how she tolerated the treatment.    Since her last visit Maria [name] underwent PET scan on 10/29/2018 that showed an interval progression of the lymphadenopathy in her neck, chest, abdomen, and pelvis.    REVIEW OF SYSTEMS: [name] is doing well today.  She notes that she tolerated treatment well.  She says that she urinated a lot after receiving the IV lasix in the treatment room.  The swelling in her legs has improved.    She was relieved that she didn't develop any issues such as nausea, vomiting, bowel/bladder changes, mucositis.  She notes that she feels bloated.  She denies any new issues such as fevers, chills, rash, headaches, vision changes, chest pain, palpitations, or shortness of breath.    PAST MEDICAL HISTORY: Past Medical History:  Diagnosis Date  . Allergy   . Arthritis   . Asthma   . Cancer [facility])    chronic lymphacytic leukemia  .  CLL (chronic lymphocytic leukemia) ([place])   . Diabetes mellitus    PMH  . Headache    migraine  . Hyperlipidemia   . Hypertension   . Hypothyroidism   . Lymphoma ([place])   . Thyroid disease    hypothyroidism  . Wears glasses     PAST SURGICAL HISTORY: Past Surgical History:  Procedure Laterality Date  . ABDOMINAL HYSTERECTOMY    . AXILLARY LYMPH NODE BIOPSY Right 08/21/2018   Procedure: RIGHT AXILLARY LYMPH NODE BIOPSY ERAS PATHWAY;  Surgeon: [name], MD;  Location: [facility];  Service: General;  Laterality: Right;  LMA  . BREAST EXCISIONAL BIOPSY Right   . COLONOSCOPY    . LYMPH NODE DISSECTION    . STRABISMUS SURGERY    . TONSILLECTOMY    . TOTAL KNEE ARTHROPLASTY Right 12/03/2016   Procedure: RIGHT TOTAL KNEE ARTHROPLASTY;  Surgeon: [name], MD;  Location: [facility];  Service: Orthopedics;  Laterality: Right;    FAMILY HISTORY Family History  Problem Relation Age of Onset  . Liver cancer Mother   . Heart disease Father   . Breast cancer Sister 26  . Colon cancer Neg Hx   . Pancreatic cancer Neg Hx   . Rectal cancer Neg Hx   . Stomach cancer Neg Hx    The patient's father died at the age of 33 from a myocardial infarction. The patient's mother died at the age of 96 from primary liver carcinoma. The patient had no brothers. Her one sister, Maria [name], has a history  of breast cancer   GYNECOLOGIC HISTORY: Menarche at around age 25. The patient is GX P0. 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 She lives by herself with her cats [place] and [place]. She attends a Investment banker, operational.     ADVANCED DIRECTIVES: Not in place  HEALTH MAINTENANCE: Social History   Tobacco Use  . Smoking status: Never Smoker  . Smokeless tobacco: Never Used  Substance Use Topics  . Alcohol use: Yes     Comment: occasional alcohol intake; once or twice monthly  . Drug use: No     Colonoscopy: 06/01/2014/[facility]  PAP:  Bone density:  Lipid panel:  228/114/50/155/ratio 4.6  On 10/01/2016  Allergies  Allergen Reactions  . Iohexol Swelling and Rash  . Compazine [Prochlorperazine Edisylate] Other (See Comments)    Mild confusion  . Contrast Media  [Iodinated Diagnostic Agents]   . Lactose Intolerance (Gi)     Diarrhea, gas bloating  . Lactulose Diarrhea and Other (See Comments)    gas bloating  . Cephalexin Rash    Current Outpatient Medications  Medication Sig Dispense Refill  . acyclovir (ZOVIRAX) 400 MG tablet Take 1 tablet (400 mg total) by mouth daily. 30 tablet 3  . allopurinol (ZYLOPRIM) 300 MG tablet Take 1 tablet (300 mg total) by mouth daily. 30 tablet 3  . dexamethasone (DECADRON) 4 MG tablet Take 2 tablets (8 mg total) by mouth daily. Start the day after bendamustine chemotherapy for 2 days. Take with food. 30 tablet 1  . hydrochlorothiazide (HYDRODIURIL) 12.5 MG tablet Take 12.5 mg by mouth daily.     . ondansetron (ZOFRAN) 8 MG tablet Take 1 tablet (8 mg total) by mouth every 8 (eight) hours as needed for nausea or vomiting. 20 tablet 3  . rosuvastatin (CRESTOR) 10 MG tablet Take 10 mg by mouth at bedtime.  11  . SYNTHROID 88 MCG tablet Take 88 mcg by mouth daily before breakfast.     . zoledronic acid (RECLAST) 5 MG/100ML SOLN injection Inject 5 mg into the vein once. Once a year     No current facility-administered medications for this visit.     OBJECTIVE:  There were no vitals filed for this visit.   There is no height or weight on file to calculate BMI.    ECOG FS: 1  GENERAL: Patient is a well appearing female in no acute distress HEENT:  Sclerae anicteric.  Oropharynx clear and moist. No ulcerations or evidence of oropharyngeal candidiasis. Neck is supple.   NODES:  + cervical lymphadenopathy, axillary lymphadenopathy and inguinal lymphadenopathy noted.      LUNGS:  Clear to auscultation bilaterally.  No wheezes or rhonchi. HEART:  Regular rate and rhythm. No murmur appreciated. ABDOMEN:  Soft, nontender.  Positive, normoactive bowel sounds. + splenomegaly noted. MSK:  No focal spinal tenderness to palpation. Full range of motion bilaterally in the upper extremities. EXTREMITIES:  No peripheral edema.   SKIN:  Clear with no obvious rashes or skin changes. No nail dyscrasia. NEURO:  Nonfocal. Well oriented.  Appropriate affect.      LAB RESULTS:  Lab Results  Component Value Date   WBC 24.2 (H) 10/30/2018   NEUTROABS 5.1 10/30/2018   HGB 13.6 10/30/2018   HCT 42.8 10/30/2018   MCV 88.1 10/30/2018   PLT 145 (L) 10/30/2018  Chemistry      Component Value Date/Time   NA 140 10/30/2018 1408   NA 139 08/05/2017 1353   K 3.7 10/30/2018 1408   K 3.7 08/05/2017 1353   CL 105 10/30/2018 1408   CO2 26 10/30/2018 1408   CO2 27 08/05/2017 1353   BUN 7 (L) 10/30/2018 1408   BUN 17.3 08/05/2017 1353   CREATININE 0.95 10/30/2018 1408   CREATININE 1.0 08/05/2017 1353   GLU 101 (H) 10/18/2009 1542      Component Value Date/Time   CALCIUM 9.2 10/30/2018 1408   CALCIUM 9.2 08/05/2017 1353   ALKPHOS 116 10/30/2018 1408   ALKPHOS 76 08/05/2017 1353   AST 30 10/30/2018 1408   AST 18 08/05/2017 1353   ALT 17 10/30/2018 1408   ALT 14 08/05/2017 1353   BILITOT 1.2 10/30/2018 1408   BILITOT 0.59 08/05/2017 1353       No results found for: LABCA2  No components found for: LABCA125  No results for input(s): INR in the last 168 hours.  Urinalysis    Component Value Date/Time   COLORURINE LT. YELLOW 01/24/2010 1017   APPEARANCEUR CLEAR 01/24/2010 1017   LABSPEC <=1.005 01/24/2010 1017   PHURINE 5.5 01/24/2010 1017   GLUCOSEU NEGATIVE 01/24/2010 [place] 01/24/2010 1017   KETONESUR NEGATIVE 01/24/2010 1017   UROBILINOGEN 0.2 01/24/2010 1017   NITRITE NEGATIVE 01/24/2010 1017   LEUKOCYTESUR NEGATIVE  01/24/2010 1017    STUDIES: Nm Pet Image Restag (ps) Skull Base To Thigh  Result Date: 10/29/2018 CLINICAL DATA:  Subsequent treatment strategy for chronic lymphocytic leukemia. EXAM: NUCLEAR MEDICINE PET SKULL BASE TO THIGH TECHNIQUE: 9.9 mCi F-18 FDG was injected intravenously. Full-ring PET imaging was performed from the skull base to thigh after the radiotracer. CT data was obtained and used for attenuation correction and anatomic localization. Fasting blood glucose: 87 mg/dl COMPARISON:  07/23/2018 FINDINGS: Mediastinal blood pool activity: SUV max 2.4 NECK: Interval progression of hypermetabolic cervical lymphadenopathy. Index left level II node measured previously at 12 mm with SUV max = 3.5 measures similar a 12 mm today but demonstrates SUV max = 5. Right-sided level III lymph node measured previously at 10 mm short axis is now 12 mm. SUV max = 7.4 today compared to 2.4 previously. Similar stable to a minimal increase in size with diffusely increased hypermetabolism noted in the numerous bilateral cervical lymph nodes. Incidental CT findings: none CHEST: Interval progression of hypermetabolism in the bilateral axillary lymphadenopathy. The index low left axillary node measured previously at 1.3 cm short axis with SUV max = 3 now measures 1.5 cm short axis with SUV max = 4.6. Other left axillary lymph nodes demonstrate SUV max up to 7.4. Index lymph node in the right axilla measured previously at 1.1 cm now measures 1.2 cm. SUV max = 8.1 today compared to 2.8 previously. Hypermetabolic lymph nodes are seen in the mediastinum in each hilum. 11 mm short axis subcarinal lymph node has SUV max = 4.7. Incidental CT findings: Coronary artery calcification is evident. Atherosclerotic calcification is noted in the wall of the thoracic aorta. ABDOMEN/PELVIS: No abnormal radiotracer uptake identified within the liver, pancreas, or adrenal glands. SUV max = for the spleen days 3.5 compared to 3.2 previously.  Bulky central abdominal mesenteric lymphadenopathy is again noted. Nodal conglomeration measured previously at 16.5 x 9.3 cm now measures 18.2 x 11.6 cm. SUV max = 8.7 today in this nodal conglomeration compared to 3.9 previously. Similar progression of pelvic  sidewall lymphadenopathy bilaterally. Right pelvic sidewall lymph node measured previously at 2.1 cm is now 3.0 cm. SUV max = 9.4 today compared to 3.4 previously. Incidental CT findings: There is abdominal aortic atherosclerosis without aneurysm. Layering sludge or tiny stones noted in the gallbladder. SKELETON: Mottled marrow uptake identified without definite focal hypermetabolism to suggest metastatic involvement. Incidental CT findings: none IMPRESSION: The extensive lymphadenopathy seen previously in the neck, chest, abdomen, and pelvis shows slight interval increase in size by CT imaging in the neck and chest but more prominent enlargement in the abdomen. This lymphadenopathy shows prominent increase in hypermetabolism with uptake levels now compatible with Deauville 5 criteria. Aortic Atherosclerois (ICD10-170.0) Electronically Signed   By: Misty Stanley M.D.   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 There was no CD10 or cyclin D1 positivity identified  (4) started ibrutinib at 420 mg/ day 08/09/2014  (a) PET scan 07/23/2018 shows progressive adenopathy diffusely  (b) right axillary lymph node core biopsy shows features concerning but not definitive for Darron Doom transformation  (5) evidence of  disease progression November 2019  (a) PET scan 07/23/2018 shows extensive progressive adenopathy.  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  Her WBC have improved from 36k to 24k.  She and I reviewed her PET scan results and the increase in lymphadenopathy which was expected.  She tolerated her treatment well and that is good.    We reviewed the issue of her leg swelling and improvement with Lasix.  I offered to send her in a PRN prescription.  She would like to hold off on this for now, and will call me if she changes her mind.    She will be due to return in 3 weeks for labs, f/u, and her next cycle of treatment.  She knows to call for any other questions or concerns prior to her next appt with [place].     A total of (20) minutes of face-to-face time was spent with this patient with greater than 50% of that time in counseling and care-coordination.  Wilber Bihari, NP  10/30/18 3:16 PM Medical Oncology [facility] [address] [place], [place] [address] Tel. [phone]    Fax. [phone]

## 2018-10-31 LAB — IGG, IGA, IGM
IgA: 55 mg/dL — ABNORMAL LOW (ref 87–352)
IgG (Immunoglobin G), Serum: 304 mg/dL — ABNORMAL LOW (ref 700–1600)
IgM (Immunoglobulin M), Srm: <5 mg/dL — ABNORMAL LOW (ref 26–217)

## 2018-11-02 ENCOUNTER — Other Ambulatory Visit: Payer: Self-pay | Admitting: Oncology

## 2018-11-03 ENCOUNTER — Telehealth: Payer: Self-pay | Admitting: *Deleted

## 2018-11-03 ENCOUNTER — Inpatient Hospital Stay: Payer: Medicare Other

## 2018-11-03 ENCOUNTER — Inpatient Hospital Stay: Payer: Medicare Other | Admitting: Oncology

## 2018-11-03 VITALS — BP 150/63 | HR 85 | Temp 97.3°F | Resp 18 | Ht 62.25 in | Wt 198.7 lb

## 2018-11-03 DIAGNOSIS — Z5112 Encounter for antineoplastic immunotherapy: Secondary | ICD-10-CM | POA: Diagnosis not present

## 2018-11-03 DIAGNOSIS — R252 Cramp and spasm: Secondary | ICD-10-CM | POA: Diagnosis not present

## 2018-11-03 DIAGNOSIS — R52 Pain, unspecified: Secondary | ICD-10-CM

## 2018-11-03 DIAGNOSIS — C9110 Chronic lymphocytic leukemia of B-cell type not having achieved remission: Secondary | ICD-10-CM | POA: Diagnosis not present

## 2018-11-03 LAB — CBC WITH DIFFERENTIAL/PLATELET
Abs Immature Granulocytes: 0 10*3/uL (ref 0.00–0.07)
BASOS PCT: 0 %
Basophils Absolute: 0 10*3/uL (ref 0.0–0.1)
Eosinophils Absolute: 1.1 10*3/uL — ABNORMAL HIGH (ref 0.0–0.5)
Eosinophils Relative: 3 %
HCT: 41.5 % (ref 36.0–46.0)
Hemoglobin: 13.4 g/dL (ref 12.0–15.0)
Lymphocytes Relative: 68 %
Lymphs Abs: 24.3 10*3/uL — ABNORMAL HIGH (ref 0.7–4.0)
MCH: 27.9 pg (ref 26.0–34.0)
MCHC: 32.3 g/dL (ref 30.0–36.0)
MCV: 86.5 fL (ref 80.0–100.0)
Monocytes Absolute: 0 10*3/uL — ABNORMAL LOW (ref 0.1–1.0)
Monocytes Relative: 0 %
Neutro Abs: 10.4 10*3/uL (ref 1.7–17.7)
Neutrophils Relative %: 29 %
Platelets: 137 10*3/uL — ABNORMAL LOW (ref 150–400)
RBC: 4.8 MIL/uL (ref 3.87–5.11)
RDW: 15.7 % — AB (ref 11.5–15.5)
Smear Review: 1
WBC: 35.7 10*3/uL — ABNORMAL HIGH (ref 4.0–10.5)
nRBC: 0.2 % (ref 0.0–0.2)

## 2018-11-03 LAB — COMPREHENSIVE METABOLIC PANEL
ALT: 8 U/L (ref 0–44)
AST: 23 U/L (ref 15–41)
Albumin: 3.8 g/dL (ref 3.5–5.0)
Alkaline Phosphatase: 120 U/L (ref 38–126)
Anion gap: 11 (ref 5–15)
BUN: 11 mg/dL (ref 8–23)
CO2: 24 mmol/L (ref 22–32)
Calcium: 9 mg/dL (ref 8.9–10.3)
Chloride: 100 mmol/L (ref 98–111)
Creatinine, Ser: 1.06 mg/dL — ABNORMAL HIGH (ref 0.44–1.00)
GFR calc Af Amer: >60 mL/min (ref >60)
GFR calc non Af Amer: 54 mL/min — ABNORMAL LOW (ref >60)
Glucose, Bld: 93 mg/dL (ref 70–99)
Potassium: 3.5 mmol/L (ref 3.5–5.1)
SODIUM: 135 mmol/L (ref 135–145)
Total Bilirubin: 1 mg/dL (ref 0.3–1.2)
Total Protein: 5.8 g/dL — ABNORMAL LOW (ref 6.5–8.1)

## 2018-11-03 LAB — LACTATE DEHYDROGENASE: LDH: 841 U/L — AB (ref 98–192)

## 2018-11-03 MED ORDER — DEXAMETHASONE 4 MG PO TABS: 40.0000 mg | ORAL_TABLET | Freq: Every day | ORAL | 0 refills | Status: DC

## 2018-11-03 MED ORDER — OXYCODONE-ACETAMINOPHEN 5-325 MG PO TABS: 1.0000 | ORAL_TABLET | Freq: Four times a day (QID) | ORAL | 0 refills | Status: DC | PRN

## 2018-11-03 MED ORDER — PROCHLORPERAZINE MALEATE 10 MG PO TABS: 10.0000 mg | ORAL_TABLET | Freq: Four times a day (QID) | ORAL | 6 refills | Status: DC | PRN

## 2018-11-03 MED ORDER — ACYCLOVIR 400 MG PO TABS: 400.0000 mg | ORAL_TABLET | Freq: Two times a day (BID) | ORAL | 3 refills | Status: DC

## 2018-11-03 MED ORDER — SULFAMETHOXAZOLE-TRIMETHOPRIM 800-160 MG PO TABS: 1.0000 | ORAL_TABLET | ORAL | 5 refills | Status: DC

## 2018-11-03 MED ORDER — LIDOCAINE-PRILOCAINE 2.5-2.5 % EX CREA: TOPICAL_CREAM | CUTANEOUS | 3 refills | Status: DC

## 2018-11-03 MED ORDER — LORAZEPAM 0.5 MG PO TABS: 0.5000 mg | ORAL_TABLET | Freq: Every evening | ORAL | 0 refills | Status: DC | PRN

## 2018-11-03 MED ORDER — LORAZEPAM 0.5 MG PO TABS: 0.5000 mg | ORAL_TABLET | Freq: Three times a day (TID) | ORAL | 0 refills | Status: DC

## 2018-11-03 NOTE — Progress Notes (Signed)
[facility]  [phone] [phone]    ID: ARDEAN SIMONICH   DOB: [birth year]  MR#: [id number]  JYN#:[id number]  Patient Care Team: Marton Redwood, MD as PCP - General (Internal Medicine) [name], MD as Consulting Physician (Orthopedic Surgery) Caleb Decock, [name] Dad, MD as Consulting Physician (Oncology)   CHIEF COMPLAINT: Small lymphocytic lymphoma  CURRENT TREATMENT: CHOP chemotherapy/obinutuzumab   HISTORY OF PRESENT ILLNESS: From the original intake note:  [name] developed what she thought was "the flu" in December of 2002. She noted a large lymph node developing in her right anterior cervical area. 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   Her subsequent history is as detailed below    INTERVAL HISTORY: Karalyne returns today for follow-up and treatment of her chronic lymphoid leukemia.   She will discontinue bendamustine and rituximab due to evidence of disease progression. Today is day 12 cycle 1.   Her last restaging PET was completed on 10/29/2018 showing The extensive lymphadenopathy seen previously in the neck, chest, abdomen, and pelvis shows slight interval increase in size by CT imaging in the neck and chest but more prominent enlargement in the abdomen. This lymphadenopathy shows prominent increase in hypermetabolism with uptake levels now compatible with Deauville 5 Criteria. Aortic Atherosclerois (ICD10-170.0)   REVIEW OF SYSTEMS: Bunnie says that she is feeling horribly. Her back has been hurting and her stomach is been distended. She has been having a difficult time laying down on 10/31/2018, then by 11/01/2018 it just got progressively worse. She said on 11/02/2018, she got about an hour of sleep due to the discomfort. She noes that her nodes have "gotten larger and merged in together." She has tried Tylenol with no relief. Her abdomen is not  not just distended, but it's also painful. She notes that she hasn't been urinating as much as she was. She has been taking Miralax. Her appetite is decreased because she fills up fast, but she is making sure to drink water. She has always been a big coffee drinker, but now she has lost the taste for it. She is running a subjectively low-grade fever with some mild diaphoresis that she describes as uncomfortable. The patient denies unusual headaches, visual changes, nausea, vomiting, or dizziness. There has been no unusual cough, phlegm production, or pleurisy. The patient denies unexplained weight loss or bleeding. A detailed review of systems was otherwise noncontributory.    PAST MEDICAL HISTORY: Past Medical History:  Diagnosis Date  . Allergy   . Arthritis   . Asthma   . Cancer [facility])    chronic lymphacytic leukemia  . CLL (chronic lymphocytic leukemia) ([place])   . Diabetes mellitus    PMH  . Headache    migraine  . Hyperlipidemia   . Hypertension   . Hypothyroidism   . Lymphoma ([place])   . Thyroid disease    hypothyroidism  . Wears glasses     PAST SURGICAL HISTORY: Past Surgical History:  Procedure Laterality Date  . ABDOMINAL HYSTERECTOMY    . AXILLARY LYMPH NODE BIOPSY Right 08/21/2018   Procedure: RIGHT AXILLARY LYMPH NODE BIOPSY ERAS PATHWAY;  Surgeon: [name], MD;  Location: [place];  Service: General;  Laterality: Right;  LMA  . BREAST EXCISIONAL BIOPSY Right   . COLONOSCOPY    . LYMPH NODE DISSECTION    . STRABISMUS SURGERY    . TONSILLECTOMY    . TOTAL KNEE ARTHROPLASTY Right  12/03/2016   Procedure: RIGHT TOTAL KNEE ARTHROPLASTY;  Surgeon: [name], MD;  Location: [facility];  Service: Orthopedics;  Laterality: Right;    FAMILY HISTORY: Family History  Problem Relation Age of Onset  . Liver cancer Mother   . Heart disease Father   . Breast cancer Sister 98  . Colon cancer Neg Hx   . Pancreatic cancer Neg Hx   . Rectal cancer Neg Hx   . Stomach cancer  Neg Hx    The patient's father died at the age of 13 from a myocardial infarction. The patient's mother died at the age of 57 from primary liver carcinoma. The patient had no brothers. Her one sister, [name], has a history of breast cancer   GYNECOLOGIC HISTORY: Menarche at around age 68. The patient is GX P0. 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 She lives by herself with her cats [place] and [place]. She attends a Investment banker, operational.   ADVANCED DIRECTIVES: Not in place   HEALTH MAINTENANCE: Social History   Tobacco Use  . Smoking status: Never Smoker  . Smokeless tobacco: Never Used  Substance Use Topics  . Alcohol use: Yes    Comment: occasional alcohol intake; once or twice monthly  . Drug use: No     Colonoscopy: 06/01/2014/[facility]  PAP:  Bone density:  Lipid panel:  228/114/50/155/ratio 4.6  On 10/01/2016  Allergies  Allergen Reactions  . Iohexol Swelling and Rash  . Compazine [Prochlorperazine Edisylate] Other (See Comments)    Mild confusion  . Contrast Media  [Iodinated Diagnostic Agents]   . Lactose Intolerance (Gi)     Diarrhea, gas bloating  . Lactulose Diarrhea and Other (See Comments)    gas bloating  . Cephalexin Rash    Current Outpatient Medications  Medication Sig Dispense Refill  . dexamethasone (DECADRON) 4 MG tablet Take 10 tablets (40 mg total) by mouth daily. 40 tablet 0  . hydrochlorothiazide (HYDRODIURIL) 12.5 MG tablet Take 12.5 mg by mouth daily.     Marland Kitchen lidocaine-prilocaine (EMLA) cream Apply to affected area once 30 g 3  . LORazepam (ATIVAN) 0.5 MG tablet Take 1 tablet (0.5 mg total) by mouth at bedtime as needed (Nausea or vomiting). 30 tablet 0  . ondansetron (ZOFRAN) 8 MG tablet Take 1 tablet (8 mg total) by mouth every 8 (eight) hours as needed for nausea  or vomiting. 20 tablet 3  . oxyCODONE-acetaminophen (PERCOCET/ROXICET) 5-325 MG tablet Take 1-2 tablets by mouth every 6 (six) hours as needed for severe pain. 30 tablet 0  . oxyCODONE-acetaminophen (PERCOCET/ROXICET) 5-325 MG tablet Take 1-2 tablets by mouth every 6 (six) hours as needed for severe pain. 30 tablet 0  . prochlorperazine (COMPAZINE) 10 MG tablet Take 1 tablet (10 mg total) by mouth every 6 (six) hours as needed (Nausea or vomiting). 30 tablet 6  . rosuvastatin (CRESTOR) 10 MG tablet Take 10 mg by mouth at bedtime.  11  . sulfamethoxazole-trimethoprim (BACTRIM DS,SEPTRA DS) 800-160 MG tablet Take 1 tablet by mouth 3 (three) times a week. 12 tablet 5  . SYNTHROID 88 MCG tablet Take 88 mcg by mouth daily before breakfast.     . zoledronic acid (RECLAST) 5 MG/100ML SOLN injection Inject 5 mg into the vein once. Once a year     No current facility-administered medications for this  visit.     OBJECTIVE: Middle-aged white woman   Vitals:   11/03/18 1535  BP: (!) 150/63  Pulse: 85  Resp: 18  Temp: (!) 97.3 F (36.3 C)  SpO2: 98%     Body mass index is 36.05 kg/m.    ECOG FS: 1  Sclerae unicteric, EOMs intact Oropharynx clear and moist Large matted bilateral cervical adenopathy left greater than right, bilateral axillary adenopathy Lungs no rales or rhonchi Heart regular rate and rhythm Abd soft, distended, uncomfortable to palpation without rebound MSK no focal spinal tenderness Neuro: nonfocal, well oriented, appropriate affect Breasts: Deferred       LAB RESULTS:  Lab Results  Component Value Date   WBC 35.7 (H) 11/03/2018   NEUTROABS 10.4 11/03/2018   HGB 13.4 11/03/2018   HCT 41.5 11/03/2018   MCV 86.5 11/03/2018   PLT 137 (L) 11/03/2018      Chemistry      Component Value Date/Time   NA 135 11/03/2018 1517   NA 139 08/05/2017 1353   K 3.5 11/03/2018 1517   K 3.7 08/05/2017 1353   CL 100 11/03/2018 1517   CO2 24 11/03/2018 1517   CO2 27  08/05/2017 1353   BUN 11 11/03/2018 1517   BUN 17.3 08/05/2017 1353   CREATININE 1.06 (H) 11/03/2018 1517   CREATININE 1.0 08/05/2017 1353   GLU 101 (H) 10/18/2009 1542      Component Value Date/Time   CALCIUM 9.0 11/03/2018 1517   CALCIUM 9.2 08/05/2017 1353   ALKPHOS 120 11/03/2018 1517   ALKPHOS 76 08/05/2017 1353   AST 23 11/03/2018 1517   AST 18 08/05/2017 1353   ALT 8 11/03/2018 1517   ALT 14 08/05/2017 1353   BILITOT 1.0 11/03/2018 1517   BILITOT 0.59 08/05/2017 1353       No results found for: LABCA2  No components found for: LABCA125  No results for input(s): INR in the last 168 hours.  Urinalysis    Component Value Date/Time   COLORURINE LT. YELLOW 01/24/2010 1017   APPEARANCEUR CLEAR 01/24/2010 1017   LABSPEC <=1.005 01/24/2010 1017   PHURINE 5.5 01/24/2010 1017   GLUCOSEU NEGATIVE 01/24/2010 [place] 01/24/2010 1017   KETONESUR NEGATIVE 01/24/2010 1017   UROBILINOGEN 0.2 01/24/2010 1017   NITRITE NEGATIVE 01/24/2010 1017   LEUKOCYTESUR NEGATIVE 01/24/2010 1017    STUDIES: Nm Pet Image Restag (ps) Skull Base To Thigh  Result Date: 10/29/2018 CLINICAL DATA:  Subsequent treatment strategy for chronic lymphocytic leukemia. EXAM: NUCLEAR MEDICINE PET SKULL BASE TO THIGH TECHNIQUE: 9.9 mCi F-18 FDG was injected intravenously. Full-ring PET imaging was performed from the skull base to thigh after the radiotracer. CT data was obtained and used for attenuation correction and anatomic localization. Fasting blood glucose: 87 mg/dl COMPARISON:  07/23/2018 FINDINGS: Mediastinal blood pool activity: SUV max 2.4 NECK: Interval progression of hypermetabolic cervical lymphadenopathy. Index left level II node measured previously at 12 mm with SUV max = 3.5 measures similar a 12 mm today but demonstrates SUV max = 5. Right-sided level III lymph node measured previously at 10 mm short axis is now 12 mm. SUV max = 7.4 today compared to 2.4 previously. Similar  stable to a minimal increase in size with diffusely increased hypermetabolism noted in the numerous bilateral cervical lymph nodes. Incidental CT findings: none CHEST: Interval progression of hypermetabolism in the bilateral axillary lymphadenopathy. The index low left axillary node measured previously at 1.3 cm short axis with SUV max =  3 now measures 1.5 cm short axis with SUV max = 4.6. Other left axillary lymph nodes demonstrate SUV max up to 7.4. Index lymph node in the right axilla measured previously at 1.1 cm now measures 1.2 cm. SUV max = 8.1 today compared to 2.8 previously. Hypermetabolic lymph nodes are seen in the mediastinum in each hilum. 11 mm short axis subcarinal lymph node has SUV max = 4.7. Incidental CT findings: Coronary artery calcification is evident. Atherosclerotic calcification is noted in the wall of the thoracic aorta. ABDOMEN/PELVIS: No abnormal radiotracer uptake identified within the liver, pancreas, or adrenal glands. SUV max = for the spleen days 3.5 compared to 3.2 previously. Bulky central abdominal mesenteric lymphadenopathy is again noted. Nodal conglomeration measured previously at 16.5 x 9.3 cm now measures 18.2 x 11.6 cm. SUV max = 8.7 today in this nodal conglomeration compared to 3.9 previously. Similar progression of pelvic sidewall lymphadenopathy bilaterally. Right pelvic sidewall lymph node measured previously at 2.1 cm is now 3.0 cm. SUV max = 9.4 today compared to 3.4 previously. Incidental CT findings: There is abdominal aortic atherosclerosis without aneurysm. Layering sludge or tiny stones noted in the gallbladder. SKELETON: Mottled marrow uptake identified without definite focal hypermetabolism to suggest metastatic involvement. Incidental CT findings: none IMPRESSION: The extensive lymphadenopathy seen previously in the neck, chest, abdomen, and pelvis shows slight interval increase in size by CT imaging in the neck and chest but more prominent enlargement in the  abdomen. This lymphadenopathy shows prominent increase in hypermetabolism with uptake levels now compatible with Deauville 5 criteria. Aortic Atherosclerois (ICD10-170.0) Electronically Signed   By: Misty Stanley M.D.   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 There was no CD10 or cyclin D1 positivity identified  (4) started ibrutinib at 420 mg/ day 08/09/2014  (a) PET scan 07/23/2018 shows progressive adenopathy diffusely  (b) right axillary lymph node core biopsy shows features concerning but not definitive for Darron Doom transformation  (5) evidence of disease progression November 2019  (a) PET scan 07/23/2018 shows extensive progressive adenopathy.  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  Her abdomen is distended and uncomfortable.  She is having regular bowel movements and no problems with urination so there is no evidence of obstruction at present.  However this is likely to develop if we do not intervene fairly rapidly.  She is now only 12 days out from bendamustine.  I do not  think that we can treat her with chemotherapy at this point.  Accordingly she will receive dexamethasone 40 mg daily for the next 4 days, beginning 11/04/2018.  She will then receive obinutuzumab next week, for her initial dose, split over 2 days because of the risk of first dose reaction.  Then on 11/19/2018 we will start CHOP chemotherapy and of course she will receive obinutuzumab at that time as well.  The overall plan is for debulking, bringing her lymphocyte count and adenopathy down, so that we can start venetoclax, most likely together with obinutuzumab on an outpatient basis  She has a good understanding of the overall plan.  She will need a port and an echocardiogram and those orders have been placed.  I am increasing her acyclovir to twice daily and added Septra 3 times a week as she will be significantly immunocompromised.  For that reason also I have added Udenyca to her CHOP chemotherapy.  Finally I wrote for lorazepam and oxycodone today to bring her pain and back spasms under better control.  She knows to call for any other issues that may develop before the next visit.   Kory Rains, [name] Dad, MD  11/03/18 5:55 PM Medical Oncology and Hematology [facility] [address] [place], [place] [address] Tel. [phone]    Fax. [phone]  I, Jacqualyn Posey am acting as a Education administrator for Chauncey Cruel, MD.   I, Lurline Del MD, have reviewed the above documentation for accuracy and completeness, and I agree with the above.

## 2018-11-03 NOTE — Progress Notes (Signed)
DISCONTINUE OFF PATHWAY REGIMEN - Lymphoma and CLL   [id number]:Bendamustine + Rituximab (70/500) q28 Days:   A cycle is every 28 days:     Bendamustine      Rituximab      Rituximab   **Always confirm dose/schedule in your pharmacy ordering system**  REASON: Disease Progression PRIOR TREATMENT: Off Pathway: Bendamustine + Rituximab (70/500) q28 Days TREATMENT RESPONSE: Progressive Disease (PD)  START OFF PATHWAY REGIMEN - Lymphoma and CLL   [id number]:CHOP q21 days:   A cycle is every 21 days:     Cyclophosphamide      Doxorubicin      Vincristine      Prednisone   **Always confirm dose/schedule in your pharmacy ordering system**  Patient Characteristics: Small Lymphocytic Lymphoma (SLL), Third Line, 17p del (-) or Unknown Disease Type: Small Lymphocytic Lymphoma (SLL) Disease Type: Not Applicable Disease Type: Not Applicable [place] Stage: IV Line of Therapy: Third Line 17p Deletion Status: Unknown Intent of Therapy: Non-Curative / Palliative Intent, Discussed with Patient

## 2018-11-03 NOTE — Telephone Encounter (Signed)
This RN returned pt's VM per her concerns of :  1. abd discomfort - " feel distended and painful "  2. Increased low back pain.  3. " have not slept for the last 3 days "  Pt stated " I just cannot do this and feel this way".  Note pt started a new chemo regimen last week due to disease progression.  Corlene states she is not constipated and is has been having daily small BM's with use of miralax.  Per above discussion- and review with MD - appointment made for lab and visit today.

## 2018-11-04 ENCOUNTER — Telehealth: Payer: Self-pay | Admitting: Oncology

## 2018-11-04 ENCOUNTER — Telehealth: Payer: Self-pay | Admitting: Adult Health

## 2018-11-04 LAB — IGG, IGA, IGM
IgA: 58 mg/dL — ABNORMAL LOW (ref 87–352)
IgG (Immunoglobin G), Serum: 301 mg/dL — ABNORMAL LOW (ref 700–1600)
IgM (Immunoglobulin M), Srm: <5 mg/dL — ABNORMAL LOW (ref 26–217)

## 2018-11-04 NOTE — Telephone Encounter (Signed)
Called pt and left message with ECHO appt .

## 2018-11-04 NOTE — Telephone Encounter (Signed)
Called regarding 3/3 and 3/4

## 2018-11-06 ENCOUNTER — Telehealth: Payer: Self-pay

## 2018-11-06 ENCOUNTER — Other Ambulatory Visit: Payer: Self-pay | Admitting: Radiology

## 2018-11-06 MED ORDER — FUROSEMIDE 20 MG PO TABS: 20.0000 mg | ORAL_TABLET | Freq: Every day | ORAL | 0 refills | Status: DC

## 2018-11-06 MED ORDER — POTASSIUM CHLORIDE CRYS ER 20 MEQ PO TBCR: 20.0000 meq | EXTENDED_RELEASE_TABLET | Freq: Every day | ORAL | 0 refills | Status: DC

## 2018-11-06 NOTE — Telephone Encounter (Signed)
Pt called to inform that she has had significant swelling in BLE. Denies shortness of breath.  No history of CHF or hypertension.  Pt has tried elevation with minimal relief.    Nurse reviewed with MD.  Orders given for Lasix 20mg  daily, and Potassium 20 mEq daily while on diuretic. Patient voiced understanding and agreement.  No further needs.

## 2018-11-07 ENCOUNTER — Ambulatory Visit (HOSPITAL_COMMUNITY)
Admission: RE | Admit: 2018-11-07 | Discharge: 2018-11-07 | Disposition: A | Discharge status: Home / Self Care | Payer: Medicare Other | Source: Ambulatory Visit | Attending: Oncology | Admitting: Oncology

## 2018-11-07 ENCOUNTER — Ambulatory Visit: Payer: Medicare Other | Admitting: Oncology

## 2018-11-07 ENCOUNTER — Other Ambulatory Visit: Payer: Self-pay | Admitting: Radiology

## 2018-11-07 ENCOUNTER — Other Ambulatory Visit: Payer: Medicare Other

## 2018-11-07 DIAGNOSIS — I10 Essential (primary) hypertension: Secondary | ICD-10-CM | POA: Insufficient documentation

## 2018-11-07 DIAGNOSIS — E785 Hyperlipidemia, unspecified: Secondary | ICD-10-CM | POA: Diagnosis not present

## 2018-11-07 DIAGNOSIS — E119 Type 2 diabetes mellitus without complications: Secondary | ICD-10-CM | POA: Diagnosis not present

## 2018-11-07 DIAGNOSIS — C9110 Chronic lymphocytic leukemia of B-cell type not having achieved remission: Secondary | ICD-10-CM | POA: Diagnosis not present

## 2018-11-07 DIAGNOSIS — I358 Other nonrheumatic aortic valve disorders: Secondary | ICD-10-CM | POA: Diagnosis not present

## 2018-11-07 NOTE — Progress Notes (Signed)
  Echocardiogram 2D Echocardiogram has been performed.  Maria [name] 11/07/2018, 12:13 PM

## 2018-11-10 ENCOUNTER — Other Ambulatory Visit: Payer: Self-pay | Admitting: Oncology

## 2018-11-10 ENCOUNTER — Encounter (HOSPITAL_COMMUNITY): Payer: Self-pay

## 2018-11-10 ENCOUNTER — Other Ambulatory Visit: Payer: Self-pay

## 2018-11-10 ENCOUNTER — Ambulatory Visit (HOSPITAL_COMMUNITY)
Admission: RE | Admit: 2018-11-10 | Discharge: 2018-11-10 | Disposition: A | Discharge status: Home / Self Care | Payer: Medicare Other | Source: Ambulatory Visit | Attending: Oncology | Admitting: Oncology

## 2018-11-10 DIAGNOSIS — M1990 Unspecified osteoarthritis, unspecified site: Secondary | ICD-10-CM | POA: Diagnosis not present

## 2018-11-10 DIAGNOSIS — I10 Essential (primary) hypertension: Secondary | ICD-10-CM | POA: Diagnosis not present

## 2018-11-10 DIAGNOSIS — C9110 Chronic lymphocytic leukemia of B-cell type not having achieved remission: Secondary | ICD-10-CM

## 2018-11-10 DIAGNOSIS — Z90710 Acquired absence of both cervix and uterus: Secondary | ICD-10-CM | POA: Insufficient documentation

## 2018-11-10 DIAGNOSIS — Z79890 Hormone replacement therapy (postmenopausal): Secondary | ICD-10-CM | POA: Diagnosis not present

## 2018-11-10 DIAGNOSIS — Z91041 Radiographic dye allergy status: Secondary | ICD-10-CM | POA: Diagnosis not present

## 2018-11-10 DIAGNOSIS — Z881 Allergy status to other antibiotic agents status: Secondary | ICD-10-CM | POA: Insufficient documentation

## 2018-11-10 DIAGNOSIS — Z809 Family history of malignant neoplasm, unspecified: Secondary | ICD-10-CM | POA: Diagnosis not present

## 2018-11-10 DIAGNOSIS — Z8249 Family history of ischemic heart disease and other diseases of the circulatory system: Secondary | ICD-10-CM | POA: Insufficient documentation

## 2018-11-10 DIAGNOSIS — E785 Hyperlipidemia, unspecified: Secondary | ICD-10-CM | POA: Insufficient documentation

## 2018-11-10 DIAGNOSIS — Z888 Allergy status to other drugs, medicaments and biological substances status: Secondary | ICD-10-CM | POA: Diagnosis not present

## 2018-11-10 DIAGNOSIS — Z803 Family history of malignant neoplasm of breast: Secondary | ICD-10-CM | POA: Diagnosis not present

## 2018-11-10 DIAGNOSIS — Z79899 Other long term (current) drug therapy: Secondary | ICD-10-CM | POA: Insufficient documentation

## 2018-11-10 DIAGNOSIS — E079 Disorder of thyroid, unspecified: Secondary | ICD-10-CM | POA: Diagnosis not present

## 2018-11-10 DIAGNOSIS — E119 Type 2 diabetes mellitus without complications: Secondary | ICD-10-CM | POA: Insufficient documentation

## 2018-11-10 HISTORY — PX: IR IMAGING GUIDED PORT INSERTION: IMG5740

## 2018-11-10 LAB — PROTIME-INR
INR: 0.9 (ref 0.8–1.2)
Prothrombin Time: 12.2 seconds (ref 11.4–15.2)

## 2018-11-10 LAB — BASIC METABOLIC PANEL
Anion gap: 10 (ref 5–15)
BUN: 30 mg/dL — ABNORMAL HIGH (ref 8–23)
CO2: 26 mmol/L (ref 22–32)
Calcium: 8.9 mg/dL (ref 8.9–10.3)
Chloride: 96 mmol/L — ABNORMAL LOW (ref 98–111)
Creatinine, Ser: 0.96 mg/dL (ref 0.44–1.00)
GFR calc Af Amer: >60 mL/min (ref >60)
GFR calc non Af Amer: >60 mL/min (ref >60)
GLUCOSE: 186 mg/dL — AB (ref 70–99)
Potassium: 3.8 mmol/L (ref 3.5–5.1)
Sodium: 132 mmol/L — ABNORMAL LOW (ref 135–145)

## 2018-11-10 LAB — CBC WITH DIFFERENTIAL/PLATELET
ABS IMMATURE GRANULOCYTES: 0.26 10*3/uL — AB (ref 0.00–0.07)
Basophils Absolute: 0 10*3/uL (ref 0.0–0.1)
Basophils Relative: 0 %
Eosinophils Absolute: 0 10*3/uL (ref 0.0–0.5)
Eosinophils Relative: 0 %
HCT: 42.7 % (ref 36.0–46.0)
Hemoglobin: 13.8 g/dL (ref 12.0–15.0)
Immature Granulocytes: 1 %
Lymphocytes Relative: 31 %
Lymphs Abs: 13.1 10*3/uL — ABNORMAL HIGH (ref 0.7–4.0)
MCH: 28.1 pg (ref 26.0–34.0)
MCHC: 32.3 g/dL (ref 30.0–36.0)
MCV: 87 fL (ref 80.0–100.0)
MONOS PCT: 48 %
Monocytes Absolute: 20.4 10*3/uL — ABNORMAL HIGH (ref 0.1–1.0)
Neutro Abs: 8.2 10*3/uL — ABNORMAL HIGH (ref 1.7–7.7)
Neutrophils Relative %: 20 %
Platelets: 167 10*3/uL (ref 150–400)
RBC: 4.91 MIL/uL (ref 3.87–5.11)
RDW: 15.9 % — ABNORMAL HIGH (ref 11.5–15.5)
WBC: 42 10*3/uL — ABNORMAL HIGH (ref 4.0–10.5)
nRBC: 0.1 % (ref 0.0–0.2)

## 2018-11-10 MED ORDER — CLINDAMYCIN PHOSPHATE 900 MG/50ML IV SOLN
INTRAVENOUS | Status: AC
  Administered 2018-11-10: 900 mg via INTRAVENOUS
  Filled 2018-11-10: qty 50

## 2018-11-10 MED ORDER — ALLOPURINOL 300 MG PO TABS: 300.0000 mg | ORAL_TABLET | Freq: Every day | ORAL | 4 refills | Status: DC

## 2018-11-10 MED ORDER — LIDOCAINE-EPINEPHRINE (PF) 2 %-1:200000 IJ SOLN
INTRAMUSCULAR | Status: AC
  Filled 2018-11-10: qty 20

## 2018-11-10 MED ORDER — LIDOCAINE-EPINEPHRINE (PF) 2 %-1:200000 IJ SOLN
INTRAMUSCULAR | Status: AC | PRN
  Administered 2018-11-10 (×2): 10 mL

## 2018-11-10 MED ORDER — HEPARIN SOD (PORK) LOCK FLUSH 100 UNIT/ML IV SOLN
INTRAVENOUS | Status: AC | PRN
  Administered 2018-11-10: 500 [IU]

## 2018-11-10 MED ORDER — FENTANYL CITRATE (PF) 100 MCG/2ML IJ SOLN
INTRAMUSCULAR | Status: AC | PRN
  Administered 2018-11-10 (×2): 50 ug via INTRAVENOUS

## 2018-11-10 MED ORDER — MIDAZOLAM HCL 2 MG/2ML IJ SOLN
INTRAMUSCULAR | Status: AC | PRN
  Administered 2018-11-10 (×2): 1 mg via INTRAVENOUS

## 2018-11-10 MED ORDER — MIDAZOLAM HCL 2 MG/2ML IJ SOLN
INTRAMUSCULAR | Status: AC
  Filled 2018-11-10: qty 2

## 2018-11-10 MED ORDER — HEPARIN SOD (PORK) LOCK FLUSH 100 UNIT/ML IV SOLN
INTRAVENOUS | Status: AC
  Filled 2018-11-10: qty 5

## 2018-11-10 MED ORDER — SODIUM CHLORIDE 0.9 % IV SOLN
INTRAVENOUS | Status: DC
  Administered 2018-11-10: 13:00:00 via INTRAVENOUS

## 2018-11-10 MED ORDER — CLINDAMYCIN PHOSPHATE 900 MG/50ML IV SOLN
900.0000 mg | Freq: Once | INTRAVENOUS | Status: AC
  Administered 2018-11-10: 900 mg via INTRAVENOUS

## 2018-11-10 MED ORDER — FENTANYL CITRATE (PF) 100 MCG/2ML IJ SOLN
INTRAMUSCULAR | Status: AC
  Filled 2018-11-10: qty 2

## 2018-11-10 NOTE — Procedures (Signed)
Interventional Radiology Procedure Note  Procedure: Placement of a right IJ approach single lumen PowerPort.  Tip is positioned at the superior cavoatrial junction and catheter is ready for immediate use.  Complications: No immediate Recommendations:  - Ok to shower tomorrow - Do not submerge for 7 days - Routine line care   Signed,  Heath K. McCullough, MD   

## 2018-11-10 NOTE — Consult Note (Signed)
Chief Complaint: Patient was seen in consultation today for Port-A-Cath placement  Referring Physician(s): Chauncey Cruel  Supervising Physician: Jacqulynn Cadet  Patient Status: [facility] - Out-pt  History of Present Illness: Maria [name] is a 68 y.o. female with history of small lymphocytic lymphoma/CLL initially diagnosed in 2003, s/p chemotherapy. She now presents with disease progression and is scheduled for port a cath placement for additional treatment.   Past Medical History:  Diagnosis Date  . Allergy   . Arthritis   . Asthma   . Cancer [facility])    chronic lymphacytic leukemia  . CLL (chronic lymphocytic leukemia) ([place])   . Diabetes mellitus    PMH  . Headache    migraine  . Hyperlipidemia   . Hypertension   . Hypothyroidism   . Lymphoma ([place])   . Thyroid disease    hypothyroidism  . Wears glasses     Past Surgical History:  Procedure Laterality Date  . ABDOMINAL HYSTERECTOMY    . AXILLARY LYMPH NODE BIOPSY Right 08/21/2018   Procedure: RIGHT AXILLARY LYMPH NODE BIOPSY ERAS PATHWAY;  Surgeon: [name], MD;  Location: [facility];  Service: General;  Laterality: Right;  LMA  . BREAST EXCISIONAL BIOPSY Right   . COLONOSCOPY    . LYMPH NODE DISSECTION    . STRABISMUS SURGERY    . TONSILLECTOMY    . TOTAL KNEE ARTHROPLASTY Right 12/03/2016   Procedure: RIGHT TOTAL KNEE ARTHROPLASTY;  Surgeon: [name], MD;  Location: [facility];  Service: Orthopedics;  Laterality: Right;    Allergies: Iohexol; Compazine [prochlorperazine edisylate]; Contrast media  [iodinated diagnostic agents]; Lactose intolerance (gi); Lactulose; and Cephalexin  Medications: Prior to Admission medications   Medication Sig Start Date End Date Taking? Authorizing Provider  dexamethasone (DECADRON) 4 MG tablet Take 10 tablets (40 mg total) by mouth daily. 11/03/18   [name], [name] Dad, MD  furosemide (LASIX) 20 MG tablet Take 1 tablet (20 mg total) by mouth daily. 11/06/18    [name], [name] Dad, MD  hydrochlorothiazide (HYDRODIURIL) 12.5 MG tablet Take 12.5 mg by mouth daily.     [provider]  lidocaine-prilocaine (EMLA) cream Apply to affected area once 11/03/18   [name], [name] Dad, MD  LORazepam (ATIVAN) 0.5 MG tablet Take 1 tablet (0.5 mg total) by mouth at bedtime as needed (Nausea or vomiting). 11/03/18   [name], [name] Dad, MD  ondansetron (ZOFRAN) 8 MG tablet Take 1 tablet (8 mg total) by mouth every 8 (eight) hours as needed for nausea or vomiting. 10/23/18   [name], [name] Dad, MD  oxyCODONE-acetaminophen (PERCOCET/ROXICET) 5-325 MG tablet Take 1-2 tablets by mouth every 6 (six) hours as needed for severe pain. 11/03/18   [name], [name] Dad, MD  oxyCODONE-acetaminophen (PERCOCET/ROXICET) 5-325 MG tablet Take 1-2 tablets by mouth every 6 (six) hours as needed for severe pain. 11/03/18   [name], [name] Dad, MD  potassium chloride SA (K-DUR,KLOR-CON) 20 MEQ tablet Take 1 tablet (20 mEq total) by mouth daily. 11/06/18   [name], [name] Dad, MD  prochlorperazine (COMPAZINE) 10 MG tablet Take 1 tablet (10 mg total) by mouth every 6 (six) hours as needed (Nausea or vomiting). 11/03/18   [name], [name] Dad, MD  rosuvastatin (CRESTOR) 10 MG tablet Take 10 mg by mouth at bedtime. 07/13/18   [provider]  sulfamethoxazole-trimethoprim (BACTRIM DS,SEPTRA DS) 800-160 MG tablet Take 1 tablet by mouth 3 (three) times a week. 11/03/18   [name], [name] Dad, MD  SYNTHROID 88 MCG tablet Take 88 mcg by  mouth daily before breakfast.  02/22/11   [provider]  zoledronic acid (RECLAST) 5 MG/100ML SOLN injection Inject 5 mg into the vein once. Once a year    [provider]  allopurinol (ZYLOPRIM) 300 MG tablet Take 1 tablet (300 mg total) by mouth daily. 10/03/18 11/03/18  [name], [name] Dad, MD     Family History  Problem Relation Age of Onset  . Liver cancer Mother   . Heart disease Father   . Breast cancer Sister 59  . Colon cancer Neg Hx   .  Pancreatic cancer Neg Hx   . Rectal cancer Neg Hx   . Stomach cancer Neg Hx     Social History   Socioeconomic History  . Marital status: Single    Spouse name: Not on file  . Number of children: Not on file  . Years of education: Not on file  . Highest education level: Not on file  Occupational History  . Not on file  Social Needs  . Financial resource strain: Not on file  . Food insecurity:    Worry: Not on file    Inability: Not on file  . Transportation needs:    Medical: Not on file    Non-medical: Not on file  Tobacco Use  . Smoking status: Never Smoker  . Smokeless tobacco: Never Used  Substance and Sexual Activity  . Alcohol use: Yes    Comment: occasional alcohol intake; once or twice monthly  . Drug use: No  . Sexual activity: Not on file  Lifestyle  . Physical activity:    Days per week: Not on file    Minutes per session: Not on file  . Stress: Not on file  Relationships  . Social connections:    Talks on phone: Not on file    Gets together: Not on file    Attends religious service: Not on file    Active member of club or organization: Not on file    Attends meetings of clubs or organizations: Not on file    Relationship status: Not on file  Other Topics Concern  . Not on file  Social History Narrative  . Not on file      Review of Systems denies fever, worsening dyspnea, abd pain,N/V or bleeding; does have occ HA, occ cough, some abd fullness, occ back pain  Vital Signs: pend   Physical Exam patient awake, alert.  Chest clear to auscultation bilaterally;  heart regular rate and rhythm.  Abdomen soft, positive bowel sounds, nontender.  Bilateral lower extremity edema, approximately 2+  Imaging:  Nm Pet Image Restag (ps) Skull Base To Thigh  Result Date: 10/29/2018 CLINICAL DATA:  Subsequent treatment strategy for chronic lymphocytic leukemia. EXAM: NUCLEAR MEDICINE PET SKULL BASE TO THIGH TECHNIQUE: 9.9 mCi F-18 FDG was injected intravenously.  Full-ring PET imaging was performed from the skull base to thigh after the radiotracer. CT data was obtained and used for attenuation correction and anatomic localization. Fasting blood glucose: 87 mg/dl COMPARISON:  07/23/2018 FINDINGS: Mediastinal blood pool activity: SUV max 2.4 NECK: Interval progression of hypermetabolic cervical lymphadenopathy. Index left level II node measured previously at 12 mm with SUV max = 3.5 measures similar a 12 mm today but demonstrates SUV max = 5. Right-sided level III lymph node measured previously at 10 mm short axis is now 12 mm. SUV max = 7.4 today compared to 2.4 previously. Similar stable to a minimal increase in size with diffusely increased hypermetabolism noted in  the numerous bilateral cervical lymph nodes. Incidental CT findings: none CHEST: Interval progression of hypermetabolism in the bilateral axillary lymphadenopathy. The index low left axillary node measured previously at 1.3 cm short axis with SUV max = 3 now measures 1.5 cm short axis with SUV max = 4.6. Other left axillary lymph nodes demonstrate SUV max up to 7.4. Index lymph node in the right axilla measured previously at 1.1 cm now measures 1.2 cm. SUV max = 8.1 today compared to 2.8 previously. Hypermetabolic lymph nodes are seen in the mediastinum in each hilum. 11 mm short axis subcarinal lymph node has SUV max = 4.7. Incidental CT findings: Coronary artery calcification is evident. Atherosclerotic calcification is noted in the wall of the thoracic aorta. ABDOMEN/PELVIS: No abnormal radiotracer uptake identified within the liver, pancreas, or adrenal glands. SUV max = for the spleen days 3.5 compared to 3.2 previously. Bulky central abdominal mesenteric lymphadenopathy is again noted. Nodal conglomeration measured previously at 16.5 x 9.3 cm now measures 18.2 x 11.6 cm. SUV max = 8.7 today in this nodal conglomeration compared to 3.9 previously. Similar progression of pelvic sidewall lymphadenopathy  bilaterally. Right pelvic sidewall lymph node measured previously at 2.1 cm is now 3.0 cm. SUV max = 9.4 today compared to 3.4 previously. Incidental CT findings: There is abdominal aortic atherosclerosis without aneurysm. Layering sludge or tiny stones noted in the gallbladder. SKELETON: Mottled marrow uptake identified without definite focal hypermetabolism to suggest metastatic involvement. Incidental CT findings: none IMPRESSION: The extensive lymphadenopathy seen previously in the neck, chest, abdomen, and pelvis shows slight interval increase in size by CT imaging in the neck and chest but more prominent enlargement in the abdomen. This lymphadenopathy shows prominent increase in hypermetabolism with uptake levels now compatible with Deauville 5 criteria. Aortic Atherosclerois (ICD10-170.0) Electronically Signed   By: Misty Stanley M.D.   On: 10/29/2018 15:55    Labs:  CBC: Recent Labs    10/02/18 0836 10/22/18 0738 10/30/18 1408 11/03/18 1517  WBC 27.2* 36.0* 24.2* 35.7*  HGB 13.1 12.6 13.6 13.4  HCT 40.8 40.4 42.8 41.5  PLT 124* 172 145* 137*    COAGS: No results for input(s): INR, APTT in the last 8760 hours.  BMP: Recent Labs    10/02/18 0836 10/22/18 0738 10/30/18 1408 11/03/18 1517  NA 139 141 140 135  K 4.0 3.7 3.7 3.5  CL 107 106 105 100  CO2 23 25 26 24   GLUCOSE 132* 152* 93 93  BUN 14 14 7* 11  CALCIUM 8.5* 8.2* 9.2 9.0  CREATININE 0.87 1.01* 0.95 1.06*  GFRNONAA >60 58* >60 54*  GFRAA >60 >60 >60 >60    LIVER FUNCTION TESTS: Recent Labs    10/02/18 0836 10/22/18 0738 10/30/18 1408 11/03/18 1517  BILITOT 0.7 0.7 1.2 1.0  AST 14* 15 30 23   ALT 13 6 17 8   ALKPHOS 94 87 116 120  PROT 5.8* 5.9* 5.6* 5.8*  ALBUMIN 3.5 3.6 3.6 3.8    TUMOR MARKERS: No results for input(s): AFPTM, CEA, CA199, CHROMGRNA in the last 8760 hours.  Assessment and Plan: 68 y.o. female with history of small lymphocytic lymphoma/CLL initially diagnosed in 2003, s/p  chemotherapy. She now presents with disease progression and is scheduled for port a cath placement for additional treatment. Risks and benefits of image guided port-a-catheter placement was discussed with the patient including, but not limited to bleeding, infection, pneumothorax, or fibrin sheath development and need for additional procedures.  All of the patient's  questions were answered, patient is agreeable to proceed. Consent signed and in chart.  LABS PENDING    Thank you for this interesting consult.  I greatly enjoyed meeting Maria [name] and look forward to participating in their care.  A copy of this report was sent to the requesting provider on this date.  Electronically Signed: D. Rowe Robert, PA-C 11/10/2018, 12:54 PM   I spent a total of  25 minutes   in face to face in clinical consultation, greater than 50% of which was counseling/coordinating care for port a cath placement

## 2018-11-10 NOTE — Discharge Instructions (Addendum)
Do not use EMLA cream on the skin glue (dermabond) as it will dissolve the skin glue. Wait until the cancer center nurses tell you it is okay to use it.     Implanted [facility] Guide An implanted port is a device that is placed under the skin. It is usually placed in the chest. The device can be used to give IV medicine, to take blood, or for dialysis. You may have an implanted port if:  You need IV medicine that would be irritating to the small veins in your hands or arms.  You need IV medicines, such as antibiotics, for a long period of time.  You need IV nutrition for a long period of time.  You need dialysis. Having a port means that your health care provider will not need to use the veins in your arms for these procedures. You may have fewer limitations when using a port than you would if you used other types of long-term IVs, and you will likely be able to return to normal activities after your incision heals. An implanted port has two main parts:  Reservoir. The reservoir is the part where a needle is inserted to give medicines or draw blood. The reservoir is round. After it is placed, it appears as a small, raised area under your skin.  Catheter. The catheter is a thin, flexible tube that connects the reservoir to a vein. Medicine that is inserted into the reservoir goes into the catheter and then into the vein. How is my port accessed? To access your port:  A numbing cream may be placed on the skin over the port site.  Your health care provider will put on a mask and sterile gloves.  The skin over your port will be cleaned carefully with a germ-killing soap and allowed to dry.  Your health care provider will gently pinch the port and insert a needle into it.  Your health care provider will check for a blood return to make sure the port is in the vein and is not clogged.  If your port needs to remain accessed to get medicine continuously (constant infusion), your health care  provider will place a clear bandage (dressing) over the needle site. The dressing and needle will need to be changed every week, or as told by your health care provider. What is flushing? Flushing helps keep the port from getting clogged. Follow instructions from your health care provider about how and when to flush the port. Ports are usually flushed with saline solution or a medicine called heparin. The need for flushing will depend on how the port is used:  If the port is only used from time to time to give medicines or draw blood, the port may need to be flushed: ? Before and after medicines have been given. ? Before and after blood has been drawn. ? As part of routine maintenance. Flushing may be recommended every 4-6 weeks.  If a constant infusion is running, the port may not need to be flushed.  Throw away any syringes in a disposal container that is meant for sharp items (sharps container). You can buy a sharps container from a pharmacy, or you can make one by using an empty hard plastic bottle with a cover. How long will my port stay implanted? The port can stay in for as long as your health care provider thinks it is needed. When it is time for the port to come out, a surgery will be done to  remove it. The surgery will be similar to the procedure that was done to put the port in. Follow these instructions at home:   Flush your port as told by your health care provider.  If you need an infusion over several days, follow instructions from your health care provider about how to take care of your port site. Make sure you: ? Wash your hands with soap and water before you change your dressing. If soap and water are not available, use alcohol-based hand sanitizer. ? Change your dressing as told by your health care provider. ? Place any used dressings or infusion bags into a plastic bag. Throw that bag in the trash. ? Keep the dressing that covers the needle clean and dry. Do not get it  wet. ? Do not use scissors or sharp objects near the tube. ? Keep the tube clamped, unless it is being used.  Check your port site every day for signs of infection. Check for: ? Redness, swelling, or pain. ? Fluid or blood. ? Pus or a bad smell.  Protect the skin around the port site. ? Avoid wearing bra straps that rub or irritate the site. ? Protect the skin around your port from seat belts. Place a soft pad over your chest if needed.  Bathe or shower as told by your health care provider. The site may get wet as long as you are not actively receiving an infusion.  Return to your normal activities as told by your health care provider. Ask your health care provider what activities are safe for you.  Carry a medical alert card or wear a medical alert bracelet at all times. This will let health care providers know that you have an implanted port in case of an emergency. Get help right away if:  You have redness, swelling, or pain at the port site.  You have fluid or blood coming from your port site.  You have pus or a bad smell coming from the port site.  You have a fever. Summary  Implanted ports are usually placed in the chest for long-term IV access.  Follow instructions from your health care provider about flushing the port and changing bandages (dressings).  Take care of the area around your port by avoiding clothing that puts pressure on the area, and by watching for signs of infection.  Protect the skin around your port from seat belts. Place a soft pad over your chest if needed.  Get help right away if you have a fever or you have redness, swelling, pain, drainage, or a bad smell at the port site. This information is not intended to replace advice given to you by your health care provider. Make sure you discuss any questions you have with your health care provider. Document Released: 08/27/2005 Document Revised: 09/29/2016 Document Reviewed: 09/29/2016 Elsevier  Interactive Patient Education  2019 [employer].    Moderate Conscious Sedation, Adult, Care After These instructions provide you with information about caring for yourself after your procedure. Your health care provider may also give you more specific instructions. Your treatment has been planned according to current medical practices, but problems sometimes occur. Call your health care provider if you have any problems or questions after your procedure. What can I expect after the procedure? After your procedure, it is common:  To feel sleepy for several hours.  To feel clumsy and have poor balance for several hours.  To have poor judgment for several hours.  To vomit if you  eat too soon. Follow these instructions at home: For at least 24 hours after the procedure:   Do not: ? Participate in activities where you could fall or become injured. ? Drive. ? Use heavy machinery. ? Drink alcohol. ? Take sleeping pills or medicines that cause drowsiness. ? Make important decisions or sign legal documents. ? Take care of children on your own.  Rest. Eating and drinking  Follow the diet recommended by your health care provider.  If you vomit: ? Drink water, juice, or soup when you can drink without vomiting. ? Make sure you have little or no nausea before eating solid foods. General instructions  Have a responsible adult stay with you until you are awake and alert.  Take over-the-counter and prescription medicines only as told by your health care provider.  If you smoke, do not smoke without supervision.  Keep all follow-up visits as told by your health care provider. This is important. Contact a health care provider if:  You keep feeling nauseous or you keep vomiting.  You feel light-headed.  You develop a rash.  You have a fever. Get help right away if:  You have trouble breathing. This information is not intended to replace advice given to you by your health  care provider. Make sure you discuss any questions you have with your health care provider. Document Released: 06/17/2013 Document Revised: 01/30/2016 Document Reviewed: 12/17/2015 Elsevier Interactive Patient Education  2019 [employer].

## 2018-11-11 ENCOUNTER — Inpatient Hospital Stay: Payer: Medicare Other | Attending: Oncology

## 2018-11-11 VITALS — BP 143/58 | HR 81 | Temp 98.1°F | Resp 18

## 2018-11-11 DIAGNOSIS — Z5111 Encounter for antineoplastic chemotherapy: Secondary | ICD-10-CM | POA: Diagnosis present

## 2018-11-11 DIAGNOSIS — D709 Neutropenia, unspecified: Secondary | ICD-10-CM | POA: Insufficient documentation

## 2018-11-11 DIAGNOSIS — C9110 Chronic lymphocytic leukemia of B-cell type not having achieved remission: Secondary | ICD-10-CM | POA: Insufficient documentation

## 2018-11-11 DIAGNOSIS — Z5189 Encounter for other specified aftercare: Secondary | ICD-10-CM | POA: Diagnosis not present

## 2018-11-11 DIAGNOSIS — Z5112 Encounter for antineoplastic immunotherapy: Secondary | ICD-10-CM | POA: Diagnosis present

## 2018-11-11 DIAGNOSIS — D696 Thrombocytopenia, unspecified: Secondary | ICD-10-CM | POA: Insufficient documentation

## 2018-11-11 DIAGNOSIS — G629 Polyneuropathy, unspecified: Secondary | ICD-10-CM | POA: Diagnosis not present

## 2018-11-11 DIAGNOSIS — D801 Nonfamilial hypogammaglobulinemia: Secondary | ICD-10-CM | POA: Insufficient documentation

## 2018-11-11 MED ORDER — DEXAMETHASONE SODIUM PHOSPHATE 10 MG/ML IJ SOLN
10.0000 mg | Freq: Once | INTRAMUSCULAR | Status: AC
  Administered 2018-11-11: 10 mg via INTRAVENOUS

## 2018-11-11 MED ORDER — DIPHENHYDRAMINE HCL 50 MG/ML IJ SOLN
INTRAMUSCULAR | Status: AC
  Filled 2018-11-11: qty 1

## 2018-11-11 MED ORDER — SODIUM CHLORIDE 0.9% FLUSH
10.0000 mL | INTRAVENOUS | Status: DC | PRN
  Administered 2018-11-11: 10 mL
  Filled 2018-11-11: qty 10

## 2018-11-11 MED ORDER — ACETAMINOPHEN 325 MG PO TABS
650.0000 mg | ORAL_TABLET | Freq: Once | ORAL | Status: AC
  Administered 2018-11-11: 650 mg via ORAL

## 2018-11-11 MED ORDER — HEPARIN SOD (PORK) LOCK FLUSH 100 UNIT/ML IV SOLN
500.0000 [IU] | Freq: Once | INTRAVENOUS | Status: AC | PRN
  Administered 2018-11-11: 500 [IU]
  Filled 2018-11-11: qty 5

## 2018-11-11 MED ORDER — SODIUM CHLORIDE 0.9 % IV SOLN
INTRAVENOUS | Status: DC
  Administered 2018-11-11: 08:00:00 via INTRAVENOUS
  Filled 2018-11-11: qty 250

## 2018-11-11 MED ORDER — DIPHENHYDRAMINE HCL 50 MG/ML IJ SOLN
25.0000 mg | Freq: Once | INTRAMUSCULAR | Status: AC
  Administered 2018-11-11: 25 mg via INTRAVENOUS

## 2018-11-11 MED ORDER — DEXAMETHASONE SODIUM PHOSPHATE 10 MG/ML IJ SOLN
INTRAMUSCULAR | Status: AC
  Filled 2018-11-11: qty 1

## 2018-11-11 MED ORDER — SODIUM CHLORIDE 0.9 % IV SOLN
100.0000 mg | Freq: Once | INTRAVENOUS | Status: AC
  Administered 2018-11-11: 100 mg via INTRAVENOUS
  Filled 2018-11-11: qty 4

## 2018-11-11 MED ORDER — ACETAMINOPHEN 325 MG PO TABS
ORAL_TABLET | ORAL | Status: AC
  Filled 2018-11-11: qty 2

## 2018-11-11 NOTE — Patient Instructions (Signed)
[place] Discharge Instructions for Patients Receiving Chemotherapy  Today you received the following chemotherapy agents Gazyva  To help prevent nausea and vomiting after your treatment, we encourage you to take your nausea medication as directed by MD   If you develop nausea and vomiting that is not controlled by your nausea medication, call the clinic.   BELOW ARE SYMPTOMS THAT SHOULD BE REPORTED IMMEDIATELY:  *FEVER GREATER THAN 100.5 F  *CHILLS WITH OR WITHOUT FEVER  NAUSEA AND VOMITING THAT IS NOT CONTROLLED WITH YOUR NAUSEA MEDICATION  *UNUSUAL SHORTNESS OF BREATH  *UNUSUAL BRUISING OR BLEEDING  TENDERNESS IN MOUTH AND THROAT WITH OR WITHOUT PRESENCE OF ULCERS  *URINARY PROBLEMS  *BOWEL PROBLEMS  UNUSUAL RASH Items with * indicate a potential emergency and should be followed up as soon as possible.  Feel free to call the clinic should you have any questions or concerns. The clinic phone number is [phone].  Please show the [name] at check-in to the Emergency Department and triage nurse.  Gazyva- Obinutuzumab injection What is this medicine? OBINUTUZUMAB (OH bi nue TOOZ ue mab) is a monoclonal antibody. It is used to treat chronic lymphocytic leukemia (CLL) and a type of non-Hodgkin lymphoma (NHL), follicular lymphoma. This medicine may be used for other purposes; ask your health care provider or pharmacist if you have questions. COMMON BRAND NAME(S): GAZYVA What should I tell my health care provider before I take this medicine? They need to know if you have any of these conditions: -infection (especially a virus infection such as hepatitis B virus) -lung or breathing disease -heart disease -take medicines that treat or prevent blood clots -an unusual or allergic reaction to obinutuzumab, other medicines, foods, dyes, or preservatives -pregnant or trying to get pregnant -breast-feeding How should I use this medicine? This  medicine is for infusion into a vein. It is given by a health care professional in a hospital or clinic setting. Talk to your pediatrician regarding the use of this medicine in children. Special care may be needed. Overdosage: If you think you have taken too much of this medicine contact a poison control center or emergency room at once. NOTE: This medicine is only for you. Do not share this medicine with others. What if I miss a dose? Keep appointments for follow-up doses as directed. It is important not to miss your dose. Call your doctor or health care professional if you are unable to keep an appointment. What may interact with this medicine? -live virus vaccines This list may not describe all possible interactions. Give your health care provider a list of all the medicines, herbs, non-prescription drugs, or dietary supplements you use. Also tell them if you smoke, drink alcohol, or use illegal drugs. Some items may interact with your medicine. What should I watch for while using this medicine? Report any side effects that you notice during your treatment right away, such as changes in your breathing, fever, chills, dizziness or lightheadedness. These effects are more common with the first dose. Visit your prescriber or health care professional for checks on your progress. You will need to have regular blood work. Report any other side effects. The side effects of this medicine can continue after you finish your treatment. Continue your course of treatment even though you feel ill unless your doctor tells you to stop. Call your doctor or health care professional for advice if you get a fever, chills or sore throat, or other symptoms of a cold or  flu. Do not treat yourself. This drug decreases your body's ability to fight infections. Try to avoid being around people who are sick. This medicine may increase your risk to bruise or bleed. Call your doctor or health care professional if you notice any  unusual bleeding. What side effects may I notice from receiving this medicine? Side effects that you should report to your doctor or health care professional as soon as possible: -allergic reactions like skin rash, itching or hives, swelling of the face, lips, or tongue -breathing problems -changes in vision -chest pain or chest tightness -confusion -dizziness -loss of balance or coordination -low blood counts - this medicine may decrease the number of white blood cells, red blood cells and platelets. You may be at increased risk for infections and bleeding. -signs of decreased platelets or bleeding - bruising, pinpoint red spots on the skin, black, tarry stools, blood in the urine -signs of infection - fever or chills, cough, sore throat, pain or trouble passing urine -signs and symptoms of liver injury like dark yellow or brown urine; general ill feeling or flu-like symptoms; light-colored stools; loss of appetite; nausea; right upper belly pain; unusually weak or tired; yellowing of the eyes or skin -trouble speaking or understanding -trouble walking -vomiting Side effects that usually do not require medical attention (report to your doctor or health care professional if they continue or are bothersome): -constipation -joint pain -muscle pain This list may not describe all possible side effects. Call your doctor for medical advice about side effects. You may report side effects to FDA at 1-800-FDA-1088. Where should I keep my medicine? This drug is only given in a hospital or clinic and will not be stored at home. NOTE: This sheet is a summary. It may not cover all possible information. If you have questions about this medicine, talk to your doctor, pharmacist, or health care provider.  2019 [employer] (2015-09-29 08:54:03)

## 2018-11-12 ENCOUNTER — Inpatient Hospital Stay: Payer: Medicare Other

## 2018-11-12 ENCOUNTER — Ambulatory Visit: Payer: Medicare Other

## 2018-11-12 VITALS — BP 163/95 | HR 63 | Temp 98.3°F | Resp 16

## 2018-11-12 DIAGNOSIS — Z5111 Encounter for antineoplastic chemotherapy: Secondary | ICD-10-CM | POA: Diagnosis not present

## 2018-11-12 DIAGNOSIS — C9110 Chronic lymphocytic leukemia of B-cell type not having achieved remission: Secondary | ICD-10-CM

## 2018-11-12 MED ORDER — ACETAMINOPHEN 325 MG PO TABS
650.0000 mg | ORAL_TABLET | Freq: Once | ORAL | Status: AC
  Administered 2018-11-12: 650 mg via ORAL

## 2018-11-12 MED ORDER — ACETAMINOPHEN 325 MG PO TABS
ORAL_TABLET | ORAL | Status: AC
  Filled 2018-11-12: qty 2

## 2018-11-12 MED ORDER — DIPHENHYDRAMINE HCL 50 MG/ML IJ SOLN
INTRAMUSCULAR | Status: AC
  Filled 2018-11-12: qty 1

## 2018-11-12 MED ORDER — SODIUM CHLORIDE 0.9 % IV SOLN
900.0000 mg | Freq: Once | INTRAVENOUS | Status: AC
  Administered 2018-11-12: 900 mg via INTRAVENOUS
  Filled 2018-11-12: qty 36

## 2018-11-12 MED ORDER — SODIUM CHLORIDE 0.9% FLUSH
10.0000 mL | INTRAVENOUS | Status: DC | PRN
  Administered 2018-11-12: 10 mL
  Filled 2018-11-12: qty 10

## 2018-11-12 MED ORDER — DEXAMETHASONE SODIUM PHOSPHATE 10 MG/ML IJ SOLN
10.0000 mg | Freq: Once | INTRAMUSCULAR | Status: AC
  Administered 2018-11-12: 10 mg via INTRAVENOUS

## 2018-11-12 MED ORDER — DEXAMETHASONE SODIUM PHOSPHATE 10 MG/ML IJ SOLN
INTRAMUSCULAR | Status: AC
  Filled 2018-11-12: qty 1

## 2018-11-12 MED ORDER — SODIUM CHLORIDE 0.9 % IV SOLN
INTRAVENOUS | Status: DC
  Administered 2018-11-12: 09:00:00 via INTRAVENOUS
  Filled 2018-11-12: qty 250

## 2018-11-12 MED ORDER — DIPHENHYDRAMINE HCL 50 MG/ML IJ SOLN
25.0000 mg | Freq: Once | INTRAMUSCULAR | Status: AC
  Administered 2018-11-12: 25 mg via INTRAVENOUS

## 2018-11-12 MED ORDER — HEPARIN SOD (PORK) LOCK FLUSH 100 UNIT/ML IV SOLN
500.0000 [IU] | Freq: Once | INTRAVENOUS | Status: AC | PRN
  Administered 2018-11-12: 500 [IU]
  Filled 2018-11-12: qty 5

## 2018-11-12 NOTE — Patient Instructions (Signed)
[facility] Discharge Instructions for Patients Receiving Chemotherapy  Today you received the following chemotherapy agents Gazyva  To help prevent nausea and vomiting after your treatment, we encourage you to take your nausea medication as directed by MD   If you develop nausea and vomiting that is not controlled by your nausea medication, call the clinic.   BELOW ARE SYMPTOMS THAT SHOULD BE REPORTED IMMEDIATELY:  *FEVER GREATER THAN 100.5 F  *CHILLS WITH OR WITHOUT FEVER  NAUSEA AND VOMITING THAT IS NOT CONTROLLED WITH YOUR NAUSEA MEDICATION  *UNUSUAL SHORTNESS OF BREATH  *UNUSUAL BRUISING OR BLEEDING  TENDERNESS IN MOUTH AND THROAT WITH OR WITHOUT PRESENCE OF ULCERS  *URINARY PROBLEMS  *BOWEL PROBLEMS  UNUSUAL RASH Items with * indicate a potential emergency and should be followed up as soon as possible.  Feel free to call the clinic should you have any questions or concerns. The clinic phone number is [phone].  Please show the [place] at check-in to the Emergency Department and triage nurse.

## 2018-11-13 ENCOUNTER — Other Ambulatory Visit: Payer: Self-pay | Admitting: Oncology

## 2018-11-13 MED ORDER — PREDNISONE 20 MG PO TABS: 60.0000 mg | ORAL_TABLET | Freq: Every day | ORAL | 6 refills | Status: DC

## 2018-11-13 NOTE — Progress Notes (Signed)
Maria [name] tells me the lymph nodes on her neck are softer, she is diuresing better, her abdomen is less distended, and she has lost 10 pounds which she really likes.  She feels the treatment is working.  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  She knows to call for any other issues that may develop before then.

## 2018-11-19 ENCOUNTER — Inpatient Hospital Stay: Payer: Medicare Other | Admitting: Adult Health

## 2018-11-19 ENCOUNTER — Inpatient Hospital Stay: Payer: Medicare Other

## 2018-11-19 ENCOUNTER — Encounter: Payer: Self-pay | Admitting: Adult Health

## 2018-11-19 ENCOUNTER — Other Ambulatory Visit: Payer: Self-pay

## 2018-11-19 VITALS — BP 128/61 | HR 66 | Temp 98.3°F | Resp 17

## 2018-11-19 VITALS — BP 144/57 | HR 82 | Temp 98.6°F | Resp 18 | Ht 62.25 in | Wt 187.4 lb

## 2018-11-19 DIAGNOSIS — C9110 Chronic lymphocytic leukemia of B-cell type not having achieved remission: Secondary | ICD-10-CM | POA: Diagnosis not present

## 2018-11-19 DIAGNOSIS — Z5111 Encounter for antineoplastic chemotherapy: Secondary | ICD-10-CM | POA: Diagnosis not present

## 2018-11-19 DIAGNOSIS — D696 Thrombocytopenia, unspecified: Secondary | ICD-10-CM

## 2018-11-19 LAB — CBC WITH DIFFERENTIAL/PLATELET
Abs Immature Granulocytes: 0.01 10*3/uL (ref 0.00–0.07)
BASOS ABS: 0 10*3/uL (ref 0.0–0.1)
Basophils Relative: 0 %
Eosinophils Absolute: 0.1 10*3/uL (ref 0.0–0.5)
Eosinophils Relative: 3 %
HCT: 38.6 % (ref 36.0–46.0)
Hemoglobin: 12.5 g/dL (ref 12.0–15.0)
Immature Granulocytes: 0 %
Lymphocytes Relative: 25 %
Lymphs Abs: 0.6 10*3/uL — ABNORMAL LOW (ref 0.7–4.0)
MCH: 27.7 pg (ref 26.0–34.0)
MCHC: 32.4 g/dL (ref 30.0–36.0)
MCV: 85.4 fL (ref 80.0–100.0)
MONOS PCT: 9 %
Monocytes Absolute: 0.2 10*3/uL (ref 0.1–1.0)
Neutro Abs: 1.5 10*3/uL — ABNORMAL LOW (ref 1.7–7.7)
Neutrophils Relative %: 63 %
PLATELETS: 52 10*3/uL — AB (ref 150–400)
RBC: 4.52 MIL/uL (ref 3.87–5.11)
RDW: 15.6 % — ABNORMAL HIGH (ref 11.5–15.5)
WBC: 2.4 10*3/uL — ABNORMAL LOW (ref 4.0–10.5)
nRBC: 0 % (ref 0.0–0.2)

## 2018-11-19 LAB — COMPREHENSIVE METABOLIC PANEL
ALT: 7 U/L (ref 0–44)
ANION GAP: 10 (ref 5–15)
AST: 13 U/L — ABNORMAL LOW (ref 15–41)
Albumin: 3.1 g/dL — ABNORMAL LOW (ref 3.5–5.0)
Alkaline Phosphatase: 117 U/L (ref 38–126)
BUN: 17 mg/dL (ref 8–23)
CO2: 23 mmol/L (ref 22–32)
Calcium: 8.7 mg/dL — ABNORMAL LOW (ref 8.9–10.3)
Chloride: 102 mmol/L (ref 98–111)
Creatinine, Ser: 0.98 mg/dL (ref 0.44–1.00)
GFR calc non Af Amer: 60 mL/min — ABNORMAL LOW (ref >60)
GLUCOSE: 280 mg/dL — AB (ref 70–99)
Potassium: 3.7 mmol/L (ref 3.5–5.1)
SODIUM: 135 mmol/L (ref 135–145)
Total Bilirubin: 0.9 mg/dL (ref 0.3–1.2)
Total Protein: 5.3 g/dL — ABNORMAL LOW (ref 6.5–8.1)

## 2018-11-19 LAB — URIC ACID: Uric Acid, Serum: 4.1 mg/dL (ref 2.5–7.1)

## 2018-11-19 LAB — PHOSPHORUS: Phosphorus: 3.1 mg/dL (ref 2.5–4.6)

## 2018-11-19 LAB — LACTATE DEHYDROGENASE: LDH: 451 U/L — ABNORMAL HIGH (ref 98–192)

## 2018-11-19 MED ORDER — DOXORUBICIN HCL CHEMO IV INJECTION 2 MG/ML
50.0000 mg/m2 | Freq: Once | INTRAVENOUS | Status: AC
  Administered 2018-11-19: 100 mg via INTRAVENOUS
  Filled 2018-11-19: qty 50

## 2018-11-19 MED ORDER — PALONOSETRON HCL INJECTION 0.25 MG/5ML
INTRAVENOUS | Status: AC
  Filled 2018-11-19: qty 5

## 2018-11-19 MED ORDER — SODIUM CHLORIDE 0.9 % IV SOLN
750.0000 mg/m2 | Freq: Once | INTRAVENOUS | Status: AC
  Administered 2018-11-19: 1500 mg via INTRAVENOUS
  Filled 2018-11-19: qty 75

## 2018-11-19 MED ORDER — DEXAMETHASONE SODIUM PHOSPHATE 10 MG/ML IJ SOLN: 10.0000 mg | Freq: Once | INTRAMUSCULAR | Status: DC

## 2018-11-19 MED ORDER — ACETAMINOPHEN 325 MG PO TABS
650.0000 mg | ORAL_TABLET | Freq: Once | ORAL | Status: AC
  Administered 2018-11-19: 650 mg via ORAL

## 2018-11-19 MED ORDER — SODIUM CHLORIDE 0.9 % IV SOLN
1000.0000 mg | Freq: Once | INTRAVENOUS | Status: AC
  Administered 2018-11-19: 1000 mg via INTRAVENOUS
  Filled 2018-11-19: qty 40

## 2018-11-19 MED ORDER — SODIUM CHLORIDE 0.9 % IV SOLN
Freq: Once | INTRAVENOUS | Status: AC
  Administered 2018-11-19: 11:00:00 via INTRAVENOUS
  Filled 2018-11-19: qty 250

## 2018-11-19 MED ORDER — SODIUM CHLORIDE 0.9 % IV SOLN
Freq: Once | INTRAVENOUS | Status: AC
  Administered 2018-11-19: 12:00:00 via INTRAVENOUS
  Filled 2018-11-19: qty 5

## 2018-11-19 MED ORDER — DIPHENHYDRAMINE HCL 50 MG/ML IJ SOLN
INTRAMUSCULAR | Status: AC
  Filled 2018-11-19: qty 1

## 2018-11-19 MED ORDER — PALONOSETRON HCL INJECTION 0.25 MG/5ML
0.2500 mg | Freq: Once | INTRAVENOUS | Status: AC
  Administered 2018-11-19: 0.25 mg via INTRAVENOUS

## 2018-11-19 MED ORDER — ACETAMINOPHEN 325 MG PO TABS
ORAL_TABLET | ORAL | Status: AC
  Filled 2018-11-19: qty 2

## 2018-11-19 MED ORDER — PREDNISONE 20 MG PO TABS: 60.0000 mg | ORAL_TABLET | Freq: Every day | ORAL | 6 refills | Status: DC

## 2018-11-19 MED ORDER — HEPARIN SOD (PORK) LOCK FLUSH 100 UNIT/ML IV SOLN
500.0000 [IU] | Freq: Once | INTRAVENOUS | Status: AC | PRN
  Administered 2018-11-19: 500 [IU]
  Filled 2018-11-19: qty 5

## 2018-11-19 MED ORDER — SODIUM CHLORIDE 0.9% FLUSH
10.0000 mL | INTRAVENOUS | Status: DC | PRN
  Administered 2018-11-19: 10 mL
  Filled 2018-11-19: qty 10

## 2018-11-19 MED ORDER — DIPHENHYDRAMINE HCL 50 MG/ML IJ SOLN
25.0000 mg | Freq: Once | INTRAMUSCULAR | Status: AC
  Administered 2018-11-19: 25 mg via INTRAVENOUS

## 2018-11-19 NOTE — Progress Notes (Signed)
Per Dr. Jana Hakim, patient is ok to treat today with CHOP chemotherapy and Obinutuzumab with platelet count of 52 and ANC of 1.5.    Wilber Bihari, NP

## 2018-11-19 NOTE — Progress Notes (Signed)
[facility]  [phone] [phone]    ID: Maria [name]   DOB: 04/07/1951  MR#: [id number]  VZD#:[id number]  Patient Care Team: Marton Redwood, MD as PCP - General (Internal Medicine) [name], MD as Consulting Physician (Orthopedic Surgery) [name], [name] Dad, MD as Consulting Physician (Oncology)   CHIEF COMPLAINT: Small lymphocytic lymphoma  CURRENT TREATMENT: CHOP chemotherapy/obinutuzumab   HISTORY OF PRESENT ILLNESS: From the original intake note:  Maria [name] developed what she thought was "the flu" in December of 2002. She noted a large lymph node developing in her right anterior cervical area. 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   Her subsequent history is as detailed below    INTERVAL HISTORY: Maria [name] returns today for follow-up and treatment of her chronic lymphoid leukemia.   Due to her disease progresion, Rituximab/bendamustine was discontinued.  She was then started on treatment with Obinutuzumab given on 3/3 and 3/4.    At her appointment on 2/24 when she was in significant abdominal pain, she took lasix, lorazepam, dexamethasone, and ondansetron.  She has been able to taper off the Dexamethasone.  She did not have to take any percocet.  Her pain is much improved. Her weight has improved with diuresis by 10 pounds.    Since her last visit she underwent echocardiogram on 11/07/2018 that demonstrated a normal systolic function with an ejection fraction of 60-65%.    She is due to start CHOP chemotherapy today without Vincristine; she will receive Neulasta on day 3.  She will take Prednisone 60mg  orally days 1-5.     REVIEW OF SYSTEMS: Maria [name] is doing well today.  She notes her pain is improved.  She denies any issues and notes her lymph nodes have started to decrease in size.  She has not yet picked up her prednisone.  She denies any fever,  chills, cough, sore throat, mucositis.  She is without chest pain, palpitations, shortness of breath.  She underwent port placement and though it is sore, she is adjusting to having the port now.  She denies any easy bruising/bleeding.  A detailed ROS was otherwise non contributory today.     PAST MEDICAL HISTORY: Past Medical History:  Diagnosis Date  . Allergy   . Arthritis   . Asthma   . Cancer [facility])    chronic lymphacytic leukemia  . CLL (chronic lymphocytic leukemia) ([place])   . Diabetes mellitus    PMH  . Headache    migraine  . Hyperlipidemia   . Hypertension   . Hypothyroidism   . Lymphoma ([place])   . Thyroid disease    hypothyroidism  . Wears glasses     PAST SURGICAL HISTORY: Past Surgical History:  Procedure Laterality Date  . ABDOMINAL HYSTERECTOMY    . AXILLARY LYMPH NODE BIOPSY Right 08/21/2018   Procedure: RIGHT AXILLARY LYMPH NODE BIOPSY ERAS PATHWAY;  Surgeon: [name], MD;  Location: [facility];  Service: General;  Laterality: Right;  LMA  . BREAST EXCISIONAL BIOPSY Right   . COLONOSCOPY    . IR IMAGING GUIDED PORT INSERTION  11/10/2018  . LYMPH NODE DISSECTION    . STRABISMUS SURGERY    . TONSILLECTOMY    . TOTAL KNEE ARTHROPLASTY Right 12/03/2016   Procedure: RIGHT TOTAL KNEE ARTHROPLASTY;  Surgeon: [name], MD;  Location: [facility];  Service: Orthopedics;  Laterality: Right;    FAMILY HISTORY: Family History  Problem Relation Age  of Onset  . Liver cancer Mother   . Heart disease Father   . Breast cancer Sister 36  . Colon cancer Neg Hx   . Pancreatic cancer Neg Hx   . Rectal cancer Neg Hx   . Stomach cancer Neg Hx    The patient's father died at the age of 65 from a myocardial infarction. The patient's mother died at the age of 75 from primary liver carcinoma. The patient had no brothers. Her one sister, Maria [name], has a history of breast cancer   GYNECOLOGIC HISTORY: Menarche at around age 56. The patient is GX P0. 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 She lives by herself with her cats Maria [name]. She attends a Investment banker, operational.   ADVANCED DIRECTIVES: Not in place   HEALTH MAINTENANCE: Social History   Tobacco Use  . Smoking status: Never Smoker  . Smokeless tobacco: Never Used  Substance Use Topics  . Alcohol use: Yes    Comment: occasional alcohol intake; once or twice monthly  . Drug use: No     Colonoscopy: 06/01/2014/[facility]  PAP:  Bone density:  Lipid panel:  228/114/50/155/ratio 4.6  On 10/01/2016  Allergies  Allergen Reactions  . Iohexol Swelling and Rash  . Compazine [Prochlorperazine Edisylate] Other (See Comments)    Mild confusion  . Contrast Media  [Iodinated Diagnostic Agents]   . Lactose Intolerance (Gi)     Diarrhea, gas bloating  . Lactulose Diarrhea and Other (See Comments)    gas bloating  . Cephalexin Rash    Current Outpatient Medications  Medication Sig Dispense Refill  . acyclovir (ZOVIRAX) 200 MG capsule Take 200 mg by mouth 5 (five) times daily.    Marland Kitchen allopurinol (ZYLOPRIM) 300 MG tablet Take 1 tablet (300 mg total) by mouth daily. 90 tablet 4  . dexamethasone (DECADRON) 4 MG tablet Take 10 tablets (40 mg total) by mouth daily. 40 tablet 0  . hydrochlorothiazide (HYDRODIURIL) 12.5 MG tablet Take 12.5 mg by mouth daily.     Marland Kitchen lidocaine-prilocaine (EMLA) cream Apply to affected area once 30 g 3  . LORazepam (ATIVAN) 0.5 MG tablet Take 1 tablet (0.5 mg total) by mouth at bedtime as needed (Nausea or vomiting). 30 tablet 0  . ondansetron (ZOFRAN) 8 MG tablet Take 1 tablet (8 mg total) by mouth every 8 (eight) hours as needed for nausea or vomiting. 20 tablet 3  . oxyCODONE-acetaminophen (PERCOCET/ROXICET) 5-325 MG tablet Take 1-2 tablets by mouth every 6 (six) hours as needed for severe pain.  30 tablet 0  . predniSONE (DELTASONE) 20 MG tablet Take 3 tablets (60 mg total) by mouth daily with breakfast. Take days 1-5 starting on CHOP chemo day 15 tablet 6  . rosuvastatin (CRESTOR) 10 MG tablet Take 10 mg by mouth at bedtime.  11  . SYNTHROID 88 MCG tablet Take 88 mcg by mouth daily before breakfast.     . zoledronic acid (RECLAST) 5 MG/100ML SOLN injection Inject 5 mg into the vein once. Once a year     No current facility-administered medications for this visit.    Facility-Administered Medications Ordered in Other Visits  Medication Dose Route Frequency Provider Last Rate Last Dose  . heparin lock flush 100 unit/mL  500 Units Intracatheter Once PRN [name], [name] Dad, MD      .  sodium chloride flush (NS) 0.9 % injection 10 mL  10 mL Intracatheter PRN [name], [name] Dad, MD        OBJECTIVE: Middle-aged white woman   Vitals:   11/19/18 0933  BP: (!) 144/57  [name]: 82  Resp: 18  Temp: 98.6 F (37 C)  SpO2: 98%     Body mass index is 34 kg/m.    ECOG FS: 1  GENERAL: Patient is a well appearing female in no acute distress HEENT:  Sclerae anicteric.  Oropharynx clear and moist. No ulcerations or evidence of oropharyngeal candidiasis. Neck is supple.  NODES: + bilateral cervical lymphadenopathy, slightly smaller and softer than prior, + right axillary adenopathy BREAST EXAM:  Deferred. LUNGS:  Clear to auscultation bilaterally.  No wheezes or rhonchi. HEART:  Regular rate and rhythm. No murmur appreciated. ABDOMEN:  Soft, nontender.  Positive, normoactive bowel sounds. No organomegaly palpated. MSK:  No focal spinal tenderness to palpation. Full range of motion bilaterally in the upper extremities. EXTREMITIES:  No peripheral edema.   SKIN:  Clear with no obvious rashes or skin changes. No nail dyscrasia. NEURO:  Nonfocal. Well oriented.  Appropriate affect.         LAB RESULTS:  Lab Results  Component Value Date   WBC 2.4 (L) 11/19/2018   NEUTROABS 1.5 (L)  11/19/2018   HGB 12.5 11/19/2018   HCT 38.6 11/19/2018   MCV 85.4 11/19/2018   PLT 52 (L) 11/19/2018      Chemistry      Component Value Date/Time   NA 135 11/19/2018 0920   NA 139 08/05/2017 1353   K 3.7 11/19/2018 0920   K 3.7 08/05/2017 1353   CL 102 11/19/2018 0920   CO2 23 11/19/2018 0920   CO2 27 08/05/2017 1353   BUN 17 11/19/2018 0920   BUN 17.3 08/05/2017 1353   CREATININE 0.98 11/19/2018 0920   CREATININE 1.0 08/05/2017 1353   GLU 101 (H) 10/18/2009 1542      Component Value Date/Time   CALCIUM 8.7 (L) 11/19/2018 0920   CALCIUM 9.2 08/05/2017 1353   ALKPHOS 117 11/19/2018 0920   ALKPHOS 76 08/05/2017 1353   AST 13 (L) 11/19/2018 0920   AST 18 08/05/2017 1353   ALT 7 11/19/2018 0920   ALT 14 08/05/2017 1353   BILITOT 0.9 11/19/2018 0920   BILITOT 0.59 08/05/2017 1353       No results found for: LABCA2  No components found for: LABCA125  No results for input(s): INR in the last 168 hours.  Urinalysis    Component Value Date/Time   COLORURINE LT. YELLOW 01/24/2010 1017   APPEARANCEUR CLEAR 01/24/2010 1017   LABSPEC <=1.005 01/24/2010 1017   PHURINE 5.5 01/24/2010 1017   GLUCOSEU NEGATIVE 01/24/2010 [place] 01/24/2010 1017   KETONESUR NEGATIVE 01/24/2010 1017   UROBILINOGEN 0.2 01/24/2010 1017   NITRITE NEGATIVE 01/24/2010 1017   LEUKOCYTESUR NEGATIVE 01/24/2010 1017    STUDIES: Nm Pet Image Restag (ps) Skull Base To Thigh  Result Date: 10/29/2018 CLINICAL DATA:  Subsequent treatment strategy for chronic lymphocytic leukemia. EXAM: NUCLEAR MEDICINE PET SKULL BASE TO THIGH TECHNIQUE: 9.9 mCi F-18 FDG was injected intravenously. Full-ring PET imaging was performed from the skull base to thigh after the radiotracer. CT data was obtained and used for attenuation correction and anatomic localization. Fasting blood glucose: 87 mg/dl COMPARISON:  07/23/2018 FINDINGS: Mediastinal blood pool activity: SUV max 2.4 NECK: Interval  progression of hypermetabolic cervical lymphadenopathy. Index left level  II node measured previously at 12 mm with SUV max = 3.5 measures similar a 12 mm today but demonstrates SUV max = 5. Right-sided level III lymph node measured previously at 10 mm short axis is now 12 mm. SUV max = 7.4 today compared to 2.4 previously. Similar stable to a minimal increase in size with diffusely increased hypermetabolism noted in the numerous bilateral cervical lymph nodes. Incidental CT findings: none CHEST: Interval progression of hypermetabolism in the bilateral axillary lymphadenopathy. The index low left axillary node measured previously at 1.3 cm short axis with SUV max = 3 now measures 1.5 cm short axis with SUV max = 4.6. Other left axillary lymph nodes demonstrate SUV max up to 7.4. Index lymph node in the right axilla measured previously at 1.1 cm now measures 1.2 cm. SUV max = 8.1 today compared to 2.8 previously. Hypermetabolic lymph nodes are seen in the mediastinum in each hilum. 11 mm short axis subcarinal lymph node has SUV max = 4.7. Incidental CT findings: Coronary artery calcification is evident. Atherosclerotic calcification is noted in the wall of the thoracic aorta. ABDOMEN/PELVIS: No abnormal radiotracer uptake identified within the liver, pancreas, or adrenal glands. SUV max = for the spleen days 3.5 compared to 3.2 previously. Bulky central abdominal mesenteric lymphadenopathy is again noted. Nodal conglomeration measured previously at 16.5 x 9.3 cm now measures 18.2 x 11.6 cm. SUV max = 8.7 today in this nodal conglomeration compared to 3.9 previously. Similar progression of pelvic sidewall lymphadenopathy bilaterally. Right pelvic sidewall lymph node measured previously at 2.1 cm is now 3.0 cm. SUV max = 9.4 today compared to 3.4 previously. Incidental CT findings: There is abdominal aortic atherosclerosis without aneurysm. Layering sludge or tiny stones noted in the gallbladder. SKELETON: Mottled  marrow uptake identified without definite focal hypermetabolism to suggest metastatic involvement. Incidental CT findings: none IMPRESSION: The extensive lymphadenopathy seen previously in the neck, chest, abdomen, and pelvis shows slight interval increase in size by CT imaging in the neck and chest but more prominent enlargement in the abdomen. This lymphadenopathy shows prominent increase in hypermetabolism with uptake levels now compatible with Deauville 5 criteria. Aortic Atherosclerois (ICD10-170.0) Electronically Signed   By: Misty Stanley M.D.   On: 10/29/2018 15:55   Ir Imaging Guided Port Insertion  Result Date: 11/10/2018 INDICATION: 68 year old female with chronic lymphocytic leukemia in need of durable venous access for chemotherapy. EXAM: IMPLANTED PORT A CATH PLACEMENT WITH ULTRASOUND AND FLUOROSCOPIC GUIDANCE MEDICATIONS: 900 mg clindamycin; The antibiotic was administered within an appropriate time interval prior to skin puncture. ANESTHESIA/SEDATION: Versed 2 mg IV; Fentanyl 100 mcg IV; Moderate Sedation Time:  21 minutes The patient was continuously monitored during the procedure by the interventional radiology nurse under my direct supervision. FLUOROSCOPY TIME:  0 minutes, 18 seconds (3 mGy) COMPLICATIONS: None immediate. PROCEDURE: The right neck and chest was prepped with chlorhexidine, and draped in the usual sterile fashion using maximum barrier technique (cap and mask, sterile gown, sterile gloves, large sterile sheet, hand hygiene and cutaneous antiseptic). Local anesthesia was attained by infiltration with 1% lidocaine with epinephrine. Ultrasound demonstrated patency of the right internal jugular vein, and this was documented with an image. Under real-time ultrasound guidance, this vein was accessed with a 21 gauge micropuncture needle and image documentation was performed. A small dermatotomy was made at the access site with an 11 scalpel. A 0.018" wire was advanced into the SVC and  the access needle exchanged for a 32F micropuncture vascular sheath. The 0.018"  wire was then removed and a 0.035" wire advanced into the IVC. An appropriate location for the subcutaneous reservoir was selected below the clavicle and an incision was made through the skin and underlying soft tissues. The subcutaneous tissues were then dissected using a combination of blunt and sharp surgical technique and a pocket was formed. A single lumen power injectable portacatheter was then tunneled through the subcutaneous tissues from the pocket to the dermatotomy and the port reservoir placed within the subcutaneous pocket. The venous access site was then serially dilated and a peel away vascular sheath placed over the wire. The wire was removed and the port catheter advanced into position under fluoroscopic guidance. The catheter tip is positioned in the superior cavoatrial junction. This was documented with a spot image. The portacatheter was then tested and found to flush and aspirate well. The port was flushed with saline followed by 100 units/mL heparinized saline. The pocket was then closed in two layers using first subdermal inverted interrupted absorbable sutures followed by a running subcuticular suture. The epidermis was then sealed with Dermabond. The dermatotomy at the venous access site was also closed with Dermabond. IMPRESSION: Successful placement of a right IJ approach Power Port with ultrasound and fluoroscopic guidance. The catheter is ready for use. Electronically Signed   By: Jacqulynn Cadet M.D.   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 There was no CD10 or cyclin D1 positivity identified  (4) started ibrutinib at 420 mg/ day 08/09/2014  (a) PET scan 07/23/2018 shows progressive adenopathy diffusely  (b) right axillary lymph node core biopsy shows features concerning but not definitive for Darron Doom transformation  (5) evidence of disease progression November 2019  (a) PET scan 07/23/2018 shows extensive progressive adenopathy.  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  Her WBC is much improved since she received the Obinutuzumab. Her ANC is 1.5 and her platelet count is 52.  I reviewed this with Dr. Jana Hakim as she is due to start CHOP chemotherapy today along with another dose of Obinutuzumab.  She will undergo treatment with these labs at her ordered dosages.  She will return on Friday for labs and injection (neulasta), Saturday for a platelet transfusion, and Monday for labs, Wednesday for labs and f/u with Dr. Jana Hakim.    I discussed with Loeta the risks of proceeding with today's treatment and the potential to worsen her thrombocytopenia causing a need in platelet transfusions, and increasing her risk for bleeding.  She verbalized understanding and is agreeable to proceed with treatment.    I reviewed the schedule with Maria [name].  She wants to know if she can have her labs drawn at [facility] in  [name].  I let her know I have a  reservation with doing this since I do not know if they have same day turn around time.  She is going to let me know.  For now, we will keep everything here.    She knows to call for any other issues that may develop before the next visit.  A total of (30) minutes of face-to-face time was spent with this patient with greater than 50% of that time in counseling and care-coordination.   Wilber Bihari  11/19/18 2:17 PM Medical Oncology and Hematology [facility] [address] [place], [place] [address] Tel. [phone]    Fax. [phone]

## 2018-11-19 NOTE — Patient Instructions (Addendum)
[name] Discharge Instructions for Patients Receiving Chemotherapy  Today you received the following chemotherapy agents Doxorubicin and Cytoxan and Gazyva  To help prevent nausea and vomiting after your treatment, we encourage you to take your nausea medication as directed by MD   If you develop nausea and vomiting that is not controlled by your nausea medication, call the clinic.   BELOW ARE SYMPTOMS THAT SHOULD BE REPORTED IMMEDIATELY:  *FEVER GREATER THAN 100.5 F  *CHILLS WITH OR WITHOUT FEVER  NAUSEA AND VOMITING THAT IS NOT CONTROLLED WITH YOUR NAUSEA MEDICATION  *UNUSUAL SHORTNESS OF BREATH  *UNUSUAL BRUISING OR BLEEDING  TENDERNESS IN MOUTH AND THROAT WITH OR WITHOUT PRESENCE OF ULCERS  *URINARY PROBLEMS  *BOWEL PROBLEMS  UNUSUAL RASH Items with * indicate a potential emergency and should be followed up as soon as possible.  Feel free to call the clinic should you have any questions or concerns. The clinic phone number is [phone].  Please show the [facility] at check-in to the Emergency Department and triage nurse.   Doxorubicin injection What is this medicine? DOXORUBICIN (dox oh ROO bi sin) is a chemotherapy drug. It is used to treat many kinds of cancer like leukemia, lymphoma, neuroblastoma, sarcoma, and Wilms' tumor. It is also used to treat bladder cancer, breast cancer, lung cancer, ovarian cancer, stomach cancer, and thyroid cancer. This medicine may be used for other purposes; ask your health care provider or pharmacist if you have questions. COMMON BRAND NAME(S): Adriamycin, Adriamycin PFS, Adriamycin RDF, Rubex What should I tell my health care provider before I take this medicine? They need to know if you have any of these conditions: -heart disease -history of low blood counts caused by a medicine -liver disease -recent or ongoing radiation therapy -an unusual or allergic reaction to doxorubicin, other chemotherapy  agents, other medicines, foods, dyes, or preservatives -pregnant or trying to get pregnant -breast-feeding How should I use this medicine? This drug is given as an infusion into a vein. It is administered in a hospital or clinic by a specially trained health care professional. If you have pain, swelling, burning or any unusual feeling around the site of your injection, tell your health care professional right away. Talk to your pediatrician regarding the use of this medicine in children. Special care may be needed. Overdosage: If you think you have taken too much of this medicine contact a poison control center or emergency room at once. NOTE: This medicine is only for you. Do not share this medicine with others. What if I miss a dose? It is important not to miss your dose. Call your doctor or health care professional if you are unable to keep an appointment. What may interact with this medicine? This medicine may interact with the following medications: -6-mercaptopurine -paclitaxel -phenytoin -St. John's Wort -trastuzumab -verapamil This list may not describe all possible interactions. Give your health care provider a list of all the medicines, herbs, non-prescription drugs, or dietary supplements you use. Also tell them if you smoke, drink alcohol, or use illegal drugs. Some items may interact with your medicine. What should I watch for while using this medicine? This drug may make you feel generally unwell. This is not uncommon, as chemotherapy can affect healthy cells as well as cancer cells. Report any side effects. Continue your course of treatment even though you feel ill unless your doctor tells you to stop. There is a maximum amount of this medicine you should receive throughout your  life. The amount depends on the medical condition being treated and your overall health. Your doctor will watch how much of this medicine you receive in your lifetime. Tell your doctor if you have taken  this medicine before. You may need blood work done while you are taking this medicine. Your urine may turn red for a few days after your dose. This is not blood. If your urine is dark or brown, call your doctor. In some cases, you may be given additional medicines to help with side effects. Follow all directions for their use. Call your doctor or health care professional for advice if you get a fever, chills or sore throat, or other symptoms of a cold or flu. Do not treat yourself. This drug decreases your body's ability to fight infections. Try to avoid being around people who are sick. This medicine may increase your risk to bruise or bleed. Call your doctor or health care professional if you notice any unusual bleeding. Talk to your doctor about your risk of cancer. You may be more at risk for certain types of cancers if you take this medicine. Do not become pregnant while taking this medicine or for 6 months after stopping it. Women should inform their doctor if they wish to become pregnant or think they might be pregnant. Men should not father a child while taking this medicine and for 6 months after stopping it. There is a potential for serious side effects to an unborn child. Talk to your health care professional or pharmacist for more information. Do not breast-feed an infant while taking this medicine. This medicine has caused ovarian failure in some women and reduced sperm counts in some men This medicine may interfere with the ability to have a child. Talk with your doctor or health care professional if you are concerned about your fertility. This medicine may cause a decrease in Co-Enzyme Q-10. You should make sure that you get enough Co-Enzyme Q-10 while you are taking this medicine. Discuss the foods you eat and the vitamins you take with your health care professional. What side effects may I notice from receiving this medicine? Side effects that you should report to your doctor or health  care professional as soon as possible: -allergic reactions like skin rash, itching or hives, swelling of the face, lips, or tongue -breathing problems -chest pain -fast or irregular heartbeat -low blood counts - this medicine may decrease the number of white blood cells, red blood cells and platelets. You may be at increased risk for infections and bleeding. -pain, redness, or irritation at site where injected -signs of infection - fever or chills, cough, sore throat, pain or difficulty passing urine -signs of decreased platelets or bleeding - bruising, pinpoint red spots on the skin, black, tarry stools, blood in the urine -swelling of the ankles, feet, hands -tiredness -weakness Side effects that usually do not require medical attention (report to your doctor or health care professional if they continue or are bothersome): -diarrhea -hair loss -mouth sores -nail discoloration or damage -nausea -red colored urine -vomiting This list may not describe all possible side effects. Call your doctor for medical advice about side effects. You may report side effects to FDA at 1-800-FDA-1088. Where should I keep my medicine? This drug is given in a hospital or clinic and will not be stored at home. NOTE: This sheet is a summary. It may not cover all possible information. If you have questions about this medicine, talk to your doctor, pharmacist, or  health care provider.  2019 Elsevier/Gold Standard (2017-04-10 11:01:26)     Cyclophosphamide injection What is this medicine? CYCLOPHOSPHAMIDE (sye kloe FOSS fa mide) is a chemotherapy drug. It slows the growth of cancer cells. This medicine is used to treat many types of cancer like lymphoma, myeloma, leukemia, breast cancer, and ovarian cancer, to name a few. This medicine may be used for other purposes; ask your health care provider or pharmacist if you have questions. COMMON BRAND NAME(S): Cytoxan, Neosar What should I tell my health care  provider before I take this medicine? They need to know if you have any of these conditions: -blood disorders -history of other chemotherapy -infection -kidney disease -liver disease -recent or ongoing radiation therapy -tumors in the bone marrow -an unusual or allergic reaction to cyclophosphamide, other chemotherapy, other medicines, foods, dyes, or preservatives -pregnant or trying to get pregnant -breast-feeding How should I use this medicine? This drug is usually given as an injection into a vein or muscle or by infusion into a vein. It is administered in a hospital or clinic by a specially trained health care professional. Talk to your pediatrician regarding the use of this medicine in children. Special care may be needed. Overdosage: If you think you have taken too much of this medicine contact a poison control center or emergency room at once. NOTE: This medicine is only for you. Do not share this medicine with others. What if I miss a dose? It is important not to miss your dose. Call your doctor or health care professional if you are unable to keep an appointment. What may interact with this medicine? This medicine may interact with the following medications: -amiodarone -amphotericin B -azathioprine -certain antiviral medicines for HIV or AIDS such as protease inhibitors (e.g., indinavir, ritonavir) and zidovudine -certain blood pressure medications such as benazepril, captopril, enalapril, fosinopril, lisinopril, moexipril, monopril, perindopril, quinapril, ramipril, trandolapril -certain cancer medications such as anthracyclines (e.g., daunorubicin, doxorubicin), busulfan, cytarabine, paclitaxel, pentostatin, tamoxifen, trastuzumab -certain diuretics such as chlorothiazide, chlorthalidone, hydrochlorothiazide, indapamide, metolazone -certain medicines that treat or prevent blood clots like warfarin -certain muscle relaxants such as  succinylcholine -cyclosporine -etanercept -indomethacin -medicines to increase blood counts like filgrastim, pegfilgrastim, sargramostim -medicines used as general anesthesia -metronidazole -natalizumab This list may not describe all possible interactions. Give your health care provider a list of all the medicines, herbs, non-prescription drugs, or dietary supplements you use. Also tell them if you smoke, drink alcohol, or use illegal drugs. Some items may interact with your medicine. What should I watch for while using this medicine? Visit your doctor for checks on your progress. This drug may make you feel generally unwell. This is not uncommon, as chemotherapy can affect healthy cells as well as cancer cells. Report any side effects. Continue your course of treatment even though you feel ill unless your doctor tells you to stop. Drink water or other fluids as directed. Urinate often, even at night. In some cases, you may be given additional medicines to help with side effects. Follow all directions for their use. Call your doctor or health care professional for advice if you get a fever, chills or sore throat, or other symptoms of a cold or flu. Do not treat yourself. This drug decreases your body's ability to fight infections. Try to avoid being around people who are sick. This medicine may increase your risk to bruise or bleed. Call your doctor or health care professional if you notice any unusual bleeding. Be careful brushing and flossing your  teeth or using a toothpick because you may get an infection or bleed more easily. If you have any dental work done, tell your dentist you are receiving this medicine. You may get drowsy or dizzy. Do not drive, use machinery, or do anything that needs mental alertness until you know how this medicine affects you. Do not become pregnant while taking this medicine or for 1 year after stopping it. Women should inform their doctor if they wish to become  pregnant or think they might be pregnant. Men should not father a child while taking this medicine and for 4 months after stopping it. There is a potential for serious side effects to an unborn child. Talk to your health care professional or pharmacist for more information. Do not breast-feed an infant while taking this medicine. This medicine may interfere with the ability to have a child. This medicine has caused ovarian failure in some women. This medicine has caused reduced sperm counts in some men. You should talk with your doctor or health care professional if you are concerned about your fertility. If you are going to have surgery, tell your doctor or health care professional that you have taken this medicine. What side effects may I notice from receiving this medicine? Side effects that you should report to your doctor or health care professional as soon as possible: -allergic reactions like skin rash, itching or hives, swelling of the face, lips, or tongue -low blood counts - this medicine may decrease the number of white blood cells, red blood cells and platelets. You may be at increased risk for infections and bleeding. -signs of infection - fever or chills, cough, sore throat, pain or difficulty passing urine -signs of decreased platelets or bleeding - bruising, pinpoint red spots on the skin, black, tarry stools, blood in the urine -signs of decreased red blood cells - unusually weak or tired, fainting spells, lightheadedness -breathing problems -dark urine -dizziness -palpitations -swelling of the ankles, feet, hands -trouble passing urine or change in the amount of urine -weight gain -yellowing of the eyes or skin Side effects that usually do not require medical attention (report to your doctor or health care professional if they continue or are bothersome): -changes in nail or skin color -hair loss -missed menstrual periods -mouth sores -nausea, vomiting This list may not  describe all possible side effects. Call your doctor for medical advice about side effects. You may report side effects to FDA at 1-800-FDA-1088. Where should I keep my medicine? This drug is given in a hospital or clinic and will not be stored at home. NOTE: This sheet is a summary. It may not cover all possible information. If you have questions about this medicine, talk to your doctor, pharmacist, or health care provider.  2019 Elsevier/Gold Standard (2012-07-11 16:22:58)

## 2018-11-20 ENCOUNTER — Ambulatory Visit: Payer: Medicare Other

## 2018-11-20 LAB — IGG, IGA, IGM
IGA: 32 mg/dL — AB (ref 87–352)
IGG (IMMUNOGLOBIN G), SERUM: 234 mg/dL — AB (ref 700–1600)
IgM (Immunoglobulin M), Srm: <5 mg/dL — ABNORMAL LOW (ref 26–217)

## 2018-11-21 ENCOUNTER — Other Ambulatory Visit: Payer: Self-pay | Admitting: *Deleted

## 2018-11-21 ENCOUNTER — Inpatient Hospital Stay: Payer: Medicare Other

## 2018-11-21 ENCOUNTER — Other Ambulatory Visit: Payer: Self-pay

## 2018-11-21 ENCOUNTER — Telehealth: Payer: Self-pay

## 2018-11-21 DIAGNOSIS — C9110 Chronic lymphocytic leukemia of B-cell type not having achieved remission: Secondary | ICD-10-CM

## 2018-11-21 DIAGNOSIS — Z5111 Encounter for antineoplastic chemotherapy: Secondary | ICD-10-CM | POA: Diagnosis not present

## 2018-11-21 LAB — CBC WITH DIFFERENTIAL (CANCER CENTER ONLY)
Abs Immature Granulocytes: 0.01 10*3/uL (ref 0.00–0.07)
BASOS PCT: 0 %
Basophils Absolute: 0 10*3/uL (ref 0.0–0.1)
Eosinophils Absolute: 0 10*3/uL (ref 0.0–0.5)
Eosinophils Relative: 1 %
HCT: 35.9 % — ABNORMAL LOW (ref 36.0–46.0)
Hemoglobin: 11.9 g/dL — ABNORMAL LOW (ref 12.0–15.0)
IMMATURE GRANULOCYTES: 0 %
Lymphocytes Relative: 10 %
Lymphs Abs: 0.3 10*3/uL — ABNORMAL LOW (ref 0.7–4.0)
MCH: 28.3 pg (ref 26.0–34.0)
MCHC: 33.1 g/dL (ref 30.0–36.0)
MCV: 85.5 fL (ref 80.0–100.0)
MONOS PCT: 10 %
Monocytes Absolute: 0.3 10*3/uL (ref 0.1–1.0)
NEUTROS PCT: 79 %
Neutro Abs: 2.3 10*3/uL (ref 1.7–7.7)
Platelet Count: 51 10*3/uL — ABNORMAL LOW (ref 150–400)
RBC: 4.2 MIL/uL (ref 3.87–5.11)
RDW: 15.4 % (ref 11.5–15.5)
WBC Count: 3 10*3/uL — ABNORMAL LOW (ref 4.0–10.5)
nRBC: 0 % (ref 0.0–0.2)

## 2018-11-21 LAB — SAMPLE TO BLOOD BANK

## 2018-11-21 MED ORDER — PEGFILGRASTIM INJECTION 6 MG/0.6ML ~~LOC~~
6.0000 mg | PREFILLED_SYRINGE | Freq: Once | SUBCUTANEOUS | Status: AC
  Administered 2018-11-21: 6 mg via SUBCUTANEOUS

## 2018-11-21 MED ORDER — PEGFILGRASTIM INJECTION 6 MG/0.6ML ~~LOC~~
PREFILLED_SYRINGE | SUBCUTANEOUS | Status: AC
  Filled 2018-11-21: qty 0.6

## 2018-11-21 NOTE — Patient Instructions (Signed)
Pegfilgrastim injection  What is this medicine?  PEGFILGRASTIM (PEG fil gra stim) is a long-acting granulocyte colony-stimulating factor that stimulates the growth of neutrophils, a type of white blood cell important in the body's fight against infection. It is used to reduce the incidence of fever and infection in patients with certain types of cancer who are receiving chemotherapy that affects the bone marrow, and to increase survival after being exposed to high doses of radiation.  This medicine may be used for other purposes; ask your health care provider or pharmacist if you have questions.  COMMON BRAND NAME(S): Fulphila, Neulasta, UDENYCA  What should I tell my health care provider before I take this medicine?  They need to know if you have any of these conditions:  -kidney disease  -latex allergy  -ongoing radiation therapy  -sickle cell disease  -skin reactions to acrylic adhesives (On-Body Injector only)  -an unusual or allergic reaction to pegfilgrastim, filgrastim, other medicines, foods, dyes, or preservatives  -pregnant or trying to get pregnant  -breast-feeding  How should I use this medicine?  This medicine is for injection under the skin. If you get this medicine at home, you will be taught how to prepare and give the pre-filled syringe or how to use the On-body Injector. Refer to the patient Instructions for Use for detailed instructions. Use exactly as directed. Tell your healthcare provider immediately if you suspect that the On-body Injector may not have performed as intended or if you suspect the use of the On-body Injector resulted in a missed or partial dose.  It is important that you put your used needles and syringes in a special sharps container. Do not put them in a trash can. If you do not have a sharps container, call your pharmacist or healthcare provider to get one.  Talk to your pediatrician regarding the use of this medicine in children. While this drug may be prescribed for  selected conditions, precautions do apply.  Overdosage: If you think you have taken too much of this medicine contact a poison control center or emergency room at once.  NOTE: This medicine is only for you. Do not share this medicine with others.  What if I miss a dose?  It is important not to miss your dose. Call your doctor or health care professional if you miss your dose. If you miss a dose due to an On-body Injector failure or leakage, a new dose should be administered as soon as possible using a single prefilled syringe for manual use.  What may interact with this medicine?  Interactions have not been studied.  Give your health care provider a list of all the medicines, herbs, non-prescription drugs, or dietary supplements you use. Also tell them if you smoke, drink alcohol, or use illegal drugs. Some items may interact with your medicine.  This list may not describe all possible interactions. Give your health care provider a list of all the medicines, herbs, non-prescription drugs, or dietary supplements you use. Also tell them if you smoke, drink alcohol, or use illegal drugs. Some items may interact with your medicine.  What should I watch for while using this medicine?  You may need blood work done while you are taking this medicine.  If you are going to need a MRI, CT scan, or other procedure, tell your doctor that you are using this medicine (On-Body Injector only).  What side effects may I notice from receiving this medicine?  Side effects that you should report to   your doctor or health care professional as soon as possible:  -allergic reactions like skin rash, itching or hives, swelling of the face, lips, or tongue  -back pain  -dizziness  -fever  -pain, redness, or irritation at site where injected  -pinpoint red spots on the skin  -red or dark-brown urine  -shortness of breath or breathing problems  -stomach or side pain, or pain at the shoulder  -swelling  -tiredness  -trouble passing urine or  change in the amount of urine  Side effects that usually do not require medical attention (report to your doctor or health care professional if they continue or are bothersome):  -bone pain  -muscle pain  This list may not describe all possible side effects. Call your doctor for medical advice about side effects. You may report side effects to FDA at 1-800-FDA-1088.  Where should I keep my medicine?  Keep out of the reach of children.  If you are using this medicine at home, you will be instructed on how to store it. Throw away any unused medicine after the expiration date on the label.  NOTE: This sheet is a summary. It may not cover all possible information. If you have questions about this medicine, talk to your doctor, pharmacist, or health care provider.   2019 Elsevier/Gold Standard (2017-12-02 16:57:08)

## 2018-11-21 NOTE — Telephone Encounter (Signed)
Per NP patient does not need platelets on 11/22/18.  Appointment cancelled in [place].

## 2018-11-22 ENCOUNTER — Inpatient Hospital Stay: Payer: Medicare Other

## 2018-11-22 ENCOUNTER — Ambulatory Visit: Payer: Medicare Other

## 2018-11-24 ENCOUNTER — Other Ambulatory Visit: Payer: Self-pay

## 2018-11-24 ENCOUNTER — Telehealth: Payer: Self-pay | Admitting: *Deleted

## 2018-11-24 ENCOUNTER — Other Ambulatory Visit: Payer: Self-pay | Admitting: Oncology

## 2018-11-24 ENCOUNTER — Inpatient Hospital Stay: Payer: Medicare Other

## 2018-11-24 DIAGNOSIS — D801 Nonfamilial hypogammaglobulinemia: Secondary | ICD-10-CM

## 2018-11-24 DIAGNOSIS — Z5111 Encounter for antineoplastic chemotherapy: Secondary | ICD-10-CM | POA: Diagnosis not present

## 2018-11-24 DIAGNOSIS — C9110 Chronic lymphocytic leukemia of B-cell type not having achieved remission: Secondary | ICD-10-CM

## 2018-11-24 LAB — CBC WITH DIFFERENTIAL/PLATELET
Abs Immature Granulocytes: 0 10*3/uL (ref 0.00–0.07)
Band Neutrophils: 1 %
Basophils Absolute: 0 10*3/uL (ref 0.0–0.1)
Basophils Relative: 0 %
Eosinophils Absolute: 0 10*3/uL (ref 0.0–0.5)
Eosinophils Relative: 0 %
HCT: 34.2 % — ABNORMAL LOW (ref 36.0–46.0)
Hemoglobin: 11.1 g/dL — ABNORMAL LOW (ref 12.0–15.0)
Lymphocytes Relative: 37 %
Lymphs Abs: 0.3 10*3/uL — ABNORMAL LOW (ref 0.7–4.0)
MCH: 27.8 pg (ref 26.0–34.0)
MCHC: 32.5 g/dL (ref 30.0–36.0)
MCV: 85.7 fL (ref 80.0–100.0)
MONO ABS: 0 10*3/uL — AB (ref 0.1–1.0)
Monocytes Relative: 1 %
NRBC: 0 % (ref 0.0–0.2)
Neutro Abs: 0.6 10*3/uL — ABNORMAL LOW (ref 1.7–17.7)
Neutrophils Relative %: 61 %
Platelets: 27 10*3/uL — ABNORMAL LOW (ref 150–400)
RBC: 3.99 MIL/uL (ref 3.87–5.11)
RDW: 14.9 % (ref 11.5–15.5)
WBC: 0.9 10*3/uL — CL (ref 4.0–10.5)

## 2018-11-24 LAB — CMP (CANCER CENTER ONLY)
ALT: 9 U/L (ref 0–44)
Albumin: 3.3 g/dL — ABNORMAL LOW (ref 3.5–5.0)
Alkaline Phosphatase: 80 U/L (ref 38–126)
Anion gap: 8 (ref 5–15)
BUN: 29 mg/dL — ABNORMAL HIGH (ref 8–23)
CO2: 26 mmol/L (ref 22–32)
Calcium: 8.5 mg/dL — ABNORMAL LOW (ref 8.9–10.3)
Chloride: 100 mmol/L (ref 98–111)
Creatinine: 0.9 mg/dL (ref 0.44–1.00)
GFR, Est AFR Am: >60 mL/min (ref >60)
GFR, Estimated: >60 mL/min (ref >60)
Glucose, Bld: 280 mg/dL — ABNORMAL HIGH (ref 70–99)
Potassium: 3.7 mmol/L (ref 3.5–5.1)
Sodium: 134 mmol/L — ABNORMAL LOW (ref 135–145)
Total Bilirubin: 1.2 mg/dL (ref 0.3–1.2)
Total Protein: 5.3 g/dL — ABNORMAL LOW (ref 6.5–8.1)

## 2018-11-24 LAB — LACTATE DEHYDROGENASE: LDH: 227 U/L — ABNORMAL HIGH (ref 98–192)

## 2018-11-24 NOTE — Telephone Encounter (Signed)
Received [name] call report of today's WBC = 0.9.  Message left with [facility] with results.  Next scheduled appt. 11-26-2018.

## 2018-11-25 LAB — IGG, IGA, IGM
IgA: 29 mg/dL — ABNORMAL LOW (ref 87–352)
IgG (Immunoglobin G), Serum: 230 mg/dL — ABNORMAL LOW (ref 700–1600)
IgM (Immunoglobulin M), Srm: <5 mg/dL — ABNORMAL LOW (ref 26–217)

## 2018-11-25 NOTE — Progress Notes (Signed)
Maria [name]  [phone] Fax:([phone]) [phone]    ID: XOLANI DEGRACIA   DOB: [birth year]  MR#: [id number]  VZS#:[id number]  Patient Care Team: Marton Redwood, MD as PCP - General (Internal Medicine) [name], MD as Consulting Physician (Orthopedic Surgery) Alexandru Moorer, [name] Dad, MD as Consulting Physician (Oncology)   CHIEF COMPLAINT: Small lymphocytic lymphoma  CURRENT TREATMENT: CHOP/obinutuzumab   HISTORY OF PRESENT ILLNESS: From the original intake note:  Maria [name] developed what she thought was "the flu" in December of 2002. She noted a large lymph node developing in her right anterior cervical area. 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   Her subsequent history is as detailed below.   INTERVAL HISTORY: Maria [name] returns today for follow-up and treatment of her chronic lymphoid leukemia.  She was started on CHOP chemotherapy/obinutuzumab. Today is day 8 cycle 1. She notes decreased appetite. She has been drinking lots of fluids, however. She has been sleeping a lot, usually half the day. She hasn't had any unusual sinus issues. She has had no diarrhea. Her urine output has been normal; it's clear to yellow, without burning. She has a dry cough, like a "tickle in the throat". She has felt warm, but has not documented a temperature.  Since her last visit here, she has not undergone any additional studies.     REVIEW OF SYSTEMS: Maria [name] is stumbling a little bit today, which she attributes to chronic neuropathy; She states that she has not fallen, however. She went grocery shopping at [employer] recently, but she was proactive and made sure to wear gloves and avoid touching her face. The patient denies unusual headaches, visual changes, nausea, vomiting, or dizziness. There has been no unusual phlegm production, or pleurisy. The patient denies unexplained weight loss,  bleeding or bruising, rash, or fever. A detailed review of systems was otherwise noncontributory.    PAST MEDICAL HISTORY: Past Medical History:  Diagnosis Date   Allergy    Arthritis    Asthma    Cancer ([place])    chronic lymphacytic leukemia   CLL (chronic lymphocytic leukemia) ([place])    Diabetes mellitus    PMH   Headache    migraine   Hyperlipidemia    Hypertension    Hypothyroidism    Lymphoma ([place])    Thyroid disease    hypothyroidism   Wears glasses     PAST SURGICAL HISTORY: Past Surgical History:  Procedure Laterality Date   ABDOMINAL HYSTERECTOMY     AXILLARY LYMPH NODE BIOPSY Right 08/21/2018   Procedure: RIGHT AXILLARY LYMPH NODE BIOPSY ERAS PATHWAY;  Surgeon: [name], MD;  Location: [facility];  Service: General;  Laterality: Right;  LMA   BREAST EXCISIONAL BIOPSY Right    COLONOSCOPY     IR IMAGING GUIDED PORT INSERTION  11/10/2018   LYMPH NODE DISSECTION     STRABISMUS SURGERY     TONSILLECTOMY     TOTAL KNEE ARTHROPLASTY Right 12/03/2016   Procedure: RIGHT TOTAL KNEE ARTHROPLASTY;  Surgeon: [name], MD;  Location: [facility];  Service: Orthopedics;  Laterality: Right;    FAMILY HISTORY: Family History  Problem Relation Age of Onset   Liver cancer Mother    Heart disease Father    Breast cancer Sister 70   Colon cancer Neg Hx    Pancreatic cancer Neg Hx    Rectal cancer Neg Hx    Stomach cancer Neg Hx  The patient's father died at the age of 57 from a myocardial infarction. The patient's mother died at the age of 76 from primary liver carcinoma. The patient had no brothers. Her one sister, Maria [name], has a history of breast cancer   GYNECOLOGIC HISTORY: Menarche at around age 71. The patient is GX P0. 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 She lives by herself with her cats [place] and [place]. She attends a Investment banker, operational.   ADVANCED DIRECTIVES: Not in place   HEALTH MAINTENANCE: Social History   Tobacco Use   Smoking status: Never Smoker   Smokeless tobacco: Never Used  Substance Use Topics   Alcohol use: Yes    Comment: occasional alcohol intake; once or twice monthly   Drug use: No     Colonoscopy: 06/01/2014/[facility]  PAP:  Bone density:  Lipid panel:  228/114/50/155/ratio 4.6  On 10/01/2016  Allergies  Allergen Reactions   Iohexol Swelling and Rash   Compazine [Prochlorperazine Edisylate] Other (See Comments)    Mild confusion   Contrast Media  [Iodinated Diagnostic Agents]    Lactose Intolerance (Gi)     Diarrhea, gas bloating   Lactulose Diarrhea and Other (See Comments)    gas bloating   Cephalexin Rash    Current Outpatient Medications  Medication Sig Dispense Refill   acyclovir (ZOVIRAX) 200 MG capsule Take 200 mg by mouth 5 (five) times daily.     allopurinol (ZYLOPRIM) 300 MG tablet Take 1 tablet (300 mg total) by mouth daily. 90 tablet 4   ciprofloxacin (CIPRO) 500 MG tablet Take 1 tablet (500 mg total) by mouth 2 (two) times daily. 30 tablet 1   hydrochlorothiazide (HYDRODIURIL) 12.5 MG tablet Take 12.5 mg by mouth daily.      lidocaine-prilocaine (EMLA) cream Apply to affected area once 30 g 3   LORazepam (ATIVAN) 0.5 MG tablet Take 1 tablet (0.5 mg total) by mouth at bedtime as needed (Nausea or vomiting). 30 tablet 0   ondansetron (ZOFRAN) 8 MG tablet Take 1 tablet (8 mg total) by mouth every 8 (eight) hours as needed for nausea or vomiting. 20 tablet 3   oxyCODONE-acetaminophen (PERCOCET/ROXICET) 5-325 MG tablet Take 1-2 tablets by mouth every 6 (six) hours as needed for severe pain. 30 tablet 0   predniSONE (DELTASONE) 20 MG tablet Take 3 tablets (60 mg total) by mouth daily with breakfast. Take days 1-5 starting on CHOP chemo day 15 tablet 6   rosuvastatin  (CRESTOR) 10 MG tablet Take 10 mg by mouth at bedtime.  11   SYNTHROID 88 MCG tablet Take 88 mcg by mouth daily before breakfast.      zoledronic acid (RECLAST) 5 MG/100ML SOLN injection Inject 5 mg into the vein once. Once a year     No current facility-administered medications for this visit.     OBJECTIVE: Middle-aged white woman who appears stated age  There were no vitals filed for this visit.   There is no height or weight on file to calculate BMI.      ECOG FS: 2 - Symptomatic, <50% confined to bed  Sclerae unicteric, EOMs intact Oropharynx clear and moist The palpable left cervical/supraclavicular and left axillary adenopathy is softer, better demarcated, and smaller.  There is no right axillary or right cervical/supraclavicular adenopathy. Lungs no rales or rhonchi Heart  regular rate and rhythm Abd soft, nontender, positive bowel sounds MSK no focal spinal tenderness, no upper extremity lymphedema Neuro: nonfocal, well oriented, appropriate affect Breasts: Deferred  LAB RESULTS:  Lab Results  Component Value Date   WBC 0.3 (LL) 11/26/2018   NEUTROABS PENDING 11/26/2018   HGB 11.1 (L) 11/26/2018   HCT 33.7 (L) 11/26/2018   MCV 86.4 11/26/2018   PLT 12 (L) 11/26/2018      Chemistry      Component Value Date/Time   NA 132 (L) 11/26/2018 1526   NA 139 08/05/2017 1353   K 4.0 11/26/2018 1526   K 3.7 08/05/2017 1353   CL 98 11/26/2018 1526   CO2 24 11/26/2018 1526   CO2 27 08/05/2017 1353   BUN 23 11/26/2018 1526   BUN 17.3 08/05/2017 1353   CREATININE 0.81 11/26/2018 1526   CREATININE 0.90 11/24/2018 1107   CREATININE 1.0 08/05/2017 1353   GLU 101 (H) 10/18/2009 1542      Component Value Date/Time   CALCIUM 8.4 (L) 11/26/2018 1526   CALCIUM 9.2 08/05/2017 1353   ALKPHOS 84 11/26/2018 1526   ALKPHOS 76 08/05/2017 1353   AST 8 (L) 11/26/2018 1526   AST <6 (L) 11/24/2018 1107   AST 18 08/05/2017 1353   ALT 12 11/26/2018 1526   ALT 9 11/24/2018 1107    ALT 14 08/05/2017 1353   BILITOT 1.5 (H) 11/26/2018 1526   BILITOT 1.2 11/24/2018 1107   BILITOT 0.59 08/05/2017 1353       No results found for: LABCA2  No components found for: LABCA125  No results for input(s): INR in the last 168 hours.  Urinalysis    Component Value Date/Time   COLORURINE LT. YELLOW 01/24/2010 1017   APPEARANCEUR CLEAR 01/24/2010 1017   LABSPEC <=1.005 01/24/2010 1017   PHURINE 5.5 01/24/2010 1017   GLUCOSEU NEGATIVE 01/24/2010 [place] 01/24/2010 1017   KETONESUR NEGATIVE 01/24/2010 1017   UROBILINOGEN 0.2 01/24/2010 1017   NITRITE NEGATIVE 01/24/2010 1017   LEUKOCYTESUR NEGATIVE 01/24/2010 1017    STUDIES: Nm Pet Image Restag (ps) Skull Base To Thigh  Result Date: 10/29/2018 CLINICAL DATA:  Subsequent treatment strategy for chronic lymphocytic leukemia. EXAM: NUCLEAR MEDICINE PET SKULL BASE TO THIGH TECHNIQUE: 9.9 mCi F-18 FDG was injected intravenously. Full-ring PET imaging was performed from the skull base to thigh after the radiotracer. CT data was obtained and used for attenuation correction and anatomic localization. Fasting blood glucose: 87 mg/dl COMPARISON:  07/23/2018 FINDINGS: Mediastinal blood pool activity: SUV max 2.4 NECK: Interval progression of hypermetabolic cervical lymphadenopathy. Index left level II node measured previously at 12 mm with SUV max = 3.5 measures similar a 12 mm today but demonstrates SUV max = 5. Right-sided level III lymph node measured previously at 10 mm short axis is now 12 mm. SUV max = 7.4 today compared to 2.4 previously. Similar stable to a minimal increase in size with diffusely increased hypermetabolism noted in the numerous bilateral cervical lymph nodes. Incidental CT findings: none CHEST: Interval progression of hypermetabolism in the bilateral axillary lymphadenopathy. The index low left axillary node measured previously at 1.3 cm short axis with SUV max = 3 now measures 1.5 cm short axis  with SUV max = 4.6. Other left axillary lymph nodes demonstrate SUV max up to 7.4. Index lymph node in the right axilla measured previously at 1.1 cm now measures 1.2 cm. SUV max = 8.1 today compared to 2.8 previously. Hypermetabolic lymph nodes  are seen in the mediastinum in each hilum. 11 mm short axis subcarinal lymph node has SUV max = 4.7. Incidental CT findings: Coronary artery calcification is evident. Atherosclerotic calcification is noted in the wall of the thoracic aorta. ABDOMEN/PELVIS: No abnormal radiotracer uptake identified within the liver, pancreas, or adrenal glands. SUV max = for the spleen days 3.5 compared to 3.2 previously. Bulky central abdominal mesenteric lymphadenopathy is again noted. Nodal conglomeration measured previously at 16.5 x 9.3 cm now measures 18.2 x 11.6 cm. SUV max = 8.7 today in this nodal conglomeration compared to 3.9 previously. Similar progression of pelvic sidewall lymphadenopathy bilaterally. Right pelvic sidewall lymph node measured previously at 2.1 cm is now 3.0 cm. SUV max = 9.4 today compared to 3.4 previously. Incidental CT findings: There is abdominal aortic atherosclerosis without aneurysm. Layering sludge or tiny stones noted in the gallbladder. SKELETON: Mottled marrow uptake identified without definite focal hypermetabolism to suggest metastatic involvement. Incidental CT findings: none IMPRESSION: The extensive lymphadenopathy seen previously in the neck, chest, abdomen, and pelvis shows slight interval increase in size by CT imaging in the neck and chest but more prominent enlargement in the abdomen. This lymphadenopathy shows prominent increase in hypermetabolism with uptake levels now compatible with Deauville 5 criteria. Aortic Atherosclerois (ICD10-170.0) Electronically Signed   By: Misty Stanley M.D.   On: 10/29/2018 15:55   Ir Imaging Guided Port Insertion  Result Date: 11/10/2018 INDICATION: 68 year old female with chronic lymphocytic leukemia in  need of durable venous access for chemotherapy. EXAM: IMPLANTED PORT A CATH PLACEMENT WITH ULTRASOUND AND FLUOROSCOPIC GUIDANCE MEDICATIONS: 900 mg clindamycin; The antibiotic was administered within an appropriate time interval prior to skin puncture. ANESTHESIA/SEDATION: Versed 2 mg IV; Fentanyl 100 mcg IV; Moderate Sedation Time:  21 minutes The patient was continuously monitored during the procedure by the interventional radiology nurse under my direct supervision. FLUOROSCOPY TIME:  0 minutes, 18 seconds (3 mGy) COMPLICATIONS: None immediate. PROCEDURE: The right neck and chest was prepped with chlorhexidine, and draped in the usual sterile fashion using maximum barrier technique (cap and mask, sterile gown, sterile gloves, large sterile sheet, hand hygiene and cutaneous antiseptic). Local anesthesia was attained by infiltration with 1% lidocaine with epinephrine. Ultrasound demonstrated patency of the right internal jugular vein, and this was documented with an image. Under real-time ultrasound guidance, this vein was accessed with a 21 gauge micropuncture needle and image documentation was performed. A small dermatotomy was made at the access site with an 11 scalpel. A 0.018" wire was advanced into the SVC and the access needle exchanged for a 81F micropuncture vascular sheath. The 0.018" wire was then removed and a 0.035" wire advanced into the IVC. An appropriate location for the subcutaneous reservoir was selected below the clavicle and an incision was made through the skin and underlying soft tissues. The subcutaneous tissues were then dissected using a combination of blunt and sharp surgical technique and a pocket was formed. A single lumen power injectable portacatheter was then tunneled through the subcutaneous tissues from the pocket to the dermatotomy and the port reservoir placed within the subcutaneous pocket. The venous access site was then serially dilated and a peel away vascular sheath placed  over the wire. The wire was removed and the port catheter advanced into position under fluoroscopic guidance. The catheter tip is positioned in the superior cavoatrial junction. This was documented with a spot image. The portacatheter was then tested and found to flush and aspirate well. The port was flushed with saline  followed by 100 units/mL heparinized saline. The pocket was then closed in two layers using first subdermal inverted interrupted absorbable sutures followed by a running subcuticular suture. The epidermis was then sealed with Dermabond. The dermatotomy at the venous access site was also closed with Dermabond. IMPRESSION: Successful placement of a right IJ approach Power Port with ultrasound and fluoroscopic guidance. The catheter is ready for use. Electronically Signed   By: Jacqulynn Cadet M.D.   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  She now "has a neck". Her left axillary and supraclavicular adenopathy is smaller, better demarcated, and softer.  However she is severely neutropenic.  She is day 6 from her Neulasta dose.  Her counts should be recovering shortly but in any case she is going to start ciprofloxacin today and take it every 12 hours for the next 5 days.  She knows to call for any temperature  She is also severely thrombocytopenic.  There has been no bleeding or bruising.  We are going to repeat the platelet count on Friday and transfuse as needed  What concerns me the most about her of course is her severe hypogammaglobulinemia.  She will receive IVIG 11/28/2018 and we will repeat that on a monthly basis as needed until her disease is under better control.  We may repeat the same chemo at lower doses or simply move on to obinutuzumab which was quite effective when given alone.  More likely we will do a 50% CHOP and then continue obinutuzumab maintenance.  She knows to call for any other issues that may develop before the next visit.   Sulaiman Imbert, [name] Dad, MD  11/26/18 4:11 PM Medical Oncology and Hematology [facility] [address] [place], [place] [address] Tel. [phone]    Fax. [phone]  I, Jacqualyn Posey am acting as a Education administrator for Chauncey Cruel, MD.   I, Lurline Del MD, have reviewed the above documentation for accuracy and completeness, and I agree with the above.

## 2018-11-26 ENCOUNTER — Other Ambulatory Visit: Payer: Self-pay

## 2018-11-26 ENCOUNTER — Inpatient Hospital Stay: Payer: Medicare Other

## 2018-11-26 ENCOUNTER — Inpatient Hospital Stay: Payer: Medicare Other | Admitting: Oncology

## 2018-11-26 ENCOUNTER — Other Ambulatory Visit: Payer: Self-pay | Admitting: Oncology

## 2018-11-26 DIAGNOSIS — D801 Nonfamilial hypogammaglobulinemia: Secondary | ICD-10-CM | POA: Diagnosis not present

## 2018-11-26 DIAGNOSIS — G629 Polyneuropathy, unspecified: Secondary | ICD-10-CM

## 2018-11-26 DIAGNOSIS — D701 Agranulocytosis secondary to cancer chemotherapy: Secondary | ICD-10-CM

## 2018-11-26 DIAGNOSIS — D6959 Other secondary thrombocytopenia: Secondary | ICD-10-CM

## 2018-11-26 DIAGNOSIS — C9110 Chronic lymphocytic leukemia of B-cell type not having achieved remission: Secondary | ICD-10-CM

## 2018-11-26 DIAGNOSIS — D696 Thrombocytopenia, unspecified: Secondary | ICD-10-CM | POA: Diagnosis not present

## 2018-11-26 DIAGNOSIS — D709 Neutropenia, unspecified: Secondary | ICD-10-CM | POA: Diagnosis not present

## 2018-11-26 DIAGNOSIS — R05 Cough: Secondary | ICD-10-CM

## 2018-11-26 DIAGNOSIS — Z5111 Encounter for antineoplastic chemotherapy: Secondary | ICD-10-CM | POA: Diagnosis not present

## 2018-11-26 DIAGNOSIS — T451X5A Adverse effect of antineoplastic and immunosuppressive drugs, initial encounter: Secondary | ICD-10-CM

## 2018-11-26 DIAGNOSIS — G620 Drug-induced polyneuropathy: Secondary | ICD-10-CM

## 2018-11-26 DIAGNOSIS — R630 Anorexia: Secondary | ICD-10-CM

## 2018-11-26 LAB — COMPREHENSIVE METABOLIC PANEL
ALK PHOS: 84 U/L (ref 38–126)
ALT: 12 U/L (ref 0–44)
AST: 8 U/L — ABNORMAL LOW (ref 15–41)
Albumin: 3.1 g/dL — ABNORMAL LOW (ref 3.5–5.0)
Anion gap: 10 (ref 5–15)
BILIRUBIN TOTAL: 1.5 mg/dL — AB (ref 0.3–1.2)
BUN: 23 mg/dL (ref 8–23)
CALCIUM: 8.4 mg/dL — AB (ref 8.9–10.3)
CO2: 24 mmol/L (ref 22–32)
CREATININE: 0.81 mg/dL (ref 0.44–1.00)
Chloride: 98 mmol/L (ref 98–111)
GFR calc Af Amer: >60 mL/min (ref >60)
GFR calc non Af Amer: >60 mL/min (ref >60)
Glucose, Bld: 264 mg/dL — ABNORMAL HIGH (ref 70–99)
Potassium: 4 mmol/L (ref 3.5–5.1)
Sodium: 132 mmol/L — ABNORMAL LOW (ref 135–145)
Total Protein: 5.3 g/dL — ABNORMAL LOW (ref 6.5–8.1)

## 2018-11-26 LAB — CBC WITH DIFFERENTIAL/PLATELET
Abs Immature Granulocytes: 0 10*3/uL (ref 0.00–0.07)
Basophils Absolute: 0 10*3/uL (ref 0.0–0.1)
Basophils Relative: 0 %
EOS ABS: 0 10*3/uL (ref 0.0–0.5)
Eosinophils Relative: 3 %
HCT: 33.7 % — ABNORMAL LOW (ref 36.0–46.0)
Hemoglobin: 11.1 g/dL — ABNORMAL LOW (ref 12.0–15.0)
Immature Granulocytes: 0 %
Lymphocytes Relative: 85 %
Lymphs Abs: 0.3 10*3/uL — ABNORMAL LOW (ref 0.7–4.0)
MCH: 28.5 pg (ref 26.0–34.0)
MCHC: 32.9 g/dL (ref 30.0–36.0)
MCV: 86.4 fL (ref 80.0–100.0)
Monocytes Absolute: 0 10*3/uL — ABNORMAL LOW (ref 0.1–1.0)
Monocytes Relative: 9 %
Neutro Abs: 0 10*3/uL — CL (ref 1.7–7.7)
Neutrophils Relative %: 3 %
Platelets: 12 10*3/uL — ABNORMAL LOW (ref 150–400)
RBC: 3.9 MIL/uL (ref 3.87–5.11)
RDW: 14.9 % (ref 11.5–15.5)
WBC: 0.3 10*3/uL — CL (ref 4.0–10.5)
nRBC: 0 % (ref 0.0–0.2)

## 2018-11-26 LAB — LACTATE DEHYDROGENASE: LDH: 251 U/L — ABNORMAL HIGH (ref 98–192)

## 2018-11-26 LAB — URIC ACID: Uric Acid, Serum: 3.1 mg/dL (ref 2.5–7.1)

## 2018-11-26 LAB — PHOSPHORUS: Phosphorus: 2.2 mg/dL — ABNORMAL LOW (ref 2.5–4.6)

## 2018-11-26 MED ORDER — CIPROFLOXACIN HCL 500 MG PO TABS: 500.0000 mg | ORAL_TABLET | Freq: Two times a day (BID) | ORAL | 1 refills | Status: DC

## 2018-11-27 LAB — IGG, IGA, IGM
IgA: 28 mg/dL — ABNORMAL LOW (ref 87–352)
IgG (Immunoglobin G), Serum: 221 mg/dL — ABNORMAL LOW (ref 700–1600)

## 2018-11-28 ENCOUNTER — Telehealth: Payer: Self-pay

## 2018-11-28 ENCOUNTER — Inpatient Hospital Stay: Payer: Medicare Other

## 2018-11-28 ENCOUNTER — Telehealth: Payer: Self-pay | Admitting: Oncology

## 2018-11-28 NOTE — Telephone Encounter (Signed)
Confirmed 3/27 appointment with patient. Rescheduled from 3/20 per 3/20 schedule message. First available 3/27 - date/time ok per infusion AD. Infusion AD spoke with desk nurse.

## 2018-11-28 NOTE — Telephone Encounter (Signed)
Patient did not show for IVIG today.  Nurse spoke with patient, patient stated, "I just did not feel well enough to come."  Patient denies any fever, shortness of breath, or chest pain.  Per pt just fatigue.  Nurse reviewed with MD - MD would like for patient to come next week to receive IVIG.  Left message with patient to update on getting her scheduled for next week.   Scheduling message sent.

## 2018-12-01 ENCOUNTER — Other Ambulatory Visit: Payer: Self-pay | Admitting: Oncology

## 2018-12-01 ENCOUNTER — Inpatient Hospital Stay: Payer: Medicare Other | Admitting: Oncology

## 2018-12-01 ENCOUNTER — Inpatient Hospital Stay (HOSPITAL_COMMUNITY)
Admission: AD | Admit: 2018-12-01 | Discharge: 2018-12-03 | DRG: 641 | Disposition: A | Discharge status: Home Health | Payer: Medicare Other | Source: Ambulatory Visit | Attending: Internal Medicine | Admitting: Internal Medicine

## 2018-12-01 ENCOUNTER — Other Ambulatory Visit: Payer: Self-pay

## 2018-12-01 ENCOUNTER — Encounter (HOSPITAL_COMMUNITY): Payer: Self-pay | Admitting: Family Medicine

## 2018-12-01 ENCOUNTER — Inpatient Hospital Stay: Payer: Medicare Other

## 2018-12-01 VITALS — BP 107/45 | HR 100 | Temp 98.5°F | Resp 18 | Ht 62.25 in | Wt 182.0 lb

## 2018-12-01 DIAGNOSIS — D6959 Other secondary thrombocytopenia: Secondary | ICD-10-CM

## 2018-12-01 DIAGNOSIS — Z881 Allergy status to other antibiotic agents status: Secondary | ICD-10-CM

## 2018-12-01 DIAGNOSIS — T451X5A Adverse effect of antineoplastic and immunosuppressive drugs, initial encounter: Secondary | ICD-10-CM

## 2018-12-01 DIAGNOSIS — G620 Drug-induced polyneuropathy: Secondary | ICD-10-CM | POA: Diagnosis present

## 2018-12-01 DIAGNOSIS — Z803 Family history of malignant neoplasm of breast: Secondary | ICD-10-CM

## 2018-12-01 DIAGNOSIS — D701 Agranulocytosis secondary to cancer chemotherapy: Secondary | ICD-10-CM | POA: Diagnosis present

## 2018-12-01 DIAGNOSIS — Z96651 Presence of right artificial knee joint: Secondary | ICD-10-CM | POA: Diagnosis present

## 2018-12-01 DIAGNOSIS — I10 Essential (primary) hypertension: Secondary | ICD-10-CM | POA: Diagnosis present

## 2018-12-01 DIAGNOSIS — Z888 Allergy status to other drugs, medicaments and biological substances status: Secondary | ICD-10-CM

## 2018-12-01 DIAGNOSIS — Z79890 Hormone replacement therapy (postmenopausal): Secondary | ICD-10-CM

## 2018-12-01 DIAGNOSIS — Z800 Family history of malignant neoplasm of digestive organs: Secondary | ICD-10-CM

## 2018-12-01 DIAGNOSIS — I959 Hypotension, unspecified: Secondary | ICD-10-CM | POA: Diagnosis present

## 2018-12-01 DIAGNOSIS — N179 Acute kidney failure, unspecified: Secondary | ICD-10-CM | POA: Diagnosis present

## 2018-12-01 DIAGNOSIS — E860 Dehydration: Secondary | ICD-10-CM | POA: Diagnosis not present

## 2018-12-01 DIAGNOSIS — C9110 Chronic lymphocytic leukemia of B-cell type not having achieved remission: Secondary | ICD-10-CM

## 2018-12-01 DIAGNOSIS — E739 Lactose intolerance, unspecified: Secondary | ICD-10-CM | POA: Diagnosis present

## 2018-12-01 DIAGNOSIS — E871 Hypo-osmolality and hyponatremia: Secondary | ICD-10-CM | POA: Diagnosis present

## 2018-12-01 DIAGNOSIS — D801 Nonfamilial hypogammaglobulinemia: Secondary | ICD-10-CM | POA: Diagnosis present

## 2018-12-01 DIAGNOSIS — E876 Hypokalemia: Secondary | ICD-10-CM | POA: Diagnosis present

## 2018-12-01 DIAGNOSIS — E039 Hypothyroidism, unspecified: Secondary | ICD-10-CM | POA: Diagnosis present

## 2018-12-01 DIAGNOSIS — E785 Hyperlipidemia, unspecified: Secondary | ICD-10-CM | POA: Diagnosis present

## 2018-12-01 DIAGNOSIS — E875 Hyperkalemia: Secondary | ICD-10-CM | POA: Diagnosis present

## 2018-12-01 DIAGNOSIS — Z91041 Radiographic dye allergy status: Secondary | ICD-10-CM

## 2018-12-01 DIAGNOSIS — D696 Thrombocytopenia, unspecified: Secondary | ICD-10-CM | POA: Insufficient documentation

## 2018-12-01 DIAGNOSIS — D638 Anemia in other chronic diseases classified elsewhere: Secondary | ICD-10-CM | POA: Diagnosis present

## 2018-12-01 DIAGNOSIS — K645 Perianal venous thrombosis: Secondary | ICD-10-CM | POA: Diagnosis present

## 2018-12-01 DIAGNOSIS — Z8249 Family history of ischemic heart disease and other diseases of the circulatory system: Secondary | ICD-10-CM

## 2018-12-01 LAB — CBC WITH DIFFERENTIAL/PLATELET
Abs Immature Granulocytes: 0.08 10*3/uL — ABNORMAL HIGH (ref 0.00–0.07)
Basophils Absolute: 0 10*3/uL (ref 0.0–0.1)
Basophils Relative: 1 %
Eosinophils Absolute: 0 10*3/uL (ref 0.0–0.5)
Eosinophils Relative: 0 %
HCT: 26.1 % — ABNORMAL LOW (ref 36.0–46.0)
Hemoglobin: 8.7 g/dL — ABNORMAL LOW (ref 12.0–15.0)
IMMATURE GRANULOCYTES: 8 %
Lymphocytes Relative: 13 %
Lymphs Abs: 0.1 10*3/uL — ABNORMAL LOW (ref 0.7–4.0)
MCH: 28.5 pg (ref 26.0–34.0)
MCHC: 33.3 g/dL (ref 30.0–36.0)
MCV: 85.6 fL (ref 80.0–100.0)
Monocytes Absolute: 0.1 10*3/uL (ref 0.1–1.0)
Monocytes Relative: 5 %
NEUTROS PCT: 73 %
Neutro Abs: 0.7 10*3/uL — ABNORMAL LOW (ref 1.7–7.7)
Platelets: 55 10*3/uL — ABNORMAL LOW (ref 150–400)
RBC: 3.05 MIL/uL — AB (ref 3.87–5.11)
RDW: 14.5 % (ref 11.5–15.5)
WBC Morphology: INCREASED
WBC: 1 10*3/uL — ABNORMAL LOW (ref 4.0–10.5)
nRBC: 0 % (ref 0.0–0.2)

## 2018-12-01 LAB — COMPREHENSIVE METABOLIC PANEL
ALBUMIN: 2.3 g/dL — AB (ref 3.5–5.0)
ALT: 10 U/L (ref 0–44)
AST: 17 U/L (ref 15–41)
Alkaline Phosphatase: 112 U/L (ref 38–126)
Anion gap: 11 (ref 5–15)
BILIRUBIN TOTAL: 2.2 mg/dL — AB (ref 0.3–1.2)
BUN: 31 mg/dL — AB (ref 8–23)
CO2: 22 mmol/L (ref 22–32)
Calcium: 7.7 mg/dL — ABNORMAL LOW (ref 8.9–10.3)
Chloride: 96 mmol/L — ABNORMAL LOW (ref 98–111)
Creatinine, Ser: 1.11 mg/dL — ABNORMAL HIGH (ref 0.44–1.00)
GFR calc Af Amer: 60 mL/min — ABNORMAL LOW (ref >60)
GFR calc non Af Amer: 51 mL/min — ABNORMAL LOW (ref >60)
GLUCOSE: 179 mg/dL — AB (ref 70–99)
Potassium: 3.2 mmol/L — ABNORMAL LOW (ref 3.5–5.1)
Sodium: 129 mmol/L — ABNORMAL LOW (ref 135–145)
Total Protein: 5.3 g/dL — ABNORMAL LOW (ref 6.5–8.1)

## 2018-12-01 LAB — LACTATE DEHYDROGENASE: LDH: 188 U/L (ref 98–192)

## 2018-12-01 LAB — PHOSPHORUS: Phosphorus: 1.9 mg/dL — ABNORMAL LOW (ref 2.5–4.6)

## 2018-12-01 LAB — URIC ACID: Uric Acid, Serum: 5.3 mg/dL (ref 2.5–7.1)

## 2018-12-01 MED ORDER — HYDROCORTISONE 2.5 % RE CREA
TOPICAL_CREAM | Freq: Three times a day (TID) | RECTAL | Status: DC
  Administered 2018-12-01 – 2018-12-03 (×6): via RECTAL
  Filled 2018-12-01: qty 28.35

## 2018-12-01 MED ORDER — SODIUM CHLORIDE 0.9 % IV BOLUS
1000.0000 mL | Freq: Once | INTRAVENOUS | Status: AC
  Administered 2018-12-01: 1000 mL via INTRAVENOUS

## 2018-12-01 MED ORDER — SODIUM CHLORIDE 0.9% FLUSH
3.0000 mL | Freq: Two times a day (BID) | INTRAVENOUS | Status: DC
  Administered 2018-12-02: 3 mL via INTRAVENOUS

## 2018-12-01 MED ORDER — LEVOTHYROXINE SODIUM 88 MCG PO TABS
88.0000 ug | ORAL_TABLET | Freq: Every day | ORAL | Status: DC
  Administered 2018-12-02 – 2018-12-03 (×2): 88 ug via ORAL
  Filled 2018-12-01 (×2): qty 1

## 2018-12-01 MED ORDER — IMMUNE GLOBULIN (HUMAN) 10 GM/100ML IV SOLN
1.0000 g/kg | INTRAVENOUS | Status: DC
  Administered 2018-12-02: 85 g via INTRAVENOUS
  Filled 2018-12-01: qty 800

## 2018-12-01 MED ORDER — ALLOPURINOL 300 MG PO TABS
300.0000 mg | ORAL_TABLET | Freq: Every day | ORAL | Status: DC
  Administered 2018-12-02 – 2018-12-03 (×2): 300 mg via ORAL
  Filled 2018-12-01 (×2): qty 1

## 2018-12-01 MED ORDER — ACETAMINOPHEN 650 MG RE SUPP: 650.0000 mg | Freq: Four times a day (QID) | RECTAL | Status: DC | PRN

## 2018-12-01 MED ORDER — ONDANSETRON HCL 4 MG PO TABS: 4.0000 mg | ORAL_TABLET | Freq: Four times a day (QID) | ORAL | Status: DC | PRN

## 2018-12-01 MED ORDER — KCL IN DEXTROSE-NACL 20-5-0.9 MEQ/L-%-% IV SOLN
INTRAVENOUS | Status: DC
  Administered 2018-12-01 – 2018-12-02 (×3): via INTRAVENOUS
  Filled 2018-12-01 (×4): qty 1000

## 2018-12-01 MED ORDER — LORAZEPAM 0.5 MG PO TABS: 0.5000 mg | ORAL_TABLET | Freq: Every evening | ORAL | Status: DC | PRN

## 2018-12-01 MED ORDER — ACETAMINOPHEN 325 MG PO TABS
650.0000 mg | ORAL_TABLET | Freq: Four times a day (QID) | ORAL | Status: DC | PRN
  Administered 2018-12-02: 650 mg via ORAL
  Filled 2018-12-01: qty 2

## 2018-12-01 MED ORDER — OXYCODONE-ACETAMINOPHEN 5-325 MG PO TABS
1.0000 | ORAL_TABLET | Freq: Four times a day (QID) | ORAL | Status: DC | PRN
  Administered 2018-12-01: 1 via ORAL
  Administered 2018-12-02 – 2018-12-03 (×3): 2 via ORAL
  Filled 2018-12-01: qty 2
  Filled 2018-12-01: qty 1
  Filled 2018-12-01 (×2): qty 2

## 2018-12-01 MED ORDER — ONDANSETRON HCL 4 MG/2ML IJ SOLN: 4.0000 mg | Freq: Four times a day (QID) | INTRAMUSCULAR | Status: DC | PRN

## 2018-12-01 MED ORDER — ACYCLOVIR 200 MG PO CAPS
200.0000 mg | ORAL_CAPSULE | Freq: Every day | ORAL | Status: DC
  Administered 2018-12-01 – 2018-12-03 (×9): 200 mg via ORAL
  Filled 2018-12-01 (×10): qty 1

## 2018-12-01 NOTE — Progress Notes (Signed)
The patient was admitted from the office.  Please see the inpatient progress note

## 2018-12-01 NOTE — Progress Notes (Signed)
Maria [name]   DOB:10-24-1950   JK#:[id number]   YKD#:[id number]  Subjective:  Maria [name] was supposed to have received platelets and IVIG last week, but she did not show.  She said she did not feel well.  Things have not improved since, and she called over the weekend to report that she fell on Saturday, was unable to get up from the floor all night, and finally was able to get a hold of a neighbor the next day.  Her niece has been staying with her since and helping out, but she is appropriately concerned because the patient is very weak, confused, had stool incontinence last night, and of course remains severely immunocompromised.  With some difficulty I convinced Maria [name] to come see me today.  She remains confused and has a low blood pressure.  Luckily there is no fever.  She is neutropenic and thrombocytopenic.  She has multiple electrolyte abnormalities.  She likely has agreed to admission to get these problems corrected.  Objective: Middle-aged white woman examined in a wheelchair Vitals:   12/01/18 1500  BP: (!) 98/51  Pulse: 94  Resp: 18  Temp: 99.8 F (37.7 C)  SpO2: 99%    There is no height or weight on file to calculate BMI. No intake or output data in the 24 hours ending 12/01/18 1628   Sclerae unicteric  Her massive palpable cervical/supraclavicular adenopathy, left greater than right, is significantly improved  Lungs no rales or wheezes  Heart regular rate and rhythm  Abdomen soft, +BS  Neuro knows place and person but barely holds onto a thread of discourse and perseverates  Breast exam: Deferred  CBG (last 3)  No results for input(s): GLUCAP in the last 72 hours.   Labs:  Lab Results  Component Value Date   WBC 1.0 (L) 12/01/2018   HGB 8.7 (L) 12/01/2018   HCT 26.1 (L) 12/01/2018   MCV 85.6 12/01/2018   PLT 55 (L) 12/01/2018   NEUTROABS 0.7 (L) 12/01/2018    @LASTCHEMISTRY @  Urine Studies No results for input(s): UHGB, CRYS in the last 72  hours.  Invalid input(s): UACOL, UAPR, USPG, UPH, UTP, UGL, UKET, UBIL, UNIT, UROB, ULEU, UEPI, UWBC, URBC, UBAC, CAST, UCOM, BILUA  Basic Metabolic Panel: Recent Labs  Lab 11/26/18 1526 12/01/18 1244  NA 132* 129*  K 4.0 3.2*  CL 98 96*  CO2 24 22  GLUCOSE 264* 179*  BUN 23 31*  CREATININE 0.81 1.11*  CALCIUM 8.4* 7.7*  PHOS 2.2*  --    GFR Estimated Creatinine Clearance: 49.3 mL/min (A) (by C-G formula based on SCr of 1.11 mg/dL (H)). Liver Function Tests: Recent Labs  Lab 11/26/18 1526 12/01/18 1244  AST 8* 17  ALT 12 10  ALKPHOS 84 112  BILITOT 1.5* 2.2*  PROT 5.3* 5.3*  ALBUMIN 3.1* 2.3*   No results for input(s): LIPASE, AMYLASE in the last 168 hours. No results for input(s): AMMONIA in the last 168 hours. Coagulation profile No results for input(s): INR, PROTIME in the last 168 hours.  CBC: Recent Labs  Lab 11/26/18 1526 12/01/18 1244  WBC 0.3* 1.0*  NEUTROABS 0.0* 0.7*  HGB 11.1* 8.7*  HCT 33.7* 26.1*  MCV 86.4 85.6  PLT 12* 55*   Cardiac Enzymes: No results for input(s): CKTOTAL, CKMB, CKMBINDEX, TROPONINI in the last 168 hours. BNP: Invalid input(s): POCBNP CBG: No results for input(s): GLUCAP in the last 168 hours. D-Dimer No results for input(s): DDIMER in the last 72 hours. Hgb A1c  No results for input(s): HGBA1C in the last 72 hours. Lipid Profile No results for input(s): CHOL, HDL, LDLCALC, TRIG, CHOLHDL, LDLDIRECT in the last 72 hours. Thyroid function studies No results for input(s): TSH, T4TOTAL, T3FREE, THYROIDAB in the last 72 hours.  Invalid input(s): FREET3 Anemia work up No results for input(s): VITAMINB12, FOLATE, FERRITIN, TIBC, IRON, RETICCTPCT in the last 72 hours. Microbiology No results found for this or any previous visit (from the past 240 hour(s)).    Studies:  No results found.  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 She was severely thrombocytopenic from treatment, but her platelets are now >50K without transfusion. She was also severely neutropenic. Today is day 11 from pegfilgastrim and he Davis is just beginning to rebound (currently at 0.7).  She also has a very low IgG level.  She had been scheduled for IVIG last week but she was not able to make it.  I am writing for her to receive IVIG tomorrow as a result.  I am hopeful with some fluids, correction of her electrolytes, and with the IVIG on board (to be given tomorrow), and hopefully with the [place] a little bit better, she will be able to be discharged home Wednesday morning  12/03/2018  I greatly appreciate the hospitalist service's help with this patient.  We will follow with you   Chauncey Cruel, MD 12/01/2018  4:28 PM Medical Oncology and Hematology [facility] [address] [place], [place] [address] Tel. [phone]    Fax. [phone]

## 2018-12-01 NOTE — H&P (Addendum)
History and Physical  BRIELE LAGASSE VXB:[id number] DOB: 1951-01-22 DOA: 12/01/2018  Referring MD/NP/PA: Dr. Jana Hakim PCP: Marton Redwood, MD Outpatient Specialists: Dr. Jana Hakim  Patient coming from: Cancer center  Chief Complaint: Dehydration, neutropenia  HPI: Maria [name] is a 68 y.o. 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 She appeared hypotensive, weak, somewhat confused with labs demonstrating AKI, hypokalemia, worsened hyponatremia continued neutropenia, decreasing hemoglobin, and improving thrombocytopenia. Admission was requested for IVIG and IVF's with electrolyte supplementation.   Review of Systems: Poor appetite, decrease per oral intake, unintentional weight loss for past month (down from 198lbs > 182lbs), +painful rectal bleeding with stools. Denies fever, chills, changes in vision or hearing, headache, cough, sore throat, chest pain, palpitations, shortness of breath, abdominal pain, nausea, vomiting, change in bladder habits, myalgias, arthralgias, rash, and per HPI. All others reviewed and are negative.   Past Medical History:  Diagnosis Date  . Allergy   . Arthritis   . Asthma   . Cancer [facility])    chronic lymphacytic leukemia  . CLL (chronic lymphocytic leukemia) ([place])   . Diabetes mellitus    PMH  . Headache    migraine  . Hyperlipidemia   . Hypertension   . Hypothyroidism   . Lymphoma ([place])   . Thyroid disease    hypothyroidism  . Wears glasses    Past Surgical History:  Procedure Laterality Date  . ABDOMINAL HYSTERECTOMY    . AXILLARY LYMPH NODE BIOPSY Right 08/21/2018   Procedure: RIGHT AXILLARY LYMPH NODE BIOPSY ERAS PATHWAY;  Surgeon: Maria Skates, MD;  Location: [facility];  Service: General;   Laterality: Right;  LMA  . BREAST EXCISIONAL BIOPSY Right   . COLONOSCOPY    . IR IMAGING GUIDED PORT INSERTION  11/10/2018  . LYMPH NODE DISSECTION    . STRABISMUS SURGERY    . TONSILLECTOMY    . TOTAL KNEE ARTHROPLASTY Right 12/03/2016   Procedure: RIGHT TOTAL KNEE ARTHROPLASTY;  Surgeon: Maria Arabian, MD;  Location: [facility];  Service: Orthopedics;  Laterality: Right;   - Nonsmoker. No recent EtOH.   reports that she has never smoked. She has never used smokeless tobacco. She reports current alcohol use. She reports that she does not use drugs. Allergies  Allergen Reactions  . Iohexol Swelling and Rash  . Compazine [Prochlorperazine Edisylate] Other (See Comments)    Mild confusion  . Contrast Media  [Iodinated Diagnostic Agents]   . Lactose Intolerance (Gi)     Diarrhea, gas bloating  . Lactulose Diarrhea and Other (See Comments)    gas bloating  . Cephalexin Rash   Family History  Problem Relation Age of Onset  . Liver cancer Mother   . Heart disease Father   . Breast cancer Sister 73  . Colon cancer Neg Hx   . Pancreatic cancer Neg Hx   . Rectal cancer Neg Hx   . Stomach cancer Neg Hx    - Family history otherwise reviewed and not pertinent.  Prior to Admission medications   Medication Sig Start Date End Date Taking? Authorizing Provider  acyclovir (ZOVIRAX) 200 MG capsule Take 200 mg by mouth 5 (five) times daily.    [provider]  allopurinol (ZYLOPRIM) 300 MG tablet Take 1 tablet (300 mg total) by mouth daily.  11/10/18   [name], Maria Dad, MD  ciprofloxacin (CIPRO) 500 MG tablet Take 1 tablet (500 mg total) by mouth 2 (two) times daily. 11/26/18   [name], Maria Dad, MD  hydrochlorothiazide (HYDRODIURIL) 12.5 MG tablet Take 12.5 mg by mouth daily.     [provider]  lidocaine-prilocaine (EMLA) cream Apply to affected area once 11/03/18   [name], Maria Dad, MD  LORazepam (ATIVAN) 0.5 MG tablet Take 1 tablet (0.5 mg total) by mouth at bedtime as needed  (Nausea or vomiting). 11/03/18   [name], Maria Dad, MD  ondansetron (ZOFRAN) 8 MG tablet Take 1 tablet (8 mg total) by mouth every 8 (eight) hours as needed for nausea or vomiting. 10/23/18   [name], Maria Dad, MD  oxyCODONE-acetaminophen (PERCOCET/ROXICET) 5-325 MG tablet Take 1-2 tablets by mouth every 6 (six) hours as needed for severe pain. 11/03/18   [name], Maria Dad, MD  predniSONE (DELTASONE) 20 MG tablet Take 3 tablets (60 mg total) by mouth daily with breakfast. Take days 1-5 starting on CHOP chemo day 11/19/18   Maria Phlegm, NP  rosuvastatin (CRESTOR) 10 MG tablet Take 10 mg by mouth at bedtime. 07/13/18   [provider]  SYNTHROID 88 MCG tablet Take 88 mcg by mouth daily before breakfast.  02/22/11   [provider]  zoledronic acid (RECLAST) 5 MG/100ML SOLN injection Inject 5 mg into the vein once. Once a year    [provider]    Physical Exam: Vitals:   12/01/18 1500  BP: (!) 98/51  Pulse: 94  Resp: 18  Temp: 99.8 F (37.7 C)  TempSrc: Oral  SpO2: 99%   Constitutional: 68 y.o. female in no distress, calm demeanor Eyes: Lids and conjunctivae normal, PERRL ENMT: Mucous membranes are moist. Posterior pharynx clear of any exudate or lesions. Fair dentition.  Neck: normal, supple, no masses, no thyromegaly Respiratory: Non-labored breathing room air without accessory muscle use. Clear breath sounds to auscultation bilaterally Cardiovascular: Regular rate and rhythm, no murmurs, rubs, or gallops. No carotid bruits. No JVD. Trace LE edema. Palpable pedal pulses. Abdomen: Normoactive bowel sounds. No tenderness, non-distended, and no masses palpated. No hepatosplenomegaly. Rectal: External hemorrhoids without bleeding, no fissure or abscess. GU: No indwelling catheter Musculoskeletal: No clubbing / cyanosis. No joint deformity upper and lower extremities. Good ROM, no contractures. Normal muscle tone.  Skin: Warm, dry, pale. Sacral bruising.  No significant lesions noted.  Neurologic: CN II-XII grossly intact. Speech normal. No focal deficits in motor strength or sensation in all extremities.  Psychiatric: Alert, technically oriented but circuitous speech, poor historian. Train of thought lost quickly.   Labs on Admission: I have personally reviewed following labs and imaging studies  CBC: Recent Labs  Lab 11/26/18 1526 12/01/18 1244  WBC 0.3* 1.0*  NEUTROABS 0.0* 0.7*  HGB 11.1* 8.7*  HCT 33.7* 26.1*  MCV 86.4 85.6  PLT 12* 55*   Basic Metabolic Panel: Recent Labs  Lab 11/26/18 1526 12/01/18 1244  NA 132* 129*  K 4.0 3.2*  CL 98 96*  CO2 24 22  GLUCOSE 264* 179*  BUN 23 31*  CREATININE 0.81 1.11*  CALCIUM 8.4* 7.7*  PHOS 2.2* 1.9*   GFR: Estimated Creatinine Clearance: 49.3 mL/min (A) (by C-G formula based on SCr of 1.11 mg/dL (H)). Liver Function Tests: Recent Labs  Lab 11/26/18 1526 12/01/18 1244  AST 8* 17  ALT 12 10  ALKPHOS 84 112  BILITOT 1.5* 2.2*  PROT 5.3* 5.3*  ALBUMIN 3.1* 2.3*  Assessment/Plan Principal Problem:   Dehydration Active Problems:   Essential hypertension   CLL (chronic lymphocytic leukemia) ([facility])   Hypogammaglobulinemia (HCC)   Chemotherapy induced neutropenia (H[facility])   Neuropathy due to chemotherapeutic drug (H[facility])   Dehydration, AKI, hypotension:  - IVF: Give NS bolus followed by NS w/9mEq KCl. Recheck labs in AM  CLL: Dx 2003, treated w/cyclophosphamide, vincristine, rituxan, prednisone in 2005, treated again 2008, 2015. Seen progressive adenopathy Nov 2019 w/LN biopsy showing more aggressive disease without transformation to BCL. Rituximab and ibrutinib started, stopped w/o obvious response Jan 2020. Bendamustine/rituximab started Feb, stopped w/rapid progression.  - On CHOP/obinutuzumab as of 11/19/2018.   Neutropenia: Improving s/p pegfilgastrim.  - Neutropenic precautions.  - Monitor CBC w/diff   Thrombocytopenia: Improving.  - SCDs for DVT ppx.   Hypogammaglobulinemia:  - IVIG per oncology  Loose stools: Not purely watery stool.  - Monitor without testing.  Acute anemia: Hgb trending downward through this month (13.8 on 3/2, now down to 8.7). Suspect limited production, with painful rectal bleeding, wonder if that is also contributing.  - Monitor hgb and transfuse for hgb < 7 or per oncology.  - If continues downward trend may benefit from GI eval, though would be high risk for any procedures and would like to avoid them. If this is GI bleeding, hope for resolution once platelets improved. - FOBT  Thrombosed hemorrhoid:  - Topical anusol.  Hypothyroidism:  - Check TSH in AM - Continue synthroid.   Hypokalemia: Supplement in IVF, recheck in AM  Fall at home: Suspected to be due to weakness, denies LOC.  - PT/OT  DVT prophylaxis: SCDs  Code Status: Full  Family Communication: None at bedside Disposition Plan: Home once stable Consults called: Oncology is following  Admission status: Observation    Patrecia Pour, MD [facility] [email or web address] Password [facility] 12/01/2018, 4:54 PM

## 2018-12-02 ENCOUNTER — Other Ambulatory Visit: Payer: Self-pay | Admitting: Oncology

## 2018-12-02 ENCOUNTER — Other Ambulatory Visit: Payer: Self-pay | Admitting: *Deleted

## 2018-12-02 DIAGNOSIS — C9110 Chronic lymphocytic leukemia of B-cell type not having achieved remission: Secondary | ICD-10-CM | POA: Diagnosis present

## 2018-12-02 DIAGNOSIS — G620 Drug-induced polyneuropathy: Secondary | ICD-10-CM | POA: Diagnosis present

## 2018-12-02 DIAGNOSIS — T451X5A Adverse effect of antineoplastic and immunosuppressive drugs, initial encounter: Secondary | ICD-10-CM | POA: Diagnosis present

## 2018-12-02 DIAGNOSIS — D701 Agranulocytosis secondary to cancer chemotherapy: Secondary | ICD-10-CM | POA: Diagnosis present

## 2018-12-02 DIAGNOSIS — E860 Dehydration: Secondary | ICD-10-CM | POA: Diagnosis present

## 2018-12-02 DIAGNOSIS — I959 Hypotension, unspecified: Secondary | ICD-10-CM | POA: Diagnosis present

## 2018-12-02 DIAGNOSIS — E871 Hypo-osmolality and hyponatremia: Secondary | ICD-10-CM | POA: Diagnosis present

## 2018-12-02 DIAGNOSIS — D801 Nonfamilial hypogammaglobulinemia: Secondary | ICD-10-CM | POA: Diagnosis present

## 2018-12-02 DIAGNOSIS — Z800 Family history of malignant neoplasm of digestive organs: Secondary | ICD-10-CM | POA: Diagnosis not present

## 2018-12-02 DIAGNOSIS — Z79890 Hormone replacement therapy (postmenopausal): Secondary | ICD-10-CM | POA: Diagnosis not present

## 2018-12-02 DIAGNOSIS — Z888 Allergy status to other drugs, medicaments and biological substances status: Secondary | ICD-10-CM | POA: Diagnosis not present

## 2018-12-02 DIAGNOSIS — Z96651 Presence of right artificial knee joint: Secondary | ICD-10-CM | POA: Diagnosis present

## 2018-12-02 DIAGNOSIS — E739 Lactose intolerance, unspecified: Secondary | ICD-10-CM | POA: Diagnosis present

## 2018-12-02 DIAGNOSIS — I10 Essential (primary) hypertension: Secondary | ICD-10-CM | POA: Diagnosis present

## 2018-12-02 DIAGNOSIS — Z803 Family history of malignant neoplasm of breast: Secondary | ICD-10-CM | POA: Diagnosis not present

## 2018-12-02 DIAGNOSIS — E875 Hyperkalemia: Secondary | ICD-10-CM | POA: Diagnosis present

## 2018-12-02 DIAGNOSIS — Z881 Allergy status to other antibiotic agents status: Secondary | ICD-10-CM | POA: Diagnosis not present

## 2018-12-02 DIAGNOSIS — Z91041 Radiographic dye allergy status: Secondary | ICD-10-CM | POA: Diagnosis not present

## 2018-12-02 DIAGNOSIS — E039 Hypothyroidism, unspecified: Secondary | ICD-10-CM | POA: Diagnosis present

## 2018-12-02 DIAGNOSIS — K645 Perianal venous thrombosis: Secondary | ICD-10-CM | POA: Diagnosis present

## 2018-12-02 DIAGNOSIS — E876 Hypokalemia: Secondary | ICD-10-CM | POA: Diagnosis present

## 2018-12-02 DIAGNOSIS — Z8249 Family history of ischemic heart disease and other diseases of the circulatory system: Secondary | ICD-10-CM | POA: Diagnosis not present

## 2018-12-02 DIAGNOSIS — E785 Hyperlipidemia, unspecified: Secondary | ICD-10-CM | POA: Diagnosis present

## 2018-12-02 DIAGNOSIS — N179 Acute kidney failure, unspecified: Secondary | ICD-10-CM | POA: Diagnosis present

## 2018-12-02 LAB — BASIC METABOLIC PANEL
Anion gap: 8 (ref 5–15)
BUN: 34 mg/dL — ABNORMAL HIGH (ref 8–23)
CO2: 23 mmol/L (ref 22–32)
Calcium: 7.1 mg/dL — ABNORMAL LOW (ref 8.9–10.3)
Chloride: 100 mmol/L (ref 98–111)
Creatinine, Ser: 1.15 mg/dL — ABNORMAL HIGH (ref 0.44–1.00)
GFR calc Af Amer: 57 mL/min — ABNORMAL LOW (ref >60)
GFR calc non Af Amer: 49 mL/min — ABNORMAL LOW (ref >60)
GLUCOSE: 236 mg/dL — AB (ref 70–99)
POTASSIUM: 3.3 mmol/L — AB (ref 3.5–5.1)
Sodium: 131 mmol/L — ABNORMAL LOW (ref 135–145)

## 2018-12-02 LAB — OCCULT BLOOD X 1 CARD TO LAB, STOOL: Fecal Occult Bld: POSITIVE — AB

## 2018-12-02 LAB — IGG, IGA, IGM
IgA: 27 mg/dL — ABNORMAL LOW (ref 87–352)
IgG (Immunoglobin G), Serum: 186 mg/dL — ABNORMAL LOW (ref 700–1600)
IgM (Immunoglobulin M), Srm: <5 mg/dL — ABNORMAL LOW (ref 26–217)

## 2018-12-02 LAB — CBC WITH DIFFERENTIAL/PLATELET
Abs Immature Granulocytes: 0.18 10*3/uL — ABNORMAL HIGH (ref 0.00–0.07)
Basophils Absolute: 0 10*3/uL (ref 0.0–0.1)
Basophils Relative: 1 %
Eosinophils Absolute: 0 10*3/uL (ref 0.0–0.5)
Eosinophils Relative: 0 %
HEMATOCRIT: 26.6 % — AB (ref 36.0–46.0)
Hemoglobin: 8.5 g/dL — ABNORMAL LOW (ref 12.0–15.0)
Immature Granulocytes: 12 %
Lymphocytes Relative: 11 %
Lymphs Abs: 0.2 10*3/uL — ABNORMAL LOW (ref 0.7–4.0)
MCH: 28.2 pg (ref 26.0–34.0)
MCHC: 32 g/dL (ref 30.0–36.0)
MCV: 88.4 fL (ref 80.0–100.0)
MONO ABS: 0.1 10*3/uL (ref 0.1–1.0)
Monocytes Relative: 4 %
Neutro Abs: 1 10*3/uL — ABNORMAL LOW (ref 1.7–7.7)
Neutrophils Relative %: 72 %
Platelets: 74 10*3/uL — ABNORMAL LOW (ref 150–400)
RBC: 3.01 MIL/uL — ABNORMAL LOW (ref 3.87–5.11)
RDW: 14.8 % (ref 11.5–15.5)
WBC: 1.5 10*3/uL — ABNORMAL LOW (ref 4.0–10.5)
nRBC: 0 % (ref 0.0–0.2)

## 2018-12-02 LAB — HIV ANTIBODY (ROUTINE TESTING W REFLEX): HIV Screen 4th Generation wRfx: NONREACTIVE

## 2018-12-02 LAB — TSH: TSH: 1.809 u[IU]/mL (ref 0.350–4.500)

## 2018-12-02 MED ORDER — POTASSIUM & SODIUM PHOSPHATES 280-160-250 MG PO PACK
1.0000 | PACK | Freq: Three times a day (TID) | ORAL | Status: DC
  Administered 2018-12-02 – 2018-12-03 (×3): 1 via ORAL
  Filled 2018-12-02 (×5): qty 1

## 2018-12-02 MED ORDER — WITCH HAZEL-GLYCERIN EX PADS
MEDICATED_PAD | CUTANEOUS | Status: DC | PRN
  Administered 2018-12-03: 05:00:00 via TOPICAL
  Filled 2018-12-02: qty 100

## 2018-12-02 MED ORDER — POTASSIUM CHLORIDE CRYS ER 20 MEQ PO TBCR
40.0000 meq | EXTENDED_RELEASE_TABLET | Freq: Two times a day (BID) | ORAL | Status: DC
  Administered 2018-12-02 (×2): 40 meq via ORAL
  Filled 2018-12-02 (×2): qty 2

## 2018-12-02 MED ORDER — ALUM & MAG HYDROXIDE-SIMETH 200-200-20 MG/5ML PO SUSP
30.0000 mL | Freq: Once | ORAL | Status: AC
  Administered 2018-12-02: 30 mL via ORAL
  Filled 2018-12-02: qty 30

## 2018-12-02 NOTE — Care Management Obs Status (Signed)
MEDICARE OBSERVATION STATUS NOTIFICATION   Patient Details  Name: Maria [name] MRN: [id number] Date of Birth: May 20, 1951   Medicare Observation Status Notification Given:  Yes    Leeroy Cha, RN 12/02/2018, 1:47 PM

## 2018-12-02 NOTE — Progress Notes (Signed)
PROGRESS NOTE    Maria [name]  MLY:[id number] DOB: 09-08-1951 DOA: 12/01/2018 PCP: Marton Redwood, MD    Brief Narrative:  68 year old female with history of CLL and cycle 1 of CHOP/Obinutuzumab complicated by thrombocytopenia and neutropenia and hypogammaglobulinemia who was admitted from cancer center in the setting of poor oral intake, fall and weakness.  She was hypotensive, weak, had acute kidney injury and hypokalemia and pancytopenia.  She was admitted for IV fluids, IVIG and electrolyte supplementation.   Assessment & Plan:   Principal Problem:   Dehydration Active Problems:   Essential hypertension   CLL (chronic lymphocytic leukemia) (H[facility])   Hypogammaglobulinemia (HCC)   Chemotherapy induced neutropenia (H[facility])   Neuropathy due to chemotherapeutic drug (H[facility])  Moderate dehydration/AKI and hypotension: Some improvement today.  Blood pressure soft, however asymptomatic.  We will continue IV fluids with potassium today.  Holding home medications including diuretics.  Recheck levels tomorrow morning.  Neutropenia: Improving status post Pegfilgrastim.  Improving.  Thrombocytopenia: Platelet levels are adequate and improving.  Hypogammaglobulinemia: Receiving IVIG today.  She had low-grade fever with infusion, will monitor.  Acute anemia anemia of chronic disease: Hemoglobin is stable.  Hypokalemia: We will replace IV and oral potassium.  Recheck levels in the morning.  We will also check magnesium and phosphorus.  Hypothyroidism: On Synthroid.  This is 1.8.   DVT prophylaxis: SCDs. Code Status: Full code Family Communication: No family at bedside Disposition Plan: Home when stable.  Anticipate tomorrow.   Consultants:   Hematology oncology.  Procedures:   None.  Antimicrobials:   None.   Subjective: Patient was seen and examined.  She was able to walk to the bathroom.  With no dizziness.  Denies any particular complaints.  Had low-grade fever with  IVIG transfusion.  Denies any chills or Rigor.  Objective: Vitals:   12/02/18 1311 12/02/18 1313 12/02/18 1340 12/02/18 1400  BP: (!) 103/50 (!) 105/54 (!) 113/48 (!) 102/50  Pulse: 81 79 88 82  Resp:      Temp: 99 F (37.2 C) 98.8 F (37.1 C) 99 F (37.2 C)   TempSrc: Oral Oral    SpO2: 98% 98% 98% 98%  Weight:      Height:        Intake/Output Summary (Last 24 hours) at 12/02/2018 1420 Last data filed at 12/02/2018 1322 Gross per 24 hour  Intake 66.01 ml  Output -  Net 66.01 ml   Filed Weights   12/01/18 1500  Weight: 82.6 kg    Examination:  General exam: Appears calm and comfortable , on room air.  Comfortable.  Chronically sick looking. Respiratory system: Clear to auscultation. Respiratory effort normal. Cardiovascular system: S1 & S2 heard, RRR. No JVD, murmurs, rubs, gallops or clicks. No pedal edema.  A port on the left chest. Gastrointestinal system: Abdomen is nondistended, soft and nontender. No organomegaly or masses felt. Normal bowel sounds heard. Central nervous system: Alert and oriented. No focal neurological deficits. Extremities: Symmetric 5 x 5 power. Skin: No rashes, lesions or ulcers Psychiatry: Judgement and insight appear normal. Mood & affect appropriate.     Data Reviewed: I have personally reviewed following labs and imaging studies  CBC: Recent Labs  Lab 11/26/18 1526 12/01/18 1244 12/02/18 0415  WBC 0.3* 1.0* 1.5*  NEUTROABS 0.0* 0.7* 1.0*  HGB 11.1* 8.7* 8.5*  HCT 33.7* 26.1* 26.6*  MCV 86.4 85.6 88.4  PLT 12* 55* 74*   Basic Metabolic Panel: Recent Labs  Lab 11/26/18 1526 12/01/18 1244  12/02/18 0415  NA 132* 129* 131*  K 4.0 3.2* 3.3*  CL 98 96* 100  CO2 24 22 23   GLUCOSE 264* 179* 236*  BUN 23 31* 34*  CREATININE 0.81 1.11* 1.15*  CALCIUM 8.4* 7.7* 7.1*  PHOS 2.2* 1.9*  --    GFR: Estimated Creatinine Clearance: 47.3 mL/min (A) (by C-G formula based on SCr of 1.15 mg/dL (H)). Liver Function Tests: Recent Labs   Lab 11/26/18 1526 12/01/18 1244  AST 8* 17  ALT 12 10  ALKPHOS 84 112  BILITOT 1.5* 2.2*  PROT 5.3* 5.3*  ALBUMIN 3.1* 2.3*   No results for input(s): LIPASE, AMYLASE in the last 168 hours. No results for input(s): AMMONIA in the last 168 hours. Coagulation Profile: No results for input(s): INR, PROTIME in the last 168 hours. Cardiac Enzymes: No results for input(s): CKTOTAL, CKMB, CKMBINDEX, TROPONINI in the last 168 hours. BNP (last 3 results) No results for input(s): PROBNP in the last 8760 hours. HbA1C: No results for input(s): HGBA1C in the last 72 hours. CBG: No results for input(s): GLUCAP in the last 168 hours. Lipid Profile: No results for input(s): CHOL, HDL, LDLCALC, TRIG, CHOLHDL, LDLDIRECT in the last 72 hours. Thyroid Function Tests: Recent Labs    12/02/18 0415  TSH 1.809   Anemia Panel: No results for input(s): VITAMINB12, FOLATE, FERRITIN, TIBC, IRON, RETICCTPCT in the last 72 hours. Sepsis Labs: No results for input(s): PROCALCITON, LATICACIDVEN in the last 168 hours.  No results found for this or any previous visit (from the past 240 hour(s)).       Radiology Studies: No results found.      Scheduled Meds: . acyclovir  200 mg Oral 5 X Daily  . allopurinol  300 mg Oral Daily  . hydrocortisone   Rectal TID  . levothyroxine  88 mcg Oral Q0600  . potassium chloride  40 mEq Oral BID  . sodium chloride flush  3 mL Intravenous Q12H   Continuous Infusions: . dextrose 5 % and 0.9 % NaCl with KCl 20 mEq/L 100 mL/hr at 12/02/18 0502     LOS: 0 days    Time spent: 25 minutes     Barb Merino, MD [facility] Pager [phone]  If 7PM-7AM, please contact night-coverage [email or web address] Password [email or web address] 12/02/2018, 2:20 PM

## 2018-12-02 NOTE — Evaluation (Signed)
Occupational Therapy Evaluation Patient Details Name: Maria [name] MRN: [id number] DOB: 1950-09-20 Today's Date: 12/02/2018    History of Present Illness 68 y.o. 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 Admission was requested for IVIG and IVF's with electrolyte supplementation.    Clinical Impression   This 68 y/o female presents with the above. At baseline pt reports she is independent with ADL and functional mobility. Pt reports she lives alone but will have niece staying with her initially to assist at time of discharge. Pt requiring overall minA for room level mobility using RW; she currently requires minguard assist for standing grooming ADL, min-modA for LB and toileting ADLs. She will benefit from continued acute OT services and recommend follow up [facility] services after discharge to maximize her safety and independence with ADL and mobility. Will follow.     Follow Up Recommendations  Home health OT;Supervision/Assistance - 24 hour    Equipment Recommendations  3 in 1 bedside commode           Precautions / Restrictions Precautions Precautions: Fall Restrictions Weight Bearing Restrictions: No      Mobility Bed Mobility               General bed mobility comments: up in recliner upon arrival  Transfers Overall transfer level: Needs assistance Equipment used: Rolling walker (2 wheeled) Transfers: Sit to/from Stand Sit to Stand: Min assist;Mod assist         General transfer comment: boosting assist from recliner and toilet with assist to steady at RW; VCs for hand placement    Balance Overall balance assessment: Needs assistance Sitting-balance support: Feet supported Sitting balance-Leahy Scale:  Good     Standing balance support: Bilateral upper extremity supported;During functional activity Standing balance-Leahy Scale: Fair Standing balance comment: minguard for static standing; reliant on UE support for dynamic mobility                           ADL either performed or assessed with clinical judgement   ADL Overall ADL's : Needs assistance/impaired Eating/Feeding: Modified independent;Sitting   Grooming: Min guard;Standing;Wash/dry hands;Wash/dry face;Oral care   Upper Body Bathing: Set up;Min guard;Sitting   Lower Body Bathing: Minimal assistance;Sit to/from stand   Upper Body Dressing : Min guard;Set up;Sitting   Lower Body Dressing: Moderate assistance;Sit to/from stand Lower Body Dressing Details (indicate cue type and reason): pt is able to don L shoe Toilet Transfer: Minimal assistance;Ambulation;Regular Toilet;Grab bars;RW Armed forces technical officer Details (indicate cue type and reason): min-modA for sit<>stand; minA-minguard for mobility to bathroom Toileting- Clothing Manipulation and Hygiene: Minimal assistance;Sit to/from stand Toileting - Clothing Manipulation Details (indicate cue type and reason): pt performing lateral leans for pericare; minA for clothing management (pants/underwear)      Functional mobility during ADLs: Minimal assistance;Min guard;Rolling walker       Vision         Perception     Praxis      Pertinent Vitals/Pain Pain Assessment: Faces     Hand Dominance     Extremity/Trunk Assessment Upper Extremity Assessment Upper Extremity Assessment: Generalized weakness   Lower Extremity Assessment Lower Extremity Assessment: Defer to PT evaluation       Communication Communication Communication: No difficulties  Cognition Arousal/Alertness: Awake/alert Behavior During Therapy: WFL for tasks assessed/performed Overall Cognitive Status: Within Functional Limits for tasks assessed                                      General Comments       Exercises     Shoulder Instructions      Home Living Family/patient expects to be discharged to:: Private residence Living Arrangements: Alone Available Help at Discharge: Family(neice to stay until end of week) Type of Home: House Home Access: Stairs to enter [employer] of Steps: 1   Home Layout: Two level;Able to live on main level with bedroom/bathroom     Bathroom Shower/Tub: Occupational psychologist: Standard     Home Equipment: Environmental consultant - 2 wheels;Shower seat          Prior Functioning/Environment Level of Independence: Independent                 OT Problem List: Decreased strength;Decreased range of motion;Decreased activity tolerance;Impaired balance (sitting and/or standing);Decreased knowledge of use of DME or AE      OT Treatment/Interventions: Self-care/ADL training;Therapeutic exercise;Neuromuscular education;Energy conservation;DME and/or AE instruction;Therapeutic activities;Patient/family education;Balance training    OT Goals(Current goals can be found in the care plan section) Acute Rehab OT Goals Patient Stated Goal: home soon OT Goal Formulation: With patient Time For Goal Achievement: 12/16/18 Potential to Achieve Goals: Good  OT Frequency: Min 2X/week   Barriers to D/C:            Co-evaluation              AM-PAC OT "6 Clicks" Daily Activity     Outcome Measure Help from another person eating meals?: None Help from another person taking care of personal grooming?: A Little Help from another person toileting, which includes using toliet, bedpan, or urinal?: A Little Help from another person bathing (including washing, rinsing, drying)?: A Little Help from another person to put on and taking off regular upper body clothing?: None Help from another person to put on and taking off regular lower body clothing?: A Lot 6 Click Score: 19   End of Session Equipment Utilized  During Treatment: Gait belt;Rolling walker Nurse Communication: Mobility status  Activity Tolerance: Patient tolerated treatment well Patient left: Other (comment);with call bell/phone within reach(handoff to PT for session completion)  OT Visit Diagnosis: Muscle weakness (generalized) (M62.81);Unsteadiness on feet (R26.81)                Time: 0981-1914 OT Time Calculation (min): 31 min Charges:  OT General Charges $OT Visit: 1 Visit OT Evaluation $OT Eval Moderate Complexity: 1 Mod OT Treatments $Self Care/Home Management : 8-22 mins  Lou Cal, OT Supplemental Rehabilitation Services Pager [phone] Office [place] 12/02/2018, 1:08 PM

## 2018-12-02 NOTE — Progress Notes (Signed)
Maria [name]   DOB:August 02, 1951   FT#:[id number]   HCW#:[id number]  Subjective:  Maria [name] is mentally back to baseline today. She has pretty much stayed in bed and had a soft BP this AM, but has made it to the BR several times w/o passing out. No BM (had severe diarrhea pre admission). Receiving IVIG w/o reaction so far. No family in room  Objective: Middle-aged white woman examined in bed Vitals:   12/02/18 1313 12/02/18 1340  BP: (!) 105/54 (!) 113/48  Pulse: 79 88  Resp:    Temp: 98.8 F (37.1 C) 99 F (37.2 C)  SpO2: 98% 98%    Body mass index is 33.29 kg/m.  Intake/Output Summary (Last 24 hours) at 12/02/2018 1357 Last data filed at 12/02/2018 1322 Gross per 24 hour  Intake 66.01 ml  Output -  Net 66.01 ml   CBG (last 3)  No results for input(s): GLUCAP in the last 72 hours.   Labs:  Lab Results  Component Value Date   WBC 1.5 (L) 12/02/2018   HGB 8.5 (L) 12/02/2018   HCT 26.6 (L) 12/02/2018   MCV 88.4 12/02/2018   PLT 74 (L) 12/02/2018   NEUTROABS 1.0 (L) 12/02/2018    @LASTCHEMISTRY @  Urine Studies No results for input(s): UHGB, CRYS in the last 72 hours.  Invalid input(s): UACOL, UAPR, USPG, UPH, UTP, UGL, UKET, UBIL, UNIT, UROB, ULEU, UEPI, UWBC, [place], [place], CAST, [place], [place]  Basic Metabolic Panel: Recent Labs  Lab 11/26/18 1526 12/01/18 1244 12/02/18 0415  NA 132* 129* 131*  K 4.0 3.2* 3.3*  CL 98 96* 100  CO2 24 22 23   GLUCOSE 264* 179* 236*  BUN 23 31* 34*  CREATININE 0.81 1.11* 1.15*  CALCIUM 8.4* 7.7* 7.1*  PHOS 2.2* 1.9*  --    GFR Estimated Creatinine Clearance: 47.3 mL/min (A) (by C-G formula based on SCr of 1.15 mg/dL (H)). Liver Function Tests: Recent Labs  Lab 11/26/18 1526 12/01/18 1244  AST 8* 17  ALT 12 10  ALKPHOS 84 112  BILITOT 1.5* 2.2*  PROT 5.3* 5.3*  ALBUMIN 3.1* 2.3*   No results for input(s): LIPASE, AMYLASE in the last 168 hours. No results for input(s): AMMONIA in the last 168 hours. Coagulation  profile No results for input(s): INR, PROTIME in the last 168 hours.  CBC: Recent Labs  Lab 11/26/18 1526 12/01/18 1244 12/02/18 0415  WBC 0.3* 1.0* 1.5*  NEUTROABS 0.0* 0.7* 1.0*  HGB 11.1* 8.7* 8.5*  HCT 33.7* 26.1* 26.6*  MCV 86.4 85.6 88.4  PLT 12* 55* 74*   Cardiac Enzymes: No results for input(s): CKTOTAL, CKMB, CKMBINDEX, TROPONINI in the last 168 hours. BNP: Invalid input(s): POCBNP CBG: No results for input(s): GLUCAP in the last 168 hours. D-Dimer No results for input(s): DDIMER in the last 72 hours. Hgb A1c No results for input(s): HGBA1C in the last 72 hours. Lipid Profile No results for input(s): CHOL, HDL, LDLCALC, TRIG, CHOLHDL, LDLDIRECT in the last 72 hours. Thyroid function studies Recent Labs    12/02/18 0415  TSH 1.809   Anemia work up No results for input(s): VITAMINB12, FOLATE, FERRITIN, TIBC, IRON, RETICCTPCT in the last 72 hours. Microbiology No results found for this or any previous visit (from the past 240 hour(s)).    Studies:  No results found.  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 Her ANC is coming up and her platelets continue to normalize. Her creat is still not at baseline and some electrolytes are still slightly off but clinically she is improved and I expect she will be able to be discharged home tomorrow.   She is scheduled for cycle 2 on 04/08 but we will likely hold the CHOP portion and treat with the antibody alone, then resuming chemo 3 weeks later at half dose. I would like to continue to reduce the tumor burden (her adenopathy is greatly improved) before switching to venetoclax.  Please let me know if I can be of further help   Chauncey Cruel, MD 12/02/2018  1:57 PM Medical Oncology and Hematology [facility] [address] [place], [place] [address] Tel. [phone]    Fax. [phone]

## 2018-12-02 NOTE — Progress Notes (Signed)
Verified with MD IVIG order is x 1 dose.  Called and spoke with Erasmo Downer in pt pharmacy.

## 2018-12-02 NOTE — Progress Notes (Signed)
Maria [name]   DOB:05-31-1951   SH#:[id number]   IFO#:[id number]  Subjective:  Maria [name] was supposed to have received platelets and IVIG last week, but she did not show.  She said she did not feel well.  Things have not improved since, and she called over the weekend to report that she fell on Saturday, was unable to get up from the floor all night, and finally was able to get a hold of a neighbor the next day.  Her niece has been staying with her since and helping out, but she is appropriately concerned because the patient is very weak, confused, had stool incontinence last night, and of course remains severely immunocompromised.  The patient was seen at the cancer center on 12/01/2018.  She was found to be confused with hypotension.  She was afebrile but had neutropenia and thrombocytopenia.  She also had electrolyte abnormalities.  She was admitted to the hospital for correction of these issues and administration of IVIG.  When seen today, the patient reports ongoing weakness.  She has remained afebrile.  Denies chest pain and shortness of breath.  She has remained hypotensive at times in the hospital but denies symptoms such as dizziness.   Objective: Middle-aged white woman sitting in recliner.  No acute distress. Vitals:   12/02/18 0920 12/02/18 0937  BP: (!) 118/50 (!) 118/50  Pulse: 88 88  Resp: 18   Temp: (!) 97.5 F (36.4 C) (!) 97.5 F (36.4 C)  SpO2: 99% 98%    Body mass index is 33.29 kg/m.  Intake/Output Summary (Last 24 hours) at 12/02/2018 0824 Last data filed at 12/01/2018 1641 Gross per 24 hour  Intake 0 ml  Output -  Net 0 ml     Sclerae unicteric  Her massive palpable cervical/supraclavicular adenopathy, left greater than right, is significantly improved  Lungs no rales or wheezes  Heart regular rate and rhythm  Abdomen soft, +BS  Neuro knows place and person but barely holds onto a thread of discourse and perseverates  Breast exam: Deferred  CBG (last 3)   No results for input(s): GLUCAP in the last 72 hours.   Labs:  Lab Results  Component Value Date   WBC 1.5 (L) 12/02/2018   HGB 8.5 (L) 12/02/2018   HCT 26.6 (L) 12/02/2018   MCV 88.4 12/02/2018   PLT 74 (L) 12/02/2018   NEUTROABS 1.0 (L) 12/02/2018   Urine Studies No results for input(s): UHGB, CRYS in the last 72 hours.  Invalid input(s): UACOL, UAPR, USPG, UPH, UTP, UGL, UKET, UBIL, UNIT, UROB, ULEU, UEPI, UWBC, [place], [place], CAST, [place], [place]  Basic Metabolic Panel: Recent Labs  Lab 11/26/18 1526 12/01/18 1244 12/02/18 0415  NA 132* 129* 131*  K 4.0 3.2* 3.3*  CL 98 96* 100  CO2 24 22 23   GLUCOSE 264* 179* 236*  BUN 23 31* 34*  CREATININE 0.81 1.11* 1.15*  CALCIUM 8.4* 7.7* 7.1*  PHOS 2.2* 1.9*  --    GFR Estimated Creatinine Clearance: 47.3 mL/min (A) (by C-G formula based on SCr of 1.15 mg/dL (H)). Liver Function Tests: Recent Labs  Lab 11/26/18 1526 12/01/18 1244  AST 8* 17  ALT 12 10  ALKPHOS 84 112  BILITOT 1.5* 2.2*  PROT 5.3* 5.3*  ALBUMIN 3.1* 2.3*   No results for input(s): LIPASE, AMYLASE in the last 168 hours. No results for input(s): AMMONIA in the last 168 hours. Coagulation profile No results for input(s): INR, PROTIME in the last 168 hours.  CBC:  Recent Labs  Lab 11/26/18 1526 12/01/18 1244 12/02/18 0415  WBC 0.3* 1.0* 1.5*  NEUTROABS 0.0* 0.7* 1.0*  HGB 11.1* 8.7* 8.5*  HCT 33.7* 26.1* 26.6*  MCV 86.4 85.6 88.4  PLT 12* 55* 74*   Cardiac Enzymes: No results for input(s): CKTOTAL, CKMB, CKMBINDEX, TROPONINI in the last 168 hours. BNP: Invalid input(s): POCBNP CBG: No results for input(s): GLUCAP in the last 168 hours. D-Dimer No results for input(s): DDIMER in the last 72 hours. Hgb A1c No results for input(s): HGBA1C in the last 72 hours. Lipid Profile No results for input(s): CHOL, HDL, LDLCALC, TRIG, CHOLHDL, LDLDIRECT in the last 72 hours. Thyroid function studies Recent Labs    12/02/18 0415  TSH 1.809    Anemia work up No results for input(s): VITAMINB12, FOLATE, FERRITIN, TIBC, IRON, RETICCTPCT in the last 72 hours. Microbiology No results found for this or any previous visit (from the past 240 hour(s)).    Studies:  No results found.  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 She was severely thrombocytopenic from treatment, but her platelets are now >50K without transfusion. She was also severely neutropenic. Today is day 12 from pegfilgastrim and he Maria [name] is just beginning to rebound.  Labs from today have been reviewed.  Total  white blood cell count is trending upward and is up to 1.5 today.  ANC is up to 1.0 today.  She remains afebrile.  She continues to have mild anemia with a hemoglobin of 8.5 which is stable for her.  No transfusion is indicated at this time.  Platelets continue to improve to 74,000 today without transfusion.  She also has a very low IgG level.  She had been scheduled for IVIG last week but she was not able to make it.  IVIG will be administered today.  Electrolyte repletion per hospitalist team.  Hypotension is improving with a blood pressure up to 118/50 this morning.  The patient is continuing to improve with  correction in her electrolytes and administration of IVIG later today.  ANC is a little bit better.  Hopefully discharge 12/03/2018, but will defer decision regarding discharge to hospitalist.  I greatly appreciate the hospitalist service's help with this patient.  We will follow with you   Mikey Bussing, NP 12/02/2018  8:24 AM Medical Oncology and Hematology [facility] [address] [place], [place] [address] Tel. [phone]    Fax. [phone]

## 2018-12-02 NOTE — Evaluation (Signed)
Physical Therapy Evaluation Patient Details Name: Maria [name] MRN: [id number] DOB: [birth year] Today's Date: 12/02/2018   History of Present Illness  68 yo female admitted with dehydration, AKI, fall at home. Hx of CLL   Clinical Impression  On eval, pt required Min assist for mobility. She walked ~15 feet with a RW. Pt denied lightheadedness/dizziness during session. Assisted back to bed at pt's request. Will continue to follow.     Follow Up Recommendations Home health PT;Supervision - Intermittent    Equipment Recommendations  None recommended by PT    Recommendations for Other Services       Precautions / Restrictions Precautions Precautions: Fall Restrictions Weight Bearing Restrictions: No      Mobility  Bed Mobility               General bed mobility comments: up with OT at start of session  Transfers Overall transfer level: Needs assistance Equipment used: Rolling walker (2 wheeled) Transfers: Sit to/from Stand Sit to Stand: Min assist         General transfer comment: Assist to rise, stabilize, control descent VCs hand placement.   Ambulation/Gait Ambulation/Gait assistance: Min assist Gait Distance (Feet): 15 Feet Assistive device: Rolling walker (2 wheeled) Gait Pattern/deviations: Step-through pattern;Decreased stride length     General Gait Details: slow gait speed. Intermittent assist provided for steadying. Distance limited by IV RN waiting to see/work with pt.   Stairs            Wheelchair Mobility    Modified Rankin (Stroke Patients Only)       Balance Overall balance assessment: Needs assistance Sitting-balance support: Feet supported Sitting balance-Leahy Scale: Good     Standing balance support: Bilateral upper extremity supported;Single extremity supported Standing balance-Leahy Scale: Poor Standing balance comment: minguard for static standing; reliant on UE support for dynamic mobility                             Pertinent Vitals/Pain Pain Assessment: Faces Pain Score: 0-No pain    Home Living Family/patient expects to be discharged to:: Private residence Living Arrangements: Alone Available Help at Discharge: Family(niece until 3/28) Type of Home: House Home Access: Stairs to enter   [employer] of Steps: 1 Home Layout: Two level;Able to live on main level with bedroom/bathroom Home Equipment: Gilford Rile - 2 wheels;Shower seat      Prior Function Level of Independence: Independent               Hand Dominance        Extremity/Trunk Assessment   Upper Extremity Assessment Upper Extremity Assessment: Defer to OT evaluation    Lower Extremity Assessment Lower Extremity Assessment: Generalized weakness    Cervical / Trunk Assessment Cervical / Trunk Assessment: Normal  Communication   Communication: No difficulties  Cognition Arousal/Alertness: Awake/alert Behavior During Therapy: WFL for tasks assessed/performed Overall Cognitive Status: Within Functional Limits for tasks assessed                                        General Comments      Exercises     Assessment/Plan    PT Assessment Patient needs continued PT services  PT Problem List Decreased strength;Decreased balance;Decreased mobility;Decreased activity tolerance;Decreased knowledge of use of DME       PT Treatment Interventions DME instruction;Gait training;Therapeutic activities;Functional  mobility training;Balance training;Patient/family education;Therapeutic exercise    PT Goals (Current goals can be found in the Care Plan section)  Acute Rehab PT Goals Patient Stated Goal: home soon PT Goal Formulation: With patient Time For Goal Achievement: 12/16/18 Potential to Achieve Goals: Good    Frequency Min 3X/week   Barriers to discharge        Co-evaluation               AM-PAC PT "6 Clicks" Mobility  Outcome Measure Help needed turning  from your back to your side while in a flat bed without using bedrails?: A Little Help needed moving from lying on your back to sitting on the side of a flat bed without using bedrails?: A Little Help needed moving to and from a bed to a chair (including a wheelchair)?: A Little Help needed standing up from a chair using your arms (e.g., wheelchair or bedside chair)?: A Little Help needed to walk in hospital room?: A Little Help needed climbing 3-5 steps with a railing? : A Little 6 Click Score: 18    End of Session Equipment Utilized During Treatment: Gait belt Activity Tolerance: Patient tolerated treatment well Patient left: in bed;with call bell/phone within reach;with bed alarm set   PT Visit Diagnosis: Unsteadiness on feet (R26.81);Muscle weakness (generalized) (M62.81)    Time: 5183-3582 PT Time Calculation (min) (ACUTE ONLY): 21 min   Charges:   PT Evaluation $PT Eval Moderate Complexity: [place], PT Acute Rehabilitation Services Pager: [phone] Office: [phone]

## 2018-12-03 ENCOUNTER — Other Ambulatory Visit: Payer: Self-pay | Admitting: Oncology

## 2018-12-03 ENCOUNTER — Telehealth: Payer: Self-pay | Admitting: *Deleted

## 2018-12-03 DIAGNOSIS — E860 Dehydration: Principal | ICD-10-CM

## 2018-12-03 LAB — RETICULOCYTES
Immature Retic Fract: 16.1 % — ABNORMAL HIGH (ref 2.3–15.9)
Immature Retic Fract: 16.8 % — ABNORMAL HIGH (ref 2.3–15.9)
RBC.: 2.31 MIL/uL — ABNORMAL LOW (ref 3.87–5.11)
RBC.: 3.49 MIL/uL — ABNORMAL LOW (ref 3.87–5.11)
RETIC CT PCT: 0.7 % (ref 0.4–3.1)
Retic Count, Absolute: 20.3 10*3/uL (ref 19.0–186.0)
Retic Count, Absolute: 25.5 10*3/uL (ref 19.0–186.0)
Retic Ct Pct: 0.9 % (ref 0.4–3.1)

## 2018-12-03 LAB — COMPREHENSIVE METABOLIC PANEL
ALK PHOS: 65 U/L (ref 38–126)
ALT: 11 U/L (ref 0–44)
AST: 12 U/L — AB (ref 15–41)
Albumin: 1.9 g/dL — ABNORMAL LOW (ref 3.5–5.0)
Anion gap: 4 — ABNORMAL LOW (ref 5–15)
BUN: 26 mg/dL — AB (ref 8–23)
CALCIUM: 6.8 mg/dL — AB (ref 8.9–10.3)
CO2: 22 mmol/L (ref 22–32)
Chloride: 105 mmol/L (ref 98–111)
Creatinine, Ser: 0.83 mg/dL (ref 0.44–1.00)
GFR calc Af Amer: >60 mL/min (ref >60)
GFR calc non Af Amer: >60 mL/min (ref >60)
Glucose, Bld: 209 mg/dL — ABNORMAL HIGH (ref 70–99)
Potassium: 4.4 mmol/L (ref 3.5–5.1)
SODIUM: 131 mmol/L — AB (ref 135–145)
Total Bilirubin: 0.7 mg/dL (ref 0.3–1.2)
Total Protein: 5.4 g/dL — ABNORMAL LOW (ref 6.5–8.1)

## 2018-12-03 LAB — CBC WITH DIFFERENTIAL/PLATELET
ABS IMMATURE GRANULOCYTES: 0.03 10*3/uL (ref 0.00–0.07)
Basophils Absolute: 0 10*3/uL (ref 0.0–0.1)
Basophils Relative: 0 %
Eosinophils Absolute: 0 10*3/uL (ref 0.0–0.5)
Eosinophils Relative: 0 %
HCT: 20.1 % — ABNORMAL LOW (ref 36.0–46.0)
Hemoglobin: 6.4 g/dL — CL (ref 12.0–15.0)
IMMATURE GRANULOCYTES: 3 %
LYMPHS PCT: 15 %
Lymphs Abs: 0.1 10*3/uL — ABNORMAL LOW (ref 0.7–4.0)
MCH: 28.4 pg (ref 26.0–34.0)
MCHC: 31.8 g/dL (ref 30.0–36.0)
MCV: 89.3 fL (ref 80.0–100.0)
Monocytes Absolute: 0.1 10*3/uL (ref 0.1–1.0)
Monocytes Relative: 6 %
Neutro Abs: 0.7 10*3/uL — ABNORMAL LOW (ref 1.7–7.7)
Neutrophils Relative %: 76 %
Platelets: 64 10*3/uL — ABNORMAL LOW (ref 150–400)
RBC: 2.25 MIL/uL — ABNORMAL LOW (ref 3.87–5.11)
RDW: 14.9 % (ref 11.5–15.5)
WBC: 0.9 10*3/uL — CL (ref 4.0–10.5)
nRBC: 0 % (ref 0.0–0.2)

## 2018-12-03 LAB — MAGNESIUM: Magnesium: 2 mg/dL (ref 1.7–2.4)

## 2018-12-03 LAB — VITAMIN B12: Vitamin B-12: 571 pg/mL (ref 180–914)

## 2018-12-03 LAB — HEMOGLOBIN AND HEMATOCRIT, BLOOD
HCT: 31 % — ABNORMAL LOW (ref 36.0–46.0)
Hemoglobin: 9.9 g/dL — ABNORMAL LOW (ref 12.0–15.0)

## 2018-12-03 LAB — IRON AND TIBC
IRON: 37 ug/dL (ref 28–170)
Saturation Ratios: 26 % (ref 10.4–31.8)
TIBC: 142 ug/dL — ABNORMAL LOW (ref 250–450)
UIBC: 105 ug/dL

## 2018-12-03 LAB — FERRITIN: Ferritin: 181 ng/mL (ref 11–307)

## 2018-12-03 LAB — PREPARE RBC (CROSSMATCH)

## 2018-12-03 LAB — PHOSPHORUS: Phosphorus: 1 mg/dL — CL (ref 2.5–4.6)

## 2018-12-03 LAB — FOLATE: Folate: 6.9 ng/mL (ref >5.9)

## 2018-12-03 MED ORDER — SODIUM CHLORIDE 0.9% IV SOLUTION: Freq: Once | INTRAVENOUS | Status: DC

## 2018-12-03 MED ORDER — SODIUM CHLORIDE 0.9% FLUSH: 10.0000 mL | Freq: Two times a day (BID) | INTRAVENOUS | Status: DC

## 2018-12-03 MED ORDER — SODIUM CHLORIDE 0.9% FLUSH: 10.0000 mL | INTRAVENOUS | Status: DC | PRN

## 2018-12-03 MED ORDER — HYDROCORTISONE 2.5 % RE CREA: TOPICAL_CREAM | Freq: Three times a day (TID) | RECTAL | 0 refills | Status: DC

## 2018-12-03 MED ORDER — TBO-FILGRASTIM 480 MCG/0.8ML ~~LOC~~ SOSY
480.0000 ug | PREFILLED_SYRINGE | Freq: Once | SUBCUTANEOUS | Status: AC
  Administered 2018-12-03: 480 ug via SUBCUTANEOUS
  Filled 2018-12-03: qty 0.8

## 2018-12-03 MED ORDER — POTASSIUM PHOSPHATES 15 MMOLE/5ML IV SOLN
20.0000 meq | Freq: Once | INTRAVENOUS | Status: AC
  Administered 2018-12-03: 20 meq via INTRAVENOUS
  Filled 2018-12-03: qty 4.55

## 2018-12-03 MED ORDER — HEPARIN SOD (PORK) LOCK FLUSH 100 UNIT/ML IV SOLN: 500.0000 [IU] | INTRAVENOUS | Status: DC | PRN

## 2018-12-03 NOTE — Discharge Summary (Signed)
Physician Discharge Summary  Maria [name] YKD:[id number] DOB: 04-Mar-1951 DOA: 12/01/2018  PCP: Marton Redwood, MD  Admit date: 12/01/2018 Discharge date: 12/03/2018  Admitted From: Home Discharge disposition: home   Code Status: Full Code   Recommendations for Outpatient Follow-Up:   1. Follow-up with GI as an outpatient 2. 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 Recently found to have disease progression resulting in changes to chemotherapy regimen which has been complicated by pancytopenia. She was admitted from the cancer center a couple of days ago after presenting with weakness,hypotension, recent fall, diminished appetite and some stool incontinence. Labs demonstrated AKi, electrolyte abnormalities, ongoing pancytopenia. At the time she was on day 14 cycle 1 of CHOP / obinutuzumab.   Blima fell at home during the night Friday. She had a soft BM once able to get up but on Saturday had a BM with blood / rectal discomfort she attributes to hemorrhoids. She continued to have some bleeding with each BM but since treating hemorrhoids the discomfort and bleeding have both significantly improved. She hasn't had any bowel changes. No dark stools. No NSAID use. She denies abdominal pain, no nausea / vomiting.  She was admitted for hypotension, weakness, AKI, hyperkalemia and pancytopenia.  She was treated with IV fluids, IV hemoglobin and electrolyte supplementation.  Hospital Course:   Moderate dehydration/AKI and hypotension/hypokalemia/hypomagnesemia and low  phosphorus Patient feels better.  Weakness resolved.  Blood pressure normal to low normal.  Adequately hydrated.  Electrolyte levels today shows potassium level improved to 4.4, magnesium at 2.  Phosphorus low at 1.  IV replacement ordered. Prior to admission, patient was on HCTZ which is currently on hold.  Based on her low blood pressure readings, I would not yet resume HCTZ.  Neutropenia: Improving status post Pegfilgrastim.  Improving.  Thrombocytopenia: Platelet levels are adequate and improving.  Hypogammaglobulinemia: Receiving IVIG. She had low-grade fever with infusion. Improved.  Acute anemia on anemia of chronic disease: Hemoglobin down to 6.4 this morning, down from 8.5 yesterday.  FOBT positive.  2 units of PRBCs ordered.  Because of shortness of blood products, patient will get 1 unit transfused today.  If hemoglobin stays less than 7, she will get second unit as well.  GI consulted.  Likely hemorrhoidal bleeding.  Colonoscopy in 2015 essentially negative.  Patient prefers further work-up to be done as an outpatient.  GI to set up an appointment for follow-up.   CLL - continue outpatient care with Dr. Jana Hakim.  Hypothyroidism: On Synthroid. TSH 1.8.  Medical Consultants:    Oncology   Subjective:  Patient was seen and examined this morning.  Pleasant elderly Caucasian female.  Sitting up in chair.  Not in distress.  She initially was reluctant to get blood products and wanted to confirm with Dr. Jana Hakim.  I spoke with Dr. Jana Hakim.  Plan is to transfuse PRBCs.  If hemoglobin less than 7 after 1 unit, will transfuse second unit.   Discharge Exam:   Vitals:   12/03/18 0518 12/03/18 1021 12/03/18 1036 12/03/18 1050  BP: (!) 127/50 (!) 111/50 (!) 111/50 119/63  Pulse: 77 82 82 90  Resp:    20  Temp: 98.1 F (36.7 C) 98.8 F (37.1 C) 98.8 F (37.1 C) 98.6 F (37 C)  TempSrc:  Oral Oral Oral  SpO2: 97% 100% 100% 100%  Weight:      Height:        Body mass  index is 33.29 kg/m.  General exam: Appears calm and comfortable.  Skin: No rashes, lesions or ulcers. HEENT: Normal Lungs: Clear to auscultation bilaterally CVS: Regular rate and rhythm, no murmur GI/Abd soft, nontender, nondistended, bowel sound present CNS: Alert, awake, oriented x3, slow to respond. Psychiatry: Mood & affect appropriate.  Extremities: No pedal edema, no calf tenderness  Discharge Instructions:  Wound care: None Discharge Instructions    Diet - low sodium heart healthy   Complete by:  As directed    Increase activity slowly   Complete by:  As directed      Follow-up Information    [name], [name] Dad, MD Follow up.   Specialty:  Oncology Contact information: [place] [address] [phone]          Allergies as of 12/03/2018      Reactions   Iohexol Swelling, Rash   Compazine [prochlorperazine Edisylate] Other (See Comments)   Mild confusion   Contrast Media  [iodinated Diagnostic Agents]    Lactose Intolerance (gi)    Diarrhea, gas bloating   Lactulose Diarrhea, Other (See Comments)   gas bloating   Cephalexin Rash      Medication List    STOP taking these medications   hydrochlorothiazide 12.5 MG tablet Commonly known as:  HYDRODIURIL     TAKE these medications   acyclovir 200 MG capsule Commonly known as:  ZOVIRAX Take 200 mg by mouth 5 (five) times daily.   allopurinol 300 MG tablet Commonly known as:  ZYLOPRIM Take 1 tablet (300 mg total) by mouth daily.   hydrocortisone 2.5 % rectal cream Commonly known as:  ANUSOL-HC Place rectally 3 (three) times daily.   lidocaine-prilocaine cream Commonly known as:  EMLA Apply to affected area once   LORazepam 0.5 MG tablet Commonly known as:  Ativan Take 1 tablet (0.5 mg total) by mouth at bedtime as needed (Nausea or vomiting).   ondansetron 8 MG tablet Commonly known as:  ZOFRAN Take 1 tablet (8 mg total) by mouth every 8 (eight) hours as needed for  nausea or vomiting.   oxyCODONE-acetaminophen 5-325 MG tablet Commonly known as:  PERCOCET/ROXICET Take 1-2 tablets by mouth every 6 (six) hours as needed for severe pain.   predniSONE 20 MG tablet Commonly known as:  Deltasone Take 3 tablets (60 mg total) by mouth daily with breakfast. Take days 1-5 starting on CHOP chemo day   Reclast 5 MG/100ML Soln injection Generic drug:  zoledronic acid Inject 5 mg into the vein once. Once a year   rosuvastatin 10 MG tablet Commonly known as:  CRESTOR Take 10 mg by mouth at bedtime.   Synthroid 88 MCG tablet Generic drug:  levothyroxine Take 88 mcg by mouth daily before breakfast.   Tylenol 8 Hour Arthritis Pain 650 MG CR tablet Generic drug:  acetaminophen Take 650 mg by mouth every 8 (eight) hours as needed for pain.       Time coordinating discharge: 35 minutes  The results of significant diagnostics from this hospitalization (including imaging, microbiology, ancillary and laboratory) are listed below for reference.    Procedures and Diagnostic Studies:  No results found.   Labs:   Basic Metabolic Panel: Recent Labs  Lab 11/26/18 1526 12/01/18 1244 12/02/18 0415 12/03/18 0622  NA 132* 129* 131* 131*  K 4.0 3.2* 3.3* 4.4  CL 98 96* 100 105  CO2 24 22 23 22   GLUCOSE 264* 179* 236* 209*  BUN 23 31* 34* 26*  CREATININE 0.81 1.11* 1.15* 0.83  CALCIUM 8.4* 7.7* 7.1* 6.8*  MG  --   --   --  2.0  PHOS 2.2* 1.9*  --  1.0*   GFR Estimated Creatinine Clearance: 65.5 mL/min (by C-G formula based on SCr of 0.83 mg/dL). Liver Function Tests: Recent Labs  Lab 11/26/18 1526 12/01/18 1244 12/03/18 0622  AST 8* 17 12*  ALT 12 10 11   ALKPHOS 84 112 65  BILITOT 1.5* 2.2* 0.7  PROT 5.3* 5.3* 5.4*  ALBUMIN 3.1* 2.3* 1.9*   No results for input(s): LIPASE, AMYLASE in the last 168 hours. No results for input(s): AMMONIA in the last 168 hours. Coagulation profile No results for input(s): INR, PROTIME in the last 168 hours.   CBC: Recent Labs  Lab 11/26/18 1526 12/01/18 1244 12/02/18 0415 12/03/18 0622  WBC 0.3* 1.0* 1.5* 0.9*  NEUTROABS 0.0* 0.7* 1.0* 0.7*  HGB 11.1* 8.7* 8.5* 6.4*  HCT 33.7* 26.1* 26.6* 20.1*  MCV 86.4 85.6 88.4 89.3  PLT 12* 55* 74* 64*   Cardiac Enzymes: No results for input(s): CKTOTAL, CKMB, CKMBINDEX, TROPONINI in the last 168 hours. BNP: Invalid input(s): POCBNP CBG: No results for input(s): GLUCAP in the last 168 hours. D-Dimer No results for input(s): DDIMER in the last 72 hours. Hgb A1c No results for input(s): HGBA1C in the last 72 hours. Lipid Profile No results for input(s): CHOL, HDL, LDLCALC, TRIG, CHOLHDL, LDLDIRECT in the last 72 hours. Thyroid function studies Recent Labs    12/02/18 0415  TSH 1.809   Anemia work up No results for input(s): VITAMINB12, FOLATE, FERRITIN, TIBC, IRON, RETICCTPCT in the last 72 hours. Microbiology No results found for this or any previous visit (from the past 240 hour(s)).  Signed: Terrilee Croak  [facility] 12/03/2018, 11:29 AM

## 2018-12-03 NOTE — Progress Notes (Addendum)
Maria [name]   DOB:[birth year]   OF#:[id number]   DPO#:[id number]  Subjective:  Maria [name] is mentally back to baseline today.  Completed IVIG yesterday and tolerated well.  Has remained afebrile.  Reports having a small amount of bright red blood per rectum.  GI has been consulted.  Receiving blood transfusion this morning.  Objective: Middle-aged white woman examined in bed Vitals:   12/02/18 2048 12/03/18 0518  BP: (!) 112/53 (!) 127/50  Pulse: 83 77  Resp: 20   Temp: 98.8 F (37.1 C) 98.1 F (36.7 C)  SpO2: 96% 97%    Body mass index is 33.29 kg/m.  Intake/Output Summary (Last 24 hours) at 12/03/2018 0940 Last data filed at 12/03/2018 0400 Gross per 24 hour  Intake 3375.89 ml  Output -  Net 3375.89 ml   CBG (last 3)  No results for input(s): GLUCAP in the last 72 hours.   Labs:  Lab Results  Component Value Date   WBC 0.9 (LL) 12/03/2018   HGB 6.4 (LL) 12/03/2018   HCT 20.1 (L) 12/03/2018   MCV 89.3 12/03/2018   PLT 64 (L) 12/03/2018   NEUTROABS 0.7 (L) 12/03/2018   Urine Studies No results for input(s): UHGB, CRYS in the last 72 hours.  Invalid input(s): UACOL, UAPR, USPG, UPH, UTP, UGL, UKET, UBIL, UNIT, UROB, [place], UEPI, UWBC, Duwayne Heck [place], [place]  Basic Metabolic Panel: Recent Labs  Lab 11/26/18 1526 12/01/18 1244 12/02/18 0415 12/03/18 0622  NA 132* 129* 131* 131*  K 4.0 3.2* 3.3* 4.4  CL 98 96* 100 105  CO2 24 22 23 22   GLUCOSE 264* 179* 236* 209*  BUN 23 31* 34* 26*  CREATININE 0.81 1.11* 1.15* 0.83  CALCIUM 8.4* 7.7* 7.1* 6.8*  MG  --   --   --  2.0  PHOS 2.2* 1.9*  --  1.0*   GFR Estimated Creatinine Clearance: 65.5 mL/min (by C-G formula based on SCr of 0.83 mg/dL). Liver Function Tests: Recent Labs  Lab 11/26/18 1526 12/01/18 1244 12/03/18 0622  AST 8* 17 12*  ALT 12 10 11   ALKPHOS 84 112 65  BILITOT 1.5* 2.2* 0.7  PROT 5.3* 5.3* 5.4*  ALBUMIN 3.1* 2.3* 1.9*   No results for input(s): LIPASE, AMYLASE in the last 168  hours. No results for input(s): AMMONIA in the last 168 hours. Coagulation profile No results for input(s): INR, PROTIME in the last 168 hours.  CBC: Recent Labs  Lab 11/26/18 1526 12/01/18 1244 12/02/18 0415 12/03/18 0622  WBC 0.3* 1.0* 1.5* 0.9*  NEUTROABS 0.0* 0.7* 1.0* 0.7*  HGB 11.1* 8.7* 8.5* 6.4*  HCT 33.7* 26.1* 26.6* 20.1*  MCV 86.4 85.6 88.4 89.3  PLT 12* 55* 74* 64*   Cardiac Enzymes: No results for input(s): CKTOTAL, CKMB, CKMBINDEX, TROPONINI in the last 168 hours. BNP: Invalid input(s): POCBNP CBG: No results for input(s): GLUCAP in the last 168 hours. D-Dimer No results for input(s): DDIMER in the last 72 hours. Hgb A1c No results for input(s): HGBA1C in the last 72 hours. Lipid Profile No results for input(s): CHOL, HDL, LDLCALC, TRIG, CHOLHDL, LDLDIRECT in the last 72 hours. Thyroid function studies Recent Labs    12/02/18 0415  TSH 1.809   Anemia work up No results for input(s): VITAMINB12, FOLATE, FERRITIN, TIBC, IRON, RETICCTPCT in the last 72 hours. Microbiology No results found for this or any previous visit (from the past 240 hour(s)).    Studies:  No results found.  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             (b) rituximab added to ibrutinib, first dose 08/27/2018             (c) hepatitis B studies 08/27/2018 negative             (f) ibrutinib/rituximab discontinued after 10/02/2018 dose w/o obvious response  (g) severe hypogammaglobilinemia due to treatment  (6) bendamustine/rituximab started 10/22/2018, discontinued after 1 cycle with rapid progression  (7) obinutuzumab/Gaziva started 11/11/2018 with evidence of initial response  (8) CHOP/obinutuzumab chemotherapy started 11/19/2018             (a) echocardiogram on 11/07/2018 shows EF of 55-60%  Plan:  Maria [name] is currently day 15 cycle 1 CHOP/obinutuzumab.  Her total white blood cell count remains low at 0.9 with an ANC of 0.7.  She received G-CSF already.  Platelet count is down slightly to 64,000.  Hemoglobin is low at 6.4.  She is receiving a blood transfusion today.  Creatinine is back to baseline.  GI has seen the patient for heme positive stools.  No work-up is planned as an inpatient.  They will see the patient as an outpatient.  She is scheduled for cycle 2 on 04/08 but we will likely hold the CHOP portion and treat with the antibody alone, then resuming chemo 3 weeks later at half dose. I would like to continue to reduce the tumor burden (her adenopathy is greatly improved) before switching to venetoclax.  The patient is plan to discharge later today after her blood transfusion is complete.  She will keep her outpatient follow-up with Dr. Jana Hakim as previously scheduled.  Mikey Bussing, NP 12/03/2018  9:40 AM

## 2018-12-03 NOTE — Progress Notes (Signed)
CRITICAL VALUE ALERT  Critical Value: WBC- 0.9 and Hgb- 6.4  Date & Time Notied:  12/03/2018 @0651   Provider Notified: Tylene Fantasia  Orders Received/Actions taken: pending

## 2018-12-03 NOTE — Progress Notes (Signed)
Occupational Therapy Treatment Patient Details Name: Maria [name] MRN: [id number] DOB: [birth year] Today's Date: 12/03/2018    History of present illness 68 yo female admitted with dehydration, AKI, fall at home. Hx of CLL       Follow Up Recommendations  Home health OT;Supervision/Assistance - 24 hour    Equipment Recommendations  3 in 1 bedside commode    Recommendations for Other Services      Precautions / Restrictions Precautions Precautions: Fall Restrictions Weight Bearing Restrictions: No       Mobility Bed Mobility               General bed mobility comments: in bathroom  Transfers Overall transfer level: Needs assistance Equipment used: Rolling walker (2 wheeled) Transfers: Sit to/from [facility] Sit to Stand: Min guard Stand pivot transfers: Min guard       General transfer comment: Assist to rise, stabilize, control descent VCs hand placement.     Balance Overall balance assessment: Needs assistance         Standing balance support: Bilateral upper extremity supported Standing balance-Leahy Scale: Poor                             ADL either performed or assessed with clinical judgement   ADL               Lower Body Bathing: Minimal assistance;Sit to/from stand;Cueing for sequencing;Cueing for safety           Toilet Transfer: Ambulation;Regular Toilet;Grab bars;RW;Min guard   Toileting- Clothing Manipulation and Hygiene: Minimal assistance;Sit to/from stand         General ADL Comments: discussed organization of bathroom at home in order for pt to be prepared for what she will need as well as a walker bag     Vision       Perception     Praxis      Cognition Arousal/Alertness: Awake/alert Behavior During Therapy: WFL for tasks assessed/performed Overall Cognitive Status: Within Functional Limits for tasks assessed                                           Exercises     Shoulder Instructions       General Comments      Pertinent Vitals/ Pain       Pain Assessment: Faces Pain Score: 2  Faces Pain Scale: No hurt Pain Location: buttocks 2* hemmorrhoids Pain Descriptors / Indicators: Burning;Discomfort;Moaning Pain Intervention(s): Monitored during session      Progress Toward Goals  OT Goals(current goals can now be found in the care plan section)  Progress towards OT goals: Progressing toward goals     Plan Discharge plan remains appropriate       AM-PAC OT "6 Clicks" Daily Activity     Outcome Measure   Help from another person eating meals?: None Help from another person taking care of personal grooming?: A Little Help from another person toileting, which includes using toliet, bedpan, or urinal?: A Little Help from another person bathing (including washing, rinsing, drying)?: A Little Help from another person to put on and taking off regular upper body clothing?: None Help from another person to put on and taking off regular lower body clothing?: A Little 6 Click Score: 20    End of Session Equipment Utilized  During Treatment: Rolling walker  OT Visit Diagnosis: Muscle weakness (generalized) (M62.81);Unsteadiness on feet (R26.81)   Activity Tolerance Patient tolerated treatment well   Patient Left Other (comment)(with PT)   Nurse Communication Mobility status        Time: 3073-5430 OT Time Calculation (min): 15 min  Charges: OT General Charges $OT Visit: 1 Visit OT Treatments $Self Care/Home Management : 8-22 mins  Kari Baars, [place] [phone] Office- [phone]      Zeek Rostron, Edwena Felty D 12/03/2018, 3:30 PM

## 2018-12-03 NOTE — Consult Note (Addendum)
Referring Provider: [facility]  Primary Care Physician:  Marton Redwood, MD Primary Gastroenterologist: Lucio Edward, MD      Reason for Consultation: Heme positive anemia    ASSESSMENT / PLAN:    20. 68 yo female with CLL. Recent disease progression prompting change in chemotherapy regimen. Admitted with weakness, recent fall, hypotension, AKI, electrolyte imbalances, diminished appetite and pancytopenia. Hospitalist repleting fluids and electrolytes.  -Long discussion with patient as she was a little disturbed / confused about all the people involved in care but also concerned that Dr. Jana Hakim is being omitted from hospital care. I explained role of Hospitalist, consultants, etc.  -Blood transfusion was ordered but patient apparently wants to make sure Oncologist aware.  -AKI resolved.   2. [place] anemia, acute. Heme positive stools. Hgb down from ~ 11 on 11/26/18 to 8.7. She has had a concurrent significant decline in WBC and platelets. She had a complete normal screening colonoscopy 4.5 years ago. -will check iron studies but suspect anemia related to chemotherapy.  -Positive hemoccult not surprising with recent rectal bleeding.  -Bleeding probably hemorrhoidal. Fissure also possible as she reported some acute rectal discomfort with the bleeding. Both the discomfort and bleeding are resolving with hemorrhoidal cream.  -Hopefully patient will agree to blood transfusion.  -Probably won't pursue endoscopic workup, at least not inpatient.     2. Weight loss. She was 199 pounds in December, down to 182 pounds this admission. From progression of CLL? She has no N/V, abdominal pain.      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 Recently found to have disease progression resulting in changes to chemotherapy regimen which has been complicated by pancytopenia. She was admitted from the cancer center a couple of  days ago after presenting with weakness,hypotension, recent fall, diminished appetite and some stool incontinence. Labs demonstrated AKi, electrolyte abnormalities, ongoing pancytopenia. At the time she was on day 14 cycle 1 of CHOP / obinutuzumab.   Joyann fell at home during the night Friday. She had a soft BM once able to get up but on Saturday had a BM with blood / rectal discomfort she attributes to hemorrhoids. She continued to have some bleeding with each BM but since treating hemorrhoids the discomfort and bleeding have both significantly improved. She hasn't had any bowel changes. No dark stools. No NSAID use. She denies abdominal pain, no nausea / vomiting.    Sept 2015 Screening colonoscopy  ENDOSCOPIC IMPRESSION: 1. Semi-pedunculated polyp in the transverse colon; polypectomy performed with a cold snare 2. Sessile polyp in the sigmoid colon; polypectomy performed with cold forceps 3. Mild diverticulosis in the sigmoid colon 4. Grade I internal hemorrhoids RECOMMENDATIONS: 1. Await pathology results 2. Repeat colonoscopy in 5 years if polyp(s) adenomatous; otherwise 10 years 3. High fiber diet with liberal fluid intake.  Polyp path - hyperplastic polyp and lymphoid tissue  Past Medical History:  Diagnosis Date  . Allergy   . Arthritis   . Asthma   . Cancer [facility])    chronic lymphacytic leukemia  . CLL (chronic lymphocytic leukemia) ([place])   . Diabetes mellitus    PMH  . Headache    migraine  . Hyperlipidemia   . Hypertension   . Hypothyroidism   . Lymphoma [place])   . Thyroid disease    hypothyroidism  . Wears glasses     Past Surgical History:  Procedure Laterality  Date  . ABDOMINAL HYSTERECTOMY    . AXILLARY LYMPH NODE BIOPSY Right 08/21/2018   Procedure: RIGHT AXILLARY LYMPH NODE BIOPSY ERAS PATHWAY;  Surgeon: [name], MD;  Location: [place];  Service: General;  Laterality: Right;  LMA  . BREAST EXCISIONAL BIOPSY Right   . COLONOSCOPY    . IR IMAGING  GUIDED PORT INSERTION  11/10/2018  . LYMPH NODE DISSECTION    . STRABISMUS SURGERY    . TONSILLECTOMY    . TOTAL KNEE ARTHROPLASTY Right 12/03/2016   Procedure: RIGHT TOTAL KNEE ARTHROPLASTY;  Surgeon: [name], MD;  Location: [facility];  Service: Orthopedics;  Laterality: Right;    Prior to Admission medications   Medication Sig Start Date End Date Taking? Authorizing Provider  acetaminophen (TYLENOL 8 HOUR ARTHRITIS PAIN) 650 MG CR tablet Take 650 mg by mouth every 8 (eight) hours as needed for pain.   Yes [provider]  acyclovir (ZOVIRAX) 200 MG capsule Take 200 mg by mouth 5 (five) times daily.   Yes [provider]  allopurinol (ZYLOPRIM) 300 MG tablet Take 1 tablet (300 mg total) by mouth daily. 11/10/18  Yes [name], [name] Dad, MD  hydrochlorothiazide (HYDRODIURIL) 12.5 MG tablet Take 12.5 mg by mouth daily.    Yes [provider]  lidocaine-prilocaine (EMLA) cream Apply to affected area once 11/03/18  Yes [name], [name] Dad, MD  LORazepam (ATIVAN) 0.5 MG tablet Take 1 tablet (0.5 mg total) by mouth at bedtime as needed (Nausea or vomiting). 11/03/18  Yes [name], [name] Dad, MD  ondansetron (ZOFRAN) 8 MG tablet Take 1 tablet (8 mg total) by mouth every 8 (eight) hours as needed for nausea or vomiting. 10/23/18  Yes [name], [name] Dad, MD  predniSONE (DELTASONE) 20 MG tablet Take 3 tablets (60 mg total) by mouth daily with breakfast. Take days 1-5 starting on CHOP chemo day 11/19/18  Yes Causey, [name], NP  rosuvastatin (CRESTOR) 10 MG tablet Take 10 mg by mouth at bedtime. 07/13/18  Yes [provider]  SYNTHROID 88 MCG tablet Take 88 mcg by mouth daily before breakfast.  02/22/11  Yes [provider]  zoledronic acid (RECLAST) 5 MG/100ML SOLN injection Inject 5 mg into the vein once. Once a year   Yes [provider]  oxyCODONE-acetaminophen (PERCOCET/ROXICET) 5-325 MG tablet Take 1-2 tablets by mouth every 6 (six) hours as needed  for severe pain. 11/03/18   [name], [name] Dad, MD    Current Facility-Administered Medications  Medication Dose Route Frequency Provider Last Rate Last Dose  . 0.9 %  sodium chloride infusion (Manually program via Guardrails IV Fluids)   Intravenous Once Kirby-Graham, Karsten Fells, NP      . acetaminophen (TYLENOL) tablet 650 mg  650 mg Oral Q6H PRN Patrecia Pour, MD   650 mg at 12/02/18 1146   Or  . acetaminophen (TYLENOL) suppository 650 mg  650 mg Rectal Q6H PRN Patrecia Pour, MD      . acyclovir (ZOVIRAX) 200 MG capsule 200 mg  200 mg Oral 5 X Daily Patrecia Pour, MD   200 mg at 12/03/18 3762  . allopurinol (ZYLOPRIM) tablet 300 mg  300 mg Oral Daily Vance Gather B, MD   300 mg at 12/02/18 0900  . dextrose 5 % and 0.9 % NaCl with KCl 20 mEq/L infusion   Intravenous Continuous Patrecia Pour, MD 100 mL/hr at 12/02/18 1825    . hydrocortisone (ANUSOL-HC) 2.5 % rectal cream   Rectal TID  Patrecia Pour, MD      . levothyroxine (SYNTHROID, LEVOTHROID) tablet 88 mcg  88 mcg Oral Q0600 Patrecia Pour, MD   88 mcg at 12/03/18 7829  . LORazepam (ATIVAN) tablet 0.5 mg  0.5 mg Oral QHS PRN Patrecia Pour, MD      . ondansetron [facility]) tablet 4 mg  4 mg Oral Q6H PRN Patrecia Pour, MD       Or  . ondansetron (ZOFRAN) injection 4 mg  4 mg Intravenous Q6H PRN Patrecia Pour, MD      . oxyCODONE-acetaminophen (PERCOCET/ROXICET) 5-325 MG per tablet 1-2 tablet  1-2 tablet Oral Q6H PRN Patrecia Pour, MD   2 tablet at 12/03/18 0508  . potassium & sodium phosphates (PHOS-NAK) 280-160-250 MG packet 1 packet  1 packet Oral TID WC & HS Barb Merino, MD   1 packet at 12/02/18 1551  . potassium PHOSPHATE 20 mEq in dextrose 5 % 250 mL infusion  20 mEq Intravenous Once Dahal, Binaya, MD      . sodium chloride flush (NS) 0.9 % injection 10-40 mL  10-40 mL Intracatheter Q12H Dahal, Binaya, MD      . sodium chloride flush (NS) 0.9 % injection 10-40 mL  10-40 mL Intracatheter PRN Dahal, Binaya, MD      . sodium chloride flush  (NS) 0.9 % injection 3 mL  3 mL Intravenous Q12H Patrecia Pour, MD   3 mL at 12/02/18 0901  . Tbo-Filgrastim (GRANIX) injection 480 mcg  480 mcg Subcutaneous Once [name], [name] Dad, MD      . witch hazel-glycerin (TUCKS) pad   Topical PRN Barb Merino, MD        Allergies as of 12/01/2018 - Review Complete 12/01/2018  Allergen Reaction Noted  . Iohexol Swelling and Rash 03/06/2011  . Compazine [prochlorperazine edisylate] Other (See Comments) 10/22/2018  . Contrast media  [iodinated diagnostic agents]  03/20/2018  . Lactose intolerance (gi)  05/19/2014  . Lactulose Diarrhea and Other (See Comments) 05/19/2014  . Cephalexin Rash     Family History  Problem Relation Age of Onset  . Liver cancer Mother   . Heart disease Father   . Breast cancer Sister 2  . Colon cancer Neg Hx   . Pancreatic cancer Neg Hx   . Rectal cancer Neg Hx   . Stomach cancer Neg Hx     Social History   Socioeconomic History  . Marital status: Single    Spouse name: Not on file  . Number of children: Not on file  . Years of education: Not on file  . Highest education level: Not on file  Occupational History  . Not on file  Social Needs  . Financial resource strain: Not hard at all  . Food insecurity:    Worry: Never true    Inability: Never true  . Transportation needs:    Medical: No    Non-medical: No  Tobacco Use  . Smoking status: Never Smoker  . Smokeless tobacco: Never Used  Substance and Sexual Activity  . Alcohol use: Yes    Comment: occasional alcohol intake; once or twice monthly  . Drug use: No  . Sexual activity: Not on file  Lifestyle  . Physical activity:    Days per week: 0 days    Minutes per session: 0 min  . Stress: Not at all  Relationships  . Social connections:    Talks on phone: Once a week  Gets together: Once a week    Attends religious service: Never    Active member of club or organization: No    Attends meetings of clubs or organizations: Never     Relationship status: Never married  . Intimate partner violence:    Fear of current or ex partner: No    Emotionally abused: No    Physically abused: No    Forced sexual activity: No  Other Topics Concern  . Not on file  Social History Narrative  . Not on file    Review of Systems: All systems reviewed and negative except where noted in HPI.  Physical Exam: Vital signs in last 24 hours: Temp:  [97.5 F (36.4 C)-100.1 F (37.8 C)] 98.1 F (36.7 C) (03/25 0518) Pulse Rate:  [77-90] 77 (03/25 0518) Resp:  [16-20] 20 (03/24 2048) BP: (95-131)/(39-58) 127/50 (03/25 0518) SpO2:  [96 %-100 %] 97 % (03/25 0518) Last BM Date: 11/30/18 General:   Alert, well-developed, female in NAD Psych:  Pleasant, cooperative. Normal mood and affect. Eyes:  Pupils equal, sclera clear, no icterus.   Conjunctiva pale. Ears:  Normal auditory acuity. Nose:  No deformity, discharge,  or lesions. Neck:  Supple; no masses Lungs:  Clear throughout to auscultation.   No wheezes, crackles, or rhonchi.  Heart:  Regular rate and rhythm; no murmurs, pitting BLE edema Abdomen:  Soft, non-distended, nontender, BS active, Rectal:  Deferred  Msk:  Symmetrical without gross deformities. . Neurologic:  Alert and  oriented x4;  grossly normal neurologically. Skin:  Intact without significant lesions or rashes.   Intake/Output from previous day: 03/24 0701 - 03/25 0700 In: 3375.9 [P.O.:200; I.V.:3175.9] Out: -  Intake/Output this shift: No intake/output data recorded.  Lab Results: Recent Labs    12/01/18 1244 12/02/18 0415 12/03/18 0622  WBC 1.0* 1.5* 0.9*  HGB 8.7* 8.5* 6.4*  HCT 26.1* 26.6* 20.1*  PLT 55* 74* 64*   BMET Recent Labs    12/01/18 1244 12/02/18 0415 12/03/18 0622  NA 129* 131* 131*  K 3.2* 3.3* 4.4  CL 96* 100 105  CO2 22 23 22   GLUCOSE 179* 236* 209*  BUN 31* 34* 26*  CREATININE 1.11* 1.15* 0.83  CALCIUM 7.7* 7.1* 6.8*   LFT Recent Labs    12/03/18 0622  PROT 5.4*   ALBUMIN 1.9*  AST 12*  ALT 11  ALKPHOS 65  BILITOT 0.7    Studies/Results: No results found.   Tye Savoy, NP-C @  12/03/2018, 9:01 AM   Attending physician's note   I have taken an interval history, reviewed the chart and examined the patient. I agree with the Advanced Practitioner's note, impression and recommendations.   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  Had heme positive stools without any overt bleeding. Neg screening colonoscopy 05/2014. No N/V/abdominal pain.  All dressed up to go home. Plan: Would like to hold off on any emergent endoscopic procedures as she is not having any overt bleeding and is pancytopenic. FU GI clinic in 4-6 weeks (may have to do tele-visit).  Trend labs as an outpatient.  Carmell Austria, MD

## 2018-12-03 NOTE — Progress Notes (Signed)
Physical Therapy Treatment Patient Details Name: Maria [name] MRN: [id number] DOB: [birth year] Today's Date: 12/03/2018    History of Present Illness 68 yo female admitted with dehydration, AKI, fall at home. Hx of CLL     PT Comments    Progressing with mobility. Plan is for possible d/c home later today.   Follow Up Recommendations  Home health PT;Supervision - Intermittent     Equipment Recommendations  None recommended by PT    Recommendations for Other Services       Precautions / Restrictions Precautions Precautions: Fall Restrictions Weight Bearing Restrictions: No    Mobility  Bed Mobility               General bed mobility comments: sitting EOB  Transfers Overall transfer level: Needs assistance Equipment used: Rolling walker (2 wheeled) Transfers: Sit to/from Stand Sit to Stand: Min assist         General transfer comment: Assist to rise, stabilize, control descent VCs hand placement.   Ambulation/Gait Ambulation/Gait assistance: Min guard Gait Distance (Feet): 50 Feet Assistive device: Rolling walker (2 wheeled) Gait Pattern/deviations: Step-through pattern;Decreased stride length     General Gait Details: slow gait speed. close guard for safety   Stairs             Wheelchair Mobility    Modified Rankin (Stroke Patients Only)       Balance Overall balance assessment: Needs assistance         Standing balance support: Bilateral upper extremity supported Standing balance-Leahy Scale: Poor                              Cognition Arousal/Alertness: Awake/alert Behavior During Therapy: WFL for tasks assessed/performed Overall Cognitive Status: Within Functional Limits for tasks assessed                                        Exercises      General Comments        Pertinent Vitals/Pain Pain Assessment: Faces Faces Pain Scale: No hurt Pain Location: buttocks 2*  hemmorrhoids Pain Descriptors / Indicators: Burning;Discomfort;Moaning Pain Intervention(s): Monitored during session    Home Living                      Prior Function            PT Goals (current goals can now be found in the care plan section) Progress towards PT goals: Progressing toward goals    Frequency    Min 3X/week      PT Plan Current plan remains appropriate    Co-evaluation              AM-PAC PT "6 Clicks" Mobility   Outcome Measure  Help needed turning from your back to your side while in a flat bed without using bedrails?: A Little Help needed moving from lying on your back to sitting on the side of a flat bed without using bedrails?: A Little Help needed moving to and from a bed to a chair (including a wheelchair)?: A Little Help needed standing up from a chair using your arms (e.g., wheelchair or bedside chair)?: A Little Help needed to walk in hospital room?: A Little Help needed climbing 3-5 steps with a railing? : A Little 6 Click Score: 18  End of Session Equipment Utilized During Treatment: Gait belt Activity Tolerance: Patient tolerated treatment well Patient left: in chair;with call bell/phone within reach         Time: 1359-1427 PT Time Calculation (min) (ACUTE ONLY): 28 min  Charges:  $Gait Training: 8-22 mins                        Weston Anna, [place] Pager: [phone] Office: [phone]

## 2018-12-03 NOTE — Telephone Encounter (Signed)
This RN spoke with pt's niece - Nadara Mode- who has been with pt since this weekend due to notable changes.( note Lattie Haw is who called on Monday 3/23 with concerns that brought pt into the hospital ).  Lattie Haw states concerns due to phone calls Illyana is making to family members which are not congruent with pt prior mental status.  " she is saying negative things about the care she is receiving which is not like her at all- she has the highest respects for her team - but now is stating otherwise "  Lattie Haw states at home- pt has not been taking her medications as prescribed and seems paranoid if Lattie Haw attempts to assist her " I have to show her the bottle and then she reads over and will then take it "  " she is calling one of her sisters and telling her 1 thing and then calls her other sister and says something different "  Lattie Haw is concerned due to pt being discharged - and Lattie Haw will have to leave early Saturday to return to work.  Maleigh is becoming more defensive with family members when they try to discuss her health issues and how they can help.  Above reviewed with MD- pt is currently not scheduled to return to the office until 12/17/2018.   Recommendation is for niece to call this office tomorrow with update and possible need for visit on Friday 3/27 for further assessment.

## 2018-12-03 NOTE — Progress Notes (Signed)
PT Cancellation Note  Patient Details Name: Maria [name] MRN: [id number] DOB: 01/08/1951   Cancelled Treatment:    Reason Eval/Treat Not Completed: Medical issues which prohibited therapy. HGB 6.4-pt to get 2 units of blood. Will hold PT for now.   Weston Anna, PT Acute Rehabilitation Services Pager: [phone] Office: [phone]

## 2018-12-03 NOTE — Progress Notes (Signed)
CRITICAL VALUE ALERT  Critical Value:  Phosphorous 1.0  Date & Time Notied:  12/03/2018 7185  Provider Notified: Paged Dahal MD via Shea Evans  Orders Received/Actions taken: Will carry out any new orders as placed and continue to monitor patient

## 2018-12-03 NOTE — Progress Notes (Signed)
OT Cancellation Note  Patient Details Name: Maria [name] MRN: [id number] DOB: 1950/11/17   Cancelled Treatment:    Reason Eval/Treat Not Completed: Other (comment)   Medical issues which prohibited therapy. HGB 6.4-pt to get 2 units of blood. Will hold OT for now.   Kari Baars, OT Acute Rehabilitation Services [phone] Office- [phone], Edwena Felty D 12/03/2018, 1:25 PM

## 2018-12-04 LAB — BPAM RBC
Blood Product Expiration Date: 202004132359
ISSUE DATE / TIME: 202003251029
Unit Type and Rh: 6200

## 2018-12-04 LAB — TYPE AND SCREEN
ABO/RH(D): A POS
Antibody Screen: NEGATIVE
Unit division: 0

## 2018-12-05 ENCOUNTER — Other Ambulatory Visit: Payer: Self-pay | Admitting: Oncology

## 2018-12-05 ENCOUNTER — Other Ambulatory Visit: Payer: Self-pay | Admitting: *Deleted

## 2018-12-05 ENCOUNTER — Inpatient Hospital Stay: Payer: Medicare Other

## 2018-12-05 ENCOUNTER — Ambulatory Visit (HOSPITAL_BASED_OUTPATIENT_CLINIC_OR_DEPARTMENT_OTHER): Payer: Medicare Other | Admitting: Oncology

## 2018-12-05 ENCOUNTER — Other Ambulatory Visit: Payer: Self-pay

## 2018-12-05 VITALS — BP 131/60 | HR 86 | Temp 98.2°F | Resp 18

## 2018-12-05 DIAGNOSIS — C9110 Chronic lymphocytic leukemia of B-cell type not having achieved remission: Secondary | ICD-10-CM

## 2018-12-05 DIAGNOSIS — Z95828 Presence of other vascular implants and grafts: Secondary | ICD-10-CM

## 2018-12-05 DIAGNOSIS — D709 Neutropenia, unspecified: Secondary | ICD-10-CM

## 2018-12-05 DIAGNOSIS — D801 Nonfamilial hypogammaglobulinemia: Secondary | ICD-10-CM

## 2018-12-05 LAB — CBC WITH DIFFERENTIAL/PLATELET
Abs Immature Granulocytes: 0 10*3/uL (ref 0.00–0.07)
Band Neutrophils: 26 %
Basophils Absolute: 0 10*3/uL (ref 0.0–0.1)
Basophils Relative: 0 %
EOS ABS: 0 10*3/uL (ref 0.0–0.5)
Eosinophils Relative: 0 %
HCT: 29.6 % — ABNORMAL LOW (ref 36.0–46.0)
Hemoglobin: 9.5 g/dL — ABNORMAL LOW (ref 12.0–15.0)
Lymphocytes Relative: 11 %
Lymphs Abs: 0.4 10*3/uL — ABNORMAL LOW (ref 0.7–4.0)
MCH: 28.1 pg (ref 26.0–34.0)
MCHC: 32.1 g/dL (ref 30.0–36.0)
MCV: 87.6 fL (ref 80.0–100.0)
MONOS PCT: 2 %
Monocytes Absolute: 0.1 10*3/uL (ref 0.1–1.0)
Neutro Abs: 2.8 10*3/uL (ref 1.7–17.7)
Neutrophils Relative %: 61 %
Platelets: 97 10*3/uL — ABNORMAL LOW (ref 150–400)
RBC: 3.38 MIL/uL — ABNORMAL LOW (ref 3.87–5.11)
RDW: 15.5 % (ref 11.5–15.5)
WBC: 3.2 10*3/uL — ABNORMAL LOW (ref 4.0–10.5)
nRBC: 0.6 % — ABNORMAL HIGH (ref 0.0–0.2)

## 2018-12-05 MED ORDER — TBO-FILGRASTIM 480 MCG/0.8ML ~~LOC~~ SOSY: 480.0000 ug | PREFILLED_SYRINGE | Freq: Once | SUBCUTANEOUS | Status: DC

## 2018-12-05 MED ORDER — DIPHENHYDRAMINE HCL 25 MG PO CAPS
ORAL_CAPSULE | ORAL | Status: AC
  Filled 2018-12-05: qty 1

## 2018-12-05 MED ORDER — TBO-FILGRASTIM 480 MCG/0.8ML ~~LOC~~ SOSY
480.0000 ug | PREFILLED_SYRINGE | Freq: Once | SUBCUTANEOUS | Status: AC
  Administered 2018-12-05: 480 ug via SUBCUTANEOUS

## 2018-12-05 MED ORDER — TBO-FILGRASTIM 480 MCG/0.8ML ~~LOC~~ SOSY
PREFILLED_SYRINGE | SUBCUTANEOUS | Status: AC
  Filled 2018-12-05: qty 0.8

## 2018-12-05 MED ORDER — DEXTROSE 5 % IV SOLN
INTRAVENOUS | Status: DC
  Administered 2018-12-05: 09:00:00 via INTRAVENOUS
  Filled 2018-12-05: qty 250

## 2018-12-05 MED ORDER — HEPARIN SOD (PORK) LOCK FLUSH 100 UNIT/ML IV SOLN
500.0000 [IU] | Freq: Once | INTRAVENOUS | Status: AC
  Administered 2018-12-05: 500 [IU] via INTRAVENOUS
  Filled 2018-12-05: qty 5

## 2018-12-05 MED ORDER — DIPHENHYDRAMINE HCL 25 MG PO TABS
25.0000 mg | ORAL_TABLET | Freq: Once | ORAL | Status: AC
  Administered 2018-12-05: 25 mg via ORAL
  Filled 2018-12-05: qty 1

## 2018-12-05 MED ORDER — STARCH 51 % RE SUPP: 1.0000 | RECTAL | 0 refills | Status: DC | PRN

## 2018-12-05 MED ORDER — ACETAMINOPHEN 325 MG PO TABS
650.0000 mg | ORAL_TABLET | Freq: Once | ORAL | Status: AC
  Administered 2018-12-05: 650 mg via ORAL

## 2018-12-05 MED ORDER — ACETAMINOPHEN 325 MG PO TABS
ORAL_TABLET | ORAL | Status: AC
  Filled 2018-12-05: qty 2

## 2018-12-05 MED ORDER — IMMUNE GLOBULIN (HUMAN) 5 GM/50ML IV SOLN
1.0000 g/kg | Freq: Once | INTRAVENOUS | Status: DC
  Filled 2018-12-05: qty 50

## 2018-12-05 MED ORDER — SODIUM CHLORIDE 0.9% FLUSH
10.0000 mL | Freq: Once | INTRAVENOUS | Status: AC
  Administered 2018-12-05: 10 mL via INTRAVENOUS
  Filled 2018-12-05: qty 10

## 2018-12-05 MED ORDER — HYDROCORTISONE 2.5 % RE CREA: TOPICAL_CREAM | Freq: Three times a day (TID) | RECTAL | 0 refills | Status: DC

## 2018-12-05 NOTE — Patient Instructions (Addendum)
Tbo-Filgrastim injection What is this medicine? TBO-FILGRASTIM (T B O fil GRA stim) is a granulocyte colony-stimulating factor that stimulates the growth of neutrophils, a type of white blood cell important in the body's fight against infection. It is used to reduce the incidence of fever and infection in patients with certain types of cancer who are receiving chemotherapy that affects the bone marrow. This medicine may be used for other purposes; ask your health care provider or pharmacist if you have questions. COMMON BRAND NAME(S): Granix What should I tell my health care provider before I take this medicine? They need to know if you have any of these conditions: -bone scan or tests planned -kidney disease -sickle cell anemia -an unusual or allergic reaction to tbo-filgrastim, filgrastim, pegfilgrastim, other medicines, foods, dyes, or preservatives -pregnant or trying to get pregnant -breast-feeding How should I use this medicine? This medicine is for injection under the skin. If you get this medicine at home, you will be taught how to prepare and give this medicine. Refer to the Instructions for Use that come with your medication packaging. Use exactly as directed. Take your medicine at regular intervals. Do not take your medicine more often than directed. It is important that you put your used needles and syringes in a special sharps container. Do not put them in a trash can. If you do not have a sharps container, call your pharmacist or healthcare provider to get one. Talk to your pediatrician regarding the use of this medicine in children. While this drug may be prescribed for children as young as 1 month of age for selected conditions, precautions do apply. Overdosage: If you think you have taken too much of this medicine contact a poison control center or emergency room at once. NOTE: This medicine is only for you. Do not share this medicine with others. What if I miss a dose? It is  important not to miss your dose. Call your doctor or health care professional if you miss a dose. What may interact with this medicine? This medicine may interact with the following medications: -medicines that may cause a release of neutrophils, such as lithium This list may not describe all possible interactions. Give your health care provider a list of all the medicines, herbs, non-prescription drugs, or dietary supplements you use. Also tell them if you smoke, drink alcohol, or use illegal drugs. Some items may interact with your medicine. What should I watch for while using this medicine? You may need blood work done while you are taking this medicine. What side effects may I notice from receiving this medicine? Side effects that you should report to your doctor or health care professional as soon as possible: -allergic reactions like skin rash, itching or hives, swelling of the face, lips, or tongue -back pain -blood in the urine -dark urine -dizziness -fast heartbeat -feeling faint -shortness of breath or breathing problems -signs and symptoms of infection like fever or chills; cough; or sore throat -signs and symptoms of kidney injury like trouble passing urine or change in the amount of urine -stomach or side pain, or pain at the shoulder -sweating -swelling of the legs, ankles, or abdomen -tiredness Side effects that usually do not require medical attention (report to your doctor or health care professional if they continue or are bothersome): -bone pain -diarrhea -headache -muscle pain -vomiting This list may not describe all possible side effects. Call your doctor for medical advice about side effects. You may report side effects to FDA at   1-800-FDA-1088. Where should I keep my medicine? Keep out of the reach of children. Store in a refrigerator between 2 and 8 degrees C (36 and 46 degrees F). Keep in carton to protect from light. Throw away this medicine if it is left out  of the refrigerator for more than 5 consecutive days. Throw away any unused medicine after the expiration date. NOTE: This sheet is a summary. It may not cover all possible information. If you have questions about this medicine, talk to your doctor, pharmacist, or health care provider.  2019 Elsevier/Gold Standard (2017-04-16 16:56:18)  

## 2018-12-05 NOTE — Progress Notes (Signed)
[place]  [phone] [phone]    ID: Maria [name]   DOB: 12/25/1950  MR#: [id number]  XBL#:[id number]  Patient Care Team: Marton Redwood, MD as PCP - General (Internal Medicine) [name], MD as Consulting Physician (Orthopedic Surgery) , [name] Dad, MD as Consulting Physician (Oncology)   CHIEF COMPLAINT: Small lymphocytic lymphoma  CURRENT TREATMENT: CHOP/obinutuzumab   HISTORY OF PRESENT ILLNESS: From the original intake note:  Maria [name] developed what she thought was "the flu" in December of 2002. She noted a large lymph node developing in her right anterior cervical area. 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   Her subsequent history is as detailed below.   INTERVAL HISTORY: Maria [name] returns today for follow-up and treatment of her chronic lymphoid leukemia.  She required admission for confusion, severe dehydration, multiple electrolyte abnormalities, and severe cytopenias.  She was hydrated, received a dose of IVIG for her severe hypogammaglobulinemia, had her electrolyte abnormalities corrected, and received 1 unit of packed red cells.  Her appointment today was to have been canceled but for some reason it was not.  Since she is here we repeated a CBC which shows her platelets are now getting close to 100,000, she continues leukopenic, and she will receive a Granix dose today.  REVIEW OF SYSTEMS: Maria [name] has had no further problems with confusion.  She denies fever, rash, cough, shortness of breath, diarrhea, dysuria, headache, nausea, vomiting, balance problems, falls, or any other new symptoms.  A detailed review of systems was otherwise stable.   PAST MEDICAL HISTORY:     Past Medical History:  Diagnosis Date  . Allergy   . Arthritis   . Asthma   . Cancer [facility])    chronic lymphacytic leukemia  . CLL  (chronic lymphocytic leukemia) ([place])   . Diabetes mellitus    PMH  . Headache    migraine  . Hyperlipidemia   . Hypertension   . Hypothyroidism   . Lymphoma ([place])   . Thyroid disease    hypothyroidism  . Wears glasses     PAST SURGICAL HISTORY: Past Surgical History:  Procedure Laterality Date  . ABDOMINAL HYSTERECTOMY    . AXILLARY LYMPH NODE BIOPSY Right 08/21/2018   Procedure: RIGHT AXILLARY LYMPH NODE BIOPSY ERAS PATHWAY;  Surgeon: [name], MD;  Location: [facility];  Service: General;  Laterality: Right;  LMA  . BREAST EXCISIONAL BIOPSY Right   . COLONOSCOPY    . IR IMAGING GUIDED PORT INSERTION  11/10/2018  . LYMPH NODE DISSECTION    . STRABISMUS SURGERY    . TONSILLECTOMY    . TOTAL KNEE ARTHROPLASTY Right 12/03/2016   Procedure: RIGHT TOTAL KNEE ARTHROPLASTY;  Surgeon: [name], MD;  Location: [facility];  Service: Orthopedics;  Laterality: Right;    FAMILY HISTORY:      Family History  Problem Relation Age of Onset  . Liver cancer Mother   . Heart disease Father   . Breast cancer Sister 70  . Colon cancer Neg Hx   . Pancreatic cancer Neg Hx   . Rectal cancer Neg Hx   . Stomach cancer Neg Hx    The patient's father died at the age of 14 from a myocardial infarction. The patient's mother died at the age of 75 from primary liver carcinoma. The patient had no brothers. Her one sister, Maria [name], has a history of breast cancer   GYNECOLOGIC HISTORY:  Menarche at around age 63. The patient is GX P0. She underwent simple hysterectomy without salpingo-oophorectomy in 1997    SOCIAL HISTORY: (Updated November 2018) She reports that she recently retired from being a Therapist, sports at [employer], working in the [employer], [place] x 5d/week. She lives by herself with her cats [place] and [place]. She attends a Investment banker, operational.              ADVANCED DIRECTIVES: Not in place   HEALTH MAINTENANCE: Social History        Tobacco Use  .  Smoking status: Never Smoker  . Smokeless tobacco: Never Used  Substance Use Topics  . Alcohol use: Yes    Comment: occasional alcohol intake; once or twice monthly  . Drug use: No                Colonoscopy: 06/01/2014/[facility]             PAP:             Bone density:             Lipid panel:  228/114/50/155/ratio 4.6  On 10/01/2016       Allergies  Allergen Reactions  . Iohexol Swelling and Rash  . Compazine [Prochlorperazine Edisylate] Other (See Comments)    Mild confusion  . Contrast Media  [Iodinated Diagnostic Agents]   . Lactose Intolerance (Gi)     Diarrhea, gas bloating  . Lactulose Diarrhea and Other (See Comments)    gas bloating  . Cephalexin Rash          Current Outpatient Medications  Medication Sig Dispense Refill  . acetaminophen (TYLENOL 8 HOUR ARTHRITIS PAIN) 650 MG CR tablet Take 650 mg by mouth every 8 (eight) hours as needed for pain.    Maria [name] Kitchen acyclovir (ZOVIRAX) 200 MG capsule Take 200 mg by mouth 5 (five) times daily.    Maria [name] Kitchen allopurinol (ZYLOPRIM) 300 MG tablet Take 1 tablet (300 mg total) by mouth daily. 90 tablet 4  . furosemide (LASIX) 20 MG tablet TAKE 1 TABLET BY MOUTH EVERY DAY 30 tablet 0  . hydrocortisone (ANUSOL-HC) 2.5 % rectal cream Place rectally 3 (three) times daily. 30 g 0  . KLOR-CON M20 20 MEQ tablet TAKE 1 TABLET BY MOUTH EVERY DAY 30 tablet 0  . lidocaine-prilocaine (EMLA) cream Apply to affected area once 30 g 3  . LORazepam (ATIVAN) 0.5 MG tablet Take 1 tablet (0.5 mg total) by mouth at bedtime as needed (Nausea or vomiting). 30 tablet 0  . ondansetron (ZOFRAN) 8 MG tablet Take 1 tablet (8 mg total) by mouth every 8 (eight) hours as needed for nausea or vomiting. 20 tablet 3  . oxyCODONE-acetaminophen (PERCOCET/ROXICET) 5-325 MG tablet Take 1-2 tablets by mouth every 6 (six) hours as needed for severe pain. 30 tablet 0  . predniSONE (DELTASONE) 20 MG tablet Take 3 tablets (60 mg total) by mouth daily with breakfast.  Take days 1-5 starting on CHOP chemo day 15 tablet 6  . rosuvastatin (CRESTOR) 10 MG tablet Take 10 mg by mouth at bedtime.  11  . starch (ANUSOL) 51 % suppository Place 1 suppository rectally as needed for pain. 24 suppository 0  . SYNTHROID 88 MCG tablet Take 88 mcg by mouth daily before breakfast.     . zoledronic acid (RECLAST) 5 MG/100ML SOLN injection Inject 5 mg into the vein once. Once a year     No current facility-administered medications for this visit.  Facility-Administered Medications Ordered in Other Visits  Medication Dose Route Frequency Provider Last Rate Last Dose  . dextrose 5 % solution   Intravenous Continuous , [name] Dad, MD 10 mL/hr at 12/05/18 0845    . Immune Globulin 10% (PRIVIGEN) IV infusion 85 g  1 g/kg Intravenous Once , [name] Dad, MD        OBJECTIVE: Middle-aged white woman who appears stated age For today's vitals please consult the chemotherapy treatment area flowsheet There were no vitals filed for this visit.   There is no height or weight on file to calculate BMI.      ECOG FS: 2 - Symptomatic, <50% confined to bed  LAB RESULTS:  RecentLabs       Lab Results  Component Value Date   WBC 3.2 (L) 12/05/2018   NEUTROABS 2.8 12/05/2018   HGB 9.5 (L) 12/05/2018   HCT 29.6 (L) 12/05/2018   MCV 87.6 12/05/2018   PLT 97 (L) 12/05/2018        Chemistry   Labs(Brief)          Component Value Date/Time   NA 131 (L) 12/03/2018 0622   NA 139 08/05/2017 1353   K 4.4 12/03/2018 0622   K 3.7 08/05/2017 1353   CL 105 12/03/2018 0622   CO2 22 12/03/2018 0622   CO2 27 08/05/2017 1353   BUN 26 (H) 12/03/2018 0622   BUN 17.3 08/05/2017 1353   CREATININE 0.83 12/03/2018 0622   CREATININE 0.90 11/24/2018 1107   CREATININE 1.0 08/05/2017 1353   GLU 101 (H) 10/18/2009 1542     Labs(Brief)          Component Value Date/Time   CALCIUM 6.8 (L) 12/03/2018 0622   CALCIUM 9.2 08/05/2017  1353   ALKPHOS 65 12/03/2018 0622   ALKPHOS 76 08/05/2017 1353   AST 12 (L) 12/03/2018 0622   AST <6 (L) 11/24/2018 1107   AST 18 08/05/2017 1353   ALT 11 12/03/2018 0622   ALT 9 11/24/2018 1107   ALT 14 08/05/2017 1353   BILITOT 0.7 12/03/2018 0622   BILITOT 1.2 11/24/2018 1107   BILITOT 0.59 08/05/2017 1353         RecentLabs  No results found for: LABCA2    RecentLabs  No components found for: LABCA125    LastLabs  No results for input(s): INR in the last 168 hours.    Urinalysis Labs(Brief)          Component Value Date/Time   COLORURINE LT. YELLOW 01/24/2010 1017   APPEARANCEUR CLEAR 01/24/2010 1017   LABSPEC <=1.005 01/24/2010 1017   PHURINE 5.5 01/24/2010 1017   GLUCOSEU NEGATIVE 01/24/2010 [place] 01/24/2010 1017   KETONESUR NEGATIVE 01/24/2010 1017   UROBILINOGEN 0.2 01/24/2010 1017   NITRITE NEGATIVE 01/24/2010 1017   LEUKOCYTESUR NEGATIVE 01/24/2010 1017      STUDIES:  ImagingResults  Ir Imaging Guided Port Insertion  Result Date: 11/10/2018 INDICATION: 68 year old female with chronic lymphocytic leukemia in need of durable venous access for chemotherapy. EXAM: IMPLANTED PORT A CATH PLACEMENT WITH ULTRASOUND AND FLUOROSCOPIC GUIDANCE MEDICATIONS: 900 mg clindamycin; The antibiotic was administered within an appropriate time interval prior to skin puncture. ANESTHESIA/SEDATION: Versed 2 mg IV; Fentanyl 100 mcg IV; Moderate Sedation Time:  21 minutes The patient was continuously monitored during the procedure by the interventional radiology nurse under my direct supervision. FLUOROSCOPY TIME:  0 minutes, 18 seconds (3 mGy) COMPLICATIONS: None immediate. PROCEDURE: The right neck and chest was  prepped with chlorhexidine, and draped in the usual sterile fashion using maximum barrier technique (cap and mask, sterile gown, sterile gloves, large sterile sheet, hand hygiene and cutaneous antiseptic). Local  anesthesia was attained by infiltration with 1% lidocaine with epinephrine. Ultrasound demonstrated patency of the right internal jugular vein, and this was documented with an image. Under real-time ultrasound guidance, this vein was accessed with a 21 gauge micropuncture needle and image documentation was performed. A small dermatotomy was made at the access site with an 11 scalpel. A 0.018" wire was advanced into the SVC and the access needle exchanged for a 30F micropuncture vascular sheath. The 0.018" wire was then removed and a 0.035" wire advanced into the IVC. An appropriate location for the subcutaneous reservoir was selected below the clavicle and an incision was made through the skin and underlying soft tissues. The subcutaneous tissues were then dissected using a combination of blunt and sharp surgical technique and a pocket was formed. A single lumen power injectable portacatheter was then tunneled through the subcutaneous tissues from the pocket to the dermatotomy and the port reservoir placed within the subcutaneous pocket. The venous access site was then serially dilated and a peel away vascular sheath placed over the wire. The wire was removed and the port catheter advanced into position under fluoroscopic guidance. The catheter tip is positioned in the superior cavoatrial junction. This was documented with a spot image. The portacatheter was then tested and found to flush and aspirate well. The port was flushed with saline followed by 100 units/mL heparinized saline. The pocket was then closed in two layers using first subdermal inverted interrupted absorbable sutures followed by a running subcuticular suture. The epidermis was then sealed with Dermabond. The dermatotomy at the venous access site was also closed with Dermabond. IMPRESSION: Successful placement of a right IJ approach Power Port with ultrasound and fluoroscopic guidance. The catheter is ready for use. Electronically Signed   By: Jacqulynn Cadet M.D.   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             (b) rituximab added to ibrutinib, first dose 08/27/2018             (c) hepatitis B studies 08/27/2018 negative             (f) ibrutinib/rituximab discontinued after 10/02/2018 dose w/o obvious response  (6) bendamustine/rituximab started 10/22/2018, discontinued after 1 cycle with rapid progression  (7) obinutuzumab/Gaziva started 11/11/2018  (8) CHOP/obinutuzumab  chemotherapy started 11/19/2018             (a) echocardiogram on 11/07/2018 shows EF of 55-60%   PLAN:  Eyvette had a good response to her first CHOP/obinutuzumab treatment, with significant decrease in her visible adenopathy.  "I now have a neck.".  However she had profound cytopenias and if and when we repeat this treatment we will have to cut the CHOP dose in half.  She also has severe hypogammaglobulinemia and is at high risk if she becomes exposed or develops an infection with the novel coronavirus.  We are  giving her Granix shot today to make sure her neutrophils at least improve.  She will return 12/17/2018 and on that date she will receive IVIG, possibly also obinutuzumab, possibly decreased dose CHOP.  She is very aware of the possible symptoms that would make her call us but we reinforced today that if any fever develops she needs to call us immediately  , [name] Dad, MD  12/05/18 11:16 AM Medical Oncology and Hematology [facility] [place], [place] [address] Tel. [phone] Fax. [phone]  I, Jacqualyn Posey am acting as a Education administrator for Chauncey Cruel, MD.   I, Lurline Del MD, have reviewed the above documentation for accuracy and completeness, and I agree with the above.

## 2018-12-05 NOTE — Progress Notes (Signed)
[place]  [phone] [phone]) [phone]    ID: Maria [name]   DOB: 1951-08-27  MR#: [id number]  PNT#:[id number]  Patient Care Team: Marton Redwood, MD as PCP - General (Internal Medicine) [name], MD as Consulting Physician (Orthopedic Surgery) Elenor Wildes, [name] Dad, MD as Consulting Physician (Oncology)   CHIEF COMPLAINT: Small lymphocytic lymphoma  CURRENT TREATMENT: CHOP/obinutuzumab   HISTORY OF PRESENT ILLNESS: From the original intake note:  Maria [name] developed what she thought was "the flu" in December of 2002. She noted a large lymph node developing in her right anterior cervical area. 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   Her subsequent history is as detailed below.   INTERVAL HISTORY: Maria [name] returns today for follow-up and treatment of her chronic lymphoid leukemia.  She required admission for confusion, severe dehydration, multiple electrolyte abnormalities, and severe cytopenias.  She was hydrated, received a dose of IVIG for her severe hypogammaglobulinemia, had her electrolyte abnormalities corrected, and received 1 unit of packed red cells.  Her appointment today was to have been canceled but for some reason it was not.  Since she is here we repeated a CBC which shows her platelets are now getting close to 100,000, she continues leukopenic, and she will receive a Granix dose today.  REVIEW OF SYSTEMS: Ritamarie has had no further problems with confusion.  She denies fever, rash, cough, shortness of breath, diarrhea, dysuria, headache, nausea, vomiting, balance problems, falls, or any other new symptoms.  A detailed review of systems was otherwise stable.   PAST MEDICAL HISTORY: Past Medical History:  Diagnosis Date  . Allergy   . Arthritis   . Asthma   . Cancer [facility])    chronic lymphacytic leukemia  . CLL (chronic lymphocytic leukemia)  ([place])   . Diabetes mellitus    PMH  . Headache    migraine  . Hyperlipidemia   . Hypertension   . Hypothyroidism   . Lymphoma ([place])   . Thyroid disease    hypothyroidism  . Wears glasses     PAST SURGICAL HISTORY: Past Surgical History:  Procedure Laterality Date  . ABDOMINAL HYSTERECTOMY    . AXILLARY LYMPH NODE BIOPSY Right 08/21/2018   Procedure: RIGHT AXILLARY LYMPH NODE BIOPSY ERAS PATHWAY;  Surgeon: [name], MD;  Location: [place];  Service: General;  Laterality: Right;  LMA  . BREAST EXCISIONAL BIOPSY Right   . COLONOSCOPY    . IR IMAGING GUIDED PORT INSERTION  11/10/2018  . LYMPH NODE DISSECTION    . STRABISMUS SURGERY    . TONSILLECTOMY    . TOTAL KNEE ARTHROPLASTY Right 12/03/2016   Procedure: RIGHT TOTAL KNEE ARTHROPLASTY;  Surgeon: [name], MD;  Location: [facility];  Service: Orthopedics;  Laterality: Right;    FAMILY HISTORY: Family History  Problem Relation Age of Onset  . Liver cancer Mother   . Heart disease Father   . Breast cancer Sister 67  . Colon cancer Neg Hx   . Pancreatic cancer Neg Hx   . Rectal cancer Neg Hx   . Stomach cancer Neg Hx    The patient's father died at the age of 40 from a myocardial infarction. The patient's mother died at the age of 66 from primary liver carcinoma. The patient had no brothers. Her one sister, Maria [name], has a history of breast cancer   GYNECOLOGIC HISTORY: Menarche at around age 68. The patient is [place]  P0. 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 She lives by herself with her cats [place] and [place]. She attends a Investment banker, operational.   ADVANCED DIRECTIVES: Not in place   HEALTH MAINTENANCE: Social History   Tobacco Use  . Smoking status: Never Smoker  . Smokeless tobacco: Never Used  Substance Use Topics  . Alcohol use:  Yes    Comment: occasional alcohol intake; once or twice monthly  . Drug use: No     Colonoscopy: 06/01/2014/[facility]  PAP:  Bone density:  Lipid panel:  228/114/50/155/ratio 4.6  On 10/01/2016  Allergies  Allergen Reactions  . Iohexol Swelling and Rash  . Compazine [Prochlorperazine Edisylate] Other (See Comments)    Mild confusion  . Contrast Media  [Iodinated Diagnostic Agents]   . Lactose Intolerance (Gi)     Diarrhea, gas bloating  . Lactulose Diarrhea and Other (See Comments)    gas bloating  . Cephalexin Rash    Current Outpatient Medications  Medication Sig Dispense Refill  . acetaminophen (TYLENOL 8 HOUR ARTHRITIS PAIN) 650 MG CR tablet Take 650 mg by mouth every 8 (eight) hours as needed for pain.    Marland Kitchen acyclovir (ZOVIRAX) 200 MG capsule Take 200 mg by mouth 5 (five) times daily.    Marland Kitchen allopurinol (ZYLOPRIM) 300 MG tablet Take 1 tablet (300 mg total) by mouth daily. 90 tablet 4  . furosemide (LASIX) 20 MG tablet TAKE 1 TABLET BY MOUTH EVERY DAY 30 tablet 0  . hydrocortisone (ANUSOL-HC) 2.5 % rectal cream Place rectally 3 (three) times daily. 30 g 0  . KLOR-CON M20 20 MEQ tablet TAKE 1 TABLET BY MOUTH EVERY DAY 30 tablet 0  . lidocaine-prilocaine (EMLA) cream Apply to affected area once 30 g 3  . LORazepam (ATIVAN) 0.5 MG tablet Take 1 tablet (0.5 mg total) by mouth at bedtime as needed (Nausea or vomiting). 30 tablet 0  . ondansetron (ZOFRAN) 8 MG tablet Take 1 tablet (8 mg total) by mouth every 8 (eight) hours as needed for nausea or vomiting. 20 tablet 3  . oxyCODONE-acetaminophen (PERCOCET/ROXICET) 5-325 MG tablet Take 1-2 tablets by mouth every 6 (six) hours as needed for severe pain. 30 tablet 0  . predniSONE (DELTASONE) 20 MG tablet Take 3 tablets (60 mg total) by mouth daily with breakfast. Take days 1-5 starting on CHOP chemo day 15 tablet 6  . rosuvastatin (CRESTOR) 10 MG tablet Take 10 mg by mouth at bedtime.  11  . starch (ANUSOL) 51 % suppository Place 1  suppository rectally as needed for pain. 24 suppository 0  . SYNTHROID 88 MCG tablet Take 88 mcg by mouth daily before breakfast.     . zoledronic acid (RECLAST) 5 MG/100ML SOLN injection Inject 5 mg into the vein once. Once a year     No current facility-administered medications for this visit.    Facility-Administered Medications Ordered in Other Visits  Medication Dose Route Frequency Provider Last Rate Last Dose  . dextrose 5 % solution   Intravenous Continuous Eilish Mcdaniel, [name] Dad, MD 10 mL/hr at 12/05/18 0845    . Immune Globulin 10% (PRIVIGEN) IV infusion 85 g  1 g/kg Intravenous Once Dala Breault, [name] Dad, MD        OBJECTIVE: Middle-aged white woman who appears stated age For today's vitals please consult the chemotherapy treatment area flowsheet  There were no vitals filed for this visit.   There is no height or weight on file to calculate BMI.      ECOG FS: 2 - Symptomatic, <50% confined to bed  LAB RESULTS:  Lab Results  Component Value Date   WBC 3.2 (L) 12/05/2018   NEUTROABS 2.8 12/05/2018   HGB 9.5 (L) 12/05/2018   HCT 29.6 (L) 12/05/2018   MCV 87.6 12/05/2018   PLT 97 (L) 12/05/2018      Chemistry      Component Value Date/Time   NA 131 (L) 12/03/2018 0622   NA 139 08/05/2017 1353   K 4.4 12/03/2018 0622   K 3.7 08/05/2017 1353   CL 105 12/03/2018 0622   CO2 22 12/03/2018 0622   CO2 27 08/05/2017 1353   BUN 26 (H) 12/03/2018 0622   BUN 17.3 08/05/2017 1353   CREATININE 0.83 12/03/2018 0622   CREATININE 0.90 11/24/2018 1107   CREATININE 1.0 08/05/2017 1353   GLU 101 (H) 10/18/2009 1542      Component Value Date/Time   CALCIUM 6.8 (L) 12/03/2018 0622   CALCIUM 9.2 08/05/2017 1353   ALKPHOS 65 12/03/2018 0622   ALKPHOS 76 08/05/2017 1353   AST 12 (L) 12/03/2018 0622   AST <6 (L) 11/24/2018 1107   AST 18 08/05/2017 1353   ALT 11 12/03/2018 0622   ALT 9 11/24/2018 1107   ALT 14 08/05/2017 1353   BILITOT 0.7 12/03/2018 0622   BILITOT 1.2 11/24/2018 1107    BILITOT 0.59 08/05/2017 1353       No results found for: LABCA2  No components found for: LABCA125  No results for input(s): INR in the last 168 hours.  Urinalysis    Component Value Date/Time   COLORURINE LT. YELLOW 01/24/2010 1017   APPEARANCEUR CLEAR 01/24/2010 1017   LABSPEC <=1.005 01/24/2010 1017   PHURINE 5.5 01/24/2010 1017   GLUCOSEU NEGATIVE 01/24/2010 [place] 01/24/2010 1017   KETONESUR NEGATIVE 01/24/2010 1017   UROBILINOGEN 0.2 01/24/2010 1017   NITRITE NEGATIVE 01/24/2010 1017   LEUKOCYTESUR NEGATIVE 01/24/2010 1017    STUDIES: Ir Imaging Guided Port Insertion  Result Date: 11/10/2018 INDICATION: 68 year old female with chronic lymphocytic leukemia in need of durable venous access for chemotherapy. EXAM: IMPLANTED PORT A CATH PLACEMENT WITH ULTRASOUND AND FLUOROSCOPIC GUIDANCE MEDICATIONS: 900 mg clindamycin; The antibiotic was administered within an appropriate time interval prior to skin puncture. ANESTHESIA/SEDATION: Versed 2 mg IV; Fentanyl 100 mcg IV; Moderate Sedation Time:  21 minutes The patient was continuously monitored during the procedure by the interventional radiology nurse under my direct supervision. FLUOROSCOPY TIME:  0 minutes, 18 seconds (3 mGy) COMPLICATIONS: None immediate. PROCEDURE: The right neck and chest was prepped with chlorhexidine, and draped in the usual sterile fashion using maximum barrier technique (cap and mask, sterile gown, sterile gloves, large sterile sheet, hand hygiene and cutaneous antiseptic). Local anesthesia was attained by infiltration with 1% lidocaine with epinephrine. Ultrasound demonstrated patency of the right internal jugular vein, and this was documented with an image. Under real-time ultrasound guidance, this vein was accessed with a 21 gauge micropuncture needle and image documentation was performed. A small dermatotomy was made at the access site with an 11 scalpel. A 0.018" wire was advanced into  the SVC and the access needle exchanged for a 51F micropuncture vascular sheath. The 0.018" wire was then removed and a 0.035" wire advanced into the IVC. An appropriate location for the subcutaneous reservoir was selected below  the clavicle and an incision was made through the skin and underlying soft tissues. The subcutaneous tissues were then dissected using a combination of blunt and sharp surgical technique and a pocket was formed. A single lumen power injectable portacatheter was then tunneled through the subcutaneous tissues from the pocket to the dermatotomy and the port reservoir placed within the subcutaneous pocket. The venous access site was then serially dilated and a peel away vascular sheath placed over the wire. The wire was removed and the port catheter advanced into position under fluoroscopic guidance. The catheter tip is positioned in the superior cavoatrial junction. This was documented with a spot image. The portacatheter was then tested and found to flush and aspirate well. The port was flushed with saline followed by 100 units/mL heparinized saline. The pocket was then closed in two layers using first subdermal inverted interrupted absorbable sutures followed by a running subcuticular suture. The epidermis was then sealed with Dermabond. The dermatotomy at the venous access site was also closed with Dermabond. IMPRESSION: Successful placement of a right IJ approach Power Port with ultrasound and fluoroscopic guidance. The catheter is ready for use. Electronically Signed   By: Jacqulynn Cadet M.D.   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  "I now have a neck.".  However she had profound cytopenias and if and when we repeat this treatment we will have to cut the CHOP dose in half.  She also has severe hypogammaglobulinemia and is at high risk if she becomes exposed or develops an infection with the novel coronavirus.  We are giving her Granix shot today to make sure her neutrophils at least improve.  She will return 12/17/2018 and on that date she will receive IVIG, possibly also obinutuzumab, possibly decreased dose CHOP.  She is very aware of the possible symptoms that would make her call us but we reinforced today that if any fever develops she needs to call us immediately  Maria Nickson, [name] Dad, MD  12/05/18 11:16 AM Medical Oncology and Hematology [facility] [place], [place] [address]  Tel. [phone]    Fax. [phone]  I, Jacqualyn Posey am acting as a Education administrator for Chauncey Cruel, MD.  I, Lurline Del MD, have reviewed the above documentation for accuracy and completeness, and I agree with the above.

## 2018-12-06 ENCOUNTER — Other Ambulatory Visit: Payer: Self-pay | Admitting: Oncology

## 2018-12-15 NOTE — Progress Notes (Signed)
[place]  [phone] Fax:(336) [phone]    ID: NAN MAYA   DOB: 08-26-1951  MR#: [id number]  ENI#:[id number]  Patient Care Team: Marton Redwood, MD as PCP - General (Internal Medicine) [name], MD as Consulting Physician (Orthopedic Surgery) [name], [name] Dad, MD as Consulting Physician (Oncology)   CHIEF COMPLAINT: Small lymphocytic lymphoma  CURRENT TREATMENT: [CHOP]/[obinutuzumab]; IVIG   INTERVAL HISTORY: Maria [name] returns today for follow-up and treatment of her chronic lymphoid leukemia/small cell lymphocytic lymphoma.   To review, she was started on obinutuzumab 11/12/2018.  This brought her white cell count from 42,000-2.4 and her platelet count from 167,000-52,000, with no chemotherapy.  On 11/19/2018 she received a second dose of obinutuzumab together with CHOP chemotherapy (with the vincristine omitted because of neuropathy concerns.)  This resulted in very severe cytopenias.  She also received IVIG on 12/02/2018 because of her severe hypogammaglobulinemia.  She is here today for further treatment.  Of course our concern is that at the time of this pandemic she is at extremely high risk of dying if she does become infected.  At the same time we do want to continue her treatments and she was experiencing a rapidly progressive lymphoma.  Accordingly we are holding her chemotherapy and her immunotherapy and proceeding only with IVIG   REVIEW OF SYSTEMS: Maria [name] was found to have a fever at screening today.  She was not aware that she had a temperature.  It is mild, 100.2.  She has had no unusual headaches, no visual changes, no nausea or vomiting, no cough, no shortness of breath, no pleurisy, and no change in bowel habits.  If anything she has been slightly constipated.  On the other hand she has been extra tired she says for the last couple of days and has not felt like eating very much for the last couple of days mostly  she has had soups and liquids.  There has been no rash.  She feels her lymph nodes are clearly responding to treatment.  A detailed review of systems today was otherwise noncontributory  HISTORY OF PRESENT ILLNESS: From the original intake note:  Maria [name] developed what she thought was "the flu" in December of 2002. She noted a large lymph node developing in her right anterior cervical area. 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   Her subsequent history is as detailed above.   PAST MEDICAL HISTORY: Past Medical History:  Diagnosis Date  . Allergy   . Arthritis   . Asthma   . Cancer [facility])    chronic lymphacytic leukemia  . CLL (chronic lymphocytic leukemia) ([place])   . Diabetes mellitus    PMH  . Headache    migraine  . Hyperlipidemia   . Hypertension   . Hypothyroidism   . Lymphoma ([place])   . Thyroid disease    hypothyroidism  . Wears glasses     PAST SURGICAL HISTORY: Past Surgical History:  Procedure Laterality Date  . ABDOMINAL HYSTERECTOMY    . AXILLARY LYMPH NODE BIOPSY Right 08/21/2018   Procedure: RIGHT AXILLARY LYMPH NODE BIOPSY ERAS PATHWAY;  Surgeon: [name], MD;  Location: [place];  Service: General;  Laterality: Right;  LMA  . BREAST EXCISIONAL BIOPSY Right   . COLONOSCOPY    . IR IMAGING GUIDED PORT INSERTION  11/10/2018  . LYMPH NODE DISSECTION    . STRABISMUS SURGERY    . TONSILLECTOMY    .  TOTAL KNEE ARTHROPLASTY Right 12/03/2016   Procedure: RIGHT TOTAL KNEE ARTHROPLASTY;  Surgeon: [name], MD;  Location: [facility];  Service: Orthopedics;  Laterality: Right;    FAMILY HISTORY: Family History  Problem Relation Age of Onset  . Liver cancer Mother   . Heart disease Father   . Breast cancer Sister 59  . Colon cancer Neg Hx   . Pancreatic cancer Neg Hx   . Rectal cancer Neg Hx   . Stomach cancer Neg Hx   The patient's father died at the age of 72 from a  myocardial infarction. The patient's mother died at the age of 47 from primary liver carcinoma. The patient had no brothers. Her one sister, Maria [name], has a history of breast cancer   GYNECOLOGIC HISTORY: Menarche at around age 14. The patient is GX P0. 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 She lives by herself with her cats [place] and [place]. She attends a Investment banker, operational.              ADVANCED DIRECTIVES: Not in place   HEALTH MAINTENANCE: Social History   Socioeconomic History  . Marital status: Single    Spouse name: Not on file  . Number of children: Not on file  . Years of education: Not on file  . Highest education level: Not on file  Occupational History  . Not on file  Social Needs  . Financial resource strain: Not hard at all  . Food insecurity:    Worry: Never true    Inability: Never true  . Transportation needs:    Medical: No    Non-medical: No  Tobacco Use  . Smoking status: Never Smoker  . Smokeless tobacco: Never Used  Substance and Sexual Activity  . Alcohol use: Yes    Comment: occasional alcohol intake; once or twice monthly  . Drug use: No  . Sexual activity: Not on file  Lifestyle  . Physical activity:    Days per week: 0 days    Minutes per session: 0 min  . Stress: Not at all  Relationships  . Social connections:    Talks on phone: Once a week    Gets together: Once a week    Attends religious service: Never    Active member of club or organization: No    Attends meetings of clubs or organizations: Never    Relationship status: Never married  . Intimate partner violence:    Fear of current or ex partner: No    Emotionally abused: No    Physically abused: No    Forced sexual activity: No  Other Topics Concern  . Not on file  Social History  Narrative  . Not on file                Colonoscopy: 06/01/2014/[facility]             PAP:             Bone density:             Lipid panel:  228/114/50/155/ratio 4.6  On 10/01/2016  ALLERGIES: Allergies  Allergen Reactions  . Iohexol Swelling and Rash  . Compazine [Prochlorperazine Edisylate] Other (See Comments)    Mild confusion  . Contrast Media  [Iodinated Diagnostic Agents]   . Lactose Intolerance (Gi)  Diarrhea, gas bloating  . Lactulose Diarrhea and Other (See Comments)    gas bloating  . Cephalexin Rash    CURRENT MEDICATIONS: Current Outpatient Medications  Medication Sig Dispense Refill  . acetaminophen (TYLENOL 8 HOUR ARTHRITIS PAIN) 650 MG CR tablet Take 650 mg by mouth every 8 (eight) hours as needed for pain.    Marland Kitchen acyclovir (ZOVIRAX) 200 MG capsule Take 200 mg by mouth 5 (five) times daily.    Marland Kitchen allopurinol (ZYLOPRIM) 300 MG tablet Take 1 tablet (300 mg total) by mouth daily. 90 tablet 4  . furosemide (LASIX) 20 MG tablet TAKE 1 TABLET BY MOUTH EVERY DAY 30 tablet 0  . hydrocortisone (ANUSOL-HC) 2.5 % rectal cream Place rectally 3 (three) times daily. 30 g 0  . KLOR-CON M20 20 MEQ tablet TAKE 1 TABLET BY MOUTH EVERY DAY 90 tablet 1  . lidocaine-prilocaine (EMLA) cream Apply to affected area once 30 g 3  . LORazepam (ATIVAN) 0.5 MG tablet Take 1 tablet (0.5 mg total) by mouth at bedtime as needed (Nausea or vomiting). 30 tablet 0  . ondansetron (ZOFRAN) 8 MG tablet Take 1 tablet (8 mg total) by mouth every 8 (eight) hours as needed for nausea or vomiting. 20 tablet 3  . oxyCODONE-acetaminophen (PERCOCET/ROXICET) 5-325 MG tablet Take 1-2 tablets by mouth every 6 (six) hours as needed for severe pain. 30 tablet 0  . predniSONE (DELTASONE) 20 MG tablet Take 3 tablets (60 mg total) by mouth daily with breakfast. Take days 1-5 starting on CHOP chemo day 15 tablet 6  . rosuvastatin (CRESTOR) 10 MG tablet Take 10 mg by mouth at bedtime.  11  . starch (ANUSOL) 51 %  suppository Place 1 suppository rectally as needed for pain. 24 suppository 0  . SYNTHROID 88 MCG tablet Take 88 mcg by mouth daily before breakfast.     . zoledronic acid (RECLAST) 5 MG/100ML SOLN injection Inject 5 mg into the vein once. Once a year     No current facility-administered medications for this visit.    Facility-Administered Medications Ordered in Other Visits  Medication Dose Route Frequency Provider Last Rate Last Dose  . Immune Globulin 10% (PRIVIGEN) IV infusion 1 g/kg  1 g/kg Intravenous Once [name], [name] Dad, MD        OBJECTIVE:  For today's vitals please see the infusion area flowsheet  Sclerae unicteric, EOMs intact Patient is appropriately wearing a mask The cervical adenopathy is no longer grossly apparent.  There is a small palpable right cervical lymph node at the base, but compared to prior this is a significant reduction Lungs no rales or rhonchi Heart regular rate and rhythm Abd soft, nontender, positive bowel sounds Neuro: nonfocal, well oriented, appropriate affect Breasts: Deferred     ECOG FS: 2 - Symptomatic, <50% confined to bed  LAB RESULTS: Lab Results  Component Value Date   WBC 5.6 12/17/2018   NEUTROABS PENDING 12/17/2018   HGB 10.4 (L) 12/17/2018   HCT 32.3 (L) 12/17/2018   MCV 89.7 12/17/2018   PLT 55 (L) 12/17/2018   CMP Latest Ref Rng & Units 12/03/2018 12/02/2018 12/01/2018  Glucose 70 - 99 mg/dL 209(H) 236(H) 179(H)  BUN 8 - 23 mg/dL 26(H) 34(H) 31(H)  Creatinine 0.44 - 1.00 mg/dL 0.83 1.15(H) 1.11(H)  Sodium 135 - 145 mmol/L 131(L) 131(L) 129(L)  Potassium 3.5 - 5.1 mmol/L 4.4 3.3(L) 3.2(L)  Chloride 98 - 111 mmol/L 105 100 96(L)  CO2 22 - 32 mmol/L 22 23 22  Calcium 8.9 - 10.3 mg/dL 6.8(L) 7.1(L) 7.7(L)  Total Protein 6.5 - 8.1 g/dL 5.4(L) - 5.3(L)  Total Bilirubin 0.3 - 1.2 mg/dL 0.7 - 2.2(H)  Alkaline Phos 38 - 126 U/L 65 - 112  AST 15 - 41 U/L 12(L) - 17  ALT 0 - 44 U/L 11 - 10    No results for input(s):  LABCA2 in the last 72 hours.   STUDIES: No results found.   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             (b) rituximab added to ibrutinib, first dose 08/27/2018             (c) hepatitis B studies 08/27/2018 negative             (f) ibrutinib/rituximab discontinued after 10/02/2018 dose w/o obvious response  (6) bendamustine/rituximab started 10/22/2018, discontinued after 1 cycle with rapid progression  (7) obinutuzumab/Gaziva started 11/11/2018  (a) second dose given 11/19/2018 together with chemotherapy  (8) CH[O]P chemotherapy started 11/19/2018             (a) echocardiogram on 11/07/2018 shows EF of 55-60%  (b) second cycle of CH[O]P postponed because of pandemic  (9) severe immunocompromise: Marked hypogammaglobulinemia  (a) received IVIG 12/02/2018, repeated 12/17/2018   PLAN:  Maria [name]  is aware that she likely would not survive the coronavirus infection and she is taking only appropriate precautions.  She is severely immunocompromised and she will  receive IVIG today in an attempt to at least help her humoral immunity.  She will also receive Granix today.  I am going to hold her chemotherapy and immunotherapy since I do not want to make her immune system worse than it already is.  We have temporarily obtained a very good response of her lymphoma/leukemia and further treatment can certainly wait until she returns here 01/14/2019.  Even though there are no localizing symptoms I am going to go ahead and put her on a Z-Pak.  We discussed nutrition and hydration issues and I gave her the recipe for rehydration solution.  She is to drink a minimum of a quart a day preferably 1-1/2 to 2 quarts daily  She knows to call us for any other issues that may develop before her next visit here.  [name], [name] Dad, MD  12/05/18 11:16 AM Medical Oncology and Hematology [facility] [address] [place], [place] [address] Tel. [phone] Fax. [phone]   I, Wilburn Mylar, am acting as scribe for Dr. [name] Dad.  [name].  I, Lurline Del MD, have reviewed the above documentation for accuracy and completeness, and I agree with the above.

## 2018-12-17 ENCOUNTER — Inpatient Hospital Stay: Payer: Medicare Other

## 2018-12-17 ENCOUNTER — Ambulatory Visit: Payer: Medicare Other

## 2018-12-17 ENCOUNTER — Inpatient Hospital Stay (HOSPITAL_BASED_OUTPATIENT_CLINIC_OR_DEPARTMENT_OTHER): Payer: Medicare Other | Admitting: Oncology

## 2018-12-17 ENCOUNTER — Other Ambulatory Visit: Payer: Self-pay

## 2018-12-17 ENCOUNTER — Other Ambulatory Visit: Payer: Self-pay | Admitting: Medical

## 2018-12-17 ENCOUNTER — Inpatient Hospital Stay: Payer: Medicare Other | Admitting: Medical

## 2018-12-17 ENCOUNTER — Inpatient Hospital Stay: Payer: Medicare Other | Attending: Oncology

## 2018-12-17 VITALS — BP 116/58 | HR 74 | Temp 98.3°F | Resp 16

## 2018-12-17 VITALS — BP 119/56 | HR 78 | Temp 100.2°F | Resp 18

## 2018-12-17 DIAGNOSIS — Z5112 Encounter for antineoplastic immunotherapy: Secondary | ICD-10-CM | POA: Insufficient documentation

## 2018-12-17 DIAGNOSIS — D801 Nonfamilial hypogammaglobulinemia: Secondary | ICD-10-CM

## 2018-12-17 DIAGNOSIS — C9110 Chronic lymphocytic leukemia of B-cell type not having achieved remission: Secondary | ICD-10-CM | POA: Insufficient documentation

## 2018-12-17 DIAGNOSIS — Z95828 Presence of other vascular implants and grafts: Secondary | ICD-10-CM

## 2018-12-17 DIAGNOSIS — Z5111 Encounter for antineoplastic chemotherapy: Secondary | ICD-10-CM | POA: Diagnosis present

## 2018-12-17 DIAGNOSIS — R509 Fever, unspecified: Secondary | ICD-10-CM

## 2018-12-17 DIAGNOSIS — Z5189 Encounter for other specified aftercare: Secondary | ICD-10-CM | POA: Insufficient documentation

## 2018-12-17 LAB — COMPREHENSIVE METABOLIC PANEL
ALT: <6 U/L (ref 0–44)
AST: 9 U/L — ABNORMAL LOW (ref 15–41)
Albumin: 2.6 g/dL — ABNORMAL LOW (ref 3.5–5.0)
Alkaline Phosphatase: 84 U/L (ref 38–126)
Anion gap: 9 (ref 5–15)
BUN: 21 mg/dL (ref 8–23)
CO2: 26 mmol/L (ref 22–32)
Calcium: 8.5 mg/dL — ABNORMAL LOW (ref 8.9–10.3)
Chloride: 98 mmol/L (ref 98–111)
Creatinine, Ser: 0.92 mg/dL (ref 0.44–1.00)
GFR calc Af Amer: >60 mL/min (ref >60)
GFR calc non Af Amer: >60 mL/min (ref >60)
Glucose, Bld: 170 mg/dL — ABNORMAL HIGH (ref 70–99)
Potassium: 3.9 mmol/L (ref 3.5–5.1)
Sodium: 133 mmol/L — ABNORMAL LOW (ref 135–145)
Total Bilirubin: 1.4 mg/dL — ABNORMAL HIGH (ref 0.3–1.2)
Total Protein: 5.9 g/dL — ABNORMAL LOW (ref 6.5–8.1)

## 2018-12-17 LAB — CBC WITH DIFFERENTIAL/PLATELET
Abs Immature Granulocytes: 0.06 10*3/uL (ref 0.00–0.07)
Basophils Absolute: 0 10*3/uL (ref 0.0–0.1)
Basophils Relative: 0 %
Eosinophils Absolute: 0 10*3/uL (ref 0.0–0.5)
Eosinophils Relative: 0 %
HCT: 32.3 % — ABNORMAL LOW (ref 36.0–46.0)
Hemoglobin: 10.4 g/dL — ABNORMAL LOW (ref 12.0–15.0)
Immature Granulocytes: 1 %
Lymphocytes Relative: 8 %
Lymphs Abs: 0.5 10*3/uL — ABNORMAL LOW (ref 0.7–4.0)
MCH: 28.9 pg (ref 26.0–34.0)
MCHC: 32.2 g/dL (ref 30.0–36.0)
MCV: 89.7 fL (ref 80.0–100.0)
Monocytes Absolute: 0.2 10*3/uL (ref 0.1–1.0)
Monocytes Relative: 4 %
Neutro Abs: 4.9 10*3/uL (ref 1.7–7.7)
Neutrophils Relative %: 87 %
Platelets: 55 10*3/uL — ABNORMAL LOW (ref 150–400)
RBC: 3.6 MIL/uL — ABNORMAL LOW (ref 3.87–5.11)
RDW: 18.3 % — ABNORMAL HIGH (ref 11.5–15.5)
WBC: 5.6 10*3/uL (ref 4.0–10.5)
nRBC: 0 % (ref 0.0–0.2)

## 2018-12-17 LAB — URIC ACID: Uric Acid, Serum: 4.3 mg/dL (ref 2.5–7.1)

## 2018-12-17 LAB — LACTATE DEHYDROGENASE: LDH: 177 U/L (ref 98–192)

## 2018-12-17 MED ORDER — IMMUNE GLOBULIN (HUMAN) 10 GM/100ML IV SOLN
80.0000 g | Freq: Once | INTRAVENOUS | Status: AC
  Administered 2018-12-17: 80 g via INTRAVENOUS
  Filled 2018-12-17: qty 800

## 2018-12-17 MED ORDER — SODIUM CHLORIDE 0.9% FLUSH
10.0000 mL | Freq: Once | INTRAVENOUS | Status: AC
  Administered 2018-12-17: 10 mL
  Filled 2018-12-17: qty 10

## 2018-12-17 MED ORDER — AZITHROMYCIN 250 MG PO TABS: ORAL_TABLET | ORAL | 0 refills | Status: DC

## 2018-12-17 MED ORDER — DIPHENHYDRAMINE HCL 25 MG PO CAPS
25.0000 mg | ORAL_CAPSULE | Freq: Once | ORAL | Status: AC
  Administered 2018-12-17: 25 mg via ORAL

## 2018-12-17 MED ORDER — HEPARIN SOD (PORK) LOCK FLUSH 100 UNIT/ML IV SOLN
500.0000 [IU] | Freq: Once | INTRAVENOUS | Status: AC
  Administered 2018-12-17: 500 [IU] via INTRAVENOUS
  Filled 2018-12-17: qty 5

## 2018-12-17 MED ORDER — ACETAMINOPHEN 325 MG PO TABS
ORAL_TABLET | ORAL | Status: AC
  Filled 2018-12-17: qty 2

## 2018-12-17 MED ORDER — DIPHENHYDRAMINE HCL 25 MG PO CAPS
ORAL_CAPSULE | ORAL | Status: AC
  Filled 2018-12-17: qty 1

## 2018-12-17 MED ORDER — ACETAMINOPHEN 325 MG PO TABS
650.0000 mg | ORAL_TABLET | Freq: Once | ORAL | Status: AC
  Administered 2018-12-17: 650 mg via ORAL

## 2018-12-17 MED ORDER — DEXTROSE 5 % IV SOLN
Freq: Once | INTRAVENOUS | Status: AC
  Administered 2018-12-17: 11:00:00 via INTRAVENOUS
  Filled 2018-12-17: qty 250

## 2018-12-17 MED ORDER — TBO-FILGRASTIM 480 MCG/0.8ML ~~LOC~~ SOSY
480.0000 ug | PREFILLED_SYRINGE | Freq: Once | SUBCUTANEOUS | Status: AC
  Administered 2018-12-17: 480 ug via SUBCUTANEOUS

## 2018-12-17 MED ORDER — TBO-FILGRASTIM 480 MCG/0.8ML ~~LOC~~ SOSY
PREFILLED_SYRINGE | SUBCUTANEOUS | Status: AC
  Filled 2018-12-17: qty 0.8

## 2018-12-17 NOTE — Patient Instructions (Signed)

## 2018-12-17 NOTE — Addendum Note (Signed)
Addended by: Chauncey Cruel on: 12/17/2018 10:54 AM   Modules accepted: Orders

## 2018-12-17 NOTE — Patient Instructions (Addendum)
Immune Globulin Injection What is this medicine? IMMUNE GLOBULIN (im MUNE GLOB yoo lin) helps to prevent or reduce the severity of certain infections in patients who are at risk. This medicine is collected from the pooled blood of many donors. It is used to treat immune system problems, thrombocytopenia, and Kawasaki syndrome. This medicine may be used for other purposes; ask your health care provider or pharmacist if you have questions. COMMON BRAND NAME(S): Baygam, BIVIGAM, Carimune, Carimune NF, cutaquig, Cuvitru, Flebogamma, Flebogamma DIF, GamaSTAN, GamaSTAN S/D, Gamimune N, Gammagard, Gammagard S/D, Gammaked, Gammaplex, Gammar-P IV, Gamunex, Gamunex-C, Hizentra, Iveegam, Iveegam EN, Octagam, Panglobulin, Panglobulin NF, panzyga, Polygam S/D, Privigen, Sandoglobulin, Venoglobulin-S, Vigam, Vivaglobulin, Xembify What should I tell my health care provider before I take this medicine? They need to know if you have any of these conditions: - diabetes - extremely low or no immune antibodies in the blood - heart disease - history of blood clots - hyperprolinemia - infection in the blood, sepsis - kidney disease - taking medicine that may change kidney function - ask your health care provider about your medicine - an unusual or allergic reaction to human immune globulin, albumin, maltose, sucrose, polysorbate 80, other medicines, foods, dyes, or preservatives - pregnant or trying to get pregnant - breast-feeding How should I use this medicine? This medicine is for injection into a muscle or infusion into a vein or skin. It is usually given by a health care professional in a hospital or clinic setting. In rare cases, some brands of this medicine might be given at home. You will be taught how to give this medicine. Use exactly as directed. Take your medicine at regular intervals. Do not take your medicine more often than directed. Talk to your pediatrician regarding the use of this  medicine in children. Special care may be needed. Overdosage: If you think you have taken too much of this medicine contact a poison control center or emergency room at once. NOTE: This medicine is only for you. Do not share this medicine with others. What if I miss a dose? It is important not to miss your dose. Call your doctor or health care professional if you are unable to keep an appointment. If you give yourself the medicine and you miss a dose, take it as soon as you can. If it is almost time for your next dose, take only that dose. Do not take double or extra doses. What may interact with this medicine? -aspirin and aspirin-like medicines -cisplatin -cyclosporine -medicines for infection like acyclovir, adefovir, amphotericin B, bacitracin, cidofovir, foscarnet, ganciclovir, gentamicin, pentamidine, vancomycin -NSAIDS, medicines for pain and inflammation, like ibuprofen or naproxen -pamidronate -vaccines -zoledronic acid This list may not describe all possible interactions. Give your health care provider a list of all the medicines, herbs, non-prescription drugs, or dietary supplements you use. Also tell them if you smoke, drink alcohol, or use illegal drugs. Some items may interact with your medicine. What should I watch for while using this medicine? Your condition will be monitored carefully while you are receiving this medicine. This medicine is made from pooled blood donations of many different people. It may be possible to pass an infection in this medicine. However, the donors are screened for infections and all products are tested for HIV and hepatitis. The medicine is treated to kill most or all bacteria and viruses. Talk to your doctor about the risks and benefits of this medicine. Do not have vaccinations for at least 14 days before, or until at  least 3 months after receiving this medicine. What side effects may I notice from receiving this medicine? Side effects that you  should report to your doctor or health care professional as soon as possible: -allergic reactions like skin rash, itching or hives, swelling of the face, lips, or tongue -breathing problems -chest pain or tightness -fever, chills -headache with nausea, vomiting -neck pain or difficulty moving neck -pain when moving eyes -pain, swelling, warmth in the leg -problems with balance, talking, walking -sudden weight gain -swelling of the ankles, feet, hands -trouble passing urine or change in the amount of urine Side effects that usually do not require medical attention (report to your doctor or health care professional if they continue or are bothersome): -dizzy, drowsy -flushing -increased sweating -leg cramps -muscle aches and pains -pain at site where injected This list may not describe all possible side effects. Call your doctor for medical advice about side effects. You may report side effects to FDA at 1-800-FDA-1088. Where should I keep my medicine? Keep out of the reach of children. This drug is usually given in a hospital or clinic and will not be stored at home. In rare cases, some brands of this medicine may be given at home. If you are using this medicine at home, you will be instructed on how to store this medicine. Throw away any unused medicine after the expiration date on the label. NOTE: This sheet is a summary. It may not cover all possible information. If you have questions about this medicine, talk to your doctor, pharmacist, or health care provider.  2019 Elsevier/Gold Standard (2008-11-17 11:44:49)    Tbo-Filgrastim injection What is this medicine? TBO-FILGRASTIM (T B O fil GRA stim) is a granulocyte colony-stimulating factor that stimulates the growth of neutrophils, a type of white blood cell important in the body's fight against infection. It is used to reduce the incidence of fever and infection in patients with certain types of cancer who are receiving  chemotherapy that affects the bone marrow. This medicine may be used for other purposes; ask your health care provider or pharmacist if you have questions. COMMON BRAND NAME(S): Granix What should I tell my health care provider before I take this medicine? They need to know if you have any of these conditions: -bone scan or tests planned -kidney disease -sickle cell anemia -an unusual or allergic reaction to tbo-filgrastim, filgrastim, pegfilgrastim, other medicines, foods, dyes, or preservatives -pregnant or trying to get pregnant -breast-feeding How should I use this medicine? This medicine is for injection under the skin. If you get this medicine at home, you will be taught how to prepare and give this medicine. Refer to the Instructions for Use that come with your medication packaging. Use exactly as directed. Take your medicine at regular intervals. Do not take your medicine more often than directed. It is important that you put your used needles and syringes in a special sharps container. Do not put them in a trash can. If you do not have a sharps container, call your pharmacist or healthcare provider to get one. Talk to your pediatrician regarding the use of this medicine in children. While this drug may be prescribed for children as young as 17 month of age for selected conditions, precautions do apply. Overdosage: If you think you have taken too much of this medicine contact a poison control center or emergency room at once. NOTE: This medicine is only for you. Do not share this medicine with others. What if I miss  a dose? It is important not to miss your dose. Call your doctor or health care professional if you miss a dose. What may interact with this medicine? This medicine may interact with the following medications: -medicines that may cause a release of neutrophils, such as lithium This list may not describe all possible interactions. Give your health care provider a list of all  the medicines, herbs, non-prescription drugs, or dietary supplements you use. Also tell them if you smoke, drink alcohol, or use illegal drugs. Some items may interact with your medicine. What should I watch for while using this medicine? You may need blood work done while you are taking this medicine. What side effects may I notice from receiving this medicine? Side effects that you should report to your doctor or health care professional as soon as possible: -allergic reactions like skin rash, itching or hives, swelling of the face, lips, or tongue -back pain -blood in the urine -dark urine -dizziness -fast heartbeat -feeling faint -shortness of breath or breathing problems -signs and symptoms of infection like fever or chills; cough; or sore throat -signs and symptoms of kidney injury like trouble passing urine or change in the amount of urine -stomach or side pain, or pain at the shoulder -sweating -swelling of the legs, ankles, or abdomen -tiredness Side effects that usually do not require medical attention (report to your doctor or health care professional if they continue or are bothersome): -bone pain -diarrhea -headache -muscle pain -vomiting This list may not describe all possible side effects. Call your doctor for medical advice about side effects. You may report side effects to FDA at 1-800-FDA-1088. Where should I keep my medicine? Keep out of the reach of children. Store in a refrigerator between 2 and 8 degrees C (36 and 46 degrees F). Keep in carton to protect from light. Throw away this medicine if it is left out of the refrigerator for more than 5 consecutive days. Throw away any unused medicine after the expiration date. NOTE: This sheet is a summary. It may not cover all possible information. If you have questions about this medicine, talk to your doctor, pharmacist, or health care provider.  2019 Elsevier/Gold Standard (2017-04-16 16:56:18)

## 2018-12-17 NOTE — Progress Notes (Signed)
Confirmed plan with MD. Madaline Brilliant to give GCSF today with ANC = 4.9. IVIG predmeds added: Ben 25mg  po and APAP 650mg  po. IVIG dose is 1g/kg.  Hardie Pulley, PharmD, BCPS, BCOP

## 2018-12-17 NOTE — Progress Notes (Signed)
Pt assessed by PA Sandi Mealy in lobby after having temp 100.6.  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  Pt placed in private room, MD [name] to assess pt in room, pt will receive infusion & shot in private room.  Pt VU of plan.  Ok to give IVIG & granix today without waiting for labs to result per MD [name].  At end of IVIG infusion during flush pt reports headache behind L eye.  Called Md [name], VO to give 650 PO tylenol and to add fluids.  Flush completed & D5 & IVIG detached, pt switched to 500 ml NS in separate line, given 250 ml.  Reports complete relief of headache after a few minutes.  VSS at end of infusion, afebrile.  A&Ox4.  Escorted by [facility] to exit with belongings.  Granix also given during tx, tolerated well.  Information about IVIG and granix given in d/c papers.  Pt VU of d/c instructions and to follow up as needed.

## 2018-12-18 ENCOUNTER — Ambulatory Visit: Payer: Medicare Other

## 2018-12-18 LAB — IGG, IGA, IGM
IgA: 26 mg/dL — ABNORMAL LOW (ref 87–352)
IgG (Immunoglobin G), Serum: 787 mg/dL (ref 586–1602)
IgM (Immunoglobulin M), Srm: <5 mg/dL — ABNORMAL LOW (ref 26–217)

## 2018-12-18 NOTE — Progress Notes (Signed)
[place] [id number] 08-Jul-1951 68 y.o.  Maria [name] is managed by Dr. Jana Hakim  Actively treated with chemotherapy/immunotherapy/hormonal therapy: yes  Current therapy: CHOP, Gazyva, and IVIG  Next scheduled appointment with provider: 12/17/2018  Assessment: Plan:    CLL (chronic lymphocytic leukemia) ([place])  Fever, unspecified fever cause  Please see After Visit Summary for patient specific instructions.  Future Appointments  Date Time Provider [place]  12/29/2018  2:30 PM Ladene Artist, MD [facility]  01/14/2019  8:30 AM [facility] LAB 3 [facility] None  01/14/2019  8:45 AM [facility] [place] [facility] None  01/14/2019  9:00 AM [name], [name] Dad, MD [facility] None    No orders of the defined types were placed in this encounter.      Subjective:   Patient ID:  Maria [name] is a 68 y.o. (DOB 1950/11/14) female.  Chief Complaint: No chief complaint on file.   HPI Maria [name]  is a 68 y.o. female who was seen in the waiting room after she was found to have a fever of 100.5.  She presented to the clinic today for dosing with IVIG and for Granix.  She is scheduled to see Dr. Jana Hakim.  Are you experiencing any of the following:  Shortness of breath:        No Cough :         No Sneezing:         No Rhinorrhea:         No Sore throat:         No Headache:         No Diarrhea:         No  Have you been tested for COVID-19?     No Have you tested positive for COVID-19?     No Have you had contact with anyone testing positive for COVID-19?  No       Allergies:  Allergies  Allergen Reactions  . Iohexol Swelling and Rash  . Compazine [Prochlorperazine Edisylate] Other (See Comments)    Mild confusion  . Contrast Media  [Iodinated Diagnostic Agents]   . Lactose Intolerance (Gi)     Diarrhea, gas bloating  . Lactulose Diarrhea and Other (See Comments)    gas bloating  . Cephalexin Rash     Please see review of systems for further details on the patient's review from today.   Review of Systems:  Review of Systems  Constitutional: Positive for fever. Negative for chills and diaphoresis.  HENT: Negative for congestion, rhinorrhea and sore throat.   Respiratory: Negative for cough and shortness of breath.   Cardiovascular: Negative for palpitations.  Gastrointestinal: Negative for diarrhea.  Neurological: Negative for headaches.    Objective:   Physical Exam:  Vital Signs: Temperature: 100.5  Physical Exam Constitutional:      General: She is not in acute distress.    Appearance: She is not ill-appearing or diaphoretic.  HENT:     Head: Normocephalic and atraumatic.  Cardiovascular:     Rate and Rhythm: Normal rate and regular rhythm.     Heart sounds: No murmur. No gallop.   Pulmonary:     Effort: Pulmonary effort is normal. No respiratory distress.     Breath sounds: Normal breath sounds. No wheezing or rales.  Skin:    General: Skin is warm and dry.     Findings: No erythema or rash.  Neurological:     General: No focal  deficit present.  Psychiatric:        Mood and Affect: Mood normal.        Behavior: Behavior normal.        Thought Content: Thought content normal.        Judgment: Judgment normal.     Lab Review:     Component Value Date/Time   NA 133 (L) 12/17/2018 0946   NA 139 08/05/2017 1353   K 3.9 12/17/2018 0946   K 3.7 08/05/2017 1353   CL 98 12/17/2018 0946   CO2 26 12/17/2018 0946   CO2 27 08/05/2017 1353   GLUCOSE 170 (H) 12/17/2018 0946   GLUCOSE 91 08/05/2017 1353   BUN 21 12/17/2018 0946   BUN 17.3 08/05/2017 1353   CREATININE 0.92 12/17/2018 0946   CREATININE 0.90 11/24/2018 1107   CREATININE 1.0 08/05/2017 1353   CALCIUM 8.5 (L) 12/17/2018 0946   CALCIUM 9.2 08/05/2017 1353   PROT 5.9 (L) 12/17/2018 0946   PROT 6.3 (L) 08/05/2017 1353   ALBUMIN 2.6 (L) 12/17/2018 0946   ALBUMIN 3.8 08/05/2017 1353   AST 9 (L)  12/17/2018 0946   AST <6 (L) 11/24/2018 1107   AST 18 08/05/2017 1353   ALT <6 12/17/2018 0946   ALT 9 11/24/2018 1107   ALT 14 08/05/2017 1353   ALKPHOS 84 12/17/2018 0946   ALKPHOS 76 08/05/2017 1353   BILITOT 1.4 (H) 12/17/2018 0946   BILITOT 1.2 11/24/2018 1107   BILITOT 0.59 08/05/2017 1353   GFRNONAA >60 12/17/2018 0946   GFRNONAA >60 11/24/2018 1107   GFRAA >60 12/17/2018 0946   GFRAA >60 11/24/2018 1107       Component Value Date/Time   WBC 5.6 12/17/2018 0946   RBC 3.60 (L) 12/17/2018 0946   HGB 10.4 (L) 12/17/2018 0946   HGB 11.9 (L) 11/21/2018 1135   HGB 13.9 08/05/2017 1353   HCT 32.3 (L) 12/17/2018 0946   HCT 42.9 08/05/2017 1353   PLT 55 (L) 12/17/2018 0946   PLT 51 (L) 11/21/2018 1135   PLT 162 08/05/2017 1353   MCV 89.7 12/17/2018 0946   MCV 83.1 08/05/2017 1353   MCH 28.9 12/17/2018 0946   MCHC 32.2 12/17/2018 0946   RDW 18.3 (H) 12/17/2018 0946   RDW 15.1 (H) 08/05/2017 1353   LYMPHSABS 0.5 (L) 12/17/2018 0946   LYMPHSABS 2.1 08/05/2017 1353   MONOABS 0.2 12/17/2018 0946   MONOABS 0.4 08/05/2017 1353   EOSABS 0.0 12/17/2018 0946   EOSABS 0.1 08/05/2017 1353   BASOSABS 0.0 12/17/2018 0946   BASOSABS 0.0 08/05/2017 1353   -------------------------------  Imaging from last 24 hours (if applicable):  Radiology interpretation: No results found.      This case was discussed with Dr. Jana Hakim. He expressed his agreement with my management of this patient.

## 2018-12-23 ENCOUNTER — Telehealth: Payer: Self-pay | Admitting: *Deleted

## 2018-12-23 NOTE — Telephone Encounter (Signed)
This RN spoke with pt per her call stating current status of feeling very well and if she could receive treatment possibly this week vs first week in May.  Her thought is to proceed with treatment now - so she could likely not need treatment until later in May when the Covid 19 is not at it's peak.  Per discussion with MD- above is reasonable if labs allow.  Discussed with pt.  Request sent for lab and infusion- MD appointment will be arranged as soon as treatment time is known.

## 2018-12-25 ENCOUNTER — Other Ambulatory Visit: Payer: Self-pay | Admitting: Oncology

## 2018-12-25 ENCOUNTER — Telehealth: Payer: Self-pay | Admitting: Oncology

## 2018-12-25 NOTE — Telephone Encounter (Signed)
Tried to call patient per 4/14 sch message - unable to reach - let RN know .  Pt picked up and hung up .

## 2018-12-25 NOTE — Progress Notes (Signed)
[place]  [phone] [phone]    ID: Maria [name]   DOB: May 21, 1951  MR#: [id number]  BSJ#:[id number]  Patient Care Team: Marton Redwood, MD as PCP - General (Internal Medicine) [name], MD as Consulting Physician (Orthopedic Surgery) Lulamae Skorupski, [name] Dad, MD as Consulting Physician (Oncology)   CHIEF COMPLAINT: Small lymphocytic lymphoma  CURRENT TREATMENT: CHOP/obinutuzumab   INTERVAL HISTORY: Maria [name] returns today for follow-up and treatment of her chronic lymphoid leukemia/small cell lymphocytic lymphoma.   She received 1 cycle of CHOP, with the vincristine discontinued because of concerns regarding neuropathy, and also obinutuzumab.  She had severe pancytopenia.  And accordingly today for cycle 2 the CH[O]P will be at 50%.  We are going to give her the obinutuzumab at the usual 1000 mg dose.  We repeated her IgG most recently it was adequate so that is not being repeated today   REVIEW OF SYSTEMS: Maria [name] notes that they are doing construction around her house, so she has been avoiding going outside. As part of COVID-19 prevention, her sister is doing [employer]. Maria [name] notes that her weight seems to be stable. She still feels some lymph nodes around her left pre auricular. The patient denies unusual headaches, visual changes, nausea, vomiting, or dizziness. There has been no unusual cough, phlegm production, or pleurisy. This been no change in bowel or bladder habits. The patient denies unexplained weight loss, bleeding, rash, or fever. A detailed review of systems was otherwise noncontributory.   Wt Readings from Last 3 Encounters:  12/26/18 173 lb 1.6 oz (78.5 kg)  12/01/18 182 lb (82.6 kg)  12/01/18 182 lb (82.6 kg)      HISTORY OF PRESENT ILLNESS: From the original intake note:  Maria [name] developed what she thought was "the flu" in December of 2002. She noted a large lymph node developing in  her right anterior cervical area. 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   Her subsequent history is as detailed above.   PAST MEDICAL HISTORY: Past Medical History:  Diagnosis Date  . Allergy   . Arthritis   . Asthma   . Cancer [facility])    chronic lymphacytic leukemia  . CLL (chronic lymphocytic leukemia) ([place])   . Diabetes mellitus    PMH  . Headache    migraine  . Hyperlipidemia   . Hypertension   . Hypothyroidism   . Lymphoma ([place])   . Thyroid disease    hypothyroidism  . Wears glasses     PAST SURGICAL HISTORY: Past Surgical History:  Procedure Laterality Date  . ABDOMINAL HYSTERECTOMY    . AXILLARY LYMPH NODE BIOPSY Right 08/21/2018   Procedure: RIGHT AXILLARY LYMPH NODE BIOPSY ERAS PATHWAY;  Surgeon: [name], MD;  Location: [facility];  Service: General;  Laterality: Right;  LMA  . BREAST EXCISIONAL BIOPSY Right   . COLONOSCOPY    . IR IMAGING GUIDED PORT INSERTION  11/10/2018  . LYMPH NODE DISSECTION    . STRABISMUS SURGERY    . TONSILLECTOMY    . TOTAL KNEE ARTHROPLASTY Right 12/03/2016   Procedure: RIGHT TOTAL KNEE ARTHROPLASTY;  Surgeon: [name], MD;  Location: [facility];  Service: Orthopedics;  Laterality: Right;    FAMILY HISTORY: Family History  Problem Relation Age of Onset  . Liver cancer Mother   . Heart disease Father   . Breast cancer Sister 55  . Colon cancer Neg Hx   .  Pancreatic cancer Neg Hx   . Rectal cancer Neg Hx   . Stomach cancer Neg Hx    The patient's father died at the age of 89 from a myocardial infarction. The patient's mother died at the age of 47 from primary liver carcinoma. The patient had no brothers. Her one sister, Maria [name], has a history of breast cancer   GYNECOLOGIC HISTORY: Menarche at around age 59. The patient is GX P0. 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 She lives by herself with her cats Maria [name]. She attends a [employer], operational.              ADVANCED DIRECTIVES: Not in place   HEALTH MAINTENANCE: Social History   Socioeconomic History  . Marital status: Single    Spouse name: Not on file  . Number of children: Not on file  . Years of education: Not on file  . Highest education level: Not on file  Occupational History  . Not on file  Social Needs  . Financial resource strain: Not hard at all  . Food insecurity:    Worry: Never true    Inability: Never true  . Transportation needs:    Medical: No    Non-medical: No  Tobacco Use  . Smoking status: Never Smoker  . Smokeless tobacco: Never Used  Substance and Sexual Activity  . Alcohol use: Yes    Comment: occasional alcohol intake; once or twice monthly  . Drug use: No  . Sexual activity: Not on file  Lifestyle  . Physical activity:    Days per week: 0 days    Minutes per session: 0 min  . Stress: Not at all  Relationships  . Social connections:    Talks on phone: Once a week    Gets together: Once a week    Attends religious service: Never    Active member of club or organization: No    Attends meetings of clubs or organizations: Never    Relationship status: Never married  . Intimate partner violence:    Fear of current or ex partner: No    Emotionally abused: No    Physically abused: No    Forced sexual activity: No  Other Topics Concern  . Not on file  Social History Narrative  . Not on file                Colonoscopy: 06/01/2014/[facility]             PAP:             Bone density:             Lipid panel:  228/114/50/155/ratio 4.6  On 10/01/2016  ALLERGIES: Allergies  Allergen Reactions  . Iohexol Swelling and Rash  . Compazine [Prochlorperazine Edisylate] Other (See Comments)    Mild confusion  . Contrast Media   [Iodinated Diagnostic Agents]   . Lactose Intolerance (Gi)     Diarrhea, gas bloating  . Lactulose Diarrhea and Other (See Comments)    gas bloating  . Cephalexin Rash    CURRENT MEDICATIONS: Current Outpatient Medications  Medication Sig Dispense Refill  . acetaminophen (TYLENOL 8 HOUR ARTHRITIS PAIN) 650 MG CR tablet Take 650 mg by mouth every 8 (eight) hours as needed for pain.    Marland Kitchen acyclovir (  ZOVIRAX) 200 MG capsule Take 1 capsule (200 mg total) by mouth 3 (three) times daily. 90 capsule 4  . allopurinol (ZYLOPRIM) 300 MG tablet Take 1 tablet (300 mg total) by mouth daily. 90 tablet 4  . ciprofloxacin (CIPRO) 500 MG tablet Take 1 tablet (500 mg total) by mouth 2 (two) times daily. 40 tablet 0  . furosemide (LASIX) 20 MG tablet Take 1 tablet (20 mg total) by mouth daily as needed. 30 tablet 0  . hydrochlorothiazide (HYDRODIURIL) 12.5 MG tablet Take 12.5 mg by mouth daily.    . hydrocortisone (ANUSOL-HC) 2.5 % rectal cream Place rectally 3 (three) times daily. 30 g 0  . KLOR-CON M20 20 MEQ tablet TAKE 1 TABLET BY MOUTH EVERY DAY 90 tablet 1  . lidocaine-prilocaine (EMLA) cream Apply to affected area once 30 g 3  . predniSONE (DELTASONE) 20 MG tablet Take 3 tablets (60 mg total) by mouth daily with breakfast. Take days 1-5 starting on CHOP chemo day 15 tablet 6  . rosuvastatin (CRESTOR) 10 MG tablet Take 10 mg by mouth at bedtime.  11  . SYNTHROID 88 MCG tablet Take 88 mcg by mouth daily before breakfast.      No current facility-administered medications for this visit.     OBJECTIVE: Middle-aged white woma who appears stated age 81:   12/26/18 0843  BP: (!) 141/63  [name]: 89  Resp: 17  Temp: 97.8 F (36.6 C)  SpO2: 100%    Sclerae unicteric, pupils round and equal She has significant left cervical and left preauricular adenopathy; no right supraclavicular or cervical adenopathy; has bilateral axillary adenopathy; no inguinal adenopathy. Lungs no rales or rhonchi Heart  regular rate and rhythm Abd soft, nontender, positive bowel sounds MSK no focal spinal tenderness, no upper extremity lymphedema Neuro: nonfocal, well oriented, appropriate affect Breasts: Deferred Skin: Lesion on medial aspect of right upper extremity and much below       ECOG FS: 2 - Symptomatic, <50% confined to bed  LAB RESULTS: Lab Results  Component Value Date   WBC 7.3 12/26/2018   NEUTROABS 5.3 12/26/2018   HGB 10.6 (L) 12/26/2018   HCT 33.4 (L) 12/26/2018   MCV 91.5 12/26/2018   PLT 127 (L) 12/26/2018   CMP Latest Ref Rng & Units 12/17/2018 12/03/2018 12/02/2018  Glucose 70 - 99 mg/dL 170(H) 209(H) 236(H)  BUN 8 - 23 mg/dL 21 26(H) 34(H)  Creatinine 0.44 - 1.00 mg/dL 0.92 0.83 1.15(H)  Sodium 135 - 145 mmol/L 133(L) 131(L) 131(L)  Potassium 3.5 - 5.1 mmol/L 3.9 4.4 3.3(L)  Chloride 98 - 111 mmol/L 98 105 100  CO2 22 - 32 mmol/L 26 22 23   Calcium 8.9 - 10.3 mg/dL 8.5(L) 6.8(L) 7.1(L)  Total Protein 6.5 - 8.1 g/dL 5.9(L) 5.4(L) -  Total Bilirubin 0.3 - 1.2 mg/dL 1.4(H) 0.7 -  Alkaline Phos 38 - 126 U/L 84 65 -  AST 15 - 41 U/L 9(L) 12(L) -  ALT 0 - 44 U/L <6 11 -    No results for input(s): LABCA2 in the last 72 hours.   STUDIES: No results found.   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             (b) rituximab added to ibrutinib, first dose 08/27/2018             (c) hepatitis B studies 08/27/2018 negative             (f) ibrutinib/rituximab discontinued after 10/02/2018 dose w/o obvious response  (6) bendamustine/rituximab started 10/22/2018, discontinued after 1 cycle with rapid progression  (7) obinutuzumab/Gaziva started 11/11/2018  (a) second dose given 11/19/2018 together with chemotherapy  (b) third dose given 12/26/2018 and subsequent doses with chemotherapy  (8) CH[O]P chemotherapy started 11/19/2018             (a) echocardiogram on 11/07/2018 shows EF of 55-60%  (b) second cycle of CH[O]P postponed because of pandemic, given 12/26/2018  (9) severe immunocompromise: Marked hypogammaglobulinemia  (a) received IVIG 12/02/2018, repeated 12/17/2018   PLAN:  Tomesha's counts have improved and she is ready to proceed to chemotherapy at this point.  We again discussed the possible toxicities side effects and complications and on the one hand I hate for her to become even more immunocompromise and she already is, and on the other hand she is having significant adenopathy and progression of disease.  Accordingly we are treating her today.  We are doing half dose chemo but full dose will be noted to tomorrow.  We discussed healthcare power of attorney issues and she is intending to name her niece Maria [name] as her healthcare power of attorney.  I gave Javier the appropriate documents to complete and we are trying to get her in notary today to so she can get it done.  She will see me by WebEx in about 7 to 10 days.  She will have her next cycle of chemotherapy in about 6 weeks  She does have what appears to be a resolving  boil in the right upper extremity.  I am putting her on ciprofloxacin 500 mg twice a day to take care of that issue  She knows to call for any other problems that may develop before her next visit here.  Mavis Gravelle, [name] Dad, MD  12/05/18 11:16 AM Medical Oncology and Hematology [facility] [address] [place], [place] [address] Tel. [phone] Fax. [phone]  I, Jacqualyn Posey am acting as a Education administrator for Chauncey Cruel, MD.   I, Lurline Del MD, have reviewed the above documentation for accuracy and completeness, and I agree with the above.

## 2018-12-26 ENCOUNTER — Inpatient Hospital Stay (HOSPITAL_BASED_OUTPATIENT_CLINIC_OR_DEPARTMENT_OTHER): Payer: Medicare Other | Admitting: Oncology

## 2018-12-26 ENCOUNTER — Inpatient Hospital Stay: Payer: Medicare Other

## 2018-12-26 ENCOUNTER — Other Ambulatory Visit: Payer: Self-pay

## 2018-12-26 VITALS — BP 141/63 | HR 89 | Temp 97.8°F | Resp 17 | Ht 62.0 in | Wt 173.1 lb

## 2018-12-26 VITALS — BP 118/51 | HR 78 | Temp 98.9°F | Resp 18

## 2018-12-26 DIAGNOSIS — C9110 Chronic lymphocytic leukemia of B-cell type not having achieved remission: Secondary | ICD-10-CM

## 2018-12-26 DIAGNOSIS — Z5112 Encounter for antineoplastic immunotherapy: Secondary | ICD-10-CM | POA: Diagnosis not present

## 2018-12-26 DIAGNOSIS — D801 Nonfamilial hypogammaglobulinemia: Secondary | ICD-10-CM | POA: Diagnosis not present

## 2018-12-26 LAB — CBC WITH DIFFERENTIAL/PLATELET
Abs Immature Granulocytes: 0 10*3/uL (ref 0.00–0.07)
Band Neutrophils: 1 %
Basophils Absolute: 0.1 10*3/uL (ref 0.0–0.1)
Basophils Relative: 1 %
Eosinophils Absolute: 0.1 10*3/uL (ref 0.0–0.5)
Eosinophils Relative: 2 %
HCT: 33.4 % — ABNORMAL LOW (ref 36.0–46.0)
Hemoglobin: 10.6 g/dL — ABNORMAL LOW (ref 12.0–15.0)
Lymphocytes Relative: 23 %
Lymphs Abs: 1.7 10*3/uL (ref 0.7–4.0)
MCH: 29 pg (ref 26.0–34.0)
MCHC: 31.7 g/dL (ref 30.0–36.0)
MCV: 91.5 fL (ref 80.0–100.0)
Monocytes Absolute: 0.1 10*3/uL (ref 0.1–1.0)
Monocytes Relative: 1 %
Neutro Abs: 5.3 10*3/uL (ref 1.7–17.7)
Neutrophils Relative %: 72 %
Platelets: 127 10*3/uL — ABNORMAL LOW (ref 150–400)
RBC: 3.65 MIL/uL — ABNORMAL LOW (ref 3.87–5.11)
RDW: 19.4 % — ABNORMAL HIGH (ref 11.5–15.5)
WBC: 7.3 10*3/uL (ref 4.0–10.5)
nRBC: 0 % (ref 0.0–0.2)

## 2018-12-26 LAB — LACTATE DEHYDROGENASE: LDH: 283 U/L — ABNORMAL HIGH (ref 98–192)

## 2018-12-26 LAB — CMP (CANCER CENTER ONLY)
ALT: <6 U/L (ref 0–44)
AST: 12 U/L — ABNORMAL LOW (ref 15–41)
Albumin: 3.1 g/dL — ABNORMAL LOW (ref 3.5–5.0)
Alkaline Phosphatase: 91 U/L (ref 38–126)
Anion gap: 11 (ref 5–15)
BUN: 17 mg/dL (ref 8–23)
CO2: 26 mmol/L (ref 22–32)
Calcium: 8.5 mg/dL — ABNORMAL LOW (ref 8.9–10.3)
Chloride: 99 mmol/L (ref 98–111)
Creatinine: 1.26 mg/dL — ABNORMAL HIGH (ref 0.44–1.00)
GFR, Est AFR Am: 51 mL/min — ABNORMAL LOW (ref >60)
GFR, Estimated: 44 mL/min — ABNORMAL LOW (ref >60)
Glucose, Bld: 128 mg/dL — ABNORMAL HIGH (ref 70–99)
Potassium: 3 mmol/L — CL (ref 3.5–5.1)
Sodium: 136 mmol/L (ref 135–145)
Total Bilirubin: 0.6 mg/dL (ref 0.3–1.2)
Total Protein: 6.8 g/dL (ref 6.5–8.1)

## 2018-12-26 MED ORDER — DOXORUBICIN HCL CHEMO IV INJECTION 2 MG/ML
25.0000 mg/m2 | Freq: Once | INTRAVENOUS | Status: AC
  Administered 2018-12-26: 11:00:00 50 mg via INTRAVENOUS
  Filled 2018-12-26: qty 25

## 2018-12-26 MED ORDER — SODIUM CHLORIDE 0.9 % IV SOLN
400.0000 mg/m2 | Freq: Once | INTRAVENOUS | Status: AC
  Administered 2018-12-26: 12:00:00 800 mg via INTRAVENOUS
  Filled 2018-12-26: qty 40

## 2018-12-26 MED ORDER — DIPHENHYDRAMINE HCL 50 MG/ML IJ SOLN
INTRAMUSCULAR | Status: AC
  Filled 2018-12-26: qty 1

## 2018-12-26 MED ORDER — SODIUM CHLORIDE 0.9 % IV SOLN
Freq: Once | INTRAVENOUS | Status: AC
  Administered 2018-12-26: 10:00:00 via INTRAVENOUS
  Filled 2018-12-26: qty 5

## 2018-12-26 MED ORDER — SODIUM CHLORIDE 0.9 % IV SOLN
Freq: Once | INTRAVENOUS | Status: AC
  Administered 2018-12-26: 10:00:00 via INTRAVENOUS
  Filled 2018-12-26: qty 250

## 2018-12-26 MED ORDER — CIPROFLOXACIN HCL 500 MG PO TABS: 500.0000 mg | ORAL_TABLET | Freq: Two times a day (BID) | ORAL | 0 refills | Status: DC

## 2018-12-26 MED ORDER — SODIUM CHLORIDE 0.9 % IV SOLN: Freq: Once | INTRAVENOUS | Status: DC

## 2018-12-26 MED ORDER — SODIUM CHLORIDE 0.9% FLUSH
10.0000 mL | INTRAVENOUS | Status: DC | PRN
  Administered 2018-12-26: 10 mL
  Filled 2018-12-26: qty 10

## 2018-12-26 MED ORDER — PEGFILGRASTIM 6 MG/0.6ML ~~LOC~~ PSKT
PREFILLED_SYRINGE | SUBCUTANEOUS | Status: AC
  Filled 2018-12-26: qty 0.6

## 2018-12-26 MED ORDER — HYDROCORTISONE 2.5 % RE CREA: TOPICAL_CREAM | Freq: Three times a day (TID) | RECTAL | 0 refills | Status: DC

## 2018-12-26 MED ORDER — DEXAMETHASONE SODIUM PHOSPHATE 10 MG/ML IJ SOLN: 10.0000 mg | Freq: Once | INTRAMUSCULAR | Status: DC

## 2018-12-26 MED ORDER — DEXAMETHASONE SODIUM PHOSPHATE 10 MG/ML IJ SOLN
INTRAMUSCULAR | Status: AC
  Filled 2018-12-26: qty 1

## 2018-12-26 MED ORDER — POTASSIUM CHLORIDE 10 MEQ/100ML IV SOLN
10.0000 meq | Freq: Once | INTRAVENOUS | Status: AC
  Administered 2018-12-26: 16:00:00 10 meq via INTRAVENOUS
  Filled 2018-12-26: qty 100

## 2018-12-26 MED ORDER — DIPHENHYDRAMINE HCL 50 MG/ML IJ SOLN
25.0000 mg | Freq: Once | INTRAMUSCULAR | Status: AC
  Administered 2018-12-26: 10:00:00 25 mg via INTRAVENOUS

## 2018-12-26 MED ORDER — PALONOSETRON HCL INJECTION 0.25 MG/5ML
0.2500 mg | Freq: Once | INTRAVENOUS | Status: AC
  Administered 2018-12-26: 10:00:00 0.25 mg via INTRAVENOUS

## 2018-12-26 MED ORDER — PALONOSETRON HCL INJECTION 0.25 MG/5ML
INTRAVENOUS | Status: AC
  Filled 2018-12-26: qty 5

## 2018-12-26 MED ORDER — PEGFILGRASTIM 6 MG/0.6ML ~~LOC~~ PSKT
6.0000 mg | PREFILLED_SYRINGE | Freq: Once | SUBCUTANEOUS | Status: AC
  Administered 2018-12-26: 16:00:00 6 mg via SUBCUTANEOUS

## 2018-12-26 MED ORDER — ACYCLOVIR 200 MG PO CAPS: 200.0000 mg | ORAL_CAPSULE | Freq: Three times a day (TID) | ORAL | 4 refills | Status: DC

## 2018-12-26 MED ORDER — HEPARIN SOD (PORK) LOCK FLUSH 100 UNIT/ML IV SOLN
500.0000 [IU] | Freq: Once | INTRAVENOUS | Status: AC | PRN
  Administered 2018-12-26: 17:00:00 500 [IU]
  Filled 2018-12-26: qty 5

## 2018-12-26 MED ORDER — ACETAMINOPHEN 325 MG PO TABS
650.0000 mg | ORAL_TABLET | Freq: Once | ORAL | Status: AC
  Administered 2018-12-26: 10:00:00 650 mg via ORAL

## 2018-12-26 MED ORDER — ACETAMINOPHEN 325 MG PO TABS
ORAL_TABLET | ORAL | Status: AC
  Filled 2018-12-26: qty 2

## 2018-12-26 MED ORDER — SODIUM CHLORIDE 0.9 % IV SOLN
1000.0000 mg | Freq: Once | INTRAVENOUS | Status: AC
  Administered 2018-12-26: 12:00:00 1000 mg via INTRAVENOUS
  Filled 2018-12-26: qty 40

## 2018-12-26 NOTE — Progress Notes (Signed)
Pt reports that she has not been taking her OTC Potassium within the last week. MD notified. Per MD [name], give IVPB Potassium today (see labs). Pt instructed to resume taking her Potassium tablet daily. Pt verbalizes understanding and agrees with plan of care.

## 2018-12-26 NOTE — Patient Instructions (Addendum)
**REMOVE ONPRO AFTER 8:30PM ON Saturday 12/27/2018. **  Coronavirus (COVID-19) Are you at risk?  Are you at risk for the Coronavirus (COVID-19)?  To be considered HIGH RISK for Coronavirus (COVID-19), you have to meet the following criteria:  . Traveled to [place], [place], [place], [place] or [place]; or in the [place] to [place], [place], [place], or [place]; and have fever, cough, and shortness of breath within the last 2 weeks of travel OR . Been in close contact with a person diagnosed with COVID-19 within the last 2 weeks and have fever, cough, and shortness of breath . IF YOU DO NOT MEET THESE CRITERIA, YOU ARE CONSIDERED LOW RISK FOR COVID-19.  What to do if you are HIGH RISK for COVID-19?  Marland Kitchen If you are having a medical emergency, call 911. . Seek medical care right away. Before you go to a doctor's office, urgent care or emergency department, call ahead and tell them about your recent travel, contact with someone diagnosed with COVID-19, and your symptoms. You should receive instructions from your physician's office regarding next steps of care.  . When you arrive at healthcare provider, tell the healthcare staff immediately you have returned from visiting [place], [place], [place], [place] or [place]; or traveled in the [place] to [place], [place], [place], or [place]; in the last two weeks or you have been in close contact with a person diagnosed with COVID-19 in the last 2 weeks.   . Tell the health care staff about your symptoms: fever, cough and shortness of breath. . After you have been seen by a medical provider, you will be either: o Tested for (COVID-19) and discharged home on quarantine except to seek medical care if symptoms worsen, and asked to  - Stay home and avoid contact with others until you get your results (4-5 days)  - Avoid travel on public transportation if possible (such as bus, train, or airplane) or o Sent to the Emergency Department by  EMS for evaluation, COVID-19 testing, and possible admission depending on your condition and test results.  What to do if you are LOW RISK for COVID-19?  Reduce your risk of any infection by using the same precautions used for avoiding the common cold or flu:  Marland Kitchen Wash your hands often with soap and warm water for at least 20 seconds.  If soap and water are not readily available, use an alcohol-based hand sanitizer with at least 60% alcohol.  . If coughing or sneezing, cover your mouth and nose by coughing or sneezing into the elbow areas of your shirt or coat, into a tissue or into your sleeve (not your hands). . Avoid shaking hands with others and consider head nods or verbal greetings only. . Avoid touching your eyes, nose, or mouth with unwashed hands.  . Avoid close contact with people who are sick. . Avoid places or events with large numbers of people in one location, like concerts or sporting events. . Carefully consider travel plans you have or are making. . If you are planning any travel outside or inside the [place], visit the CDC's Travelers' Health webpage for the latest health notices. . If you have some symptoms but not all symptoms, continue to monitor at home and seek medical attention if your symptoms worsen. . If you are having a medical emergency, call 911.   ADDITIONAL HEALTHCARE OPTIONS FOR PATIENTS  [facility] / e-Visit: [email or web address]  [facility]: [facility]: [phone]                   [facility]: [facility] Discharge Instructions for Patients Receiving Chemotherapy  Today you received the following chemotherapy agents Adriamycin, Cytoxan and Gazyva  To help prevent nausea and vomiting after your treatment, we encourage you to take your nausea medication as directed.    If you develop nausea and vomiting that is  not controlled by your nausea medication, call the clinic.   BELOW ARE SYMPTOMS THAT SHOULD BE REPORTED IMMEDIATELY:  *FEVER GREATER THAN 100.5 F  *CHILLS WITH OR WITHOUT FEVER  NAUSEA AND VOMITING THAT IS NOT CONTROLLED WITH YOUR NAUSEA MEDICATION  *UNUSUAL SHORTNESS OF BREATH  *UNUSUAL BRUISING OR BLEEDING  TENDERNESS IN MOUTH AND THROAT WITH OR WITHOUT PRESENCE OF ULCERS  *URINARY PROBLEMS  *BOWEL PROBLEMS  UNUSUAL RASH Items with * indicate a potential emergency and should be followed up as soon as possible.  Feel free to call the clinic should you have any questions or concerns. The clinic phone number is [phone].  Please show the [facility] at check-in to the Emergency Department and triage nurse.

## 2018-12-27 LAB — IGG, IGA, IGM
IgA: 31 mg/dL — ABNORMAL LOW (ref 87–352)
IgG (Immunoglobin G), Serum: 1453 mg/dL (ref 586–1602)
IgM (Immunoglobulin M), Srm: <5 mg/dL — ABNORMAL LOW (ref 26–217)

## 2018-12-28 ENCOUNTER — Other Ambulatory Visit: Payer: Self-pay | Admitting: Oncology

## 2018-12-29 ENCOUNTER — Ambulatory Visit: Payer: Medicare Other | Admitting: Gastroenterology

## 2018-12-30 ENCOUNTER — Telehealth: Payer: Self-pay | Admitting: *Deleted

## 2018-12-30 NOTE — Telephone Encounter (Signed)
This RN spoke with pt per her inquiry regarding how long she is to use the Cipro prescribed at visit last Friday.  Informed pt - Cipro prescribed for boil noted on arm - and per inquiry - Maria [name] states improvement in boil.  Per MD pt is to stay on it for at least 7 days ( 4/23 ) and then call with update- if needed MD will continue.  Maria [name] states the Cipro has also benefited her bowels - she was having constipation with some blood ( known history of hemorrhoids). She is having more normal stools with ease and without bleeding.  No other issues at this time.

## 2019-01-01 ENCOUNTER — Ambulatory Visit: Payer: Medicare Other | Admitting: Nurse Practitioner

## 2019-01-12 ENCOUNTER — Other Ambulatory Visit: Payer: Self-pay | Admitting: Oncology

## 2019-01-12 NOTE — Telephone Encounter (Signed)
Order called to [facility] by this nurse with report of Eprescribing error.

## 2019-01-14 ENCOUNTER — Ambulatory Visit: Payer: Medicare Other | Admitting: Oncology

## 2019-01-14 ENCOUNTER — Other Ambulatory Visit: Payer: Medicare Other

## 2019-01-14 ENCOUNTER — Ambulatory Visit: Payer: Medicare Other

## 2019-01-15 ENCOUNTER — Ambulatory Visit: Payer: Medicare Other

## 2019-01-15 ENCOUNTER — Telehealth: Payer: Self-pay | Admitting: Oncology

## 2019-01-15 NOTE — Progress Notes (Signed)
[place]  [phone] [phone]    ID: [name]   DOB: 03-25-1951  MR#: [id number]  ATF#:[id number]  Patient Care Team: Marton Redwood, MD as PCP - General (Internal Medicine) [name], MD as Consulting Physician (Orthopedic Surgery) , [name] Dad, MD as Consulting Physician (Oncology)   CHIEF COMPLAINT: Small lymphocytic lymphoma  CURRENT TREATMENT: CHOP/obinutuzumab   I connected with Maria [name] on 01/16/19 at 10:00 AM EDT by video enabled telemedicine visit and verified that I am speaking with the correct person using two identifiers.   I discussed the limitations, risks, security and privacy concerns of performing an evaluation and management service by telemedicine and the availability of in-person appointments. I also discussed with the patient that there may be a patient responsible charge related to this service. The patient expressed understanding and agreed to proceed.   Other persons participating in the visit and their role in the encounter:   - Biomedical scientist, Medical Scribe   Patient's location: home  Provider's location: [facility]   Chief Complaint: chronic lymphoid leukemia/small cell lymphocytic lymphoma    INTERVAL HISTORY: Maria [name] was seen today for follow-up and treatment of her chronic lymphoid leukemia/small cell lymphocytic lymphoma.   She continues on CHOP chemotherapy. Today is day 1 cycle 3. She notes some good days and some bad days.   Since her last visit here, she has not undergone any additional studies.     REVIEW OF SYSTEMS: Maria [name] notes that she has been working to get stronger; she has been trying to walk up and down the stairs every day to strengthen her legs. She notes some continued swelling from her bilateral knee to ankle with erythema that are warm to the touch. She doesn't believe that she has had a fever, but she does not own a thermometer. She  notes that she went to the post office and was surprised how many people weren't wearing a mask amid the COVID-19 concerns. Her sister has been helping her with groceries to prevent the spread to [place]. She notes some swelling in her left neck lymph nodes. The boil on her right bicep has not resolved yet. The patient denies unusual headaches, visual changes, nausea, vomiting, or dizziness. There has been no unusual cough, phlegm production, or pleurisy. This been no change in bowel or bladder habits. The patient denies unexplained weight loss, bleeding, rash, or fever. A detailed review of systems was otherwise noncontributory.    HISTORY OF PRESENT ILLNESS: From the original intake note:  Maria [name] developed what she thought was "the flu" in December of 2002. She noted a large lymph node developing in her right anterior cervical area. 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   Her subsequent history is as detailed above.   PAST MEDICAL HISTORY: Past Medical History:  Diagnosis Date  . Allergy   . Arthritis   . Asthma   . Cancer [facility])    chronic lymphacytic leukemia  . CLL (chronic lymphocytic leukemia) ([place])   . Diabetes mellitus    PMH  . Headache    migraine  . Hyperlipidemia   . Hypertension   . Hypothyroidism   . Lymphoma ([place])   . Thyroid disease    hypothyroidism  . Wears glasses     PAST SURGICAL HISTORY: Past Surgical History:  Procedure Laterality Date  . ABDOMINAL HYSTERECTOMY    . AXILLARY LYMPH NODE  BIOPSY Right 08/21/2018   Procedure: RIGHT AXILLARY LYMPH NODE BIOPSY ERAS PATHWAY;  Surgeon: [name], MD;  Location: [facility];  Service: General;  Laterality: Right;  LMA  . BREAST EXCISIONAL BIOPSY Right   . COLONOSCOPY    . IR IMAGING GUIDED PORT INSERTION  11/10/2018  . LYMPH NODE DISSECTION    . STRABISMUS SURGERY    . TONSILLECTOMY    . TOTAL KNEE ARTHROPLASTY Right  12/03/2016   Procedure: RIGHT TOTAL KNEE ARTHROPLASTY;  Surgeon: [name], MD;  Location: [facility];  Service: Orthopedics;  Laterality: Right;    FAMILY HISTORY: Family History  Problem Relation Age of Onset  . Liver cancer Mother   . Heart disease Father   . Breast cancer Sister 61  . Colon cancer Neg Hx   . Pancreatic cancer Neg Hx   . Rectal cancer Neg Hx   . Stomach cancer Neg Hx    The patient's father died at the age of 61 from a myocardial infarction. The patient's mother died at the age of 1 from primary liver carcinoma. The patient had no brothers. Her one sister, Maria [name], has a history of breast cancer   GYNECOLOGIC HISTORY: Menarche at around age 58. The patient is GX P0. 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 She lives by herself with her cats [place] and [place]. She attends a Investment banker, operational.              ADVANCED DIRECTIVES: Not in place   HEALTH MAINTENANCE: Social History   Socioeconomic History  . Marital status: Single    Spouse name: Not on file  . Number of children: Not on file  . Years of education: Not on file  . Highest education level: Not on file  Occupational History  . Not on file  Social Needs  . Financial resource strain: Not hard at all  . Food insecurity:    Worry: Never true    Inability: Never true  . Transportation needs:    Medical: No    Non-medical: No  Tobacco Use  . Smoking status: Never Smoker  . Smokeless tobacco: Never Used  Substance and Sexual Activity  . Alcohol use: Yes    Comment: occasional alcohol intake; once or twice monthly  . Drug use: No  . Sexual activity: Not on file  Lifestyle  . Physical activity:    Days per week: 0 days    Minutes per session: 0 min  . Stress: Not at all  Relationships  . Social  connections:    Talks on phone: Once a week    Gets together: Once a week    Attends religious service: Never    Active member of club or organization: No    Attends meetings of clubs or organizations: Never    Relationship status: Never married  . Intimate partner violence:    Fear of current or ex partner: No    Emotionally abused: No    Physically abused: No    Forced sexual activity: No  Other Topics Concern  . Not on file  Social History Narrative  . Not on file                Colonoscopy: 06/01/2014/[facility]             PAP:  Bone density:             Lipid panel:  228/114/50/155/ratio 4.6  On 10/01/2016  ALLERGIES: Allergies  Allergen Reactions  . Iohexol Swelling and Rash  . Compazine [Prochlorperazine Edisylate] Other (See Comments)    Mild confusion  . Contrast Media  [Iodinated Diagnostic Agents]   . Lactose Intolerance (Gi)     Diarrhea, gas bloating  . Lactulose Diarrhea and Other (See Comments)    gas bloating  . Cephalexin Rash    CURRENT MEDICATIONS: Current Outpatient Medications  Medication Sig Dispense Refill  . acetaminophen (TYLENOL 8 HOUR ARTHRITIS PAIN) 650 MG CR tablet Take 650 mg by mouth every 8 (eight) hours as needed for pain.    Marland Kitchen acyclovir (ZOVIRAX) 200 MG capsule Take 1 capsule (200 mg total) by mouth 3 (three) times daily. 90 capsule 4  . allopurinol (ZYLOPRIM) 300 MG tablet Take 1 tablet (300 mg total) by mouth daily. 90 tablet 4  . ciprofloxacin (CIPRO) 500 MG tablet Take 1 tablet (500 mg total) by mouth 2 (two) times daily. 40 tablet 0  . furosemide (LASIX) 20 MG tablet TAKE 1 TABLET BY MOUTH EVERY DAY 90 tablet 1  . hydrochlorothiazide (HYDRODIURIL) 12.5 MG tablet Take 12.5 mg by mouth daily.    . hydrocortisone (ANUSOL-HC) 2.5 % rectal cream Place rectally 3 (three) times daily. 30 g 0  . KLOR-CON M20 20 MEQ tablet TAKE 1 TABLET BY MOUTH EVERY DAY 90 tablet 1  . lidocaine-prilocaine (EMLA) cream Apply to affected area once  30 g 3  . predniSONE (DELTASONE) 20 MG tablet Take 3 tablets (60 mg total) by mouth daily with breakfast. Take days 1-5 starting on CHOP chemo day 15 tablet 6  . rosuvastatin (CRESTOR) 10 MG tablet Take 10 mg by mouth at bedtime.  11  . SYNTHROID 88 MCG tablet Take 88 mcg by mouth daily before breakfast.      No current facility-administered medications for this visit.     OBJECTIVE: Middle-aged white woman in no acute distress There were no vitals filed for this visit.   ECOG FS: 2 - Symptomatic, <50% confined to bed  LAB RESULTS: Lab Results  Component Value Date   WBC 7.3 12/26/2018   NEUTROABS 5.3 12/26/2018   HGB 10.6 (L) 12/26/2018   HCT 33.4 (L) 12/26/2018   MCV 91.5 12/26/2018   PLT 127 (L) 12/26/2018   CMP Latest Ref Rng & Units 12/26/2018 12/17/2018 12/03/2018  Glucose 70 - 99 mg/dL 128(H) 170(H) 209(H)  BUN 8 - 23 mg/dL 17 21 26(H)  Creatinine 0.44 - 1.00 mg/dL 1.26(H) 0.92 0.83  Sodium 135 - 145 mmol/L 136 133(L) 131(L)  Potassium 3.5 - 5.1 mmol/L 3.0(LL) 3.9 4.4  Chloride 98 - 111 mmol/L 99 98 105  CO2 22 - 32 mmol/L 26 26 22   Calcium 8.9 - 10.3 mg/dL 8.5(L) 8.5(L) 6.8(L)  Total Protein 6.5 - 8.1 g/dL 6.8 5.9(L) 5.4(L)  Total Bilirubin 0.3 - 1.2 mg/dL 0.6 1.4(H) 0.7  Alkaline Phos 38 - 126 U/L 91 84 65  AST 15 - 41 U/L 12(L) 9(L) 12(L)  ALT 0 - 44 U/L <6 <6 11    No results for input(s): LABCA2 in the last 72 hours.   STUDIES: No results found.   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             (b) rituximab added to ibrutinib, first dose 08/27/2018             (c) hepatitis B studies 08/27/2018 negative             (f) ibrutinib/rituximab discontinued after 10/02/2018 dose w/o obvious response  (6) bendamustine/rituximab started 10/22/2018, discontinued after 1 cycle with rapid progression  (7) obinutuzumab/Gaziva started 11/11/2018  (a) second dose given 11/19/2018 together with chemotherapy  (b) third dose given 12/26/2018 and subsequent doses with chemotherapy  (8) CH[O]P chemotherapy started 11/19/2018             (a) echocardiogram on 11/07/2018 shows EF of 55-60%  (b) second cycle of CH[O]P postponed because of pandemic, given 12/26/2018  (9) severe immunocompromise: Marked hypogammaglobulinemia  (a) received IVIG 12/02/2018, repeated 12/17/2018   PLAN:  Ashli is clinically stable.  She is taking some risks and going out to the post office for example which I do not recommend.  Also she does not have a thermometer at home.  She absolutely needs to get 1 so she can call us and let us know her temperature if she develops anything suggestive of a fever.  I have encouraged her to exercise on a regular basis, which she can easily do by going up and down stairs in her own home.  Lymph nodes seem to be stable, certainly not markedly worse per her own exam  She is returning in about 2 weeks for her next cycle of chemotherapy and immunotherapy.  The plan is to proceed to another 2 cycles and then reassess.  If we are  not getting the results we want we will have to consider venetoclax.  I have encouraged her to call us for any other issues that may develop before the next visit.  , [name] Dad, MD  12/05/18 11:16 AM Medical Oncology and Hematology [facility] [address] [place], [place] [address] Tel. [phone] Fax. [phone]  I, Jacqualyn Posey am acting as a Education administrator for Chauncey Cruel, MD.   I, Lurline Del MD, have reviewed the above documentation for accuracy and completeness, and I agree with the above.

## 2019-01-15 NOTE — Telephone Encounter (Signed)
Spoke with patient and she's agreed to [employer] for 01/16/19. Emailed step-by-step instructions how to download and access [employer] applications; she will call if she has any questions.

## 2019-01-16 ENCOUNTER — Inpatient Hospital Stay: Payer: Medicare Other | Attending: Oncology | Admitting: Oncology

## 2019-01-16 DIAGNOSIS — Z5111 Encounter for antineoplastic chemotherapy: Secondary | ICD-10-CM | POA: Insufficient documentation

## 2019-01-16 DIAGNOSIS — Z79899 Other long term (current) drug therapy: Secondary | ICD-10-CM

## 2019-01-16 DIAGNOSIS — C9110 Chronic lymphocytic leukemia of B-cell type not having achieved remission: Secondary | ICD-10-CM | POA: Insufficient documentation

## 2019-01-16 DIAGNOSIS — Z5112 Encounter for antineoplastic immunotherapy: Secondary | ICD-10-CM | POA: Insufficient documentation

## 2019-01-16 DIAGNOSIS — Z5189 Encounter for other specified aftercare: Secondary | ICD-10-CM | POA: Insufficient documentation

## 2019-02-03 ENCOUNTER — Inpatient Hospital Stay (HOSPITAL_BASED_OUTPATIENT_CLINIC_OR_DEPARTMENT_OTHER): Payer: Medicare Other | Admitting: Oncology

## 2019-02-03 ENCOUNTER — Inpatient Hospital Stay: Payer: Medicare Other

## 2019-02-03 ENCOUNTER — Other Ambulatory Visit: Payer: Self-pay

## 2019-02-03 VITALS — BP 133/57 | HR 89 | Temp 98.0°F | Resp 17 | Ht 62.0 in | Wt 181.9 lb

## 2019-02-03 VITALS — BP 132/57 | HR 79 | Temp 97.7°F | Resp 16

## 2019-02-03 DIAGNOSIS — D801 Nonfamilial hypogammaglobulinemia: Secondary | ICD-10-CM | POA: Diagnosis not present

## 2019-02-03 DIAGNOSIS — K5900 Constipation, unspecified: Secondary | ICD-10-CM | POA: Diagnosis not present

## 2019-02-03 DIAGNOSIS — L02424 Furuncle of left upper limb: Secondary | ICD-10-CM | POA: Diagnosis not present

## 2019-02-03 DIAGNOSIS — C9110 Chronic lymphocytic leukemia of B-cell type not having achieved remission: Secondary | ICD-10-CM

## 2019-02-03 DIAGNOSIS — Z5111 Encounter for antineoplastic chemotherapy: Secondary | ICD-10-CM | POA: Diagnosis present

## 2019-02-03 DIAGNOSIS — D696 Thrombocytopenia, unspecified: Secondary | ICD-10-CM

## 2019-02-03 DIAGNOSIS — Z5189 Encounter for other specified aftercare: Secondary | ICD-10-CM | POA: Diagnosis not present

## 2019-02-03 DIAGNOSIS — Z5112 Encounter for antineoplastic immunotherapy: Secondary | ICD-10-CM | POA: Diagnosis not present

## 2019-02-03 LAB — COMPREHENSIVE METABOLIC PANEL
ALT: <6 U/L (ref 0–44)
AST: 14 U/L — ABNORMAL LOW (ref 15–41)
Albumin: 3.8 g/dL (ref 3.5–5.0)
Alkaline Phosphatase: 71 U/L (ref 38–126)
Anion gap: 10 (ref 5–15)
BUN: 15 mg/dL (ref 8–23)
CO2: 25 mmol/L (ref 22–32)
Calcium: 9.2 mg/dL (ref 8.9–10.3)
Chloride: 105 mmol/L (ref 98–111)
Creatinine, Ser: 1.23 mg/dL — ABNORMAL HIGH (ref 0.44–1.00)
GFR calc Af Amer: 52 mL/min — ABNORMAL LOW (ref >60)
GFR calc non Af Amer: 45 mL/min — ABNORMAL LOW (ref >60)
Glucose, Bld: 173 mg/dL — ABNORMAL HIGH (ref 70–99)
Potassium: 3.9 mmol/L (ref 3.5–5.1)
Sodium: 140 mmol/L (ref 135–145)
Total Bilirubin: 0.6 mg/dL (ref 0.3–1.2)
Total Protein: 6.2 g/dL — ABNORMAL LOW (ref 6.5–8.1)

## 2019-02-03 LAB — CBC WITH DIFFERENTIAL/PLATELET
Abs Immature Granulocytes: 0 10*3/uL (ref 0.00–0.07)
Band Neutrophils: 1 %
Basophils Absolute: 0.2 10*3/uL — ABNORMAL HIGH (ref 0.0–0.1)
Basophils Relative: 2 %
Eosinophils Absolute: 0.4 10*3/uL (ref 0.0–0.5)
Eosinophils Relative: 5 %
HCT: 36.9 % (ref 36.0–46.0)
Hemoglobin: 11.8 g/dL — ABNORMAL LOW (ref 12.0–15.0)
Lymphocytes Relative: 47 %
Lymphs Abs: 3.5 10*3/uL (ref 0.7–4.0)
MCH: 31.5 pg (ref 26.0–34.0)
MCHC: 32 g/dL (ref 30.0–36.0)
MCV: 98.4 fL (ref 80.0–100.0)
Monocytes Absolute: 0.4 10*3/uL (ref 0.1–1.0)
Monocytes Relative: 5 %
Neutro Abs: 3.1 10*3/uL (ref 1.7–17.7)
Neutrophils Relative %: 40 %
Platelets: 119 10*3/uL — ABNORMAL LOW (ref 150–400)
RBC: 3.75 MIL/uL — ABNORMAL LOW (ref 3.87–5.11)
RDW: 16.2 % — ABNORMAL HIGH (ref 11.5–15.5)
WBC: 7.5 10*3/uL (ref 4.0–10.5)
nRBC: 0 % (ref 0.0–0.2)

## 2019-02-03 LAB — LACTATE DEHYDROGENASE: LDH: 438 U/L — ABNORMAL HIGH (ref 98–192)

## 2019-02-03 MED ORDER — SODIUM CHLORIDE 0.9 % IV SOLN
Freq: Once | INTRAVENOUS | Status: AC
  Administered 2019-02-03: 10:00:00 via INTRAVENOUS
  Filled 2019-02-03: qty 5

## 2019-02-03 MED ORDER — ACETAMINOPHEN 325 MG PO TABS
650.0000 mg | ORAL_TABLET | Freq: Once | ORAL | Status: AC
  Administered 2019-02-03: 650 mg via ORAL

## 2019-02-03 MED ORDER — ACETAMINOPHEN 325 MG PO TABS
ORAL_TABLET | ORAL | Status: AC
  Filled 2019-02-03: qty 2

## 2019-02-03 MED ORDER — HEPARIN SOD (PORK) LOCK FLUSH 100 UNIT/ML IV SOLN
500.0000 [IU] | Freq: Once | INTRAVENOUS | Status: AC | PRN
  Administered 2019-02-03: 17:00:00 500 [IU]
  Filled 2019-02-03: qty 5

## 2019-02-03 MED ORDER — PEGFILGRASTIM 6 MG/0.6ML ~~LOC~~ PSKT
6.0000 mg | PREFILLED_SYRINGE | Freq: Once | SUBCUTANEOUS | Status: AC
  Administered 2019-02-03: 6 mg via SUBCUTANEOUS

## 2019-02-03 MED ORDER — SODIUM CHLORIDE 0.9 % IV SOLN
500.0000 mg/m2 | Freq: Once | INTRAVENOUS | Status: AC
  Administered 2019-02-03: 12:00:00 1000 mg via INTRAVENOUS
  Filled 2019-02-03: qty 50

## 2019-02-03 MED ORDER — DOXYCYCLINE HYCLATE 100 MG PO TABS: 100.0000 mg | ORAL_TABLET | Freq: Every day | ORAL | 2 refills | Status: DC

## 2019-02-03 MED ORDER — PALONOSETRON HCL INJECTION 0.25 MG/5ML
0.2500 mg | Freq: Once | INTRAVENOUS | Status: AC
  Administered 2019-02-03: 10:00:00 0.25 mg via INTRAVENOUS

## 2019-02-03 MED ORDER — DOXORUBICIN HCL CHEMO IV INJECTION 2 MG/ML
30.0000 mg/m2 | Freq: Once | INTRAVENOUS | Status: AC
  Administered 2019-02-03: 12:00:00 60 mg via INTRAVENOUS
  Filled 2019-02-03: qty 30

## 2019-02-03 MED ORDER — PALONOSETRON HCL INJECTION 0.25 MG/5ML
INTRAVENOUS | Status: AC
  Filled 2019-02-03: qty 5

## 2019-02-03 MED ORDER — SODIUM CHLORIDE 0.9 % IV SOLN
Freq: Once | INTRAVENOUS | Status: AC
  Administered 2019-02-03: 10:00:00 via INTRAVENOUS
  Filled 2019-02-03: qty 250

## 2019-02-03 MED ORDER — SODIUM CHLORIDE 0.9% FLUSH
10.0000 mL | INTRAVENOUS | Status: DC | PRN
  Administered 2019-02-03: 10 mL
  Filled 2019-02-03: qty 10

## 2019-02-03 MED ORDER — SODIUM CHLORIDE 0.9 % IV SOLN
1000.0000 mg | Freq: Once | INTRAVENOUS | Status: AC
  Administered 2019-02-03: 1000 mg via INTRAVENOUS
  Filled 2019-02-03: qty 40

## 2019-02-03 MED ORDER — PEGFILGRASTIM 6 MG/0.6ML ~~LOC~~ PSKT
PREFILLED_SYRINGE | SUBCUTANEOUS | Status: AC
  Filled 2019-02-03: qty 0.6

## 2019-02-03 MED ORDER — DIPHENHYDRAMINE HCL 50 MG/ML IJ SOLN
25.0000 mg | Freq: Once | INTRAMUSCULAR | Status: AC
  Administered 2019-02-03: 11:00:00 25 mg via INTRAVENOUS

## 2019-02-03 MED ORDER — DIPHENHYDRAMINE HCL 50 MG/ML IJ SOLN
INTRAMUSCULAR | Status: AC
  Filled 2019-02-03: qty 1

## 2019-02-03 NOTE — Progress Notes (Addendum)
[facility]  [phone] [phone]    ID: LESTA LIMBERT   DOB: 1950/10/29  MR#: [id number]  LPF#:[id number]  Patient Care Team: Marton Redwood, MD as PCP - General (Internal Medicine) [name], MD as Consulting Physician (Orthopedic Surgery) [name], [name] Dad, MD as Consulting Physician (Oncology) OTHER MD:   CHIEF COMPLAINT: Small lymphocytic lymphoma  CURRENT TREATMENT: CHOP/obinutuzumab   INTERVAL HISTORY: Yittel was seen today for follow-up and treatment of her chronic lymphoid leukemia/small cell lymphocytic lymphoma.   She continues on CHOP chemotherapy. Today is day 1 cycle 3. She denies fatigue and rash. She notes easily bruising.  She also receives obinutuzumab.  She has had no unusual reactions to this medication.  Since her last visit here, she has not undergone any additional studies.     REVIEW OF SYSTEMS: Kynslie reports doing okay overall. She continues to stay home. She states some constipation; she takes Miralax and has small bowel movements 2-3 times a day. She continues to experience some swelling in her left neck lymph nodes.  She has had no fever, unexplained fatigue, unexplained weight loss.  She does have progressive adenopathy.  This is chiefly apparent in the left neck area.  The patient denies unusual headaches, visual changes, nausea, vomiting, stiff neck, dizziness, or gait imbalance. There has been no cough, phlegm production, or pleurisy, no chest pain or pressure, and no change in bladder habits. The patient denies fever, rash, bleeding, unexplained fatigue or unexplained weight loss. A detailed review of systems was otherwise entirely negative.   HISTORY OF PRESENT ILLNESS: From the original intake note:  [name] developed what she thought was "the flu" in December of 2002. She noted a large lymph node developing in her right anterior cervical area. She was treated with antibiotics x2 before  the tumor was eventually biopsied and shown to be chronic lymphoid leukemia.   Her subsequent history is as detailed above.   PAST MEDICAL HISTORY: Past Medical History:  Diagnosis Date  . Allergy   . Arthritis   . Asthma   . Cancer [facility])    chronic lymphacytic leukemia  . CLL (chronic lymphocytic leukemia) ([place])   . Diabetes mellitus    PMH  . Headache    migraine  . Hyperlipidemia   . Hypertension   . Hypothyroidism   . Lymphoma ([place])   . Thyroid disease    hypothyroidism  . Wears glasses     PAST SURGICAL HISTORY: Past Surgical History:  Procedure Laterality Date  . ABDOMINAL HYSTERECTOMY    . AXILLARY LYMPH NODE BIOPSY Right 08/21/2018   Procedure: RIGHT AXILLARY LYMPH NODE BIOPSY ERAS PATHWAY;  Surgeon: [name], MD;  Location: [facility];  Service: General;  Laterality: Right;  LMA  . BREAST EXCISIONAL BIOPSY Right   . COLONOSCOPY    . IR IMAGING GUIDED PORT INSERTION  11/10/2018  . LYMPH NODE DISSECTION    . STRABISMUS SURGERY    . TONSILLECTOMY    . TOTAL KNEE ARTHROPLASTY Right 12/03/2016   Procedure: RIGHT TOTAL KNEE ARTHROPLASTY;  Surgeon: [name], MD;  Location: [facility];  Service: Orthopedics;  Laterality: Right;    FAMILY HISTORY: Family History  Problem Relation Age of Onset  . Liver cancer Mother   . Heart disease Father   . Breast cancer Sister 68  . Colon cancer Neg Hx   . Pancreatic cancer Neg Hx   . Rectal cancer Neg Hx   . Stomach cancer Neg Hx  The patient's father died at the age of 62 from a myocardial infarction. The patient's mother died at the age of 32 from primary liver carcinoma. The patient had no brothers. Her one sister, [name], has a history of breast cancer   GYNECOLOGIC HISTORY: Menarche at around age 76. The patient is GX P0. She underwent simple hysterectomy without salpingo-oophorectomy in 1997    SOCIAL HISTORY: (Updated November 2018) She reports that she recently retired from being a Therapist, sports at  [employer], working in the [place], [employer] x 5d/week. She lives by herself with her cats [place] and Aline. She attends a Investment banker, operational.              ADVANCED DIRECTIVES: Completed 02/03/2019. She has named her niece her 39.   HEALTH MAINTENANCE: Social History   Socioeconomic History  . Marital status: Single    Spouse name: Not on file  . Number of children: Not on file  . Years of education: Not on file  . Highest education level: Not on file  Occupational History  . Not on file  Social Needs  . Financial resource strain: Not hard at all  . Food insecurity:    Worry: Never true    Inability: Never true  . Transportation needs:    Medical: No    Non-medical: No  Tobacco Use  . Smoking status: Never Smoker  . Smokeless tobacco: Never Used  Substance and Sexual Activity  . Alcohol use: Yes    Comment: occasional alcohol intake; once or twice monthly  . Drug use: No  . Sexual activity: Not on file  Lifestyle  . Physical activity:    Days per week: 0 days    Minutes per session: 0 min  . Stress: Not at all  Relationships  . Social connections:    Talks on phone: Once a week    Gets together: Once a week    Attends religious service: Never    Active member of club or organization: No    Attends meetings of clubs or organizations: Never    Relationship status: Never married  . Intimate partner violence:    Fear of current or ex partner: No    Emotionally abused: No    Physically abused: No    Forced sexual activity: No  Other Topics Concern  . Not on file  Social History Narrative  . Not on file                Colonoscopy: 06/01/2014/[facility]             PAP:             Bone density:             Lipid panel:  228/114/50/155/ratio 4.6  On 10/01/2016  ALLERGIES: Allergies  Allergen Reactions  . Iohexol Swelling and Rash  . Compazine [Prochlorperazine Edisylate] Other (See Comments)    Mild confusion  . Contrast Media  [Iodinated Diagnostic Agents]   . Lactose  Intolerance (Gi)     Diarrhea, gas bloating  . Lactulose Diarrhea and Other (See Comments)    gas bloating  . Cephalexin Rash    CURRENT MEDICATIONS: Current Outpatient Medications  Medication Sig Dispense Refill  . acetaminophen (TYLENOL 8 HOUR ARTHRITIS PAIN) 650 MG CR tablet Take 650 mg by mouth every 8 (eight) hours as needed for pain.    Marland Kitchen acyclovir (ZOVIRAX) 200 MG capsule Take 1 capsule (200 mg total) by mouth 3 (three) times  daily. 90 capsule 4  . allopurinol (ZYLOPRIM) 300 MG tablet Take 1 tablet (300 mg total) by mouth daily. 90 tablet 4  . ciprofloxacin (CIPRO) 500 MG tablet Take 1 tablet (500 mg total) by mouth 2 (two) times daily. 40 tablet 0  . furosemide (LASIX) 20 MG tablet TAKE 1 TABLET BY MOUTH EVERY DAY 90 tablet 1  . hydrochlorothiazide (HYDRODIURIL) 12.5 MG tablet Take 12.5 mg by mouth daily.    . hydrocortisone (ANUSOL-HC) 2.5 % rectal cream Place rectally 3 (three) times daily. 30 g 0  . KLOR-CON M20 20 MEQ tablet TAKE 1 TABLET BY MOUTH EVERY DAY 90 tablet 1  . lidocaine-prilocaine (EMLA) cream Apply to affected area once 30 g 3  . predniSONE (DELTASONE) 20 MG tablet Take 3 tablets (60 mg total) by mouth daily with breakfast. Take days 1-5 starting on CHOP chemo day 15 tablet 6  . rosuvastatin (CRESTOR) 10 MG tablet Take 10 mg by mouth at bedtime.  11  . SYNTHROID 88 MCG tablet Take 88 mcg by mouth daily before breakfast.      No current facility-administered medications for this visit.     OBJECTIVE: Middle-aged white woman who appears stated age 23:   02/03/19 0823  BP: (!) 133/57  Pulse: 89  Resp: 17  Temp: 98 F (36.7 C)  SpO2: 100%    ECOG FS: 1  Sclerae unicteric, EOMs intact Oropharynx clear and moist Significant left preauricular and supraclavicular adenopathy and left axillary adenopathy.  Minimal right cervical right axillary and no inguinal adenopathy Lungs no rales or rhonchi Heart regular rate and rhythm Abd soft, nontender,  positive bowel sounds MSK no focal spinal tenderness, no upper extremity lymphedema Neuro: nonfocal, well oriented, appropriate affect Breasts: Deferred   LAB RESULTS: Lab Results  Component Value Date   WBC 7.5 02/03/2019   NEUTROABS PENDING 02/03/2019   HGB 11.8 (L) 02/03/2019   HCT 36.9 02/03/2019   MCV 98.4 02/03/2019   PLT 119 (L) 02/03/2019   CMP Latest Ref Rng & Units 12/26/2018 12/17/2018 12/03/2018  Glucose 70 - 99 mg/dL 128(H) 170(H) 209(H)  BUN 8 - 23 mg/dL 17 21 26(H)  Creatinine 0.44 - 1.00 mg/dL 1.26(H) 0.92 0.83  Sodium 135 - 145 mmol/L 136 133(L) 131(L)  Potassium 3.5 - 5.1 mmol/L 3.0(LL) 3.9 4.4  Chloride 98 - 111 mmol/L 99 98 105  CO2 22 - 32 mmol/L 26 26 22   Calcium 8.9 - 10.3 mg/dL 8.5(L) 8.5(L) 6.8(L)  Total Protein 6.5 - 8.1 g/dL 6.8 5.9(L) 5.4(L)  Total Bilirubin 0.3 - 1.2 mg/dL 0.6 1.4(H) 0.7  Alkaline Phos 38 - 126 U/L 91 84 65  AST 15 - 41 U/L 12(L) 9(L) 12(L)  ALT 0 - 44 U/L <6 <6 11    No results for input(s): LABCA2 in the last 72 hours.   STUDIES: No results found.   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             (b) rituximab added to ibrutinib, first dose 08/27/2018             (c) hepatitis B studies 08/27/2018 negative             (f) ibrutinib/rituximab discontinued after 10/02/2018 dose w/o obvious response  (6) bendamustine/rituximab started 10/22/2018, discontinued after 1 cycle with rapid progression  (7) obinutuzumab/Gaziva started 11/11/2018  (a) second dose given 11/19/2018 together with chemotherapy  (b) third dose given 12/26/2018 and subsequent doses with chemotherapy  (8) CH[O]P chemotherapy started 11/19/2018             (a) echocardiogram on 11/07/2018 shows EF of 55-60%  (b) second cycle of CH[O]P postponed because of pandemic, given 12/26/2018  (9) severe immunocompromise: Marked hypogammaglobulinemia  (a) received IVIG 12/02/2018, repeated 12/17/2018   PLAN:  Garyn is generally stable.  Her lymph nodes go down with treatment but increase before the next cycle.  We are repeating treatment today in the hopes that with obinutuzumab we get a better response.    I am going to bring her back on June 5 and if she needs IVIG she will receive IVIG that day.  We will also check nadir counts.  If we do not have a significant response we will have to consider switching treatment.  She may need venetoclax which however would be difficult to give during the pandemic because of the numerous visits required  She has completed her healthcare power of attorney and living will and we have a copy to be scanned  She knows to call for any other issues that may develop before the next visit.  [name], [name] Dad, MD  12/05/18 11:16 AM Medical Oncology and Hematology [facility] [address] [place], [place] [address] Tel. [phone] Fax. [phone]   I, Wilburn Mylar, am acting as scribe for Dr. [name] Dad. [name].  I, Lurline Del MD, have reviewed the above  documentation for accuracy and completeness, and I agree with the above.  ADDENDUM: I inspected the "boil" in the medial aspect of the left upper arm while Amanee was getting her chemo.  It is now completely circumscribed, still slightly erythematous and somewhat raised.  I suspect this is a lymphoma lesion that got infected.  If so it should continue to shrink with the current treatment.  We are going to add doxycycline daily just to make sure it is covered.  Note also that she has canceled [facility] and is using only [facility] her [facility] at present.

## 2019-02-03 NOTE — Addendum Note (Signed)
Addended by: Chauncey Cruel on: 02/03/2019 02:36 PM   Modules accepted: Orders

## 2019-02-03 NOTE — Progress Notes (Signed)
Clarified chemo doses with MD. Increase Cytoxan to 500mg /m2 = 1000mg  and increase Doxorubicin to 30mg /m2 = 60mg  from doses received last cycle. Proceed with Gazyva today as well.  Hardie Pulley, PharmD, BCPS, BCOP

## 2019-02-03 NOTE — Patient Instructions (Addendum)
[place] Discharge Instructions for Patients Receiving Chemotherapy  Today you received the following chemotherapy agents: Adriamycin, Cytoxan and other agent: Gazyva  To help prevent nausea and vomiting after your treatment, we encourage you to take your nausea medication: As directed by MD   If you develop nausea and vomiting that is not controlled by your nausea medication, call the clinic.   BELOW ARE SYMPTOMS THAT SHOULD BE REPORTED IMMEDIATELY:  *FEVER GREATER THAN 100.5 F  *CHILLS WITH OR WITHOUT FEVER  NAUSEA AND VOMITING THAT IS NOT CONTROLLED WITH YOUR NAUSEA MEDICATION  *UNUSUAL SHORTNESS OF BREATH  *UNUSUAL BRUISING OR BLEEDING  TENDERNESS IN MOUTH AND THROAT WITH OR WITHOUT PRESENCE OF ULCERS  *URINARY PROBLEMS  *BOWEL PROBLEMS  UNUSUAL RASH Items with * indicate a potential emergency and should be followed up as soon as possible.  Feel free to call the clinic should you have any questions or concerns. The clinic phone number is [phone].  Please show the [place] at check-in to the Emergency Department and triage nurse.  Coronavirus (COVID-19) Are you at risk?  Are you at risk for the Coronavirus (COVID-19)?  To be considered HIGH RISK for Coronavirus (COVID-19), you have to meet the following criteria:  . Traveled to [place], [place], [place], [place] or [place]; or in the [place] to [place], [place], [place], or [place]; and have fever, cough, and shortness of breath within the last 2 weeks of travel OR . Been in close contact with a person diagnosed with COVID-19 within the last 2 weeks and have fever, cough, and shortness of breath . IF YOU DO NOT MEET THESE CRITERIA, YOU ARE CONSIDERED LOW RISK FOR COVID-19.  What to do if you are HIGH RISK for COVID-19?  Marland Kitchen If you are having a medical emergency, call 911. . Seek medical care right away. Before you go to a doctor's office, urgent care or emergency department,  call ahead and tell them about your recent travel, contact with someone diagnosed with COVID-19, and your symptoms. You should receive instructions from your physician's office regarding next steps of care.  . When you arrive at healthcare provider, tell the healthcare staff immediately you have returned from visiting [place], [place], [place], [place] or [place]; or traveled in the [place] to [place], [place], [place], or [place]; in the last two weeks or you have been in close contact with a person diagnosed with COVID-19 in the last 2 weeks.   . Tell the health care staff about your symptoms: fever, cough and shortness of breath. . After you have been seen by a medical provider, you will be either: o Tested for (COVID-19) and discharged home on quarantine except to seek medical care if symptoms worsen, and asked to  - Stay home and avoid contact with others until you get your results (4-5 days)  - Avoid travel on public transportation if possible (such as bus, train, or airplane) or o Sent to the Emergency Department by EMS for evaluation, COVID-19 testing, and possible admission depending on your condition and test results.  What to do if you are LOW RISK for COVID-19?  Reduce your risk of any infection by using the same precautions used for avoiding the common cold or flu:  Marland Kitchen Wash your hands often with soap and warm water for at least 20 seconds.  If soap and water are not readily available, use an alcohol-based hand sanitizer with at least 60%  alcohol.  . If coughing or sneezing, cover your mouth and nose by coughing or sneezing into the elbow areas of your shirt or coat, into a tissue or into your sleeve (not your hands). . Avoid shaking hands with others and consider head nods or verbal greetings only. . Avoid touching your eyes, nose, or mouth with unwashed hands.  . Avoid close contact with people who are sick. . Avoid places or events with large numbers of people in one  location, like concerts or sporting events. . Carefully consider travel plans you have or are making. . If you are planning any travel outside or inside the [place], visit the CDC's Travelers' Health webpage for the latest health notices. . If you have some symptoms but not all symptoms, continue to monitor at home and seek medical attention if your symptoms worsen. . If you are having a medical emergency, call 911.   [place] / e-Visit: [email or web address]         [facility]: [phone]  [facility]: [phone]                   [facility]: [phone]     Pegfilgrastim injection What is this medicine? PEGFILGRASTIM (PEG fil gra stim) is a long-acting granulocyte colony-stimulating factor that stimulates the growth of neutrophils, a type of white blood cell important in the body's fight against infection. It is used to reduce the incidence of fever and infection in patients with certain types of cancer who are receiving chemotherapy that affects the bone marrow, and to increase survival after being exposed to high doses of radiation. This medicine may be used for other purposes; ask your health care provider or pharmacist if you have questions. COMMON BRAND NAME(S): Domenic Moras, UDENYCA What should I tell my health care provider before I take this medicine? They need to know if you have any of these conditions: -kidney disease -latex allergy -ongoing radiation therapy -sickle cell disease -skin reactions to acrylic adhesives (On-Body Injector only) -an unusual or allergic reaction to pegfilgrastim, filgrastim, other medicines, foods, dyes, or preservatives -pregnant or trying to get pregnant -breast-feeding How should I use this medicine? This medicine is for injection under the skin. If you get this medicine at home, you will be  taught how to prepare and give the pre-filled syringe or how to use the On-body Injector. Refer to the patient Instructions for Use for detailed instructions. Use exactly as directed. Tell your healthcare provider immediately if you suspect that the On-body Injector may not have performed as intended or if you suspect the use of the On-body Injector resulted in a missed or partial dose. It is important that you put your used needles and syringes in a special sharps container. Do not put them in a trash can. If you do not have a sharps container, call your pharmacist or healthcare provider to get one. Talk to your pediatrician regarding the use of this medicine in children. While this drug may be prescribed for selected conditions, precautions do apply. Overdosage: If you think you have taken too much of this medicine contact a poison control center or emergency room at once. NOTE: This medicine is only for you. Do not share this medicine with others. What if I miss a dose? It is important not to miss your dose. Call your doctor or health care professional if you miss your dose. If you miss a dose  due to an On-body Injector failure or leakage, a new dose should be administered as soon as possible using a single prefilled syringe for manual use. What may interact with this medicine? Interactions have not been studied. Give your health care provider a list of all the medicines, herbs, non-prescription drugs, or dietary supplements you use. Also tell them if you smoke, drink alcohol, or use illegal drugs. Some items may interact with your medicine. This list may not describe all possible interactions. Give your health care provider a list of all the medicines, herbs, non-prescription drugs, or dietary supplements you use. Also tell them if you smoke, drink alcohol, or use illegal drugs. Some items may interact with your medicine. What should I watch for while using this medicine? You may need blood work done  while you are taking this medicine. If you are going to need a MRI, CT scan, or other procedure, tell your doctor that you are using this medicine (On-Body Injector only). What side effects may I notice from receiving this medicine? Side effects that you should report to your doctor or health care professional as soon as possible: -allergic reactions like skin rash, itching or hives, swelling of the face, lips, or tongue -back pain -dizziness -fever -pain, redness, or irritation at site where injected -pinpoint red spots on the skin -red or dark-brown urine -shortness of breath or breathing problems -stomach or side pain, or pain at the shoulder -swelling -tiredness -trouble passing urine or change in the amount of urine Side effects that usually do not require medical attention (report to your doctor or health care professional if they continue or are bothersome): -bone pain -muscle pain This list may not describe all possible side effects. Call your doctor for medical advice about side effects. You may report side effects to FDA at 1-800-FDA-1088. Where should I keep my medicine? Keep out of the reach of children. If you are using this medicine at home, you will be instructed on how to store it. Throw away any unused medicine after the expiration date on the label. NOTE: This sheet is a summary. It may not cover all possible information. If you have questions about this medicine, talk to your doctor, pharmacist, or health care provider.  2019 Elsevier/Gold Standard (2017-12-02 16:57:08)

## 2019-02-03 NOTE — Addendum Note (Signed)
Addended by: Chauncey Cruel on: 02/03/2019 01:05 PM   Modules accepted: Orders

## 2019-02-04 ENCOUNTER — Telehealth: Payer: Self-pay | Admitting: Oncology

## 2019-02-04 LAB — IGG, IGA, IGM
IgA: 24 mg/dL — ABNORMAL LOW (ref 87–352)
IgG (Immunoglobin G), Serum: 582 mg/dL — ABNORMAL LOW (ref 586–1602)
IgM (Immunoglobulin M), Srm: <5 mg/dL — ABNORMAL LOW (ref 26–217)

## 2019-02-04 NOTE — Telephone Encounter (Signed)
Called regarding schedule °

## 2019-02-08 ENCOUNTER — Other Ambulatory Visit: Payer: Self-pay | Admitting: Oncology

## 2019-02-12 NOTE — Progress Notes (Signed)
[place]  [phone] Fax:(336) [phone]    ID: Maria [name]   DOB: [birth year]  MR#: [id number]  VPX#:[id number]  Patient Care Team: Marton Redwood, MD as PCP - General (Internal Medicine) [name], MD as Consulting Physician (Orthopedic Surgery) Jennica Tagliaferri, [name] Dad, MD as Consulting Physician (Oncology) OTHER MD:   CHIEF COMPLAINT: Small lymphocytic lymphoma  CURRENT TREATMENT: CHOP/obinutuzumab,, IVIG   INTERVAL HISTORY: Maria [name] returns today for follow-up and treatment of her chronic lymphoid leukemia/small cell lymphocytic lymphoma.   She continues on CHOP/obinutuzumab. Today is day 11 cycle 1. She has been tolerating this treatment well as compared to the previous treatment. She has has no nausea or vomiting.   She also receives IVIG for her severe humoral immunosuppression. Her most recent dose was 12/17/2018, and she has a dose due today.  She has tolerated this well, with no side effects that she is aware of.  Since her last visit here, she has not undergone any additional studies.     REVIEW OF SYSTEMS: Maria [name]'s leg swelling has improved on prednisone. She has been sleeping well at night. Her sister has been doing the grocery shopping for her. She feels like she can get out and walk sometimes, but sometimes she feels too tired to exercise. The patient denies unusual headaches, visual changes, nausea, vomiting, or dizziness. There has been no unusual cough, phlegm production, or pleurisy. This been no change in bowel or bladder habits. The patient denies unexplained weight loss, bleeding, rash, or fever; she does have some mild bruising. A detailed review of systems was otherwise noncontributory.    HISTORY OF PRESENT ILLNESS: From the original intake note:  Maria [name] developed what she thought was "the flu" in December of 2002. She noted a large lymph node developing in her right anterior cervical area. She was treated with  antibiotics x2 before the tumor was eventually biopsied and shown to be chronic lymphoid leukemia.   Her subsequent history is as detailed above.   PAST MEDICAL HISTORY: Past Medical History:  Diagnosis Date  . Allergy   . Arthritis   . Asthma   . Cancer [facility])    chronic lymphacytic leukemia  . CLL (chronic lymphocytic leukemia) ([place])   . Diabetes mellitus    PMH  . Headache    migraine  . Hyperlipidemia   . Hypertension   . Hypothyroidism   . Lymphoma ([place])   . Thyroid disease    hypothyroidism  . Wears glasses     PAST SURGICAL HISTORY: Past Surgical History:  Procedure Laterality Date  . ABDOMINAL HYSTERECTOMY    . AXILLARY LYMPH NODE BIOPSY Right 08/21/2018   Procedure: RIGHT AXILLARY LYMPH NODE BIOPSY ERAS PATHWAY;  Surgeon: [name], MD;  Location: [facility];  Service: General;  Laterality: Right;  LMA  . BREAST EXCISIONAL BIOPSY Right   . COLONOSCOPY    . IR IMAGING GUIDED PORT INSERTION  11/10/2018  . LYMPH NODE DISSECTION    . STRABISMUS SURGERY    . TONSILLECTOMY    . TOTAL KNEE ARTHROPLASTY Right 12/03/2016   Procedure: RIGHT TOTAL KNEE ARTHROPLASTY;  Surgeon: [name], MD;  Location: [facility];  Service: Orthopedics;  Laterality: Right;    FAMILY HISTORY: Family History  Problem Relation Age of Onset  . Liver cancer Mother   . Heart disease Father   . Breast cancer Sister 25  . Colon cancer Neg Hx   . Pancreatic cancer Neg Hx   . Rectal cancer Neg Hx   .  Stomach cancer Neg Hx    The patient's father died at the age of 110 from a myocardial infarction. The patient's mother died at the age of 11 from primary liver carcinoma. The patient had no brothers. Her one sister, [name], has a history of breast cancer   GYNECOLOGIC HISTORY: Menarche at around age 18. The patient is GX P0. 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 She lives by herself. She had two cats, [name] and [place], but they are currently with her sister and Maria [name] thinks they would be more at home there. She attends a [employer], operational.               ADVANCED DIRECTIVES: Completed 02/03/2019. She has named her niece her 46.   HEALTH MAINTENANCE: Social History   Socioeconomic History  . Marital status: Single    Spouse name: Not on file  . Number of children: Not on file  . Years of education: Not on file  . Highest education level: Not on file  Occupational History  . Not on file  Social Needs  . Financial resource strain: Not hard at all  . Food insecurity:    Worry: Never true    Inability: Never true  . Transportation needs:    Medical: No    Non-medical: No  Tobacco Use  . Smoking status: Never Smoker  . Smokeless tobacco: Never Used  Substance and Sexual Activity  . Alcohol use: Yes    Comment: occasional alcohol intake; once or twice monthly  . Drug use: No  . Sexual activity: Not on file  Lifestyle  . Physical activity:    Days per week: 0 days    Minutes per session: 0 min  . Stress: Not at all  Relationships  . Social connections:    Talks on phone: Once a week    Gets together: Once a week    Attends religious service: Never    Active member of club or organization: No    Attends meetings of clubs or organizations: Never    Relationship status: Never married  . Intimate partner violence:    Fear of current or ex partner: No    Emotionally abused: No    Physically abused: No    Forced sexual activity: No  Other Topics Concern  . Not on file  Social History Narrative  . Not on file                Colonoscopy: 06/01/2014/[facility]             PAP:             Bone density:             Lipid panel:  228/114/50/155/ratio 4.6  On 10/01/2016  ALLERGIES: Allergies  Allergen Reactions  . Iohexol Swelling and Rash  . Compazine  [Prochlorperazine Edisylate] Other (See Comments)    Mild confusion  . Contrast Media  [Iodinated Diagnostic Agents]   . Lactose Intolerance (Gi)     Diarrhea, gas bloating  . Cephalexin Rash    CURRENT MEDICATIONS: Current Outpatient Medications  Medication Sig Dispense Refill  . acetaminophen (TYLENOL 8 HOUR ARTHRITIS PAIN) 650 MG CR tablet Take 650 mg by mouth every 8 (eight) hours as needed for pain.    Marland Kitchen acyclovir (ZOVIRAX) 200  MG capsule Take 1 capsule (200 mg total) by mouth 3 (three) times daily. 90 capsule 4  . allopurinol (ZYLOPRIM) 300 MG tablet Take 1 tablet (300 mg total) by mouth daily. 90 tablet 4  . ciprofloxacin (CIPRO) 500 MG tablet Take 1 tablet (500 mg total) by mouth 2 (two) times daily. 40 tablet 0  . doxycycline (VIBRA-TABS) 100 MG tablet Take 1 tablet (100 mg total) by mouth daily. 60 tablet 2  . furosemide (LASIX) 20 MG tablet TAKE 1 TABLET BY MOUTH EVERY DAY 90 tablet 1  . hydrochlorothiazide (HYDRODIURIL) 12.5 MG tablet Take 12.5 mg by mouth daily.    . hydrocortisone (ANUSOL-HC) 2.5 % rectal cream Place rectally 3 (three) times daily. 30 g 0  . KLOR-CON M20 20 MEQ tablet TAKE 1 TABLET BY MOUTH EVERY DAY 30 tablet 0  . lidocaine-prilocaine (EMLA) cream Apply to affected area once 30 g 3  . predniSONE (DELTASONE) 20 MG tablet Take 3 tablets (60 mg total) by mouth daily with breakfast. Take days 1-5 starting on CHOP chemo day 15 tablet 6  . rosuvastatin (CRESTOR) 10 MG tablet Take 10 mg by mouth at bedtime.  11  . SYNTHROID 88 MCG tablet Take 88 mcg by mouth daily before breakfast.      No current facility-administered medications for this visit.     OBJECTIVE: Middle-aged white woman in no acute distress Vitals:   02/13/19 0942  BP: (!) 124/52  [name]: 80  Resp: 18  Temp: 98.3 F (36.8 C)  SpO2: 100%    ECOG FS: 1  Sclerae unicteric, EOMs intact Wearing a mask She has minimal blood still palpable bilateral axillary adenopathy.  I do not palpate  right cervical or supraclavicular adenopathy.  The left preauricular lymph node is a little smaller and considerably softer.  The left supraclavicular/inferior cervical lymph node is also smaller and softer. Lungs no rales or rhonchi Heart regular rate and rhythm Abd soft, nontender, positive bowel sounds MSK no focal spinal tenderness, no upper extremity lymphedema Neuro: nonfocal, well oriented, appropriate affect Breasts: Deferred    LAB RESULTS: Lab Results  Component Value Date   WBC 2.9 (L) 02/13/2019   NEUTROABS 1.7 02/13/2019   HGB 11.2 (L) 02/13/2019   HCT 34.1 (L) 02/13/2019   MCV 97.7 02/13/2019   PLT 68 (L) 02/13/2019   CMP Latest Ref Rng & Units 02/03/2019 12/26/2018 12/17/2018  Glucose 70 - 99 mg/dL 173(H) 128(H) 170(H)  BUN 8 - 23 mg/dL 15 17 21   Creatinine 0.44 - 1.00 mg/dL 1.23(H) 1.26(H) 0.92  Sodium 135 - 145 mmol/L 140 136 133(L)  Potassium 3.5 - 5.1 mmol/L 3.9 3.0(LL) 3.9  Chloride 98 - 111 mmol/L 105 99 98  CO2 22 - 32 mmol/L 25 26 26   Calcium 8.9 - 10.3 mg/dL 9.2 8.5(L) 8.5(L)  Total Protein 6.5 - 8.1 g/dL 6.2(L) 6.8 5.9(L)  Total Bilirubin 0.3 - 1.2 mg/dL 0.6 0.6 1.4(H)  Alkaline Phos 38 - 126 U/L 71 91 84  AST 15 - 41 U/L 14(L) 12(L) 9(L)  ALT 0 - 44 U/L <6 <6 <6    No results for input(s): LABCA2 in the last 72 hours.   STUDIES: No results found.   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             (b) rituximab added to ibrutinib, first dose 08/27/2018             (c) hepatitis B studies 08/27/2018 negative             (f) ibrutinib/rituximab discontinued after 10/02/2018 dose w/o obvious response  (6) bendamustine/rituximab started 10/22/2018, discontinued after 1 cycle with rapid progression  (7) obinutuzumab/Gaziva started 11/11/2018  (a) second dose given 11/19/2018 together with chemotherapy  (b) third dose given 12/26/2018 and subsequent doses with chemotherapy  (8) CH[O]P chemotherapy started 11/19/2018             (a) echocardiogram on 11/07/2018 shows EF of 55-60%  (b) second cycle of CH[O]P postponed because of pandemic, given 12/26/2018, greatly reduced doses  (c) cycle 3 of CH[O]P/obinutuzumab given 02/03/2019 at increased doses  (9) severe immunocompromise: Marked hypogammaglobulinemia  (a) received IVIG 12/02/2018, repeated 12/17/2018 and 02/13/2019   PLAN:  Anzleigh is tolerating the chemotherapy generally well.  There is evidence of response as her measurable palpable adenopathy is decreased and softer.  At the first cycle she received full dose and tolerated it very poorly.  Second cycle the cyclophosphamide was dropped to 400 mg/m, which she tolerated well.  Cycle 3 that was increased to 500 mg/m.  For the next, fourth cycle I am going to increase the chemotherapy to 550 mg on the cyclophosphamide and I will also increase the doxorubicin dose to 35 mg/m.  It will be important to check her nadir counts and we will figure out a safe way to do that  We are supporting her humoral immunity  which is severely immunocompromised and she will receive IVIG today.  The next IVIG treatment will be in 8 weeks  She knows to call for any fever, change in functional status, or other complication.  Otherwise she will return here 02/23/2019 for cycle #4  Stafford Riviera, [name] Dad, MD  02/13/19 10:16 AM Medical Oncology and Hematology [facility] [address] [place], [place] [address] Tel. [phone]    Fax. [phone]  I, Jacqualyn Posey am acting as a Education administrator for Chauncey Cruel, MD.   I, Lurline Del MD, have reviewed the above documentation for accuracy and completeness, and I agree with the above.

## 2019-02-13 ENCOUNTER — Inpatient Hospital Stay (HOSPITAL_BASED_OUTPATIENT_CLINIC_OR_DEPARTMENT_OTHER): Payer: Medicare Other | Admitting: Oncology

## 2019-02-13 ENCOUNTER — Other Ambulatory Visit: Payer: Self-pay

## 2019-02-13 ENCOUNTER — Inpatient Hospital Stay: Payer: Medicare Other

## 2019-02-13 ENCOUNTER — Inpatient Hospital Stay: Payer: Medicare Other | Attending: Oncology

## 2019-02-13 VITALS — BP 115/50 | HR 69 | Temp 98.3°F | Resp 18

## 2019-02-13 VITALS — BP 124/52 | HR 80 | Temp 98.3°F | Resp 18 | Ht 62.0 in | Wt 177.9 lb

## 2019-02-13 DIAGNOSIS — C9110 Chronic lymphocytic leukemia of B-cell type not having achieved remission: Secondary | ICD-10-CM

## 2019-02-13 DIAGNOSIS — D801 Nonfamilial hypogammaglobulinemia: Secondary | ICD-10-CM

## 2019-02-13 DIAGNOSIS — Z5112 Encounter for antineoplastic immunotherapy: Secondary | ICD-10-CM | POA: Insufficient documentation

## 2019-02-13 DIAGNOSIS — Z5111 Encounter for antineoplastic chemotherapy: Secondary | ICD-10-CM | POA: Diagnosis present

## 2019-02-13 LAB — CBC WITH DIFFERENTIAL/PLATELET
Abs Immature Granulocytes: 0.03 10*3/uL (ref 0.00–0.07)
Basophils Absolute: 0.1 10*3/uL (ref 0.0–0.1)
Basophils Relative: 2 %
Eosinophils Absolute: 0.1 10*3/uL (ref 0.0–0.5)
Eosinophils Relative: 2 %
HCT: 34.1 % — ABNORMAL LOW (ref 36.0–46.0)
Hemoglobin: 11.2 g/dL — ABNORMAL LOW (ref 12.0–15.0)
Immature Granulocytes: 1 %
Lymphocytes Relative: 31 %
Lymphs Abs: 0.9 10*3/uL (ref 0.7–4.0)
MCH: 32.1 pg (ref 26.0–34.0)
MCHC: 32.8 g/dL (ref 30.0–36.0)
MCV: 97.7 fL (ref 80.0–100.0)
Monocytes Absolute: 0.1 10*3/uL (ref 0.1–1.0)
Monocytes Relative: 5 %
Neutro Abs: 1.7 10*3/uL (ref 1.7–7.7)
Neutrophils Relative %: 59 %
Platelets: 68 10*3/uL — ABNORMAL LOW (ref 150–400)
RBC: 3.49 MIL/uL — ABNORMAL LOW (ref 3.87–5.11)
RDW: 15.1 % (ref 11.5–15.5)
WBC: 2.9 10*3/uL — ABNORMAL LOW (ref 4.0–10.5)
nRBC: 0 % (ref 0.0–0.2)

## 2019-02-13 LAB — COMPREHENSIVE METABOLIC PANEL
ALT: <6 U/L (ref 0–44)
AST: 12 U/L — ABNORMAL LOW (ref 15–41)
Albumin: 3.7 g/dL (ref 3.5–5.0)
Alkaline Phosphatase: 80 U/L (ref 38–126)
Anion gap: 10 (ref 5–15)
BUN: 16 mg/dL (ref 8–23)
CO2: 24 mmol/L (ref 22–32)
Calcium: 9 mg/dL (ref 8.9–10.3)
Chloride: 105 mmol/L (ref 98–111)
Creatinine, Ser: 0.89 mg/dL (ref 0.44–1.00)
GFR calc Af Amer: >60 mL/min (ref >60)
GFR calc non Af Amer: >60 mL/min (ref >60)
Glucose, Bld: 168 mg/dL — ABNORMAL HIGH (ref 70–99)
Potassium: 3.8 mmol/L (ref 3.5–5.1)
Sodium: 139 mmol/L (ref 135–145)
Total Bilirubin: 0.7 mg/dL (ref 0.3–1.2)
Total Protein: 5.8 g/dL — ABNORMAL LOW (ref 6.5–8.1)

## 2019-02-13 LAB — LACTATE DEHYDROGENASE: LDH: 255 U/L — ABNORMAL HIGH (ref 98–192)

## 2019-02-13 MED ORDER — ACETAMINOPHEN 325 MG PO TABS
650.0000 mg | ORAL_TABLET | Freq: Once | ORAL | Status: AC
  Administered 2019-02-13: 650 mg via ORAL

## 2019-02-13 MED ORDER — HEPARIN SOD (PORK) LOCK FLUSH 100 UNIT/ML IV SOLN
500.0000 [IU] | Freq: Once | INTRAVENOUS | Status: AC
  Administered 2019-02-13: 16:00:00 500 [IU] via INTRAVENOUS
  Filled 2019-02-13: qty 5

## 2019-02-13 MED ORDER — SODIUM CHLORIDE 0.9 % IV SOLN
INTRAVENOUS | Status: DC
  Administered 2019-02-13: 11:00:00 via INTRAVENOUS
  Filled 2019-02-13: qty 250

## 2019-02-13 MED ORDER — SODIUM CHLORIDE 0.9% FLUSH
10.0000 mL | Freq: Once | INTRAVENOUS | Status: AC
  Administered 2019-02-13: 10 mL via INTRAVENOUS
  Filled 2019-02-13: qty 10

## 2019-02-13 MED ORDER — ACETAMINOPHEN 325 MG PO TABS
ORAL_TABLET | ORAL | Status: AC
  Filled 2019-02-13: qty 2

## 2019-02-13 MED ORDER — DIPHENHYDRAMINE HCL 25 MG PO CAPS
ORAL_CAPSULE | ORAL | Status: AC
  Filled 2019-02-13: qty 1

## 2019-02-13 MED ORDER — IMMUNE GLOBULIN (HUMAN) 20 GM/200ML IV SOLN
1.0000 g/kg | Freq: Once | INTRAVENOUS | Status: AC
  Administered 2019-02-13: 80 g via INTRAVENOUS
  Filled 2019-02-13: qty 800

## 2019-02-13 MED ORDER — DIPHENHYDRAMINE HCL 25 MG PO CAPS
25.0000 mg | ORAL_CAPSULE | Freq: Once | ORAL | Status: AC
  Administered 2019-02-13: 25 mg via ORAL

## 2019-02-13 MED ORDER — SODIUM CHLORIDE 0.9 % IV SOLN
Freq: Once | INTRAVENOUS | Status: AC
  Administered 2019-02-13: 15:00:00 via INTRAVENOUS
  Filled 2019-02-13: qty 250

## 2019-02-13 NOTE — Patient Instructions (Signed)
Immune Globulin Injection What is this medicine? IMMUNE GLOBULIN (im MUNE GLOB yoo lin) helps to prevent or reduce the severity of certain infections in patients who are at risk. This medicine is collected from the pooled blood of many donors. It is used to treat immune system problems, thrombocytopenia, and Kawasaki syndrome. This medicine may be used for other purposes; ask your health care provider or pharmacist if you have questions. COMMON BRAND NAME(S): Baygam, BIVIGAM, Carimune, Carimune NF, cutaquig, Cuvitru, Flebogamma, Flebogamma DIF, GamaSTAN, GamaSTAN S/D, Gamimune N, Gammagard, Gammagard S/D, Gammaked, Gammaplex, Gammar-P IV, Gamunex, Gamunex-C, Hizentra, Iveegam, Iveegam EN, Octagam, Panglobulin, Panglobulin NF, panzyga, Polygam S/D, Privigen, Sandoglobulin, Venoglobulin-S, Vigam, Vivaglobulin, Xembify What should I tell my health care provider before I take this medicine? They need to know if you have any of these conditions: - diabetes - extremely low or no immune antibodies in the blood - heart disease - history of blood clots - hyperprolinemia - infection in the blood, sepsis - kidney disease - taking medicine that may change kidney function - ask your health care provider about your medicine - an unusual or allergic reaction to human immune globulin, albumin, maltose, sucrose, polysorbate 80, other medicines, foods, dyes, or preservatives - pregnant or trying to get pregnant - breast-feeding How should I use this medicine? This medicine is for injection into a muscle or infusion into a vein or skin. It is usually given by a health care professional in a hospital or clinic setting. In rare cases, some brands of this medicine might be given at home. You will be taught how to give this medicine. Use exactly as directed. Take your medicine at regular intervals. Do not take your medicine more often than directed. Talk to your pediatrician regarding the use of this  medicine in children. Special care may be needed. Overdosage: If you think you have taken too much of this medicine contact a poison control center or emergency room at once. NOTE: This medicine is only for you. Do not share this medicine with others. What if I miss a dose? It is important not to miss your dose. Call your doctor or health care professional if you are unable to keep an appointment. If you give yourself the medicine and you miss a dose, take it as soon as you can. If it is almost time for your next dose, take only that dose. Do not take double or extra doses. What may interact with this medicine? -aspirin and aspirin-like medicines -cisplatin -cyclosporine -medicines for infection like acyclovir, adefovir, amphotericin B, bacitracin, cidofovir, foscarnet, ganciclovir, gentamicin, pentamidine, vancomycin -NSAIDS, medicines for pain and inflammation, like ibuprofen or naproxen -pamidronate -vaccines -zoledronic acid This list may not describe all possible interactions. Give your health care provider a list of all the medicines, herbs, non-prescription drugs, or dietary supplements you use. Also tell them if you smoke, drink alcohol, or use illegal drugs. Some items may interact with your medicine. What should I watch for while using this medicine? Your condition will be monitored carefully while you are receiving this medicine. This medicine is made from pooled blood donations of many different people. It may be possible to pass an infection in this medicine. However, the donors are screened for infections and all products are tested for HIV and hepatitis. The medicine is treated to kill most or all bacteria and viruses. Talk to your doctor about the risks and benefits of this medicine. Do not have vaccinations for at least 14 days before, or until at  least 3 months after receiving this medicine. What side effects may I notice from receiving this medicine? Side effects that you  should report to your doctor or health care professional as soon as possible: -allergic reactions like skin rash, itching or hives, swelling of the face, lips, or tongue -breathing problems -chest pain or tightness -fever, chills -headache with nausea, vomiting -neck pain or difficulty moving neck -pain when moving eyes -pain, swelling, warmth in the leg -problems with balance, talking, walking -sudden weight gain -swelling of the ankles, feet, hands -trouble passing urine or change in the amount of urine Side effects that usually do not require medical attention (report to your doctor or health care professional if they continue or are bothersome): -dizzy, drowsy -flushing -increased sweating -leg cramps -muscle aches and pains -pain at site where injected This list may not describe all possible side effects. Call your doctor for medical advice about side effects. You may report side effects to FDA at 1-800-FDA-1088. Where should I keep my medicine? Keep out of the reach of children. This drug is usually given in a hospital or clinic and will not be stored at home. In rare cases, some brands of this medicine may be given at home. If you are using this medicine at home, you will be instructed on how to store this medicine. Throw away any unused medicine after the expiration date on the label. NOTE: This sheet is a summary. It may not cover all possible information. If you have questions about this medicine, talk to your doctor, pharmacist, or health care provider.  2019 Elsevier/Gold Standard (2008-11-17 11:44:49)  Coronavirus (COVID-19) Are you at risk?  Are you at risk for the Coronavirus (COVID-19)?  To be considered HIGH RISK for Coronavirus (COVID-19), you have to meet the following criteria:  . Traveled to [place], [place], [place], [place] or [place]; or in the [place] to [place], [place], [place], or [place]; and have fever, cough, and shortness of breath within  the last 2 weeks of travel OR . Been in close contact with a person diagnosed with COVID-19 within the last 2 weeks and have fever, cough, and shortness of breath . IF YOU DO NOT MEET THESE CRITERIA, YOU ARE CONSIDERED LOW RISK FOR COVID-19.  What to do if you are HIGH RISK for COVID-19?  Marland Kitchen If you are having a medical emergency, call 911. . Seek medical care right away. Before you go to a doctor's office, urgent care or emergency department, call ahead and tell them about your recent travel, contact with someone diagnosed with COVID-19, and your symptoms. You should receive instructions from your physician's office regarding next steps of care.  . When you arrive at healthcare provider, tell the healthcare staff immediately you have returned from visiting [place], [place], [place], [place] or [place]; or traveled in the [place] to [place], [place], [place], or [place]; in the last two weeks or you have been in close contact with a person diagnosed with COVID-19 in the last 2 weeks.   . Tell the health care staff about your symptoms: fever, cough and shortness of breath. . After you have been seen by a medical provider, you will be either: o Tested for (COVID-19) and discharged home on quarantine except to seek medical care if symptoms worsen, and asked to  - Stay home and avoid contact with others until you get your results (4-5 days)  - Avoid travel on public transportation if possible (such  as bus, train, or airplane) or o Sent to the Emergency Department by EMS for evaluation, COVID-19 testing, and possible admission depending on your condition and test results.  What to do if you are LOW RISK for COVID-19?  Reduce your risk of any infection by using the same precautions used for avoiding the common cold or flu:  Marland Kitchen Wash your hands often with soap and warm water for at least 20 seconds.  If soap and water are not readily available, use an alcohol-based hand sanitizer with at least  60% alcohol.  . If coughing or sneezing, cover your mouth and nose by coughing or sneezing into the elbow areas of your shirt or coat, into a tissue or into your sleeve (not your hands). . Avoid shaking hands with others and consider head nods or verbal greetings only. . Avoid touching your eyes, nose, or mouth with unwashed hands.  . Avoid close contact with people who are sick. . Avoid places or events with large numbers of people in one location, like concerts or sporting events. . Carefully consider travel plans you have or are making. . If you are planning any travel outside or inside the [place], visit the CDC's Travelers' Health webpage for the latest health notices. . If you have some symptoms but not all symptoms, continue to monitor at home and seek medical attention if your symptoms worsen. . If you are having a medical emergency, call 911.   [place] / e-Visit: [email or web address]         [facility]: [facility]: [phone]                   [facility]: [phone]

## 2019-02-16 ENCOUNTER — Telehealth: Payer: Self-pay | Admitting: Oncology

## 2019-02-16 NOTE — Telephone Encounter (Signed)
Talk with patient regarding schedule °

## 2019-02-18 LAB — IGG, IGA, IGM
IgA: 21 mg/dL — ABNORMAL LOW (ref 87–352)
IgG (Immunoglobin G), Serum: 488 mg/dL — ABNORMAL LOW (ref 586–1602)
IgM (Immunoglobulin M), Srm: <5 mg/dL — ABNORMAL LOW (ref 26–217)

## 2019-02-24 ENCOUNTER — Other Ambulatory Visit: Payer: Self-pay

## 2019-02-24 ENCOUNTER — Encounter: Payer: Self-pay | Admitting: Adult Health

## 2019-02-24 ENCOUNTER — Inpatient Hospital Stay: Payer: Medicare Other

## 2019-02-24 ENCOUNTER — Inpatient Hospital Stay (HOSPITAL_BASED_OUTPATIENT_CLINIC_OR_DEPARTMENT_OTHER): Payer: Medicare Other | Admitting: Adult Health

## 2019-02-24 VITALS — BP 131/53 | HR 75 | Temp 97.9°F | Resp 18 | Ht 62.0 in | Wt 177.3 lb

## 2019-02-24 DIAGNOSIS — Z5112 Encounter for antineoplastic immunotherapy: Secondary | ICD-10-CM | POA: Diagnosis not present

## 2019-02-24 DIAGNOSIS — R5383 Other fatigue: Secondary | ICD-10-CM

## 2019-02-24 DIAGNOSIS — C9110 Chronic lymphocytic leukemia of B-cell type not having achieved remission: Secondary | ICD-10-CM

## 2019-02-24 DIAGNOSIS — D801 Nonfamilial hypogammaglobulinemia: Secondary | ICD-10-CM

## 2019-02-24 LAB — CBC WITH DIFFERENTIAL/PLATELET
Abs Immature Granulocytes: 0 10*3/uL (ref 0.00–0.07)
Band Neutrophils: 1 %
Basophils Absolute: 0.1 10*3/uL (ref 0.0–0.1)
Basophils Relative: 1 %
Eosinophils Absolute: 0.1 10*3/uL (ref 0.0–0.5)
Eosinophils Relative: 1 %
HCT: 38.8 % (ref 36.0–46.0)
Hemoglobin: 12.4 g/dL (ref 12.0–15.0)
Lymphocytes Relative: 44 %
Lymphs Abs: 2.6 10*3/uL (ref 0.7–4.0)
MCH: 31.7 pg (ref 26.0–34.0)
MCHC: 32 g/dL (ref 30.0–36.0)
MCV: 99.2 fL (ref 80.0–100.0)
Monocytes Absolute: 0.2 10*3/uL (ref 0.1–1.0)
Monocytes Relative: 3 %
Neutro Abs: 3 10*3/uL (ref 1.7–17.7)
Neutrophils Relative %: 50 %
Platelets: 120 10*3/uL — ABNORMAL LOW (ref 150–400)
RBC: 3.91 MIL/uL (ref 3.87–5.11)
RDW: 14.3 % (ref 11.5–15.5)
WBC: 5.9 10*3/uL (ref 4.0–10.5)
nRBC: 0 % (ref 0.0–0.2)

## 2019-02-24 LAB — COMPREHENSIVE METABOLIC PANEL
ALT: <6 U/L (ref 0–44)
AST: 18 U/L (ref 15–41)
Albumin: 4 g/dL (ref 3.5–5.0)
Alkaline Phosphatase: 80 U/L (ref 38–126)
Anion gap: 12 (ref 5–15)
BUN: 22 mg/dL (ref 8–23)
CO2: 22 mmol/L (ref 22–32)
Calcium: 9.3 mg/dL (ref 8.9–10.3)
Chloride: 105 mmol/L (ref 98–111)
Creatinine, Ser: 1.11 mg/dL — ABNORMAL HIGH (ref 0.44–1.00)
GFR calc Af Amer: 59 mL/min — ABNORMAL LOW (ref >60)
GFR calc non Af Amer: 51 mL/min — ABNORMAL LOW (ref >60)
Glucose, Bld: 93 mg/dL (ref 70–99)
Potassium: 4.2 mmol/L (ref 3.5–5.1)
Sodium: 139 mmol/L (ref 135–145)
Total Bilirubin: 0.4 mg/dL (ref 0.3–1.2)
Total Protein: 7.1 g/dL (ref 6.5–8.1)

## 2019-02-24 LAB — LACTATE DEHYDROGENASE: LDH: 362 U/L — ABNORMAL HIGH (ref 98–192)

## 2019-02-24 MED ORDER — DOXORUBICIN HCL CHEMO IV INJECTION 2 MG/ML
35.0000 mg/m2 | Freq: Once | INTRAVENOUS | Status: AC
  Administered 2019-02-24: 70 mg via INTRAVENOUS
  Filled 2019-02-24: qty 35

## 2019-02-24 MED ORDER — PEGFILGRASTIM 6 MG/0.6ML ~~LOC~~ PSKT
PREFILLED_SYRINGE | SUBCUTANEOUS | Status: AC
  Filled 2019-02-24: qty 0.6

## 2019-02-24 MED ORDER — SODIUM CHLORIDE 0.9% FLUSH
10.0000 mL | INTRAVENOUS | Status: DC | PRN
  Administered 2019-02-24: 10 mL
  Filled 2019-02-24: qty 10

## 2019-02-24 MED ORDER — PEGFILGRASTIM 6 MG/0.6ML ~~LOC~~ PSKT
6.0000 mg | PREFILLED_SYRINGE | Freq: Once | SUBCUTANEOUS | Status: AC
  Administered 2019-02-24: 6 mg via SUBCUTANEOUS

## 2019-02-24 MED ORDER — SODIUM CHLORIDE 0.9 % IV SOLN
1000.0000 mg | Freq: Once | INTRAVENOUS | Status: DC
  Filled 2019-02-24: qty 40

## 2019-02-24 MED ORDER — SODIUM CHLORIDE 0.9 % IV SOLN
550.0000 mg/m2 | Freq: Once | INTRAVENOUS | Status: AC
  Administered 2019-02-24: 1100 mg via INTRAVENOUS
  Filled 2019-02-24: qty 55

## 2019-02-24 MED ORDER — HEPARIN SOD (PORK) LOCK FLUSH 100 UNIT/ML IV SOLN
500.0000 [IU] | Freq: Once | INTRAVENOUS | Status: AC | PRN
  Administered 2019-02-24: 500 [IU]
  Filled 2019-02-24: qty 5

## 2019-02-24 MED ORDER — PALONOSETRON HCL INJECTION 0.25 MG/5ML
0.2500 mg | Freq: Once | INTRAVENOUS | Status: AC
  Administered 2019-02-24: 0.25 mg via INTRAVENOUS

## 2019-02-24 MED ORDER — SODIUM CHLORIDE 0.9 % IV SOLN
Freq: Once | INTRAVENOUS | Status: AC
  Administered 2019-02-24: 15:00:00 via INTRAVENOUS
  Filled 2019-02-24: qty 5

## 2019-02-24 MED ORDER — DIPHENHYDRAMINE HCL 50 MG/ML IJ SOLN
25.0000 mg | Freq: Once | INTRAMUSCULAR | Status: AC
  Administered 2019-02-24: 25 mg via INTRAVENOUS

## 2019-02-24 MED ORDER — ACETAMINOPHEN 325 MG PO TABS
ORAL_TABLET | ORAL | Status: AC
  Filled 2019-02-24: qty 2

## 2019-02-24 MED ORDER — ACETAMINOPHEN 325 MG PO TABS
650.0000 mg | ORAL_TABLET | Freq: Once | ORAL | Status: AC
  Administered 2019-02-24: 650 mg via ORAL

## 2019-02-24 MED ORDER — DIPHENHYDRAMINE HCL 50 MG/ML IJ SOLN
INTRAMUSCULAR | Status: AC
  Filled 2019-02-24: qty 1

## 2019-02-24 MED ORDER — SODIUM CHLORIDE 0.9 % IV SOLN
Freq: Once | INTRAVENOUS | Status: AC
  Administered 2019-02-24: 15:00:00 via INTRAVENOUS
  Filled 2019-02-24: qty 250

## 2019-02-24 MED ORDER — PALONOSETRON HCL INJECTION 0.25 MG/5ML
INTRAVENOUS | Status: AC
  Filled 2019-02-24: qty 5

## 2019-02-24 NOTE — Progress Notes (Signed)
Blood return noted from [facility] a cath before, during, and after Doxorubicin IV push. Pt. tolerated well.  Pt. Not going to receive Gazyva as it is a titration dose and she would be here to late. Pt. agreed to come back tomorrow, 02/25/19 at 7:30 AM for treatment. Port A Cath remains accessed and dressed with Biopatch and Sorbaview dressing.

## 2019-02-24 NOTE — Progress Notes (Signed)
[place]  [phone] Fax:(336) [phone]    ID: Maria [name]   DOB: May 01, 1951  MR#: [id number]  GBT#:[id number]  Patient Care Team: Maria Redwood, MD as PCP - General (Internal Medicine) Maria Arabian, MD as Consulting Physician (Orthopedic Surgery) Maria [name], Maria Dad, MD as Consulting Physician (Oncology) OTHER MD:   CHIEF COMPLAINT: Small lymphocytic lymphoma  CURRENT TREATMENT: CHOP/obinutuzumab, IVIG   INTERVAL HISTORY: Maria [name] returns today for follow-up and treatment of her chronic lymphoid leukemia/small cell lymphocytic lymphoma.   She continues on CHOP/obinutuzumab. She is due for this today.  She also receives IVIG for her severe humoral immunosuppression. She receives this every 8 weeks and is due for another dose   Since her last visit here, she has not undergone any additional studies.     REVIEW OF SYSTEMS: Maria [name] is doing well today.  She is continuing on her current therapy without difficulty.  She notes that her lymph nodes continue to shrink, though the one in her left neck is remaining stubborn and hasn't decreased as much as the others.  Maria [name] has some fatigue.  She notes she is feeling much better than prior--particularly since receiving IVIG and denies any new issues.  Maria [name] has had no fever, chills, cough, chest pain or palpitations.  She denies nausea, vomiting, bowel/bladder changes.  A detailed ROS was otherwise non contributory.   HISTORY OF PRESENT ILLNESS: From the original intake note:  Maria [name] developed what she thought was "the flu" in December of 2002. She noted a large lymph node developing in her right anterior cervical area. 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   Her subsequent history is as detailed above.   PAST MEDICAL HISTORY: Past Medical History:  Diagnosis Date  . Allergy   . Arthritis   .  Asthma   . Cancer [facility])    chronic lymphacytic leukemia  . CLL (chronic lymphocytic leukemia) ([place])   . Diabetes mellitus    PMH  . Headache    migraine  . Hyperlipidemia   . Hypertension   . Hypothyroidism   . Lymphoma ([place])   . Thyroid disease    hypothyroidism  . Wears glasses     PAST SURGICAL HISTORY: Past Surgical History:  Procedure Laterality Date  . ABDOMINAL HYSTERECTOMY    . AXILLARY LYMPH NODE BIOPSY Right 08/21/2018   Procedure: RIGHT AXILLARY LYMPH NODE BIOPSY ERAS PATHWAY;  Surgeon: [name], MD;  Location: [place];  Service: General;  Laterality: Right;  LMA  . BREAST EXCISIONAL BIOPSY Right   . COLONOSCOPY    . IR IMAGING GUIDED PORT INSERTION  11/10/2018  . LYMPH NODE DISSECTION    . STRABISMUS SURGERY    . TONSILLECTOMY    . TOTAL KNEE ARTHROPLASTY Right 12/03/2016   Procedure: RIGHT TOTAL KNEE ARTHROPLASTY;  Surgeon: Maria Arabian, MD;  Location: [facility];  Service: Orthopedics;  Laterality: Right;    FAMILY HISTORY: Family History  Problem Relation Age of Onset  . Liver cancer Mother   . Heart disease Father   . Breast cancer Sister 71  . Colon cancer Neg Hx   . Pancreatic cancer Neg Hx   . Rectal cancer Neg Hx   . Stomach cancer Neg Hx    The patient's father died at the age of 69 from a myocardial infarction. The patient's mother died at the age of 57 from primary liver carcinoma. The patient had no  brothers. Her one sister, Maria [name], has a history of breast cancer   GYNECOLOGIC HISTORY: Menarche at around age 77. The patient is GX P0. 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 She lives by herself. She had two cats, Maria [name] and Maria [name], but they are currently with her sister and Maria [name] thinks they would be more at home there. She attends a [employer], operational.                ADVANCED DIRECTIVES: Completed 02/03/2019. She has named her niece her 13.   HEALTH MAINTENANCE: Social History   Socioeconomic History  . Marital status: Single    Spouse name: Not on file  . Number of children: Not on file  . Years of education: Not on file  . Highest education level: Not on file  Occupational History  . Not on file  Social Needs  . Financial resource strain: Not hard at all  . Food insecurity    Worry: Never true    Inability: Never true  . Transportation needs    Medical: No    Non-medical: No  Tobacco Use  . Smoking status: Never Smoker  . Smokeless tobacco: Never Used  Substance and Sexual Activity  . Alcohol use: Yes    Comment: occasional alcohol intake; once or twice monthly  . Drug use: No  . Sexual activity: Not on file  Lifestyle  . Physical activity    Days per week: 0 days    Minutes per session: 0 min  . Stress: Not at all  Relationships  . Social Herbalist on phone: Once a week    Gets together: Once a week    Attends religious service: Never    Active member of club or organization: No    Attends meetings of clubs or organizations: Never    Relationship status: Never married  . Intimate partner violence    Fear of current or ex partner: No    Emotionally abused: No    Physically abused: No    Forced sexual activity: No  Other Topics Concern  . Not on file  Social History Narrative  . Not on file                Colonoscopy: 06/01/2014/[facility]             PAP:             Bone density:             Lipid panel:  228/114/50/155/ratio 4.6  On 10/01/2016  ALLERGIES: Allergies  Allergen Reactions  . Iohexol Swelling and Rash  . Compazine [Prochlorperazine Edisylate] Other (See Comments)    Mild confusion  . Contrast Media  [Iodinated Diagnostic Agents]   . Lactose Intolerance (Gi)     Diarrhea, gas bloating  . Cephalexin Rash    CURRENT MEDICATIONS: Current Outpatient Medications   Medication Sig Dispense Refill  . acetaminophen (TYLENOL 8 HOUR ARTHRITIS PAIN) 650 MG CR tablet Take 650 mg by mouth every 8 (eight) hours as needed for pain.    Marland Kitchen acyclovir (ZOVIRAX) 200 MG capsule Take 1 capsule (200 mg total) by mouth 3 (three) times daily. 90 capsule 4  . allopurinol (ZYLOPRIM) 300 MG tablet Take 1 tablet (300 mg total) by mouth daily. 90 tablet 4  .  ciprofloxacin (CIPRO) 500 MG tablet Take 1 tablet (500 mg total) by mouth 2 (two) times daily. 40 tablet 0  . doxycycline (VIBRA-TABS) 100 MG tablet Take 1 tablet (100 mg total) by mouth daily. 60 tablet 2  . furosemide (LASIX) 20 MG tablet TAKE 1 TABLET BY MOUTH EVERY DAY 90 tablet 1  . hydrochlorothiazide (HYDRODIURIL) 12.5 MG tablet Take 12.5 mg by mouth daily.    . hydrocortisone (ANUSOL-HC) 2.5 % rectal cream Place rectally 3 (three) times daily. 30 g 0  . KLOR-CON M20 20 MEQ tablet TAKE 1 TABLET BY MOUTH EVERY DAY 30 tablet 0  . lidocaine-prilocaine (EMLA) cream Apply to affected area once 30 g 3  . predniSONE (DELTASONE) 20 MG tablet Take 3 tablets (60 mg total) by mouth daily with breakfast. Take days 1-5 starting on CHOP chemo day 15 tablet 6  . rosuvastatin (CRESTOR) 10 MG tablet Take 10 mg by mouth at bedtime.  11  . SYNTHROID 88 MCG tablet Take 88 mcg by mouth daily before breakfast.      No current facility-administered medications for this visit.     OBJECTIVE: Middle-aged white woman in no acute distress Vitals:   02/24/19 1337  BP: (!) 131/53  [name]: 75  Resp: 18  Temp: 97.9 F (36.6 C)  SpO2: 98%    ECOG FS: 1 GENERAL: Patient is a well appearing female in no acute distress HEENT:  Sclerae anicteric.  Oropharynx clear and moist. No ulcerations or evidence of oropharyngeal candidiasis. Neck is supple.  NODES:  Continued cervical, supraclavicular, and axillary adenopathy, smaller than prior BREAST EXAM:  Deferred. LUNGS:  Clear to auscultation bilaterally.  No wheezes or rhonchi. HEART:  Regular  rate and rhythm. No murmur appreciated. ABDOMEN:  Soft, nontender.  Positive, normoactive bowel sounds. No organomegaly palpated. MSK:  No focal spinal tenderness to palpation. Full range of motion bilaterally in the upper extremities. EXTREMITIES:  No peripheral edema.   SKIN:  Clear with no obvious rashes or skin changes. No nail dyscrasia. NEURO:  Nonfocal. Well oriented.  Appropriate affect.     LAB RESULTS: Lab Results  Component Value Date   WBC 5.9 02/24/2019   NEUTROABS PENDING 02/24/2019   HGB 12.4 02/24/2019   HCT 38.8 02/24/2019   MCV 99.2 02/24/2019   PLT 120 (L) 02/24/2019   CMP Latest Ref Rng & Units 02/13/2019 02/03/2019 12/26/2018  Glucose 70 - 99 mg/dL 168(H) 173(H) 128(H)  BUN 8 - 23 mg/dL 16 15 17   Creatinine 0.44 - 1.00 mg/dL 0.89 1.23(H) 1.26(H)  Sodium 135 - 145 mmol/L 139 140 136  Potassium 3.5 - 5.1 mmol/L 3.8 3.9 3.0(LL)  Chloride 98 - 111 mmol/L 105 105 99  CO2 22 - 32 mmol/L 24 25 26   Calcium 8.9 - 10.3 mg/dL 9.0 9.2 8.5(L)  Total Protein 6.5 - 8.1 g/dL 5.8(L) 6.2(L) 6.8  Total Bilirubin 0.3 - 1.2 mg/dL 0.7 0.6 0.6  Alkaline Phos 38 - 126 U/L 80 71 91  AST 15 - 41 U/L 12(L) 14(L) 12(L)  ALT 0 - 44 U/L <6 <6 <6    No results for input(s): LABCA2 in the last 72 hours.   STUDIES: No results found.   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             (b) rituximab added to ibrutinib, first dose 08/27/2018             (c) hepatitis B studies 08/27/2018 negative             (f) ibrutinib/rituximab discontinued after 10/02/2018 dose w/o obvious response  (6) bendamustine/rituximab started 10/22/2018, discontinued after 1 cycle with rapid progression  (7) obinutuzumab/Gaziva started 11/11/2018  (a) second dose given 11/19/2018 together with chemotherapy  (b) third dose given 12/26/2018 and subsequent doses with chemotherapy  (8) CH[O]P chemotherapy started 11/19/2018             (a) echocardiogram on 11/07/2018 shows EF of 55-60%  (b) second cycle of CH[O]P postponed because of pandemic, given 12/26/2018, greatly reduced doses  (c) cycle 3 of CH[O]P/obinutuzumab given 02/03/2019 at increased doses  (d) cycle 4 of CH[O]P given on 6/16 at increased doses  (9) severe immunocompromise: Marked hypogammaglobulinemia  (a) received IVIG 12/02/2018, repeated 12/17/2018 and 02/13/2019 (given every 8 weeks at this [name])   PLAN:  Cathleen is doing well today.  She will proceed with her next cycle of Doxorubicin/cyclophosphamide/prednisone/obinutuzumab today.  She is receiving this every three weeks.  Today's doses are slightly increased as per Dr. [name] [name] plan from his previous visit with her.  She is tolerating the treatment well.  I recommended that she continue with her current pandemic precautions.    We will see her back in 3 weeks for labs, f/u and her next treatment.  She knows to call for any questions or concerns prior to her next appointment with [place].  A total of (20) minutes of face-to-face time was spent with this  patient with greater than 50% of that time in counseling and care-coordination.   Wilber Bihari, NP  02/24/19 1:52 PM Medical Oncology and Hematology [facility] [address] [place], [place] [address] Tel. [phone]    Fax. [phone]

## 2019-02-24 NOTE — Patient Instructions (Signed)
[facility] Discharge Instructions for Patients Receiving Chemotherapy  Today you received the following chemotherapy agents Doxorubicin, Cytoxan  To help prevent nausea and vomiting after your treatment, we encourage you to take your nausea medication as directed by your MD.   If you develop nausea and vomiting that is not controlled by your nausea medication, call the clinic.   BELOW ARE SYMPTOMS THAT SHOULD BE REPORTED IMMEDIATELY:  *FEVER GREATER THAN 100.5 F  *CHILLS WITH OR WITHOUT FEVER  NAUSEA AND VOMITING THAT IS NOT CONTROLLED WITH YOUR NAUSEA MEDICATION  *UNUSUAL SHORTNESS OF BREATH  *UNUSUAL BRUISING OR BLEEDING  TENDERNESS IN MOUTH AND THROAT WITH OR WITHOUT PRESENCE OF ULCERS  *URINARY PROBLEMS  *BOWEL PROBLEMS  UNUSUAL RASH Items with * indicate a potential emergency and should be followed up as soon as possible.  Feel free to call the clinic should you have any questions or concerns. The clinic phone number is [phone].  Please show the [facility] at check-in to the Emergency Department and triage nurse.  Coronavirus (COVID-19) Are you at risk?  Are you at risk for the Coronavirus (COVID-19)?  To be considered HIGH RISK for Coronavirus (COVID-19), you have to meet the following criteria:  . Traveled to [place], [place], [place], [place] or [place]; or in the [place] to [place], [place], [place], or [place]; and have fever, cough, and shortness of breath within the last 2 weeks of travel OR . Been in close contact with a person diagnosed with COVID-19 within the last 2 weeks and have fever, cough, and shortness of breath . IF YOU DO NOT MEET THESE CRITERIA, YOU ARE CONSIDERED LOW RISK FOR COVID-19.  What to do if you are HIGH RISK for COVID-19?  Marland Kitchen If you are having a medical emergency, call 911. . Seek medical care right away. Before you go to a doctor's office, urgent care or emergency department, call ahead and tell  them about your recent travel, contact with someone diagnosed with COVID-19, and your symptoms. You should receive instructions from your physician's office regarding next steps of care.  . When you arrive at healthcare provider, tell the healthcare staff immediately you have returned from visiting [place], [place], [place], [place] or [place]; or traveled in the [place] to [place], [place], [place], or [place]; in the last two weeks or you have been in close contact with a person diagnosed with COVID-19 in the last 2 weeks.   . Tell the health care staff about your symptoms: fever, cough and shortness of breath. . After you have been seen by a medical provider, you will be either: o Tested for (COVID-19) and discharged home on quarantine except to seek medical care if symptoms worsen, and asked to  - Stay home and avoid contact with others until you get your results (4-5 days)  - Avoid travel on public transportation if possible (such as bus, train, or airplane) or o Sent to the Emergency Department by EMS for evaluation, COVID-19 testing, and possible admission depending on your condition and test results.  What to do if you are LOW RISK for COVID-19?  Reduce your risk of any infection by using the same precautions used for avoiding the common cold or flu:  Marland Kitchen Wash your hands often with soap and warm water for at least 20 seconds.  If soap and water are not readily available, use an alcohol-based hand sanitizer with at least 60% alcohol.  Marland Kitchen  If coughing or sneezing, cover your mouth and nose by coughing or sneezing into the elbow areas of your shirt or coat, into a tissue or into your sleeve (not your hands). . Avoid shaking hands with others and consider head nods or verbal greetings only. . Avoid touching your eyes, nose, or mouth with unwashed hands.  . Avoid close contact with people who are sick. . Avoid places or events with large numbers of people in one location, like concerts or  sporting events. . Carefully consider travel plans you have or are making. . If you are planning any travel outside or inside the [place], visit the CDC's Travelers' Health webpage for the latest health notices. . If you have some symptoms but not all symptoms, continue to monitor at home and seek medical attention if your symptoms worsen. . If you are having a medical emergency, call 911.   [place] / e-Visit: [email or web address]         [facility]: [facility]: [phone]                   [facility]: [phone]

## 2019-02-25 ENCOUNTER — Other Ambulatory Visit: Payer: Self-pay

## 2019-02-25 ENCOUNTER — Inpatient Hospital Stay: Payer: Medicare Other

## 2019-02-25 VITALS — BP 130/65 | HR 64 | Temp 98.0°F | Resp 18

## 2019-02-25 DIAGNOSIS — C9110 Chronic lymphocytic leukemia of B-cell type not having achieved remission: Secondary | ICD-10-CM

## 2019-02-25 DIAGNOSIS — Z5112 Encounter for antineoplastic immunotherapy: Secondary | ICD-10-CM | POA: Diagnosis not present

## 2019-02-25 LAB — IGG, IGA, IGM
IgA: 24 mg/dL — ABNORMAL LOW (ref 87–352)
IgG (Immunoglobin G), Serum: 1168 mg/dL (ref 586–1602)
IgM (Immunoglobulin M), Srm: <5 mg/dL — ABNORMAL LOW (ref 26–217)

## 2019-02-25 MED ORDER — DIPHENHYDRAMINE HCL 50 MG/ML IJ SOLN
25.0000 mg | Freq: Once | INTRAMUSCULAR | Status: AC
  Administered 2019-02-25: 25 mg via INTRAVENOUS

## 2019-02-25 MED ORDER — DIPHENHYDRAMINE HCL 50 MG/ML IJ SOLN
INTRAMUSCULAR | Status: AC
  Filled 2019-02-25: qty 1

## 2019-02-25 MED ORDER — SODIUM CHLORIDE 0.9 % IV SOLN
1000.0000 mg | Freq: Once | INTRAVENOUS | Status: AC
  Administered 2019-02-25: 1000 mg via INTRAVENOUS
  Filled 2019-02-25: qty 40

## 2019-02-25 MED ORDER — HEPARIN SOD (PORK) LOCK FLUSH 100 UNIT/ML IV SOLN
500.0000 [IU] | Freq: Once | INTRAVENOUS | Status: AC | PRN
  Administered 2019-02-25: 13:00:00 500 [IU]
  Filled 2019-02-25: qty 5

## 2019-02-25 MED ORDER — SODIUM CHLORIDE 0.9 % IV SOLN
INTRAVENOUS | Status: DC
  Administered 2019-02-25: 08:00:00 via INTRAVENOUS
  Filled 2019-02-25: qty 250

## 2019-02-25 MED ORDER — DEXAMETHASONE SODIUM PHOSPHATE 10 MG/ML IJ SOLN
10.0000 mg | Freq: Once | INTRAMUSCULAR | Status: AC
  Administered 2019-02-25: 10 mg via INTRAVENOUS

## 2019-02-25 MED ORDER — ACETAMINOPHEN 325 MG PO TABS
650.0000 mg | ORAL_TABLET | Freq: Once | ORAL | Status: AC
  Administered 2019-02-25: 650 mg via ORAL

## 2019-02-25 MED ORDER — ACETAMINOPHEN 325 MG PO TABS
ORAL_TABLET | ORAL | Status: AC
  Filled 2019-02-25: qty 2

## 2019-02-25 MED ORDER — LEVOTHYROXINE SODIUM 88 MCG PO TABS
88.0000 ug | ORAL_TABLET | Freq: Every day | ORAL | Status: DC
  Administered 2019-02-25: 88 ug via ORAL
  Filled 2019-02-25: qty 1

## 2019-02-25 MED ORDER — DEXAMETHASONE SODIUM PHOSPHATE 10 MG/ML IJ SOLN
INTRAMUSCULAR | Status: AC
  Filled 2019-02-25: qty 1

## 2019-02-25 MED ORDER — SODIUM CHLORIDE 0.9% FLUSH
10.0000 mL | INTRAVENOUS | Status: DC | PRN
  Administered 2019-02-25: 10 mL
  Filled 2019-02-25: qty 10

## 2019-02-25 NOTE — Patient Instructions (Signed)
[facility] Center Discharge Instructions for Patients Receiving Chemotherapy  Today you received the following chemotherapy agents: Gazyva   To help prevent nausea and vomiting after your treatment, we encourage you to take your nausea medication as directed.    If you develop nausea and vomiting that is not controlled by your nausea medication, call the clinic.   BELOW ARE SYMPTOMS THAT SHOULD BE REPORTED IMMEDIATELY:  *FEVER GREATER THAN 100.5 F  *CHILLS WITH OR WITHOUT FEVER  NAUSEA AND VOMITING THAT IS NOT CONTROLLED WITH YOUR NAUSEA MEDICATION  *UNUSUAL SHORTNESS OF BREATH  *UNUSUAL BRUISING OR BLEEDING  TENDERNESS IN MOUTH AND THROAT WITH OR WITHOUT PRESENCE OF ULCERS  *URINARY PROBLEMS  *BOWEL PROBLEMS  UNUSUAL RASH Items with * indicate a potential emergency and should be followed up as soon as possible.  Feel free to call the clinic should you have any questions or concerns. The clinic phone number is [phone].  Please show the CHEMO ALERT CARD at check-in to the Emergency Department and triage nurse.   

## 2019-02-25 NOTE — Progress Notes (Signed)
Prior to treatment this AM, patient stated that she forgot to take her Synthroid this morning, and inquired if we could obtain a one time order for the missed dose. She was concerned about skipping the dose and did not want to wait to take it this afternoon when she got home because "it will keep me awake tonight". MD notified of request and approved order for Synthroid 56mcg PO once. Patient received dose.

## 2019-03-16 ENCOUNTER — Other Ambulatory Visit: Payer: Self-pay | Admitting: Oncology

## 2019-03-17 ENCOUNTER — Inpatient Hospital Stay: Payer: Medicare Other

## 2019-03-17 ENCOUNTER — Encounter: Payer: Self-pay | Admitting: Oncology

## 2019-03-17 ENCOUNTER — Other Ambulatory Visit: Payer: Self-pay

## 2019-03-17 ENCOUNTER — Inpatient Hospital Stay: Payer: Medicare Other | Admitting: Adult Health

## 2019-03-17 ENCOUNTER — Encounter: Payer: Self-pay | Admitting: Adult Health

## 2019-03-17 ENCOUNTER — Inpatient Hospital Stay: Payer: Medicare Other | Attending: Oncology

## 2019-03-17 VITALS — BP 126/52 | HR 86 | Temp 98.9°F | Resp 17 | Ht 62.0 in | Wt 174.2 lb

## 2019-03-17 VITALS — BP 126/56 | HR 70 | Temp 97.8°F | Resp 19

## 2019-03-17 DIAGNOSIS — Z5189 Encounter for other specified aftercare: Secondary | ICD-10-CM | POA: Diagnosis not present

## 2019-03-17 DIAGNOSIS — C9110 Chronic lymphocytic leukemia of B-cell type not having achieved remission: Secondary | ICD-10-CM | POA: Diagnosis not present

## 2019-03-17 DIAGNOSIS — R5383 Other fatigue: Secondary | ICD-10-CM

## 2019-03-17 DIAGNOSIS — D801 Nonfamilial hypogammaglobulinemia: Secondary | ICD-10-CM | POA: Diagnosis not present

## 2019-03-17 DIAGNOSIS — Z79899 Other long term (current) drug therapy: Secondary | ICD-10-CM | POA: Diagnosis not present

## 2019-03-17 DIAGNOSIS — Z5112 Encounter for antineoplastic immunotherapy: Secondary | ICD-10-CM | POA: Diagnosis present

## 2019-03-17 DIAGNOSIS — Z5111 Encounter for antineoplastic chemotherapy: Secondary | ICD-10-CM | POA: Diagnosis present

## 2019-03-17 LAB — COMPREHENSIVE METABOLIC PANEL
ALT: <6 U/L (ref 0–44)
AST: 14 U/L — ABNORMAL LOW (ref 15–41)
Albumin: 3.7 g/dL (ref 3.5–5.0)
Alkaline Phosphatase: 70 U/L (ref 38–126)
Anion gap: 10 (ref 5–15)
BUN: 18 mg/dL (ref 8–23)
CO2: 24 mmol/L (ref 22–32)
Calcium: 8.5 mg/dL — ABNORMAL LOW (ref 8.9–10.3)
Chloride: 107 mmol/L (ref 98–111)
Creatinine, Ser: 0.95 mg/dL (ref 0.44–1.00)
GFR calc Af Amer: >60 mL/min (ref >60)
GFR calc non Af Amer: >60 mL/min (ref >60)
Glucose, Bld: 144 mg/dL — ABNORMAL HIGH (ref 70–99)
Potassium: 3.4 mmol/L — ABNORMAL LOW (ref 3.5–5.1)
Sodium: 141 mmol/L (ref 135–145)
Total Bilirubin: 0.5 mg/dL (ref 0.3–1.2)
Total Protein: 6.2 g/dL — ABNORMAL LOW (ref 6.5–8.1)

## 2019-03-17 LAB — CBC WITH DIFFERENTIAL/PLATELET
Abs Immature Granulocytes: 0 10*3/uL (ref 0.00–0.07)
Basophils Absolute: 0.1 10*3/uL (ref 0.0–0.1)
Basophils Relative: 2 %
Eosinophils Absolute: 0 10*3/uL (ref 0.0–0.5)
Eosinophils Relative: 0 %
HCT: 34.8 % — ABNORMAL LOW (ref 36.0–46.0)
Hemoglobin: 11.6 g/dL — ABNORMAL LOW (ref 12.0–15.0)
Lymphocytes Relative: 29 %
Lymphs Abs: 1.4 10*3/uL (ref 0.7–4.0)
MCH: 31.6 pg (ref 26.0–34.0)
MCHC: 33.3 g/dL (ref 30.0–36.0)
MCV: 94.8 fL (ref 80.0–100.0)
Monocytes Absolute: 0.2 10*3/uL (ref 0.1–1.0)
Monocytes Relative: 4 %
Neutro Abs: 3.2 10*3/uL (ref 1.7–17.7)
Neutrophils Relative %: 65 %
Platelets: 97 10*3/uL — ABNORMAL LOW (ref 150–400)
RBC: 3.67 MIL/uL — ABNORMAL LOW (ref 3.87–5.11)
RDW: 14.2 % (ref 11.5–15.5)
WBC: 4.9 10*3/uL (ref 4.0–10.5)
nRBC: 0 % (ref 0.0–0.2)

## 2019-03-17 LAB — LACTATE DEHYDROGENASE: LDH: 289 U/L — ABNORMAL HIGH (ref 98–192)

## 2019-03-17 MED ORDER — PEGFILGRASTIM 6 MG/0.6ML ~~LOC~~ PSKT
PREFILLED_SYRINGE | SUBCUTANEOUS | Status: AC
  Filled 2019-03-17: qty 0.6

## 2019-03-17 MED ORDER — DIPHENHYDRAMINE HCL 50 MG/ML IJ SOLN
25.0000 mg | Freq: Once | INTRAMUSCULAR | Status: AC
  Administered 2019-03-17: 11:00:00 25 mg via INTRAVENOUS

## 2019-03-17 MED ORDER — DIPHENHYDRAMINE HCL 50 MG/ML IJ SOLN
INTRAMUSCULAR | Status: AC
  Filled 2019-03-17: qty 1

## 2019-03-17 MED ORDER — HEPARIN SOD (PORK) LOCK FLUSH 100 UNIT/ML IV SOLN
500.0000 [IU] | Freq: Once | INTRAVENOUS | Status: AC | PRN
  Administered 2019-03-17: 16:00:00 500 [IU]
  Filled 2019-03-17: qty 5

## 2019-03-17 MED ORDER — ACETAMINOPHEN 325 MG PO TABS
650.0000 mg | ORAL_TABLET | Freq: Once | ORAL | Status: AC
  Administered 2019-03-17: 650 mg via ORAL

## 2019-03-17 MED ORDER — DOXORUBICIN HCL CHEMO IV INJECTION 2 MG/ML
35.0000 mg/m2 | Freq: Once | INTRAVENOUS | Status: AC
  Administered 2019-03-17: 12:00:00 70 mg via INTRAVENOUS
  Filled 2019-03-17: qty 35

## 2019-03-17 MED ORDER — ACETAMINOPHEN 325 MG PO TABS
ORAL_TABLET | ORAL | Status: AC
  Filled 2019-03-17: qty 2

## 2019-03-17 MED ORDER — SODIUM CHLORIDE 0.9 % IV SOLN
Freq: Once | INTRAVENOUS | Status: AC
  Administered 2019-03-17: 10:00:00 via INTRAVENOUS
  Filled 2019-03-17: qty 250

## 2019-03-17 MED ORDER — DEXAMETHASONE SODIUM PHOSPHATE 10 MG/ML IJ SOLN: 10.0000 mg | Freq: Once | INTRAMUSCULAR | Status: DC

## 2019-03-17 MED ORDER — PALONOSETRON HCL INJECTION 0.25 MG/5ML
INTRAVENOUS | Status: AC
  Filled 2019-03-17: qty 5

## 2019-03-17 MED ORDER — SODIUM CHLORIDE 0.9 % IV SOLN
Freq: Once | INTRAVENOUS | Status: AC
  Administered 2019-03-17: 11:00:00 via INTRAVENOUS
  Filled 2019-03-17: qty 5

## 2019-03-17 MED ORDER — SODIUM CHLORIDE 0.9 % IV SOLN
550.0000 mg/m2 | Freq: Once | INTRAVENOUS | Status: AC
  Administered 2019-03-17: 12:00:00 1100 mg via INTRAVENOUS
  Filled 2019-03-17: qty 55

## 2019-03-17 MED ORDER — SODIUM CHLORIDE 0.9% FLUSH
10.0000 mL | INTRAVENOUS | Status: DC | PRN
  Administered 2019-03-17: 10 mL
  Filled 2019-03-17: qty 10

## 2019-03-17 MED ORDER — PALONOSETRON HCL INJECTION 0.25 MG/5ML
0.2500 mg | Freq: Once | INTRAVENOUS | Status: AC
  Administered 2019-03-17: 0.25 mg via INTRAVENOUS

## 2019-03-17 MED ORDER — SODIUM CHLORIDE 0.9 % IV SOLN
1000.0000 mg | Freq: Once | INTRAVENOUS | Status: AC
  Administered 2019-03-17: 13:00:00 1000 mg via INTRAVENOUS
  Filled 2019-03-17: qty 40

## 2019-03-17 MED ORDER — PEGFILGRASTIM 6 MG/0.6ML ~~LOC~~ PSKT
6.0000 mg | PREFILLED_SYRINGE | Freq: Once | SUBCUTANEOUS | Status: AC
  Administered 2019-03-17: 16:00:00 6 mg via SUBCUTANEOUS

## 2019-03-17 NOTE — Patient Instructions (Signed)
**Per Mendel Ryder Causey-NP: Come back in 1 week for lab only**  [facility] Discharge Instructions for Patients Receiving Chemotherapy  Today you received the following chemotherapy agents: Doxorubicin (Adriamycin), Cyclophosphamide (Cytoxan), and Obinutuzumab Dyann Kief)  To help prevent nausea and vomiting after your treatment, we encourage you to take your nausea medication as directed.   If you develop nausea and vomiting that is not controlled by your nausea medication, call the clinic.   BELOW ARE SYMPTOMS THAT SHOULD BE REPORTED IMMEDIATELY:  *FEVER GREATER THAN 100.5 F  *CHILLS WITH OR WITHOUT FEVER  NAUSEA AND VOMITING THAT IS NOT CONTROLLED WITH YOUR NAUSEA MEDICATION  *UNUSUAL SHORTNESS OF BREATH  *UNUSUAL BRUISING OR BLEEDING  TENDERNESS IN MOUTH AND THROAT WITH OR WITHOUT PRESENCE OF ULCERS  *URINARY PROBLEMS  *BOWEL PROBLEMS  UNUSUAL RASH Items with * indicate a potential emergency and should be followed up as soon as possible.  Feel free to call the clinic should you have any questions or concerns. The clinic phone number is [phone].  Please show the [facility] at check-in to the Emergency Department and triage nurse.  Coronavirus (COVID-19) Are you at risk?  Are you at risk for the Coronavirus (COVID-19)?  To be considered HIGH RISK for Coronavirus (COVID-19), you have to meet the following criteria:  . Traveled to [place], [place], [place], [place] or [place]; or in the [place] to [place], [place], [place], or [place]; and have fever, cough, and shortness of breath within the last 2 weeks of travel OR . Been in close contact with a person diagnosed with COVID-19 within the last 2 weeks and have fever, cough, and shortness of breath . IF YOU DO NOT MEET THESE CRITERIA, YOU ARE CONSIDERED LOW RISK FOR COVID-19.  What to do if you are HIGH RISK for COVID-19?  Marland Kitchen If you are having a medical emergency, call 911. . Seek  medical care right away. Before you go to a doctor's office, urgent care or emergency department, call ahead and tell them about your recent travel, contact with someone diagnosed with COVID-19, and your symptoms. You should receive instructions from your physician's office regarding next steps of care.  . When you arrive at healthcare provider, tell the healthcare staff immediately you have returned from visiting [place], [place], [place], [place] or [place]; or traveled in the [place] to [place], [place], [place], or [place]; in the last two weeks or you have been in close contact with a person diagnosed with COVID-19 in the last 2 weeks.   . Tell the health care staff about your symptoms: fever, cough and shortness of breath. . After you have been seen by a medical provider, you will be either: o Tested for (COVID-19) and discharged home on quarantine except to seek medical care if symptoms worsen, and asked to  - Stay home and avoid contact with others until you get your results (4-5 days)  - Avoid travel on public transportation if possible (such as bus, train, or airplane) or o Sent to the Emergency Department by EMS for evaluation, COVID-19 testing, and possible admission depending on your condition and test results.  What to do if you are LOW RISK for COVID-19?  Reduce your risk of any infection by using the same precautions used for avoiding the common cold or flu:  Marland Kitchen Wash your hands often with soap and warm water for at least 20 seconds.  If soap and water are not  readily available, use an alcohol-based hand sanitizer with at least 60% alcohol.  . If coughing or sneezing, cover your mouth and nose by coughing or sneezing into the elbow areas of your shirt or coat, into a tissue or into your sleeve (not your hands). . Avoid shaking hands with others and consider head nods or verbal greetings only. . Avoid touching your eyes, nose, or mouth with unwashed hands.  . Avoid close  contact with people who are sick. . Avoid places or events with large numbers of people in one location, like concerts or sporting events. . Carefully consider travel plans you have or are making. . If you are planning any travel outside or inside the [place], visit the CDC's Travelers' Health webpage for the latest health notices. . If you have some symptoms but not all symptoms, continue to monitor at home and seek medical attention if your symptoms worsen. . If you are having a medical emergency, call 911.   [place] [facility] [place] / e-Visit: [email or web address]         [facility]: [facility]: [phone]                   [facility]: [phone]

## 2019-03-17 NOTE — Progress Notes (Signed)
[place]  [phone]) [phone] [phone]) [phone]    ID: [name]   DOB: 1951-09-03  MR#: [id number]  DGL#:[id number]  Patient Care Team: Marton Redwood, MD as PCP - General (Internal Medicine) [name], MD as Consulting Physician (Orthopedic Surgery) [name], [name] Dad, MD as Consulting Physician (Oncology) OTHER MD:   CHIEF COMPLAINT: Small lymphocytic lymphoma  CURRENT TREATMENT: CHOP/obinutuzumab, IVIG   INTERVAL HISTORY: Maria [name] returns today for follow-up and treatment of her chronic lymphoid leukemia/small cell lymphocytic lymphoma.   She continues on CHOP/obinutuzumab. She receives this every three weeks.    She also receives IVIG for her severe humoral immunosuppression. She receives this every 8 weeks.  This appears to be due again on 04/16/2019.  REVIEW OF SYSTEMS: Maria [name] is doing well today.  At her last visit, she underwent slightly increased doses of the CHOP therapy.  She says she tolerated this well, with the exception of nausea x 1 day.  She wears onpro with the therapy and tolerated this well.  She did not have nadir labs.    Maria [name] notes that her nodes are still swollen, particularly on the left side of her neck.  She feels like they are smaller than prior however.  Maria [name] notes her leg swelling is improving.  She is happy with how things are.  Maria [name] is fatigued.  She does practice energy conservation.    Maria [name] was going to the senior center and they are closed right now.  She also walks to her mailbox.  Maria [name] denies fever or chills.  She is without vomiting, bowel or bladder concerns.  She has no cough, shortness of breath, chest pain, or palpitations.  A detailed ROS was otherwise non contributory.     HISTORY OF PRESENT ILLNESS: From the original intake note:  Maria [name] developed what she thought was "the flu" in December of 2002. She noted a large lymph node developing in her right anterior cervical area.  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   Her subsequent history is as detailed above.   PAST MEDICAL HISTORY: Past Medical History:  Diagnosis Date  . Allergy   . Arthritis   . Asthma   . Cancer [facility])    chronic lymphacytic leukemia  . CLL (chronic lymphocytic leukemia) ([place])   . Diabetes mellitus    PMH  . Headache    migraine  . Hyperlipidemia   . Hypertension   . Hypothyroidism   . Lymphoma ([place])   . Thyroid disease    hypothyroidism  . Wears glasses     PAST SURGICAL HISTORY: Past Surgical History:  Procedure Laterality Date  . ABDOMINAL HYSTERECTOMY    . AXILLARY LYMPH NODE BIOPSY Right 08/21/2018   Procedure: RIGHT AXILLARY LYMPH NODE BIOPSY ERAS PATHWAY;  Surgeon: [name], MD;  Location: [place];  Service: General;  Laterality: Right;  LMA  . BREAST EXCISIONAL BIOPSY Right   . COLONOSCOPY    . IR IMAGING GUIDED PORT INSERTION  11/10/2018  . LYMPH NODE DISSECTION    . STRABISMUS SURGERY    . TONSILLECTOMY    . TOTAL KNEE ARTHROPLASTY Right 12/03/2016   Procedure: RIGHT TOTAL KNEE ARTHROPLASTY;  Surgeon: [name], MD;  Location: [facility];  Service: Orthopedics;  Laterality: Right;    FAMILY HISTORY: Family History  Problem Relation Age of Onset  . Liver cancer Mother   . Heart disease Father   . Breast cancer  Sister 33  . Colon cancer Neg Hx   . Pancreatic cancer Neg Hx   . Rectal cancer Neg Hx   . Stomach cancer Neg Hx    The patient's father died at the age of 75 from a myocardial infarction. The patient's mother died at the age of 44 from primary liver carcinoma. The patient had no brothers. Her one sister, [name], has a history of breast cancer   GYNECOLOGIC HISTORY: Menarche at around age 13. The patient is GX P0. She underwent simple hysterectomy without salpingo-oophorectomy in [place]: (Updated 02/13/2019)  She reports that  she recently retired from being a Therapist, sports at [employer], working in the [place], [place] x 5d/week. She lives by herself. She had two cats, [name] and [place], but they are currently with her sister and Chau thinks they would be more at home there. She attends a Investment [employer], operational.               ADVANCED DIRECTIVES: Completed 02/03/2019. She has named her niece her 87.   HEALTH MAINTENANCE: Social History   Socioeconomic History  . Marital status: Single    Spouse name: Not on file  . Number of children: Not on file  . Years of education: Not on file  . Highest education level: Not on file  Occupational History  . Not on file  Social Needs  . Financial resource strain: Not hard at all  . Food insecurity    Worry: Never true    Inability: Never true  . Transportation needs    Medical: No    Non-medical: No  Tobacco Use  . Smoking status: Never Smoker  . Smokeless tobacco: Never Used  Substance and Sexual Activity  . Alcohol use: Yes    Comment: occasional alcohol intake; once or twice monthly  . Drug use: No  . Sexual activity: Not on file  Lifestyle  . Physical activity    Days per week: 0 days    Minutes per session: 0 min  . Stress: Not at all  Relationships  . Social Herbalist on phone: Once a week    Gets together: Once a week    Attends religious service: Never    Active member of club or organization: No    Attends meetings of clubs or organizations: Never    Relationship status: Never married  . Intimate partner violence    Fear of current or ex partner: No    Emotionally abused: No    Physically abused: No    Forced sexual activity: No  Other Topics Concern  . Not on file  Social History Narrative  . Not on file                Colonoscopy: 06/01/2014/[facility]             PAP:             Bone density:             Lipid panel:  228/114/50/155/ratio 4.6  On 10/01/2016  ALLERGIES: Allergies  Allergen Reactions  . Iohexol Swelling and Rash  .  Compazine [Prochlorperazine Edisylate] Other (See Comments)    Mild confusion  . Contrast Media  [Iodinated Diagnostic Agents]   . Lactose Intolerance (Gi)     Diarrhea, gas bloating  . Cephalexin Rash    CURRENT MEDICATIONS: Current Outpatient Medications  Medication Sig Dispense Refill  . acetaminophen (TYLENOL 8 HOUR ARTHRITIS  PAIN) 650 MG CR tablet Take 650 mg by mouth every 8 (eight) hours as needed for pain.    Marland Kitchen acyclovir (ZOVIRAX) 200 MG capsule Take 1 capsule (200 mg total) by mouth 3 (three) times daily. 90 capsule 4  . allopurinol (ZYLOPRIM) 300 MG tablet Take 1 tablet (300 mg total) by mouth daily. 90 tablet 4  . ciprofloxacin (CIPRO) 500 MG tablet Take 1 tablet (500 mg total) by mouth 2 (two) times daily. 40 tablet 0  . doxycycline (VIBRA-TABS) 100 MG tablet Take 1 tablet (100 mg total) by mouth daily. 60 tablet 2  . furosemide (LASIX) 20 MG tablet TAKE 1 TABLET BY MOUTH EVERY DAY 90 tablet 1  . hydrochlorothiazide (HYDRODIURIL) 12.5 MG tablet Take 12.5 mg by mouth daily.    . hydrocortisone (ANUSOL-HC) 2.5 % rectal cream Place rectally 3 (three) times daily. 30 g 0  . KLOR-CON M20 20 MEQ tablet TAKE 1 TABLET BY MOUTH EVERY DAY 30 tablet 0  . lidocaine-prilocaine (EMLA) cream Apply to affected area once 30 g 3  . predniSONE (DELTASONE) 20 MG tablet Take 3 tablets (60 mg total) by mouth daily with breakfast. Take days 1-5 starting on CHOP chemo day 15 tablet 6  . rosuvastatin (CRESTOR) 10 MG tablet Take 10 mg by mouth at bedtime.  11  . SYNTHROID 88 MCG tablet Take 88 mcg by mouth daily before breakfast.      No current facility-administered medications for this visit.     OBJECTIVE: Vitals:   03/17/19 0852  BP: (!) 126/52  [name]: 86  Resp: 17  Temp: 98.9 F (37.2 C)  SpO2: 99%    ECOG FS: 1 GENERAL: Patient is a well appearing female in no acute distress HEENT:  Sclerae anicteric.  Oropharynx clear and moist. No ulcerations or evidence of oropharyngeal  candidiasis. Neck is supple.  NODES:  Continued cervical, supraclavicular, and axillary adenopathy, smaller than prior LUNGS:  Clear to auscultation bilaterally.  No wheezes or rhonchi. HEART:  Regular rate and rhythm. No murmur appreciated. ABDOMEN:  Soft, nontender.  Positive, normoactive bowel sounds. No organomegaly palpated. MSK:  No focal spinal tenderness to palpation EXTREMITIES:  Improved lower extremity edema SKIN:  Clear with no obvious rashes or skin changes. No nail dyscrasia. NEURO:  Nonfocal. Well oriented.  Appropriate affect.     LAB RESULTS: Lab Results  Component Value Date   WBC 4.9 03/17/2019   NEUTROABS PENDING 03/17/2019   HGB 11.6 (L) 03/17/2019   HCT 34.8 (L) 03/17/2019   MCV 94.8 03/17/2019   PLT 97 (L) 03/17/2019   CMP Latest Ref Rng & Units 02/24/2019 02/13/2019 02/03/2019  Glucose 70 - 99 mg/dL 93 168(H) 173(H)  BUN 8 - 23 mg/dL 22 16 15   Creatinine 0.44 - 1.00 mg/dL 1.11(H) 0.89 1.23(H)  Sodium 135 - 145 mmol/L 139 139 140  Potassium 3.5 - 5.1 mmol/L 4.2 3.8 3.9  Chloride 98 - 111 mmol/L 105 105 105  CO2 22 - 32 mmol/L 22 24 25   Calcium 8.9 - 10.3 mg/dL 9.3 9.0 9.2  Total Protein 6.5 - 8.1 g/dL 7.1 5.8(L) 6.2(L)  Total Bilirubin 0.3 - 1.2 mg/dL 0.4 0.7 0.6  Alkaline Phos 38 - 126 U/L 80 80 71  AST 15 - 41 U/L 18 12(L) 14(L)  ALT 0 - 44 U/L <6 <6 <6    No results for input(s): LABCA2 in the last 72 hours.   STUDIES: No results found.   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             (b) rituximab added to ibrutinib, first dose 08/27/2018             (c) hepatitis B studies 08/27/2018 negative             (f) ibrutinib/rituximab discontinued after 10/02/2018 dose w/o obvious response  (6) bendamustine/rituximab started 10/22/2018, discontinued after 1 cycle with rapid progression  (7) obinutuzumab/Gaziva started 11/11/2018  (a) second dose given 11/19/2018 together with chemotherapy  (b) third dose given 12/26/2018 and subsequent doses with chemotherapy  (8) CH[O]P chemotherapy started 11/19/2018             (a) echocardiogram on 11/07/2018 shows EF of 55-60%  (b) second cycle of CH[O]P postponed because of pandemic, given 12/26/2018, greatly reduced doses  (c) cycle 3 of CH[O]P/obinutuzumab given 02/03/2019 at increased doses  (d) cycle 4 of CH[O]P given on 6/16 and 7/7 at increased doses of 35mg /m2 and 550mg /m2  (9) severe immunocompromise: Marked hypogammaglobulinemia  (a) received IVIG 12/02/2018, repeated 12/17/2018 and 02/13/2019 (given every 8 weeks at this point)   PLAN:  Yong is doing well today.  She will proceed with her next cycle of treatment with Doxorubicin, Cyclophosphamide, Prednisone, Obinutuzumab today with onpro support.  We are slowly increasing her doses of treatment with this.   3 weeks ago she received slightly higher doses of the doxorubicin.  She is tolerating it well.  Today, her labs are stable, and her plt count is slightly down at 97.   I reviewed her lab work with  Dr. Jana Hakim.  We will keep her doses the same as on 02/24/2019.  She is ok to treat with her plt count of 97.  She will need a lab only in 1 week, and I have requested that be scheduled.  I reviewed Lacey's upcoming appointments with her.  I also requested flush appointments prior to her visits with [place], so that her labs can be drawn from her port.    I recommended that she continue with her current pandemic precautions.    We will see her back in1 week for lab, and in 3 weeks for labs, f/u and her next treatment.  She knows to call for any questions or concerns prior to her next appointment with [place].  A total of (30) minutes of face-to-face time was spent with this patient with greater than 50% of that time in counseling and care-coordination.   Wilber Bihari, NP  03/17/19 9:16 AM Medical Oncology  and Hematology [facility] [address] [place], [place] [address] Tel. [phone]    Fax. [phone]

## 2019-03-18 LAB — IGG, IGA, IGM
IgA: 21 mg/dL — ABNORMAL LOW (ref 87–352)
IgG (Immunoglobin G), Serum: 692 mg/dL (ref 586–1602)
IgM (Immunoglobulin M), Srm: <5 mg/dL — ABNORMAL LOW (ref 26–217)

## 2019-03-20 ENCOUNTER — Other Ambulatory Visit: Payer: Self-pay | Admitting: Oncology

## 2019-03-24 ENCOUNTER — Other Ambulatory Visit: Payer: Self-pay

## 2019-03-24 ENCOUNTER — Inpatient Hospital Stay: Payer: Medicare Other

## 2019-03-24 DIAGNOSIS — C9110 Chronic lymphocytic leukemia of B-cell type not having achieved remission: Secondary | ICD-10-CM

## 2019-03-24 DIAGNOSIS — Z5112 Encounter for antineoplastic immunotherapy: Secondary | ICD-10-CM | POA: Diagnosis not present

## 2019-03-24 LAB — CBC WITH DIFFERENTIAL/PLATELET
Abs Immature Granulocytes: 0 10*3/uL (ref 0.00–0.07)
Band Neutrophils: 9 %
Basophils Absolute: 0 10*3/uL (ref 0.0–0.1)
Basophils Relative: 1 %
Eosinophils Absolute: 0.1 10*3/uL (ref 0.0–0.5)
Eosinophils Relative: 2 %
HCT: 32.5 % — ABNORMAL LOW (ref 36.0–46.0)
Hemoglobin: 10.8 g/dL — ABNORMAL LOW (ref 12.0–15.0)
Lymphocytes Relative: 60 %
Lymphs Abs: 1.6 10*3/uL (ref 0.7–4.0)
MCH: 31.7 pg (ref 26.0–34.0)
MCHC: 33.2 g/dL (ref 30.0–36.0)
MCV: 95.3 fL (ref 80.0–100.0)
Monocytes Absolute: 0.1 10*3/uL (ref 0.1–1.0)
Monocytes Relative: 2 %
Neutro Abs: 0.9 10*3/uL — ABNORMAL LOW (ref 1.7–17.7)
Neutrophils Relative %: 26 %
Platelets: 50 10*3/uL — ABNORMAL LOW (ref 150–400)
RBC: 3.41 MIL/uL — ABNORMAL LOW (ref 3.87–5.11)
RDW: 14.1 % (ref 11.5–15.5)
WBC: 2.7 10*3/uL — ABNORMAL LOW (ref 4.0–10.5)
nRBC: 0 % (ref 0.0–0.2)

## 2019-03-24 LAB — COMPREHENSIVE METABOLIC PANEL
ALT: 9 U/L (ref 0–44)
AST: 10 U/L — ABNORMAL LOW (ref 15–41)
Albumin: 3.9 g/dL (ref 3.5–5.0)
Alkaline Phosphatase: 71 U/L (ref 38–126)
Anion gap: 9 (ref 5–15)
BUN: 25 mg/dL — ABNORMAL HIGH (ref 8–23)
CO2: 28 mmol/L (ref 22–32)
Calcium: 8.8 mg/dL — ABNORMAL LOW (ref 8.9–10.3)
Chloride: 101 mmol/L (ref 98–111)
Creatinine, Ser: 0.89 mg/dL (ref 0.44–1.00)
GFR calc Af Amer: >60 mL/min (ref >60)
GFR calc non Af Amer: >60 mL/min (ref >60)
Glucose, Bld: 145 mg/dL — ABNORMAL HIGH (ref 70–99)
Potassium: 3.7 mmol/L (ref 3.5–5.1)
Sodium: 138 mmol/L (ref 135–145)
Total Bilirubin: 0.5 mg/dL (ref 0.3–1.2)
Total Protein: 5.9 g/dL — ABNORMAL LOW (ref 6.5–8.1)

## 2019-03-24 LAB — LACTATE DEHYDROGENASE: LDH: 158 U/L (ref 98–192)

## 2019-03-25 LAB — IGG, IGA, IGM
IgA: 20 mg/dL — ABNORMAL LOW (ref 87–352)
IgG (Immunoglobin G), Serum: 672 mg/dL (ref 586–1602)
IgM (Immunoglobulin M), Srm: <5 mg/dL — ABNORMAL LOW (ref 26–217)

## 2019-04-06 ENCOUNTER — Other Ambulatory Visit: Payer: Self-pay | Admitting: *Deleted

## 2019-04-06 DIAGNOSIS — E7800 Pure hypercholesterolemia, unspecified: Secondary | ICD-10-CM

## 2019-04-07 ENCOUNTER — Encounter: Payer: Self-pay | Admitting: Oncology

## 2019-04-07 ENCOUNTER — Other Ambulatory Visit: Payer: Self-pay

## 2019-04-07 ENCOUNTER — Inpatient Hospital Stay: Payer: Medicare Other

## 2019-04-07 ENCOUNTER — Ambulatory Visit: Payer: Medicare Other

## 2019-04-07 ENCOUNTER — Inpatient Hospital Stay: Payer: Medicare Other | Admitting: Adult Health

## 2019-04-07 ENCOUNTER — Encounter: Payer: Self-pay | Admitting: Adult Health

## 2019-04-07 VITALS — BP 116/55 | HR 72 | Temp 97.8°F | Resp 18

## 2019-04-07 VITALS — BP 124/62 | HR 75 | Temp 97.8°F | Resp 18 | Ht 62.0 in | Wt 172.3 lb

## 2019-04-07 DIAGNOSIS — G620 Drug-induced polyneuropathy: Secondary | ICD-10-CM | POA: Diagnosis not present

## 2019-04-07 DIAGNOSIS — Z5112 Encounter for antineoplastic immunotherapy: Secondary | ICD-10-CM | POA: Diagnosis not present

## 2019-04-07 DIAGNOSIS — M7989 Other specified soft tissue disorders: Secondary | ICD-10-CM | POA: Diagnosis not present

## 2019-04-07 DIAGNOSIS — D801 Nonfamilial hypogammaglobulinemia: Secondary | ICD-10-CM | POA: Diagnosis not present

## 2019-04-07 DIAGNOSIS — C9110 Chronic lymphocytic leukemia of B-cell type not having achieved remission: Secondary | ICD-10-CM | POA: Diagnosis not present

## 2019-04-07 DIAGNOSIS — Z95828 Presence of other vascular implants and grafts: Secondary | ICD-10-CM

## 2019-04-07 DIAGNOSIS — E7800 Pure hypercholesterolemia, unspecified: Secondary | ICD-10-CM

## 2019-04-07 LAB — COMPREHENSIVE METABOLIC PANEL
ALT: 7 U/L (ref 0–44)
AST: 16 U/L (ref 15–41)
Albumin: 3.8 g/dL (ref 3.5–5.0)
Alkaline Phosphatase: 75 U/L (ref 38–126)
Anion gap: 7 (ref 5–15)
BUN: 22 mg/dL (ref 8–23)
CO2: 26 mmol/L (ref 22–32)
Calcium: 9.2 mg/dL (ref 8.9–10.3)
Chloride: 105 mmol/L (ref 98–111)
Creatinine, Ser: 0.93 mg/dL (ref 0.44–1.00)
GFR calc Af Amer: >60 mL/min (ref >60)
GFR calc non Af Amer: >60 mL/min (ref >60)
Glucose, Bld: 122 mg/dL — ABNORMAL HIGH (ref 70–99)
Potassium: 3.7 mmol/L (ref 3.5–5.1)
Sodium: 138 mmol/L (ref 135–145)
Total Bilirubin: 0.5 mg/dL (ref 0.3–1.2)
Total Protein: 6.1 g/dL — ABNORMAL LOW (ref 6.5–8.1)

## 2019-04-07 LAB — CBC WITH DIFFERENTIAL/PLATELET
Abs Immature Granulocytes: 0.02 10*3/uL (ref 0.00–0.07)
Basophils Absolute: 0 10*3/uL (ref 0.0–0.1)
Basophils Relative: 1 %
Eosinophils Absolute: 0.1 10*3/uL (ref 0.0–0.5)
Eosinophils Relative: 2 %
HCT: 33.8 % — ABNORMAL LOW (ref 36.0–46.0)
Hemoglobin: 11.2 g/dL — ABNORMAL LOW (ref 12.0–15.0)
Immature Granulocytes: 1 %
Lymphocytes Relative: 26 %
Lymphs Abs: 1 10*3/uL (ref 0.7–4.0)
MCH: 31.5 pg (ref 26.0–34.0)
MCHC: 33.1 g/dL (ref 30.0–36.0)
MCV: 94.9 fL (ref 80.0–100.0)
Monocytes Absolute: 0.5 10*3/uL (ref 0.1–1.0)
Monocytes Relative: 13 %
Neutro Abs: 2.3 10*3/uL (ref 1.7–7.7)
Neutrophils Relative %: 57 %
Platelets: 120 10*3/uL — ABNORMAL LOW (ref 150–400)
RBC: 3.56 MIL/uL — ABNORMAL LOW (ref 3.87–5.11)
RDW: 14.2 % (ref 11.5–15.5)
WBC: 4 10*3/uL (ref 4.0–10.5)
nRBC: 0 % (ref 0.0–0.2)

## 2019-04-07 LAB — LIPID PANEL
Cholesterol: 124 mg/dL (ref 0–200)
HDL: 27 mg/dL — ABNORMAL LOW (ref >40)
LDL Cholesterol: 75 mg/dL (ref 0–99)
Total CHOL/HDL Ratio: 4.6 RATIO
Triglycerides: 109 mg/dL (ref <150)
VLDL: 22 mg/dL (ref 0–40)

## 2019-04-07 LAB — LACTATE DEHYDROGENASE: LDH: 270 U/L — ABNORMAL HIGH (ref 98–192)

## 2019-04-07 MED ORDER — PALONOSETRON HCL INJECTION 0.25 MG/5ML
INTRAVENOUS | Status: AC
  Filled 2019-04-07: qty 5

## 2019-04-07 MED ORDER — HEPARIN SOD (PORK) LOCK FLUSH 100 UNIT/ML IV SOLN
500.0000 [IU] | Freq: Once | INTRAVENOUS | Status: AC | PRN
  Administered 2019-04-07: 500 [IU]
  Filled 2019-04-07: qty 5

## 2019-04-07 MED ORDER — SODIUM CHLORIDE 0.9 % IV SOLN
Freq: Once | INTRAVENOUS | Status: AC
  Administered 2019-04-07: 11:00:00 via INTRAVENOUS
  Filled 2019-04-07: qty 5

## 2019-04-07 MED ORDER — SODIUM CHLORIDE 0.9 % IV SOLN
Freq: Once | INTRAVENOUS | Status: AC
  Administered 2019-04-07: 11:00:00 via INTRAVENOUS
  Filled 2019-04-07: qty 250

## 2019-04-07 MED ORDER — SODIUM CHLORIDE 0.9% FLUSH
10.0000 mL | INTRAVENOUS | Status: DC | PRN
  Administered 2019-04-07: 17:00:00 10 mL
  Filled 2019-04-07: qty 10

## 2019-04-07 MED ORDER — DOXORUBICIN HCL CHEMO IV INJECTION 2 MG/ML
35.0000 mg/m2 | Freq: Once | INTRAVENOUS | Status: AC
  Administered 2019-04-07: 70 mg via INTRAVENOUS
  Filled 2019-04-07: qty 35

## 2019-04-07 MED ORDER — DIPHENHYDRAMINE HCL 50 MG/ML IJ SOLN
INTRAMUSCULAR | Status: AC
  Filled 2019-04-07: qty 1

## 2019-04-07 MED ORDER — SODIUM CHLORIDE 0.9 % IV SOLN
1000.0000 mg | Freq: Once | INTRAVENOUS | Status: AC
  Administered 2019-04-07: 1000 mg via INTRAVENOUS
  Filled 2019-04-07: qty 40

## 2019-04-07 MED ORDER — PEGFILGRASTIM 6 MG/0.6ML ~~LOC~~ PSKT
PREFILLED_SYRINGE | SUBCUTANEOUS | Status: AC
  Filled 2019-04-07: qty 0.6

## 2019-04-07 MED ORDER — DEXAMETHASONE SODIUM PHOSPHATE 10 MG/ML IJ SOLN
INTRAMUSCULAR | Status: AC
  Filled 2019-04-07: qty 1

## 2019-04-07 MED ORDER — DIPHENHYDRAMINE HCL 50 MG/ML IJ SOLN
25.0000 mg | Freq: Once | INTRAMUSCULAR | Status: AC
  Administered 2019-04-07: 25 mg via INTRAVENOUS

## 2019-04-07 MED ORDER — SODIUM CHLORIDE 0.9 % IV SOLN
550.0000 mg/m2 | Freq: Once | INTRAVENOUS | Status: AC
  Administered 2019-04-07: 1100 mg via INTRAVENOUS
  Filled 2019-04-07: qty 55

## 2019-04-07 MED ORDER — SODIUM CHLORIDE 0.9% FLUSH
10.0000 mL | INTRAVENOUS | Status: DC | PRN
  Administered 2019-04-07: 10 mL via INTRAVENOUS
  Filled 2019-04-07: qty 10

## 2019-04-07 MED ORDER — PEGFILGRASTIM 6 MG/0.6ML ~~LOC~~ PSKT
6.0000 mg | PREFILLED_SYRINGE | Freq: Once | SUBCUTANEOUS | Status: AC
  Administered 2019-04-07: 6 mg via SUBCUTANEOUS

## 2019-04-07 MED ORDER — ACETAMINOPHEN 325 MG PO TABS
ORAL_TABLET | ORAL | Status: AC
  Filled 2019-04-07: qty 2

## 2019-04-07 MED ORDER — DEXAMETHASONE SODIUM PHOSPHATE 10 MG/ML IJ SOLN
10.0000 mg | Freq: Once | INTRAMUSCULAR | Status: AC
  Administered 2019-04-07: 10 mg via INTRAVENOUS

## 2019-04-07 MED ORDER — PALONOSETRON HCL INJECTION 0.25 MG/5ML
0.2500 mg | Freq: Once | INTRAVENOUS | Status: AC
  Administered 2019-04-07: 0.25 mg via INTRAVENOUS

## 2019-04-07 MED ORDER — ACETAMINOPHEN 325 MG PO TABS
650.0000 mg | ORAL_TABLET | Freq: Once | ORAL | Status: AC
  Administered 2019-04-07: 650 mg via ORAL

## 2019-04-07 NOTE — Patient Instructions (Signed)

## 2019-04-07 NOTE — Patient Instructions (Signed)
**Per Mendel Ryder Causey-NP: Come back in 1 week for lab only**  [facility] Discharge Instructions for Patients Receiving Chemotherapy  Today you received the following chemotherapy agents: Doxorubicin (Adriamycin), Cyclophosphamide (Cytoxan), and Obinutuzumab Dyann Kief)  To help prevent nausea and vomiting after your treatment, we encourage you to take your nausea medication as directed.   If you develop nausea and vomiting that is not controlled by your nausea medication, call the clinic.   BELOW ARE SYMPTOMS THAT SHOULD BE REPORTED IMMEDIATELY:  *FEVER GREATER THAN 100.5 F  *CHILLS WITH OR WITHOUT FEVER  NAUSEA AND VOMITING THAT IS NOT CONTROLLED WITH YOUR NAUSEA MEDICATION  *UNUSUAL SHORTNESS OF BREATH  *UNUSUAL BRUISING OR BLEEDING  TENDERNESS IN MOUTH AND THROAT WITH OR WITHOUT PRESENCE OF ULCERS  *URINARY PROBLEMS  *BOWEL PROBLEMS  UNUSUAL RASH Items with * indicate a potential emergency and should be followed up as soon as possible.  Feel free to call the clinic should you have any questions or concerns. The clinic phone number is [phone].  Please show the [place] at check-in to the Emergency Department and triage nurse.  Coronavirus (COVID-19) Are you at risk?  Are you at risk for the Coronavirus (COVID-19)?  To be considered HIGH RISK for Coronavirus (COVID-19), you have to meet the following criteria:  . Traveled to [place], [place], [place], [place] or [place]; or in the [place] to [place], [place], [place], or [place]; and have fever, cough, and shortness of breath within the last 2 weeks of travel OR . Been in close contact with a person diagnosed with COVID-19 within the last 2 weeks and have fever, cough, and shortness of breath . IF YOU DO NOT MEET THESE CRITERIA, YOU ARE CONSIDERED LOW RISK FOR COVID-19.  What to do if you are HIGH RISK for COVID-19?  Marland Kitchen If you are having a medical emergency, call 911. . Seek  medical care right away. Before you go to a doctor's office, urgent care or emergency department, call ahead and tell them about your recent travel, contact with someone diagnosed with COVID-19, and your symptoms. You should receive instructions from your physician's office regarding next steps of care.  . When you arrive at healthcare provider, tell the healthcare staff immediately you have returned from visiting [place], [place], [place], [place] or [place]; or traveled in the [place] to [place], [place], [place], or [place]; in the last two weeks or you have been in close contact with a person diagnosed with COVID-19 in the last 2 weeks.   . Tell the health care staff about your symptoms: fever, cough and shortness of breath. . After you have been seen by a medical provider, you will be either: o Tested for (COVID-19) and discharged home on quarantine except to seek medical care if symptoms worsen, and asked to  - Stay home and avoid contact with others until you get your results (4-5 days)  - Avoid travel on public transportation if possible (such as bus, train, or airplane) or o Sent to the Emergency Department by EMS for evaluation, COVID-19 testing, and possible admission depending on your condition and test results.  What to do if you are LOW RISK for COVID-19?  Reduce your risk of any infection by using the same precautions used for avoiding the common cold or flu:  Marland Kitchen Wash your hands often with soap and warm water for at least 20 seconds.  If soap and water are not  readily available, use an alcohol-based hand sanitizer with at least 60% alcohol.  . If coughing or sneezing, cover your mouth and nose by coughing or sneezing into the elbow areas of your shirt or coat, into a tissue or into your sleeve (not your hands). . Avoid shaking hands with others and consider head nods or verbal greetings only. . Avoid touching your eyes, nose, or mouth with unwashed hands.  . Avoid close  contact with people who are sick. . Avoid places or events with large numbers of people in one location, like concerts or sporting events. . Carefully consider travel plans you have or are making. . If you are planning any travel outside or inside the [place], visit the CDC's Travelers' Health webpage for the latest health notices. . If you have some symptoms but not all symptoms, continue to monitor at home and seek medical attention if your symptoms worsen. . If you are having a medical emergency, call 911.   [place] / e-Visit: [email or web address]         [facility]: [facility]: [phone]                   [facility]: [phone]

## 2019-04-07 NOTE — Progress Notes (Signed)
Maria [name]  [phone] [phone]    ID: Maria [name]   DOB: October 28, 1950  MR#: [id number]  GOT#:[id number]  Patient Care Team: Marton Redwood, MD as PCP - General (Internal Medicine) [name], MD as Consulting Physician (Orthopedic Surgery) [name], [name] Dad, MD as Consulting Physician (Oncology) OTHER MD:   CHIEF COMPLAINT: Small lymphocytic lymphoma  CURRENT TREATMENT: CHOP/obinutuzumab, IVIG   INTERVAL HISTORY: [name] returns today for follow-up and treatment of her chronic lymphoid leukemia/small cell lymphocytic lymphoma.   She continues on CHOP/obinutuzumab (without Vincristine due to peripheral neuropathy). She receives this every three weeks.    She also receives IVIG for her severe humoral immunosuppression. She receives this every 8 weeks.  This appears to be due again on 04/16/2019.  REVIEW OF SYSTEMS: Saira is doing well today.  She had some right upper arm swelling that has since improved.  Her weight has remained in the 170s.  She notes she was fasting th is morning for her weight today.    Vaidehi notes her hair is coming back in.  She is feeling well.  During the day she notes that she does stuff around the house to stay active.  She is practicing appropriate pandemic precautions.    Jerrell is otherwise feeling well.  She has persistent cervical adenopathy.  She denies any fever, chills, increased fatigue, increased night sweats, or continued unintentional weight loss.  She is without any bowel/bladder issues, nausea or vomiting, mucositis, dysphagia, or skin rash or lesion.  A detailed ROS was otherwise non contributory.     HISTORY OF PRESENT ILLNESS: From the original intake note:  [name] developed what she thought was "the flu" in December of 2002. She noted a large lymph node developing in her right anterior cervical area. She was treated with antibiotics x2 before the tumor was eventually biopsied  and shown to be chronic lymphoid leukemia.   Her subsequent history is as detailed above.   PAST MEDICAL HISTORY: Past Medical History:  Diagnosis Date  . Allergy   . Arthritis   . Asthma   . Cancer [facility])    chronic lymphacytic leukemia  . CLL (chronic lymphocytic leukemia) ([place])   . Diabetes mellitus    PMH  . Headache    migraine  . Hyperlipidemia   . Hypertension   . Hypothyroidism   . Lymphoma ([place])   . Thyroid disease    hypothyroidism  . Wears glasses     PAST SURGICAL HISTORY: Past Surgical History:  Procedure Laterality Date  . ABDOMINAL HYSTERECTOMY    . AXILLARY LYMPH NODE BIOPSY Right 08/21/2018   Procedure: RIGHT AXILLARY LYMPH NODE BIOPSY ERAS PATHWAY;  Surgeon: [name], MD;  Location: [facility];  Service: General;  Laterality: Right;  LMA  . BREAST EXCISIONAL BIOPSY Right   . COLONOSCOPY    . IR IMAGING GUIDED PORT INSERTION  11/10/2018  . LYMPH NODE DISSECTION    . STRABISMUS SURGERY    . TONSILLECTOMY    . TOTAL KNEE ARTHROPLASTY Right 12/03/2016   Procedure: RIGHT TOTAL KNEE ARTHROPLASTY;  Surgeon: [name], MD;  Location: [facility];  Service: Orthopedics;  Laterality: Right;    FAMILY HISTORY: Family History  Problem Relation Age of Onset  . Liver cancer Mother   . Heart disease Father   . Breast cancer Sister 36  . Colon cancer Neg Hx   . Pancreatic cancer Neg Hx   . Rectal cancer Neg Hx   . Stomach cancer  Neg Hx    The patient's father died at the age of 58 from a myocardial infarction. The patient's mother died at the age of 42 from primary liver carcinoma. The patient had no brothers. Her one sister, Maria [name], has a history of breast cancer   GYNECOLOGIC HISTORY: Menarche at around age 19. The patient is GX P0. 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 She lives by herself. She had two cats, [name] and Glendale, but they are currently with her sister and Dalasia thinks they would be more at home there. She attends a [employer], operational.               ADVANCED DIRECTIVES: Completed 02/03/2019. She has named her niece her 36.   HEALTH MAINTENANCE: Social History   Socioeconomic History  . Marital status: Single    Spouse name: Not on file  . Number of children: Not on file  . Years of education: Not on file  . Highest education level: Not on file  Occupational History  . Not on file  Social Needs  . Financial resource strain: Not hard at all  . Food insecurity    Worry: Never true    Inability: Never true  . Transportation needs    Medical: No    Non-medical: No  Tobacco Use  . Smoking status: Never Smoker  . Smokeless tobacco: Never Used  Substance and Sexual Activity  . Alcohol use: Yes    Comment: occasional alcohol intake; once or twice monthly  . Drug use: No  . Sexual activity: Not on file  Lifestyle  . Physical activity    Days per week: 0 days    Minutes per session: 0 min  . Stress: Not at all  Relationships  . Social Herbalist on phone: Once a week    Gets together: Once a week    Attends religious service: Never    Active member of club or organization: No    Attends meetings of clubs or organizations: Never    Relationship status: Never married  . Intimate partner violence    Fear of current or ex partner: No    Emotionally abused: No    Physically abused: No    Forced sexual activity: No  Other Topics Concern  . Not on file  Social History Narrative  . Not on file                Colonoscopy: 06/01/2014/[facility]             PAP:             Bone density:             Lipid panel:  228/114/50/155/ratio 4.6  On 10/01/2016  ALLERGIES: Allergies  Allergen Reactions  . Iohexol Swelling and Rash  . Compazine [Prochlorperazine Edisylate] Other (See Comments)    Mild confusion   . Contrast Media  [Iodinated Diagnostic Agents]   . Lactose Intolerance (Gi)     Diarrhea, gas bloating  . Cephalexin Rash    CURRENT MEDICATIONS: Current Outpatient Medications  Medication Sig Dispense Refill  . acetaminophen (TYLENOL 8 HOUR ARTHRITIS PAIN) 650 MG CR tablet Take 650 mg by mouth every 8 (eight) hours as needed for pain.    Marland Kitchen acyclovir (ZOVIRAX) 200 MG capsule  TAKE 1 CAPSULE (200 MG TOTAL) BY MOUTH 3 (THREE) TIMES DAILY. 270 capsule 1  . allopurinol (ZYLOPRIM) 300 MG tablet Take 1 tablet (300 mg total) by mouth daily. 90 tablet 4  . ciprofloxacin (CIPRO) 500 MG tablet Take 1 tablet (500 mg total) by mouth 2 (two) times daily. 40 tablet 0  . doxycycline (VIBRA-TABS) 100 MG tablet Take 1 tablet (100 mg total) by mouth daily. 60 tablet 2  . furosemide (LASIX) 20 MG tablet TAKE 1 TABLET BY MOUTH EVERY DAY 90 tablet 1  . hydrochlorothiazide (HYDRODIURIL) 12.5 MG tablet Take 12.5 mg by mouth daily.    . hydrocortisone (ANUSOL-HC) 2.5 % rectal cream Place rectally 3 (three) times daily. 30 g 0  . KLOR-CON M20 20 MEQ tablet TAKE 1 TABLET BY MOUTH EVERY DAY 30 tablet 0  . lidocaine-prilocaine (EMLA) cream Apply to affected area once 30 g 3  . predniSONE (DELTASONE) 20 MG tablet Take 3 tablets (60 mg total) by mouth daily with breakfast. Take days 1-5 starting on CHOP chemo day 15 tablet 6  . rosuvastatin (CRESTOR) 10 MG tablet Take 10 mg by mouth at bedtime.  11  . SYNTHROID 88 MCG tablet Take 88 mcg by mouth daily before breakfast.      No current facility-administered medications for this visit.     OBJECTIVE: Vitals:   04/07/19 0937 04/07/19 0945  BP: (!) 125/37 124/62  Pulse: 75   Resp: 18   Temp: 97.8 F (36.6 C)   SpO2: 100%     ECOG FS: 1 GENERAL: Patient is a well appearing female in no acute distress HEENT:  Sclerae anicteric.  Oropharynx clear and moist. No ulcerations or evidence of oropharyngeal candidiasis. Neck is supple.  NODES:  Continued cervical,  supraclavicular, and axillary adenopathy, ? Slightly larger than three weeks ago LUNGS:  Clear to auscultation bilaterally.  No wheezes or rhonchi. HEART:  Regular rate and rhythm. No murmur appreciated. ABDOMEN:  Soft, nontender.  Positive, normoactive bowel sounds. No organomegaly palpated. MSK:  No focal spinal tenderness to palpation EXTREMITIES:  Improved lower extremity edema, though scant to 1+ remains SKIN:  Clear with no obvious rashes or skin changes. No nail dyscrasia. NEURO:  Nonfocal. Well oriented.  Appropriate affect.     LAB RESULTS: Lab Results  Component Value Date   WBC 4.0 04/07/2019   NEUTROABS 2.3 04/07/2019   HGB 11.2 (L) 04/07/2019   HCT 33.8 (L) 04/07/2019   MCV 94.9 04/07/2019   PLT 120 (L) 04/07/2019   CMP Latest Ref Rng & Units 04/07/2019 03/24/2019 03/17/2019  Glucose 70 - 99 mg/dL 122(H) 145(H) 144(H)  BUN 8 - 23 mg/dL 22 25(H) 18  Creatinine 0.44 - 1.00 mg/dL 0.93 0.89 0.95  Sodium 135 - 145 mmol/L 138 138 141  Potassium 3.5 - 5.1 mmol/L 3.7 3.7 3.4(L)  Chloride 98 - 111 mmol/L 105 101 107  CO2 22 - 32 mmol/L 26 28 24   Calcium 8.9 - 10.3 mg/dL 9.2 8.8(L) 8.5(L)  Total Protein 6.5 - 8.1 g/dL 6.1(L) 5.9(L) 6.2(L)  Total Bilirubin 0.3 - 1.2 mg/dL 0.5 0.5 0.5  Alkaline Phos 38 - 126 U/L 75 71 70  AST 15 - 41 U/L 16 10(L) 14(L)  ALT 0 - 44 U/L 7 9 <6    No results for input(s): LABCA2 in the last 72 hours.   STUDIES: No results found.   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             (b) rituximab added to ibrutinib, first dose 08/27/2018             (c) hepatitis B studies 08/27/2018 negative             (f) ibrutinib/rituximab discontinued after 10/02/2018 dose w/o obvious response  (6) bendamustine/rituximab started 10/22/2018, discontinued after 1 cycle with rapid progression  (7) obinutuzumab/Gaziva started 11/11/2018  (a) second dose given 11/19/2018 together with chemotherapy  (b) third dose given 12/26/2018 and subsequent doses with chemotherapy  (8) CH[O]P chemotherapy started 11/19/2018             (a) echocardiogram on 11/07/2018 shows EF of 55-60%  (b) second cycle of CH[O]P postponed because of pandemic, given 12/26/2018, greatly reduced doses  (c) cycle 3 of CH[O]P/obinutuzumab given 02/03/2019 at increased doses  (d) cycle 4 of CH[O]P given on 6/16 and 7/7 at increased doses of 35mg /m2 and 550mg /m2  (9) severe immunocompromise: Marked hypogammaglobulinemia  (a) received IVIG 12/02/2018, repeated 12/17/2018 and 02/13/2019 (given every 8 weeks at this point)   PLAN:  Zoua is doing well today. Her labs are stable.  She will receive her treatment today.  Per Dr. Jana Hakim, we will continue at doses of 35mg /m2 and 550mg /m2.  She is not receiving Vincristine due to baseline neuropathy.  She will also receive the Obinutuzumab today.  Jailin was recommended to continue with her regular activities with appropriate pandemic precautions.    She is seeing her PCP next week.  She wants to know if we  have any recommendations on immunizations.  Since she is seeing Dr. Jana Hakim after her visit with her PCP, I recommended she bring a list of what immunizations her PCP recommends, and he can review with her which ones she can get, and which ones not to get.  She plans to do this.  Riniyah will return in 1 week for labs, f/u with Dr. Jana Hakim, and IVIG.  She knows to call for any questions or concerns prior to her next appointment with [place].  A total of (20) minutes of face-to-face time was spent with this patient with greater than 50% of that time in counseling and care-coordination.   Wilber Bihari, NP  04/07/19 10:02 AM Medical Oncology and Hematology [facility] [address] [place], [place] [address] Tel. [phone]    Fax. [phone]

## 2019-04-08 ENCOUNTER — Telehealth: Payer: Self-pay | Admitting: Adult Health

## 2019-04-08 LAB — IGG, IGA, IGM
IgA: 21 mg/dL — ABNORMAL LOW (ref 87–352)
IgG (Immunoglobin G), Serum: 525 mg/dL — ABNORMAL LOW (ref 586–1602)
IgM (Immunoglobulin M), Srm: <5 mg/dL — ABNORMAL LOW (ref 26–217)

## 2019-04-08 NOTE — Telephone Encounter (Signed)
I talk with patient regarding schedule  

## 2019-04-15 ENCOUNTER — Other Ambulatory Visit: Payer: Self-pay | Admitting: Oncology

## 2019-04-15 NOTE — Progress Notes (Signed)
Maria [name]  [phone] Fax:([phone]) [phone]    ID: Maria [name]   DOB: 27-Dec-1950  MR#: [id number]  BJS#:[id number]  Patient Care Team: Maria Redwood, [name] as PCP - General (Internal Medicine) Maria Arabian, [name] as Consulting Physician (Orthopedic Surgery) , Maria Dad, [name] as Consulting Physician (Oncology) Maria Skates, [name] as Consulting Physician (General Surgery) Causey, Maria Massed, Maria [name] as Nurse Practitioner (Hematology and Oncology) Maria [name]:   CHIEF COMPLAINT: Small lymphocytic lymphoma  CURRENT TREATMENT: CHOP/obinutuzumab, IVIG   INTERVAL HISTORY: Maria [name] returns today for follow-up and treatment of her chronic lymphoid leukemia/small cell lymphocytic lymphoma.   She continues on CHOP/obinutuzumab (without Vincristine due to peripheral neuropathy). She receives this every three weeks, with her last dose on 04/07/2019.  Today is day 10 cycle 6. She reports she is full of energy for four days following this, noting she didn't sleep the first night.     She also receives IVIG for her severe humoral immunosuppression. She receives this every 8 weeks.    REVIEW OF SYSTEMS: Tally reports she is walking around the house for exercise.  She believes are her all lymph nodes are little bit smaller.  She has not been having fevers, has not been having unusual fatigue (on the contrary she gets revved up with the steroids), and she has lost some weight, but she tells me this is due to her much better diet.  She is very pleased with her cholesterol readings as a result.  She is being very careful with the pandemic.  A detailed review of systems was otherwise entirely negative.   HISTORY OF PRESENT ILLNESS: From the original intake note:  Maria [name] developed what she thought was "the flu" in December of 2002. She noted a large lymph node developing in her right anterior cervical area. She was treated with antibiotics x2 before the tumor was  eventually biopsied and shown to be chronic lymphoid leukemia.   Her subsequent history is as detailed above.   PAST MEDICAL HISTORY: Past Medical History:  Diagnosis Date  . Allergy   . Arthritis   . Asthma   . Cancer [facility])    chronic lymphacytic leukemia  . CLL (chronic lymphocytic leukemia) ([place])   . Diabetes mellitus    PMH  . Headache    migraine  . Hyperlipidemia   . Hypertension   . Hypothyroidism   . Lymphoma ([place])   . Thyroid disease    hypothyroidism  . Wears glasses     PAST SURGICAL HISTORY: Past Surgical History:  Procedure Laterality Date  . ABDOMINAL HYSTERECTOMY    . AXILLARY LYMPH NODE BIOPSY Right 08/21/2018   Procedure: RIGHT AXILLARY LYMPH NODE BIOPSY ERAS PATHWAY;  Surgeon: Maria Skates, [name];  Location: [place];  Service: General;  Laterality: Right;  LMA  . BREAST EXCISIONAL BIOPSY Right   . COLONOSCOPY    . IR IMAGING GUIDED PORT INSERTION  11/10/2018  . LYMPH NODE DISSECTION    . STRABISMUS SURGERY    . TONSILLECTOMY    . TOTAL KNEE ARTHROPLASTY Right 12/03/2016   Procedure: RIGHT TOTAL KNEE ARTHROPLASTY;  Surgeon: Maria Arabian, [name];  Location: [facility];  Service: Orthopedics;  Laterality: Right;    FAMILY HISTORY: Family History  Problem Relation Age of Onset  . Liver cancer Mother   . Heart disease Father   . Breast cancer Sister 51  . Colon cancer Neg Hx   . Pancreatic cancer Neg Hx   . Rectal cancer Neg Hx   .  Stomach cancer Neg Hx    The patient's father died at the age of 51 from a myocardial infarction. The patient's mother died at the age of 69 from primary liver carcinoma. The patient had no brothers. Her one sister, Maria [name], has a history of breast cancer   GYNECOLOGIC HISTORY: Menarche at around age 77. The patient is GX P0. 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 She lives by herself. She had two cats, Maria [name] and Maria [name], but they are currently with her sister and Maria [name] thinks they would be more at home there. She attends a [employer], operational.               ADVANCED DIRECTIVES: Completed 02/03/2019. She has named her niece her 72.   HEALTH MAINTENANCE: Social History   Socioeconomic History  . Marital status: Single    Spouse name: Not on file  . Number of children: Not on file  . Years of education: Not on file  . Highest education level: Not on file  Occupational History  . Not on file  Social Needs  . Financial resource strain: Not hard at all  . Food insecurity    Worry: Never true    Inability: Never true  . Transportation needs    Medical: No    Non-medical: No  Tobacco Use  . Smoking status: Never Smoker  . Smokeless tobacco: Never Used  Substance and Sexual Activity  . Alcohol use: Yes    Comment: occasional alcohol intake; once or twice monthly  . Drug use: No  . Sexual activity: Not on file  Lifestyle  . Physical activity    Days per week: 0 days    Minutes per session: 0 min  . Stress: Not at all  Relationships  . Social Herbalist on phone: Once a week    Gets together: Once a week    Attends religious service: Never    Active member of club or organization: No    Attends meetings of clubs or organizations: Never    Relationship status: Never married  . Intimate partner violence    Fear of current or ex partner: No    Emotionally abused: No    Physically abused: No    Forced sexual activity: No  Maria Topics Concern  . Not on file  Social History Narrative  . Not on file                Colonoscopy: 06/01/2014/[facility]             PAP:             Bone density:             Lipid panel:  228/114/50/155/ratio 4.6  On 10/01/2016  ALLERGIES: Allergies  Allergen Reactions  . Iohexol Swelling and Rash  . Compazine [Prochlorperazine Edisylate] Maria (See Comments)     Mild confusion  . Contrast Media  [Iodinated Diagnostic Agents]   . Lactose Intolerance (Gi)     Diarrhea, gas bloating  . Cephalexin Rash    CURRENT MEDICATIONS: Current Outpatient Medications  Medication Sig Dispense Refill  . acetaminophen (TYLENOL 8 HOUR ARTHRITIS PAIN) 650 MG CR tablet Take 650 mg by mouth every 8 (eight) hours as needed for pain.    Marland Kitchen acyclovir (ZOVIRAX) 200  MG capsule TAKE 1 CAPSULE (200 MG TOTAL) BY MOUTH 3 (THREE) TIMES DAILY. 270 capsule 1  . allopurinol (ZYLOPRIM) 300 MG tablet Take 1 tablet (300 mg total) by mouth daily. 90 tablet 4  . ciprofloxacin (CIPRO) 500 MG tablet Take 1 tablet (500 mg total) by mouth 2 (two) times daily. 40 tablet 0  . doxycycline (VIBRA-TABS) 100 MG tablet Take 1 tablet (100 mg total) by mouth daily. 60 tablet 2  . furosemide (LASIX) 20 MG tablet TAKE 1 TABLET BY MOUTH EVERY DAY 90 tablet 1  . hydrochlorothiazide (HYDRODIURIL) 12.5 MG tablet Take 12.5 mg by mouth daily.    . hydrocortisone (ANUSOL-HC) 2.5 % rectal cream Place rectally 3 (three) times daily. 30 g 0  . KLOR-CON M20 20 MEQ tablet TAKE 1 TABLET BY MOUTH EVERY DAY 30 tablet 0  . lidocaine-prilocaine (EMLA) cream Apply to affected area once 30 g 3  . predniSONE (DELTASONE) 20 MG tablet Take 3 tablets (60 mg total) by mouth daily with breakfast. Take days 1-5 starting on CHOP chemo day 15 tablet 6  . rosuvastatin (CRESTOR) 10 MG tablet Take 10 mg by mouth at bedtime.  11  . SYNTHROID 88 MCG tablet Take 88 mcg by mouth daily before breakfast.      No current facility-administered medications for this visit.     OBJECTIVE: Middle-aged white woman who appears stated age Vitals:   04/16/19 0940  BP: (!) 116/41  [name]: 86  Resp: 18  Temp: 97.8 F (36.6 C)  SpO2: 98%    ECOG FS: 1  Sclerae unicteric, EOMs intact Wearing a mask No cervical or supraclavicular adenopathy Lungs no rales or rhonchi Heart regular rate and rhythm Abd soft, nontender, positive bowel  sounds MSK no focal spinal tenderness, no upper extremity lymphedema Neuro: nonfocal, well oriented, appropriate affect Breasts: Deferred   LAB RESULTS: Lab Results  Component Value Date   WBC 2.3 (L) 04/16/2019   NEUTROABS PENDING 04/16/2019   HGB 10.4 (L) 04/16/2019   HCT 31.6 (L) 04/16/2019   MCV 94.6 04/16/2019   PLT 63 (L) 04/16/2019   CMP Latest Ref Rng & Units 04/07/2019 03/24/2019 03/17/2019  Glucose 70 - 99 mg/dL 122(H) 145(H) 144(H)  BUN 8 - 23 mg/dL 22 25(H) 18  Creatinine 0.44 - 1.00 mg/dL 0.93 0.89 0.95  Sodium 135 - 145 mmol/L 138 138 141  Potassium 3.5 - 5.1 mmol/L 3.7 3.7 3.4(L)  Chloride 98 - 111 mmol/L 105 101 107  CO2 22 - 32 mmol/L 26 28 24   Calcium 8.9 - 10.3 mg/dL 9.2 8.8(L) 8.5(L)  Total Protein 6.5 - 8.1 g/dL 6.1(L) 5.9(L) 6.2(L)  Total Bilirubin 0.3 - 1.2 mg/dL 0.5 0.5 0.5  Alkaline Phos 38 - 126 U/L 75 71 70  AST 15 - 41 U/L 16 10(L) 14(L)  ALT 0 - 44 U/L 7 9 <6    No results for input(s): LABCA2 in the last 72 hours.   STUDIES: No results found.   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             (b) rituximab added to ibrutinib, first dose 08/27/2018             (c) hepatitis B studies 08/27/2018 negative             (f) ibrutinib/rituximab discontinued after 10/02/2018 dose w/o obvious response  (6) bendamustine/rituximab started 10/22/2018, discontinued after 1 cycle with rapid progression  (7) obinutuzumab/Gaziva started 11/11/2018  (a) second dose given 11/19/2018 together with chemotherapy  (b) third dose given 12/26/2018 and subsequent doses with chemotherapy  (8) CH[O]P chemotherapy started 11/19/2018             (a) echocardiogram on 11/07/2018 shows EF of 55-60%  (b) second cycle of CH[O]P postponed because of pandemic, given 12/26/2018, greatly reduced doses  (c) cycle 3 of CH[O]P/obinutuzumab given 02/03/2019 at increased doses  (d) cycle 4 and 5 of CH[O]P given on 6/16 and 7/7 at increased doses of 35mg /m2 and 550mg /m2  (9) severe immunocompromise: Maria [name]  (a) received IVIG 12/02/2018, repeated 12/17/2018 and 02/13/2019 (given every 8 weeks at this point)   PLAN:  Maria [name] has had a good partial response to her chemotherapy and immunotherapy so far.  I am concerned that we do not have a lot more room for doxorubicin.  I am going to obtain an echocardiogram.  I expected to be fine.  If so then she will receive her last 2 cycles of chemo on 04/28/2019 and 05/19/2019.  She will then return on 06/11/2019 for her next dose of IVIG.  On that day, October 1, we will start venetoclax.  We discussed that in a preliminary fashion today.  She has a good understanding of the possible toxicities side effects and complications of this agent.  Because she has had the prior chemo I do not think she will need to be admitted for close observation of tumor lysis.  Instead we will do the usual ramp-up as an outpatient  I reassured her that we are not giving her anything for her bones and  if Dr. Manuella Ghazi believes she is due for her zoledronate then we have absolutely no problems with that.  She may receive the flu vaccine when it becomes available.  Unfortunately it is unlikely to produce a good response in her given her immunosuppressive therapy  At this point I am encouraged that we are making progress.  She knows to call for any Maria issues that may develop before the next visit.  Maria [name]. , [name]  04/16/19 10:12 AM Medical Oncology and Hematology [facility] [address] [place], [place] [address] Tel. [phone]    Fax. [phone]   I, Wilburn Mylar, am acting as scribe for Dr. [name] [name]. .  I, Lurline Del [name], have reviewed the above documentation for accuracy and completeness, and I agree with the above.

## 2019-04-16 ENCOUNTER — Inpatient Hospital Stay: Payer: Medicare Other

## 2019-04-16 ENCOUNTER — Telehealth: Payer: Self-pay | Admitting: Oncology

## 2019-04-16 ENCOUNTER — Inpatient Hospital Stay: Payer: Medicare Other | Attending: Oncology

## 2019-04-16 ENCOUNTER — Inpatient Hospital Stay (HOSPITAL_BASED_OUTPATIENT_CLINIC_OR_DEPARTMENT_OTHER): Payer: Medicare Other | Admitting: Oncology

## 2019-04-16 ENCOUNTER — Other Ambulatory Visit: Payer: Self-pay

## 2019-04-16 VITALS — BP 116/41 | HR 86 | Temp 97.8°F | Resp 18 | Ht 62.0 in | Wt 174.0 lb

## 2019-04-16 VITALS — BP 108/58 | HR 68 | Temp 98.3°F | Resp 16

## 2019-04-16 DIAGNOSIS — D801 Nonfamilial hypogammaglobulinemia: Secondary | ICD-10-CM | POA: Insufficient documentation

## 2019-04-16 DIAGNOSIS — Z95828 Presence of other vascular implants and grafts: Secondary | ICD-10-CM

## 2019-04-16 DIAGNOSIS — C9110 Chronic lymphocytic leukemia of B-cell type not having achieved remission: Secondary | ICD-10-CM

## 2019-04-16 DIAGNOSIS — Z5112 Encounter for antineoplastic immunotherapy: Secondary | ICD-10-CM | POA: Diagnosis not present

## 2019-04-16 DIAGNOSIS — I82412 Acute embolism and thrombosis of left femoral vein: Secondary | ICD-10-CM | POA: Diagnosis not present

## 2019-04-16 DIAGNOSIS — Z5111 Encounter for antineoplastic chemotherapy: Secondary | ICD-10-CM | POA: Insufficient documentation

## 2019-04-16 LAB — CBC WITH DIFFERENTIAL/PLATELET
Abs Immature Granulocytes: 0.03 K/uL (ref 0.00–0.07)
Basophils Absolute: 0 K/uL (ref 0.0–0.1)
Basophils Relative: 2 %
Eosinophils Absolute: 0 K/uL (ref 0.0–0.5)
Eosinophils Relative: 1 %
HCT: 31.6 % — ABNORMAL LOW (ref 36.0–46.0)
Hemoglobin: 10.4 g/dL — ABNORMAL LOW (ref 12.0–15.0)
Immature Granulocytes: 1 %
Lymphocytes Relative: 30 %
Lymphs Abs: 0.7 K/uL (ref 0.7–4.0)
MCH: 31.1 pg (ref 26.0–34.0)
MCHC: 32.9 g/dL (ref 30.0–36.0)
MCV: 94.6 fL (ref 80.0–100.0)
Monocytes Absolute: 0.2 K/uL (ref 0.1–1.0)
Monocytes Relative: 9 %
Neutro Abs: 1.3 K/uL — ABNORMAL LOW (ref 1.7–7.7)
Neutrophils Relative %: 57 %
Platelets: 63 K/uL — ABNORMAL LOW (ref 150–400)
RBC: 3.34 MIL/uL — ABNORMAL LOW (ref 3.87–5.11)
RDW: 14.4 % (ref 11.5–15.5)
WBC: 2.3 K/uL — ABNORMAL LOW (ref 4.0–10.5)
nRBC: 0 % (ref 0.0–0.2)

## 2019-04-16 LAB — COMPREHENSIVE METABOLIC PANEL
ALT: 16 U/L (ref 0–44)
AST: 16 U/L (ref 15–41)
Albumin: 3.8 g/dL (ref 3.5–5.0)
Alkaline Phosphatase: 80 U/L (ref 38–126)
Anion gap: 11 (ref 5–15)
BUN: 18 mg/dL (ref 8–23)
CO2: 26 mmol/L (ref 22–32)
Calcium: 9.3 mg/dL (ref 8.9–10.3)
Chloride: 103 mmol/L (ref 98–111)
Creatinine, Ser: 1.01 mg/dL — ABNORMAL HIGH (ref 0.44–1.00)
GFR calc Af Amer: >60 mL/min (ref >60)
GFR calc non Af Amer: 57 mL/min — ABNORMAL LOW (ref >60)
Glucose, Bld: 195 mg/dL — ABNORMAL HIGH (ref 70–99)
Potassium: 3.7 mmol/L (ref 3.5–5.1)
Sodium: 140 mmol/L (ref 135–145)
Total Bilirubin: 0.6 mg/dL (ref 0.3–1.2)
Total Protein: 5.9 g/dL — ABNORMAL LOW (ref 6.5–8.1)

## 2019-04-16 LAB — LACTATE DEHYDROGENASE: LDH: 189 U/L (ref 98–192)

## 2019-04-16 MED ORDER — DEXTROSE 5 % IV SOLN
INTRAVENOUS | Status: DC
  Administered 2019-04-16: 11:00:00 via INTRAVENOUS
  Filled 2019-04-16: qty 250

## 2019-04-16 MED ORDER — IMMUNE GLOBULIN (HUMAN) 20 GM/200ML IV SOLN
1.0000 g/kg | Freq: Once | INTRAVENOUS | Status: AC
  Administered 2019-04-16: 80 g via INTRAVENOUS
  Filled 2019-04-16: qty 800

## 2019-04-16 MED ORDER — ACETAMINOPHEN 325 MG PO TABS
650.0000 mg | ORAL_TABLET | Freq: Once | ORAL | Status: AC
  Administered 2019-04-16: 650 mg via ORAL

## 2019-04-16 MED ORDER — ACETAMINOPHEN 325 MG PO TABS
ORAL_TABLET | ORAL | Status: AC
  Filled 2019-04-16: qty 2

## 2019-04-16 MED ORDER — DIPHENHYDRAMINE HCL 25 MG PO CAPS
25.0000 mg | ORAL_CAPSULE | Freq: Once | ORAL | Status: AC
  Administered 2019-04-16: 25 mg via ORAL

## 2019-04-16 MED ORDER — HEPARIN SOD (PORK) LOCK FLUSH 100 UNIT/ML IV SOLN
500.0000 [IU] | Freq: Once | INTRAVENOUS | Status: AC
  Administered 2019-04-16: 500 [IU]
  Filled 2019-04-16: qty 5

## 2019-04-16 MED ORDER — SODIUM CHLORIDE 0.9% FLUSH
10.0000 mL | Freq: Once | INTRAVENOUS | Status: AC
  Administered 2019-04-16: 10 mL
  Filled 2019-04-16: qty 10

## 2019-04-16 MED ORDER — DIPHENHYDRAMINE HCL 25 MG PO CAPS
ORAL_CAPSULE | ORAL | Status: AC
  Filled 2019-04-16: qty 1

## 2019-04-16 NOTE — Telephone Encounter (Signed)
I could not leave a message regarding additional appointments added to schedule. Mailbox was full

## 2019-04-16 NOTE — Progress Notes (Signed)
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 Kennith Center, Pharm.D., CPP 04/16/2019@11 :00 AM

## 2019-04-16 NOTE — Patient Instructions (Signed)
Immune Globulin Injection What is this medicine? IMMUNE GLOBULIN (im MUNE GLOB yoo lin) helps to prevent or reduce the severity of certain infections in patients who are at risk. This medicine is collected from the pooled blood of many donors. It is used to treat immune system problems, thrombocytopenia, and Kawasaki syndrome. This medicine may be used for other purposes; ask your health care provider or pharmacist if you have questions. COMMON BRAND NAME(S): ASCENIV, Baygam, BIVIGAM, Carimune, Carimune NF, cutaquig, Cuvitru, Flebogamma, Flebogamma DIF, GamaSTAN, GamaSTAN S/D, Gamimune N, Gammagard, Gammagard S/D, Gammaked, Gammaplex, Gammar-P IV, Gamunex, Gamunex-C, Hizentra, Iveegam, Iveegam EN, Octagam, Panglobulin, Panglobulin NF, panzyga, Polygam S/D, Privigen, Sandoglobulin, Venoglobulin-S, Vigam, Vivaglobulin, Xembify What should I tell my health care provider before I take this medicine? They need to know if you have any of these conditions:   diabetes   extremely low or no immune antibodies in the blood   heart disease   history of blood clots   hyperprolinemia   infection in the blood, sepsis   kidney disease   taking medicine that may change kidney function - ask your health care provider about your medicine   an unusual or allergic reaction to human immune globulin, albumin, maltose, sucrose, polysorbate 80, other medicines, foods, dyes, or preservatives   pregnant or trying to get pregnant   breast-feeding How should I use this medicine? This medicine is for injection into a muscle or infusion into a vein or skin. It is usually given by a health care professional in a hospital or clinic setting. In rare cases, some brands of this medicine might be given at home. You will be taught how to give this medicine. Use exactly as directed. Take your medicine at regular intervals. Do not take your medicine more often than directed. Talk to your pediatrician regarding  the use of this medicine in children. Special care may be needed. Overdosage: If you think you have taken too much of this medicine contact a poison control center or emergency room at once. NOTE: This medicine is only for you. Do not share this medicine with others. What if I miss a dose? It is important not to miss your dose. Call your doctor or health care professional if you are unable to keep an appointment. If you give yourself the medicine and you miss a dose, take it as soon as you can. If it is almost time for your next dose, take only that dose. Do not take double or extra doses. What may interact with this medicine?  aspirin and aspirin-like medicines  cisplatin  cyclosporine  medicines for infection like acyclovir, adefovir, amphotericin B, bacitracin, cidofovir, foscarnet, ganciclovir, gentamicin, pentamidine, vancomycin  NSAIDS, medicines for pain and inflammation, like ibuprofen or naproxen  pamidronate  vaccines  zoledronic acid This list may not describe all possible interactions. Give your health care provider a list of all the medicines, herbs, non-prescription drugs, or dietary supplements you use. Also tell them if you smoke, drink alcohol, or use illegal drugs. Some items may interact with your medicine. What should I watch for while using this medicine? Your condition will be monitored carefully while you are receiving this medicine. This medicine is made from pooled blood donations of many different people. It may be possible to pass an infection in this medicine. However, the donors are screened for infections and all products are tested for HIV and hepatitis. The medicine is treated to kill most or all bacteria and viruses. Talk to your doctor about   the risks and benefits of this medicine. Do not have vaccinations for at least 14 days before, or until at least 3 months after receiving this medicine. What side effects may I notice from receiving this  medicine? Side effects that you should report to your doctor or health care professional as soon as possible:  allergic reactions like skin rash, itching or hives, swelling of the face, lips, or tongue  breathing problems  chest pain or tightness  fever, chills  headache with nausea, vomiting  neck pain or difficulty moving neck  pain when moving eyes  pain, swelling, warmth in the leg  problems with balance, talking, walking  sudden weight gain  swelling of the ankles, feet, hands  trouble passing urine or change in the amount of urine Side effects that usually do not require medical attention (report to your doctor or health care professional if they continue or are bothersome):  dizzy, drowsy  flushing  increased sweating  leg cramps  muscle aches and pains  pain at site where injected This list may not describe all possible side effects. Call your doctor for medical advice about side effects. You may report side effects to FDA at 1-800-FDA-1088. Where should I keep my medicine? Keep out of the reach of children. This drug is usually given in a hospital or clinic and will not be stored at home. In rare cases, some brands of this medicine may be given at home. If you are using this medicine at home, you will be instructed on how to store this medicine. Throw away any unused medicine after the expiration date on the label. NOTE: This sheet is a summary. It may not cover all possible information. If you have questions about this medicine, talk to your doctor, pharmacist, or health care provider.  2020 Elsevier/Gold Standard (2008-11-17 11:44:49)  

## 2019-04-17 LAB — IGG, IGA, IGM
IgA: 20 mg/dL — ABNORMAL LOW (ref 87–352)
IgG (Immunoglobin G), Serum: 486 mg/dL — ABNORMAL LOW (ref 586–1602)
IgM (Immunoglobulin M), Srm: <5 mg/dL — ABNORMAL LOW (ref 26–217)

## 2019-04-21 ENCOUNTER — Telehealth: Payer: Self-pay | Admitting: Pharmacist

## 2019-04-21 NOTE — Telephone Encounter (Signed)
Oral Oncology Pharmacist Encounter  Received new referral for Venclexta (venetoclax) for the treatment of chronic lymphocytic leukemia/small cell lymphocytic leukemia, planned duration until disease progression or unacceptable toxicity. Planned start date 06/11/2019  Venclexta is planned to be dosed on up-titration schedule for CLL Week 1: 20mg  by mouth once daily with food and water Week 2: 50mg  by mouth once daily with food and water Week 3: 100mg  by mouth once daily with food and water Week 4: 200mg  by mouth once daily with food and water Week 5: 400mg  by mouth once daily with food and water  400mg  once daily is target dose of Venclexta for CLL  Patient may be at a medium tumor burden and at moderate risk for tumor lysis syndrome due to SCr=1.01 and est CrCl ~ 60 mL/min. There is no recent scan to assess lymph node size, but I would expect them to be < 5cm due to current IV chemotherapy administration.  Noted mild neutropenia and moderate thrombocytopenia on lab check 04/16/2019.  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  Current medication list in Epic reviewed, no DDIs with venetoclax identified. Noted allopurinol is already on patient's medication list. No antinausea agent noted, we will ensure patient has an active prescription prior to venetoclax initiation  Prescription will be e-scribed to the [facility] for benefits analysis and approval once received from MD closer to planned start date. We will submit for insurance authorization.  Oral Oncology Clinic will continue to follow for insurance authorization, copayment issues, initial counseling and start date.  Johny Drilling, PharmD, BCPS, BCOP  04/21/2019 8:28 AM Oral  Oncology Clinic [phone]

## 2019-04-22 ENCOUNTER — Ambulatory Visit (HOSPITAL_COMMUNITY): Payer: Medicare Other | Attending: Cardiovascular Disease

## 2019-04-22 ENCOUNTER — Other Ambulatory Visit: Payer: Self-pay

## 2019-04-22 ENCOUNTER — Telehealth: Payer: Self-pay | Admitting: Oncology

## 2019-04-22 DIAGNOSIS — I10 Essential (primary) hypertension: Secondary | ICD-10-CM | POA: Insufficient documentation

## 2019-04-22 DIAGNOSIS — I080 Rheumatic disorders of both mitral and aortic valves: Secondary | ICD-10-CM | POA: Insufficient documentation

## 2019-04-22 DIAGNOSIS — E119 Type 2 diabetes mellitus without complications: Secondary | ICD-10-CM | POA: Diagnosis not present

## 2019-04-22 DIAGNOSIS — E785 Hyperlipidemia, unspecified: Secondary | ICD-10-CM | POA: Diagnosis not present

## 2019-04-22 DIAGNOSIS — C9110 Chronic lymphocytic leukemia of B-cell type not having achieved remission: Secondary | ICD-10-CM | POA: Diagnosis present

## 2019-04-22 NOTE — Telephone Encounter (Signed)
I could not reach patient regarding additional appointments from 10/1 -10/30

## 2019-04-23 ENCOUNTER — Other Ambulatory Visit: Payer: Self-pay | Admitting: Oncology

## 2019-04-23 ENCOUNTER — Ambulatory Visit (HOSPITAL_COMMUNITY)
Admission: RE | Admit: 2019-04-23 | Discharge: 2019-04-23 | Disposition: A | Discharge status: Home / Self Care | Payer: Medicare Other | Source: Ambulatory Visit | Attending: Oncology | Admitting: Oncology

## 2019-04-23 ENCOUNTER — Inpatient Hospital Stay (HOSPITAL_BASED_OUTPATIENT_CLINIC_OR_DEPARTMENT_OTHER): Payer: Medicare Other | Admitting: Oncology

## 2019-04-23 ENCOUNTER — Other Ambulatory Visit: Payer: Self-pay

## 2019-04-23 ENCOUNTER — Telehealth: Payer: Self-pay

## 2019-04-23 VITALS — HR 92

## 2019-04-23 DIAGNOSIS — Z7189 Other specified counseling: Secondary | ICD-10-CM

## 2019-04-23 DIAGNOSIS — M7989 Other specified soft tissue disorders: Secondary | ICD-10-CM | POA: Diagnosis present

## 2019-04-23 DIAGNOSIS — D801 Nonfamilial hypogammaglobulinemia: Secondary | ICD-10-CM

## 2019-04-23 DIAGNOSIS — I824Y2 Acute embolism and thrombosis of unspecified deep veins of left proximal lower extremity: Secondary | ICD-10-CM | POA: Diagnosis not present

## 2019-04-23 DIAGNOSIS — I82402 Acute embolism and thrombosis of unspecified deep veins of left lower extremity: Secondary | ICD-10-CM | POA: Diagnosis not present

## 2019-04-23 DIAGNOSIS — Z5112 Encounter for antineoplastic immunotherapy: Secondary | ICD-10-CM | POA: Diagnosis not present

## 2019-04-23 MED ORDER — ENOXAPARIN SODIUM 120 MG/0.8ML ~~LOC~~ SOLN: 120.0000 mg | Freq: Once | SUBCUTANEOUS | Status: DC

## 2019-04-23 MED ORDER — ENOXAPARIN SODIUM 150 MG/ML ~~LOC~~ SOLN: 150.0000 mg | Freq: Once | SUBCUTANEOUS | Status: DC

## 2019-04-23 MED ORDER — ENOXAPARIN SODIUM 150 MG/ML ~~LOC~~ SOLN: 120.0000 mg | SUBCUTANEOUS | 3 refills | Status: DC

## 2019-04-23 MED ORDER — ENOXAPARIN SODIUM 150 MG/ML ~~LOC~~ SOLN
120.0000 mg | Freq: Once | SUBCUTANEOUS | Status: AC
  Administered 2019-04-23: 120 mg via SUBCUTANEOUS
  Filled 2019-04-23: qty 0.8

## 2019-04-23 NOTE — Progress Notes (Signed)
[place]  [phone] [phone]    ID: Maria [name]   DOB: 1951-05-08  MR#: [id number]  BTD#:[id number]  Patient Care Team: Marton Redwood, MD as PCP - General (Internal Medicine) [name], MD as Consulting Physician (Orthopedic Surgery) Francess Mullen, [name] Dad, MD as Consulting Physician (Oncology) [name], MD as Consulting Physician (General Surgery) Causey, [name], NP as Nurse Practitioner (Hematology and Oncology) OTHER MD:   CHIEF COMPLAINT: Small lymphocytic lymphoma  CURRENT TREATMENT: CHOP/obinutuzumab, IVIG   INTERVAL HISTORY: Maria [name] called to report that she was having left lower extremity swelling.  She feels this started shortly after her most recent treatment.  We set her up for Doppler ultrasonography and unfortunately this did document a left lower extremity DVT.  She came to the clinic directly from the Doppler suite.   REVIEW OF SYSTEMS: Maria [name] denies any cough, shortness of breath, pleurisy, or hemoptysis.  She denies any intercurrent fever, and feels her adenopathy continues to shrink.  Aside from the left lower extremity swelling she reports no new symptoms.  A detailed review of systems today was otherwise stable  HISTORY OF PRESENT ILLNESS: From the original intake note:  Maria [name] developed what she thought was "the flu" in December of 2002. She noted a large lymph node developing in her right anterior cervical area. 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   Her subsequent history is as detailed above.   PAST MEDICAL HISTORY: Past Medical History:  Diagnosis Date   Allergy    Arthritis    Asthma    Cancer ([place])    chronic lymphacytic leukemia   CLL (chronic lymphocytic leukemia) ([place])    Diabetes mellitus    PMH   Headache    migraine   Hyperlipidemia    Hypertension     Hypothyroidism    Lymphoma ([place])    Thyroid disease    hypothyroidism   Wears glasses     PAST SURGICAL HISTORY: Past Surgical History:  Procedure Laterality Date   ABDOMINAL HYSTERECTOMY     AXILLARY LYMPH NODE BIOPSY Right 08/21/2018   Procedure: RIGHT AXILLARY LYMPH NODE BIOPSY ERAS PATHWAY;  Surgeon: [name], MD;  Location: [facility];  Service: General;  Laterality: Right;  LMA   BREAST EXCISIONAL BIOPSY Right    COLONOSCOPY     IR IMAGING GUIDED PORT INSERTION  11/10/2018   LYMPH NODE DISSECTION     STRABISMUS SURGERY     TONSILLECTOMY     TOTAL KNEE ARTHROPLASTY Right 12/03/2016   Procedure: RIGHT TOTAL KNEE ARTHROPLASTY;  Surgeon: [name], MD;  Location: [facility];  Service: Orthopedics;  Laterality: Right;    FAMILY HISTORY: Family History  Problem Relation Age of Onset   Liver cancer Mother    Heart disease Father    Breast cancer Sister 71   Colon cancer Neg Hx    Pancreatic cancer Neg Hx    Rectal cancer Neg Hx    Stomach cancer Neg Hx    The patient's father died at the age of 64 from a myocardial infarction. The patient's mother died at the age of 50 from primary liver carcinoma. The patient had no brothers. Her one sister, Maria [name], has a history of breast cancer   GYNECOLOGIC HISTORY: Menarche at around age 35. The patient is GX P0. 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 She lives by herself. She had two cats, Maria [name] and Maria [name], but they are currently with her sister and Maria [name] thinks they would be more at home there. She attends a [employer], operational.               ADVANCED DIRECTIVES: Completed 02/03/2019. She has named her niece her 43.   HEALTH MAINTENANCE: Social History   Socioeconomic History   Marital status: Single    Spouse  name: Not on file   Number of children: Not on file   Years of education: Not on file   Highest education level: Not on file  Occupational History   Not on file  Social Needs   Financial resource strain: Not hard at all   Food insecurity    Worry: Never true    Inability: Never true   Transportation needs    Medical: No    Non-medical: No  Tobacco Use   Smoking status: Never Smoker   Smokeless tobacco: Never Used  Substance and Sexual Activity   Alcohol use: Yes    Comment: occasional alcohol intake; once or twice monthly   Drug use: No   Sexual activity: Not on file  Lifestyle   Physical activity    Days per week: 0 days    Minutes per session: 0 min   Stress: Not at all  Relationships   Social connections    Talks on phone: Once a week    Gets together: Once a week    Attends religious service: Never    Active member of club or organization: No    Attends meetings of clubs or organizations: Never    Relationship status: Never married   Intimate partner violence    Fear of current or ex partner: No    Emotionally abused: No    Physically abused: No    Forced sexual activity: No  Other Topics Concern   Not on file  Social History Narrative   Not on file                Colonoscopy: 06/01/2014/[facility]             PAP:             Bone density:             Lipid panel:  228/114/50/155/ratio 4.6  On 10/01/2016  ALLERGIES: Allergies  Allergen Reactions   Iohexol Swelling and Rash   Compazine [Prochlorperazine Edisylate] Other (See Comments)    Mild confusion   Contrast Media  [Iodinated Diagnostic Agents]    Lactose Intolerance (Gi)     Diarrhea, gas bloating   Cephalexin Rash    CURRENT MEDICATIONS: Current Outpatient Medications  Medication Sig Dispense Refill   acetaminophen (TYLENOL 8 HOUR ARTHRITIS PAIN) 650 MG CR tablet Take 650 mg by mouth every 8 (eight) hours as needed for pain.     acyclovir (ZOVIRAX) 200 MG capsule TAKE  1 CAPSULE (200 MG TOTAL) BY MOUTH 3 (THREE) TIMES DAILY. 270 capsule 1   allopurinol (ZYLOPRIM) 300 MG tablet Take 1 tablet (300 mg total) by mouth daily. 90 tablet 4   ciprofloxacin (CIPRO) 500 MG tablet Take 1 tablet (500 mg total) by mouth 2 (two) times daily. 40 tablet 0   doxycycline (VIBRA-TABS) 100 MG tablet Take 1 tablet (100 mg total) by mouth daily. 60 tablet 2   enoxaparin (  LOVENOX) 150 MG/ML injection Inject 0.8 mLs (120 mg total) into the skin daily. 30 mL 3   furosemide (LASIX) 20 MG tablet TAKE 1 TABLET BY MOUTH EVERY DAY 90 tablet 1   hydrochlorothiazide (HYDRODIURIL) 12.5 MG tablet Take 12.5 mg by mouth daily.     hydrocortisone (ANUSOL-HC) 2.5 % rectal cream Place rectally 3 (three) times daily. 30 g 0   KLOR-CON M20 20 MEQ tablet TAKE 1 TABLET BY MOUTH EVERY DAY 30 tablet 0   lidocaine-prilocaine (EMLA) cream Apply to affected area once 30 g 3   predniSONE (DELTASONE) 20 MG tablet Take 3 tablets (60 mg total) by mouth daily with breakfast. Take days 1-5 starting on CHOP chemo day 15 tablet 6   rosuvastatin (CRESTOR) 10 MG tablet Take 10 mg by mouth at bedtime.  11   SYNTHROID 88 MCG tablet Take 88 mcg by mouth daily before breakfast.      No current facility-administered medications for this visit.     OBJECTIVE: Middle-aged white woman in no acute distress Vitals:   04/23/19 1614 04/23/19 1618  [name]: 78 92  SpO2: 100% 100%    ECOG FS: 1  Lungs are clear to auscultation and percussion Left lower extremity shows grade 1 lymphedema, no erythema  LAB RESULTS: Lab Results  Component Value Date   WBC 2.3 (L) 04/16/2019   NEUTROABS 1.3 (L) 04/16/2019   HGB 10.4 (L) 04/16/2019   HCT 31.6 (L) 04/16/2019   MCV 94.6 04/16/2019   PLT 63 (L) 04/16/2019   CMP Latest Ref Rng & Units 04/16/2019 04/07/2019 03/24/2019  Glucose 70 - 99 mg/dL 195(H) 122(H) 145(H)  BUN 8 - 23 mg/dL 18 22 25(H)  Creatinine 0.44 - 1.00 mg/dL 1.01(H) 0.93 0.89  Sodium 135 - 145  mmol/L 140 138 138  Potassium 3.5 - 5.1 mmol/L 3.7 3.7 3.7  Chloride 98 - 111 mmol/L 103 105 101  CO2 22 - 32 mmol/L 26 26 28   Calcium 8.9 - 10.3 mg/dL 9.3 9.2 8.8(L)  Total Protein 6.5 - 8.1 g/dL 5.9(L) 6.1(L) 5.9(L)  Total Bilirubin 0.3 - 1.2 mg/dL 0.6 0.5 0.5  Alkaline Phos 38 - 126 U/L 80 75 71  AST 15 - 41 U/L 16 16 10(L)  ALT 0 - 44 U/L 16 7 9     No results for input(s): LABCA2 in the last 72 hours.   STUDIES: Vas [place] Lower Extremity Venous (dvt)  Result Date: 04/23/2019  Lower Venous Study Indications: Pain, and Swelling.  Risk Factors: Cancer Small lymphocytic lymphoma. Comparison Study: No comparison except for the right which was negative 09/11/2018                   including the lt cfv and sfj Performing Technologist: Toma Copier RVS  Examination Guidelines: A complete evaluation includes B-mode imaging, spectral Doppler, color Doppler, and power Doppler as needed of all accessible portions of each vessel. Bilateral testing is considered an integral part of a complete examination. Limited examinations for reoccurring indications may be performed as noted.  +-----+---------------+---------+-----------+----------+-------+  RIGHT Compressibility Phasicity Spontaneity Properties Summary  +-----+---------------+---------+-----------+----------+-------+  CFV   Full            Yes       Yes                             +-----+---------------+---------+-----------+----------+-------+  SFJ   Full                                                      +-----+---------------+---------+-----------+----------+-------+   +---------+---------------+---------+-----------+----------+-------------------+  LEFT      Compressibility Phasicity Spontaneity Properties Summary              +---------+---------------+---------+-----------+----------+-------------------+  CFV       None            No        No                     Acute                 +---------+---------------+---------+-----------+----------+-------------------+  SFJ       None                                                                  +---------+---------------+---------+-----------+----------+-------------------+  FV Prox   None            No        No                     Acute                +---------+---------------+---------+-----------+----------+-------------------+  FV Mid    None            No        No                     Acute                +---------+---------------+---------+-----------+----------+-------------------+  FV Distal Partial         No        Yes                    Acute                +---------+---------------+---------+-----------+----------+-------------------+  PFV       None            No        No                     Acute                +---------+---------------+---------+-----------+----------+-------------------+  POP       None            No        No                                          +---------+---------------+---------+-----------+----------+-------------------+  PTV       Partial                                          acute in one of the  paired vein flow                                                                 noted on                                                                         augmentation at the                                                              ankle unable to                                                                  visualize well due                                                               to edema             +---------+---------------+---------+-----------+----------+-------------------+  PERO                                                       Not visualized                                                                   except on                                                                        augmentation to  fully evaluate       +---------+---------------+---------+-----------+----------+-------------------+  Gastroc   Partial                                                               +---------+---------------+---------+-----------+----------+-------------------+   Left Technical Findings: Not visualized segments include Peroneal and Posterior tibial veins. Technically difficult to visualize the PTV in its' entirety nor the peroneal vein due to edema and tighness of the lower leg   Summary: Right: There is no evidence of a common femoral vein obstruction Left: Findings consistent with acute deep vein thrombosis involving the left common femoral vein, left femoral vein, left proximal profunda vein, left popliteal vein, and left posterior tibial veins. Findings consistent with acute superficial vein thrombosis involving the left great saphenous vein. No cystic structure found in the popliteal fossa. See technical findings listed above Unable to evaluate extension of common femoral vein obstruction proximal to the inguinal ligament.  *See table(s) above for measurements and observations.    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             (b) rituximab added to ibrutinib, first dose 08/27/2018             (c) hepatitis B studies 08/27/2018 negative             (f) ibrutinib/rituximab discontinued after 10/02/2018 dose w/o obvious response  (6) bendamustine/rituximab started 10/22/2018, discontinued after 1 cycle with rapid progression  (7) obinutuzumab/Gaziva started 11/11/2018  (a) second dose given 11/19/2018 together with chemotherapy  (b) third dose given 12/26/2018 and subsequent doses with chemotherapy  (8) CH[O]P chemotherapy started 11/19/2018             (a) echocardiogram on 11/07/2018 shows EF of 55-60%  (b) second cycle of CH[O]P postponed because of pandemic, given 12/26/2018, greatly reduced doses  (c) cycle 3 of CH[O]P/obinutuzumab given 02/03/2019 at increased doses  (d) cycle 4 and 5 of CH[O]P given on 6/16 and 7/7 at increased doses of 35mg /m2 and 550mg /m2  (9) severe immunocompromise: Marked hypogammaglobulinemia  (a) received IVIG 12/02/2018, repeated 12/17/2018 and 02/13/2019 (given every 8 weeks at this point)  (10) 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  (a) LMWH started 04/23/2019   PLAN:  Alicyn has  developed a left lower extremity DVT.  We discussed the possible causes including cancer, cancer treatment, and immobility.  She will be on Lovenox at 120 mg subcu daily for the first 3 months after which we will consider switching to rivaroxaban.  She will be treated a minimum of 6 months .  She received her first dose today.  She knows how to self administer the Lovenox, being a nurse, and we called her pharmacy to make sure it would be available to her.  We also contacted her insurance  Clinically there is no evidence of pulmonary embolus, with no  symptoms or signs and excellent saturations at rest and with activity  She understands her platelet count is low, although it is greater than 50,000.  If she develops any petechiae bruising or bleeding she will immediately call  She is already scheduled to return 04/28/2019.  We will reassess at that point.    Medical Oncology and Hematology [facility] [address] [place], [place] [address] Tel. [phone]    Fax. [phone]   I, Wilburn Mylar, am acting as scribe for Dr. [name] Dad. Bryssa Tones.  I, Lurline Del MD, have reviewed the above documentation for accuracy and completeness, and I agree with the above.

## 2019-04-23 NOTE — Progress Notes (Signed)
Left lower extremity venous duplex completed. Preliminary results in Chart review CV proc. [place] Arvon Schreiner,RVS 04/23/2019,3:58 PM

## 2019-04-23 NOTE — Telephone Encounter (Signed)
RN returned patient's call.  Pt c/o left leg swelling.  Pt reports swelling started after Privigen treatment on 8/6.  Pt denies any warmth to LLE or pain.  Pt has been elevating leg with no relief.    Per MD recommendations obtain venous doppler to rule out DVT.  Pt scheduled for 8/13 @ 3pm at [facility].  Pt aware and voiced understanding.

## 2019-04-23 NOTE — Telephone Encounter (Signed)
Oral Oncology Patient Advocate Encounter  Prior Authorization for Lynita Lombard has been approved.    PA# [id number] Effective dates: 04/23/19 through 09/10/19  Oral Oncology Clinic will continue to follow.   [facility] Patient [place] Phone [phone] Fax [phone] 04/23/2019    2:49 PM

## 2019-04-23 NOTE — Telephone Encounter (Signed)
Oral Oncology Patient Advocate Encounter  Received notification from [employer] that prior authorization for Venclexta is required.  PA submitted on CoverMyMeds Key [id number] Status is pending  Oral Oncology Clinic will continue to follow.  [facility] Patient [place] Phone [phone] Fax [phone] 04/23/2019    2:47 PM

## 2019-04-23 NOTE — Progress Notes (Signed)
Called  [employer] s/w Sophia reference for my call is [phone]. No authorization required per patient insurance.Lovenox

## 2019-04-23 NOTE — Progress Notes (Signed)
Dr. Jana Hakim aware of platelet count.  OK to proceed with Lovenox injection.

## 2019-04-23 NOTE — Progress Notes (Signed)
MD aware plt=63.  OK to give Lovenox

## 2019-04-26 ENCOUNTER — Other Ambulatory Visit: Payer: Self-pay | Admitting: Oncology

## 2019-04-28 ENCOUNTER — Encounter: Payer: Self-pay | Admitting: Adult Health

## 2019-04-28 ENCOUNTER — Inpatient Hospital Stay: Payer: Medicare Other

## 2019-04-28 ENCOUNTER — Other Ambulatory Visit: Payer: Self-pay

## 2019-04-28 ENCOUNTER — Encounter: Payer: Self-pay | Admitting: *Deleted

## 2019-04-28 ENCOUNTER — Inpatient Hospital Stay: Payer: Medicare Other | Admitting: Adult Health

## 2019-04-28 ENCOUNTER — Encounter: Payer: Self-pay | Admitting: Oncology

## 2019-04-28 VITALS — BP 126/62 | HR 59 | Temp 98.0°F | Resp 17

## 2019-04-28 VITALS — BP 138/53 | HR 72 | Temp 97.8°F | Resp 18 | Ht 62.0 in | Wt 185.5 lb

## 2019-04-28 DIAGNOSIS — C9110 Chronic lymphocytic leukemia of B-cell type not having achieved remission: Secondary | ICD-10-CM | POA: Diagnosis not present

## 2019-04-28 DIAGNOSIS — Z95828 Presence of other vascular implants and grafts: Secondary | ICD-10-CM

## 2019-04-28 DIAGNOSIS — D801 Nonfamilial hypogammaglobulinemia: Secondary | ICD-10-CM

## 2019-04-28 DIAGNOSIS — Z5112 Encounter for antineoplastic immunotherapy: Secondary | ICD-10-CM | POA: Diagnosis not present

## 2019-04-28 LAB — CBC WITH DIFFERENTIAL/PLATELET
Abs Immature Granulocytes: 0 10*3/uL (ref 0.00–0.07)
Basophils Absolute: 0 10*3/uL (ref 0.0–0.1)
Basophils Relative: 0 %
Eosinophils Absolute: 0 10*3/uL (ref 0.0–0.5)
Eosinophils Relative: 0 %
HCT: 33.1 % — ABNORMAL LOW (ref 36.0–46.0)
Hemoglobin: 10.6 g/dL — ABNORMAL LOW (ref 12.0–15.0)
Lymphocytes Relative: 43 %
Lymphs Abs: 2.2 10*3/uL (ref 0.7–4.0)
MCH: 31.2 pg (ref 26.0–34.0)
MCHC: 32 g/dL (ref 30.0–36.0)
MCV: 97.4 fL (ref 80.0–100.0)
Monocytes Absolute: 0.1 10*3/uL (ref 0.1–1.0)
Monocytes Relative: 1 %
Neutro Abs: 2.8 10*3/uL (ref 1.7–17.7)
Neutrophils Relative %: 56 %
Platelets: 119 10*3/uL — ABNORMAL LOW (ref 150–400)
RBC: 3.4 MIL/uL — ABNORMAL LOW (ref 3.87–5.11)
RDW: 14.9 % (ref 11.5–15.5)
WBC: 5 10*3/uL (ref 4.0–10.5)
nRBC: 0 % (ref 0.0–0.2)

## 2019-04-28 LAB — COMPREHENSIVE METABOLIC PANEL
ALT: 10 U/L (ref 0–44)
AST: 17 U/L (ref 15–41)
Albumin: 3.7 g/dL (ref 3.5–5.0)
Alkaline Phosphatase: 69 U/L (ref 38–126)
Anion gap: 11 (ref 5–15)
BUN: 20 mg/dL (ref 8–23)
CO2: 22 mmol/L (ref 22–32)
Calcium: 9.2 mg/dL (ref 8.9–10.3)
Chloride: 108 mmol/L (ref 98–111)
Creatinine, Ser: 0.94 mg/dL (ref 0.44–1.00)
GFR calc Af Amer: >60 mL/min (ref >60)
GFR calc non Af Amer: >60 mL/min (ref >60)
Glucose, Bld: 151 mg/dL — ABNORMAL HIGH (ref 70–99)
Potassium: 3.9 mmol/L (ref 3.5–5.1)
Sodium: 141 mmol/L (ref 135–145)
Total Bilirubin: 0.4 mg/dL (ref 0.3–1.2)
Total Protein: 6.4 g/dL — ABNORMAL LOW (ref 6.5–8.1)

## 2019-04-28 LAB — LACTATE DEHYDROGENASE: LDH: 237 U/L — ABNORMAL HIGH (ref 98–192)

## 2019-04-28 MED ORDER — HEPARIN SOD (PORK) LOCK FLUSH 100 UNIT/ML IV SOLN
500.0000 [IU] | Freq: Once | INTRAVENOUS | Status: AC | PRN
  Administered 2019-04-28: 17:00:00 500 [IU]
  Filled 2019-04-28: qty 5

## 2019-04-28 MED ORDER — DOXORUBICIN HCL CHEMO IV INJECTION 2 MG/ML
35.0000 mg/m2 | Freq: Once | INTRAVENOUS | Status: AC
  Administered 2019-04-28: 12:00:00 70 mg via INTRAVENOUS
  Filled 2019-04-28: qty 35

## 2019-04-28 MED ORDER — PALONOSETRON HCL INJECTION 0.25 MG/5ML
INTRAVENOUS | Status: AC
  Filled 2019-04-28: qty 5

## 2019-04-28 MED ORDER — SODIUM CHLORIDE 0.9% FLUSH
10.0000 mL | INTRAVENOUS | Status: DC | PRN
  Administered 2019-04-28: 17:00:00 10 mL
  Filled 2019-04-28: qty 10

## 2019-04-28 MED ORDER — SODIUM CHLORIDE 0.9% FLUSH
10.0000 mL | Freq: Once | INTRAVENOUS | Status: AC
  Administered 2019-04-28: 08:00:00 10 mL
  Filled 2019-04-28: qty 10

## 2019-04-28 MED ORDER — PEGFILGRASTIM 6 MG/0.6ML ~~LOC~~ PSKT
6.0000 mg | PREFILLED_SYRINGE | Freq: Once | SUBCUTANEOUS | Status: AC
  Administered 2019-04-28: 17:00:00 6 mg via SUBCUTANEOUS

## 2019-04-28 MED ORDER — DIPHENHYDRAMINE HCL 50 MG/ML IJ SOLN
INTRAMUSCULAR | Status: AC
  Filled 2019-04-28: qty 1

## 2019-04-28 MED ORDER — DEXAMETHASONE SODIUM PHOSPHATE 10 MG/ML IJ SOLN: 10.0000 mg | Freq: Once | INTRAMUSCULAR | Status: DC

## 2019-04-28 MED ORDER — SODIUM CHLORIDE 0.9 % IV SOLN
1000.0000 mg | Freq: Once | INTRAVENOUS | Status: AC
  Administered 2019-04-28: 13:00:00 1000 mg via INTRAVENOUS
  Filled 2019-04-28: qty 40

## 2019-04-28 MED ORDER — ACETAMINOPHEN 325 MG PO TABS
ORAL_TABLET | ORAL | Status: AC
  Filled 2019-04-28: qty 2

## 2019-04-28 MED ORDER — PALONOSETRON HCL INJECTION 0.25 MG/5ML
0.2500 mg | Freq: Once | INTRAVENOUS | Status: AC
  Administered 2019-04-28: 10:00:00 0.25 mg via INTRAVENOUS

## 2019-04-28 MED ORDER — SODIUM CHLORIDE 0.9 % IV SOLN
Freq: Once | INTRAVENOUS | Status: AC
  Administered 2019-04-28: 10:00:00 via INTRAVENOUS
  Filled 2019-04-28: qty 250

## 2019-04-28 MED ORDER — SODIUM CHLORIDE 0.9 % IV SOLN
Freq: Once | INTRAVENOUS | Status: AC
  Administered 2019-04-28: 10:00:00 via INTRAVENOUS
  Filled 2019-04-28: qty 5

## 2019-04-28 MED ORDER — DIPHENHYDRAMINE HCL 50 MG/ML IJ SOLN
25.0000 mg | Freq: Once | INTRAMUSCULAR | Status: AC
  Administered 2019-04-28: 25 mg via INTRAVENOUS

## 2019-04-28 MED ORDER — ACETAMINOPHEN 325 MG PO TABS
650.0000 mg | ORAL_TABLET | Freq: Once | ORAL | Status: AC
  Administered 2019-04-28: 10:00:00 650 mg via ORAL

## 2019-04-28 MED ORDER — SODIUM CHLORIDE 0.9 % IV SOLN
550.0000 mg/m2 | Freq: Once | INTRAVENOUS | Status: AC
  Administered 2019-04-28: 12:00:00 1100 mg via INTRAVENOUS
  Filled 2019-04-28: qty 55

## 2019-04-28 MED ORDER — PEGFILGRASTIM 6 MG/0.6ML ~~LOC~~ PSKT
PREFILLED_SYRINGE | SUBCUTANEOUS | Status: AC
  Filled 2019-04-28: qty 0.6

## 2019-04-28 NOTE — Progress Notes (Signed)
Patient received adriamycin push via port a cath. Blood return noted before, during, and after medication received. No complaints or distress during treatment.

## 2019-04-28 NOTE — Patient Instructions (Signed)
Venetoclax oral tablets What is this medicine? VENETOCLAX (ven et oh klax) is a medicine that targets proteins in cancer cells and stops the cancer cells from growing. It is used to treat chronic lymphocytic leukemia, small lymphocytic lymphoma, and acute myelogenous leukemia. This medicine may be used for other purposes; ask your health care provider or pharmacist if you have questions. COMMON BRAND NAME(S): Venclexta What should I tell my health care provider before I take this medicine? They need to know if you have any of these conditions:  gout  high levels of uric acid in the blood  kidney disease  liver disease  low or high levels of potassium, phosphorus, or calcium in the blood  scheduled to receive a vaccine  an unusual or allergic reaction to venetoclax, other medicines, foods, dyes, or preservatives  pregnant or trying to get pregnant  breast-feeding How should I use this medicine? Take this medicine by mouth with food and a glass of water. Follow the directions on the prescription label. Do not cut, crush, or chew this medicine. Do not take with grapefruit juice or eat [place] oranges or starfruit. Take your medicine at regular intervals. Do not take it more often than directed. Do not stop taking except on your doctor's advice. A special MedGuide will be given to you by the pharmacist with each prescription and refill. Be sure to read this information carefully each time. Talk to your pediatrician regarding the use of this medicine in children. Special care may be needed. Overdosage: If you think you have taken too much of this medicine contact a poison control center or emergency room at once. NOTE: This medicine is only for you. Do not share this medicine with others. What if I miss a dose? If you miss a dose, take it as soon as you can. If your next dose is to be taken in less than 16 hours, then do not take the missed dose. Take the next dose at your regular time. Do  not take double or extra doses. If you vomit after a dose, do not take another dose; take the next day's dose at the usual time. What may interact with this medicine? This medicine may interact with the following medications:  bosentan  calcium channel blockers like diltiazem and verapamil  captopril  carvedilol  certain antibiotics like azithromycin, erythromycin, and clarithromycin  certain medicines for fungal infections like fluconazole, itraconazole, ketoconazole, posaconazole, and voriconazole  certain medicines for irregular heart beat like amiodarone, dronedarone, and quinidine  certain medicines for seizures like carbamazepine and phenytoin  ciprofloxacin  conivaptan  cyclosporine  digoxin  efavirenz  etravirine  everolimus  felodipine  grapefruit products, [place] oranges, or starfruit  indinavir  live virus vaccines  lopinavir  modafinil  nafcillin  quercetin  ranolazine  rifampin  ritonavir  sirolimus  St. John's wort  telaprevir  ticagrelor  warfarin This list may not describe all possible interactions. Give your health care provider a list of all the medicines, herbs, non-prescription drugs, or dietary supplements you use. Also tell them if you smoke, drink alcohol, or use illegal drugs. Some items may interact with your medicine. What should I watch for while using this medicine? This medicine can cause serious reactions. To reduce your risk you will need to take other medicine(s) before treatment with this medicine. Take your medicine as directed. You may need blood work done while you are taking this medicine. Drink plenty of fluids while you are taking this medicine. Call your  doctor or health care professional for advice if you get a fever, chills or sore throat, or other symptoms of a cold or flu. Do not treat yourself. This drug decreases your body's ability to fight infections. Try to avoid being around people who are  sick. Do not become pregnant while taking this medicine or for 30 days after stopping it. Women should inform their doctor if they wish to become pregnant or think they might be pregnant. There is a potential for serious side effects to an unborn child. Talk to your health care professional or pharmacist for more information. Do not breast-feed an infant while taking this medicine. This may interfere with the ability to father a child. You should talk to your doctor or health care professional if you are concerned about your fertility. What side effects may I notice from receiving this medicine? Side effects that you should report to your doctor or health care professional as soon as possible:  allergic reactions like skin rash, itching or hives, swelling of the face, lips, or tongue  low blood counts - this medicine may decrease the number of white blood cells, red blood cells and platelets. You may be at increased risk for infections and bleeding  signs and symptoms of infection like fever or chills; cough; sore throat; or pain when urinating  signs and symptoms of tumor lysis syndrome like nausea, vomiting, confusion, shortness of breath, seizures, irregular heartbeat, dark urine, tiredness, muscle pain, and/or joint pain Side effects that usually do not require medical attention (report to your doctor or health care professional if they continue or are bothersome):  back pain  constipation  diarrhea  dizziness  headache  stomach pain  swelling of the ankles, feet, hands This list may not describe all possible side effects. Call your doctor for medical advice about side effects. You may report side effects to FDA at 1-800-FDA-1088. Where should I keep my medicine? Keep out of the reach of children. Store below 30 degrees C (86 degrees F). Keep this medicine in the original package during the first 4 weeks of treatment. Throw away any unused medicine after the expiration date. NOTE:  This sheet is a summary. It may not cover all possible information. If you have questions about this medicine, talk to your doctor, pharmacist, or health care provider.  2020 Elsevier/Gold Standard (2018-04-10 15:07:49)

## 2019-04-28 NOTE — Patient Instructions (Signed)
[facility] Discharge Instructions for Patients Receiving Chemotherapy  Today you received the following chemotherapy agents: Adriamycin, Cytoxan, and Gazyva.  To help prevent nausea and vomiting after your treatment, we encourage you to take your nausea medication as directed.   If you develop nausea and vomiting that is not controlled by your nausea medication, call the clinic.   BELOW ARE SYMPTOMS THAT SHOULD BE REPORTED IMMEDIATELY:  *FEVER GREATER THAN 100.5 F  *CHILLS WITH OR WITHOUT FEVER  NAUSEA AND VOMITING THAT IS NOT CONTROLLED WITH YOUR NAUSEA MEDICATION  *UNUSUAL SHORTNESS OF BREATH  *UNUSUAL BRUISING OR BLEEDING  TENDERNESS IN MOUTH AND THROAT WITH OR WITHOUT PRESENCE OF ULCERS  *URINARY PROBLEMS  *BOWEL PROBLEMS  UNUSUAL RASH Items with * indicate a potential emergency and should be followed up as soon as possible.  Feel free to call the clinic should you have any questions or concerns. The clinic phone number is [phone].  Please show the [facility] at check-in to the Emergency Department and triage nurse.

## 2019-04-28 NOTE — Progress Notes (Signed)
[facility]  [phone] [phone]    ID: [name]   DOB: June 07, 1951  MR#: [id number]  JYN#:[id number]  Patient Care Team: Marton Redwood, MD as PCP - General (Internal Medicine) [name], MD as Consulting Physician (Orthopedic Surgery) [name], [name] Dad, MD as Consulting Physician (Oncology) [name], MD as Consulting Physician (General Surgery) Jett Fukuda, [name], NP as Nurse Practitioner (Hematology and Oncology) OTHER MD:   CHIEF COMPLAINT: Small lymphocytic lymphoma  CURRENT TREATMENT: CHOP/obinutuzumab, IVIG   INTERVAL HISTORY: Maria [name] is here for follow up of her CLL.  She will continue on CHOP therapy (without Vincristine) along with obinutuzumab.  She is due for these today.    Since her last visit, she was started on Lovenox for a left leg DVT.  She has not noted any easy bleeding such as epistaxis or blood in her stool.  She is giving herself injections, and is tolerating it well.    She also underwent an echocardiogram that showed a LVEF of 60-65%.     REVIEW OF SYSTEMS: [name] is feeling moderately well today.  She does continue to have some discomfort and swelling in her left leg.  She takes tylenol if she needs it for discomfort.  She has been mobile.  She otherwise is feeling well.  She would like more information on Venetoclax.    Maria [name] denies any fever, chills, chest apin, palpitations, cough.  She has no nausea, vomiting, bowel/bladder changes, headaches, vision changes, mucositis.  A detailed ROS was otherwise non contributory.    HISTORY OF PRESENT ILLNESS: From the original intake note:  [name] developed what she thought was "the flu" in December of 2002. She noted a large lymph node developing in her right anterior cervical area. 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   Her subsequent history is as  detailed above.   PAST MEDICAL HISTORY: Past Medical History:  Diagnosis Date  . Allergy   . Arthritis   . Asthma   . Cancer [facility])    chronic lymphacytic leukemia  . CLL (chronic lymphocytic leukemia) ([place])   . Diabetes mellitus    PMH  . Headache    migraine  . Hyperlipidemia   . Hypertension   . Hypothyroidism   . Lymphoma ([place])   . Thyroid disease    hypothyroidism  . Wears glasses     PAST SURGICAL HISTORY: Past Surgical History:  Procedure Laterality Date  . ABDOMINAL HYSTERECTOMY    . AXILLARY LYMPH NODE BIOPSY Right 08/21/2018   Procedure: RIGHT AXILLARY LYMPH NODE BIOPSY ERAS PATHWAY;  Surgeon: [name], MD;  Location: [facility];  Service: General;  Laterality: Right;  LMA  . BREAST EXCISIONAL BIOPSY Right   . COLONOSCOPY    . IR IMAGING GUIDED PORT INSERTION  11/10/2018  . LYMPH NODE DISSECTION    . STRABISMUS SURGERY    . TONSILLECTOMY    . TOTAL KNEE ARTHROPLASTY Right 12/03/2016   Procedure: RIGHT TOTAL KNEE ARTHROPLASTY;  Surgeon: [name], MD;  Location: [facility];  Service: Orthopedics;  Laterality: Right;    FAMILY HISTORY: Family History  Problem Relation Age of Onset  . Liver cancer Mother   . Heart disease Father   . Breast cancer Sister 65  . Colon cancer Neg Hx   . Pancreatic cancer Neg Hx   . Rectal cancer Neg Hx   . Stomach cancer Neg Hx  The patient's father died at the age of 64 from a myocardial infarction. The patient's mother died at the age of 45 from primary liver carcinoma. The patient had no brothers. Her one sister, Maria [name], has a history of breast cancer   GYNECOLOGIC HISTORY: Menarche at around age 68. The patient is GX P0. She underwent simple hysterectomy without salpingo-oophorectomy in [place]: (Updated 02/13/2019)  She reports that she recently retired from being a Therapist, sports at [employer], working in the [place], [place] x 5d/week. She lives by herself. She had two cats, [name] and [name], but they are  currently with her sister and [place] thinks they would be more at home there. She attends a Investment [employer], operational.               ADVANCED DIRECTIVES: Completed 02/03/2019. She has named her niece her 14.   HEALTH MAINTENANCE: Social History   Socioeconomic History  . Marital status: Single    Spouse name: Not on file  . Number of children: Not on file  . Years of education: Not on file  . Highest education level: Not on file  Occupational History  . Not on file  Social Needs  . Financial resource strain: Not hard at all  . Food insecurity    Worry: Never true    Inability: Never true  . Transportation needs    Medical: No    Non-medical: No  Tobacco Use  . Smoking status: Never Smoker  . Smokeless tobacco: Never Used  Substance and Sexual Activity  . Alcohol use: Yes    Comment: occasional alcohol intake; once or twice monthly  . Drug use: No  . Sexual activity: Not on file  Lifestyle  . Physical activity    Days per week: 0 days    Minutes per session: 0 min  . Stress: Not at all  Relationships  . Social Herbalist on phone: Once a week    Gets together: Once a week    Attends religious service: Never    Active member of club or organization: No    Attends meetings of clubs or organizations: Never    Relationship status: Never married  . Intimate partner violence    Fear of current or ex partner: No    Emotionally abused: No    Physically abused: No    Forced sexual activity: No  Other Topics Concern  . Not on file  Social History Narrative  . Not on file                Colonoscopy: 06/01/2014/[facility]             PAP:             Bone density:             Lipid panel:  228/114/50/155/ratio 4.6  On 10/01/2016  ALLERGIES: Allergies  Allergen Reactions  . Iohexol Swelling and Rash  . Compazine [Prochlorperazine Edisylate] Other (See Comments)    Mild confusion  . Contrast Media  [Iodinated Diagnostic Agents]   . Lactose Intolerance (Gi)      Diarrhea, gas bloating  . Cephalexin Rash    CURRENT MEDICATIONS: Current Outpatient Medications  Medication Sig Dispense Refill  . acetaminophen (TYLENOL 8 HOUR ARTHRITIS PAIN) 650 MG CR tablet Take 650 mg by mouth every 8 (eight) hours as needed for pain.    Marland Kitchen acyclovir (ZOVIRAX) 200 MG capsule TAKE 1 CAPSULE (200 MG  TOTAL) BY MOUTH 3 (THREE) TIMES DAILY. 270 capsule 1  . allopurinol (ZYLOPRIM) 300 MG tablet Take 1 tablet (300 mg total) by mouth daily. 90 tablet 4  . doxycycline (VIBRA-TABS) 100 MG tablet Take 1 tablet (100 mg total) by mouth daily. 60 tablet 2  . enoxaparin (LOVENOX) 150 MG/ML injection Inject 0.8 mLs (120 mg total) into the skin daily. 30 mL 3  . furosemide (LASIX) 20 MG tablet TAKE 1 TABLET BY MOUTH EVERY DAY 90 tablet 1  . hydrocortisone (ANUSOL-HC) 2.5 % rectal cream Place rectally 3 (three) times daily. 30 g 0  . KLOR-CON M20 20 MEQ tablet TAKE 1 TABLET BY MOUTH EVERY DAY 30 tablet 0  . lidocaine-prilocaine (EMLA) cream Apply to affected area once 30 g 3  . predniSONE (DELTASONE) 20 MG tablet Take 3 tablets (60 mg total) by mouth daily with breakfast. Take days 1-5 starting on CHOP chemo day 15 tablet 6  . rosuvastatin (CRESTOR) 10 MG tablet Take 10 mg by mouth at bedtime.  11  . SYNTHROID 88 MCG tablet Take 88 mcg by mouth daily before breakfast.     . hydrochlorothiazide (HYDRODIURIL) 12.5 MG tablet Take 12.5 mg by mouth daily.     No current facility-administered medications for this visit.     OBJECTIVE:  Vitals:   04/28/19 0827  BP: (!) 138/53  Pulse: 72  Resp: 18  Temp: 97.8 F (36.6 C)  SpO2: 100%    ECOG FS: 1 GENERAL: Patient is a well appearing female in no acute distress HEENT:  Sclerae anicteric.  Oropharynx clear and moist. No ulcerations or evidence of oropharyngeal candidiasis. Neck is supple.  NODES: +left cervical adenopathy BREAST EXAM:  Deferred. LUNGS:  Clear to auscultation bilaterally.  No wheezes or rhonchi. HEART:   Regular rate and rhythm. No murmur appreciated. ABDOMEN:  Soft, nontender.  Positive, normoactive bowel sounds. No organomegaly palpated. MSK:  No focal spinal tenderness to palpation. Full range of motion bilaterally in the upper extremities. EXTREMITIES:  + left leg swelling  SKIN:  Clear with no obvious rashes or skin changes. No nail dyscrasia. NEURO:  Nonfocal. Well oriented.  Appropriate affect.    LAB RESULTS: Lab Results  Component Value Date   WBC 5.0 04/28/2019   NEUTROABS 2.8 04/28/2019   HGB 10.6 (L) 04/28/2019   HCT 33.1 (L) 04/28/2019   MCV 97.4 04/28/2019   PLT 119 (L) 04/28/2019   CMP Latest Ref Rng & Units 04/28/2019 04/16/2019 04/07/2019  Glucose 70 - 99 mg/dL 151(H) 195(H) 122(H)  BUN 8 - 23 mg/dL 20 18 22   Creatinine 0.44 - 1.00 mg/dL 0.94 1.01(H) 0.93  Sodium 135 - 145 mmol/L 141 140 138  Potassium 3.5 - 5.1 mmol/L 3.9 3.7 3.7  Chloride 98 - 111 mmol/L 108 103 105  CO2 22 - 32 mmol/L 22 26 26   Calcium 8.9 - 10.3 mg/dL 9.2 9.3 9.2  Total Protein 6.5 - 8.1 g/dL 6.4(L) 5.9(L) 6.1(L)  Total Bilirubin 0.3 - 1.2 mg/dL 0.4 0.6 0.5  Alkaline Phos 38 - 126 U/L 69 80 75  AST 15 - 41 U/L 17 16 16   ALT 0 - 44 U/L 10 16 7     No results for input(s): LABCA2 in the last 72 hours.   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             (b) rituximab added to ibrutinib, first dose 08/27/2018             (c) hepatitis B studies 08/27/2018 negative             (f) ibrutinib/rituximab discontinued after 10/02/2018 dose w/o obvious response  (6) bendamustine/rituximab started 10/22/2018, discontinued after 1 cycle with rapid progression  (7) obinutuzumab/Gaziva started 11/11/2018  (a) second dose given 11/19/2018 together with chemotherapy  (b) third dose given 12/26/2018 and subsequent doses with chemotherapy  (8) CH[O]P chemotherapy started 11/19/2018             (a) echocardiogram on 11/07/2018 shows EF of 55-60%  (b) second cycle of CH[O]P postponed because of pandemic, given 12/26/2018, greatly reduced doses  (c) cycle 3 of CH[O]P/obinutuzumab given 02/03/2019 at increased doses  (d) cycle 4 and 5 of CH[O]P given on 6/16 and 7/7 at increased doses of 35mg /m2 and 550mg /m2  (e) Repeat echo on 04/22/2019 shows an EF of 60-65%  (9) severe immunocompromise: Marked hypogammaglobulinemia  (a) received IVIG 12/02/2018, repeated 12/17/2018 and 02/13/2019 (given every 8 weeks at this point)  (10) 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  (a) LMWH started 04/23/2019   PLAN:  Maria [name] is doing moderately well today. Her labs are stable and she will proceed with CHOP (minus vincristine) and Obinutuzumab today.  I reviewed her dose and recent echo that was normal with Dr. Jana Hakim who is in agreement with it.  Nunzio Cory and I reviewed Venetoclax in detail and I gave her info in her AVS about this treatment.  She voiced concern over needing a place local to stay due to living in [place].  I reached out to our social  worker, Johnnye Lana about this.    Rohini is tolerating the Lovenox for her left leg DVT.  I recommended she continue to take daily as prescribed and elevate her leg when she can.    Anuradha will return in 3 weeks for labs, f/u, and her next appointment.  She was recommended to continue with the appropriate pandemic precautions. She knows to call for any questions that may arise between now and her next appointment.  We are happy to see her sooner if needed.   A total of (30) minutes of face-to-face time was spent with this patient with greater than 50% of that time in counseling and care-coordination.     Wilber Bihari, NP 04/28/2019   Medical Oncology and Hematology [facility] [address] [place], [place] [address] Tel. [phone]    Fax. [phone]

## 2019-04-28 NOTE — Progress Notes (Signed)
[place] Work  Clinical Social Work received referral for overnight lodging resources.  CSW contacted patient to offer support and assess for needs.  Patient lives 30 miles away and has upcoming appointments on consecutive days.  Patient discussed wanting to regain some independence over her treatment and relieve her family of some responsibilities.  Patient stated her sister lives in [place] and has been supportive, but did not want to ask her to take time away from work.  CSW and patient discusses lodging resources through [facility] and [place].  [facility] as suspended their resources due to covid, and patient must travel at least 50 miles to be eligible for the cancer support community resources.  CSW and patient discussed transportation resources and the [facility] transportation program.  CSW and patient explored the option of staying with her sister and using [place] transportation.  Patient expressed interest and stated she would talk with her sister.  CSW provided contact information for CSW and [facility].  CSW encouraged patient to call with questions or concerns.    Johnnye Lana, MSW, LCSW, OSW-C Clinical Social Worker [facility] [phone]

## 2019-04-29 LAB — IGG, IGA, IGM
IgA: 21 mg/dL — ABNORMAL LOW (ref 87–352)
IgG (Immunoglobin G), Serum: 1151 mg/dL (ref 586–1602)
IgM (Immunoglobulin M), Srm: <5 mg/dL — ABNORMAL LOW (ref 26–217)

## 2019-05-17 ENCOUNTER — Other Ambulatory Visit: Payer: Self-pay | Admitting: Oncology

## 2019-05-17 DIAGNOSIS — C9110 Chronic lymphocytic leukemia of B-cell type not having achieved remission: Secondary | ICD-10-CM

## 2019-05-19 ENCOUNTER — Encounter: Payer: Self-pay | Admitting: Oncology

## 2019-05-19 ENCOUNTER — Inpatient Hospital Stay (HOSPITAL_BASED_OUTPATIENT_CLINIC_OR_DEPARTMENT_OTHER): Payer: Medicare Other | Admitting: Adult Health

## 2019-05-19 ENCOUNTER — Encounter: Payer: Self-pay | Admitting: Adult Health

## 2019-05-19 ENCOUNTER — Other Ambulatory Visit: Payer: Self-pay | Admitting: Oncology

## 2019-05-19 ENCOUNTER — Inpatient Hospital Stay: Payer: Medicare Other

## 2019-05-19 ENCOUNTER — Inpatient Hospital Stay: Payer: Medicare Other | Attending: Oncology

## 2019-05-19 ENCOUNTER — Other Ambulatory Visit: Payer: Self-pay

## 2019-05-19 VITALS — BP 106/53 | HR 62 | Temp 97.7°F | Resp 17

## 2019-05-19 VITALS — BP 125/43 | HR 71 | Temp 98.2°F | Resp 18 | Ht 62.0 in | Wt 177.0 lb

## 2019-05-19 DIAGNOSIS — Z5111 Encounter for antineoplastic chemotherapy: Secondary | ICD-10-CM | POA: Insufficient documentation

## 2019-05-19 DIAGNOSIS — C9110 Chronic lymphocytic leukemia of B-cell type not having achieved remission: Secondary | ICD-10-CM

## 2019-05-19 DIAGNOSIS — D801 Nonfamilial hypogammaglobulinemia: Secondary | ICD-10-CM

## 2019-05-19 DIAGNOSIS — Z5189 Encounter for other specified aftercare: Secondary | ICD-10-CM | POA: Insufficient documentation

## 2019-05-19 DIAGNOSIS — Z95828 Presence of other vascular implants and grafts: Secondary | ICD-10-CM

## 2019-05-19 DIAGNOSIS — Z5112 Encounter for antineoplastic immunotherapy: Secondary | ICD-10-CM | POA: Diagnosis present

## 2019-05-19 LAB — COMPREHENSIVE METABOLIC PANEL
ALT: 6 U/L (ref 0–44)
AST: 14 U/L — ABNORMAL LOW (ref 15–41)
Albumin: 3.8 g/dL (ref 3.5–5.0)
Alkaline Phosphatase: 70 U/L (ref 38–126)
Anion gap: 7 (ref 5–15)
BUN: 16 mg/dL (ref 8–23)
CO2: 24 mmol/L (ref 22–32)
Calcium: 9 mg/dL (ref 8.9–10.3)
Chloride: 109 mmol/L (ref 98–111)
Creatinine, Ser: 0.91 mg/dL (ref 0.44–1.00)
GFR calc Af Amer: >60 mL/min (ref >60)
GFR calc non Af Amer: >60 mL/min (ref >60)
Glucose, Bld: 128 mg/dL — ABNORMAL HIGH (ref 70–99)
Potassium: 4 mmol/L (ref 3.5–5.1)
Sodium: 140 mmol/L (ref 135–145)
Total Bilirubin: 0.4 mg/dL (ref 0.3–1.2)
Total Protein: 5.9 g/dL — ABNORMAL LOW (ref 6.5–8.1)

## 2019-05-19 LAB — CBC WITH DIFFERENTIAL/PLATELET
Abs Immature Granulocytes: 0 10*3/uL (ref 0.00–0.07)
Band Neutrophils: 4 %
Basophils Absolute: 0.1 10*3/uL (ref 0.0–0.1)
Basophils Relative: 2 %
Eosinophils Absolute: 0.1 10*3/uL (ref 0.0–0.5)
Eosinophils Relative: 4 %
HCT: 32.2 % — ABNORMAL LOW (ref 36.0–46.0)
Hemoglobin: 10.4 g/dL — ABNORMAL LOW (ref 12.0–15.0)
Lymphocytes Relative: 25 %
Lymphs Abs: 0.9 10*3/uL (ref 0.7–4.0)
MCH: 31.5 pg (ref 26.0–34.0)
MCHC: 32.3 g/dL (ref 30.0–36.0)
MCV: 97.6 fL (ref 80.0–100.0)
Monocytes Absolute: 0 10*3/uL — ABNORMAL LOW (ref 0.1–1.0)
Monocytes Relative: 1 %
Neutro Abs: 2.4 10*3/uL (ref 1.7–17.7)
Neutrophils Relative %: 64 %
Platelets: 108 10*3/uL — ABNORMAL LOW (ref 150–400)
RBC: 3.3 MIL/uL — ABNORMAL LOW (ref 3.87–5.11)
RDW: 14.6 % (ref 11.5–15.5)
WBC: 3.5 10*3/uL — ABNORMAL LOW (ref 4.0–10.5)
nRBC: 0 % (ref 0.0–0.2)

## 2019-05-19 LAB — LACTATE DEHYDROGENASE: LDH: 264 U/L — ABNORMAL HIGH (ref 98–192)

## 2019-05-19 MED ORDER — SODIUM CHLORIDE 0.9 % IV SOLN
550.0000 mg/m2 | Freq: Once | INTRAVENOUS | Status: AC
  Administered 2019-05-19: 1100 mg via INTRAVENOUS
  Filled 2019-05-19: qty 55

## 2019-05-19 MED ORDER — PALONOSETRON HCL INJECTION 0.25 MG/5ML
0.2500 mg | Freq: Once | INTRAVENOUS | Status: AC
  Administered 2019-05-19: 0.25 mg via INTRAVENOUS

## 2019-05-19 MED ORDER — DIPHENHYDRAMINE HCL 50 MG/ML IJ SOLN
INTRAMUSCULAR | Status: AC
  Filled 2019-05-19: qty 1

## 2019-05-19 MED ORDER — ACETAMINOPHEN 325 MG PO TABS
ORAL_TABLET | ORAL | Status: AC
  Filled 2019-05-19: qty 2

## 2019-05-19 MED ORDER — SODIUM CHLORIDE 0.9 % IV SOLN
1000.0000 mg | Freq: Once | INTRAVENOUS | Status: AC
  Administered 2019-05-19: 1000 mg via INTRAVENOUS
  Filled 2019-05-19: qty 40

## 2019-05-19 MED ORDER — DOXORUBICIN HCL CHEMO IV INJECTION 2 MG/ML
35.0000 mg/m2 | Freq: Once | INTRAVENOUS | Status: AC
  Administered 2019-05-19: 12:00:00 70 mg via INTRAVENOUS
  Filled 2019-05-19: qty 35

## 2019-05-19 MED ORDER — DEXAMETHASONE SODIUM PHOSPHATE 10 MG/ML IJ SOLN
INTRAMUSCULAR | Status: AC
  Filled 2019-05-19: qty 1

## 2019-05-19 MED ORDER — DEXAMETHASONE SODIUM PHOSPHATE 10 MG/ML IJ SOLN: 10.0000 mg | Freq: Once | INTRAMUSCULAR | Status: DC

## 2019-05-19 MED ORDER — PEGFILGRASTIM 6 MG/0.6ML ~~LOC~~ PSKT
PREFILLED_SYRINGE | SUBCUTANEOUS | Status: AC
  Filled 2019-05-19: qty 0.6

## 2019-05-19 MED ORDER — PALONOSETRON HCL INJECTION 0.25 MG/5ML
INTRAVENOUS | Status: AC
  Filled 2019-05-19: qty 5

## 2019-05-19 MED ORDER — SODIUM CHLORIDE 0.9% FLUSH
10.0000 mL | INTRAVENOUS | Status: DC | PRN
  Administered 2019-05-19: 10 mL
  Filled 2019-05-19: qty 10

## 2019-05-19 MED ORDER — SODIUM CHLORIDE 0.9 % IV SOLN
Freq: Once | INTRAVENOUS | Status: AC
  Administered 2019-05-19: 11:00:00 via INTRAVENOUS
  Filled 2019-05-19: qty 5

## 2019-05-19 MED ORDER — PEGFILGRASTIM 6 MG/0.6ML ~~LOC~~ PSKT
6.0000 mg | PREFILLED_SYRINGE | Freq: Once | SUBCUTANEOUS | Status: AC
  Administered 2019-05-19: 6 mg via SUBCUTANEOUS

## 2019-05-19 MED ORDER — DIPHENHYDRAMINE HCL 50 MG/ML IJ SOLN
25.0000 mg | Freq: Once | INTRAMUSCULAR | Status: AC
  Administered 2019-05-19: 11:00:00 25 mg via INTRAVENOUS

## 2019-05-19 MED ORDER — HEPARIN SOD (PORK) LOCK FLUSH 100 UNIT/ML IV SOLN
500.0000 [IU] | Freq: Once | INTRAVENOUS | Status: AC | PRN
  Administered 2019-05-19: 500 [IU]
  Filled 2019-05-19: qty 5

## 2019-05-19 MED ORDER — SODIUM CHLORIDE 0.9 % IV SOLN
Freq: Once | INTRAVENOUS | Status: AC
  Administered 2019-05-19: 11:00:00 via INTRAVENOUS
  Filled 2019-05-19: qty 250

## 2019-05-19 MED ORDER — ACETAMINOPHEN 325 MG PO TABS
650.0000 mg | ORAL_TABLET | Freq: Once | ORAL | Status: AC
  Administered 2019-05-19: 650 mg via ORAL

## 2019-05-19 MED ORDER — SODIUM CHLORIDE 0.9% FLUSH
10.0000 mL | Freq: Once | INTRAVENOUS | Status: AC
  Administered 2019-05-19: 10 mL
  Filled 2019-05-19: qty 10

## 2019-05-19 NOTE — Progress Notes (Signed)
[facility]  Telephone:([phone]) [phone] Fax:(336) [phone]    ID: Maria [name]   DOB: [birth year]  MR#: [id number]  IEP#:[id number]  Patient Care Team: Marton Redwood, MD as PCP - General (Internal Medicine) [name], MD as Consulting Physician (Orthopedic Surgery) [name], [name] Dad, MD as Consulting Physician (Oncology) [name], MD as Consulting Physician (General Surgery) Damyan Corne, [name], NP as Nurse Practitioner (Hematology and Oncology) OTHER MD:   CHIEF COMPLAINT: Small lymphocytic lymphoma  CURRENT TREATMENT: CHOP/obinutuzumab, IVIG   INTERVAL HISTORY: Maria [name] is here for follow up of her CLL.  She will continue on CHOP therapy (without Vincristine) along with obinutuzumab.  She appears to be due for IVIG in 06/2019.   She continues on Lovenox for a left leg DVT.   She notes her left leg swelling is improved, as well as her mobility. She has not noted any easy bleeding such as epistaxis or blood in her stool.  She is giving herself injections, and is tolerating it well.  She did have a scratch from a dog over the weekend, but this is healing well.     REVIEW OF SYSTEMS: Maria [name] continues to be mildly fatigued.  She does have continued lymphadenopathy, however it is no worse, no better.  She denies any fevers, night sweats, new pain, cough, shortness of breath, headaches or vision changes.  She is without nausea or vomiting.  She does note that she has some mild constipation, and that it fluctuates depending on her diet.  Overall she is feeling well.  She is happy that her swelling is improved.    HISTORY OF PRESENT ILLNESS: From the original intake note:  Maria [name] developed what she thought was "the flu" in December of 2002. She noted a large lymph node developing in her right anterior cervical area. 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   Her  subsequent history is as detailed above.   PAST MEDICAL HISTORY: Past Medical History:  Diagnosis Date  . Allergy   . Arthritis   . Asthma   . Cancer [facility])    chronic lymphacytic leukemia  . CLL (chronic lymphocytic leukemia) ([place])   . Diabetes mellitus    PMH  . Headache    migraine  . Hyperlipidemia   . Hypertension   . Hypothyroidism   . Lymphoma ([place])   . Thyroid disease    hypothyroidism  . Wears glasses     PAST SURGICAL HISTORY: Past Surgical History:  Procedure Laterality Date  . ABDOMINAL HYSTERECTOMY    . AXILLARY LYMPH NODE BIOPSY Right 08/21/2018   Procedure: RIGHT AXILLARY LYMPH NODE BIOPSY ERAS PATHWAY;  Surgeon: [name], MD;  Location: [facility];  Service: General;  Laterality: Right;  LMA  . BREAST EXCISIONAL BIOPSY Right   . COLONOSCOPY    . IR IMAGING GUIDED PORT INSERTION  11/10/2018  . LYMPH NODE DISSECTION    . STRABISMUS SURGERY    . TONSILLECTOMY    . TOTAL KNEE ARTHROPLASTY Right 12/03/2016   Procedure: RIGHT TOTAL KNEE ARTHROPLASTY;  Surgeon: [name], MD;  Location: [facility];  Service: Orthopedics;  Laterality: Right;    FAMILY HISTORY: Family History  Problem Relation Age of Onset  . Liver cancer Mother   . Heart disease Father   . Breast cancer Sister 45  . Colon cancer Neg Hx   . Pancreatic cancer Neg Hx   . Rectal cancer Neg Hx   .  Stomach cancer Neg Hx    The patient's father died at the age of 48 from a myocardial infarction. The patient's mother died at the age of 79 from primary liver carcinoma. The patient had no brothers. Her one sister, [name], has a history of breast cancer   GYNECOLOGIC HISTORY: Menarche at around age 33. The patient is GX P0. 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 She lives by herself. She had two cats, Maria [name] and  [place], but they are currently with her sister and Maria [name] they would be more at home there. She attends a [employer], operational.               ADVANCED DIRECTIVES: Completed 02/03/2019. She has named her niece her 33.   HEALTH MAINTENANCE: Social History   Socioeconomic History  . Marital status: Single    Spouse name: Not on file  . Number of children: Not on file  . Years of education: Not on file  . Highest education level: Not on file  Occupational History  . Not on file  Social Needs  . Financial resource strain: Not hard at all  . Food insecurity    Worry: Never true    Inability: Never true  . Transportation needs    Medical: No    Non-medical: No  Tobacco Use  . Smoking status: Never Smoker  . Smokeless tobacco: Never Used  Substance and Sexual Activity  . Alcohol use: Yes    Comment: occasional alcohol intake; once or twice monthly  . Drug use: No  . Sexual activity: Not on file  Lifestyle  . Physical activity    Days per week: 0 days    Minutes per session: 0 min  . Stress: Not at all  Relationships  . Social Herbalist on phone: Once a week    Gets together: Once a week    Attends religious service: Never    Active member of club or organization: No    Attends meetings of clubs or organizations: Never    Relationship status: Never married  . Intimate partner violence    Fear of current or ex partner: No    Emotionally abused: No    Physically abused: No    Forced sexual activity: No  Other Topics Concern  . Not on file  Social History Narrative  . Not on file                Colonoscopy: 06/01/2014/[facility]             PAP:             Bone density:             Lipid panel:  228/114/50/155/ratio 4.6  On 10/01/2016  ALLERGIES: Allergies  Allergen Reactions  . Iohexol Swelling and Rash  . Compazine [Prochlorperazine Edisylate] Other (See Comments)    Mild confusion  . Contrast Media  [Iodinated Diagnostic Agents]   .  Lactose Intolerance (Gi)     Diarrhea, gas bloating  . Cephalexin Rash    CURRENT MEDICATIONS: Current Outpatient Medications  Medication Sig Dispense Refill  . acetaminophen (TYLENOL 8 HOUR ARTHRITIS PAIN) 650 MG CR tablet Take 650 mg by mouth every 8 (eight) hours as needed for pain.    Marland Kitchen acyclovir (ZOVIRAX) 200  MG capsule TAKE 1 CAPSULE (200 MG TOTAL) BY MOUTH 3 (THREE) TIMES DAILY. 270 capsule 1  . allopurinol (ZYLOPRIM) 300 MG tablet TAKE 1 TABLET BY MOUTH EVERY DAY 90 tablet 1  . doxycycline (VIBRA-TABS) 100 MG tablet Take 1 tablet (100 mg total) by mouth daily. 60 tablet 2  . enoxaparin (LOVENOX) 150 MG/ML injection Inject 0.8 mLs (120 mg total) into the skin daily. 30 mL 3  . furosemide (LASIX) 20 MG tablet TAKE 1 TABLET BY MOUTH EVERY DAY 90 tablet 1  . hydrocortisone (ANUSOL-HC) 2.5 % rectal cream Place rectally 3 (three) times daily. 30 g 0  . KLOR-CON M20 20 MEQ tablet TAKE 1 TABLET BY MOUTH EVERY DAY 30 tablet 0  . lidocaine-prilocaine (EMLA) cream Apply to affected area once 30 g 3  . predniSONE (DELTASONE) 20 MG tablet Take 3 tablets (60 mg total) by mouth daily with breakfast. Take days 1-5 starting on CHOP chemo day 15 tablet 6  . rosuvastatin (CRESTOR) 10 MG tablet Take 10 mg by mouth at bedtime.  11  . SYNTHROID 88 MCG tablet Take 88 mcg by mouth daily before breakfast.      No current facility-administered medications for this visit.    Facility-Administered Medications Ordered in Other Visits  Medication Dose Route Frequency Provider Last Rate Last Dose  . cyclophosphamide (CYTOXAN) 1,100 mg in sodium chloride 0.9 % 250 mL chemo infusion  550 mg/m2 (Treatment Plan Recorded) Intravenous Once Gardenia Phlegm, NP 610 mL/hr at 05/19/19 1236 1,100 mg at 05/19/19 1236  . heparin lock flush 100 unit/mL  500 Units Intracatheter Once PRN [name], [name] Dad, MD      . obinutuzumab (GAZYVA) 1,000 mg in sodium chloride 0.9 % 250 mL (3.4483 mg/mL) chemo infusion  1,000 mg  Intravenous Once Eesha Schmaltz, [name], NP      . pegfilgrastim (NEULASTA ONPRO KIT) injection 6 mg  6 mg Subcutaneous Once [name], [name] Dad, MD      . sodium chloride flush (NS) 0.9 % injection 10 mL  10 mL Intracatheter PRN [name], [name] Dad, MD        OBJECTIVE:  Vitals:   05/19/19 0914  BP: (!) 125/43  [name]: 71  Resp: 18  Temp: 98.2 F (36.8 C)  SpO2: 100%    ECOG FS: 1 GENERAL: Patient is a well appearing female in no acute distress HEENT:  Sclerae anicteric.  Oropharynx clear and moist. No ulcerations or evidence of oropharyngeal candidiasis. Neck is supple.  NODES: +left cervical adenopathy BREAST EXAM:  Deferred. LUNGS:  Clear to auscultation bilaterally.  No wheezes or rhonchi. HEART:  Regular rate and rhythm. No murmur appreciated. ABDOMEN:  Soft, nontender.  Positive, normoactive bowel sounds. No organomegaly palpated. MSK:  No focal spinal tenderness to palpation. Full range of motion bilaterally in the upper extremities. EXTREMITIES:  + left leg swelling--improved SKIN:  Clear with no obvious rashes or skin changes. No nail dyscrasia. NEURO:  Nonfocal. Well oriented.  Appropriate affect.    LAB RESULTS: Lab Results  Component Value Date   WBC 3.5 (L) 05/19/2019   NEUTROABS 2.4 05/19/2019   HGB 10.4 (L) 05/19/2019   HCT 32.2 (L) 05/19/2019   MCV 97.6 05/19/2019   PLT 108 (L) 05/19/2019   CMP Latest Ref Rng & Units 05/19/2019 04/28/2019 04/16/2019  Glucose 70 - 99 mg/dL 128(H) 151(H) 195(H)  BUN 8 - 23 mg/dL '16 20 18  '$ Creatinine 0.44 - 1.00 mg/dL 0.91 0.94 1.01(H)  Sodium 135 - 145  mmol/L 140 141 140  Potassium 3.5 - 5.1 mmol/L 4.0 3.9 3.7  Chloride 98 - 111 mmol/L 109 108 103  CO2 22 - 32 mmol/L '24 22 26  '$ Calcium 8.9 - 10.3 mg/dL 9.0 9.2 9.3  Total Protein 6.5 - 8.1 g/dL 5.9(L) 6.4(L) 5.9(L)  Total Bilirubin 0.3 - 1.2 mg/dL 0.4 0.4 0.6  Alkaline Phos 38 - 126 U/L 70 69 80  AST 15 - 41 U/L 14(L) 17 16  ALT 0 - 44 U/L '6 10 16    '$ No results for  input(s): LABCA2 in the last 72 hours.   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             (b) rituximab added to ibrutinib, first dose 08/27/2018             (c) hepatitis B studies 08/27/2018 negative             (f) ibrutinib/rituximab discontinued after 10/02/2018 dose w/o obvious response  (6) bendamustine/rituximab started 10/22/2018, discontinued after 1 cycle with rapid progression  (7) obinutuzumab/Gaziva started 11/11/2018  (a) second dose given 11/19/2018 together with chemotherapy  (b) third dose given 12/26/2018 and subsequent doses with chemotherapy  (8) CH[O]P chemotherapy started 11/19/2018             (a) echocardiogram on 11/07/2018 shows EF of 55-60%  (b) second cycle of CH[O]P postponed because of pandemic, given 12/26/2018, greatly reduced doses  (c) cycle 3 of CH[O]P/obinutuzumab given 02/03/2019 at increased doses  (d) cycle 4 and 5 of CH[O]P given on 6/16 and 7/7 at increased doses of '35mg'$ /m2 and '550mg'$ /m2  (e) Repeat echo on  04/22/2019 shows an EF of 60-65%  (9) severe immunocompromise: Marked hypogammaglobulinemia  (a) received IVIG 12/02/2018, repeated 12/17/2018 and 02/13/2019 (given every 8 weeks at this point)  (10) 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  (a) LMWH started 04/23/2019   PLAN:  Maria [name] continues to do well.  She is tolerating her treatment well and will undergo CHOP (without Vincristine) along with Obinutuzumab today.  We reviewed that this will be her final cycle prior to starting Venetoclax on 06/11/2019.    Maria [name] is tolerating the Lovenox for her left leg DVT and it is much improved.  She seems to be doing much better with it, and has no increased easy bleeding from it, though she does bruise easier than usual.  Maria [name] and I reviewed her future lab orders in detail, and the need for  monitoring so closely because of the risk of tumor lysis with the Venetoclax.  She understands this.  We are placing the orders, and typing everything up today for her.      Madilyn will return in 3 weeks for labs, f/u, and her next appointment.  She was recommended to continue with the appropriate pandemic precautions. She knows to call for any questions that may arise between now and her next appointment.  We are happy to see her sooner if needed.   A total of (30) minutes of face-to-face time was spent with this patient with greater than 50% of that time in counseling and care-coordination.     Wilber Bihari, NP 05/19/2019   Medical Oncology and Hematology [facility] [address] [place], [place] [address] Tel. [phone]    Fax. [phone]

## 2019-05-19 NOTE — Patient Instructions (Signed)

## 2019-05-19 NOTE — Patient Instructions (Signed)
[facility] Discharge Instructions for Patients Receiving Chemotherapy  Today you received the following chemotherapy agents: Adriamycin/Cytoxan/Gazyva.  To help prevent nausea and vomiting after your treatment, we encourage you to take your nausea medication as directed.   If you develop nausea and vomiting that is not controlled by your nausea medication, call the clinic.   BELOW ARE SYMPTOMS THAT SHOULD BE REPORTED IMMEDIATELY:  *FEVER GREATER THAN 100.5 F  *CHILLS WITH OR WITHOUT FEVER  NAUSEA AND VOMITING THAT IS NOT CONTROLLED WITH YOUR NAUSEA MEDICATION  *UNUSUAL SHORTNESS OF BREATH  *UNUSUAL BRUISING OR BLEEDING  TENDERNESS IN MOUTH AND THROAT WITH OR WITHOUT PRESENCE OF ULCERS  *URINARY PROBLEMS  *BOWEL PROBLEMS  UNUSUAL RASH Items with * indicate a potential emergency and should be followed up as soon as possible.  Feel free to call the clinic should you have any questions or concerns. The clinic phone number is [phone].  Please show the [place] at check-in to the Emergency Department and triage nurse.

## 2019-05-20 LAB — IGG, IGA, IGM
IgA: 19 mg/dL — ABNORMAL LOW (ref 87–352)
IgG (Immunoglobin G), Serum: 695 mg/dL (ref 586–1602)
IgM (Immunoglobulin M), Srm: <5 mg/dL — ABNORMAL LOW (ref 26–217)

## 2019-06-01 ENCOUNTER — Other Ambulatory Visit: Payer: Self-pay | Admitting: Oncology

## 2019-06-01 ENCOUNTER — Telehealth: Payer: Self-pay | Admitting: Pharmacist

## 2019-06-01 ENCOUNTER — Telehealth: Payer: Self-pay

## 2019-06-01 DIAGNOSIS — C9110 Chronic lymphocytic leukemia of B-cell type not having achieved remission: Secondary | ICD-10-CM

## 2019-06-01 MED ORDER — ENOXAPARIN SODIUM 30 MG/0.3ML ~~LOC~~ SOLN: 90.0000 mg | SUBCUTANEOUS | 4 refills | Status: DC

## 2019-06-01 MED ORDER — VENCLEXTA STARTING PACK 10 & 50 & 100 MG PO TBPK: ORAL_TABLET | ORAL | 0 refills | Status: DC

## 2019-06-01 MED ORDER — ENOXAPARIN SODIUM 30 MG/0.3ML ~~LOC~~ SOLN: 90.0000 mg | Freq: Two times a day (BID) | SUBCUTANEOUS | 4 refills | Status: DC

## 2019-06-01 MED ORDER — ONDANSETRON HCL 8 MG PO TABS: 4.0000 mg | ORAL_TABLET | Freq: Three times a day (TID) | ORAL | 0 refills | Status: DC | PRN

## 2019-06-01 NOTE — Telephone Encounter (Signed)
Oral Chemotherapy Pharmacist Encounter  I spoke with patient for overview of: Venclexta (venetoclax) for the treatment of chronic lymphocytic leukemia/small cell lymphocytic leukemia, planned duration until disease progression or unacceptable toxicity.  Counseled patient on administration, dosing, side effects, monitoring, drug-food interactions, safe handling, storage, and disposal.  Venclexta is planned to be dosed on up-titration schedule for CLL Week 1: 20mg  by mouth once daily with food and water Week 2: 50mg  by mouth once daily with food and water Week 3: 100mg  by mouth once daily with food and water Week 4: 200mg  by mouth once daily with food and water Week 5: 400mg  by mouth once daily with food and water  Patient will take Venclexta 20mg  tablets, 1 tablet by mouth once daily with food and water for 7 days. The 1st dose of Venclexta 20mg  tablets will be administered in the office based on tumor lysis syndrome (TLS) risk assessment to be moderate risk. Baseline labs will be assessed and the 1st dose will be administered. Electrolytes will be monitored at 4-6 hours post dose administration per protocol. Hydration will be administered orally and IV if needed.  Venclexta start date: 06/11/2019  Patient is already on allopurinol daily. Importance of this supportive care medication discussed with patient. Patient was instructed to increase fluid intake to 1.5 - 2L of water per day starting 2 days prior to [facility] initiation.  Patient will take Venclexta 50mg  tablets, 1 tablet by mouth once daily with food and water for 7 days. The 1st dose of Venclexta 50mg  tablets will be administered in the office based on TLS risk assessment. Baseline labs will be assessed and the 1st dose will be administered. Electrolytes will be monitored at 4-6 hours post dose administration per protocol. Hydration will be administered orally and IV if needed.  Subsequent ramp-up doses of 100mg  daily x 7 days,  200mg  daily x 7 days, and 400mg  daily (target maintenance dose) will be monitored per protocol.  Adverse effects include but are not limited to: TLS, decreased blood counts, electrolyte abnormalities, diarrhea, nausea, fatigue, arthralgias/myalgias, and upper respiratory tract infection.    Patient has lorezapam on hand as an anti-emetic and a prescription for ondansetron has been sent to [facility]'s local [facility]. Patient knows to take it if nausea develops.   Patient will obtain anti diarrheal and alert the office of 4 or more loose stools above baseline.  Reviewed with patient importance of keeping a medication schedule and plan for any missed doses.  Medication reconciliation performed and medication/allergy list updated.  Insurance authorization for Lynita Lombard has been obtained. Test claim at the pharmacy revealed copayment $100 for 1st fill of Venclexta. Oral oncology patient advocate has secured foundation copayment grant to cover out of pocket expenses for the Venclexta at the pharmacy. This will ship from the [facility] on 9/22 to deliver to patient's home on 9/23.  Patient informed the pharmacy will reach out 5-7 days prior to needing next fill of Venclexta to coordinate continued medication acquisition to prevent break in therapy.  Written information for Venclexta initiation will be mailed to patient. We extensively discussed October appointment schedule.  Patient with some complaints of increased bruising and an episode of rectal bleeding the week of 9/14 (in the middle of the week) that resolved after holding enoxaparin for 1 day. Patient with questions about dose decrease of enoxaparin. Message will be sent to MD and collaborative practice RN about this.  All questions answered.  Ms. Poust voiced understanding and appreciation.  Patient knows to call the office with questions or concerns.  Johny Drilling, PharmD, BCPS, BCOP  06/01/2019   10:00 AM Oral  Oncology Clinic [phone]

## 2019-06-01 NOTE — Telephone Encounter (Signed)
Oral Oncology Pharmacist Encounter  Prescription for Venclexta starter pack has been e-scribed to the [facility] outpatient pharmacy.  Planned start date 06/11/2019  Venclexta is planned to be dosed on up-titration schedule for CLL Week 1: 20mg  by mouth once daily with food and water Week 2: 50mg  by mouth once daily with food and water Week 3: 100mg  by mouth once daily with food and water Week 4: 200mg  by mouth once daily with food and water Week 5 and onward: 400mg  by mouth once daily with food and water  400mg  once daily is target dose of Venclexta for CLL  Labs repeated 05/19/2019: Patient may be at a medium tumor burden and at moderate risk for tumor lysis syndrome due to SCr=0.96, round to 1.0 per institutional policy for age> 65 and est CrCl ~ 65 mL/min. There is no recent scan to assess lymph node size, but I would expect them to be < 5cm due to current IV chemotherapy administration. Last CH[o]P administered on 05/19/19  Noted mild leukopenia and moderate thrombocytopenia, will continue to be monitored, OK for treatment initiation  Johny Drilling, PharmD, BCPS, BCOP  06/01/2019 9:59 AM Oral Oncology Clinic [phone]

## 2019-06-01 NOTE — Progress Notes (Signed)
Maria [name] is having some bruising and slight rectal bleeding on lovenox. Iam dropping the dose to 75%. I called and discussed  Incidentally her leg is looking normal.

## 2019-06-01 NOTE — Telephone Encounter (Signed)
Oral Oncology Patient Advocate Encounter   Was successful in securing patient an $65 grant from Patient [place] [facility]) to provide copayment coverage for Venclexta.  This will keep the out of pocket expense at $0.     I have spoken with the patient.    The billing information is as follows and has been shared with [place].   Member ID: [id number] Group ID: [id number] RxBin: [id number] Dates of Eligibility: 07/05/18 through 07/05/19  [place] Patient [place] Phone [phone] Fax [phone] 06/01/2019    11:25 AM

## 2019-06-02 MED FILL — VENCLEXTA STARTING PACK: 10 & 50 & 1 | 28 days supply | Qty: 42 | Fill #0

## 2019-06-02 NOTE — Telephone Encounter (Signed)
Oral Oncology Patient Advocate Encounter  Confirmed with Learned that Venclexta was shipped on 9/22 with a $0 copay using PANF grant.   [place] Patient [place] Phone [phone] Fax [phone] 06/02/2019   1:22 PM

## 2019-06-10 NOTE — Progress Notes (Signed)
[place]  [phone] [phone]    ID: Maria [name]   DOB: 04-Oct-1950  MR#: [id number]  [id number]  Patient Care Team: Marton Redwood, MD as PCP - General (Internal Medicine) [name], MD as Consulting Physician (Orthopedic Surgery) Shamari Trostel, [name] Dad, MD as Consulting Physician (Oncology) [name], MD as Consulting Physician (General Surgery) Causey, [name], NP as Nurse Practitioner (Hematology and Oncology) OTHER MD:  CHIEF COMPLAINT: Small lymphocytic lymphoma  CURRENT TREATMENT: venetoclax/obinutuzumab, IVIG   INTERVAL HISTORY: Maria [name] is here for follow up and treatment of her CLL.    She had her final CHOP treatments 05/19/2019.  This was her eighth cycle.  She generally tolerated it well, and it did "debulk" her tumor but it did not produce a complete response.  She received this along with obinutuzumab.  She tolerates the antibody with no side effects that she is aware of  She also continues on IVIG every 8 weeks.  She has a dose due today.   Her most recent echocardiogram was performed on 04/22/2019 and showed an ejection fraction of 60-65%.  She continues on Lovenox for a left leg DVT. Her dose was reduced due to bruising and slight rectal bleeding.  She is now receiving 90 mg subcu daily.  She has had no more significant bruising and no overt bleeding  She is starting her venetoclax today  REVIEW OF SYSTEMS: Maria [name]'s hair is coming in.  It is a little punky she says.  She is taking little walks.  She went to the grocery store but otherwise she pretty much stays in and is taking very careful pandemic precautions.  She has had no fevers, no rash, no unexplained fatigue or weight loss and generally no "B" symptoms.  A detailed review of systems today was otherwise stable   HISTORY OF PRESENT ILLNESS: From the original intake note:  Maria [name] developed what she thought was "the flu" in December  of 2002. She noted a large lymph node developing in her right anterior cervical area. 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   Her subsequent history is as detailed above.   PAST MEDICAL HISTORY: Past Medical History:  Diagnosis Date  . Allergy   . Arthritis   . Asthma   . Cancer [facility])    chronic lymphacytic leukemia  . CLL (chronic lymphocytic leukemia) ([place])   . Diabetes mellitus    PMH  . Headache    migraine  . Hyperlipidemia   . Hypertension   . Hypothyroidism   . Lymphoma ([place])   . Thyroid disease    hypothyroidism  . Wears glasses     PAST SURGICAL HISTORY: Past Surgical History:  Procedure Laterality Date  . ABDOMINAL HYSTERECTOMY    . AXILLARY LYMPH NODE BIOPSY Right 08/21/2018   Procedure: RIGHT AXILLARY LYMPH NODE BIOPSY ERAS PATHWAY;  Surgeon: [name], MD;  Location: [facility];  Service: General;  Laterality: Right;  LMA  . BREAST EXCISIONAL BIOPSY Right   . COLONOSCOPY    . IR IMAGING GUIDED PORT INSERTION  11/10/2018  . LYMPH NODE DISSECTION    . STRABISMUS SURGERY    . TONSILLECTOMY    . TOTAL KNEE ARTHROPLASTY Right 12/03/2016   Procedure: RIGHT TOTAL KNEE ARTHROPLASTY;  Surgeon: [name], MD;  Location: [facility];  Service: Orthopedics;  Laterality: Right;    FAMILY HISTORY: Family History  Problem Relation Age of Onset  .  Liver cancer Mother   . Heart disease Father   . Breast cancer Sister 78  . Colon cancer Neg Hx   . Pancreatic cancer Neg Hx   . Rectal cancer Neg Hx   . Stomach cancer Neg Hx    The patient's father died at the age of 29 from a myocardial infarction. The patient's mother died at the age of 70 from primary liver carcinoma. The patient had no brothers. Her one sister, [name], has a history of breast cancer   GYNECOLOGIC HISTORY: Menarche at around age 67. The patient is GX P0. She underwent simple hysterectomy without  salpingo-oophorectomy in [place]: (Updated 02/13/2019)  She reports that she recently retired from being a Therapist, sports at [employer], working in the [place], [place] x 5d/week. She lives by herself. She had two cats, [name] and [place], but they are currently with her sister and Maria [name] thinks they would be more at home there. She attends a [employer], operational.               ADVANCED DIRECTIVES: Completed 02/03/2019. She has named her niece her 30.   HEALTH MAINTENANCE: Social History   Socioeconomic History  . Marital status: Single    Spouse name: Not on file  . Number of children: Not on file  . Years of education: Not on file  . Highest education level: Not on file  Occupational History  . Not on file  Social Needs  . Financial resource strain: Not hard at all  . Food insecurity    Worry: Never true    Inability: Never true  . Transportation needs    Medical: No    Non-medical: No  Tobacco Use  . Smoking status: Never Smoker  . Smokeless tobacco: Never Used  Substance and Sexual Activity  . Alcohol use: Yes    Comment: occasional alcohol intake; once or twice monthly  . Drug use: No  . Sexual activity: Not on file  Lifestyle  . Physical activity    Days per week: 0 days    Minutes per session: 0 min  . Stress: Not at all  Relationships  . Social Herbalist on phone: Once a week    Gets together: Once a week    Attends religious service: Never    Active member of club or organization: No    Attends meetings of clubs or organizations: Never    Relationship status: Never married  . Intimate partner violence    Fear of current or ex partner: No    Emotionally abused: No    Physically abused: No    Forced sexual activity: No  Other Topics Concern  . Not on file  Social History Narrative  . Not on file                Colonoscopy: 06/01/2014/[facility]             PAP:             Bone density:             Lipid panel:  228/114/50/155/ratio 4.6  On  10/01/2016  ALLERGIES: Allergies  Allergen Reactions  . Iohexol Swelling and Rash  . Compazine [Prochlorperazine Edisylate] Other (See Comments)    Mild confusion  . Contrast Media  [Iodinated Diagnostic Agents]   . Lactose Intolerance (Gi)     Diarrhea, gas bloating  . Cephalexin Rash    CURRENT MEDICATIONS: Current  Outpatient Medications  Medication Sig Dispense Refill  . acetaminophen (TYLENOL 8 HOUR ARTHRITIS PAIN) 650 MG CR tablet Take 650 mg by mouth every 8 (eight) hours as needed for pain.    Marland Kitchen acyclovir (ZOVIRAX) 200 MG capsule TAKE 1 CAPSULE (200 MG TOTAL) BY MOUTH 3 (THREE) TIMES DAILY. 270 capsule 1  . allopurinol (ZYLOPRIM) 300 MG tablet TAKE 1 TABLET BY MOUTH EVERY DAY 90 tablet 1  . doxycycline (VIBRA-TABS) 100 MG tablet Take 1 tablet (100 mg total) by mouth daily. 60 tablet 2  . enoxaparin (LOVENOX) 30 MG/0.3ML injection Inject 0.9 mLs (90 mg total) into the skin daily. 30 mL 4  . furosemide (LASIX) 20 MG tablet TAKE 1 TABLET BY MOUTH EVERY DAY 90 tablet 1  . hydrocortisone (ANUSOL-HC) 2.5 % rectal cream Place rectally 3 (three) times daily. 30 g 0  . KLOR-CON M20 20 MEQ tablet TAKE 1 TABLET BY MOUTH EVERY DAY 30 tablet 0  . lidocaine-prilocaine (EMLA) cream Apply to affected area once 30 g 3  . ondansetron (ZOFRAN) 8 MG tablet Take 0.5-1 tablets (4-8 mg total) by mouth every 8 (eight) hours as needed for nausea or vomiting. 30 tablet 0  . predniSONE (DELTASONE) 20 MG tablet Take 3 tablets (60 mg total) by mouth daily with breakfast. Take days 1-5 starting on CHOP chemo day 15 tablet 6  . rosuvastatin (CRESTOR) 10 MG tablet Take 10 mg by mouth at bedtime.  11  . SYNTHROID 88 MCG tablet Take 88 mcg by mouth daily before breakfast.     . venetoclax (VENCLEXTA STARTING PACK) 10 & 50 & 100 MG TBPK Week1: Take 20mg  once daily. [id number]: Take 50mg  once daily. [id number]: Take 100mg  once daily. [id number]: Take 200mg  once daily. Take w food & water 42 each 0   No current  facility-administered medications for this visit.     OBJECTIVE: Middle-aged white woman in no acute distress Vitals:   06/11/19 0837  BP: (!) 127/49  [name]: 78  Resp: 18  Temp: 98.2 F (36.8 C)  SpO2: 100%   Filed Weights   06/11/19 0837  Weight: 177 lb 1.6 oz (80.3 kg)   Body mass index is 32.39 kg/m.  Sclerae unicteric, EOMs intact Wearing a mask There is an easily palpable lower right neck lymph node measuring approximately 1-1/2 cm.  I do not palpate significant adenopathy in the right axilla.  In the left axilla there is a large soft lymph node measuring perhaps 2-1/2 cm.  There is also very small left cervical adenopathy. Lungs no rales or rhonchi Heart regular rate and rhythm Abd soft, nontender, positive bowel sounds MSK no focal spinal tenderness, no upper extremity lymphedema Neuro: nonfocal, well oriented, appropriate affect Breasts: No suspicious masses noted    LAB RESULTS: Lab Results  Component Value Date   WBC 4.7 06/11/2019   NEUTROABS 3.3 06/11/2019   HGB 11.2 (L) 06/11/2019   HCT 34.1 (L) 06/11/2019   MCV 94.5 06/11/2019   PLT 113 (L) 06/11/2019   CMP Latest Ref Rng & Units 06/11/2019 05/19/2019 04/28/2019  Glucose 70 - 99 mg/dL 105(H) 128(H) 151(H)  BUN 8 - 23 mg/dL 15 16 20   Creatinine 0.44 - 1.00 mg/dL 0.78 0.91 0.94  Sodium 135 - 145 mmol/L 138 140 141  Potassium 3.5 - 5.1 mmol/L 3.9 4.0 3.9  Chloride 98 - 111 mmol/L 107 109 108  CO2 22 - 32 mmol/L 24 24 22   Calcium 8.9 - 10.3 mg/dL 8.8(L) 9.0 9.2  Total  Protein 6.5 - 8.1 g/dL 6.0(L) 5.9(L) 6.4(L)  Total Bilirubin 0.3 - 1.2 mg/dL 0.3 0.4 0.4  Alkaline Phos 38 - 126 U/L 111 70 69  AST 15 - 41 U/L 24 14(L) 17  ALT 0 - 44 U/L 13 6 10     No results for input(s): LABCA2 in the last 72 hours.   STUDIES: No results found.   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             (b) rituximab added to ibrutinib, first dose 08/27/2018             (c) hepatitis B studies 08/27/2018 negative             (f) ibrutinib/rituximab discontinued after 10/02/2018 dose w/o obvious response  (6) bendamustine/rituximab started 10/22/2018, discontinued after 1 cycle with rapid progression  (7) obinutuzumab/Gaziva started 11/11/2018  (a) second dose given 11/19/2018 together with chemotherapy  (b) third dose given 12/26/2018 and continuing every 3 weeks  (8) CH[O]P chemotherapy started 11/19/2018, completed 8 doses 05/19/2019             (a) echocardiogram on 11/07/2018 shows EF of 55-60%  (b) second cycle of CH[O]P postponed because of pandemic, given 12/26/2018, greatly reduced doses  (c) cycle 3 of CH[O]P/obinutuzumab given 02/03/2019 at increased doses  (d) cycle 4 and 5 of CH[O]P given on 6/16 and 7/7 at increased doses of 35mg /m2 and 550mg /m2  (e) Repeat echo on 04/22/2019 shows an EF of 60-65%  (9) severe immunocompromise: Marked hypogammaglobulinemia  (a) received IVIG 12/02/2018, repeated every 8 weeks   (10) 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  diagnosed 04/23/2019  (a) LMWH started 04/23/2019  (b) 75% dose reduction September 2020 secondary to bruising  (11) starting venetoclax ramp-up 06/11/2019  PLAN:  Shawntaya looks terrific, and has a very good understanding of her complex treatment plan.  She will start the venetoclax ramp-up this morning.  She will return tomorrow for her antibody treatment.   She was supposed to have received her IVIG treatment today but we are going to move that to a week from yesterday since there is no of availability today.  We again reviewed the possible toxicities side effects and complications of venetoclax and she has been asked to call us with any change that she notices but she has been significantly debulked and I think she will tolerate the ramp-up without any unusual side effects.  She is also doing well with her Lovenox.  The plan is to  continue that for a minimum of 6 months.  She will return to see me next week.  She knows to call for any other issue that may develop before the next visit.  [name] Dad. Presli Fanguy, MD 06/11/2019   Medical Oncology and Hematology [facility] [place], [place] [address] Tel. [phone]    Fax. [phone]   I, Wilburn Mylar, am acting as scribe for Dr. [name] Dad. Hildred Mollica.  I, Lurline Del MD, have reviewed the above documentation for accuracy and completeness, and I agree with the above.

## 2019-06-11 ENCOUNTER — Other Ambulatory Visit: Payer: Self-pay

## 2019-06-11 ENCOUNTER — Ambulatory Visit: Payer: Medicare Other

## 2019-06-11 ENCOUNTER — Telehealth: Payer: Self-pay | Admitting: Oncology

## 2019-06-11 ENCOUNTER — Other Ambulatory Visit: Payer: Self-pay | Admitting: *Deleted

## 2019-06-11 ENCOUNTER — Inpatient Hospital Stay: Payer: Medicare Other

## 2019-06-11 ENCOUNTER — Other Ambulatory Visit: Payer: Medicare Other

## 2019-06-11 ENCOUNTER — Ambulatory Visit: Payer: Medicare Other | Admitting: Adult Health

## 2019-06-11 ENCOUNTER — Inpatient Hospital Stay: Payer: Medicare Other | Attending: Oncology | Admitting: Oncology

## 2019-06-11 VITALS — BP 127/49 | HR 78 | Temp 98.2°F | Resp 18 | Ht 62.0 in | Wt 177.1 lb

## 2019-06-11 DIAGNOSIS — D801 Nonfamilial hypogammaglobulinemia: Secondary | ICD-10-CM | POA: Diagnosis not present

## 2019-06-11 DIAGNOSIS — Z5112 Encounter for antineoplastic immunotherapy: Secondary | ICD-10-CM | POA: Diagnosis not present

## 2019-06-11 DIAGNOSIS — D696 Thrombocytopenia, unspecified: Secondary | ICD-10-CM

## 2019-06-11 DIAGNOSIS — Z95828 Presence of other vascular implants and grafts: Secondary | ICD-10-CM

## 2019-06-11 DIAGNOSIS — Z7189 Other specified counseling: Secondary | ICD-10-CM | POA: Diagnosis not present

## 2019-06-11 DIAGNOSIS — C9110 Chronic lymphocytic leukemia of B-cell type not having achieved remission: Secondary | ICD-10-CM

## 2019-06-11 LAB — CBC WITH DIFFERENTIAL/PLATELET
Abs Immature Granulocytes: 0.02 10*3/uL (ref 0.00–0.07)
Basophils Absolute: 0.1 10*3/uL (ref 0.0–0.1)
Basophils Relative: 1 %
Eosinophils Absolute: 0.1 10*3/uL (ref 0.0–0.5)
Eosinophils Relative: 2 %
HCT: 34.1 % — ABNORMAL LOW (ref 36.0–46.0)
Hemoglobin: 11.2 g/dL — ABNORMAL LOW (ref 12.0–15.0)
Immature Granulocytes: 0 %
Lymphocytes Relative: 18 %
Lymphs Abs: 0.9 10*3/uL (ref 0.7–4.0)
MCH: 31 pg (ref 26.0–34.0)
MCHC: 32.8 g/dL (ref 30.0–36.0)
MCV: 94.5 fL (ref 80.0–100.0)
Monocytes Absolute: 0.4 10*3/uL (ref 0.1–1.0)
Monocytes Relative: 9 %
Neutro Abs: 3.3 10*3/uL (ref 1.7–7.7)
Neutrophils Relative %: 70 %
Platelets: 113 10*3/uL — ABNORMAL LOW (ref 150–400)
RBC: 3.61 MIL/uL — ABNORMAL LOW (ref 3.87–5.11)
RDW: 14.2 % (ref 11.5–15.5)
WBC: 4.7 10*3/uL (ref 4.0–10.5)
nRBC: 0 % (ref 0.0–0.2)

## 2019-06-11 LAB — URIC ACID: Uric Acid, Serum: 3.5 mg/dL (ref 2.5–7.1)

## 2019-06-11 LAB — COMPREHENSIVE METABOLIC PANEL
ALT: 13 U/L (ref 0–44)
AST: 24 U/L (ref 15–41)
Albumin: 3.8 g/dL (ref 3.5–5.0)
Alkaline Phosphatase: 111 U/L (ref 38–126)
Anion gap: 7 (ref 5–15)
BUN: 15 mg/dL (ref 8–23)
CO2: 24 mmol/L (ref 22–32)
Calcium: 8.8 mg/dL — ABNORMAL LOW (ref 8.9–10.3)
Chloride: 107 mmol/L (ref 98–111)
Creatinine, Ser: 0.78 mg/dL (ref 0.44–1.00)
GFR calc Af Amer: >60 mL/min (ref >60)
GFR calc non Af Amer: >60 mL/min (ref >60)
Glucose, Bld: 105 mg/dL — ABNORMAL HIGH (ref 70–99)
Potassium: 3.9 mmol/L (ref 3.5–5.1)
Sodium: 138 mmol/L (ref 135–145)
Total Bilirubin: 0.3 mg/dL (ref 0.3–1.2)
Total Protein: 6 g/dL — ABNORMAL LOW (ref 6.5–8.1)

## 2019-06-11 LAB — LACTATE DEHYDROGENASE: LDH: 312 U/L — ABNORMAL HIGH (ref 98–192)

## 2019-06-11 LAB — PHOSPHORUS: Phosphorus: 3.2 mg/dL (ref 2.5–4.6)

## 2019-06-11 MED ORDER — SODIUM CHLORIDE 0.9% FLUSH
10.0000 mL | Freq: Once | INTRAVENOUS | Status: AC
  Administered 2019-06-11: 10 mL
  Filled 2019-06-11: qty 10

## 2019-06-11 NOTE — Telephone Encounter (Signed)
[facility] PAL 10/29 moved f/u from [facility] to [facility]. Confirmed with patient.

## 2019-06-12 ENCOUNTER — Inpatient Hospital Stay: Payer: Medicare Other

## 2019-06-12 ENCOUNTER — Telehealth: Payer: Self-pay | Admitting: *Deleted

## 2019-06-12 ENCOUNTER — Other Ambulatory Visit: Payer: Self-pay

## 2019-06-12 VITALS — BP 122/55 | HR 73 | Temp 98.0°F | Resp 16

## 2019-06-12 DIAGNOSIS — C9110 Chronic lymphocytic leukemia of B-cell type not having achieved remission: Secondary | ICD-10-CM

## 2019-06-12 DIAGNOSIS — Z5112 Encounter for antineoplastic immunotherapy: Secondary | ICD-10-CM | POA: Diagnosis not present

## 2019-06-12 LAB — COMPREHENSIVE METABOLIC PANEL
ALT: 11 U/L (ref 0–44)
AST: 30 U/L (ref 15–41)
Albumin: 3.8 g/dL (ref 3.5–5.0)
Alkaline Phosphatase: 110 U/L (ref 38–126)
Anion gap: 8 (ref 5–15)
BUN: 16 mg/dL (ref 8–23)
CO2: 23 mmol/L (ref 22–32)
Calcium: 8.8 mg/dL — ABNORMAL LOW (ref 8.9–10.3)
Chloride: 108 mmol/L (ref 98–111)
Creatinine, Ser: 0.76 mg/dL (ref 0.44–1.00)
GFR calc Af Amer: >60 mL/min (ref >60)
GFR calc non Af Amer: >60 mL/min (ref >60)
Glucose, Bld: 104 mg/dL — ABNORMAL HIGH (ref 70–99)
Potassium: 4 mmol/L (ref 3.5–5.1)
Sodium: 139 mmol/L (ref 135–145)
Total Bilirubin: 0.4 mg/dL (ref 0.3–1.2)
Total Protein: 6 g/dL — ABNORMAL LOW (ref 6.5–8.1)

## 2019-06-12 LAB — CBC WITH DIFFERENTIAL/PLATELET
Abs Immature Granulocytes: 0.02 10*3/uL (ref 0.00–0.07)
Basophils Absolute: 0.1 10*3/uL (ref 0.0–0.1)
Basophils Relative: 1 %
Eosinophils Absolute: 0.1 10*3/uL (ref 0.0–0.5)
Eosinophils Relative: 2 %
HCT: 34.6 % — ABNORMAL LOW (ref 36.0–46.0)
Hemoglobin: 11.2 g/dL — ABNORMAL LOW (ref 12.0–15.0)
Immature Granulocytes: 0 %
Lymphocytes Relative: 14 %
Lymphs Abs: 0.8 10*3/uL (ref 0.7–4.0)
MCH: 30.9 pg (ref 26.0–34.0)
MCHC: 32.4 g/dL (ref 30.0–36.0)
MCV: 95.6 fL (ref 80.0–100.0)
Monocytes Absolute: 0.4 10*3/uL (ref 0.1–1.0)
Monocytes Relative: 8 %
Neutro Abs: 4 10*3/uL (ref 1.7–7.7)
Neutrophils Relative %: 75 %
Platelets: 110 10*3/uL — ABNORMAL LOW (ref 150–400)
RBC: 3.62 MIL/uL — ABNORMAL LOW (ref 3.87–5.11)
RDW: 14.4 % (ref 11.5–15.5)
WBC: 5.4 10*3/uL (ref 4.0–10.5)
nRBC: 0 % (ref 0.0–0.2)

## 2019-06-12 LAB — PHOSPHORUS: Phosphorus: 5 mg/dL — ABNORMAL HIGH (ref 2.5–4.6)

## 2019-06-12 LAB — URIC ACID: Uric Acid, Serum: 4.2 mg/dL (ref 2.5–7.1)

## 2019-06-12 MED ORDER — DIPHENHYDRAMINE HCL 50 MG/ML IJ SOLN
25.0000 mg | Freq: Once | INTRAMUSCULAR | Status: AC
  Administered 2019-06-12: 25 mg via INTRAVENOUS

## 2019-06-12 MED ORDER — SODIUM CHLORIDE 0.9 % IV SOLN
1000.0000 mg | Freq: Once | INTRAVENOUS | Status: AC
  Administered 2019-06-12: 1000 mg via INTRAVENOUS
  Filled 2019-06-12: qty 40

## 2019-06-12 MED ORDER — ACETAMINOPHEN 325 MG PO TABS
ORAL_TABLET | ORAL | Status: AC
  Filled 2019-06-12: qty 2

## 2019-06-12 MED ORDER — SODIUM CHLORIDE 0.9% FLUSH
10.0000 mL | INTRAVENOUS | Status: DC | PRN
  Administered 2019-06-12: 10 mL
  Filled 2019-06-12: qty 10

## 2019-06-12 MED ORDER — SODIUM CHLORIDE 0.9 % IV SOLN
INTRAVENOUS | Status: DC
  Administered 2019-06-12: 11:00:00 via INTRAVENOUS
  Filled 2019-06-12 (×2): qty 250

## 2019-06-12 MED ORDER — SODIUM CHLORIDE 0.9 % IV SOLN
INTRAVENOUS | Status: AC
  Filled 2019-06-12: qty 250

## 2019-06-12 MED ORDER — DEXAMETHASONE SODIUM PHOSPHATE 10 MG/ML IJ SOLN
10.0000 mg | Freq: Once | INTRAMUSCULAR | Status: AC
  Administered 2019-06-12: 10 mg via INTRAVENOUS

## 2019-06-12 MED ORDER — DEXAMETHASONE SODIUM PHOSPHATE 10 MG/ML IJ SOLN
INTRAMUSCULAR | Status: AC
  Filled 2019-06-12: qty 1

## 2019-06-12 MED ORDER — ACETAMINOPHEN 325 MG PO TABS
650.0000 mg | ORAL_TABLET | Freq: Once | ORAL | Status: AC
  Administered 2019-06-12: 11:00:00 650 mg via ORAL

## 2019-06-12 MED ORDER — DIPHENHYDRAMINE HCL 50 MG/ML IJ SOLN
INTRAMUSCULAR | Status: AC
  Filled 2019-06-12: qty 1

## 2019-06-12 MED ORDER — HEPARIN SOD (PORK) LOCK FLUSH 100 UNIT/ML IV SOLN
500.0000 [IU] | Freq: Once | INTRAVENOUS | Status: AC | PRN
  Administered 2019-06-12: 16:00:00 500 [IU]
  Filled 2019-06-12: qty 5

## 2019-06-12 NOTE — Progress Notes (Signed)
Ok to proceed with premedications after labs and not wait for results of labs. Will proceed with Dyann Kief once labs result - all per MD [name]

## 2019-06-12 NOTE — Patient Instructions (Signed)
[facility] Discharge Instructions for Patients Receiving Chemotherapy  Today you received the following chemotherapy agents: Gazyva   To help prevent nausea and vomiting after your treatment, we encourage you to take your nausea medication as directed.    If you develop nausea and vomiting that is not controlled by your nausea medication, call the clinic.   BELOW ARE SYMPTOMS THAT SHOULD BE REPORTED IMMEDIATELY:  *FEVER GREATER THAN 100.5 F  *CHILLS WITH OR WITHOUT FEVER  NAUSEA AND VOMITING THAT IS NOT CONTROLLED WITH YOUR NAUSEA MEDICATION  *UNUSUAL SHORTNESS OF BREATH  *UNUSUAL BRUISING OR BLEEDING  TENDERNESS IN MOUTH AND THROAT WITH OR WITHOUT PRESENCE OF ULCERS  *URINARY PROBLEMS  *BOWEL PROBLEMS  UNUSUAL RASH Items with * indicate a potential emergency and should be followed up as soon as possible.  Feel free to call the clinic should you have any questions or concerns. The clinic phone number is [phone].  Please show the CHEMO ALERT CARD at check-in to the Emergency Department and triage nurse.   

## 2019-06-12 NOTE — Telephone Encounter (Signed)
Lab results - cbc, cmet and uric acid reviewed per d2 of venetoclax therapy initiation with Ok given to proceed with obinutuzumab today as ordered per [facility]/NP.  Nurse to verify pt is drinking well with goal for 1.5 -2 liters of fluid intake daily while on venetoclax.  Treatment room nurse will be notified.

## 2019-06-13 ENCOUNTER — Other Ambulatory Visit: Payer: Self-pay | Admitting: Oncology

## 2019-06-13 DIAGNOSIS — C9110 Chronic lymphocytic leukemia of B-cell type not having achieved remission: Secondary | ICD-10-CM

## 2019-06-13 LAB — IGG, IGA, IGM
IgA: 19 mg/dL — ABNORMAL LOW (ref 87–352)
IgG (Immunoglobin G), Serum: 447 mg/dL — ABNORMAL LOW (ref 586–1602)
IgM (Immunoglobulin M), Srm: <5 mg/dL — ABNORMAL LOW (ref 26–217)

## 2019-06-14 ENCOUNTER — Other Ambulatory Visit: Payer: Self-pay | Admitting: Oncology

## 2019-06-17 NOTE — Progress Notes (Signed)
Maria [name]  [phone] [phone]) [phone]    ID: Maria [name]   DOB: [birth year]  MR#: [id number]  [id number]  Patient Care Team: Marton Redwood, MD as PCP - General (Internal Medicine) [name], MD as Consulting Physician (Orthopedic Surgery) Arran Fessel, [name] Dad, MD as Consulting Physician (Oncology) [name], MD as Consulting Physician (General Surgery) Causey, [name], NP as Nurse Practitioner (Hematology and Oncology) OTHER MD:  CHIEF COMPLAINT: Small lymphocytic lymphoma  CURRENT TREATMENT: venetoclax/obinutuzumab, IVIG   INTERVAL HISTORY: Maria [name] is here for follow up and treatment of her CLL.  She started venetoclax 06/11/2019, 20 mg daily.  She did very well with her first week.  She had very mild loose stools, for which she did not take anything.  She felt perhaps a little bit more fatigued, but again she did all her usual activities.  She did not have any rash, fever, nausea, confusion, or really any other side effects that she was aware of.  She started 50 mg daily this morning before coming here.  She continues on Lovenox at reduced dose, 90 mg daily.  She gets some bruising from that but no other side effects.  She received IVIG last week.  Her next dose will be mid December.    REVIEW OF SYSTEMS: [name] continues to take appropriate pandemic precautions.  She is very careful when she goes out which she does not do very frequently.  She did get a haircut and she did get a shampoo which she greatly enjoyed.  A detailed review of systems today was otherwise entirely stable.   HISTORY OF PRESENT ILLNESS: From the original intake note:  Maria [name] developed what she thought was "the flu" in December of 2002. She noted a large lymph node developing in her right anterior cervical area. 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    Her subsequent history is as detailed above.   PAST MEDICAL HISTORY: Past Medical History:  Diagnosis Date  . Allergy   . Arthritis   . Asthma   . Cancer [facility])    chronic lymphacytic leukemia  . CLL (chronic lymphocytic leukemia) ([place])   . Diabetes mellitus    PMH  . Headache    migraine  . Hyperlipidemia   . Hypertension   . Hypothyroidism   . Lymphoma ([place])   . Thyroid disease    hypothyroidism  . Wears glasses     PAST SURGICAL HISTORY: Past Surgical History:  Procedure Laterality Date  . ABDOMINAL HYSTERECTOMY    . AXILLARY LYMPH NODE BIOPSY Right 08/21/2018   Procedure: RIGHT AXILLARY LYMPH NODE BIOPSY ERAS PATHWAY;  Surgeon: [name], MD;  Location: [facility];  Service: General;  Laterality: Right;  LMA  . BREAST EXCISIONAL BIOPSY Right   . COLONOSCOPY    . IR IMAGING GUIDED PORT INSERTION  11/10/2018  . LYMPH NODE DISSECTION    . STRABISMUS SURGERY    . TONSILLECTOMY    . TOTAL KNEE ARTHROPLASTY Right 12/03/2016   Procedure: RIGHT TOTAL KNEE ARTHROPLASTY;  Surgeon: [name], MD;  Location: [facility];  Service: Orthopedics;  Laterality: Right;    FAMILY HISTORY: Family History  Problem Relation Age of Onset  . Liver cancer Mother   . Heart disease Father   . Breast cancer Sister 17  . Colon cancer Neg Hx   . Pancreatic cancer Neg Hx   . Rectal cancer Neg Hx   .  Stomach cancer Neg Hx    The patient's father died at the age of 4 from a myocardial infarction. The patient's mother died at the age of 40 from primary liver carcinoma. The patient had no brothers. Her one sister, Maria [name], has a history of breast cancer   GYNECOLOGIC HISTORY: Menarche at around age 35. The patient is GX P0. 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 She lives by herself. She had two cats,  Maria [name], but they are currently with her sister and Maria [name] thinks they would be more at home there. She attends a Investment [employer], operational.               ADVANCED DIRECTIVES: Completed 02/03/2019. She has named her niece her 65.   HEALTH MAINTENANCE: Social History   Socioeconomic History  . Marital status: Single    Spouse name: Not on file  . Number of children: Not on file  . Years of education: Not on file  . Highest education level: Not on file  Occupational History  . Not on file  Social Needs  . Financial resource strain: Not hard at all  . Food insecurity    Worry: Never true    Inability: Never true  . Transportation needs    Medical: No    Non-medical: No  Tobacco Use  . Smoking status: Never Smoker  . Smokeless tobacco: Never Used  Substance and Sexual Activity  . Alcohol use: Yes    Comment: occasional alcohol intake; once or twice monthly  . Drug use: No  . Sexual activity: Not on file  Lifestyle  . Physical activity    Days per week: 0 days    Minutes per session: 0 min  . Stress: Not at all  Relationships  . Social Herbalist on phone: Once a week    Gets together: Once a week    Attends religious service: Never    Active member of club or organization: No    Attends meetings of clubs or organizations: Never    Relationship status: Never married  . Intimate partner violence    Fear of current or ex partner: No    Emotionally abused: No    Physically abused: No    Forced sexual activity: No  Other Topics Concern  . Not on file  Social History Narrative  . Not on file                Colonoscopy: 06/01/2014/[facility]             PAP:             Bone density:             Lipid panel:  228/114/50/155/ratio 4.6  On 10/01/2016  ALLERGIES: Allergies  Allergen Reactions  . Iohexol Swelling and Rash  . Compazine [Prochlorperazine Edisylate] Other (See Comments)    Mild confusion  . Contrast Media  [Iodinated Diagnostic  Agents]   . Lactose Intolerance (Gi)     Diarrhea, gas bloating  . Cephalexin Rash    CURRENT MEDICATIONS: Current Outpatient Medications  Medication Sig Dispense Refill  . acetaminophen (TYLENOL 8 HOUR ARTHRITIS PAIN) 650 MG CR tablet Take 650 mg by mouth every 8 (eight) hours as needed for pain.    Marland Kitchen acyclovir (ZOVIRAX) 200  MG capsule TAKE 1 CAPSULE (200 MG TOTAL) BY MOUTH 3 (THREE) TIMES DAILY. 270 capsule 1  . allopurinol (ZYLOPRIM) 300 MG tablet TAKE 1 TABLET BY MOUTH EVERY DAY 90 tablet 1  . enoxaparin (LOVENOX) 30 MG/0.3ML injection Inject 0.9 mLs (90 mg total) into the skin daily. 30 mL 4  . furosemide (LASIX) 20 MG tablet TAKE 1 TABLET BY MOUTH EVERY DAY 90 tablet 1  . hydrocortisone (ANUSOL-HC) 2.5 % rectal cream Place rectally 3 (three) times daily. 30 g 0  . KLOR-CON M20 20 MEQ tablet TAKE 1 TABLET BY MOUTH EVERY DAY 30 tablet 0  . lidocaine-prilocaine (EMLA) cream Apply to affected area once 30 g 3  . ondansetron (ZOFRAN) 8 MG tablet Take 0.5-1 tablets (4-8 mg total) by mouth every 8 (eight) hours as needed for nausea or vomiting. 30 tablet 0  . rosuvastatin (CRESTOR) 10 MG tablet Take 10 mg by mouth at bedtime.  11  . SYNTHROID 88 MCG tablet Take 88 mcg by mouth daily before breakfast.     . venetoclax (VENCLEXTA STARTING PACK) 10 & 50 & 100 MG TBPK Week1: Take 20mg  once daily. [id number]: Take 50mg  once daily. [id number]: Take 100mg  once daily. [id number]: Take 200mg  once daily. Take w food & water 42 each 0   No current facility-administered medications for this visit.     OBJECTIVE: Middle-aged white woman who appears stated age 68:   06/18/19 0852  BP: (!) 137/54  [name]: 69  Resp: 18  Temp: 98.2 F (36.8 C)  SpO2: 100%   Filed Weights   06/18/19 0852  Weight: 177 lb 14.4 oz (80.7 kg)   Body mass index is 32.54 kg/m.  Sclerae unicteric, EOMs intact Wearing a mask She still has bilateral cervical and bilateral axillary adenopathy, essentially unchanged from  baseline. Lungs no rales or rhonchi Heart regular rate and rhythm Abd soft, nontender, positive bowel sounds MSK no focal spinal tenderness, no upper extremity lymphedema Neuro: nonfocal, well oriented, appropriate affect Breasts: Deferred   LAB RESULTS: Lab Results  Component Value Date   WBC 4.1 06/18/2019   NEUTROABS 2.3 06/18/2019   HGB 11.2 (L) 06/18/2019   HCT 34.2 (L) 06/18/2019   MCV 93.2 06/18/2019   PLT 142 (L) 06/18/2019   CMP Latest Ref Rng & Units 06/12/2019 06/11/2019 05/19/2019  Glucose 70 - 99 mg/dL 104(H) 105(H) 128(H)  BUN 8 - 23 mg/dL 16 15 16   Creatinine 0.44 - 1.00 mg/dL 0.76 0.78 0.91  Sodium 135 - 145 mmol/L 139 138 140  Potassium 3.5 - 5.1 mmol/L 4.0 3.9 4.0  Chloride 98 - 111 mmol/L 108 107 109  CO2 22 - 32 mmol/L 23 24 24   Calcium 8.9 - 10.3 mg/dL 8.8(L) 8.8(L) 9.0  Total Protein 6.5 - 8.1 g/dL 6.0(L) 6.0(L) 5.9(L)  Total Bilirubin 0.3 - 1.2 mg/dL 0.4 0.3 0.4  Alkaline Phos 38 - 126 U/L 110 111 70  AST 15 - 41 U/L 30 24 14(L)  ALT 0 - 44 U/L 11 13 6     No results for input(s): LABCA2 in the last 72 hours.   STUDIES: No results found.   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             (b) rituximab added to ibrutinib, first dose 08/27/2018             (c) hepatitis B studies 08/27/2018 negative             (f) ibrutinib/rituximab discontinued after 10/02/2018 dose w/o obvious response  (6) bendamustine/rituximab started 10/22/2018, discontinued after 1 cycle with rapid progression  (7) obinutuzumab/Gaziva started 11/11/2018  (a) second dose given 11/19/2018 together with chemotherapy  (b) third dose given 12/26/2018 and continuing every 3 weeks  (8) CH[O]P chemotherapy started 11/19/2018, completed 8 doses 05/19/2019             (a) echocardiogram on 11/07/2018 shows EF of 55-60%  (b) second cycle of CH[O]P postponed because of pandemic, given 12/26/2018, greatly reduced doses  (c) cycle 3 of CH[O]P/obinutuzumab given 02/03/2019 at increased doses  (d) cycle 4 and 5 of CH[O]P given on 6/16 and 7/7 at increased doses of 35mg /m2 and 550mg /m2  (e) Repeat echo on 04/22/2019 shows an EF of 60-65%  (9) severe immunocompromise: Marked hypogammaglobulinemia  (a) starting March 2020 she receives IVIG every 8 weeks   (10) 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  (a) LMWH started 04/23/2019  (b) 75% dose reduction September 2020 secondary to bruising  (11) started venetoclax ramp-up 06/11/2019 at 20 mg/d  (a) increased to 50 mg/d on 06/18/2019  PLAN:  Harla tolerated her baseline dose of venetoclax without any significant side effects.  She increased the dose to 50 mg today.  She understands that this is probably the critical week in terms of possible complications.  She will receive IV fluids today and have repeat labs this afternoon.  She will return tomorrow for labs and assuming the labs tomorrow are fine and she is not having any problems she may be able to avoid fluids tomorrow  which will make her life a little bit easier.  She is already scheduled to return a week from today, on the first day of her 100 mg/day dose  There has been no unusual bleeding from her Lovenox,.  She tolerated her IVIG well last week  She knows to call us for any issue that may develop before the repeat visit here. [name] Dad. Josephene Marrone, MD 06/18/2019   [facility] [address], [place] [address] Tel. [phone]    Fax. [phone]   I, Wilburn Mylar, am acting as scribe for Dr. [name] Dad. Josseline Reddin.  I, Lurline Del MD, have reviewed the above documentation for accuracy and completeness, and I agree with the above.

## 2019-06-18 ENCOUNTER — Other Ambulatory Visit: Payer: Self-pay | Admitting: Adult Health

## 2019-06-18 ENCOUNTER — Other Ambulatory Visit: Payer: Self-pay

## 2019-06-18 ENCOUNTER — Inpatient Hospital Stay: Payer: Medicare Other

## 2019-06-18 ENCOUNTER — Inpatient Hospital Stay (HOSPITAL_BASED_OUTPATIENT_CLINIC_OR_DEPARTMENT_OTHER): Payer: Medicare Other | Admitting: Oncology

## 2019-06-18 ENCOUNTER — Other Ambulatory Visit: Payer: Medicare Other

## 2019-06-18 VITALS — BP 140/64 | HR 66 | Temp 98.0°F | Resp 16

## 2019-06-18 VITALS — BP 137/54 | HR 69 | Temp 98.2°F | Resp 18 | Ht 62.0 in | Wt 177.9 lb

## 2019-06-18 DIAGNOSIS — C9110 Chronic lymphocytic leukemia of B-cell type not having achieved remission: Secondary | ICD-10-CM

## 2019-06-18 DIAGNOSIS — Z95828 Presence of other vascular implants and grafts: Secondary | ICD-10-CM

## 2019-06-18 DIAGNOSIS — D696 Thrombocytopenia, unspecified: Secondary | ICD-10-CM | POA: Diagnosis not present

## 2019-06-18 DIAGNOSIS — Z5112 Encounter for antineoplastic immunotherapy: Secondary | ICD-10-CM | POA: Diagnosis not present

## 2019-06-18 DIAGNOSIS — E039 Hypothyroidism, unspecified: Secondary | ICD-10-CM | POA: Diagnosis not present

## 2019-06-18 DIAGNOSIS — D801 Nonfamilial hypogammaglobulinemia: Secondary | ICD-10-CM

## 2019-06-18 LAB — CBC WITH DIFFERENTIAL/PLATELET
Abs Immature Granulocytes: 0.01 10*3/uL (ref 0.00–0.07)
Basophils Absolute: 0 10*3/uL (ref 0.0–0.1)
Basophils Relative: 1 %
Eosinophils Absolute: 0.2 10*3/uL (ref 0.0–0.5)
Eosinophils Relative: 4 %
HCT: 34.2 % — ABNORMAL LOW (ref 36.0–46.0)
Hemoglobin: 11.2 g/dL — ABNORMAL LOW (ref 12.0–15.0)
Immature Granulocytes: 0 %
Lymphocytes Relative: 30 %
Lymphs Abs: 1.2 10*3/uL (ref 0.7–4.0)
MCH: 30.5 pg (ref 26.0–34.0)
MCHC: 32.7 g/dL (ref 30.0–36.0)
MCV: 93.2 fL (ref 80.0–100.0)
Monocytes Absolute: 0.3 10*3/uL (ref 0.1–1.0)
Monocytes Relative: 8 %
Neutro Abs: 2.3 10*3/uL (ref 1.7–7.7)
Neutrophils Relative %: 57 %
Platelets: 142 10*3/uL — ABNORMAL LOW (ref 150–400)
RBC: 3.67 MIL/uL — ABNORMAL LOW (ref 3.87–5.11)
RDW: 14 % (ref 11.5–15.5)
WBC: 4.1 10*3/uL (ref 4.0–10.5)
nRBC: 0 % (ref 0.0–0.2)

## 2019-06-18 LAB — COMPREHENSIVE METABOLIC PANEL
ALT: 9 U/L (ref 0–44)
ALT: 9 U/L (ref 0–44)
AST: 16 U/L (ref 15–41)
AST: 14 U/L — ABNORMAL LOW (ref 15–41)
Albumin: 3.8 g/dL (ref 3.5–5.0)
Albumin: 3.4 g/dL — ABNORMAL LOW (ref 3.5–5.0)
Alkaline Phosphatase: 96 U/L (ref 38–126)
Alkaline Phosphatase: 85 U/L (ref 38–126)
Anion gap: 10 (ref 5–15)
Anion gap: 8 (ref 5–15)
BUN: 16 mg/dL (ref 8–23)
BUN: 17 mg/dL (ref 8–23)
CO2: 23 mmol/L (ref 22–32)
CO2: 23 mmol/L (ref 22–32)
Calcium: 9.2 mg/dL (ref 8.9–10.3)
Calcium: 8.8 mg/dL — ABNORMAL LOW (ref 8.9–10.3)
Chloride: 105 mmol/L (ref 98–111)
Chloride: 104 mmol/L (ref 98–111)
Creatinine, Ser: 0.84 mg/dL (ref 0.44–1.00)
Creatinine, Ser: 0.93 mg/dL (ref 0.44–1.00)
GFR calc Af Amer: >60 mL/min (ref >60)
GFR calc Af Amer: >60 mL/min (ref >60)
GFR calc non Af Amer: >60 mL/min (ref >60)
GFR calc non Af Amer: >60 mL/min (ref >60)
Glucose, Bld: 106 mg/dL — ABNORMAL HIGH (ref 70–99)
Glucose, Bld: 197 mg/dL — ABNORMAL HIGH (ref 70–99)
Potassium: 4.1 mmol/L (ref 3.5–5.1)
Potassium: 4.7 mmol/L (ref 3.5–5.1)
Sodium: 138 mmol/L (ref 135–145)
Sodium: 135 mmol/L (ref 135–145)
Total Bilirubin: 0.4 mg/dL (ref 0.3–1.2)
Total Bilirubin: 0.3 mg/dL (ref 0.3–1.2)
Total Protein: 6.1 g/dL — ABNORMAL LOW (ref 6.5–8.1)
Total Protein: 7.7 g/dL (ref 6.5–8.1)

## 2019-06-18 LAB — URIC ACID
Uric Acid, Serum: 4.7 mg/dL (ref 2.5–7.1)
Uric Acid, Serum: 4.6 mg/dL (ref 2.5–7.1)

## 2019-06-18 LAB — LACTATE DEHYDROGENASE
LDH: 256 U/L — ABNORMAL HIGH (ref 98–192)
LDH: 317 U/L — ABNORMAL HIGH (ref 98–192)

## 2019-06-18 LAB — PHOSPHORUS
Phosphorus: 3.8 mg/dL (ref 2.5–4.6)
Phosphorus: 4.4 mg/dL (ref 2.5–4.6)

## 2019-06-18 MED ORDER — DEXTROSE 5 % IV SOLN
INTRAVENOUS | Status: DC
  Administered 2019-06-18: 11:00:00 via INTRAVENOUS
  Filled 2019-06-18: qty 250

## 2019-06-18 MED ORDER — SODIUM CHLORIDE 0.9 % IV SOLN
INTRAVENOUS | Status: DC
  Administered 2019-06-18: 10:00:00 via INTRAVENOUS
  Filled 2019-06-18: qty 250

## 2019-06-18 MED ORDER — ACETAMINOPHEN 325 MG PO TABS
650.0000 mg | ORAL_TABLET | Freq: Once | ORAL | Status: AC
  Administered 2019-06-18: 10:00:00 650 mg via ORAL

## 2019-06-18 MED ORDER — DEXAMETHASONE SODIUM PHOSPHATE 10 MG/ML IJ SOLN
INTRAMUSCULAR | Status: AC
  Filled 2019-06-18: qty 1

## 2019-06-18 MED ORDER — DIPHENHYDRAMINE HCL 25 MG PO CAPS
ORAL_CAPSULE | ORAL | Status: AC
  Filled 2019-06-18: qty 1

## 2019-06-18 MED ORDER — HEPARIN SOD (PORK) LOCK FLUSH 100 UNIT/ML IV SOLN
500.0000 [IU] | Freq: Once | INTRAVENOUS | Status: DC
  Filled 2019-06-18: qty 5

## 2019-06-18 MED ORDER — IMMUNE GLOBULIN (HUMAN) 10 GM/100ML IV SOLN
1.0000 g/kg | Freq: Once | INTRAVENOUS | Status: AC
  Administered 2019-06-18: 80 g via INTRAVENOUS
  Filled 2019-06-18: qty 800

## 2019-06-18 MED ORDER — ACETAMINOPHEN 325 MG PO TABS
ORAL_TABLET | ORAL | Status: AC
  Filled 2019-06-18: qty 2

## 2019-06-18 MED ORDER — DIPHENHYDRAMINE HCL 50 MG/ML IJ SOLN
INTRAMUSCULAR | Status: AC
  Filled 2019-06-18: qty 1

## 2019-06-18 MED ORDER — SODIUM CHLORIDE 0.9% FLUSH
10.0000 mL | Freq: Once | INTRAVENOUS | Status: DC
  Filled 2019-06-18: qty 10

## 2019-06-18 MED ORDER — SODIUM CHLORIDE 0.9% FLUSH
10.0000 mL | Freq: Once | INTRAVENOUS | Status: AC
  Administered 2019-06-18: 10 mL
  Filled 2019-06-18: qty 10

## 2019-06-18 MED ORDER — DIPHENHYDRAMINE HCL 25 MG PO CAPS
25.0000 mg | ORAL_CAPSULE | Freq: Once | ORAL | Status: AC
  Administered 2019-06-18: 10:00:00 25 mg via ORAL

## 2019-06-19 ENCOUNTER — Other Ambulatory Visit: Payer: Self-pay

## 2019-06-19 ENCOUNTER — Telehealth: Payer: Self-pay | Admitting: *Deleted

## 2019-06-19 ENCOUNTER — Inpatient Hospital Stay: Payer: Medicare Other

## 2019-06-19 ENCOUNTER — Other Ambulatory Visit: Payer: Self-pay | Admitting: *Deleted

## 2019-06-19 VITALS — BP 147/69 | HR 72 | Temp 97.8°F | Resp 18

## 2019-06-19 DIAGNOSIS — C9110 Chronic lymphocytic leukemia of B-cell type not having achieved remission: Secondary | ICD-10-CM

## 2019-06-19 DIAGNOSIS — Z5112 Encounter for antineoplastic immunotherapy: Secondary | ICD-10-CM | POA: Diagnosis not present

## 2019-06-19 DIAGNOSIS — Z95828 Presence of other vascular implants and grafts: Secondary | ICD-10-CM

## 2019-06-19 DIAGNOSIS — D801 Nonfamilial hypogammaglobulinemia: Secondary | ICD-10-CM

## 2019-06-19 LAB — CBC WITH DIFFERENTIAL (CANCER CENTER ONLY)
Abs Immature Granulocytes: 0.01 10*3/uL (ref 0.00–0.07)
Basophils Absolute: 0 10*3/uL (ref 0.0–0.1)
Basophils Relative: 0 %
Eosinophils Absolute: 0 10*3/uL (ref 0.0–0.5)
Eosinophils Relative: 0 %
HCT: 31.6 % — ABNORMAL LOW (ref 36.0–46.0)
Hemoglobin: 10.3 g/dL — ABNORMAL LOW (ref 12.0–15.0)
Immature Granulocytes: 0 %
Lymphocytes Relative: 26 %
Lymphs Abs: 1.3 10*3/uL (ref 0.7–4.0)
MCH: 30.6 pg (ref 26.0–34.0)
MCHC: 32.6 g/dL (ref 30.0–36.0)
MCV: 93.8 fL (ref 80.0–100.0)
Monocytes Absolute: 0.6 10*3/uL (ref 0.1–1.0)
Monocytes Relative: 11 %
Neutro Abs: 3 10*3/uL (ref 1.7–7.7)
Neutrophils Relative %: 63 %
Platelet Count: 136 10*3/uL — ABNORMAL LOW (ref 150–400)
RBC: 3.37 MIL/uL — ABNORMAL LOW (ref 3.87–5.11)
RDW: 14.1 % (ref 11.5–15.5)
WBC Count: 4.9 10*3/uL (ref 4.0–10.5)
nRBC: 0 % (ref 0.0–0.2)

## 2019-06-19 LAB — COMPREHENSIVE METABOLIC PANEL
ALT: 8 U/L (ref 0–44)
AST: 14 U/L — ABNORMAL LOW (ref 15–41)
Albumin: 3.5 g/dL (ref 3.5–5.0)
Alkaline Phosphatase: 78 U/L (ref 38–126)
Anion gap: 10 (ref 5–15)
BUN: 25 mg/dL — ABNORMAL HIGH (ref 8–23)
CO2: 22 mmol/L (ref 22–32)
Calcium: 9 mg/dL (ref 8.9–10.3)
Chloride: 108 mmol/L (ref 98–111)
Creatinine, Ser: 0.86 mg/dL (ref 0.44–1.00)
GFR calc Af Amer: >60 mL/min (ref >60)
GFR calc non Af Amer: >60 mL/min (ref >60)
Glucose, Bld: 118 mg/dL — ABNORMAL HIGH (ref 70–99)
Potassium: 4 mmol/L (ref 3.5–5.1)
Sodium: 140 mmol/L (ref 135–145)
Total Bilirubin: 0.3 mg/dL (ref 0.3–1.2)
Total Protein: 7.1 g/dL (ref 6.5–8.1)

## 2019-06-19 LAB — LACTATE DEHYDROGENASE: LDH: 230 U/L — ABNORMAL HIGH (ref 98–192)

## 2019-06-19 LAB — IGG, IGA, IGM
IgA: 20 mg/dL — ABNORMAL LOW (ref 87–352)
IgG (Immunoglobin G), Serum: 486 mg/dL — ABNORMAL LOW (ref 586–1602)
IgM (Immunoglobulin M), Srm: <5 mg/dL — ABNORMAL LOW (ref 26–217)

## 2019-06-19 LAB — URIC ACID: Uric Acid, Serum: 4.3 mg/dL (ref 2.5–7.1)

## 2019-06-19 LAB — PHOSPHORUS: Phosphorus: 4.7 mg/dL — ABNORMAL HIGH (ref 2.5–4.6)

## 2019-06-19 MED ORDER — SODIUM CHLORIDE 0.9% FLUSH
10.0000 mL | Freq: Once | INTRAVENOUS | Status: AC
  Administered 2019-06-19: 10 mL
  Filled 2019-06-19: qty 10

## 2019-06-19 MED ORDER — SODIUM CHLORIDE 0.9% FLUSH
10.0000 mL | Freq: Once | INTRAVENOUS | Status: AC
  Administered 2019-06-19: 10 mL via INTRAVENOUS
  Filled 2019-06-19: qty 10

## 2019-06-19 MED ORDER — HEPARIN SOD (PORK) LOCK FLUSH 100 UNIT/ML IV SOLN
500.0000 [IU] | Freq: Once | INTRAVENOUS | Status: AC
  Administered 2019-06-19: 500 [IU] via INTRAVENOUS
  Filled 2019-06-19: qty 5

## 2019-06-19 MED ORDER — SODIUM CHLORIDE 0.9 % IV SOLN
INTRAVENOUS | Status: AC
  Administered 2019-06-19: 11:00:00 via INTRAVENOUS
  Filled 2019-06-19 (×2): qty 250

## 2019-06-19 NOTE — Telephone Encounter (Signed)
Per MD review of labs with mild elevations- recommended that pt does not need recheck for later today.   Pt advised to drink 2 liters of fluid daily - treatment room discussed and offered for pt to be scheduled for additional days for hydration by IV - pt stated she will be compliant in her hydration expectations per Ventoclax recommendations.  Pt is to call if she feels she is unable to hydrate well before next schedule lab on 10/15 and Ventoclax dosing increase.  Above communicated to treatment room nurse who will review with pt.

## 2019-06-19 NOTE — Patient Instructions (Signed)
Rehydration, Adult Rehydration is the replacement of body fluids and salts and minerals (electrolytes) that are lost during dehydration. Dehydration is when there is not enough fluid or water in the body. This happens when you lose more fluids than you take in. Common causes of dehydration include:  Vomiting.  Diarrhea.  Excessive sweating, such as from heat exposure or exercise.  Taking medicines that cause the body to lose excess fluid (diuretics).  Impaired kidney function.  Not drinking enough fluid.  Certain illnesses or infections.  Certain poorly controlled long-term (chronic) illnesses, such as diabetes, heart disease, and kidney disease.  Symptoms of mild dehydration may include thirst, dry lips and mouth, dry skin, and dizziness. Symptoms of severe dehydration may include increased heart rate, confusion, fainting, and not urinating. You can rehydrate by drinking certain fluids or getting fluids through an IV tube, as told by your health care provider. What are the risks? Generally, rehydration is safe. However, one problem that can happen is taking in too much fluid (overhydration). This is rare. If overhydration happens, it can cause an electrolyte imbalance, kidney failure, or a decrease in salt (sodium) levels in the body. How to rehydrate Follow instructions from your health care provider for rehydration. The kind of fluid you should drink and the amount you should drink depend on your condition.  If directed by your health care provider, drink an oral rehydration solution (ORS). This is a drink designed to treat dehydration that is found in pharmacies and retail stores. ? Make an ORS by following instructions on the package. ? Start by drinking small amounts, about  cup (120 mL) every 5-10 minutes. ? Slowly increase how much you drink until you have taken the amount recommended by your health care provider.  Drink enough clear fluids to keep your urine clear or pale  yellow. If you were instructed to drink an ORS, finish the ORS first, then start slowly drinking other clear fluids. Drink fluids such as: ? Water. Do not drink only water. Doing that can lead to having too little sodium in your body (hyponatremia). ? Ice chips. ? Fruit juice that you have added water to (diluted juice). ? Low-calorie sports drinks.  If you are severely dehydrated, your health care provider may recommend that you receive fluids through an IV tube in the hospital.  Do not take sodium tablets. Doing that can lead to the condition of having too much sodium in your body (hypernatremia). Eating while you rehydrate Follow instructions from your health care provider about what to eat while you rehydrate. Your health care provider may recommend that you slowly begin eating regular foods in small amounts.  Eat foods that contain a healthy balance of electrolytes, such as bananas, oranges, potatoes, tomatoes, and spinach.  Avoid foods that are greasy or contain a lot of fat or sugar.  In some cases, you may get nutrition through a feeding tube that is passed through your nose and into your stomach (nasogastric tube, or NG tube). This may be done if you have uncontrolled vomiting or diarrhea. Beverages to avoid Certain beverages may make dehydration worse. While you rehydrate, avoid:  Alcohol.  Caffeine.  Drinks that contain a lot of sugar. These include: ? High-calorie sports drinks. ? Fruit juice that is not diluted. ? Soda.  Check nutrition labels to see how much sugar or caffeine a beverage contains. Signs of dehydration recovery You may be recovering from dehydration if:  You are urinating more often than before you started   rehydrating.  Your urine is clear or pale yellow.  Your energy level improves.  You vomit less frequently.  You have diarrhea less frequently.  Your appetite improves or returns to normal.  You feel less dizzy or less light-headed.  Your  skin tone and color start to look more normal. Contact a health care provider if:  You continue to have symptoms of mild dehydration, such as: ? Thirst. ? Dry lips. ? Slightly dry mouth. ? Dry, warm skin. ? Dizziness.  You continue to vomit or have diarrhea. Get help right away if:  You have symptoms of dehydration that get worse.  You feel: ? Confused. ? Weak. ? Like you are going to faint.  You have not urinated in 6-8 hours.  You have very dark urine.  You have trouble breathing.  Your heart rate while sitting still is over 100 beats a minute.  You cannot drink fluids without vomiting.  You have vomiting or diarrhea that: ? Gets worse. ? Does not go away.  You have a fever. This information is not intended to replace advice given to you by your health care provider. Make sure you discuss any questions you have with your health care provider. Document Released: 11/19/2011 Document Revised: 08/09/2017 Document Reviewed: 10/21/2015 Elsevier Patient Education  2020 Elsevier Inc.  Coronavirus (COVID-19) Are you at risk?  Are you at risk for the Coronavirus (COVID-19)?  To be considered HIGH RISK for Coronavirus (COVID-19), you have to meet the following criteria:  . Traveled to [place], [place], [place], [place] or [place]; or in the [place] to [place], [place], [place], or [place]; and have fever, cough, and shortness of breath within the last 2 weeks of travel OR . Been in close contact with a person diagnosed with COVID-19 within the last 2 weeks and have fever, cough, and shortness of breath . IF YOU DO NOT MEET THESE CRITERIA, YOU ARE CONSIDERED LOW RISK FOR COVID-19.  What to do if you are HIGH RISK for COVID-19?  . If you are having a medical emergency, call 911. . Seek medical care right away. Before you go to a doctor's office, urgent care or emergency department, call ahead and tell them about your recent travel, contact with someone diagnosed  with COVID-19, and your symptoms. You should receive instructions from your physician's office regarding next steps of care.  . When you arrive at healthcare provider, tell the healthcare staff immediately you have returned from visiting [place], [place], [place], [place] or [place]; or traveled in the [place] to [place], [place], [place], or [place]; in the last two weeks or you have been in close contact with a person diagnosed with COVID-19 in the last 2 weeks.   . Tell the health care staff about your symptoms: fever, cough and shortness of breath. . After you have been seen by a medical provider, you will be either: o Tested for (COVID-19) and discharged home on quarantine except to seek medical care if symptoms worsen, and asked to  - Stay home and avoid contact with others until you get your results (4-5 days)  - Avoid travel on public transportation if possible (such as bus, train, or airplane) or o Sent to the Emergency Department by EMS for evaluation, COVID-19 testing, and possible admission depending on your condition and test results.  What to do if you are LOW RISK for COVID-19?  Reduce your risk of any infection by using the same precautions   used for avoiding the common cold or flu:  . Wash your hands often with soap and warm water for at least 20 seconds.  If soap and water are not readily available, use an alcohol-based hand sanitizer with at least 60% alcohol.  . If coughing or sneezing, cover your mouth and nose by coughing or sneezing into the elbow areas of your shirt or coat, into a tissue or into your sleeve (not your hands). . Avoid shaking hands with others and consider head nods or verbal greetings only. . Avoid touching your eyes, nose, or mouth with unwashed hands.  . Avoid close contact with people who are sick. . Avoid places or events with large numbers of people in one location, like concerts or sporting events. . Carefully consider travel plans you have  or are making. . If you are planning any travel outside or inside the [place], visit the CDC's Travelers' Health webpage for the latest health notices. . If you have some symptoms but not all symptoms, continue to monitor at home and seek medical attention if your symptoms worsen. . If you are having a medical emergency, call 911.   ADDITIONAL HEALTHCARE OPTIONS FOR PATIENTS  [facility] / e-Visit: [email or web address]         [facility]: [phone]  [facility]: [phone]                   [facility]: [phone]   

## 2019-06-22 ENCOUNTER — Telehealth: Payer: Self-pay | Admitting: Oncology

## 2019-06-22 NOTE — Telephone Encounter (Signed)
Called patient regarding rescheduling 10/16 per 10/12 scheduling message, patient confirmed and is notified of times.

## 2019-06-25 ENCOUNTER — Other Ambulatory Visit: Payer: Self-pay

## 2019-06-25 ENCOUNTER — Inpatient Hospital Stay: Payer: Medicare Other

## 2019-06-25 ENCOUNTER — Inpatient Hospital Stay: Payer: Medicare Other | Admitting: Oncology

## 2019-06-25 VITALS — BP 156/63 | HR 69 | Resp 17

## 2019-06-25 VITALS — BP 142/60 | HR 69 | Temp 97.8°F | Resp 70 | Ht 62.0 in | Wt 176.9 lb

## 2019-06-25 DIAGNOSIS — C9110 Chronic lymphocytic leukemia of B-cell type not having achieved remission: Secondary | ICD-10-CM

## 2019-06-25 DIAGNOSIS — Z5112 Encounter for antineoplastic immunotherapy: Secondary | ICD-10-CM | POA: Diagnosis not present

## 2019-06-25 DIAGNOSIS — D801 Nonfamilial hypogammaglobulinemia: Secondary | ICD-10-CM

## 2019-06-25 DIAGNOSIS — Z95828 Presence of other vascular implants and grafts: Secondary | ICD-10-CM

## 2019-06-25 LAB — CBC WITH DIFFERENTIAL/PLATELET
Abs Immature Granulocytes: 0.01 10*3/uL (ref 0.00–0.07)
Basophils Absolute: 0 10*3/uL (ref 0.0–0.1)
Basophils Relative: 1 %
Eosinophils Absolute: 0.2 10*3/uL (ref 0.0–0.5)
Eosinophils Relative: 3 %
HCT: 34.9 % — ABNORMAL LOW (ref 36.0–46.0)
Hemoglobin: 11.2 g/dL — ABNORMAL LOW (ref 12.0–15.0)
Immature Granulocytes: 0 %
Lymphocytes Relative: 30 %
Lymphs Abs: 1.4 10*3/uL (ref 0.7–4.0)
MCH: 30.4 pg (ref 26.0–34.0)
MCHC: 32.1 g/dL (ref 30.0–36.0)
MCV: 94.6 fL (ref 80.0–100.0)
Monocytes Absolute: 0.3 10*3/uL (ref 0.1–1.0)
Monocytes Relative: 7 %
Neutro Abs: 2.8 10*3/uL (ref 1.7–7.7)
Neutrophils Relative %: 59 %
Platelets: 128 10*3/uL — ABNORMAL LOW (ref 150–400)
RBC: 3.69 MIL/uL — ABNORMAL LOW (ref 3.87–5.11)
RDW: 13.8 % (ref 11.5–15.5)
WBC: 4.7 10*3/uL (ref 4.0–10.5)
nRBC: 0 % (ref 0.0–0.2)

## 2019-06-25 LAB — COMPREHENSIVE METABOLIC PANEL
ALT: 10 U/L (ref 0–44)
AST: 18 U/L (ref 15–41)
Albumin: 3.6 g/dL (ref 3.5–5.0)
Alkaline Phosphatase: 93 U/L (ref 38–126)
Anion gap: 8 (ref 5–15)
BUN: 17 mg/dL (ref 8–23)
CO2: 23 mmol/L (ref 22–32)
Calcium: 9.1 mg/dL (ref 8.9–10.3)
Chloride: 107 mmol/L (ref 98–111)
Creatinine, Ser: 0.76 mg/dL (ref 0.44–1.00)
GFR calc Af Amer: >60 mL/min (ref >60)
GFR calc non Af Amer: >60 mL/min (ref >60)
Glucose, Bld: 93 mg/dL (ref 70–99)
Potassium: 4.2 mmol/L (ref 3.5–5.1)
Sodium: 138 mmol/L (ref 135–145)
Total Bilirubin: 0.4 mg/dL (ref 0.3–1.2)
Total Protein: 6.7 g/dL (ref 6.5–8.1)

## 2019-06-25 LAB — URIC ACID: Uric Acid, Serum: 3.9 mg/dL (ref 2.5–7.1)

## 2019-06-25 LAB — LACTATE DEHYDROGENASE: LDH: 187 U/L (ref 98–192)

## 2019-06-25 MED ORDER — HEPARIN SOD (PORK) LOCK FLUSH 100 UNIT/ML IV SOLN
500.0000 [IU] | Freq: Once | INTRAVENOUS | Status: AC
  Administered 2019-06-25: 13:00:00 500 [IU]
  Filled 2019-06-25: qty 5

## 2019-06-25 MED ORDER — SODIUM CHLORIDE 0.9% FLUSH
10.0000 mL | Freq: Once | INTRAVENOUS | Status: AC
  Administered 2019-06-25: 10 mL
  Filled 2019-06-25: qty 10

## 2019-06-25 MED ORDER — SODIUM CHLORIDE 0.9 % IV SOLN
Freq: Once | INTRAVENOUS | Status: AC
  Administered 2019-06-25: 10:00:00 via INTRAVENOUS
  Filled 2019-06-25: qty 250

## 2019-06-25 NOTE — Patient Instructions (Signed)
Dehydration, Adult  Dehydration is when there is not enough fluid or water in your body. This happens when you lose more fluids than you take in. Dehydration can range from mild to very bad. It should be treated right away to keep it from getting very bad. Symptoms of mild dehydration may include:  Thirst.  Dry lips.  Slightly dry mouth.  Dry, warm skin.  Dizziness. Symptoms of moderate dehydration may include:  Very dry mouth.  Muscle cramps.  Dark pee (urine). Pee may be the color of tea.  Your body making less pee.  Your eyes making fewer tears.  Heartbeat that is uneven or faster than normal (palpitations).  Headache.  Light-headedness, especially when you stand up from sitting.  Fainting (syncope). Symptoms of very bad dehydration may include:  Changes in skin, such as: ? Cold and clammy skin. ? Blotchy (mottled) or pale skin. ? Skin that does not quickly return to normal after being lightly pinched and let go (poor skin turgor).  Changes in body fluids, such as: ? Feeling very thirsty. ? Your eyes making fewer tears. ? Not sweating when body temperature is high, such as in hot weather. ? Your body making very little pee.  Changes in vital signs, such as: ? Weak pulse. ? Pulse that is more than 100 beats a minute when you are sitting still. ? Fast breathing. ? Low blood pressure.  Other changes, such as: ? Sunken eyes. ? Cold hands and feet. ? Confusion. ? Lack of energy (lethargy). ? Trouble waking up from sleep. ? Short-term weight loss. ? Unconsciousness. Follow these instructions at home:   If told by your doctor, drink an ORS: ? Make an ORS by using instructions on the package. ? Start by drinking small amounts, about  cup (120 mL) every 5-10 minutes. ? Slowly drink more until you have had the amount that your doctor said to have.  Drink enough clear fluid to keep your pee clear or pale yellow. If you were told to drink an ORS, finish the  ORS first, then start slowly drinking clear fluids. Drink fluids such as: ? Water. Do not drink only water by itself. Doing that can make the salt (sodium) level in your body get too low (hyponatremia). ? Ice chips. ? Fruit juice that you have added water to (diluted). ? Low-calorie sports drinks.  Avoid: ? Alcohol. ? Drinks that have a lot of sugar. These include high-calorie sports drinks, fruit juice that does not have water added, and soda. ? Caffeine. ? Foods that are greasy or have a lot of fat or sugar.  Take over-the-counter and prescription medicines only as told by your doctor.  Do not take salt tablets. Doing that can make the salt level in your body get too high (hypernatremia).  Eat foods that have minerals (electrolytes). Examples include bananas, oranges, potatoes, tomatoes, and spinach.  Keep all follow-up visits as told by your doctor. This is important. Contact a doctor if:  You have belly (abdominal) pain that: ? Gets worse. ? Stays in one area (localizes).  You have a rash.  You have a stiff neck.  You get angry or annoyed more easily than normal (irritability).  You are more sleepy than normal.  You have a harder time waking up than normal.  You feel: ? Weak. ? Dizzy. ? Very thirsty.  You have peed (urinated) only a small amount of very dark pee during 6-8 hours. Get help right away if:  You have   symptoms of very bad dehydration.  You cannot drink fluids without throwing up (vomiting).  Your symptoms get worse with treatment.  You have a fever.  You have a very bad headache.  You are throwing up or having watery poop (diarrhea) and it: ? Gets worse. ? Does not go away.  You have blood or something green (bile) in your throw-up.  You have blood in your poop (stool). This may cause poop to look black and tarry.  You have not peed in 6-8 hours.  You pass out (faint).  Your heart rate when you are sitting still is more than 100 beats a  minute.  You have trouble breathing. This information is not intended to replace advice given to you by your health care provider. Make sure you discuss any questions you have with your health care provider. Document Released: 06/23/2009 Document Revised: 08/09/2017 Document Reviewed: 10/21/2015 Elsevier Patient Education  2020 [place].  Venetoclax oral tablets What is this medicine? VENETOCLAX (ven et oh klax) is a medicine that targets proteins in cancer cells and stops the cancer cells from growing. It is used to treat chronic lymphocytic leukemia, small lymphocytic lymphoma, and acute myelogenous leukemia. This medicine may be used for other purposes; ask your health care provider or pharmacist if you have questions. COMMON BRAND NAME(S): Venclexta What should I tell my health care provider before I take this medicine? They need to know if you have any of these conditions:  gout  high levels of uric acid in the blood  kidney disease  liver disease  low or high levels of potassium, phosphorus, or calcium in the blood  scheduled to receive a vaccine  an unusual or allergic reaction to venetoclax, other medicines, foods, dyes, or preservatives  pregnant or trying to get pregnant  breast-feeding How should I use this medicine? Take this medicine by mouth with food and a glass of water. Follow the directions on the prescription label. Do not cut, crush, or chew this medicine. Do not take with grapefruit juice or eat [place] oranges or starfruit. Take your medicine at regular intervals. Do not take it more often than directed. Do not stop taking except on your doctor's advice. A special MedGuide will be given to you by the pharmacist with each prescription and refill. Be sure to read this information carefully each time. Talk to your pediatrician regarding the use of this medicine in children. Special care may be needed. Overdosage: If you think you have taken too much of this  medicine contact a poison control center or emergency room at once. NOTE: This medicine is only for you. Do not share this medicine with others. What if I miss a dose? If you miss a dose, take it as soon as you can. If your next dose is to be taken in less than 16 hours, then do not take the missed dose. Take the next dose at your regular time. Do not take double or extra doses. If you vomit after a dose, do not take another dose; take the next day's dose at the usual time. What may interact with this medicine? This medicine may interact with the following medications:  bosentan  calcium channel blockers like diltiazem and verapamil  captopril  carvedilol  certain antibiotics like azithromycin, erythromycin, and clarithromycin  certain medicines for fungal infections like fluconazole, itraconazole, ketoconazole, posaconazole, and voriconazole  certain medicines for irregular heart beat like amiodarone, dronedarone, and quinidine  certain medicines for seizures like carbamazepine and  phenytoin  ciprofloxacin  conivaptan  cyclosporine  digoxin  efavirenz  etravirine  everolimus  felodipine  grapefruit products, [place] oranges, or starfruit  indinavir  live virus vaccines  lopinavir  modafinil  nafcillin  quercetin  ranolazine  rifampin  ritonavir  sirolimus  St. John's wort  telaprevir  ticagrelor  warfarin This list may not describe all possible interactions. Give your health care provider a list of all the medicines, herbs, non-prescription drugs, or dietary supplements you use. Also tell them if you smoke, drink alcohol, or use illegal drugs. Some items may interact with your medicine. What should I watch for while using this medicine? This medicine can cause serious reactions. To reduce your risk you will need to take other medicine(s) before treatment with this medicine. Take your medicine as directed. You may need blood work done while you  are taking this medicine. Drink plenty of fluids while you are taking this medicine. Call your doctor or health care professional for advice if you get a fever, chills or sore throat, or other symptoms of a cold or flu. Do not treat yourself. This drug decreases your body's ability to fight infections. Try to avoid being around people who are sick. Do not become pregnant while taking this medicine or for 30 days after stopping it. Women should inform their doctor if they wish to become pregnant or think they might be pregnant. There is a potential for serious side effects to an unborn child. Talk to your health care professional or pharmacist for more information. Do not breast-feed an infant while taking this medicine. This may interfere with the ability to father a child. You should talk to your doctor or health care professional if you are concerned about your fertility. What side effects may I notice from receiving this medicine? Side effects that you should report to your doctor or health care professional as soon as possible:  allergic reactions like skin rash, itching or hives, swelling of the face, lips, or tongue  low blood counts - this medicine may decrease the number of white blood cells, red blood cells and platelets. You may be at increased risk for infections and bleeding  signs and symptoms of infection like fever or chills; cough; sore throat; or pain when urinating  signs and symptoms of tumor lysis syndrome like nausea, vomiting, confusion, shortness of breath, seizures, irregular heartbeat, dark urine, tiredness, muscle pain, and/or joint pain Side effects that usually do not require medical attention (report to your doctor or health care professional if they continue or are bothersome):  back pain  constipation  diarrhea  dizziness  headache  stomach pain  swelling of the ankles, feet, hands This list may not describe all possible side effects. Call your doctor for  medical advice about side effects. You may report side effects to FDA at 1-800-FDA-1088. Where should I keep my medicine? Keep out of the reach of children. Store below 30 degrees C (86 degrees F). Keep this medicine in the original package during the first 4 weeks of treatment. Throw away any unused medicine after the expiration date. NOTE: This sheet is a summary. It may not cover all possible information. If you have questions about this medicine, talk to your doctor, pharmacist, or health care provider.  2020 Elsevier/Gold Standard (2018-04-10 15:07:49)

## 2019-06-25 NOTE — Progress Notes (Signed)
[name]  [phone] [phone]    ID: TRYSTA ULREY   DOB: 1951/05/31  MR#: [id number]  [id number]  Patient Care Team: Marton Redwood, MD as PCP - General (Internal Medicine) [name], MD as Consulting Physician (Orthopedic Surgery) [name] Godbee, [name] Dad, MD as Consulting Physician (Oncology) [name], MD as Consulting Physician (General Surgery) Causey, [name], NP as Nurse Practitioner (Hematology and Oncology) OTHER MD:  CHIEF COMPLAINT: Small lymphocytic lymphoma  CURRENT TREATMENT: venetoclax, obinutuzumab, IVIG   INTERVAL HISTORY: Angelea returns today for follow-up and treatment of her CLL. She was last seen here on 06/18/2019.   She continues on venetoclax ramp-up, starting the 100 mg dose today.  She is tolerating this remarkably well.  She has had no rash, no diarrhea, no cough phlegm production or pleurisy, no fatigue.  The lymph node in her upper right medial arm has pretty much resolved.  She also receives/obinutuzumab every 3 weeks.  Her next dose is due 07/03/2019.  She has no side effects from these treatments.  She also continues on IVIG which she receives whenever her total IgG drops near or below 400.  On 06/18/2019 her IgG was 486.  Since her last visit here, she has not undergone any additional studies. Her last PET scan was on 10/29/2018.    REVIEW OF SYSTEMS: Cashmere is planning a trip to [place] for Christmas.  We discussed that at length today.  She is otherwise yelling at the TV but already voted so she is planning to not blood so many news from this point.  She is not exercising regularly.  She takes occasional walks.  A detailed review of systems today was otherwise stable    HISTORY OF PRESENT ILLNESS: From the original intake note:  [name] developed what she thought was "the flu" in December of 2002. She noted a large lymph node developing in her right anterior cervical  area. 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   Her subsequent history is as detailed above.   PAST MEDICAL HISTORY: Past Medical History:  Diagnosis Date  . Allergy   . Arthritis   . Asthma   . Cancer [facility])    chronic lymphacytic leukemia  . CLL (chronic lymphocytic leukemia) ([place])   . Diabetes mellitus    PMH  . Headache    migraine  . Hyperlipidemia   . Hypertension   . Hypothyroidism   . Lymphoma ([place])   . Thyroid disease    hypothyroidism  . Wears glasses     PAST SURGICAL HISTORY: Past Surgical History:  Procedure Laterality Date  . ABDOMINAL HYSTERECTOMY    . AXILLARY LYMPH NODE BIOPSY Right 08/21/2018   Procedure: RIGHT AXILLARY LYMPH NODE BIOPSY ERAS PATHWAY;  Surgeon: [name], MD;  Location: [place];  Service: General;  Laterality: Right;  LMA  . BREAST EXCISIONAL BIOPSY Right   . COLONOSCOPY    . IR IMAGING GUIDED PORT INSERTION  11/10/2018  . LYMPH NODE DISSECTION    . STRABISMUS SURGERY    . TONSILLECTOMY    . TOTAL KNEE ARTHROPLASTY Right 12/03/2016   Procedure: RIGHT TOTAL KNEE ARTHROPLASTY;  Surgeon: [name], MD;  Location: [facility];  Service: Orthopedics;  Laterality: Right;    FAMILY HISTORY: Family History  Problem Relation Age of Onset  . Liver cancer Mother   . Heart disease Father   . Breast cancer Sister 75  .  Colon cancer Neg Hx   . Pancreatic cancer Neg Hx   . Rectal cancer Neg Hx   . Stomach cancer Neg Hx    The patient's father died at the age of 9 from a myocardial infarction. The patient's mother died at the age of 66 from primary liver carcinoma. The patient had no brothers. Her one sister, [name], has a history of breast cancer   GYNECOLOGIC HISTORY: Menarche at around age 30. The patient is GX P0. 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 She lives by herself. She had two cats, [name] and [place], but they are currently with her sister and Chitra thinks they would be more at home there. She attends a [employer], operational.               ADVANCED DIRECTIVES: Completed 02/03/2019. She has named her niece her 18.   HEALTH MAINTENANCE: Social History   Socioeconomic History  . Marital status: Single    Spouse name: Not on file  . Number of children: Not on file  . Years of education: Not on file  . Highest education level: Not on file  Occupational History  . Not on file  Social Needs  . Financial resource strain: Not hard at all  . Food insecurity    Worry: Never true    Inability: Never true  . Transportation needs    Medical: No    Non-medical: No  Tobacco Use  . Smoking status: Never Smoker  . Smokeless tobacco: Never Used  Substance and Sexual Activity  . Alcohol use: Yes    Comment: occasional alcohol intake; once or twice monthly  . Drug use: No  . Sexual activity: Not on file  Lifestyle  . Physical activity    Days per week: 0 days    Minutes per session: 0 min  . Stress: Not at all  Relationships  . Social Herbalist on phone: Once a week    Gets together: Once a week    Attends religious service: Never    Active member of club or organization: No    Attends meetings of clubs or organizations: Never    Relationship status: Never married  . Intimate partner violence    Fear of current or ex partner: No    Emotionally abused: No    Physically abused: No    Forced sexual activity: No  Other Topics Concern  . Not on file  Social History Narrative  . Not on file                Colonoscopy: 06/01/2014/[facility]             PAP:             Bone density:             Lipid panel:  228/114/50/155/ratio 4.6  On 10/01/2016  ALLERGIES: Allergies  Allergen Reactions  . Iohexol Swelling and  Rash  . Compazine [Prochlorperazine Edisylate] Other (See Comments)    Mild confusion  . Contrast Media  [Iodinated Diagnostic Agents]   . Lactose Intolerance (Gi)     Diarrhea, gas bloating  . Cephalexin Rash    CURRENT MEDICATIONS: Current Outpatient Medications  Medication Sig Dispense Refill  . acetaminophen (TYLENOL 8 HOUR ARTHRITIS PAIN) 650 MG CR  tablet Take 650 mg by mouth every 8 (eight) hours as needed for pain.    Marland Kitchen acyclovir (ZOVIRAX) 200 MG capsule TAKE 1 CAPSULE (200 MG TOTAL) BY MOUTH 3 (THREE) TIMES DAILY. 270 capsule 1  . allopurinol (ZYLOPRIM) 300 MG tablet TAKE 1 TABLET BY MOUTH EVERY DAY 90 tablet 1  . enoxaparin (LOVENOX) 30 MG/0.3ML injection Inject 0.9 mLs (90 mg total) into the skin daily. 30 mL 4  . furosemide (LASIX) 20 MG tablet TAKE 1 TABLET BY MOUTH EVERY DAY 90 tablet 1  . hydrocortisone (ANUSOL-HC) 2.5 % rectal cream Place rectally 3 (three) times daily. 30 g 0  . KLOR-CON M20 20 MEQ tablet TAKE 1 TABLET BY MOUTH EVERY DAY 30 tablet 0  . lidocaine-prilocaine (EMLA) cream Apply to affected area once 30 g 3  . ondansetron (ZOFRAN) 8 MG tablet Take 0.5-1 tablets (4-8 mg total) by mouth every 8 (eight) hours as needed for nausea or vomiting. 30 tablet 0  . rosuvastatin (CRESTOR) 10 MG tablet Take 10 mg by mouth at bedtime.  11  . SYNTHROID 88 MCG tablet Take 88 mcg by mouth daily before breakfast.     . venetoclax (VENCLEXTA STARTING PACK) 10 & 50 & 100 MG TBPK Week1: Take 20mg  once daily. [id number]: Take 50mg  once daily. [id number]: Take 100mg  once daily. [id number]: Take 200mg  once daily. Take w food & water 42 each 0   No current facility-administered medications for this visit.     OBJECTIVE: Middle-aged white woman in no acute distress  Vitals:   06/25/19 0928  BP: (!) 142/60  Pulse: 69  Resp: (!) 70  Temp: 97.8 F (36.6 C)  SpO2: 100%   Wt Readings from Last 3 Encounters:  06/25/19 176 lb 14.4 oz (80.2 kg)  06/18/19 177 lb 14.4 oz (80.7 kg)  06/11/19 177  lb 1.6 oz (80.3 kg)   Body mass index is 32.36 kg/m.    ECOG FS:1 - Symptomatic but completely ambulatory  Ocular: Sclerae unicteric, pupils round and equal Ear-nose-throat: Wearing a mask Lymphatic: She still has bilateral cervical and bilateral axillary adenopathy, right side more noticeable than left. Lungs no rales or rhonchi Heart regular rate and rhythm Abd soft, nontender, positive bowel sounds MSK no focal spinal tenderness, no joint edema Neuro: non-focal, well-oriented, appropriate affect Breasts: Deferred    LAB RESULTS: Lab Results  Component Value Date   WBC 4.9 06/19/2019   NEUTROABS 3.0 06/19/2019   HGB 10.3 (L) 06/19/2019   HCT 31.6 (L) 06/19/2019   MCV 93.8 06/19/2019   PLT 136 (L) 06/19/2019   CMP Latest Ref Rng & Units 06/19/2019 06/18/2019 06/18/2019  Glucose 70 - 99 mg/dL 118(H) 197(H) 106(H)  BUN 8 - 23 mg/dL 25(H) 17 16  Creatinine 0.44 - 1.00 mg/dL 0.86 0.93 0.84  Sodium 135 - 145 mmol/L 140 135 138  Potassium 3.5 - 5.1 mmol/L 4.0 4.7 4.1  Chloride 98 - 111 mmol/L 108 104 105  CO2 22 - 32 mmol/L 22 23 23   Calcium 8.9 - 10.3 mg/dL 9.0 8.8(L) 9.2  Total Protein 6.5 - 8.1 g/dL 7.1 7.7 6.1(L)  Total Bilirubin 0.3 - 1.2 mg/dL 0.3 0.3 0.4  Alkaline Phos 38 - 126 U/L 78 85 96  AST 15 - 41 U/L 14(L) 14(L) 16  ALT 0 - 44 U/L 8 9 9     No results for input(s): LABCA2 in the last 72 hours.   STUDIES: No results found.   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             (b) rituximab added to ibrutinib, first dose 08/27/2018             (c) hepatitis B studies 08/27/2018 negative             (f) ibrutinib/rituximab discontinued after 10/02/2018 dose w/o obvious response  (6) bendamustine/rituximab started 10/22/2018, discontinued after 1 cycle with rapid progression  (7) obinutuzumab/Gaziva started 11/11/2018  (a) second dose given 11/19/2018 together with chemotherapy  (b) third dose given 12/26/2018 and continuing every 3 weeks  (8) CH[O]P chemotherapy started 11/19/2018, completed 8 doses 05/19/2019             (a) echocardiogram on 11/07/2018 shows EF of 55-60%  (b) second cycle of CH[O]P postponed because of pandemic, given 12/26/2018, greatly reduced doses  (c) cycle 3 of CH[O]P/obinutuzumab given 02/03/2019 at increased doses  (d) cycle 4 and 5 of CH[O]P given on 6/16 and 7/7 at increased doses of 35mg /m2 and 550mg /m2  (e) Repeat echo on 04/22/2019 shows an EF of 60-65%  (9) severe immunocompromise: Marked hypogammaglobulinemia  (a) starting March 2020 she receives IVIG every 8 weeks   (10) 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  (a) LMWH started 04/23/2019  (b) 75% dose reduction September 2020 secondary to bruising  (11) started venetoclax ramp-up 06/11/2019 at 20 mg/d  (a) increased to 50 mg/d on 06/18/2019  PLAN:  Tersia is starting at the 100 mg dose of venetoclax today.  So far she is tolerating this quite well.  We will do labs and fluids today and labs tomorrow.  If  her labs are fine she may not need the fluids.  I have asked her not to leave tomorrow after labs drawn until she gets my nurse practitioner to look at them.  Otherwise she will return in a week at which point she will go on to full dose.  She knows to call for any other issue that may develop before the next visit.  Chasty Randal, [name] Dad, MD  06/25/19 9:51 AM Medical Oncology and Hematology [facility] [place], [place] [address] Tel. [phone]    Fax. [phone]  I, Jacqualyn Posey am acting as a Education administrator for Chauncey Cruel, MD.   I, Lurline Del MD, have reviewed the above documentation for accuracy and completeness, and I agree with the above.

## 2019-06-26 ENCOUNTER — Inpatient Hospital Stay: Payer: Medicare Other

## 2019-06-26 ENCOUNTER — Telehealth: Payer: Self-pay | Admitting: *Deleted

## 2019-06-26 ENCOUNTER — Other Ambulatory Visit: Payer: Medicare Other

## 2019-06-26 ENCOUNTER — Other Ambulatory Visit: Payer: Self-pay

## 2019-06-26 ENCOUNTER — Ambulatory Visit: Payer: Medicare Other

## 2019-06-26 DIAGNOSIS — C9110 Chronic lymphocytic leukemia of B-cell type not having achieved remission: Secondary | ICD-10-CM

## 2019-06-26 DIAGNOSIS — D801 Nonfamilial hypogammaglobulinemia: Secondary | ICD-10-CM

## 2019-06-26 DIAGNOSIS — Z95828 Presence of other vascular implants and grafts: Secondary | ICD-10-CM

## 2019-06-26 DIAGNOSIS — Z5112 Encounter for antineoplastic immunotherapy: Secondary | ICD-10-CM | POA: Diagnosis not present

## 2019-06-26 LAB — CBC WITH DIFFERENTIAL/PLATELET
Abs Immature Granulocytes: 0.02 10*3/uL (ref 0.00–0.07)
Basophils Absolute: 0 10*3/uL (ref 0.0–0.1)
Basophils Relative: 1 %
Eosinophils Absolute: 0.1 10*3/uL (ref 0.0–0.5)
Eosinophils Relative: 3 %
HCT: 34.9 % — ABNORMAL LOW (ref 36.0–46.0)
Hemoglobin: 11.2 g/dL — ABNORMAL LOW (ref 12.0–15.0)
Immature Granulocytes: 1 %
Lymphocytes Relative: 31 %
Lymphs Abs: 1.3 10*3/uL (ref 0.7–4.0)
MCH: 30.4 pg (ref 26.0–34.0)
MCHC: 32.1 g/dL (ref 30.0–36.0)
MCV: 94.8 fL (ref 80.0–100.0)
Monocytes Absolute: 0.3 10*3/uL (ref 0.1–1.0)
Monocytes Relative: 7 %
Neutro Abs: 2.3 10*3/uL (ref 1.7–7.7)
Neutrophils Relative %: 57 %
Platelets: 116 10*3/uL — ABNORMAL LOW (ref 150–400)
RBC: 3.68 MIL/uL — ABNORMAL LOW (ref 3.87–5.11)
RDW: 13.9 % (ref 11.5–15.5)
WBC: 4.1 10*3/uL (ref 4.0–10.5)
nRBC: 0 % (ref 0.0–0.2)

## 2019-06-26 LAB — COMPREHENSIVE METABOLIC PANEL
ALT: 10 U/L (ref 0–44)
AST: 19 U/L (ref 15–41)
Albumin: 3.6 g/dL (ref 3.5–5.0)
Alkaline Phosphatase: 95 U/L (ref 38–126)
Anion gap: 8 (ref 5–15)
BUN: 17 mg/dL (ref 8–23)
CO2: 23 mmol/L (ref 22–32)
Calcium: 9.2 mg/dL (ref 8.9–10.3)
Chloride: 108 mmol/L (ref 98–111)
Creatinine, Ser: 0.75 mg/dL (ref 0.44–1.00)
GFR calc Af Amer: >60 mL/min (ref >60)
GFR calc non Af Amer: >60 mL/min (ref >60)
Glucose, Bld: 90 mg/dL (ref 70–99)
Potassium: 4.3 mmol/L (ref 3.5–5.1)
Sodium: 139 mmol/L (ref 135–145)
Total Bilirubin: 0.4 mg/dL (ref 0.3–1.2)
Total Protein: 6.6 g/dL (ref 6.5–8.1)

## 2019-06-26 LAB — LACTATE DEHYDROGENASE: LDH: 181 U/L (ref 98–192)

## 2019-06-26 LAB — URIC ACID: Uric Acid, Serum: 3.8 mg/dL (ref 2.5–7.1)

## 2019-06-26 LAB — IGG, IGA, IGM
IgA: 21 mg/dL — ABNORMAL LOW (ref 87–352)
IgG (Immunoglobin G), Serum: 1294 mg/dL (ref 586–1602)
IgM (Immunoglobulin M), Srm: <5 mg/dL — ABNORMAL LOW (ref 26–217)

## 2019-06-26 LAB — PHOSPHORUS: Phosphorus: 4.4 mg/dL (ref 2.5–4.6)

## 2019-06-26 IMAGING — PT NM PET TUM IMG RESTAG (PS) SKULL BASE T - THIGH
1 of 8 series · 1 of 25 positions shown · non-contrast
Comparison: 07/23/2018

CLINICAL DATA: Subsequent treatment strategy for chronic
lymphocytic leukemia.

EXAM:
NUCLEAR MEDICINE PET SKULL BASE TO THIGH
TECHNIQUE: 9.9 mCi F-18 FDG was injected intravenously. Full-ring PET imaging
was performed from the skull base to thigh after the radiotracer. CT
data was obtained and used for attenuation correction and anatomic
localization.
Fasting blood glucose: 87 mg/dl

[Series 4: ct sk_thigh 5.0 b31f · axial · 5.0mm · 0.98mm/px · 1 of 227 slices shown]
[im 227/227  brain]
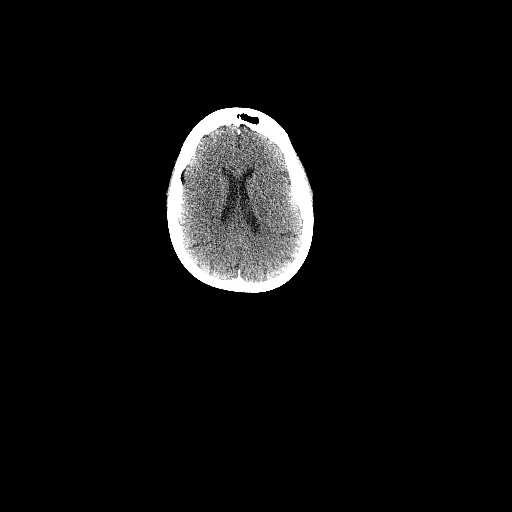

[1 of 25 positions shown; findings below may reference images not displayed]

FINDINGS: Mediastinal blood pool activity: SUV max

NECK: Interval progression of hypermetabolic cervical
lymphadenopathy.

Index left level II node measured previously at 12 mm with SUV max =
3.5 measures similar a 12 mm today but demonstrates SUV max = 5.

Right-sided level III lymph node measured previously at 10 mm short
axis is now 12 mm. SUV max = 7.4 today compared to 2.4 previously.

Similar stable to a minimal increase in size with diffusely
increased hypermetabolism noted in the numerous bilateral cervical
lymph nodes.

Incidental CT findings: none

CHEST: Interval progression of hypermetabolism in the bilateral
axillary lymphadenopathy.

The index low left axillary node measured previously at 1.3 cm short
axis with SUV max = 3 now measures 1.5 cm short axis with SUV max =
4.6. Other left axillary lymph nodes demonstrate SUV max up to 7.4.

Index lymph node in the right axilla measured previously at 1.1 cm
now measures 1.2 cm. SUV max = 8.1 today compared to 2.8 previously.

Hypermetabolic lymph nodes are seen in the mediastinum in each
hilum. 11 mm short axis subcarinal lymph node has SUV max = 4.7.

Incidental CT findings: Coronary artery calcification is evident.
Atherosclerotic calcification is noted in the wall of the thoracic
aorta.

ABDOMEN/PELVIS: No abnormal radiotracer uptake identified within the
liver, pancreas, or adrenal glands. SUV max = for the spleen days
3.5 compared to 3.2 previously.

Bulky central abdominal mesenteric lymphadenopathy is again noted.
Nodal conglomeration measured previously at 16.5 x 9.3 cm now
measures 18.2 x 11.6 cm. SUV max = 8.7 today in this nodal
conglomeration compared to 3.9 previously.

Similar progression of pelvic sidewall lymphadenopathy bilaterally.
Right pelvic sidewall lymph node measured previously at 2.1 cm is
now 3.0 cm. SUV max = 9.4 today compared to 3.4 previously.

Incidental CT findings: There is abdominal aortic atherosclerosis
without aneurysm. Layering sludge or tiny stones noted in the
gallbladder.

SKELETON: Mottled marrow uptake identified without definite focal
hypermetabolism to suggest metastatic involvement.

Incidental CT findings: none
IMPRESSION: The extensive lymphadenopathy seen previously in the neck, chest,
abdomen, and pelvis shows slight interval increase in size by CT
imaging in the neck and chest but more prominent enlargement in the
abdomen.. This lymphadenopathy shows prominent increase in
hypermetabolism with uptake levels now compatible with [HOSPITAL] 5
criteria.

Aortic Atherosclerois (L51F3-170.0)

## 2019-06-26 MED ORDER — SODIUM CHLORIDE 0.9% FLUSH
10.0000 mL | Freq: Once | INTRAVENOUS | Status: AC
  Administered 2019-06-26: 10 mL
  Filled 2019-06-26: qty 10

## 2019-06-26 MED ORDER — HEPARIN SOD (PORK) LOCK FLUSH 100 UNIT/ML IV SOLN
500.0000 [IU] | Freq: Once | INTRAVENOUS | Status: AC
  Administered 2019-06-26: 500 [IU]
  Filled 2019-06-26: qty 5

## 2019-06-26 NOTE — Progress Notes (Signed)
NO IVF needed today based on labwork today per Ria Comment NP.  Patient agreed, she is drinking well.

## 2019-06-26 NOTE — Telephone Encounter (Signed)
Per lab review by provider - no IVF's needed today.

## 2019-07-01 NOTE — Progress Notes (Signed)
[facility]  Telephone:([phone]) [phone] [phone]) [phone]    ID: SHARADA SCHILTZ   DOB: Mar 05, 1951  MR#: [id number]  [id number]  Patient Care Team: Marton Redwood, MD as PCP - General (Internal Medicine) [name], MD as Consulting Physician (Orthopedic Surgery) Kharson Rasmusson, [name] Dad, MD as Consulting Physician (Oncology) [name], MD as Consulting Physician (General Surgery) Causey, [name], NP as Nurse Practitioner (Hematology and Oncology) OTHER MD:  CHIEF COMPLAINT: Small lymphocytic lymphoma/chronic lymphocytic leukemia  CURRENT TREATMENT: venetoclax, obinutuzumab, IVIG   INTERVAL HISTORY: Poppie returns today for follow-up and treatment of her CLL.   She continues on venetoclax ramp-up.  She is tolerating this remarkably well.  She has had no rash, no diarrhea, no cough phlegm production or pleurisy, no fatigue and no evidence of tumor lysis.  She is increasing the dose to 200 mg daily as of today.  She also receives obinutuzumab every 3 weeks.  Her next dose is due 07/03/2019.  She has no side effects from these treatments.  She also continues on IVIG which she receives whenever her total IgG drops near or below 400.  She last received this on 06/26/2019.  Most recent IgG level on 06/25/2019 was 129 4 mg/dL.  Since her last visit here, she has not undergone any additional studies. Her last PET scan was on 10/29/2018.  She has measurable disease on physical exam.   REVIEW OF SYSTEMS: Learline feels generally quite well.  She has had no fevers, rash, or any other systemic symptoms.  She feels her lymph nodes are decreasing.  She denies any cough phlegm production pleurisy shortness of breath or change in bowel or bladder habits.  Detailed review of systems is stable   HISTORY OF PRESENT ILLNESS: From the original intake note:  [name] developed what she thought was "the flu" in December of 2002. She noted a large lymph node  developing in her right anterior cervical area. 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   Her subsequent history is as detailed above.   PAST MEDICAL HISTORY: Past Medical History:  Diagnosis Date  . Allergy   . Arthritis   . Asthma   . Cancer [facility])    chronic lymphacytic leukemia  . CLL (chronic lymphocytic leukemia) ([place])   . Diabetes mellitus    PMH  . Headache    migraine  . Hyperlipidemia   . Hypertension   . Hypothyroidism   . Lymphoma ([place])   . Thyroid disease    hypothyroidism  . Wears glasses     PAST SURGICAL HISTORY: Past Surgical History:  Procedure Laterality Date  . ABDOMINAL HYSTERECTOMY    . AXILLARY LYMPH NODE BIOPSY Right 08/21/2018   Procedure: RIGHT AXILLARY LYMPH NODE BIOPSY ERAS PATHWAY;  Surgeon: [name], MD;  Location: [facility];  Service: General;  Laterality: Right;  LMA  . BREAST EXCISIONAL BIOPSY Right   . COLONOSCOPY    . IR IMAGING GUIDED PORT INSERTION  11/10/2018  . LYMPH NODE DISSECTION    . STRABISMUS SURGERY    . TONSILLECTOMY    . TOTAL KNEE ARTHROPLASTY Right 12/03/2016   Procedure: RIGHT TOTAL KNEE ARTHROPLASTY;  Surgeon: [name], MD;  Location: [facility];  Service: Orthopedics;  Laterality: Right;    FAMILY HISTORY: Family History  Problem Relation Age of Onset  . Liver cancer Mother   . Heart disease Father   . Breast cancer Sister 46  .  Colon cancer Neg Hx   . Pancreatic cancer Neg Hx   . Rectal cancer Neg Hx   . Stomach cancer Neg Hx    The patient's father died at the age of 58 from a myocardial infarction. The patient's mother died at the age of 41 from primary liver carcinoma. The patient had no brothers. Her one sister, [name], has a history of breast cancer   GYNECOLOGIC HISTORY: Menarche at around age 14. The patient is GX P0. 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 She lives by herself. She had two cats, [name] and Olanta, but they are currently with her sister and Amiri thinks they would be more at home there. She attends a [employer], operational.               ADVANCED DIRECTIVES: Completed 02/03/2019. She has named her niece her 31.   HEALTH MAINTENANCE: Social History   Socioeconomic History  . Marital status: Single    Spouse name: Not on file  . Number of children: Not on file  . Years of education: Not on file  . Highest education level: Not on file  Occupational History  . Not on file  Social Needs  . Financial resource strain: Not hard at all  . Food insecurity    Worry: Never true    Inability: Never true  . Transportation needs    Medical: No    Non-medical: No  Tobacco Use  . Smoking status: Never Smoker  . Smokeless tobacco: Never Used  Substance and Sexual Activity  . Alcohol use: Yes    Comment: occasional alcohol intake; once or twice monthly  . Drug use: No  . Sexual activity: Not on file  Lifestyle  . Physical activity    Days per week: 0 days    Minutes per session: 0 min  . Stress: Not at all  Relationships  . Social Herbalist on phone: Once a week    Gets together: Once a week    Attends religious service: Never    Active member of club or organization: No    Attends meetings of clubs or organizations: Never    Relationship status: Never married  . Intimate partner violence    Fear of current or ex partner: No    Emotionally abused: No    Physically abused: No    Forced sexual activity: No  Other Topics Concern  . Not on file  Social History Narrative  . Not on file                Colonoscopy: 06/01/2014/[facility]             PAP:             Bone density:             Lipid panel:  228/114/50/155/ratio 4.6  On 10/01/2016  ALLERGIES: Allergies  Allergen  Reactions  . Iohexol Swelling and Rash  . Compazine [Prochlorperazine Edisylate] Other (See Comments)    Mild confusion  . Contrast Media  [Iodinated Diagnostic Agents]   . Lactose Intolerance (Gi)     Diarrhea, gas bloating  . Cephalexin Rash    CURRENT MEDICATIONS: Current Outpatient Medications  Medication Sig Dispense Refill  . acetaminophen (TYLENOL 8 HOUR ARTHRITIS PAIN) 650 MG CR  tablet Take 650 mg by mouth every 8 (eight) hours as needed for pain.    Marland Kitchen acyclovir (ZOVIRAX) 200 MG capsule TAKE 1 CAPSULE (200 MG TOTAL) BY MOUTH 3 (THREE) TIMES DAILY. 270 capsule 1  . allopurinol (ZYLOPRIM) 300 MG tablet TAKE 1 TABLET BY MOUTH EVERY DAY 90 tablet 1  . enoxaparin (LOVENOX) 30 MG/0.3ML injection Inject 0.9 mLs (90 mg total) into the skin daily. 30 mL 4  . furosemide (LASIX) 20 MG tablet TAKE 1 TABLET BY MOUTH EVERY DAY 90 tablet 1  . hydrocortisone (ANUSOL-HC) 2.5 % rectal cream Place rectally 3 (three) times daily. 30 g 0  . KLOR-CON M20 20 MEQ tablet TAKE 1 TABLET BY MOUTH EVERY DAY 30 tablet 0  . lidocaine-prilocaine (EMLA) cream Apply to affected area once 30 g 3  . ondansetron (ZOFRAN) 8 MG tablet Take 0.5-1 tablets (4-8 mg total) by mouth every 8 (eight) hours as needed for nausea or vomiting. 30 tablet 0  . rosuvastatin (CRESTOR) 10 MG tablet Take 10 mg by mouth at bedtime.  11  . SYNTHROID 88 MCG tablet Take 88 mcg by mouth daily before breakfast.     . venetoclax (VENCLEXTA STARTING PACK) 10 & 50 & 100 MG TBPK Week1: Take 20mg  once daily. [id number]: Take 50mg  once daily. [id number]: Take 100mg  once daily. EU:[id number]: Take 200mg  once daily. Take w food & water 42 each 0   No current facility-administered medications for this visit.     OBJECTIVE: Middle-aged white woman who appears well  There were no vitals filed for this visit. Wt Readings from Last 3 Encounters:  06/25/19 176 lb 14.4 oz (80.2 kg)  06/18/19 177 lb 14.4 oz (80.7 kg)  06/11/19 177 lb 1.6 oz (80.3 kg)   There is no  height or weight on file to calculate BMI.    ECOG FS:1 - Symptomatic but completely ambulatory  Sclerae unicteric, EOMs intact Wearing a mask The previously noted bilateral cervical and bilateral axillary adenopathy is markedly decreased Lungs no rales or rhonchi Heart regular rate and rhythm Abd soft, nontender, positive bowel sounds MSK no focal spinal tenderness, no upper extremity lymphedema Neuro: nonfocal, well oriented, appropriate affect Breasts: Deferred   LAB RESULTS: Lab Results  Component Value Date   WBC 4.1 06/26/2019   NEUTROABS 2.3 06/26/2019   HGB 11.2 (L) 06/26/2019   HCT 34.9 (L) 06/26/2019   MCV 94.8 06/26/2019   PLT 116 (L) 06/26/2019   CMP Latest Ref Rng & Units 06/26/2019 06/25/2019 06/19/2019  Glucose 70 - 99 mg/dL 90 93 118(H)  BUN 8 - 23 mg/dL 17 17 25(H)  Creatinine 0.44 - 1.00 mg/dL 0.75 0.76 0.86  Sodium 135 - 145 mmol/L 139 138 140  Potassium 3.5 - 5.1 mmol/L 4.3 4.2 4.0  Chloride 98 - 111 mmol/L 108 107 108  CO2 22 - 32 mmol/L 23 23 22   Calcium 8.9 - 10.3 mg/dL 9.2 9.1 9.0  Total Protein 6.5 - 8.1 g/dL 6.6 6.7 7.1  Total Bilirubin 0.3 - 1.2 mg/dL 0.4 0.4 0.3  Alkaline Phos 38 - 126 U/L 95 93 78  AST 15 - 41 U/L 19 18 14(L)  ALT 0 - 44 U/L 10 10 8     No results for input(s): LABCA2 in the last 72 hours.   STUDIES: No results found.   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             (b) rituximab added to ibrutinib, first dose 08/27/2018             (c) hepatitis B studies 08/27/2018 negative             (f) ibrutinib/rituximab discontinued after 10/02/2018 dose w/o obvious response  (6) bendamustine/rituximab started 10/22/2018, discontinued after 1 cycle with rapid progression  (7) obinutuzumab/Gaziva started 11/11/2018  (a) second dose given 11/19/2018 together with chemotherapy  (b) third dose given 12/26/2018 and continuing every 3 weeks  (8) CH[O]P chemotherapy started 11/19/2018, completed 8 doses 05/19/2019             (a) echocardiogram on 11/07/2018 shows EF of 55-60%  (b) second cycle of CH[O]P postponed because of pandemic, given 12/26/2018, greatly reduced doses  (c) cycle 3 of CH[O]P/obinutuzumab given 02/03/2019 at increased doses  (d) cycle 4 and 5 of CH[O]P given on 6/16 and 7/7 at increased doses of 35mg /m2 and 550mg /m2  (e) Repeat echo on 04/22/2019 shows an EF of 60-65%  (9) severe immunocompromise: Marked hypogammaglobulinemia  (a) starting March 2020 she receives IVIG every 8 weeks   (10) 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  (a) LMWH started 04/23/2019  (b) 75% dose reduction September 2020 secondary to bruising  (11) started venetoclax ramp-up 06/11/2019 at 20 mg/d  (a) increased to 50 mg/d on 06/18/2019  PLAN:  Siahna has ramped up beautifully on the venetoclax and is starting her 200 mg dose today.  She will receive obinu tomorrow and of course she will have repeat lab work at that time.  She has not yet received her 400 mg dose.  We  will work on that through our oral pharmacy chemotherapy group which has been so helpful and does not so many other cases  She will see [place] again in a week.  She knows to call for any other problems that may develop before the next visit.  Jeannette Maddy, [name] Dad, MD  07/01/19 12:56 PM Medical Oncology and Hematology [facility] [place], [place] [address] Tel. [phone]    Fax. [phone]   I, Wilburn Mylar, am acting as scribe for Dr. [name] Dad. Shyheim Tanney.  I, Lurline Del MD, have reviewed the above documentation for accuracy and completeness, and I agree with the above.

## 2019-07-02 ENCOUNTER — Inpatient Hospital Stay: Payer: Medicare Other | Admitting: Oncology

## 2019-07-02 ENCOUNTER — Other Ambulatory Visit: Payer: Self-pay

## 2019-07-02 ENCOUNTER — Inpatient Hospital Stay: Payer: Medicare Other

## 2019-07-02 ENCOUNTER — Other Ambulatory Visit: Payer: Self-pay | Admitting: Pharmacist

## 2019-07-02 VITALS — BP 119/47 | HR 64

## 2019-07-02 VITALS — BP 125/49 | HR 73 | Temp 97.8°F | Resp 18 | Ht 62.0 in | Wt 175.8 lb

## 2019-07-02 DIAGNOSIS — Z5112 Encounter for antineoplastic immunotherapy: Secondary | ICD-10-CM | POA: Diagnosis not present

## 2019-07-02 DIAGNOSIS — G620 Drug-induced polyneuropathy: Secondary | ICD-10-CM

## 2019-07-02 DIAGNOSIS — D696 Thrombocytopenia, unspecified: Secondary | ICD-10-CM | POA: Diagnosis not present

## 2019-07-02 DIAGNOSIS — T451X5A Adverse effect of antineoplastic and immunosuppressive drugs, initial encounter: Secondary | ICD-10-CM

## 2019-07-02 DIAGNOSIS — Z95828 Presence of other vascular implants and grafts: Secondary | ICD-10-CM

## 2019-07-02 DIAGNOSIS — C9110 Chronic lymphocytic leukemia of B-cell type not having achieved remission: Secondary | ICD-10-CM

## 2019-07-02 DIAGNOSIS — D801 Nonfamilial hypogammaglobulinemia: Secondary | ICD-10-CM

## 2019-07-02 LAB — CBC WITH DIFFERENTIAL/PLATELET
Abs Immature Granulocytes: 0.01 K/uL (ref 0.00–0.07)
Basophils Absolute: 0 K/uL (ref 0.0–0.1)
Basophils Relative: 1 %
Eosinophils Absolute: 0.1 K/uL (ref 0.0–0.5)
Eosinophils Relative: 2 %
HCT: 35.6 % — ABNORMAL LOW (ref 36.0–46.0)
Hemoglobin: 11.5 g/dL — ABNORMAL LOW (ref 12.0–15.0)
Immature Granulocytes: 0 %
Lymphocytes Relative: 38 %
Lymphs Abs: 1.5 K/uL (ref 0.7–4.0)
MCH: 29.7 pg (ref 26.0–34.0)
MCHC: 32.3 g/dL (ref 30.0–36.0)
MCV: 92 fL (ref 80.0–100.0)
Monocytes Absolute: 0.3 K/uL (ref 0.1–1.0)
Monocytes Relative: 8 %
Neutro Abs: 2 K/uL (ref 1.7–7.7)
Neutrophils Relative %: 51 %
Platelets: 111 K/uL — ABNORMAL LOW (ref 150–400)
RBC: 3.87 MIL/uL (ref 3.87–5.11)
RDW: 13.6 % (ref 11.5–15.5)
WBC: 3.9 K/uL — ABNORMAL LOW (ref 4.0–10.5)
nRBC: 0 % (ref 0.0–0.2)

## 2019-07-02 LAB — COMPREHENSIVE METABOLIC PANEL
ALT: 14 U/L (ref 0–44)
AST: 23 U/L (ref 15–41)
Albumin: 3.6 g/dL (ref 3.5–5.0)
Alkaline Phosphatase: 111 U/L (ref 38–126)
Anion gap: 7 (ref 5–15)
BUN: 17 mg/dL (ref 8–23)
CO2: 25 mmol/L (ref 22–32)
Calcium: 9.1 mg/dL (ref 8.9–10.3)
Chloride: 106 mmol/L (ref 98–111)
Creatinine, Ser: 0.74 mg/dL (ref 0.44–1.00)
GFR calc Af Amer: >60 mL/min (ref >60)
GFR calc non Af Amer: >60 mL/min (ref >60)
Glucose, Bld: 97 mg/dL (ref 70–99)
Potassium: 4 mmol/L (ref 3.5–5.1)
Sodium: 138 mmol/L (ref 135–145)
Total Bilirubin: 0.3 mg/dL (ref 0.3–1.2)
Total Protein: 6.5 g/dL (ref 6.5–8.1)

## 2019-07-02 LAB — URIC ACID: Uric Acid, Serum: 4 mg/dL (ref 2.5–7.1)

## 2019-07-02 LAB — PHOSPHORUS: Phosphorus: 3.8 mg/dL (ref 2.5–4.6)

## 2019-07-02 LAB — LACTATE DEHYDROGENASE: LDH: 167 U/L (ref 98–192)

## 2019-07-02 MED ORDER — HEPARIN SOD (PORK) LOCK FLUSH 100 UNIT/ML IV SOLN
500.0000 [IU] | Freq: Once | INTRAVENOUS | Status: AC
  Administered 2019-07-02: 500 [IU]
  Filled 2019-07-02: qty 5

## 2019-07-02 MED ORDER — SODIUM CHLORIDE 0.9% FLUSH
10.0000 mL | Freq: Once | INTRAVENOUS | Status: AC
  Administered 2019-07-02: 10 mL
  Filled 2019-07-02: qty 10

## 2019-07-02 MED ORDER — SODIUM CHLORIDE 0.9 % IV SOLN
Freq: Once | INTRAVENOUS | Status: AC
  Administered 2019-07-02: 11:00:00 via INTRAVENOUS
  Filled 2019-07-02: qty 250

## 2019-07-02 MED ORDER — VENETOCLAX 100 MG PO TABS: 400.0000 mg | ORAL_TABLET | Freq: Every day | ORAL | 2 refills | Status: DC

## 2019-07-02 NOTE — Telephone Encounter (Signed)
Oral Oncology Pharmacist Encounter  Received notification from MD that patient is doing very well with venetoclax and will need a new prescription for venetoclax 100 mg tablets to be able to start 400 mg daily on 07/09/19. Prescription for venetoclax 100 mg tablets, take 4 tablets (400 mg) by mouth daily with food and water at approximately the same time each day, quantity #120, refills=2, has been e-scribed to the [facility]. MD notified that prescription was sent.  Johny Drilling, PharmD, BCPS, BCOP  07/02/2019 12:11 PM Oral Oncology Clinic [phone]

## 2019-07-03 ENCOUNTER — Other Ambulatory Visit (HOSPITAL_COMMUNITY): Payer: Self-pay | Admitting: *Deleted

## 2019-07-03 ENCOUNTER — Other Ambulatory Visit: Payer: Self-pay

## 2019-07-03 ENCOUNTER — Inpatient Hospital Stay: Payer: Medicare Other

## 2019-07-03 ENCOUNTER — Telehealth: Payer: Self-pay

## 2019-07-03 VITALS — BP 119/57 | HR 69 | Temp 98.3°F | Resp 18

## 2019-07-03 DIAGNOSIS — C9110 Chronic lymphocytic leukemia of B-cell type not having achieved remission: Secondary | ICD-10-CM

## 2019-07-03 DIAGNOSIS — Z5112 Encounter for antineoplastic immunotherapy: Secondary | ICD-10-CM | POA: Diagnosis not present

## 2019-07-03 DIAGNOSIS — D801 Nonfamilial hypogammaglobulinemia: Secondary | ICD-10-CM

## 2019-07-03 DIAGNOSIS — Z95828 Presence of other vascular implants and grafts: Secondary | ICD-10-CM

## 2019-07-03 LAB — COMPREHENSIVE METABOLIC PANEL
ALT: 11 U/L (ref 0–44)
AST: 18 U/L (ref 15–41)
Albumin: 3.5 g/dL (ref 3.5–5.0)
Alkaline Phosphatase: 102 U/L (ref 38–126)
Anion gap: 6 (ref 5–15)
BUN: 18 mg/dL (ref 8–23)
CO2: 23 mmol/L (ref 22–32)
Calcium: 9.1 mg/dL (ref 8.9–10.3)
Chloride: 110 mmol/L (ref 98–111)
Creatinine, Ser: 0.8 mg/dL (ref 0.44–1.00)
GFR calc Af Amer: >60 mL/min (ref >60)
GFR calc non Af Amer: >60 mL/min (ref >60)
Glucose, Bld: 89 mg/dL (ref 70–99)
Potassium: 4.3 mmol/L (ref 3.5–5.1)
Sodium: 139 mmol/L (ref 135–145)
Total Bilirubin: 0.4 mg/dL (ref 0.3–1.2)
Total Protein: 6.3 g/dL — ABNORMAL LOW (ref 6.5–8.1)

## 2019-07-03 LAB — CBC WITH DIFFERENTIAL/PLATELET
Abs Immature Granulocytes: 0.01 10*3/uL (ref 0.00–0.07)
Basophils Absolute: 0 10*3/uL (ref 0.0–0.1)
Basophils Relative: 1 %
Eosinophils Absolute: 0.1 10*3/uL (ref 0.0–0.5)
Eosinophils Relative: 2 %
HCT: 34.7 % — ABNORMAL LOW (ref 36.0–46.0)
Hemoglobin: 11.1 g/dL — ABNORMAL LOW (ref 12.0–15.0)
Immature Granulocytes: 0 %
Lymphocytes Relative: 35 %
Lymphs Abs: 1.3 10*3/uL (ref 0.7–4.0)
MCH: 30 pg (ref 26.0–34.0)
MCHC: 32 g/dL (ref 30.0–36.0)
MCV: 93.8 fL (ref 80.0–100.0)
Monocytes Absolute: 0.3 10*3/uL (ref 0.1–1.0)
Monocytes Relative: 8 %
Neutro Abs: 2 10*3/uL (ref 1.7–7.7)
Neutrophils Relative %: 54 %
Platelets: 109 10*3/uL — ABNORMAL LOW (ref 150–400)
RBC: 3.7 MIL/uL — ABNORMAL LOW (ref 3.87–5.11)
RDW: 13.6 % (ref 11.5–15.5)
WBC: 3.7 10*3/uL — ABNORMAL LOW (ref 4.0–10.5)
nRBC: 0 % (ref 0.0–0.2)

## 2019-07-03 LAB — IGG, IGA, IGM
IgA: 23 mg/dL — ABNORMAL LOW (ref 87–352)
IgG (Immunoglobin G), Serum: 1091 mg/dL (ref 586–1602)
IgM (Immunoglobulin M), Srm: <5 mg/dL — ABNORMAL LOW (ref 26–217)

## 2019-07-03 LAB — PHOSPHORUS: Phosphorus: 4.5 mg/dL (ref 2.5–4.6)

## 2019-07-03 LAB — URIC ACID: Uric Acid, Serum: 3.9 mg/dL (ref 2.5–7.1)

## 2019-07-03 MED ORDER — SODIUM CHLORIDE 0.9% FLUSH
10.0000 mL | Freq: Once | INTRAVENOUS | Status: AC
  Administered 2019-07-03: 10 mL
  Filled 2019-07-03: qty 10

## 2019-07-03 MED ORDER — DEXAMETHASONE SODIUM PHOSPHATE 10 MG/ML IJ SOLN
10.0000 mg | Freq: Once | INTRAMUSCULAR | Status: AC
  Administered 2019-07-03: 10 mg via INTRAVENOUS

## 2019-07-03 MED ORDER — SODIUM CHLORIDE 0.9 % IV SOLN
1000.0000 mg | Freq: Once | INTRAVENOUS | Status: AC
  Administered 2019-07-03: 11:00:00 1000 mg via INTRAVENOUS
  Filled 2019-07-03: qty 40

## 2019-07-03 MED ORDER — HEPARIN SOD (PORK) LOCK FLUSH 100 UNIT/ML IV SOLN
500.0000 [IU] | Freq: Once | INTRAVENOUS | Status: AC | PRN
  Administered 2019-07-03: 500 [IU]
  Filled 2019-07-03: qty 5

## 2019-07-03 MED ORDER — SODIUM CHLORIDE 0.9% FLUSH
10.0000 mL | INTRAVENOUS | Status: DC | PRN
  Administered 2019-07-03: 10 mL
  Filled 2019-07-03: qty 10

## 2019-07-03 MED ORDER — DIPHENHYDRAMINE HCL 50 MG/ML IJ SOLN
INTRAMUSCULAR | Status: AC
  Filled 2019-07-03: qty 1

## 2019-07-03 MED ORDER — DEXAMETHASONE SODIUM PHOSPHATE 10 MG/ML IJ SOLN
INTRAMUSCULAR | Status: AC
  Filled 2019-07-03: qty 1

## 2019-07-03 MED ORDER — SODIUM CHLORIDE 0.9 % IV SOLN
Freq: Once | INTRAVENOUS | Status: AC
  Administered 2019-07-03: 10:00:00 via INTRAVENOUS
  Filled 2019-07-03: qty 250

## 2019-07-03 MED ORDER — ACETAMINOPHEN 325 MG PO TABS
ORAL_TABLET | ORAL | Status: AC
  Filled 2019-07-03: qty 2

## 2019-07-03 MED ORDER — ACETAMINOPHEN 325 MG PO TABS
650.0000 mg | ORAL_TABLET | Freq: Once | ORAL | Status: AC
  Administered 2019-07-03: 650 mg via ORAL

## 2019-07-03 MED ORDER — DIPHENHYDRAMINE HCL 50 MG/ML IJ SOLN
25.0000 mg | Freq: Once | INTRAMUSCULAR | Status: AC
  Administered 2019-07-03: 25 mg via INTRAVENOUS

## 2019-07-03 MED FILL — VENCLEXTA 100 MG TABS: 100 | 30 days supply | Qty: 120 | Fill #0

## 2019-07-03 NOTE — Patient Instructions (Signed)

## 2019-07-03 NOTE — Telephone Encounter (Signed)
Oral Oncology Patient Advocate Encounter  Was successful in securing patient a $8000 grant from [employer] to provide copayment coverage for Venclexta. This will keep the out of pocket expense at $0.   I have spoken with the patient..   The billing information is as follows and has been shared with [place].   Member ID: [id number] Group ID: [id number] RxBin: [id number] Dates of Eligibility: 06/03/19 through 06/01/20  [place] Patient [place] Phone [phone] Fax [phone] 07/03/2019    1:17 PM

## 2019-07-03 NOTE — Patient Instructions (Signed)
[facility] Discharge Instructions for Patients Receiving Chemotherapy  Today you received the following chemotherapy agents: Gazyva   To help prevent nausea and vomiting after your treatment, we encourage you to take your nausea medication as directed.    If you develop nausea and vomiting that is not controlled by your nausea medication, call the clinic.   BELOW ARE SYMPTOMS THAT SHOULD BE REPORTED IMMEDIATELY:  *FEVER GREATER THAN 100.5 F  *CHILLS WITH OR WITHOUT FEVER  NAUSEA AND VOMITING THAT IS NOT CONTROLLED WITH YOUR NAUSEA MEDICATION  *UNUSUAL SHORTNESS OF BREATH  *UNUSUAL BRUISING OR BLEEDING  TENDERNESS IN MOUTH AND THROAT WITH OR WITHOUT PRESENCE OF ULCERS  *URINARY PROBLEMS  *BOWEL PROBLEMS  UNUSUAL RASH Items with * indicate a potential emergency and should be followed up as soon as possible.  Feel free to call the clinic should you have any questions or concerns. The clinic phone number is [phone].  Please show the CHEMO ALERT CARD at check-in to the Emergency Department and triage nurse.   

## 2019-07-04 LAB — IGG, IGA, IGM
IgA: 22 mg/dL — ABNORMAL LOW (ref 87–352)
IgG (Immunoglobin G), Serum: 979 mg/dL (ref 586–1602)
IgM (Immunoglobulin M), Srm: <5 mg/dL — ABNORMAL LOW (ref 26–217)

## 2019-07-06 ENCOUNTER — Other Ambulatory Visit: Payer: Self-pay

## 2019-07-06 ENCOUNTER — Ambulatory Visit (HOSPITAL_COMMUNITY)
Admission: RE | Admit: 2019-07-06 | Discharge: 2019-07-06 | Disposition: A | Discharge status: Home / Self Care | Payer: Medicare Other | Source: Ambulatory Visit | Attending: Internal Medicine | Admitting: Internal Medicine

## 2019-07-06 DIAGNOSIS — M810 Age-related osteoporosis without current pathological fracture: Secondary | ICD-10-CM | POA: Insufficient documentation

## 2019-07-06 MED ORDER — ZOLEDRONIC ACID 5 MG/100ML IV SOLN
INTRAVENOUS | Status: AC
  Filled 2019-07-06: qty 100

## 2019-07-06 MED ORDER — ZOLEDRONIC ACID 5 MG/100ML IV SOLN
5.0000 mg | Freq: Once | INTRAVENOUS | Status: AC
  Administered 2019-07-06: 5 mg via INTRAVENOUS

## 2019-07-07 ENCOUNTER — Other Ambulatory Visit: Payer: Self-pay | Admitting: Oncology

## 2019-07-07 DIAGNOSIS — C9110 Chronic lymphocytic leukemia of B-cell type not having achieved remission: Secondary | ICD-10-CM

## 2019-07-09 ENCOUNTER — Inpatient Hospital Stay: Payer: Medicare Other | Admitting: Adult Health

## 2019-07-09 ENCOUNTER — Other Ambulatory Visit: Payer: Medicare Other

## 2019-07-09 ENCOUNTER — Inpatient Hospital Stay: Payer: Medicare Other

## 2019-07-09 ENCOUNTER — Other Ambulatory Visit: Payer: Self-pay

## 2019-07-09 ENCOUNTER — Telehealth: Payer: Self-pay | Admitting: Emergency Medicine

## 2019-07-09 ENCOUNTER — Ambulatory Visit: Payer: Medicare Other | Admitting: Oncology

## 2019-07-09 ENCOUNTER — Encounter: Payer: Self-pay | Admitting: Adult Health

## 2019-07-09 VITALS — BP 135/48 | HR 64 | Temp 98.2°F | Resp 18 | Ht 66.0 in | Wt 177.2 lb

## 2019-07-09 DIAGNOSIS — D801 Nonfamilial hypogammaglobulinemia: Secondary | ICD-10-CM

## 2019-07-09 DIAGNOSIS — C9110 Chronic lymphocytic leukemia of B-cell type not having achieved remission: Secondary | ICD-10-CM

## 2019-07-09 DIAGNOSIS — Z95828 Presence of other vascular implants and grafts: Secondary | ICD-10-CM

## 2019-07-09 DIAGNOSIS — Z5112 Encounter for antineoplastic immunotherapy: Secondary | ICD-10-CM | POA: Diagnosis not present

## 2019-07-09 LAB — COMPREHENSIVE METABOLIC PANEL
ALT: 17 U/L (ref 0–44)
AST: 26 U/L (ref 15–41)
Albumin: 3.7 g/dL (ref 3.5–5.0)
Alkaline Phosphatase: 117 U/L (ref 38–126)
Anion gap: 8 (ref 5–15)
BUN: 19 mg/dL (ref 8–23)
CO2: 22 mmol/L (ref 22–32)
Calcium: 8.5 mg/dL — ABNORMAL LOW (ref 8.9–10.3)
Chloride: 108 mmol/L (ref 98–111)
Creatinine, Ser: 0.78 mg/dL (ref 0.44–1.00)
GFR calc Af Amer: >60 mL/min (ref >60)
GFR calc non Af Amer: >60 mL/min (ref >60)
Glucose, Bld: 98 mg/dL (ref 70–99)
Potassium: 4.1 mmol/L (ref 3.5–5.1)
Sodium: 138 mmol/L (ref 135–145)
Total Bilirubin: 0.3 mg/dL (ref 0.3–1.2)
Total Protein: 6.4 g/dL — ABNORMAL LOW (ref 6.5–8.1)

## 2019-07-09 LAB — CBC WITH DIFFERENTIAL/PLATELET
Abs Immature Granulocytes: 0 10*3/uL (ref 0.00–0.07)
Basophils Absolute: 0 10*3/uL (ref 0.0–0.1)
Basophils Relative: 1 %
Eosinophils Absolute: 0.1 10*3/uL (ref 0.0–0.5)
Eosinophils Relative: 2 %
HCT: 36.7 % (ref 36.0–46.0)
Hemoglobin: 11.8 g/dL — ABNORMAL LOW (ref 12.0–15.0)
Immature Granulocytes: 0 %
Lymphocytes Relative: 44 %
Lymphs Abs: 1.4 10*3/uL (ref 0.7–4.0)
MCH: 29.6 pg (ref 26.0–34.0)
MCHC: 32.2 g/dL (ref 30.0–36.0)
MCV: 92.2 fL (ref 80.0–100.0)
Monocytes Absolute: 0.3 10*3/uL (ref 0.1–1.0)
Monocytes Relative: 9 %
Neutro Abs: 1.4 10*3/uL — ABNORMAL LOW (ref 1.7–7.7)
Neutrophils Relative %: 44 %
Platelets: 121 10*3/uL — ABNORMAL LOW (ref 150–400)
RBC: 3.98 MIL/uL (ref 3.87–5.11)
RDW: 13.4 % (ref 11.5–15.5)
WBC: 3.1 10*3/uL — ABNORMAL LOW (ref 4.0–10.5)
nRBC: 0 % (ref 0.0–0.2)

## 2019-07-09 LAB — URIC ACID: Uric Acid, Serum: 4.3 mg/dL (ref 2.5–7.1)

## 2019-07-09 LAB — PHOSPHORUS: Phosphorus: 3.7 mg/dL (ref 2.5–4.6)

## 2019-07-09 LAB — LACTATE DEHYDROGENASE: LDH: 139 U/L (ref 98–192)

## 2019-07-09 MED ORDER — SODIUM CHLORIDE 0.9% FLUSH
10.0000 mL | Freq: Once | INTRAVENOUS | Status: AC
  Administered 2019-07-09: 10 mL
  Filled 2019-07-09: qty 10

## 2019-07-09 NOTE — Telephone Encounter (Signed)
IVF appt cancelled for today per NP Mendel Ryder.  Pt's port has home dressing & biopatch in place, no need to deaccess as pt is returning to [facility] tomorrow.  Appt cancelled, charge RN Amy aware.

## 2019-07-09 NOTE — Progress Notes (Signed)
Maria [name]  [phone] Fax:([phone]) [phone]    ID: [id number]   DOB: Jan 10, 1951  MR#: [id number]  [id number]  Patient Care Team: Marton Redwood, MD as PCP - General (Internal Medicine) [name], MD as Consulting Physician (Orthopedic Surgery) [name], [name] Dad, MD as Consulting Physician (Oncology) [name], MD as Consulting Physician (General Surgery) Jeanett Antonopoulos, [name], NP as Nurse Practitioner (Hematology and Oncology) OTHER MD:  CHIEF COMPLAINT: Small lymphocytic lymphoma/chronic lymphocytic leukemia  CURRENT TREATMENT: venetoclax, obinutuzumab, IVIG   INTERVAL HISTORY: Maria [name] returns today for follow-up and treatment of her CLL.   She continues on venetoclax ramp-up.  She is tolerating this remarkably well.  She has had no rash, no diarrhea, no cough phlegm production or pleurisy, no fatigue and no evidence of tumor lysis.  She is increasing the dose to 400 mg daily as of today.  She also receives obinutuzumab every 3 weeks.  Her next dose is due 07/24/2019.   She also continues on IVIG which she receives whenever her total IgG drops near or below 400.  She last received this on 06/26/2019.    Since her last visit here, she has not undergone any additional studies. Her last PET scan was on 10/29/2018.  She has measurable disease on physical exam.  Maria [name] receives Reclast with Dr. Manuella Ghazi and had this earlier this week.  She has been achy since then.    REVIEW OF SYSTEMS: Maria [name] notes an improvement in her lymphadenopathy.  She says that the areas in her neck and axilla are markedly improved and challenging to palpate.  Her weight is stable.  She is tolerating the IV fluids well and takes lasix PO when needed.  She is drinking very well and has tolerated her increase to venetoclax today without any issues.    Maria [name] denies any nausea, vomiting, bowel/bladder changes, cough, shortness of breath, chest pain,  palpitations, fever chills, skin changes, or any other concerns.  A detailed ROS was otherwise non contributory.     HISTORY OF PRESENT ILLNESS: From the original intake note:  Maria [name] developed what she thought was "the flu" in December of 2002. She noted a large lymph node developing in her right anterior cervical area. 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   Her subsequent history is as detailed above.   PAST MEDICAL HISTORY: Past Medical History:  Diagnosis Date   Allergy    Arthritis    Asthma    Cancer ([place])    chronic lymphacytic leukemia   CLL (chronic lymphocytic leukemia) ([place])    Diabetes mellitus    PMH   Headache    migraine   Hyperlipidemia    Hypertension    Hypothyroidism    Lymphoma ([place])    Thyroid disease    hypothyroidism   Wears glasses     PAST SURGICAL HISTORY: Past Surgical History:  Procedure Laterality Date   ABDOMINAL HYSTERECTOMY     AXILLARY LYMPH NODE BIOPSY Right 08/21/2018   Procedure: RIGHT AXILLARY LYMPH NODE BIOPSY ERAS PATHWAY;  Surgeon: [name], MD;  Location: [facility];  Service: General;  Laterality: Right;  LMA   BREAST EXCISIONAL BIOPSY Right    COLONOSCOPY     IR IMAGING GUIDED PORT INSERTION  11/10/2018   LYMPH NODE DISSECTION     STRABISMUS SURGERY     TONSILLECTOMY     TOTAL KNEE ARTHROPLASTY Right 12/03/2016   Procedure: RIGHT  TOTAL KNEE ARTHROPLASTY;  Surgeon: [name], MD;  Location: [facility];  Service: Orthopedics;  Laterality: Right;    FAMILY HISTORY: Family History  Problem Relation Age of Onset   Liver cancer Mother    Heart disease Father    Breast cancer Sister 51   Colon cancer Neg Hx    Pancreatic cancer Neg Hx    Rectal cancer Neg Hx    Stomach cancer Neg Hx    The patient's father died at the age of 70 from a myocardial infarction. The patient's mother died at the age of 67 from  primary liver carcinoma. The patient had no brothers. Her one sister, Maria [name], has a history of breast cancer   GYNECOLOGIC HISTORY: Menarche at around age 71. The patient is GX P0. She underwent simple hysterectomy without salpingo-oophorectomy in [place]: (Updated 02/13/2019)  She reports that she recently retired from being a Therapist, sports at [employer], working in the [place], [place] x 5d/week. She lives by herself. She had two cats, [name] and Nehawka, but they are currently with her sister and Maria [name] thinks they would be more at home there. She attends a [employer], operational.               ADVANCED DIRECTIVES: Completed 02/03/2019. She has named her niece her 63.   HEALTH MAINTENANCE: Social History   Socioeconomic History   Marital status: Single    Spouse name: Not on file   Number of children: Not on file   Years of education: Not on file   Highest education level: Not on file  Occupational History   Not on file  Social Needs   Financial resource strain: Not hard at all   Food insecurity    Worry: Never true    Inability: Never true   Transportation needs    Medical: No    Non-medical: No  Tobacco Use   Smoking status: Never Smoker   Smokeless tobacco: Never Used  Substance and Sexual Activity   Alcohol use: Yes    Comment: occasional alcohol intake; once or twice monthly   Drug use: No   Sexual activity: Not on file  Lifestyle   Physical activity    Days per week: 0 days    Minutes per session: 0 min   Stress: Not at all  Relationships   Social connections    Talks on phone: Once a week    Gets together: Once a week    Attends religious service: Never    Active member of club or organization: No    Attends meetings of clubs or organizations: Never    Relationship status: Never married   Intimate partner violence    Fear of current or ex partner: No    Emotionally abused: No    Physically abused: No    Forced sexual activity:  No  Other Topics Concern   Not on file  Social History Narrative   Not on file                Colonoscopy: 06/01/2014/[facility]             PAP:             Bone density:             Lipid panel:  228/114/50/155/ratio 4.6  On 10/01/2016  ALLERGIES: Allergies  Allergen Reactions   Iohexol Swelling and Rash   Compazine [Prochlorperazine Edisylate] Other (See Comments)  Mild confusion   Contrast Media  [Iodinated Diagnostic Agents]    Lactose Intolerance (Gi)     Diarrhea, gas bloating   Cephalexin Rash    CURRENT MEDICATIONS: Current Outpatient Medications  Medication Sig Dispense Refill   acetaminophen (TYLENOL 8 HOUR ARTHRITIS PAIN) 650 MG CR tablet Take 650 mg by mouth every 8 (eight) hours as needed for pain.     acyclovir (ZOVIRAX) 200 MG capsule TAKE 1 CAPSULE (200 MG TOTAL) BY MOUTH 3 (THREE) TIMES DAILY. 270 capsule 1   allopurinol (ZYLOPRIM) 300 MG tablet TAKE 1 TABLET BY MOUTH EVERY DAY 90 tablet 1   enoxaparin (LOVENOX) 30 MG/0.3ML injection Inject 0.9 mLs (90 mg total) into the skin daily. 30 mL 4   furosemide (LASIX) 20 MG tablet TAKE 1 TABLET BY MOUTH EVERY DAY 90 tablet 1   hydrocortisone (ANUSOL-HC) 2.5 % rectal cream Place rectally 3 (three) times daily. 30 g 0   KLOR-CON M20 20 MEQ tablet TAKE 1 TABLET BY MOUTH EVERY DAY 30 tablet 0   lidocaine-prilocaine (EMLA) cream Apply to affected area once 30 g 3   ondansetron (ZOFRAN) 8 MG tablet Take 0.5-1 tablets (4-8 mg total) by mouth every 8 (eight) hours as needed for nausea or vomiting. 30 tablet 0   rosuvastatin (CRESTOR) 10 MG tablet Take 10 mg by mouth at bedtime.  11   SYNTHROID 88 MCG tablet Take 88 mcg by mouth daily before breakfast.      venetoclax 100 MG TABS Take 400 mg by mouth daily. Take with food and water at approximately the same time each day. 120 tablet 2   Zoledronic Acid (RECLAST IV) Inject into the vein. Once a year     No current facility-administered medications for this  visit.     OBJECTIVE: Middle-aged white woman who appears well  Vitals:   07/09/19 1016  BP: (!) 135/48  [name]: 64  Resp: 18  Temp: 98.2 F (36.8 C)  SpO2: 100%   Wt Readings from Last 3 Encounters:  07/09/19 177 lb 3.2 oz (80.4 kg)  07/06/19 173 lb (78.5 kg)  07/02/19 175 lb 12.8 oz (79.7 kg)   Body mass index is 28.6 kg/m.    ECOG FS:1 - Symptomatic but completely ambulatory  GENERAL: Patient is a well appearing female in no acute distress HEENT:  Sclerae anicteric.  Oropharynx clear and moist. No ulcerations or evidence of oropharyngeal candidiasis. Neck is supple.  NODES:  Difficult to palpate previous SCV and cervical adnepathy, difficult to palpate right axillary adenpathy BREAST EXAM:  Deferred. LUNGS:  Clear to auscultation bilaterally.  No wheezes or rhonchi. HEART:  Regular rate and rhythm. No murmur appreciated. ABDOMEN:  Soft, nontender.  Positive, normoactive bowel sounds. No organomegaly palpated. MSK:  No focal spinal tenderness to palpation. Full range of motion bilaterally in the upper extremities. EXTREMITIES:  No peripheral edema.   SKIN:  Clear with no obvious rashes or skin changes. No nail dyscrasia. NEURO:  Nonfocal. Well oriented.  Appropriate affect.    LAB RESULTS: Lab Results  Component Value Date   WBC 3.1 (L) 07/09/2019   NEUTROABS 1.4 (L) 07/09/2019   HGB 11.8 (L) 07/09/2019   HCT 36.7 07/09/2019   MCV 92.2 07/09/2019   PLT 121 (L) 07/09/2019   CMP Latest Ref Rng & Units 07/09/2019 07/03/2019 07/02/2019  Glucose 70 - 99 mg/dL 98 89 97  BUN 8 - 23 mg/dL 19 18 17   Creatinine 0.44 - 1.00 mg/dL 0.78 0.80 0.74  Sodium 135 - 145 mmol/L 138 139 138  Potassium 3.5 - 5.1 mmol/L 4.1 4.3 4.0  Chloride 98 - 111 mmol/L 108 110 106  CO2 22 - 32 mmol/L 22 23 25   Calcium 8.9 - 10.3 mg/dL 8.5(L) 9.1 9.1  Total Protein 6.5 - 8.1 g/dL 6.4(L) 6.3(L) 6.5  Total Bilirubin 0.3 - 1.2 mg/dL 0.3 0.4 0.3  Alkaline Phos 38 - 126 U/L 117 102 111  AST 15 -  41 U/L 26 18 23   ALT 0 - 44 U/L 17 11 14     No results for input(s): LABCA2 in the last 72 hours.   STUDIES: No results found.   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             (b) rituximab added to ibrutinib, first dose 08/27/2018             (c) hepatitis B studies 08/27/2018 negative             (f) ibrutinib/rituximab discontinued after 10/02/2018 dose w/o obvious response  (6) bendamustine/rituximab started 10/22/2018, discontinued after 1 cycle with rapid progression  (7) obinutuzumab/Gaziva started 11/11/2018  (a) second dose given 11/19/2018 together with chemotherapy  (b) third dose given 12/26/2018 and continuing every 3 weeks  (8) CH[O]P chemotherapy started 11/19/2018, completed 8 doses 05/19/2019             (a) echocardiogram on 11/07/2018 shows EF of 55-60%  (b) second cycle of CH[O]P postponed because of pandemic, given 12/26/2018, greatly reduced doses  (c) cycle 3 of CH[O]P/obinutuzumab given 02/03/2019 at increased doses  (d) cycle 4 and 5 of  CH[O]P given on 6/16 and 7/7 at increased doses of 35mg /m2 and 550mg /m2  (e) Repeat echo on 04/22/2019 shows an EF of 60-65%  (9) severe immunocompromise: Marked hypogammaglobulinemia  (a) starting March 2020 she receives IVIG every 8 weeks   (10) 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  (a) LMWH started 04/23/2019  (b) 75% dose reduction September 2020 secondary to bruising  (11) started venetoclax ramp-up 06/11/2019 at 20 mg/d  (a) increased to 50 mg/d on 06/18/2019  PLAN:  Jaslin is tolerating Venetoclax well and her nodes have responded nicely.  She is tolerating this well.  Her PO fluid intake is going well and she has no signs of TLS.  She will continue with PO fluid intake today and forego IV fluids.  She will return tomorrow for repeat labs.  Should her labs remain stable tomorrow, and she continue with  good fluid intake, then she can also forego IV fluids tomorrow as well.   Shelbylynn and I talked about her [place] of 1.4.  She will return in 1 week for labs only so we can monitor this.  Merriann and I talked about her other treatments with Obinutuzumab and IVIG.  I went ahead and tentatively scheduled these.  We talked about fluid intake, healthy diet, and activity.  We will see her back in 3 weeks when the next Obinutuzumab is due.  She will continue on the Venetoclax.  She was recommended to continue with the appropriate pandemic precautions. She knows to call for any questions that may arise between now and her next appointment.  We are happy to see her sooner if needed.  A total of (30) minutes of face-to-face time was spent with this patient with greater than 50% of that time in counseling and care-coordination.   Wilber Bihari, NP 07/09/19 11:00 AM Medical Oncology and Hematology [facility] [place], [place] [address] Tel. [phone]    Fax. [phone]

## 2019-07-09 NOTE — Patient Instructions (Signed)

## 2019-07-10 ENCOUNTER — Other Ambulatory Visit: Payer: Self-pay

## 2019-07-10 ENCOUNTER — Inpatient Hospital Stay: Payer: Medicare Other

## 2019-07-10 VITALS — BP 125/58 | HR 64 | Temp 98.5°F | Resp 18

## 2019-07-10 DIAGNOSIS — Z95828 Presence of other vascular implants and grafts: Secondary | ICD-10-CM

## 2019-07-10 DIAGNOSIS — D801 Nonfamilial hypogammaglobulinemia: Secondary | ICD-10-CM

## 2019-07-10 DIAGNOSIS — Z5112 Encounter for antineoplastic immunotherapy: Secondary | ICD-10-CM | POA: Diagnosis not present

## 2019-07-10 DIAGNOSIS — C9110 Chronic lymphocytic leukemia of B-cell type not having achieved remission: Secondary | ICD-10-CM

## 2019-07-10 LAB — COMPREHENSIVE METABOLIC PANEL
ALT: 18 U/L (ref 0–44)
AST: 25 U/L (ref 15–41)
Albumin: 3.4 g/dL — ABNORMAL LOW (ref 3.5–5.0)
Alkaline Phosphatase: 122 U/L (ref 38–126)
Anion gap: 8 (ref 5–15)
BUN: 14 mg/dL (ref 8–23)
CO2: 23 mmol/L (ref 22–32)
Calcium: 8.1 mg/dL — ABNORMAL LOW (ref 8.9–10.3)
Chloride: 109 mmol/L (ref 98–111)
Creatinine, Ser: 0.75 mg/dL (ref 0.44–1.00)
GFR calc Af Amer: >60 mL/min (ref >60)
GFR calc non Af Amer: >60 mL/min (ref >60)
Glucose, Bld: 99 mg/dL (ref 70–99)
Potassium: 3.9 mmol/L (ref 3.5–5.1)
Sodium: 140 mmol/L (ref 135–145)
Total Bilirubin: 0.3 mg/dL (ref 0.3–1.2)
Total Protein: 6.3 g/dL — ABNORMAL LOW (ref 6.5–8.1)

## 2019-07-10 LAB — CBC WITH DIFFERENTIAL/PLATELET
Abs Immature Granulocytes: 0 10*3/uL (ref 0.00–0.07)
Basophils Absolute: 0 10*3/uL (ref 0.0–0.1)
Basophils Relative: 1 %
Eosinophils Absolute: 0 10*3/uL (ref 0.0–0.5)
Eosinophils Relative: 1 %
HCT: 34.5 % — ABNORMAL LOW (ref 36.0–46.0)
Hemoglobin: 11.1 g/dL — ABNORMAL LOW (ref 12.0–15.0)
Immature Granulocytes: 0 %
Lymphocytes Relative: 34 %
Lymphs Abs: 1.2 10*3/uL (ref 0.7–4.0)
MCH: 29.4 pg (ref 26.0–34.0)
MCHC: 32.2 g/dL (ref 30.0–36.0)
MCV: 91.3 fL (ref 80.0–100.0)
Monocytes Absolute: 0.3 10*3/uL (ref 0.1–1.0)
Monocytes Relative: 8 %
Neutro Abs: 2 10*3/uL (ref 1.7–7.7)
Neutrophils Relative %: 56 %
Platelets: 112 10*3/uL — ABNORMAL LOW (ref 150–400)
RBC: 3.78 MIL/uL — ABNORMAL LOW (ref 3.87–5.11)
RDW: 13.5 % (ref 11.5–15.5)
WBC: 3.6 10*3/uL — ABNORMAL LOW (ref 4.0–10.5)
nRBC: 0 % (ref 0.0–0.2)

## 2019-07-10 LAB — IGG, IGA, IGM
IgA: 22 mg/dL — ABNORMAL LOW (ref 87–352)
IgG (Immunoglobin G), Serum: 899 mg/dL (ref 586–1602)
IgM (Immunoglobulin M), Srm: <5 mg/dL — ABNORMAL LOW (ref 26–217)

## 2019-07-10 LAB — URIC ACID: Uric Acid, Serum: 3.8 mg/dL (ref 2.5–7.1)

## 2019-07-10 LAB — LACTATE DEHYDROGENASE: LDH: 135 U/L (ref 98–192)

## 2019-07-10 MED ORDER — SODIUM CHLORIDE 0.9% FLUSH
10.0000 mL | Freq: Once | INTRAVENOUS | Status: AC
  Administered 2019-07-10: 10 mL
  Filled 2019-07-10: qty 10

## 2019-07-10 MED ORDER — HEPARIN SOD (PORK) LOCK FLUSH 100 UNIT/ML IV SOLN
500.0000 [IU] | Freq: Once | INTRAVENOUS | Status: AC
  Administered 2019-07-10: 500 [IU]
  Filled 2019-07-10: qty 5

## 2019-07-10 MED ORDER — SODIUM CHLORIDE 0.9 % IV SOLN
INTRAVENOUS | Status: DC
  Administered 2019-07-10: 10:00:00 via INTRAVENOUS
  Filled 2019-07-10 (×2): qty 250

## 2019-07-10 MED ORDER — SODIUM CHLORIDE 0.9% FLUSH
10.0000 mL | Freq: Once | INTRAVENOUS | Status: AC
  Administered 2019-07-10: 09:00:00 10 mL
  Filled 2019-07-10: qty 10

## 2019-07-11 LAB — IGG, IGA, IGM
IgA: 24 mg/dL — ABNORMAL LOW (ref 87–352)
IgG (Immunoglobin G), Serum: 920 mg/dL (ref 586–1602)
IgM (Immunoglobulin M), Srm: 8 mg/dL — ABNORMAL LOW (ref 26–217)

## 2019-07-12 ENCOUNTER — Other Ambulatory Visit: Payer: Self-pay | Admitting: Oncology

## 2019-07-12 DIAGNOSIS — C9110 Chronic lymphocytic leukemia of B-cell type not having achieved remission: Secondary | ICD-10-CM

## 2019-07-13 ENCOUNTER — Telehealth: Payer: Self-pay | Admitting: *Deleted

## 2019-07-13 NOTE — Telephone Encounter (Signed)
No new note; opened in error to close view.

## 2019-07-16 ENCOUNTER — Other Ambulatory Visit: Payer: Self-pay

## 2019-07-16 ENCOUNTER — Inpatient Hospital Stay: Payer: Medicare Other | Attending: Oncology

## 2019-07-16 DIAGNOSIS — Z5112 Encounter for antineoplastic immunotherapy: Secondary | ICD-10-CM | POA: Insufficient documentation

## 2019-07-16 DIAGNOSIS — C9110 Chronic lymphocytic leukemia of B-cell type not having achieved remission: Secondary | ICD-10-CM | POA: Diagnosis present

## 2019-07-16 LAB — CBC WITH DIFFERENTIAL/PLATELET
Abs Immature Granulocytes: 0 10*3/uL (ref 0.00–0.07)
Basophils Absolute: 0 10*3/uL (ref 0.0–0.1)
Basophils Relative: 1 %
Eosinophils Absolute: 0 10*3/uL (ref 0.0–0.5)
Eosinophils Relative: 0 %
HCT: 37.6 % (ref 36.0–46.0)
Hemoglobin: 12 g/dL (ref 12.0–15.0)
Immature Granulocytes: 0 %
Lymphocytes Relative: 37 %
Lymphs Abs: 1.2 10*3/uL (ref 0.7–4.0)
MCH: 29.3 pg (ref 26.0–34.0)
MCHC: 31.9 g/dL (ref 30.0–36.0)
MCV: 91.9 fL (ref 80.0–100.0)
Monocytes Absolute: 0.3 10*3/uL (ref 0.1–1.0)
Monocytes Relative: 8 %
Neutro Abs: 1.8 10*3/uL (ref 1.7–7.7)
Neutrophils Relative %: 54 %
Platelets: 127 10*3/uL — ABNORMAL LOW (ref 150–400)
RBC: 4.09 MIL/uL (ref 3.87–5.11)
RDW: 13.3 % (ref 11.5–15.5)
WBC: 3.3 10*3/uL — ABNORMAL LOW (ref 4.0–10.5)
nRBC: 0 % (ref 0.0–0.2)

## 2019-07-16 LAB — COMPREHENSIVE METABOLIC PANEL
ALT: 19 U/L (ref 0–44)
AST: 22 U/L (ref 15–41)
Albumin: 3.7 g/dL (ref 3.5–5.0)
Alkaline Phosphatase: 121 U/L (ref 38–126)
Anion gap: 8 (ref 5–15)
BUN: 19 mg/dL (ref 8–23)
CO2: 24 mmol/L (ref 22–32)
Calcium: 9.6 mg/dL (ref 8.9–10.3)
Chloride: 107 mmol/L (ref 98–111)
Creatinine, Ser: 1 mg/dL (ref 0.44–1.00)
GFR calc Af Amer: >60 mL/min (ref >60)
GFR calc non Af Amer: 58 mL/min — ABNORMAL LOW (ref >60)
Glucose, Bld: 101 mg/dL — ABNORMAL HIGH (ref 70–99)
Potassium: 4.1 mmol/L (ref 3.5–5.1)
Sodium: 139 mmol/L (ref 135–145)
Total Bilirubin: 0.4 mg/dL (ref 0.3–1.2)
Total Protein: 6.6 g/dL (ref 6.5–8.1)

## 2019-07-16 LAB — LACTATE DEHYDROGENASE: LDH: 142 U/L (ref 98–192)

## 2019-07-16 LAB — URIC ACID: Uric Acid, Serum: 3.2 mg/dL (ref 2.5–7.1)

## 2019-07-17 LAB — IGG, IGA, IGM
IgA: 26 mg/dL — ABNORMAL LOW (ref 87–352)
IgG (Immunoglobin G), Serum: 790 mg/dL (ref 586–1602)
IgM (Immunoglobulin M), Srm: <5 mg/dL — ABNORMAL LOW (ref 26–217)

## 2019-07-23 ENCOUNTER — Other Ambulatory Visit: Payer: Self-pay | Admitting: Oncology

## 2019-07-23 NOTE — Progress Notes (Signed)
Maria [name]  [phone] [phone]    ID: Maria [name]   DOB: [birth year]  MR#: [id number]  [id number]  Patient Care Team: Maria Redwood, MD as PCP - General (Internal Medicine) Maria Arabian, MD as Consulting Physician (Orthopedic Surgery) Maria [name], Maria Dad, MD as Consulting Physician (Oncology) Maria Skates, MD as Consulting Physician (General Surgery) Maria [name], Maria Massed, NP as Nurse Practitioner (Hematology and Oncology) OTHER MD:  CHIEF COMPLAINT: Small lymphocytic lymphoma/chronic lymphocytic leukemia  CURRENT TREATMENT: venetoclax, obinutuzumab, IVIG   INTERVAL HISTORY: Maria [name] returns today for follow-up and treatment of her CLL.   She continues on Venetoclax and is tolerating this well.    She also receives obinutuzumab every 3 weeks.  She is due for another dose of this today.     She also continues on IVIG which she receives whenever her total IgG drops near or below 400.  She will potentially need this in 3 more weeks.   Since her last visit here, she has not undergone any additional studies. Her nodes remain gone.     REVIEW OF SYSTEMS: Maria [name] continues to feel well.  She is mildly fatigued.  She has some mild bruising on her hands, and fingertips, but is otherwise feeling well.  She has no rash, fever, chills, chest pain, palpitations, cough, shortness of breath, nausea, vomiting, bowel/bladder changes, headaches, or any other issues.  A detailed ROS was otherwise non contributory.    HISTORY OF PRESENT ILLNESS: From the original intake note:  Maria [name] Pulse developed what she thought was "the flu" in December of 2002. She noted a large lymph node developing in her right anterior cervical area. 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   Her subsequent history is as detailed above.   PAST MEDICAL HISTORY: Past Medical  History:  Diagnosis Date   Allergy    Arthritis    Asthma    Cancer ([place])    chronic lymphacytic leukemia   CLL (chronic lymphocytic leukemia) ([place])    Diabetes mellitus    PMH   Headache    migraine   Hyperlipidemia    Hypertension    Hypothyroidism    Lymphoma ([place])    Thyroid disease    hypothyroidism   Wears glasses     PAST SURGICAL HISTORY: Past Surgical History:  Procedure Laterality Date   ABDOMINAL HYSTERECTOMY     AXILLARY LYMPH NODE BIOPSY Right 08/21/2018   Procedure: RIGHT AXILLARY LYMPH NODE BIOPSY ERAS PATHWAY;  Surgeon: Maria Skates, MD;  Location: [facility];  Service: General;  Laterality: Right;  LMA   BREAST EXCISIONAL BIOPSY Right    COLONOSCOPY     IR IMAGING GUIDED PORT INSERTION  11/10/2018   LYMPH NODE DISSECTION     STRABISMUS SURGERY     TONSILLECTOMY     TOTAL KNEE ARTHROPLASTY Right 12/03/2016   Procedure: RIGHT TOTAL KNEE ARTHROPLASTY;  Surgeon: Maria Arabian, MD;  Location: [facility];  Service: Orthopedics;  Laterality: Right;    FAMILY HISTORY: Family History  Problem Relation Age of Onset   Liver cancer Mother    Heart disease Father    Breast cancer Sister 88   Colon cancer Neg Hx    Pancreatic cancer Neg Hx    Rectal cancer Neg Hx    Stomach cancer Neg Hx    The patient's father died at the age of 61 from a myocardial infarction. The patient's mother died at  the age of 47 from primary liver carcinoma. The patient had no brothers. Her one sister, Maria [name], has a history of breast cancer   GYNECOLOGIC HISTORY: Menarche at around age 89. The patient is GX P0. 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 She lives by herself. She had two cats, [name] and [place], but they are currently with her sister and Maria [name] thinks they would be  more at home there. She attends a [employer], operational.               ADVANCED DIRECTIVES: Completed 02/03/2019. She has named her niece her 38.   HEALTH MAINTENANCE: Social History   Socioeconomic History   Marital status: Single    Spouse name: Not on file   Number of children: Not on file   Years of education: Not on file   Highest education level: Not on file  Occupational History   Not on file  Social Needs   Financial resource strain: Not hard at all   Food insecurity    Worry: Never true    Inability: Never true   Transportation needs    Medical: No    Non-medical: No  Tobacco Use   Smoking status: Never Smoker   Smokeless tobacco: Never Used  Substance and Sexual Activity   Alcohol use: Yes    Comment: occasional alcohol intake; once or twice monthly   Drug use: No   Sexual activity: Not on file  Lifestyle   Physical activity    Days per week: 0 days    Minutes per session: 0 min   Stress: Not at all  Relationships   Social connections    Talks on phone: Once a week    Gets together: Once a week    Attends religious service: Never    Active member of club or organization: No    Attends meetings of clubs or organizations: Never    Relationship status: Never married   Intimate partner violence    Fear of current or ex partner: No    Emotionally abused: No    Physically abused: No    Forced sexual activity: No  Other Topics Concern   Not on file  Social History Narrative   Not on file                Colonoscopy: 06/01/2014/[facility]             PAP:             Bone density:             Lipid panel:  228/114/50/155/ratio 4.6  On 10/01/2016  ALLERGIES: Allergies  Allergen Reactions   Iohexol Swelling and Rash   Compazine [Prochlorperazine Edisylate] Other (See Comments)    Mild confusion   Contrast Media  [Iodinated Diagnostic Agents]    Lactose Intolerance (Gi)     Diarrhea, gas bloating   Cephalexin Rash     CURRENT MEDICATIONS: Current Outpatient Medications  Medication Sig Dispense Refill   acetaminophen (TYLENOL 8 HOUR ARTHRITIS PAIN) 650 MG CR tablet Take 650 mg by mouth every 8 (eight) hours as needed for pain.     acyclovir (ZOVIRAX) 200 MG capsule TAKE 1 CAPSULE (200 MG TOTAL) BY MOUTH 3 (THREE) TIMES DAILY. 270 capsule 1   allopurinol (ZYLOPRIM) 300 MG tablet TAKE  1 TABLET BY MOUTH EVERY DAY 90 tablet 1   enoxaparin (LOVENOX) 30 MG/0.3ML injection Inject 0.9 mLs (90 mg total) into the skin daily. 30 mL 4   furosemide (LASIX) 20 MG tablet TAKE 1 TABLET BY MOUTH EVERY DAY 90 tablet 1   hydrocortisone (ANUSOL-HC) 2.5 % rectal cream Place rectally 3 (three) times daily. 30 g 0   KLOR-CON M20 20 MEQ tablet TAKE 1 TABLET BY MOUTH EVERY DAY 30 tablet 0   lidocaine-prilocaine (EMLA) cream Apply to affected area once 30 g 3   ondansetron (ZOFRAN) 8 MG tablet Take 0.5-1 tablets (4-8 mg total) by mouth every 8 (eight) hours as needed for nausea or vomiting. 30 tablet 0   rosuvastatin (CRESTOR) 10 MG tablet Take 10 mg by mouth at bedtime.  11   SYNTHROID 88 MCG tablet Take 88 mcg by mouth daily before breakfast.      venetoclax 100 MG TABS Take 400 mg by mouth daily. Take with food and water at approximately the same time each day. 120 tablet 2   Zoledronic Acid (RECLAST IV) Inject into the vein. Once a year     No current facility-administered medications for this visit.    Facility-Administered Medications Ordered in Other Visits  Medication Dose Route Frequency Provider Last Rate Last Dose   dexamethasone (DECADRON) injection 10 mg  10 mg Intravenous Once Maria [name], Maria Dad, MD       diphenhydrAMINE (BENADRYL) injection 25 mg  25 mg Intravenous Once Maria [name], Maria Dad, MD       heparin lock flush 100 unit/mL  500 Units Intracatheter Once PRN Maria [name], Maria Dad, MD       obinutuzumab (GAZYVA) 1,000 mg in sodium chloride 0.9 % 250 mL (3.4483 mg/mL) chemo infusion  1,000 mg  Intravenous Once Maria [name], Maria Dad, MD       sodium chloride flush (NS) 0.9 % injection 10 mL  10 mL Intracatheter PRN Maria [name], Maria Dad, MD        OBJECTIVE:   Vitals:   07/24/19 0825  BP: (!) 126/42  Pulse: 67  Resp: 18  Temp: 97.9 F (36.6 C)  SpO2: 100%   Wt Readings from Last 3 Encounters:  07/09/19 177 lb 3.2 oz (80.4 kg)  07/06/19 173 lb (78.5 kg)  07/02/19 175 lb 12.8 oz (79.7 kg)   Body mass index is 28.6 kg/m.    ECOG FS:1 - Symptomatic but completely ambulatory  GENERAL: Patient is a well appearing female in no acute distress HEENT:  Sclerae anicteric.  Oropharynx clear and moist. No ulcerations or evidence of oropharyngeal candidiasis. Neck is supple.  NODES: unable to palpate cervical, supraclavicular, or axillary adenopathy BREAST EXAM:  Deferred. LUNGS:  Clear to auscultation bilaterally.  No wheezes or rhonchi. HEART:  Regular rate and rhythm. No murmur appreciated. ABDOMEN:  Soft, nontender.  Positive, normoactive bowel sounds. No organomegaly palpated. MSK:  No focal spinal tenderness to palpation. Full range of motion bilaterally in the upper extremities. EXTREMITIES:  No peripheral edema.   SKIN:  Clear with no obvious rashes or skin changes. No nail dyscrasia. NEURO:  Nonfocal. Well oriented.  Appropriate affect.    LAB RESULTS: Lab Results  Component Value Date   WBC 2.7 (L) 07/24/2019   NEUTROABS 1.1 (L) 07/24/2019   HGB 12.1 07/24/2019   HCT 38.8 07/24/2019   MCV 91.3 07/24/2019   PLT 126 (L) 07/24/2019   CMP Latest Ref Rng & Units 07/24/2019 07/16/2019 07/10/2019  Glucose 70 - 99  mg/dL 96 101(H) 99  BUN 8 - 23 mg/dL 17 19 14   Creatinine 0.44 - 1.00 mg/dL 0.90 1.00 0.75  Sodium 135 - 145 mmol/L 140 139 140  Potassium 3.5 - 5.1 mmol/L 4.0 4.1 3.9  Chloride 98 - 111 mmol/L 106 107 109  CO2 22 - 32 mmol/L 24 24 23   Calcium 8.9 - 10.3 mg/dL 9.4 9.6 8.1(L)  Total Protein 6.5 - 8.1 g/dL 6.6 6.6 6.3(L)  Total Bilirubin 0.3 - 1.2 mg/dL 0.4  0.4 0.3  Alkaline Phos 38 - 126 U/L 120 121 122  AST 15 - 41 U/L 23 22 25   ALT 0 - 44 U/L 20 19 18     No results for input(s): LABCA2 in the last 72 hours.   STUDIES: No results found.   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             (b) rituximab added to ibrutinib, first dose 08/27/2018             (c) hepatitis B studies 08/27/2018 negative             (f) ibrutinib/rituximab discontinued after 10/02/2018 dose w/o obvious response  (6) bendamustine/rituximab started 10/22/2018, discontinued after 1 cycle with rapid progression  (7) obinutuzumab/Gaziva started 11/11/2018  (a) second dose given 11/19/2018 together with chemotherapy  (b) third dose given 12/26/2018 and continuing every 3 weeks  (8) CH[O]P chemotherapy started 11/19/2018, completed 8 doses 05/19/2019             (a) echocardiogram on 11/07/2018 shows EF of 55-60%  (b) second cycle of CH[O]P postponed because of pandemic, given 12/26/2018, greatly reduced doses  (c) cycle 3 of CH[O]P/obinutuzumab  given 02/03/2019 at increased doses  (d) cycle 4 and 5 of CH[O]P given on 6/16 and 7/7 at increased doses of 35mg /m2 and 550mg /m2  (e) Repeat echo on 04/22/2019 shows an EF of 60-65%  (9) severe immunocompromise: Marked hypogammaglobulinemia  (a) starting March 2020 she receives IVIG every 8 weeks   (10) 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  (a) LMWH started 04/23/2019  (b) 75% dose reduction September 2020 secondary to bruising  (11) started venetoclax ramp-up 06/11/2019 at 20 mg/d  (a) she is at 400mg /d  PLAN:   Janiaya is doing well today.  She continues on Venetoclax with good tolerance. She is slightly neutropenic with an ANC of 1.1.  I reviewed this with Dr. Jana Hakim who is ok for her to continue at this dose with the neutropenia.  Keayra will receive the Obinutuzumab today.  She toelrates this well.  She will likely be due for the IVIG in 3 more weeks so we have tentatively scheduled that plus the obinutuzumab.  I recommended continued healthy diet and exercise.  She will return in 2 weeks for labs and f/u with Dr. Jana Hakim.  She was recommended to continue with the appropriate pandemic precautions. She knows to call for any questions that may arise between now and her next appointment.  We are happy to see her sooner if needed.   A total of (20) minutes of face-to-face time was spent with this patient with greater than 50% of that time in counseling and care-coordination.   Wilber Bihari, NP 07/24/19 9:25 AM Medical Oncology and Hematology [facility] [place], [place] [address] Tel. [phone]    Fax. [phone]

## 2019-07-24 ENCOUNTER — Other Ambulatory Visit: Payer: Self-pay

## 2019-07-24 ENCOUNTER — Inpatient Hospital Stay: Payer: Medicare Other

## 2019-07-24 ENCOUNTER — Encounter: Payer: Self-pay | Admitting: Adult Health

## 2019-07-24 ENCOUNTER — Inpatient Hospital Stay: Payer: Medicare Other | Admitting: Adult Health

## 2019-07-24 VITALS — BP 125/49 | HR 73 | Temp 98.5°F | Resp 18

## 2019-07-24 VITALS — BP 126/42 | HR 67 | Temp 97.9°F | Resp 18 | Ht 66.0 in

## 2019-07-24 DIAGNOSIS — C9110 Chronic lymphocytic leukemia of B-cell type not having achieved remission: Secondary | ICD-10-CM

## 2019-07-24 DIAGNOSIS — Z5112 Encounter for antineoplastic immunotherapy: Secondary | ICD-10-CM | POA: Diagnosis not present

## 2019-07-24 DIAGNOSIS — D801 Nonfamilial hypogammaglobulinemia: Secondary | ICD-10-CM

## 2019-07-24 DIAGNOSIS — Z95828 Presence of other vascular implants and grafts: Secondary | ICD-10-CM

## 2019-07-24 LAB — CBC WITH DIFFERENTIAL/PLATELET
Abs Immature Granulocytes: 0 10*3/uL (ref 0.00–0.07)
Basophils Absolute: 0 10*3/uL (ref 0.0–0.1)
Basophils Relative: 1 %
Eosinophils Absolute: 0 10*3/uL (ref 0.0–0.5)
Eosinophils Relative: 0 %
HCT: 38.8 % (ref 36.0–46.0)
Hemoglobin: 12.1 g/dL (ref 12.0–15.0)
Immature Granulocytes: 0 %
Lymphocytes Relative: 43 %
Lymphs Abs: 1.1 10*3/uL (ref 0.7–4.0)
MCH: 28.5 pg (ref 26.0–34.0)
MCHC: 31.2 g/dL (ref 30.0–36.0)
MCV: 91.3 fL (ref 80.0–100.0)
Monocytes Absolute: 0.4 10*3/uL (ref 0.1–1.0)
Monocytes Relative: 15 %
Neutro Abs: 1.1 10*3/uL — ABNORMAL LOW (ref 1.7–7.7)
Neutrophils Relative %: 41 %
Platelets: 126 10*3/uL — ABNORMAL LOW (ref 150–400)
RBC: 4.25 MIL/uL (ref 3.87–5.11)
RDW: 13.2 % (ref 11.5–15.5)
WBC: 2.7 10*3/uL — ABNORMAL LOW (ref 4.0–10.5)
nRBC: 0 % (ref 0.0–0.2)

## 2019-07-24 LAB — LACTATE DEHYDROGENASE: LDH: 151 U/L (ref 98–192)

## 2019-07-24 LAB — COMPREHENSIVE METABOLIC PANEL
ALT: 20 U/L (ref 0–44)
AST: 23 U/L (ref 15–41)
Albumin: 3.8 g/dL (ref 3.5–5.0)
Alkaline Phosphatase: 120 U/L (ref 38–126)
Anion gap: 10 (ref 5–15)
BUN: 17 mg/dL (ref 8–23)
CO2: 24 mmol/L (ref 22–32)
Calcium: 9.4 mg/dL (ref 8.9–10.3)
Chloride: 106 mmol/L (ref 98–111)
Creatinine, Ser: 0.9 mg/dL (ref 0.44–1.00)
GFR calc Af Amer: >60 mL/min (ref >60)
GFR calc non Af Amer: >60 mL/min (ref >60)
Glucose, Bld: 96 mg/dL (ref 70–99)
Potassium: 4 mmol/L (ref 3.5–5.1)
Sodium: 140 mmol/L (ref 135–145)
Total Bilirubin: 0.4 mg/dL (ref 0.3–1.2)
Total Protein: 6.6 g/dL (ref 6.5–8.1)

## 2019-07-24 LAB — URIC ACID: Uric Acid, Serum: 3.1 mg/dL (ref 2.5–7.1)

## 2019-07-24 MED ORDER — DEXAMETHASONE SODIUM PHOSPHATE 10 MG/ML IJ SOLN
INTRAMUSCULAR | Status: AC
  Filled 2019-07-24: qty 1

## 2019-07-24 MED ORDER — SODIUM CHLORIDE 0.9% FLUSH
10.0000 mL | INTRAVENOUS | Status: DC | PRN
  Administered 2019-07-24: 10 mL
  Filled 2019-07-24: qty 10

## 2019-07-24 MED ORDER — DEXAMETHASONE SODIUM PHOSPHATE 10 MG/ML IJ SOLN
10.0000 mg | Freq: Once | INTRAMUSCULAR | Status: AC
  Administered 2019-07-24: 10 mg via INTRAVENOUS

## 2019-07-24 MED ORDER — SODIUM CHLORIDE 0.9 % IV SOLN
Freq: Once | INTRAVENOUS | Status: AC
  Administered 2019-07-24: 09:00:00 via INTRAVENOUS
  Filled 2019-07-24: qty 250

## 2019-07-24 MED ORDER — HEPARIN SOD (PORK) LOCK FLUSH 100 UNIT/ML IV SOLN
500.0000 [IU] | Freq: Once | INTRAVENOUS | Status: AC | PRN
  Administered 2019-07-24: 500 [IU]
  Filled 2019-07-24: qty 5

## 2019-07-24 MED ORDER — ACETAMINOPHEN 325 MG PO TABS
650.0000 mg | ORAL_TABLET | Freq: Once | ORAL | Status: AC
  Administered 2019-07-24: 650 mg via ORAL

## 2019-07-24 MED ORDER — SODIUM CHLORIDE 0.9% FLUSH
10.0000 mL | Freq: Once | INTRAVENOUS | Status: AC
  Administered 2019-07-24: 10 mL
  Filled 2019-07-24: qty 10

## 2019-07-24 MED ORDER — DIPHENHYDRAMINE HCL 50 MG/ML IJ SOLN
25.0000 mg | Freq: Once | INTRAMUSCULAR | Status: AC
  Administered 2019-07-24: 25 mg via INTRAVENOUS

## 2019-07-24 MED ORDER — DIPHENHYDRAMINE HCL 50 MG/ML IJ SOLN
INTRAMUSCULAR | Status: AC
  Filled 2019-07-24: qty 1

## 2019-07-24 MED ORDER — SODIUM CHLORIDE 0.9 % IV SOLN
1000.0000 mg | Freq: Once | INTRAVENOUS | Status: AC
  Administered 2019-07-24: 1000 mg via INTRAVENOUS
  Filled 2019-07-24: qty 40

## 2019-07-24 MED ORDER — ACETAMINOPHEN 325 MG PO TABS
ORAL_TABLET | ORAL | Status: AC
  Filled 2019-07-24: qty 2

## 2019-07-24 NOTE — Progress Notes (Signed)
Per Wilber Bihari ok to tx today with ANC 1.1

## 2019-07-24 NOTE — Patient Instructions (Signed)
[facility] Discharge Instructions for Patients Receiving Chemotherapy  Today you received the following chemotherapy agents: Gazyva   To help prevent nausea and vomiting after your treatment, we encourage you to take your nausea medication as directed.    If you develop nausea and vomiting that is not controlled by your nausea medication, call the clinic.   BELOW ARE SYMPTOMS THAT SHOULD BE REPORTED IMMEDIATELY:  *FEVER GREATER THAN 100.5 F  *CHILLS WITH OR WITHOUT FEVER  NAUSEA AND VOMITING THAT IS NOT CONTROLLED WITH YOUR NAUSEA MEDICATION  *UNUSUAL SHORTNESS OF BREATH  *UNUSUAL BRUISING OR BLEEDING  TENDERNESS IN MOUTH AND THROAT WITH OR WITHOUT PRESENCE OF ULCERS  *URINARY PROBLEMS  *BOWEL PROBLEMS  UNUSUAL RASH Items with * indicate a potential emergency and should be followed up as soon as possible.  Feel free to call the clinic should you have any questions or concerns. The clinic phone number is [phone].  Please show the CHEMO ALERT CARD at check-in to the Emergency Department and triage nurse.   

## 2019-07-26 LAB — IGG, IGA, IGM
IgA: 26 mg/dL — ABNORMAL LOW (ref 87–352)
IgG (Immunoglobin G), Serum: 742 mg/dL (ref 586–1602)
IgM (Immunoglobulin M), Srm: <5 mg/dL — ABNORMAL LOW (ref 26–217)

## 2019-07-28 MED FILL — VENCLEXTA 100 MG TABS: 100 | 30 days supply | Qty: 120 | Fill #1

## 2019-08-02 ENCOUNTER — Other Ambulatory Visit: Payer: Self-pay | Admitting: Oncology

## 2019-08-04 NOTE — Progress Notes (Signed)
[place]  [phone] [phone]    ID: Maria [name]   DOB: 23-Jan-1951  MR#: [id number]  [id number]  Patient Care Team: Maria Redwood, MD as PCP - General (Internal Medicine) Maria Arabian, MD as Consulting Physician (Orthopedic Surgery) Maria [name], Maria Dad, MD as Consulting Physician (Oncology) Maria Skates, MD as Consulting Physician (General Surgery) Maria [name], Maria Massed, NP as Nurse Practitioner (Hematology and Oncology) OTHER MD:  CHIEF COMPLAINT: Small lymphocytic lymphoma/chronic lymphocytic leukemia  CURRENT TREATMENT: venetoclax, obinutuzumab, [ IVIG ], Lovenox   INTERVAL HISTORY: Maria [name] returns today for follow-up and treatment of her CLL. She continues on Venetoclax, currently at full dose.  She has no side effects that she is aware of from this medication.  She does have perhaps a bit more joint aches and low back pain than she did before but that may be because she is also exercising less now that the weather has definitely changed.  She has not had any fever or rash and she does not palpate any adenopathy at this point.  She also receives obinutuzumab every 3 weeks.  She tolerates this well also.  She receives IVIG whenever her total IgG drops near or below 400.  Her counts actually have remained excellent and for now there is no need of that medication.  REVIEW OF SYSTEMS: Maria [name] walks perhaps 30 minutes at a time, not every day.  If the weather is bad she does not go out.  She does not have any exercise equipment at home and does not follow any videos.  She is planning to do some side dishes for Thanksgiving and her sister is cooking the [place] breast.  She is doing well on the Lovenox, with no bleeding or bruising problems.  A detailed review of systems today was otherwise stable   HISTORY OF PRESENT ILLNESS: From the original intake note:  Maria [name] developed what she thought was "the flu" in December of  2002. She noted a large lymph node developing in her right anterior cervical area. 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   Her subsequent history is as detailed above.   PAST MEDICAL HISTORY: Past Medical History:  Diagnosis Date  . Allergy   . Arthritis   . Asthma   . Cancer [facility])    chronic lymphacytic leukemia  . CLL (chronic lymphocytic leukemia) ([place])   . Diabetes mellitus    PMH  . Headache    migraine  . Hyperlipidemia   . Hypertension   . Hypothyroidism   . Lymphoma ([place])   . Thyroid disease    hypothyroidism  . Wears glasses     PAST SURGICAL HISTORY: Past Surgical History:  Procedure Laterality Date  . ABDOMINAL HYSTERECTOMY    . AXILLARY LYMPH NODE BIOPSY Right 08/21/2018   Procedure: RIGHT AXILLARY LYMPH NODE BIOPSY ERAS PATHWAY;  Surgeon: Maria Skates, MD;  Location: [facility];  Service: General;  Laterality: Right;  LMA  . BREAST EXCISIONAL BIOPSY Right   . COLONOSCOPY    . IR IMAGING GUIDED PORT INSERTION  11/10/2018  . LYMPH NODE DISSECTION    . STRABISMUS SURGERY    . TONSILLECTOMY    . TOTAL KNEE ARTHROPLASTY Right 12/03/2016   Procedure: RIGHT TOTAL KNEE ARTHROPLASTY;  Surgeon: Maria Arabian, MD;  Location: [facility];  Service: Orthopedics;  Laterality: Right;    FAMILY HISTORY: Family History  Problem Relation Age of Onset  . Liver  cancer Mother   . Heart disease Father   . Breast cancer Sister 48  . Colon cancer Neg Hx   . Pancreatic cancer Neg Hx   . Rectal cancer Neg Hx   . Stomach cancer Neg Hx    The patient's father died at the age of 6 from a myocardial infarction. The patient's mother died at the age of 5 from primary liver carcinoma. The patient had no brothers. Her one sister, Maria [name], has a history of breast cancer   GYNECOLOGIC HISTORY: Menarche at around age 63. The patient is GX P0. 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 She lives by herself. She had two cats, Maria [name] and Maria [name], but they are currently with her sister and Maria [name] thinks they would be more at home there. She attends a [employer], operational.               ADVANCED DIRECTIVES: Completed 02/03/2019. She has named her niece her 45.   HEALTH MAINTENANCE: Social History   Socioeconomic History  . Marital status: Single    Spouse name: Not on file  . Number of children: Not on file  . Years of education: Not on file  . Highest education level: Not on file  Occupational History  . Not on file  Social Needs  . Financial resource strain: Not hard at all  . Food insecurity    Worry: Never true    Inability: Never true  . Transportation needs    Medical: No    Non-medical: No  Tobacco Use  . Smoking status: Never Smoker  . Smokeless tobacco: Never Used  Substance and Sexual Activity  . Alcohol use: Yes    Comment: occasional alcohol intake; once or twice monthly  . Drug use: No  . Sexual activity: Not on file  Lifestyle  . Physical activity    Days per week: 0 days    Minutes per session: 0 min  . Stress: Not at all  Relationships  . Social Herbalist on phone: Once a week    Gets together: Once a week    Attends religious service: Never    Active member of club or organization: No    Attends meetings of clubs or organizations: Never    Relationship status: Never married  . Intimate partner violence    Fear of current or ex partner: No    Emotionally abused: No    Physically abused: No    Forced sexual activity: No  Other Topics Concern  . Not on file  Social History Narrative  . Not on file                Colonoscopy: 06/01/2014/[facility]             PAP:             Bone density:             Lipid panel:  228/114/50/155/ratio 4.6  On  10/01/2016  ALLERGIES: Allergies  Allergen Reactions  . Iohexol Swelling and Rash  . Compazine [Prochlorperazine Edisylate] Other (See Comments)    Mild confusion  . Contrast Media  [Iodinated Diagnostic Agents]   . Lactose Intolerance (Gi)     Diarrhea, gas bloating  . Cephalexin Rash    CURRENT MEDICATIONS: Current Outpatient  Medications  Medication Sig Dispense Refill  . acetaminophen (TYLENOL 8 HOUR ARTHRITIS PAIN) 650 MG CR tablet Take 650 mg by mouth every 8 (eight) hours as needed for pain.    Mar[place] Kitchen acyclovir (ZOVIRAX) 200 MG capsule TAKE 1 CAPSULE (200 MG TOTAL) BY MOUTH 3 (THREE) TIMES DAILY. 270 capsule 1  . allopurinol (ZYLOPRIM) 300 MG tablet TAKE 1 TABLET BY MOUTH EVERY DAY 90 tablet 1  . enoxaparin (LOVENOX) 30 MG/0.3ML injection Inject 0.9 mLs (90 mg total) into the skin daily. 30 mL 4  . furosemide (LASIX) 20 MG tablet TAKE 1 TABLET BY MOUTH EVERY DAY 90 tablet 1  . KLOR-CON M20 20 MEQ tablet TAKE 1 TABLET BY MOUTH EVERY DAY 30 tablet 0  . ondansetron (ZOFRAN) 8 MG tablet Take 0.5-1 tablets (4-8 mg total) by mouth every 8 (eight) hours as needed for nausea or vomiting. 30 tablet 0  . rosuvastatin (CRESTOR) 10 MG tablet Take 10 mg by mouth at bedtime.  11  . SYNTHROID 88 MCG tablet Take 88 mcg by mouth daily before breakfast.     . venetoclax 100 MG TABS Take 400 mg by mouth daily. Take with food and water at approximately the same time each day. 120 tablet 2  . Zoledronic Acid (RECLAST IV) Inject into the vein. Once a year     No current facility-administered medications for this visit.     OBJECTIVE: Middle-aged white woman in no acute distress  Vitals:   08/05/19 1323  BP: (!) 152/60  [name]: 67  Resp: 16  Temp: 97.8 F (36.6 C)  SpO2: 100%   Wt Readings from Last 3 Encounters:  08/05/19 182 lb 12.8 oz (82.9 kg)  07/09/19 177 lb 3.2 oz (80.4 kg)  07/06/19 173 lb (78.5 kg)   Body mass index is 29.5 kg/m.    ECOG FS:1 - Symptomatic but completely  ambulatory  Sclerae unicteric, EOMs intact Wearing a mask No cervical or supraclavicular adenopathy, no axillary or inguinal adenopathy Lungs no rales or rhonchi Heart regular rate and rhythm Abd soft, nontender, positive bowel sounds MSK no focal spinal tenderness, no lower extremity lymphedema Neuro: nonfocal, well oriented, appropriate affect Breasts: Deferred   LAB RESULTS: Lab Results  Component Value Date   WBC 2.6 (L) 08/05/2019   NEUTROABS 1.2 (L) 08/05/2019   HGB 12.3 08/05/2019   HCT 39.6 08/05/2019   MCV 90.6 08/05/2019   PLT 87 (L) 08/05/2019   CMP Latest Ref Rng & Units 07/24/2019 07/16/2019 07/10/2019  Glucose 70 - 99 mg/dL 96 101(H) 99  BUN 8 - 23 mg/dL 17 19 14   Creatinine 0.44 - 1.00 mg/dL 0.90 1.00 0.75  Sodium 135 - 145 mmol/L 140 139 140  Potassium 3.5 - 5.1 mmol/L 4.0 4.1 3.9  Chloride 98 - 111 mmol/L 106 107 109  CO2 22 - 32 mmol/L 24 24 23   Calcium 8.9 - 10.3 mg/dL 9.4 9.6 8.1(L)  Total Protein 6.5 - 8.1 g/dL 6.6 6.6 6.3(L)  Total Bilirubin 0.3 - 1.2 mg/dL 0.4 0.4 0.3  Alkaline Phos 38 - 126 U/L 120 121 122  AST 15 - 41 U/L 23 22 25   ALT 0 - 44 U/L 20 19 18     No results for input(s): LABCA2 in the last 72 hours.   STUDIES: No results found.   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             (b) rituximab added to ibrutinib, first dose 08/27/2018             (c) hepatitis B studies 08/27/2018 negative             (f) ibrutinib/rituximab discontinued after 10/02/2018 dose w/o obvious response  (6) bendamustine/rituximab started 10/22/2018, discontinued after 1 cycle with rapid progression  (7) obinutuzumab/Gaziva started 11/11/2018  (a) second dose given 11/19/2018 together with chemotherapy  (b) third dose given 12/26/2018 and continuing every 3 weeks  (8) CH[O]P chemotherapy started 11/19/2018, completed 8 doses 05/19/2019             (a) echocardiogram on 11/07/2018 shows EF of 55-60%  (b) second cycle of CH[O]P postponed because of pandemic, given 12/26/2018, greatly reduced doses  (c) cycle 3 of CH[O]P/obinutuzumab given 02/03/2019 at increased doses  (d) cycle 4 and 5 of CH[O]P given on 6/16 and 7/7 at increased doses of 35mg /m2 and 550mg /m2  (e) Repeat echo on 04/22/2019 shows an EF of 60-65%  (9) severe immunocompromise: Marked hypogammaglobulinemia  (a) starting March 2020 she receives IVIG every 8 weeks as needed  (10) 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  (a) LMWH started 04/23/2019  (b) 75% dose reduction September 2020 secondary to bruising  (11) started venetoclax ramp-up 06/11/2019, progressed to 400 mg/d dose w/o event  (a) she is at 400mg /d  PLAN:   Emmasophia looks healthier than any time that I have seen her in the past year.  She is tolerating her treatment well and her main concern right now is not gaining too much weight through the holidays.  The fly in the ointment is the fact that her platelets keep dropping.  They are almost 90,000 today so we do not  really need to take any action.  However if they keep dropping we may need to either drop the dose of venetoclax or discontinue the Lovenox.  I am going to add a D-dimer to her next set of labs, next week, and I am also going to set her up for a Doppler of the left lower extremity to be done right after she receives her infusion.  If everything is optimal potentially she could go off the Lovenox at that point  At this point I am delighted at how well she is doing.  She knows to call for any other issues that may develop before the next visit.   Maria [name]. Gaylon Melchor, MD 08/05/19 1:47 PM Medical Oncology and Hematology [facility] [address], [place] [address] Tel. [phone]    Fax. [phone]  I, Wilburn Mylar, am acting as scribe for Dr. Sarajane Jews C. Xiadani Damman.  I, Lurline Del MD, have reviewed the above documentation for accuracy and completeness, and I agree with the above.

## 2019-08-05 ENCOUNTER — Inpatient Hospital Stay: Payer: Medicare Other | Admitting: Oncology

## 2019-08-05 ENCOUNTER — Other Ambulatory Visit: Payer: Self-pay

## 2019-08-05 ENCOUNTER — Inpatient Hospital Stay: Payer: Medicare Other

## 2019-08-05 VITALS — BP 152/60 | HR 67 | Temp 97.8°F | Resp 16 | Ht 66.0 in | Wt 182.8 lb

## 2019-08-05 DIAGNOSIS — C9110 Chronic lymphocytic leukemia of B-cell type not having achieved remission: Secondary | ICD-10-CM | POA: Diagnosis not present

## 2019-08-05 DIAGNOSIS — D696 Thrombocytopenia, unspecified: Secondary | ICD-10-CM | POA: Diagnosis not present

## 2019-08-05 DIAGNOSIS — Z5112 Encounter for antineoplastic immunotherapy: Secondary | ICD-10-CM | POA: Diagnosis not present

## 2019-08-05 LAB — CMP (CANCER CENTER ONLY)
ALT: 40 U/L (ref 0–44)
AST: 46 U/L — ABNORMAL HIGH (ref 15–41)
Albumin: 3.7 g/dL (ref 3.5–5.0)
Alkaline Phosphatase: 171 U/L — ABNORMAL HIGH (ref 38–126)
Anion gap: 8 (ref 5–15)
BUN: 15 mg/dL (ref 8–23)
CO2: 25 mmol/L (ref 22–32)
Calcium: 9.1 mg/dL (ref 8.9–10.3)
Chloride: 107 mmol/L (ref 98–111)
Creatinine: 0.83 mg/dL (ref 0.44–1.00)
GFR, Est AFR Am: >60 mL/min (ref >60)
GFR, Estimated: >60 mL/min (ref >60)
Glucose, Bld: 104 mg/dL — ABNORMAL HIGH (ref 70–99)
Potassium: 4.1 mmol/L (ref 3.5–5.1)
Sodium: 140 mmol/L (ref 135–145)
Total Bilirubin: 0.4 mg/dL (ref 0.3–1.2)
Total Protein: 6.3 g/dL — ABNORMAL LOW (ref 6.5–8.1)

## 2019-08-05 LAB — CBC WITH DIFFERENTIAL/PLATELET
Abs Immature Granulocytes: 0 10*3/uL (ref 0.00–0.07)
Basophils Absolute: 0 10*3/uL (ref 0.0–0.1)
Basophils Relative: 0 %
Eosinophils Absolute: 0 10*3/uL (ref 0.0–0.5)
Eosinophils Relative: 0 %
HCT: 39.6 % (ref 36.0–46.0)
Hemoglobin: 12.3 g/dL (ref 12.0–15.0)
Immature Granulocytes: 0 %
Lymphocytes Relative: 40 %
Lymphs Abs: 1 10*3/uL (ref 0.7–4.0)
MCH: 28.1 pg (ref 26.0–34.0)
MCHC: 31.1 g/dL (ref 30.0–36.0)
MCV: 90.6 fL (ref 80.0–100.0)
Monocytes Absolute: 0.3 10*3/uL (ref 0.1–1.0)
Monocytes Relative: 13 %
Neutro Abs: 1.2 10*3/uL — ABNORMAL LOW (ref 1.7–7.7)
Neutrophils Relative %: 47 %
Platelets: 87 10*3/uL — ABNORMAL LOW (ref 150–400)
RBC: 4.37 MIL/uL (ref 3.87–5.11)
RDW: 13.4 % (ref 11.5–15.5)
WBC: 2.6 10*3/uL — ABNORMAL LOW (ref 4.0–10.5)
nRBC: 0 % (ref 0.0–0.2)

## 2019-08-05 LAB — LACTATE DEHYDROGENASE: LDH: 167 U/L (ref 98–192)

## 2019-08-06 LAB — IGG, IGA, IGM
IgA: 27 mg/dL — ABNORMAL LOW (ref 87–352)
IgG (Immunoglobin G), Serum: 627 mg/dL (ref 586–1602)
IgM (Immunoglobulin M), Srm: <5 mg/dL — ABNORMAL LOW (ref 26–217)

## 2019-08-07 ENCOUNTER — Telehealth: Payer: Self-pay | Admitting: Oncology

## 2019-08-07 NOTE — Telephone Encounter (Signed)
I talk with patient regarding schedule  

## 2019-08-10 ENCOUNTER — Other Ambulatory Visit: Payer: Self-pay | Admitting: Oncology

## 2019-08-10 DIAGNOSIS — I82402 Acute embolism and thrombosis of unspecified deep veins of left lower extremity: Secondary | ICD-10-CM

## 2019-08-11 ENCOUNTER — Telehealth: Payer: Self-pay | Admitting: Oncology

## 2019-08-11 NOTE — Telephone Encounter (Signed)
Called vascular about scheduling doppler

## 2019-08-13 ENCOUNTER — Ambulatory Visit (HOSPITAL_COMMUNITY)
Admission: RE | Admit: 2019-08-13 | Discharge: 2019-08-13 | Disposition: A | Discharge status: Home / Self Care | Payer: Medicare Other | Source: Ambulatory Visit | Attending: Oncology | Admitting: Oncology

## 2019-08-13 ENCOUNTER — Encounter: Payer: Self-pay | Admitting: Adult Health

## 2019-08-13 ENCOUNTER — Inpatient Hospital Stay: Payer: Medicare Other | Admitting: Adult Health

## 2019-08-13 ENCOUNTER — Inpatient Hospital Stay: Payer: Medicare Other | Attending: Oncology

## 2019-08-13 ENCOUNTER — Inpatient Hospital Stay: Payer: Medicare Other

## 2019-08-13 ENCOUNTER — Other Ambulatory Visit: Payer: Self-pay

## 2019-08-13 ENCOUNTER — Other Ambulatory Visit: Payer: Self-pay | Admitting: Oncology

## 2019-08-13 ENCOUNTER — Other Ambulatory Visit: Payer: Self-pay | Admitting: Adult Health

## 2019-08-13 VITALS — BP 159/66 | HR 69 | Temp 98.0°F | Resp 18 | Ht 66.0 in | Wt 182.2 lb

## 2019-08-13 DIAGNOSIS — I82402 Acute embolism and thrombosis of unspecified deep veins of left lower extremity: Secondary | ICD-10-CM | POA: Diagnosis present

## 2019-08-13 DIAGNOSIS — Z5112 Encounter for antineoplastic immunotherapy: Secondary | ICD-10-CM | POA: Diagnosis not present

## 2019-08-13 DIAGNOSIS — D696 Thrombocytopenia, unspecified: Secondary | ICD-10-CM

## 2019-08-13 DIAGNOSIS — C9110 Chronic lymphocytic leukemia of B-cell type not having achieved remission: Secondary | ICD-10-CM

## 2019-08-13 LAB — CBC WITH DIFFERENTIAL/PLATELET
Abs Immature Granulocytes: 0 10*3/uL (ref 0.00–0.07)
Basophils Absolute: 0 10*3/uL (ref 0.0–0.1)
Basophils Relative: 1 %
Eosinophils Absolute: 0 10*3/uL (ref 0.0–0.5)
Eosinophils Relative: 0 %
HCT: 40.5 % (ref 36.0–46.0)
Hemoglobin: 13 g/dL (ref 12.0–15.0)
Immature Granulocytes: 0 %
Lymphocytes Relative: 44 %
Lymphs Abs: 0.9 10*3/uL (ref 0.7–4.0)
MCH: 28.3 pg (ref 26.0–34.0)
MCHC: 32.1 g/dL (ref 30.0–36.0)
MCV: 88 fL (ref 80.0–100.0)
Monocytes Absolute: 0.4 10*3/uL (ref 0.1–1.0)
Monocytes Relative: 17 %
Neutro Abs: 0.8 10*3/uL — ABNORMAL LOW (ref 1.7–7.7)
Neutrophils Relative %: 38 %
Platelets: 111 10*3/uL — ABNORMAL LOW (ref 150–400)
RBC: 4.6 MIL/uL (ref 3.87–5.11)
RDW: 13.8 % (ref 11.5–15.5)
WBC: 2.1 10*3/uL — ABNORMAL LOW (ref 4.0–10.5)
nRBC: 0 % (ref 0.0–0.2)

## 2019-08-13 LAB — COMPREHENSIVE METABOLIC PANEL
ALT: 30 U/L (ref 0–44)
AST: 36 U/L (ref 15–41)
Albumin: 4 g/dL (ref 3.5–5.0)
Alkaline Phosphatase: 185 U/L — ABNORMAL HIGH (ref 38–126)
Anion gap: 8 (ref 5–15)
BUN: 17 mg/dL (ref 8–23)
CO2: 27 mmol/L (ref 22–32)
Calcium: 9.5 mg/dL (ref 8.9–10.3)
Chloride: 104 mmol/L (ref 98–111)
Creatinine, Ser: 0.94 mg/dL (ref 0.44–1.00)
GFR calc Af Amer: >60 mL/min (ref >60)
GFR calc non Af Amer: >60 mL/min (ref >60)
Glucose, Bld: 86 mg/dL (ref 70–99)
Potassium: 3.9 mmol/L (ref 3.5–5.1)
Sodium: 139 mmol/L (ref 135–145)
Total Bilirubin: 0.5 mg/dL (ref 0.3–1.2)
Total Protein: 6.8 g/dL (ref 6.5–8.1)

## 2019-08-13 LAB — D-DIMER, QUANTITATIVE: D-Dimer, Quant: 0.43 ug/mL-FEU (ref 0.00–0.50)

## 2019-08-13 LAB — LACTATE DEHYDROGENASE: LDH: 173 U/L (ref 98–192)

## 2019-08-13 MED ORDER — HEPARIN SOD (PORK) LOCK FLUSH 100 UNIT/ML IV SOLN
500.0000 [IU] | Freq: Once | INTRAVENOUS | Status: AC | PRN
  Administered 2019-08-13: 500 [IU]
  Filled 2019-08-13: qty 5

## 2019-08-13 MED ORDER — ACETAMINOPHEN 500 MG PO TABS
ORAL_TABLET | ORAL | Status: AC
  Filled 2019-08-13: qty 2

## 2019-08-13 MED ORDER — ACETAMINOPHEN 325 MG PO TABS
650.0000 mg | ORAL_TABLET | Freq: Once | ORAL | Status: AC
  Administered 2019-08-13: 650 mg via ORAL

## 2019-08-13 MED ORDER — SODIUM CHLORIDE 0.9 % IV SOLN
INTRAVENOUS | Status: DC
  Administered 2019-08-13: 10:00:00 via INTRAVENOUS
  Filled 2019-08-13: qty 250

## 2019-08-13 MED ORDER — DEXAMETHASONE SODIUM PHOSPHATE 10 MG/ML IJ SOLN
10.0000 mg | Freq: Once | INTRAMUSCULAR | Status: AC
  Administered 2019-08-13: 10 mg via INTRAVENOUS

## 2019-08-13 MED ORDER — SODIUM CHLORIDE 0.9 % IV SOLN
1000.0000 mg | Freq: Once | INTRAVENOUS | Status: AC
  Administered 2019-08-13: 1000 mg via INTRAVENOUS
  Filled 2019-08-13: qty 40

## 2019-08-13 MED ORDER — ACETAMINOPHEN 325 MG PO TABS
ORAL_TABLET | ORAL | Status: AC
  Filled 2019-08-13: qty 2

## 2019-08-13 MED ORDER — DEXAMETHASONE SODIUM PHOSPHATE 10 MG/ML IJ SOLN
INTRAMUSCULAR | Status: AC
  Filled 2019-08-13: qty 1

## 2019-08-13 MED ORDER — DIPHENHYDRAMINE HCL 50 MG/ML IJ SOLN
25.0000 mg | Freq: Once | INTRAMUSCULAR | Status: AC
  Administered 2019-08-13: 25 mg via INTRAVENOUS

## 2019-08-13 MED ORDER — DIPHENHYDRAMINE HCL 50 MG/ML IJ SOLN
INTRAMUSCULAR | Status: AC
  Filled 2019-08-13: qty 1

## 2019-08-13 MED ORDER — VENETOCLAX 100 MG PO TABS: 200.0000 mg | ORAL_TABLET | Freq: Every day | ORAL | 2 refills | Status: DC

## 2019-08-13 MED ORDER — SODIUM CHLORIDE 0.9% FLUSH
10.0000 mL | INTRAVENOUS | Status: DC | PRN
  Administered 2019-08-13: 10 mL
  Filled 2019-08-13: qty 10

## 2019-08-13 NOTE — Progress Notes (Signed)
[place]  [phone] [phone]    ID: SHARNE BALOUGH   DOB: [birth year]  MR#: [id number]  [id number]  Patient Care Team: Marton Redwood, MD as PCP - General (Internal Medicine) [name], MD as Consulting Physician (Orthopedic Surgery) [name], [name] Dad, MD as Consulting Physician (Oncology) [name], MD as Consulting Physician (General Surgery) , [name], NP as Nurse Practitioner (Hematology and Oncology) OTHER MD:  CHIEF COMPLAINT: Small lymphocytic lymphoma/chronic lymphocytic leukemia  CURRENT TREATMENT: venetoclax, obinutuzumab, [ IVIG ], Lovenox   INTERVAL HISTORY: Assyria returns today for follow-up and treatment of her CLL. She continues on Venetoclax, currently at full dose.  She also receives obinutuzumab every 3 weeks.  She tolerates this well also.  She receives IVIG whenever her total IgG drops near or below 400.  Her counts actually have remained excellent and for now there is no need for this at this point.  Reneasha has DVT, and is on enoxaparin.  A d dimer is pending today and she is scheduled for doppler this afternoon to further evaluate this.    REVIEW OF SYSTEMS: Helia is doing well today.  She has noted an increase in arthralgias.  She thinks it is an adverse effect from the Venetoclax, and also because she hasn't received the dexamethasone with treatments as well.  She also notes that the cold weather is contributing.   She is planning on doing some connective tissue stretching to help with the above.  She was previously going to the senior center, however it is closed due to [place].  Apple had a good thanksgiving.  She is considering a trip to [place] to see her family.    Ocean denies any fever, chills, chest pain, palpitations, cough, shortness of breath, headaches, vision issues, lymphadenopathy, bowel/bladder changes, nausea/vomiting.  A detailed ROS was otherwise  non contributory.    HISTORY OF PRESENT ILLNESS: From the original intake note:  [name] developed what she thought was "the flu" in December of 2002. She noted a large lymph node developing in her right anterior cervical area. 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   Her subsequent history is as detailed above.   PAST MEDICAL HISTORY: Past Medical History:  Diagnosis Date  . Allergy   . Arthritis   . Asthma   . Cancer [facility])    chronic lymphacytic leukemia  . CLL (chronic lymphocytic leukemia) ([place])   . Diabetes mellitus    PMH  . Headache    migraine  . Hyperlipidemia   . Hypertension   . Hypothyroidism   . Lymphoma ([place])   . Thyroid disease    hypothyroidism  . Wears glasses     PAST SURGICAL HISTORY: Past Surgical History:  Procedure Laterality Date  . ABDOMINAL HYSTERECTOMY    . AXILLARY LYMPH NODE BIOPSY Right 08/21/2018   Procedure: RIGHT AXILLARY LYMPH NODE BIOPSY ERAS PATHWAY;  Surgeon: [name], MD;  Location: [facility];  Service: General;  Laterality: Right;  LMA  . BREAST EXCISIONAL BIOPSY Right   . COLONOSCOPY    . IR IMAGING GUIDED PORT INSERTION  11/10/2018  . LYMPH NODE DISSECTION    . STRABISMUS SURGERY    . TONSILLECTOMY    . TOTAL KNEE ARTHROPLASTY Right 12/03/2016   Procedure: RIGHT TOTAL KNEE ARTHROPLASTY;  Surgeon: [name], MD;  Location: [facility];  Service: Orthopedics;  Laterality: Right;    FAMILY HISTORY: Family History  Problem Relation Age of Onset  . Liver cancer Mother   . Heart disease Father   . Breast cancer Sister 20  . Colon cancer Neg Hx   . Pancreatic cancer Neg Hx   . Rectal cancer Neg Hx   . Stomach cancer Neg Hx    The patient's father died at the age of 23 from a myocardial infarction. The patient's mother died at the age of 76 from primary liver carcinoma. The patient had no brothers. Her one sister, [name], has a  history of breast cancer   GYNECOLOGIC HISTORY: Menarche at around age 68. The patient is GX P0. 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 She lives by herself. She had two cats, [name] and Hackberry, but they are currently with her sister and Emmanuela thinks they would be more at home there. She attends a [employer], operational.               ADVANCED DIRECTIVES: Completed 02/03/2019. She has named her niece her 68   HEALTH MAINTENANCE: Social History   Socioeconomic History  . Marital status: Single    Spouse name: Not on file  . Number of children: Not on file  . Years of education: Not on file  . Highest education level: Not on file  Occupational History  . Not on file  Social Needs  . Financial resource strain: Not hard at all  . Food insecurity    Worry: Never true    Inability: Never true  . Transportation needs    Medical: No    Non-medical: No  Tobacco Use  . Smoking status: Never Smoker  . Smokeless tobacco: Never Used  Substance and Sexual Activity  . Alcohol use: Yes    Comment: occasional alcohol intake; once or twice monthly  . Drug use: No  . Sexual activity: Not on file  Lifestyle  . Physical activity    Days per week: 0 days    Minutes per session: 0 min  . Stress: Not at all  Relationships  . Social Herbalist on phone: Once a week    Gets together: Once a week    Attends religious service: Never    Active member of club or organization: No    Attends meetings of clubs or organizations: Never    Relationship status: Never married  . Intimate partner violence    Fear of current or ex partner: No    Emotionally abused: No    Physically abused: No    Forced sexual activity: No  Other Topics Concern  . Not on file  Social History Narrative  . Not on file                 Colonoscopy: 06/01/2014/[facility]             PAP:             Bone density:             Lipid panel:  228/114/50/155/ratio 4.6  On 10/01/2016  ALLERGIES: Allergies  Allergen Reactions  . Iohexol Swelling and Rash  . Compazine [Prochlorperazine Edisylate] Other (See Comments)    Mild confusion  . Contrast Media  [Iodinated Diagnostic Agents]   . Lactose Intolerance (Gi)     Diarrhea, gas bloating  . Cephalexin  Rash    CURRENT MEDICATIONS: Current Outpatient Medications  Medication Sig Dispense Refill  . acetaminophen (TYLENOL 8 HOUR ARTHRITIS PAIN) 650 MG CR tablet Take 650 mg by mouth every 8 (eight) hours as needed for pain.    Marland Kitchen acyclovir (ZOVIRAX) 200 MG capsule TAKE 1 CAPSULE (200 MG TOTAL) BY MOUTH 3 (THREE) TIMES DAILY. 270 capsule 1  . allopurinol (ZYLOPRIM) 300 MG tablet TAKE 1 TABLET BY MOUTH EVERY DAY 90 tablet 1  . enoxaparin (LOVENOX) 30 MG/0.3ML injection Inject 0.9 mLs (90 mg total) into the skin daily. 30 mL 4  . furosemide (LASIX) 20 MG tablet TAKE 1 TABLET BY MOUTH EVERY DAY 90 tablet 1  . KLOR-CON M20 20 MEQ tablet TAKE 1 TABLET BY MOUTH EVERY DAY 30 tablet 0  . ondansetron (ZOFRAN) 8 MG tablet Take 0.5-1 tablets (4-8 mg total) by mouth every 8 (eight) hours as needed for nausea or vomiting. 30 tablet 0  . rosuvastatin (CRESTOR) 10 MG tablet Take 10 mg by mouth at bedtime.  11  . SYNTHROID 88 MCG tablet Take 88 mcg by mouth daily before breakfast.     . venetoclax 100 MG TABS Take 400 mg by mouth daily. Take with food and water at approximately the same time each day. 120 tablet 2  . Zoledronic Acid (RECLAST IV) Inject into the vein. Once a year     No current facility-administered medications for this visit.     OBJECTIVE:   Vitals:   08/13/19 0833  BP: (!) 159/66  Pulse: 69  Resp: 18  Temp: 98 F (36.7 C)  SpO2: 100%   Wt Readings from Last 3 Encounters:  08/13/19 182 lb 3.2 oz (82.6 kg)  08/05/19 182 lb 12.8 oz (82.9 kg)  07/09/19 177 lb  3.2 oz (80.4 kg)   Body mass index is 29.41 kg/m.    ECOG FS:1 - Symptomatic but completely ambulatory GENERAL: Patient is a well appearing female in no acute distress HEENT:  Sclerae anicteric.  Oropharynx clear and moist. No ulcerations or evidence of oropharyngeal candidiasis. Neck is supple.  NODES:  No cervical, supraclavicular, or axillary lymphadenopathy palpated.  LUNGS:  Clear to auscultation bilaterally.  No wheezes or rhonchi. HEART:  Regular rate and rhythm. No murmur appreciated. ABDOMEN:  Soft, nontender.  Positive, normoactive bowel sounds. No organomegaly palpated. MSK:  No focal spinal tenderness to palpation. Full range of motion bilaterally in the upper extremities. EXTREMITIES:  No peripheral edema.   SKIN:  Clear with no obvious rashes or skin changes. No nail dyscrasia. NEURO:  Nonfocal. Well oriented.  Appropriate affect.     LAB RESULTS: Lab Results  Component Value Date   WBC 2.1 (L) 08/13/2019   NEUTROABS 0.8 (L) 08/13/2019   HGB 13.0 08/13/2019   HCT 40.5 08/13/2019   MCV 88.0 08/13/2019   PLT 111 (L) 08/13/2019   CMP Latest Ref Rng & Units 08/13/2019 08/05/2019 07/24/2019  Glucose 70 - 99 mg/dL 86 104(H) 96  BUN 8 - 23 mg/dL 17 15 17   Creatinine 0.44 - 1.00 mg/dL 0.94 0.83 0.90  Sodium 135 - 145 mmol/L 139 140 140  Potassium 3.5 - 5.1 mmol/L 3.9 4.1 4.0  Chloride 98 - 111 mmol/L 104 107 106  CO2 22 - 32 mmol/L 27 25 24   Calcium 8.9 - 10.3 mg/dL 9.5 9.1 9.4  Total Protein 6.5 - 8.1 g/dL 6.8 6.3(L) 6.6  Total Bilirubin 0.3 - 1.2 mg/dL 0.5 0.4 0.4  Alkaline Phos 38 - 126  U/L 185(H) 171(H) 120  AST 15 - 41 U/L 36 46(H) 23  ALT 0 - 44 U/L 30 40 20    No results for input(s): LABCA2 in the last 72 hours.   STUDIES: No results found.   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             (b) rituximab added to ibrutinib, first dose 08/27/2018             (c) hepatitis B studies 08/27/2018 negative             (f) ibrutinib/rituximab discontinued after 10/02/2018 dose w/o obvious response  (6) bendamustine/rituximab started 10/22/2018, discontinued after 1 cycle with rapid progression  (7) obinutuzumab/Gaziva started 11/11/2018  (a) second dose given 11/19/2018 together with chemotherapy  (b) third dose given 12/26/2018 and continuing every 3 weeks  (8) CH[O]P chemotherapy started 11/19/2018, completed 8 doses 05/19/2019             (a) echocardiogram on 11/07/2018 shows EF of 55-60%  (b) second cycle of CH[O]P postponed because of pandemic, given 12/26/2018, greatly reduced doses  (c) cycle 3 of CH[O]P/obinutuzumab given 02/03/2019 at increased doses  (d) cycle 4 and 5 of CH[O]P given on 6/16 and 7/7 at increased doses of 35mg /m2 and 550mg /m2  (e) Repeat echo on 04/22/2019 shows an EF of 60-65%  (9) severe immunocompromise: Marked hypogammaglobulinemia  (a) starting March 2020 she receives IVIG every 8 weeks as needed  (10) 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  (a) LMWH started  04/23/2019  (b) 75% dose reduction September 2020 secondary to bruising  (11) started venetoclax ramp-up 06/11/2019, progressed to 400 mg/d dose w/o event  (a) she is at 400mg /d  PLAN:   Duchess is doing well today.  Her ANC is 0.8 today.  She continues on Venetoclax full dose at 400mg  per day, and she is due for obinotuzumab today.  Her last IGG levels were around 600 and are pending today.  She will not need IVIG today.    I reviewed her labs with Dr. Jana Hakim and he recommends dose reducing her Venetoclax to 200mg /day.  I made those changes today.  Her last PET scan was in 10/2018 and we will repeat this before the end of the year.    Nunzio Cory and I discussed pandemic precautions.  I recommended she be very cautious if she decides to travel because of her risk.  We will see Lalana back on 09/09/2019 for labs, f/u and her  next treatment.  She was recommended to continue with the appropriate pandemic precautions. She knows to call for any questions that may arise between now and her next appointment.  We are happy to see her sooner if needed.  A total of (30) minutes of face-to-face time was spent with this patient with greater than 50% of that time in counseling and care-coordination.  Wilber Bihari, NP 08/13/19 8:53 AM Medical Oncology and Hematology [facility] [place], [place] [address] Tel. [phone]    Fax. [phone]

## 2019-08-13 NOTE — Patient Instructions (Signed)
[facility] Discharge Instructions for Patients Receiving Chemotherapy  Today you received the following chemotherapy agents: Gazyva   To help prevent nausea and vomiting after your treatment, we encourage you to take your nausea medication as directed.    If you develop nausea and vomiting that is not controlled by your nausea medication, call the clinic.   BELOW ARE SYMPTOMS THAT SHOULD BE REPORTED IMMEDIATELY:  *FEVER GREATER THAN 100.5 F  *CHILLS WITH OR WITHOUT FEVER  NAUSEA AND VOMITING THAT IS NOT CONTROLLED WITH YOUR NAUSEA MEDICATION  *UNUSUAL SHORTNESS OF BREATH  *UNUSUAL BRUISING OR BLEEDING  TENDERNESS IN MOUTH AND THROAT WITH OR WITHOUT PRESENCE OF ULCERS  *URINARY PROBLEMS  *BOWEL PROBLEMS  UNUSUAL RASH Items with * indicate a potential emergency and should be followed up as soon as possible.  Feel free to call the clinic should you have any questions or concerns. The clinic phone number is [phone].  Please show the CHEMO ALERT CARD at check-in to the Emergency Department and triage nurse.   

## 2019-08-13 NOTE — Progress Notes (Signed)
I called Maria [name] and left her message unfortunately was unable to reach her.  She still has plenty of clot in place.  I think the safest thing would do to continue anticoagulation but we can certainly do it at a lower dose.  She will go on rivaroxaban 10 mg daily for a minimum of another 6 months.

## 2019-08-13 NOTE — Progress Notes (Signed)
Lower extremity venous has been completed.   Preliminary results in CV Proc.   Abram Sander 08/13/2019 3:09 PM

## 2019-08-14 ENCOUNTER — Telehealth: Payer: Self-pay | Admitting: Oncology

## 2019-08-14 LAB — IGG, IGA, IGM
IgA: 27 mg/dL — ABNORMAL LOW (ref 87–352)
IgG (Immunoglobin G), Serum: 618 mg/dL (ref 586–1602)
IgM (Immunoglobulin M), Srm: <5 mg/dL — ABNORMAL LOW (ref 26–217)

## 2019-08-14 NOTE — Telephone Encounter (Signed)
I talk with patient regarding schedule  

## 2019-08-26 ENCOUNTER — Other Ambulatory Visit: Payer: Self-pay | Admitting: Oncology

## 2019-08-26 ENCOUNTER — Telehealth: Payer: Self-pay | Admitting: *Deleted

## 2019-08-26 MED ORDER — RIVAROXABAN (XARELTO) VTE STARTER PACK (15 & 20 MG): ORAL_TABLET | ORAL | 0 refills | Status: DC

## 2019-08-26 NOTE — Telephone Encounter (Signed)
Thank you Dr. Jana Hakim. I have verified the prescription was not approved.

## 2019-08-26 NOTE — Telephone Encounter (Signed)
This RN spoke with pt per note from Triage nurse per refill of lovenox and possible use of xarelto.  Tuwanda states she was going to finish out her current supply of lovenox injections and then go to the Xarelto.  She is not scheduled to come in to the office until 12/30 and lovenox supply will be completed before then,  This RN informed pt prescription for xarelto will be sent to pharmacy for pick up when she complete the lovenox.

## 2019-08-26 NOTE — Telephone Encounter (Signed)
She still has 2 weeks of lovenox; she will get the rivaroxaban filled and let us know if there is any insurance coverage concern. So let's not refill the lovenox  Thanks!  GM

## 2019-08-26 NOTE — Telephone Encounter (Signed)
Per the last note from Dr. Jana Hakim patient will take Xarelto (Rivaroxaban) but this is not on her medication list. The enoxaparin is still on her list. Wanted to double check before I sent this refill. Thanks, Clarise Cruz

## 2019-08-31 ENCOUNTER — Other Ambulatory Visit: Payer: Self-pay

## 2019-08-31 ENCOUNTER — Encounter (HOSPITAL_COMMUNITY)
Admission: RE | Admit: 2019-08-31 | Discharge: 2019-08-31 | Disposition: A | Discharge status: Home / Self Care | Payer: Medicare Other | Source: Ambulatory Visit | Attending: Adult Health | Admitting: Adult Health

## 2019-08-31 DIAGNOSIS — I700 Atherosclerosis of aorta: Secondary | ICD-10-CM | POA: Diagnosis not present

## 2019-08-31 DIAGNOSIS — J320 Chronic maxillary sinusitis: Secondary | ICD-10-CM | POA: Diagnosis not present

## 2019-08-31 DIAGNOSIS — I2510 Atherosclerotic heart disease of native coronary artery without angina pectoris: Secondary | ICD-10-CM | POA: Diagnosis not present

## 2019-08-31 DIAGNOSIS — C9110 Chronic lymphocytic leukemia of B-cell type not having achieved remission: Secondary | ICD-10-CM | POA: Diagnosis not present

## 2019-08-31 DIAGNOSIS — K8020 Calculus of gallbladder without cholecystitis without obstruction: Secondary | ICD-10-CM | POA: Diagnosis not present

## 2019-08-31 DIAGNOSIS — K5730 Diverticulosis of large intestine without perforation or abscess without bleeding: Secondary | ICD-10-CM | POA: Diagnosis not present

## 2019-08-31 LAB — GLUCOSE, CAPILLARY: Glucose-Capillary: 108 mg/dL — ABNORMAL HIGH (ref 70–99)

## 2019-08-31 MED ORDER — FLUDEOXYGLUCOSE F - 18 (FDG) INJECTION
9.3000 | Freq: Once | INTRAVENOUS | Status: AC | PRN
  Administered 2019-08-31: 9.3 via INTRAVENOUS

## 2019-09-01 ENCOUNTER — Telehealth: Payer: Self-pay | Admitting: Adult Health

## 2019-09-01 NOTE — Telephone Encounter (Signed)
Called patient and [facility] about PET scan results.  Asked her to call us back.  Wilber Bihari, NP

## 2019-09-08 NOTE — Progress Notes (Signed)
Maria [name]  [phone] [phone]    ID: Maria [name]   DOB: 12-25-1950  MR#: [id number]  [id number]  Patient Care Team: Marton Redwood, MD as PCP - General (Internal Medicine) [name], MD as Consulting Physician (Orthopedic Surgery) Dannah Ryles, [name] Dad, MD as Consulting Physician (Oncology) [name], MD as Consulting Physician (General Surgery) Causey, [name], NP as Nurse Practitioner (Hematology and Oncology) OTHER MD:  CHIEF COMPLAINT: Small lymphocytic lymphoma/chronic lymphocytic leukemia  CURRENT TREATMENT: venetoclax, rivaroxaban   INTERVAL HISTORY: Maria [name] returns today for follow-up and treatment of her CLL.   She continues on Venetoclax, currently at 200 mg/day.  She also receives obinutuzumab every 3 weeks.  She has a dose due today.  She has no side effects from this that she is aware of.  She receives IVIG whenever her total IgG drops near or below 400.  Her counts actually have remained excellent and for now this requires only further monitoring.  Maria [name] will complete her supply of Lovenox today.. A repeat left doppler study performed on 08/13/2019 showed persistent DVT involving the left common femoral vein, femoral vein, proximal profunda vein, popliteal vein, and posterior tibial veins.  She will start rivaroxaban tomorrow and continue that at least through February at which time we will reassess.  Since her last visit, she underwent restaging PET scan on 08/31/2019. This revealed: A complete metabolic response (Deuville 2), with residual low level activity within central left mesenteric, retroperitoneal and left pelvic sidewall lymph nodes; no new or progressive findings.    REVIEW OF SYSTEMS: Maria [name] has an excellent functional status, essentially back to baseline.  She notes no palpable adenopathy, no fever, no rash, no weight loss, no fatigue, no drenching sweats or fever, and  essentially no "B" symptoms.  She has had no bleeding or significant bruising from the anticoagulation.  A detailed review of systems today was otherwise stable.    HISTORY OF PRESENT ILLNESS: From the original intake note:  Maria [name] developed what she thought was "the flu" in December of 2002. She noted a large lymph node developing in her right anterior cervical area. 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   Her subsequent history is as detailed above.   PAST MEDICAL HISTORY: Past Medical History:  Diagnosis Date  . Allergy   . Arthritis   . Asthma   . Cancer [facility])    chronic lymphacytic leukemia  . CLL (chronic lymphocytic leukemia) ([place])   . Diabetes mellitus    PMH  . Headache    migraine  . Hyperlipidemia   . Hypertension   . Hypothyroidism   . Lymphoma ([place])   . Thyroid disease    hypothyroidism  . Wears glasses     PAST SURGICAL HISTORY: Past Surgical History:  Procedure Laterality Date  . ABDOMINAL HYSTERECTOMY    . AXILLARY LYMPH NODE BIOPSY Right 08/21/2018   Procedure: RIGHT AXILLARY LYMPH NODE BIOPSY ERAS PATHWAY;  Surgeon: [name], MD;  Location: [facility];  Service: General;  Laterality: Right;  LMA  . BREAST EXCISIONAL BIOPSY Right   . COLONOSCOPY    . IR IMAGING GUIDED PORT INSERTION  11/10/2018  . LYMPH NODE DISSECTION    . STRABISMUS SURGERY    . TONSILLECTOMY    . TOTAL KNEE ARTHROPLASTY Right 12/03/2016   Procedure: RIGHT TOTAL KNEE ARTHROPLASTY;  Surgeon: [name], MD;  Location: [facility];  Service: Orthopedics;  Laterality: Right;    FAMILY HISTORY: Family History  Problem Relation Age of Onset  . Liver cancer Mother   . Heart disease Father   . Breast cancer Sister 6  . Colon cancer Neg Hx   . Pancreatic cancer Neg Hx   . Rectal cancer Neg Hx   . Stomach cancer Neg Hx    The patient's father died at the age of 36 from a myocardial infarction. The  patient's mother died at the age of 38 from primary liver carcinoma. The patient had no brothers. Her one sister, Maria [name], has a history of breast cancer   GYNECOLOGIC HISTORY: Menarche at around age 63. The patient is GX P0. 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 She lives by herself. She had two cats, Maria [name] and Maria [name], but they are currently with her sister and Maria [name] thinks they would be more at home there. She attends a Investment [employer], operational.               ADVANCED DIRECTIVES: Completed 02/03/2019. She has named her niece as her 53.   HEALTH MAINTENANCE: Social History   Socioeconomic History  . Marital status: Single    Spouse name: Not on file  . Number of children: Not on file  . Years of education: Not on file  . Highest education level: Not on file  Occupational History  . Not on file  Tobacco Use  . Smoking status: Never Smoker  . Smokeless tobacco: Never Used  Substance and Sexual Activity  . Alcohol use: Yes    Comment: occasional alcohol intake; once or twice monthly  . Drug use: No  . Sexual activity: Not on file  Other Topics Concern  . Not on file  Social History Narrative  . Not on file   Social Determinants of Health   Financial Resource Strain: Low Risk   . Difficulty of Paying Living Expenses: Not hard at all  Food Insecurity: No Food Insecurity  . Worried About Charity fundraiser in the Last Year: Never true  . Ran Out of Food in the Last Year: Never true  Transportation Needs: No Transportation Needs  . Lack of Transportation (Medical): No  . Lack of Transportation (Non-Medical): No  Physical Activity: Inactive  . Days of Exercise per Week: 0 days  . Minutes of Exercise per Session: 0 min  Stress: No Stress Concern Present  . Feeling of Stress : Not at all  Social  Connections: Severely Isolated  . Frequency of Communication with Friends and Family: Once a week  . Frequency of Social Gatherings with Friends and Family: Once a week  . Attends Religious Services: Never  . Active Member of Clubs or Organizations: No  . Attends Archivist Meetings: Never  . Marital Status: Never married  Intimate Partner Violence: Not At Risk  . Fear of Current or Ex-Partner: No  . Emotionally Abused: No  . Physically Abused: No  . Sexually Abused: No                Colonoscopy: 06/01/2014/[facility]             PAP:             Bone density:             Lipid panel:  228/114/50/155/ratio 4.6  On 10/01/2016  ALLERGIES: Allergies  Allergen Reactions  . Iohexol Swelling and Rash  . Compazine [Prochlorperazine Edisylate] Other (See Comments)    Mild confusion  . Contrast Media  [Iodinated Diagnostic Agents]   . Lactose Intolerance (Gi)     Diarrhea, gas bloating  . Cephalexin Rash    CURRENT MEDICATIONS: Current Outpatient Medications  Medication Sig Dispense Refill  . acetaminophen (TYLENOL 8 HOUR ARTHRITIS PAIN) 650 MG CR tablet Take 650 mg by mouth every 8 (eight) hours as needed for pain.    Marland Kitchen acyclovir (ZOVIRAX) 200 MG capsule TAKE 1 CAPSULE (200 MG TOTAL) BY MOUTH 3 (THREE) TIMES DAILY. 270 capsule 1  . allopurinol (ZYLOPRIM) 300 MG tablet TAKE 1 TABLET BY MOUTH EVERY DAY 90 tablet 1  . doxycycline (VIBRA-TABS) 100 MG tablet TAKE 1 TABLET BY MOUTH EVERY DAY 60 tablet 2  . enoxaparin (LOVENOX) 30 MG/0.3ML injection Inject 0.9 mLs (90 mg total) into the skin daily. 30 mL 4  . furosemide (LASIX) 20 MG tablet TAKE 1 TABLET BY MOUTH EVERY DAY 90 tablet 1  . KLOR-CON M20 20 MEQ tablet TAKE 1 TABLET BY MOUTH EVERY DAY 30 tablet 0  . ondansetron (ZOFRAN) 8 MG tablet Take 0.5-1 tablets (4-8 mg total) by mouth every 8 (eight) hours as needed for nausea or vomiting. 30 tablet 0  . Rivaroxaban 15 & 20 MG TBPK Follow package directions: Take one 15mg  tablet  by mouth twice a day. On day 22, switch to one 20mg  tablet once a day. Take with food. 51 each 0  . rosuvastatin (CRESTOR) 10 MG tablet Take 10 mg by mouth at bedtime.  11  . SYNTHROID 88 MCG tablet Take 88 mcg by mouth daily before breakfast.     . venetoclax 100 MG TABS Take 200 mg by mouth daily. Take with food and water at approximately the same time each day. 120 tablet 2  . Zoledronic Acid (RECLAST IV) Inject into the vein. Once a year     No current facility-administered medications for this visit.    OBJECTIVE: Middle-aged white woman in no acute distress  Vitals:   09/09/19 0925  BP: (!) 146/51  [name]: 72  Resp: 18  Temp: 98.2 F (36.8 C)  SpO2: 99%   Wt Readings from Last 3 Encounters:  09/09/19 184 lb (83.5 kg)  08/13/19 182 lb 3.2 oz (82.6 kg)  08/05/19 182 lb 12.8 oz (82.9 kg)   Body mass index is 29.7 kg/m.    ECOG FS:1 - Symptomatic but completely ambulatory  Sclerae unicteric, EOMs intact Wearing a mask No cervical or supraclavicular adenopathy, no axillary or inguinal adenopathy Lungs no rales or rhonchi Heart regular rate and rhythm Abd soft, nontender, positive bowel sounds MSK no focal spinal tenderness, no upper extremity lymphedema Neuro: nonfocal, well oriented, appropriate affect Breasts: Deferred   LAB RESULTS: Lab Results  Component Value Date   WBC 3.1 (L) 09/09/2019   NEUTROABS 1.5 (L) 09/09/2019   HGB 13.2 09/09/2019   HCT 41.4 09/09/2019   MCV 87.5 09/09/2019   PLT 99 (L) 09/09/2019   CMP Latest Ref Rng & Units 09/09/2019 08/13/2019 08/05/2019  Glucose 70 - 99 mg/dL 102(H) 86 104(H)  BUN 8 - 23 mg/dL 19 17 15   Creatinine 0.44 - 1.00 mg/dL 0.86 0.94 0.83  Sodium 135 - 145 mmol/L 139 139 140  Potassium 3.5 - 5.1 mmol/L 4.4 3.9 4.1  Chloride 98 - 111 mmol/L 105 104 107  CO2 22 - 32  mmol/L 23 27 25   Calcium 8.9 - 10.3 mg/dL 9.8 9.5 9.1  Total Protein 6.5 - 8.1 g/dL 6.5 6.8 6.3(L)  Total Bilirubin 0.3 - 1.2 mg/dL 0.5 0.5 0.4    Alkaline Phos 38 - 126 U/L 171(H) 185(H) 171(H)  AST 15 - 41 U/L 28 36 46(H)  ALT 0 - 44 U/L 25 30 40    No results for input(s): LABCA2 in the last 72 hours.   STUDIES: NM PET Image Restag (PS) Skull Base To Thigh  Result Date: 08/31/2019 CLINICAL DATA:  Subsequent treatment strategy for relapsed/refractory CLL status post Venetoclax therapy. EXAM: NUCLEAR MEDICINE PET SKULL BASE TO THIGH TECHNIQUE: 9.3 mCi F-18 FDG was injected intravenously. Full-ring PET imaging was performed from the skull base to thigh after the radiotracer. CT data was obtained and used for attenuation correction and anatomic localization. Fasting blood glucose: 108 mg/dl COMPARISON:  10/29/2018 PET-CT. FINDINGS: Mediastinal blood pool activity: SUV max 2.6 Liver activity: SUV max 4.0 NECK: No new or residual enlarged or hypermetabolic lymph nodes in the neck. Incidental CT findings: Chronic mucoperiosteal thickening in the left maxillary sinus. Right internal jugular Port-A-Cath terminates in the lower third of the SVC. CHEST: No new or residual enlarged or hypermetabolic axillary, mediastinal or hilar lymph nodes. No hypermetabolic pulmonary findings. Incidental CT findings: Coronary atherosclerosis. Atherosclerotic nonaneurysmal thoracic aorta. Surgical clips again noted in the left axilla. Tiny 2 mm right middle lobe solid pulmonary nodule (series 8/image 52), stable, presumably benign, below PET resolution. Stable mosaic attenuation both lungs. No acute consolidative airspace disease, lung masses or new significant pulmonary nodules. ABDOMEN/PELVIS: Near complete response with substantial reduction in the size and metabolic activity of the previously visualized mesenteric, retroperitoneal and bilateral pelvic adenopathy. Residual low level activity associated with mildly enlarged central left mesenteric lymph nodes measuring up to 1.3 cm with max SUV 2.6 (series 4/image 146), previously 4.3 cm with max SUV 6.7. Residual  low level activity associated with a 0.9 cm anterior aortocaval node with max SUV 2.9 (series 4/image 141), previously 2.3 cm with max SUV 7.1. Residual low level activity associated with a 0.8 cm left pelvic sidewall node with max SUV 2.5, previously 1.8 cm with max SUV 7.1. No newly enlarged or newly hypermetabolic abdominopelvic nodes. No abnormal hypermetabolic activity within the liver, pancreas, adrenal glands, or spleen. Incidental CT findings: Cholelithiasis. Vague hypodense 1.8 cm and 1.6 cm posterior right liver lesions (series 4/image 127) and subcentimeter posterior right liver lesion (series 4/image 111), non hypermetabolic, stable in size, presumably benign. Mild left colonic diverticulosis. Hysterectomy. Atherosclerotic nonaneurysmal abdominal aorta. SKELETON: No focal hypermetabolic activity to suggest skeletal metastasis. Incidental CT findings: none IMPRESSION: 1. Near complete metabolic response, with residual low level activity within central left mesenteric, retroperitoneal and left pelvic sidewall lymph nodes as detailed. Deauville score 2. No new or progressive findings. No splenic hypermetabolism. 2. Chronic findings include: Aortic Atherosclerosis (ICD10-I70.0). Chronic left maxillary sinusitis. Coronary atherosclerosis. Cholelithiasis. Mild left colonic diverticulosis. Electronically Signed   By: Ilona Sorrel M.D.   On: 08/31/2019 18:01   VAS [place] LOWER EXTREMITY VENOUS (DVT)  Result Date: 08/13/2019  Lower Venous Study Indications: Follow up dvt.  Comparison Study: 04/23/19 previous Performing Technologist: Abram Sander RVS  Examination Guidelines: A complete evaluation includes B-mode imaging, spectral Doppler, color Doppler, and power Doppler as needed of all accessible portions of each vessel. Bilateral testing is considered an integral part of a complete examination. Limited examinations for reoccurring indications may be performed as noted.   +-----+---------------+---------+-----------+----------+--------------+  RIGHTCompressibilityPhasicitySpontaneityPropertiesThrombus Aging +-----+---------------+---------+-----------+----------+--------------+ CFV  Full           Yes      Yes                                 +-----+---------------+---------+-----------+----------+--------------+   +---------+---------------+---------+-----------+----------+--------------+ LEFT     CompressibilityPhasicitySpontaneityPropertiesThrombus Aging +---------+---------------+---------+-----------+----------+--------------+ CFV      Partial        Yes      Yes                                 +---------+---------------+---------+-----------+----------+--------------+ SFJ      Full                                                        +---------+---------------+---------+-----------+----------+--------------+ FV Prox  Partial                                                     +---------+---------------+---------+-----------+----------+--------------+ FV Mid   None                                                        +---------+---------------+---------+-----------+----------+--------------+ FV DistalNone                                                        +---------+---------------+---------+-----------+----------+--------------+ PFV      Partial                                                     +---------+---------------+---------+-----------+----------+--------------+ POP      None           No       No                                  +---------+---------------+---------+-----------+----------+--------------+ PTV      None                                                        +---------+---------------+---------+-----------+----------+--------------+ PERO                                                  Not visualized  +---------+---------------+---------+-----------+----------+--------------+  Summary: Right: No evidence of deep vein thrombosis in the lower extremity. No indirect evidence of obstruction proximal to the inguinal ligament. Left: Findings consistent with age indeterminate deep vein thrombosis involving the left common femoral vein, left femoral vein, left proximal profunda vein, left popliteal vein, and left posterior tibial veins. No cystic structure found in the popliteal  fossa.  *See table(s) above for measurements and observations. Electronically signed by Servando Snare MD on 08/13/2019 at 32:11:59 PM.    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             (b) rituximab added to ibrutinib, first dose 08/27/2018             (c) hepatitis B studies 08/27/2018 negative             (f) ibrutinib/rituximab discontinued after 10/02/2018 dose w/o obvious response  (6) bendamustine/rituximab started 10/22/2018, discontinued after 1 cycle with rapid progression  (7)  obinutuzumab/Gaziva started 11/11/2018  (a) second dose given 11/19/2018 together with chemotherapy  (b) third dose given 12/26/2018 and continuing every 3 weeks  (c) obinutuzumab discontinued after 09/09/2019 dose, with documentation of complete remission  (8) CH[O]P chemotherapy started 11/19/2018, completed 8 doses 05/19/2019             (a) echocardiogram on 11/07/2018 shows EF of 55-60%  (b) second cycle of CH[O]P postponed because of pandemic, given 12/26/2018, greatly reduced doses  (c) cycle 3 of CH[O]P/obinutuzumab given 02/03/2019 at increased doses  (d) cycle 4 and 5 of CH[O]P given on 6/16 and 7/7 at increased doses of 35mg /m2 and 550mg /m2  (e) Repeat echo on 04/22/2019 shows an EF of 60-65%  (9) severe immunocompromise: Marked hypogammaglobulinemia  (a) starting March 2020 she receives IVIG every 8 weeks as needed  (10) 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  We are going to stop the intravenous antibody and continue the venetoclax at the current dose of 200 mg daily.  She has no side effects from this dose and counts are good, with an ANC of 1.5 normal hemoglobin, and a platelet count of  99,000.  Her IgG has dropped to 490.  We will continue to follow that and replace as needed.  Her last dose was 06/18/2019.  She will continue on allopurinol.  We are switching her to Valtrex for convenience.  She is switching to rivaroxaban and she will let me know if she has any problems from that medication.  I am going to repeat a Doppler ultrasound of her DVT leg on November 05, 2019 and she will  see me that same day.  She knows to call for any other issues that may develop before the next visit.  [name] Dad. Fatime Biswell, MD 09/10/19 10:58 AM Medical Oncology and Hematology [facility] [place], [place] [address] Tel. [phone]    Fax. [phone]   I, Wilburn Mylar, am acting as scribe for Dr. [name] Dad. Petrina Melby.  I, Lurline Del MD, have reviewed the above documentation for accuracy and completeness, and I agree with the above.

## 2019-09-09 ENCOUNTER — Other Ambulatory Visit: Payer: Self-pay

## 2019-09-09 ENCOUNTER — Inpatient Hospital Stay: Payer: Medicare Other

## 2019-09-09 ENCOUNTER — Inpatient Hospital Stay: Payer: Medicare Other | Admitting: Oncology

## 2019-09-09 ENCOUNTER — Telehealth: Payer: Self-pay | Admitting: *Deleted

## 2019-09-09 VITALS — BP 134/54 | HR 70 | Temp 98.5°F | Resp 16

## 2019-09-09 VITALS — BP 146/51 | HR 72 | Temp 98.2°F | Resp 18 | Ht 66.0 in | Wt 184.0 lb

## 2019-09-09 DIAGNOSIS — Z95828 Presence of other vascular implants and grafts: Secondary | ICD-10-CM

## 2019-09-09 DIAGNOSIS — Z5112 Encounter for antineoplastic immunotherapy: Secondary | ICD-10-CM | POA: Diagnosis not present

## 2019-09-09 DIAGNOSIS — C9110 Chronic lymphocytic leukemia of B-cell type not having achieved remission: Secondary | ICD-10-CM

## 2019-09-09 DIAGNOSIS — D801 Nonfamilial hypogammaglobulinemia: Secondary | ICD-10-CM

## 2019-09-09 LAB — COMPREHENSIVE METABOLIC PANEL
ALT: 25 U/L (ref 0–44)
AST: 28 U/L (ref 15–41)
Albumin: 4 g/dL (ref 3.5–5.0)
Alkaline Phosphatase: 171 U/L — ABNORMAL HIGH (ref 38–126)
Anion gap: 11 (ref 5–15)
BUN: 19 mg/dL (ref 8–23)
CO2: 23 mmol/L (ref 22–32)
Calcium: 9.8 mg/dL (ref 8.9–10.3)
Chloride: 105 mmol/L (ref 98–111)
Creatinine, Ser: 0.86 mg/dL (ref 0.44–1.00)
GFR calc Af Amer: >60 mL/min (ref >60)
GFR calc non Af Amer: >60 mL/min (ref >60)
Glucose, Bld: 102 mg/dL — ABNORMAL HIGH (ref 70–99)
Potassium: 4.4 mmol/L (ref 3.5–5.1)
Sodium: 139 mmol/L (ref 135–145)
Total Bilirubin: 0.5 mg/dL (ref 0.3–1.2)
Total Protein: 6.5 g/dL (ref 6.5–8.1)

## 2019-09-09 LAB — CBC WITH DIFFERENTIAL/PLATELET
Abs Immature Granulocytes: 0 10*3/uL (ref 0.00–0.07)
Basophils Absolute: 0 10*3/uL (ref 0.0–0.1)
Basophils Relative: 1 %
Eosinophils Absolute: 0 10*3/uL (ref 0.0–0.5)
Eosinophils Relative: 1 %
HCT: 41.4 % (ref 36.0–46.0)
Hemoglobin: 13.2 g/dL (ref 12.0–15.0)
Immature Granulocytes: 0 %
Lymphocytes Relative: 36 %
Lymphs Abs: 1.1 10*3/uL (ref 0.7–4.0)
MCH: 27.9 pg (ref 26.0–34.0)
MCHC: 31.9 g/dL (ref 30.0–36.0)
MCV: 87.5 fL (ref 80.0–100.0)
Monocytes Absolute: 0.5 10*3/uL (ref 0.1–1.0)
Monocytes Relative: 15 %
Neutro Abs: 1.5 10*3/uL — ABNORMAL LOW (ref 1.7–7.7)
Neutrophils Relative %: 47 %
Platelets: 99 10*3/uL — ABNORMAL LOW (ref 150–400)
RBC: 4.73 MIL/uL (ref 3.87–5.11)
RDW: 14.5 % (ref 11.5–15.5)
WBC: 3.1 10*3/uL — ABNORMAL LOW (ref 4.0–10.5)
nRBC: 0 % (ref 0.0–0.2)

## 2019-09-09 LAB — LACTATE DEHYDROGENASE: LDH: 166 U/L (ref 98–192)

## 2019-09-09 MED ORDER — SODIUM CHLORIDE 0.9 % IV SOLN
1000.0000 mg | Freq: Once | INTRAVENOUS | Status: AC
  Administered 2019-09-09: 1000 mg via INTRAVENOUS
  Filled 2019-09-09: qty 40

## 2019-09-09 MED ORDER — DEXAMETHASONE SODIUM PHOSPHATE 10 MG/ML IJ SOLN
10.0000 mg | Freq: Once | INTRAMUSCULAR | Status: AC
  Administered 2019-09-09: 10 mg via INTRAVENOUS

## 2019-09-09 MED ORDER — DEXAMETHASONE SODIUM PHOSPHATE 10 MG/ML IJ SOLN
INTRAMUSCULAR | Status: AC
  Filled 2019-09-09: qty 1

## 2019-09-09 MED ORDER — DIPHENHYDRAMINE HCL 25 MG PO CAPS
ORAL_CAPSULE | ORAL | Status: AC
  Filled 2019-09-09: qty 1

## 2019-09-09 MED ORDER — HEPARIN SOD (PORK) LOCK FLUSH 100 UNIT/ML IV SOLN
500.0000 [IU] | Freq: Once | INTRAVENOUS | Status: AC | PRN
  Administered 2019-09-09: 500 [IU]
  Filled 2019-09-09: qty 5

## 2019-09-09 MED ORDER — DIPHENHYDRAMINE HCL 50 MG/ML IJ SOLN
INTRAMUSCULAR | Status: AC
  Filled 2019-09-09: qty 1

## 2019-09-09 MED ORDER — SODIUM CHLORIDE 0.9 % IV SOLN
INTRAVENOUS | Status: DC
  Filled 2019-09-09: qty 250

## 2019-09-09 MED ORDER — SODIUM CHLORIDE 0.9% FLUSH
10.0000 mL | Freq: Once | INTRAVENOUS | Status: AC
  Administered 2019-09-09: 10 mL
  Filled 2019-09-09: qty 10

## 2019-09-09 MED ORDER — SODIUM CHLORIDE 0.9% FLUSH
10.0000 mL | INTRAVENOUS | Status: DC | PRN
  Administered 2019-09-09: 10 mL
  Filled 2019-09-09: qty 10

## 2019-09-09 MED ORDER — ACETAMINOPHEN 325 MG PO TABS
650.0000 mg | ORAL_TABLET | Freq: Once | ORAL | Status: AC
  Administered 2019-09-09: 10:00:00 650 mg via ORAL

## 2019-09-09 MED ORDER — ACETAMINOPHEN 325 MG PO TABS
ORAL_TABLET | ORAL | Status: AC
  Filled 2019-09-09: qty 2

## 2019-09-09 MED ORDER — DIPHENHYDRAMINE HCL 50 MG/ML IJ SOLN
25.0000 mg | Freq: Once | INTRAMUSCULAR | Status: AC
  Administered 2019-09-09: 25 mg via INTRAVENOUS

## 2019-09-09 NOTE — Progress Notes (Signed)
Per Dr Jana Hakim OK to proceed with obinutuzumab with PLT of 99

## 2019-09-09 NOTE — Patient Instructions (Signed)
[facility] Discharge Instructions for Patients Receiving Chemotherapy  Today you received the following chemotherapy agents:  Obinutuzumab  To help prevent nausea and vomiting after your treatment, we encourage you to take your nausea medication as prescribed.   If you develop nausea and vomiting that is not controlled by your nausea medication, call the clinic.   BELOW ARE SYMPTOMS THAT SHOULD BE REPORTED IMMEDIATELY:  *FEVER GREATER THAN 100.5 F  *CHILLS WITH OR WITHOUT FEVER  NAUSEA AND VOMITING THAT IS NOT CONTROLLED WITH YOUR NAUSEA MEDICATION  *UNUSUAL SHORTNESS OF BREATH  *UNUSUAL BRUISING OR BLEEDING  TENDERNESS IN MOUTH AND THROAT WITH OR WITHOUT PRESENCE OF ULCERS  *URINARY PROBLEMS  *BOWEL PROBLEMS  UNUSUAL RASH Items with * indicate a potential emergency and should be followed up as soon as possible.  Feel free to call the clinic should you have any questions or concerns. The clinic phone number is [phone].  Please show the [facility] [facility] at check-in to the Emergency Department and triage nurse.

## 2019-09-10 LAB — IGG, IGA, IGM
IgA: 28 mg/dL — ABNORMAL LOW (ref 87–352)
IgG (Immunoglobin G), Serum: 490 mg/dL — ABNORMAL LOW (ref 586–1602)
IgM (Immunoglobulin M), Srm: <5 mg/dL — ABNORMAL LOW (ref 26–217)

## 2019-09-10 MED ORDER — VALACYCLOVIR HCL 1 G PO TABS: 1000.0000 mg | ORAL_TABLET | Freq: Every day | ORAL | 6 refills | Status: DC

## 2019-09-15 ENCOUNTER — Telehealth: Payer: Self-pay | Admitting: Oncology

## 2019-09-15 ENCOUNTER — Other Ambulatory Visit: Payer: Self-pay | Admitting: *Deleted

## 2019-09-15 NOTE — Telephone Encounter (Signed)
Scheduled per 12/30 los, patient has been called and notified.

## 2019-09-23 ENCOUNTER — Other Ambulatory Visit: Payer: Self-pay | Admitting: Oncology

## 2019-09-28 ENCOUNTER — Other Ambulatory Visit: Payer: Self-pay | Admitting: Pharmacist

## 2019-09-28 DIAGNOSIS — C9110 Chronic lymphocytic leukemia of B-cell type not having achieved remission: Secondary | ICD-10-CM

## 2019-09-28 MED ORDER — VENETOCLAX 100 MG PO TABS: 200.0000 mg | ORAL_TABLET | Freq: Every day | ORAL | 2 refills | Status: DC

## 2019-09-28 MED FILL — VENCLEXTA 100 MG TABS: 100 | 30 days supply | Qty: 60 | Fill #0

## 2019-09-30 ENCOUNTER — Other Ambulatory Visit: Payer: Medicare Other

## 2019-09-30 ENCOUNTER — Ambulatory Visit: Payer: Medicare Other

## 2019-09-30 ENCOUNTER — Ambulatory Visit: Payer: Medicare Other | Admitting: Adult Health

## 2019-09-30 NOTE — Telephone Encounter (Signed)
No entry 

## 2019-10-07 ENCOUNTER — Other Ambulatory Visit: Payer: Self-pay | Admitting: *Deleted

## 2019-10-07 MED ORDER — RIVAROXABAN 20 MG PO TABS: 20.0000 mg | ORAL_TABLET | Freq: Every day | ORAL | 3 refills | Status: DC

## 2019-10-24 ENCOUNTER — Other Ambulatory Visit: Payer: Self-pay | Admitting: Oncology

## 2019-10-27 MED FILL — VENCLEXTA 100 MG TABS: 100 | 30 days supply | Qty: 60 | Fill #1

## 2019-11-04 NOTE — Progress Notes (Signed)
[place]  [phone]) [phone] [phone]) [phone]    ID: Maria [name]   DOB: 09-26-1950  MR#: [id number]  [id number]  Patient Care Team: Marton Redwood, MD as PCP - General (Internal Medicine) [name], MD as Consulting Physician (Orthopedic Surgery) Bud Kaeser, [name] Dad, MD as Consulting Physician (Oncology) [name], MD as Consulting Physician (General Surgery) Causey, [name], NP as Nurse Practitioner (Hematology and Oncology) OTHER MD:  CHIEF COMPLAINT: Small lymphocytic lymphoma/chronic lymphocytic leukemia  CURRENT TREATMENT: venetoclax, rivaroxaban   INTERVAL HISTORY: Maria [name] returns today for follow-up and treatment of her CLL.   She continues on Venetoclax, currently at 200 mg/day.  Because of the recent cold spell or other reasons not clear to me [name] we discussed how to proceed from here and was  She receives IVIG whenever her total IgG drops near or below 400.  Her counts actually have remained excellent and for now this requires only further monitoring.  Maria [name] was switched to rivaroxaban for her left lower extremity DVT on 09/10/2019. She underwent repeat doppler study earlier today.  This shows canalization and chronicity.  Likely she has no swelling in her left lower extremity.  This is discussed further below.    REVIEW OF SYSTEMS: Maria [name] has had no fevers rash unusual fatigue weight loss or adenopathy.  She has had no bleeding or bruising from the rivaroxaban.  She exercises by walking.  She is taking appropriate pandemic precautions and has already received the first Covid dose.  She definitely needs to receive both doses but she understands that she is very unlikely to make adequate antibodies and therefore needs to continue masking and safe distancing.  A detailed review of systems today was otherwise stable.    HISTORY OF PRESENT ILLNESS: From the original intake note:  Maria [name] developed what she  thought was "the flu" in December of 2002. She noted a large lymph node developing in her right anterior cervical area. 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   Her subsequent history is as detailed above.   PAST MEDICAL HISTORY: Past Medical History:  Diagnosis Date  . Allergy   . Arthritis   . Asthma   . Cancer [facility])    chronic lymphacytic leukemia  . CLL (chronic lymphocytic leukemia) ([place])   . Diabetes mellitus    PMH  . Headache    migraine  . Hyperlipidemia   . Hypertension   . Hypothyroidism   . Lymphoma ([place])   . Thyroid disease    hypothyroidism  . Wears glasses     PAST SURGICAL HISTORY: Past Surgical History:  Procedure Laterality Date  . ABDOMINAL HYSTERECTOMY    . AXILLARY LYMPH NODE BIOPSY Right 08/21/2018   Procedure: RIGHT AXILLARY LYMPH NODE BIOPSY ERAS PATHWAY;  Surgeon: [name], MD;  Location: [facility];  Service: General;  Laterality: Right;  LMA  . BREAST EXCISIONAL BIOPSY Right   . COLONOSCOPY    . IR IMAGING GUIDED PORT INSERTION  11/10/2018  . LYMPH NODE DISSECTION    . STRABISMUS SURGERY    . TONSILLECTOMY    . TOTAL KNEE ARTHROPLASTY Right 12/03/2016   Procedure: RIGHT TOTAL KNEE ARTHROPLASTY;  Surgeon: [name], MD;  Location: [facility];  Service: Orthopedics;  Laterality: Right;    FAMILY HISTORY: Family History  Problem Relation Age of Onset  . Liver cancer Mother   . Heart disease Father   . Breast cancer Sister  67  . Colon cancer Neg Hx   . Pancreatic cancer Neg Hx   . Rectal cancer Neg Hx   . Stomach cancer Neg Hx    The patient's father died at the age of 12 from a myocardial infarction. The patient's mother died at the age of 65 from primary liver carcinoma. The patient had no brothers. Her one sister, Maria [name], has a history of breast cancer   GYNECOLOGIC HISTORY: Menarche at around age 18. The patient is GX P0. 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 She lives by herself. She had two cats, Maria [name] and Maria [name], but they are currently with her sister and Maria [name]. She attends a Investment [employer], operational.               ADVANCED DIRECTIVES: Completed 02/03/2019. She has named her niece as her 23.   HEALTH MAINTENANCE: Social History   Socioeconomic History  . Marital status: Single    Spouse name: Not on file  . Number of children: Not on file  . Years of education: Not on file  . Highest education level: Not on file  Occupational History  . Not on file  Tobacco Use  . Smoking status: Never Smoker  . Smokeless tobacco: Never Used  Substance and Sexual Activity  . Alcohol use: Yes    Comment: occasional alcohol intake; once or twice monthly  . Drug use: No  . Sexual activity: Not on file  Other Topics Concern  . Not on file  Social History Narrative  . Not on file   Social Determinants of Health   Financial Resource Strain: Low Risk   . Difficulty of Paying Living Expenses: Not hard at all  Food Insecurity: No Food Insecurity  . Worried About Charity fundraiser in the Last Year: Never true  . Ran Out of Food in the Last Year: Never true  Transportation Needs: No Transportation Needs  . Lack of Transportation (Medical): No  . Lack of Transportation (Non-Medical): No  Physical Activity: Inactive  . Days of Exercise per Week: 0 days  . Minutes of Exercise per Session: 0 min  Stress: No Stress Concern Present  . Feeling of Stress : Not at all  Social Connections: Severely Isolated  . Frequency of Communication with Friends and Family: Once a week  . Frequency of Social Gatherings with Friends and Family: Once a week  . Attends Religious Services: Never  . Active Member of Clubs or Organizations: No    . Attends Archivist Meetings: Never  . Marital Status: Never married  Intimate Partner Violence: Not At Risk  . Fear of Current or Ex-Partner: No  . Emotionally Abused: No  . Physically Abused: No  . Sexually Abused: No                Colonoscopy: 06/01/2014/[facility]             PAP:             Bone density:             Lipid panel:  228/114/50/155/ratio 4.6  On 10/01/2016  ALLERGIES: Allergies  Allergen Reactions  . Iohexol Swelling and Rash  . Compazine [Prochlorperazine Edisylate] Other (See Comments)    Mild confusion  . Contrast Media  [  Iodinated Diagnostic Agents]   . Lactose Intolerance (Gi)     Diarrhea, gas bloating  . Cephalexin Rash    CURRENT MEDICATIONS: Current Outpatient Medications  Medication Sig Dispense Refill  . acetaminophen (TYLENOL 8 HOUR ARTHRITIS PAIN) 650 MG CR tablet Take 650 mg by mouth every 8 (eight) hours as needed for pain.    Marland Kitchen allopurinol (ZYLOPRIM) 300 MG tablet TAKE 1 TABLET BY MOUTH EVERY DAY 90 tablet 1  . furosemide (LASIX) 20 MG tablet TAKE 1 TABLET BY MOUTH EVERY DAY 90 tablet 1  . KLOR-CON M20 20 MEQ tablet TAKE 1 TABLET BY MOUTH EVERY DAY 30 tablet 0  . ondansetron (ZOFRAN) 8 MG tablet Take 0.5-1 tablets (4-8 mg total) by mouth every 8 (eight) hours as needed for nausea or vomiting. 30 tablet 0  . rivaroxaban (XARELTO) 20 MG TABS tablet Take 1 tablet (20 mg total) by mouth daily with supper. 30 tablet 3  . rosuvastatin (CRESTOR) 10 MG tablet Take 10 mg by mouth at bedtime.  11  . SYNTHROID 88 MCG tablet Take 88 mcg by mouth daily before breakfast.     . valACYclovir (VALTREX) 1000 MG tablet Take 1 tablet (1,000 mg total) by mouth daily. 90 tablet 6  . venetoclax 100 MG TABS Take 200 mg by mouth daily. Take with food and water at approximately the same time each day. 60 tablet 2  . Zoledronic Acid (RECLAST IV) Inject into the vein. Once a year     No current facility-administered medications for this visit.     OBJECTIVE: Middle-aged white woman who appears younger than stated age  18:   11/05/19 1050  BP: (!) 118/54  [name]: 61  Resp: 18  Temp: 98 F (36.7 C)  SpO2: 100%   Wt Readings from Last 3 Encounters:  11/05/19 184 lb 14.4 oz (83.9 kg)  09/09/19 184 lb (83.5 kg)  08/13/19 182 lb 3.2 oz (82.6 kg)   Body mass index is 29.84 kg/m.    ECOG FS:1 - Symptomatic but completely ambulatory  Sclerae unicteric, EOMs intact Wearing a mask No cervical or supraclavicular adenopathy, no axillary adenopathy Lungs no rales or rhonchi Heart regular rate and rhythm Abd soft, nontender, positive bowel sounds MSK no focal spinal tenderness, no upper extremity lymphedema Neuro: nonfocal, well oriented, appropriate affect Breasts: Deferred   LAB RESULTS: Lab Results  Component Value Date   WBC 3.1 (L) 11/05/2019   NEUTROABS 1.5 (L) 11/05/2019   HGB 13.0 11/05/2019   HCT 39.8 11/05/2019   MCV 87.5 11/05/2019   PLT 109 (L) 11/05/2019   CMP Latest Ref Rng & Units 11/05/2019 09/09/2019 08/13/2019  Glucose 70 - 99 mg/dL 99 102(H) 86  BUN 8 - 23 mg/dL 17 19 17   Creatinine 0.44 - 1.00 mg/dL 0.79 0.86 0.94  Sodium 135 - 145 mmol/L 142 139 139  Potassium 3.5 - 5.1 mmol/L 3.8 4.4 3.9  Chloride 98 - 111 mmol/L 108 105 104  CO2 22 - 32 mmol/L 26 23 27   Calcium 8.9 - 10.3 mg/dL 9.6 9.8 9.5  Total Protein 6.5 - 8.1 g/dL 6.2(L) 6.5 6.8  Total Bilirubin 0.3 - 1.2 mg/dL 0.5 0.5 0.5  Alkaline Phos 38 - 126 U/L 124 171(H) 185(H)  AST 15 - 41 U/L 22 28 36  ALT 0 - 44 U/L 19 25 30     No results for input(s): LABCA2 in the last 72 hours.   STUDIES: VAS [place] LOWER EXTREMITY VENOUS (DVT)  Result Date: 11/05/2019  Lower Venous DVTStudy Indications: Follow up DVT. Original DVT found 04/23/19  Risk Factors: Cancer Small lymphocytic lymphoma. Comparison Study: Prior study from 08/13/19 is available for comparison Performing Technologist: Sharion Dove RVS  Examination Guidelines: A complete  evaluation includes B-mode imaging, spectral Doppler, color Doppler, and power Doppler as needed of all accessible portions of each vessel. Bilateral testing is considered an integral part of a complete examination. Limited examinations for reoccurring indications may be performed as noted. The reflux portion of the exam is performed with the patient in reverse Trendelenburg.  +---------+---------------+---------+-----------+----------+--------------+ LEFT     CompressibilityPhasicitySpontaneityPropertiesThrombus Aging +---------+---------------+---------+-----------+----------+--------------+ CFV      Partial        Yes      Yes                  Chronic        +---------+---------------+---------+-----------+----------+--------------+ SFJ      Full                                                        +---------+---------------+---------+-----------+----------+--------------+ FV Prox  Partial        Yes      Yes                  Chronic        +---------+---------------+---------+-----------+----------+--------------+ FV Mid   Partial                                      Chronic        +---------+---------------+---------+-----------+----------+--------------+ FV Distal               Yes      Yes                  Chronic        +---------+---------------+---------+-----------+----------+--------------+ PFV      Partial        Yes      Yes                  Chronic        +---------+---------------+---------+-----------+----------+--------------+ POP      Partial        No       No                   Chronic        +---------+---------------+---------+-----------+----------+--------------+ PTV      None           No       No                   Chronic        +---------+---------------+---------+-----------+----------+--------------+ PERO                                                  Not visualized  +---------+---------------+---------+-----------+----------+--------------+   +-----+---------------+---------+-----------+----------+--------------+ RIGHTCompressibilityPhasicitySpontaneityPropertiesThrombus Aging +-----+---------------+---------+-----------+----------+--------------+ CFV  Full           Yes      Yes                                 +-----+---------------+---------+-----------+----------+--------------+  Summary: RIGHT: - No evidence of common femoral vein obstruction.  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 - Findings appear improved from previous examination.  *See table(s) above for measurements and observations. Electronically signed by Monica Martinez MD on 11/05/2019 at 1:55:16 PM.    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             (b) rituximab added to ibrutinib, first dose  08/27/2018             (c) hepatitis B studies 08/27/2018 negative             (f) ibrutinib/rituximab discontinued after 10/02/2018 dose w/o obvious response  (6) bendamustine/rituximab started 10/22/2018, discontinued after 1 cycle with rapid progression  (7) obinutuzumab/Gaziva started 11/11/2018  (a) second dose given 11/19/2018 together with chemotherapy  (b) third dose given 12/26/2018 and continuing every 3 weeks  (c) obinutuzumab discontinued after 09/09/2019 dose, with documentation of complete remission  (8) CH[O]P chemotherapy started 11/19/2018, completed 8 doses 05/19/2019             (a) echocardiogram on 11/07/2018 shows EF of 55-60%  (b) second cycle of CH[O]P postponed because of pandemic, given 12/26/2018, greatly reduced doses  (c) cycle 3 of CH[O]P/obinutuzumab given 02/03/2019 at increased doses  (d) cycle 4 and 5 of CH[O]P given on 6/16 and 7/7 at increased doses of 35mg /m2 and 550mg /m2  (e) Repeat echo on 04/22/2019 shows an EF of 60-65%  (9) severe immunocompromise: Marked hypogammaglobulinemia  (a) starting March 2020 she receives IVIG every 8 weeks as needed  (10) 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  (a) LMWH started 04/23/2019  (b) 75% dose reduction September 2020 secondary to bruising  (c) switched to rivaroxaban 09/10/2019  (d) Doppler ultrasonography left lower extremity 11/05/2019 shows chronic DVT   (11) started venetoclax ramp-up 06/11/2019, progressed to 400 mg/d dose w/o event  (a) dose reduced to 200 mg daily 08/05/2019 secondary to cytopenias   PLAN:   Aayat continues in clinical remission on venetoclax and she is tolerating that drug remarkably well.  Hopefully we will be able to obtain it for her tomorrow and get it to her before Monday so [name] is no break in her treatment.  If that is not the case she will let me know.  The question is how long to  continue her anticoagulation.  Even though [name] has been  some canalization of the left lower extremity clot, we can synchronicity as the clot appears to have become epithelialized and part of the vessel wall.  What this means is that she just does not have as good drainage and therefore her risk of developing another clot in the same leg is a bit higher than it would otherwise be.  If she is comfortable with lifetime anticoagulation that would be my recommendation.  I think also she would benefit from compression stockings.  She has rivaroxaban on hand for the next 2 months.  This is pricey.  We can certainly switch to Coumadin which would be less expensive although little bit more of a nuisance at least initially in terms of monitoring.  She is going to think about it and let me know when she returns to see me in approximately 2 months  She will have a repeat PET scan in early December  Total encounter time 35 minutes.Sarajane Jews C. Oronde Hallenbeck, MD 11/05/19 3:02 PM Medical Oncology and Hematology [facility] [place], [place] [address] Tel. [phone]    Fax. [phone]   I, Wilburn Mylar, am acting as scribe for Dr. [name] Dad. Gayle Collard.  I, Lurline Del MD, have reviewed the above documentation for accuracy and completeness, and I agree with the above.   *Total Encounter Time as defined by the Centers for Medicare and Medicaid Services includes, in addition to the face-to-face time of a patient visit (documented in the note above) non-face-to-face time: obtaining and reviewing outside history, ordering and reviewing medications, tests or procedures, care coordination (communications with other health care professionals or caregivers) and documentation in the medical record.

## 2019-11-05 ENCOUNTER — Ambulatory Visit (HOSPITAL_COMMUNITY)
Admission: RE | Admit: 2019-11-05 | Discharge: 2019-11-05 | Disposition: A | Discharge status: Home / Self Care | Payer: Medicare PPO | Source: Ambulatory Visit | Attending: Oncology | Admitting: Oncology

## 2019-11-05 ENCOUNTER — Other Ambulatory Visit: Payer: Self-pay

## 2019-11-05 ENCOUNTER — Inpatient Hospital Stay (HOSPITAL_BASED_OUTPATIENT_CLINIC_OR_DEPARTMENT_OTHER): Payer: Medicare PPO | Admitting: Oncology

## 2019-11-05 ENCOUNTER — Inpatient Hospital Stay: Payer: Medicare PPO

## 2019-11-05 ENCOUNTER — Inpatient Hospital Stay: Payer: Medicare PPO | Attending: Oncology

## 2019-11-05 VITALS — BP 118/54 | HR 61 | Temp 98.0°F | Resp 18 | Ht 66.0 in | Wt 184.9 lb

## 2019-11-05 DIAGNOSIS — D696 Thrombocytopenia, unspecified: Secondary | ICD-10-CM

## 2019-11-05 DIAGNOSIS — I82412 Acute embolism and thrombosis of left femoral vein: Secondary | ICD-10-CM | POA: Diagnosis not present

## 2019-11-05 DIAGNOSIS — C9110 Chronic lymphocytic leukemia of B-cell type not having achieved remission: Secondary | ICD-10-CM | POA: Diagnosis present

## 2019-11-05 DIAGNOSIS — D801 Nonfamilial hypogammaglobulinemia: Secondary | ICD-10-CM

## 2019-11-05 DIAGNOSIS — E7800 Pure hypercholesterolemia, unspecified: Secondary | ICD-10-CM

## 2019-11-05 DIAGNOSIS — E038 Other specified hypothyroidism: Secondary | ICD-10-CM

## 2019-11-05 DIAGNOSIS — Z7901 Long term (current) use of anticoagulants: Secondary | ICD-10-CM | POA: Insufficient documentation

## 2019-11-05 DIAGNOSIS — Z95828 Presence of other vascular implants and grafts: Secondary | ICD-10-CM

## 2019-11-05 LAB — COMPREHENSIVE METABOLIC PANEL
ALT: 19 U/L (ref 0–44)
AST: 22 U/L (ref 15–41)
Albumin: 4 g/dL (ref 3.5–5.0)
Alkaline Phosphatase: 124 U/L (ref 38–126)
Anion gap: 8 (ref 5–15)
BUN: 17 mg/dL (ref 8–23)
CO2: 26 mmol/L (ref 22–32)
Calcium: 9.6 mg/dL (ref 8.9–10.3)
Chloride: 108 mmol/L (ref 98–111)
Creatinine, Ser: 0.79 mg/dL (ref 0.44–1.00)
GFR calc Af Amer: >60 mL/min (ref >60)
GFR calc non Af Amer: >60 mL/min (ref >60)
Glucose, Bld: 99 mg/dL (ref 70–99)
Potassium: 3.8 mmol/L (ref 3.5–5.1)
Sodium: 142 mmol/L (ref 135–145)
Total Bilirubin: 0.5 mg/dL (ref 0.3–1.2)
Total Protein: 6.2 g/dL — ABNORMAL LOW (ref 6.5–8.1)

## 2019-11-05 LAB — CBC WITH DIFFERENTIAL/PLATELET
Abs Immature Granulocytes: 0.01 10*3/uL (ref 0.00–0.07)
Basophils Absolute: 0 10*3/uL (ref 0.0–0.1)
Basophils Relative: 1 %
Eosinophils Absolute: 0 10*3/uL (ref 0.0–0.5)
Eosinophils Relative: 1 %
HCT: 39.8 % (ref 36.0–46.0)
Hemoglobin: 13 g/dL (ref 12.0–15.0)
Immature Granulocytes: 0 %
Lymphocytes Relative: 34 %
Lymphs Abs: 1.1 10*3/uL (ref 0.7–4.0)
MCH: 28.6 pg (ref 26.0–34.0)
MCHC: 32.7 g/dL (ref 30.0–36.0)
MCV: 87.5 fL (ref 80.0–100.0)
Monocytes Absolute: 0.5 10*3/uL (ref 0.1–1.0)
Monocytes Relative: 17 %
Neutro Abs: 1.5 10*3/uL — ABNORMAL LOW (ref 1.7–7.7)
Neutrophils Relative %: 47 %
Platelets: 109 10*3/uL — ABNORMAL LOW (ref 150–400)
RBC: 4.55 MIL/uL (ref 3.87–5.11)
RDW: 16.2 % — ABNORMAL HIGH (ref 11.5–15.5)
WBC: 3.1 10*3/uL — ABNORMAL LOW (ref 4.0–10.5)
nRBC: 0 % (ref 0.0–0.2)

## 2019-11-05 LAB — LACTATE DEHYDROGENASE: LDH: 140 U/L (ref 98–192)

## 2019-11-05 MED ORDER — HEPARIN SOD (PORK) LOCK FLUSH 100 UNIT/ML IV SOLN
500.0000 [IU] | Freq: Once | INTRAVENOUS | Status: AC
  Administered 2019-11-05: 500 [IU]
  Filled 2019-11-05: qty 5

## 2019-11-05 MED ORDER — SODIUM CHLORIDE 0.9% FLUSH
10.0000 mL | Freq: Once | INTRAVENOUS | Status: AC
  Administered 2019-11-05: 10 mL
  Filled 2019-11-05: qty 10

## 2019-11-05 NOTE — Patient Instructions (Signed)

## 2019-11-05 NOTE — Progress Notes (Signed)
VASCULAR LAB PRELIMINARY  PRELIMINARY  PRELIMINARY  PRELIMINARY  Left lower extremity venous duplex completed.    Preliminary report:  See CV proc for preliminary results.  Called Dr. Jana Hakim with results.   Dalisha Shively, RVT 11/05/2019, 9:24 AM

## 2019-11-06 ENCOUNTER — Other Ambulatory Visit: Payer: Self-pay | Admitting: Oncology

## 2019-11-06 ENCOUNTER — Encounter: Payer: Self-pay | Admitting: Oncology

## 2019-11-06 ENCOUNTER — Telehealth: Payer: Self-pay | Admitting: Oncology

## 2019-11-06 LAB — IGG, IGA, IGM
IgA: 22 mg/dL — ABNORMAL LOW (ref 87–352)
IgG (Immunoglobin G), Serum: 334 mg/dL — ABNORMAL LOW (ref 586–1602)
IgM (Immunoglobulin M), Srm: 7 mg/dL — ABNORMAL LOW (ref 26–217)

## 2019-11-06 NOTE — Telephone Encounter (Signed)
I talk with patient regarding schedule  

## 2019-11-09 ENCOUNTER — Telehealth: Payer: Self-pay | Admitting: Oncology

## 2019-11-09 NOTE — Telephone Encounter (Signed)
Scheduled appt per 2/26 sch message - pt aware of new appt time

## 2019-11-19 ENCOUNTER — Other Ambulatory Visit: Payer: Self-pay | Admitting: Oncology

## 2019-11-25 MED FILL — VENCLEXTA 100 MG TABS: 100 | 30 days supply | Qty: 60 | Fill #2

## 2019-12-03 ENCOUNTER — Other Ambulatory Visit: Payer: Self-pay | Admitting: Oncology

## 2019-12-10 ENCOUNTER — Other Ambulatory Visit: Payer: Self-pay | Admitting: *Deleted

## 2019-12-10 ENCOUNTER — Telehealth: Payer: Self-pay | Admitting: *Deleted

## 2019-12-10 DIAGNOSIS — C9110 Chronic lymphocytic leukemia of B-cell type not having achieved remission: Secondary | ICD-10-CM

## 2019-12-10 NOTE — Telephone Encounter (Signed)
Pt left message with on-call RN yest eve asking about getting Covid Antibody testing.  She had her 2nd vaccine 2 wks ago & is planning a trip 12/21/19.  Called pt & she has called her PCP & wasn't able to get anywhere with scheduling a test.  She states they didn't seem to understand what she needed.  Informed that she could probably go to [facility], [facility], or [facility] & encouraged her to google/call to see.  Talked with Dr Jana Hakim & he reports that pt probably would not develop antibodies due to her diagnosis & whether she did or not would still have to follow precautions.  She understood that.  Talked with our lab & they do have an antibody test that can be ordered & pt states that she would prefer to come here.  Order & schedule message will be entered.

## 2019-12-14 ENCOUNTER — Other Ambulatory Visit: Payer: Self-pay | Admitting: *Deleted

## 2019-12-14 ENCOUNTER — Other Ambulatory Visit: Payer: Self-pay

## 2019-12-14 ENCOUNTER — Inpatient Hospital Stay: Payer: Medicare PPO | Attending: Oncology

## 2019-12-14 DIAGNOSIS — Z0184 Encounter for antibody response examination: Secondary | ICD-10-CM | POA: Insufficient documentation

## 2019-12-14 DIAGNOSIS — D801 Nonfamilial hypogammaglobulinemia: Secondary | ICD-10-CM | POA: Diagnosis not present

## 2019-12-14 DIAGNOSIS — C9110 Chronic lymphocytic leukemia of B-cell type not having achieved remission: Secondary | ICD-10-CM | POA: Diagnosis not present

## 2019-12-14 LAB — CBC WITH DIFFERENTIAL/PLATELET
Abs Immature Granulocytes: 0.01 10*3/uL (ref 0.00–0.07)
Basophils Absolute: 0 10*3/uL (ref 0.0–0.1)
Basophils Relative: 1 %
Eosinophils Absolute: 0 10*3/uL (ref 0.0–0.5)
Eosinophils Relative: 1 %
HCT: 42.1 % (ref 36.0–46.0)
Hemoglobin: 13.4 g/dL (ref 12.0–15.0)
Immature Granulocytes: 0 %
Lymphocytes Relative: 28 %
Lymphs Abs: 0.9 10*3/uL (ref 0.7–4.0)
MCH: 28.7 pg (ref 26.0–34.0)
MCHC: 31.8 g/dL (ref 30.0–36.0)
MCV: 90.1 fL (ref 80.0–100.0)
Monocytes Absolute: 0.4 10*3/uL (ref 0.1–1.0)
Monocytes Relative: 13 %
Neutro Abs: 1.9 10*3/uL (ref 1.7–7.7)
Neutrophils Relative %: 57 %
Platelets: 122 10*3/uL — ABNORMAL LOW (ref 150–400)
RBC: 4.67 MIL/uL (ref 3.87–5.11)
RDW: 16.3 % — ABNORMAL HIGH (ref 11.5–15.5)
WBC: 3.3 10*3/uL — ABNORMAL LOW (ref 4.0–10.5)
nRBC: 0 % (ref 0.0–0.2)

## 2019-12-14 LAB — COMPREHENSIVE METABOLIC PANEL
ALT: 63 U/L — ABNORMAL HIGH (ref 0–44)
AST: 56 U/L — ABNORMAL HIGH (ref 15–41)
Albumin: 3.9 g/dL (ref 3.5–5.0)
Alkaline Phosphatase: 161 U/L — ABNORMAL HIGH (ref 38–126)
Anion gap: 10 (ref 5–15)
BUN: 15 mg/dL (ref 8–23)
CO2: 26 mmol/L (ref 22–32)
Calcium: 9.6 mg/dL (ref 8.9–10.3)
Chloride: 107 mmol/L (ref 98–111)
Creatinine, Ser: 0.93 mg/dL (ref 0.44–1.00)
GFR calc Af Amer: >60 mL/min (ref >60)
GFR calc non Af Amer: >60 mL/min (ref >60)
Glucose, Bld: 101 mg/dL — ABNORMAL HIGH (ref 70–99)
Potassium: 4.2 mmol/L (ref 3.5–5.1)
Sodium: 143 mmol/L (ref 135–145)
Total Bilirubin: 0.7 mg/dL (ref 0.3–1.2)
Total Protein: 6.3 g/dL — ABNORMAL LOW (ref 6.5–8.1)

## 2019-12-14 LAB — LACTATE DEHYDROGENASE: LDH: 153 U/L (ref 98–192)

## 2019-12-14 LAB — SAR COV2 SEROLOGY (COVID19)AB(IGG),IA: SARS-CoV-2 Ab, IgG: NONREACTIVE

## 2019-12-15 LAB — IGG, IGA, IGM
IgA: 26 mg/dL — ABNORMAL LOW (ref 87–352)
IgG (Immunoglobin G), Serum: 280 mg/dL — ABNORMAL LOW (ref 586–1602)
IgM (Immunoglobulin M), Srm: <5 mg/dL — ABNORMAL LOW (ref 26–217)

## 2019-12-22 ENCOUNTER — Other Ambulatory Visit: Payer: Self-pay | Admitting: Adult Health

## 2019-12-22 DIAGNOSIS — C9110 Chronic lymphocytic leukemia of B-cell type not having achieved remission: Secondary | ICD-10-CM

## 2019-12-25 ENCOUNTER — Other Ambulatory Visit: Payer: Self-pay | Admitting: Oncology

## 2019-12-25 DIAGNOSIS — C9110 Chronic lymphocytic leukemia of B-cell type not having achieved remission: Secondary | ICD-10-CM

## 2019-12-28 ENCOUNTER — Other Ambulatory Visit: Payer: Self-pay

## 2019-12-28 DIAGNOSIS — C9110 Chronic lymphocytic leukemia of B-cell type not having achieved remission: Secondary | ICD-10-CM

## 2019-12-28 MED ORDER — VENETOCLAX 100 MG PO TABS: 200.0000 mg | ORAL_TABLET | Freq: Every day | ORAL | 2 refills | Status: DC

## 2019-12-29 MED FILL — VENCLEXTA 100 MG TABS: 100 | 30 days supply | Qty: 60 | Fill #0

## 2020-01-01 ENCOUNTER — Other Ambulatory Visit: Payer: Self-pay | Admitting: Oncology

## 2020-01-04 NOTE — Progress Notes (Signed)
[facility]  [phone] [phone]    ID: Maria [name]   DOB: 10-05-1950  MR#: [id number]  [id number]  Patient Care Team: Marton Redwood, MD as PCP - General (Internal Medicine) [name], MD as Consulting Physician (Orthopedic Surgery) Andersson Larrabee, [name] Dad, MD as Consulting Physician (Oncology) [name], MD as Consulting Physician (General Surgery) Causey, [name], NP as Nurse Practitioner (Hematology and Oncology) OTHER MD:  CHIEF COMPLAINT: Small lymphocytic lymphoma/chronic lymphocytic leukemia  CURRENT TREATMENT: venetoclax, rivaroxaban   INTERVAL HISTORY: Maria [name] returns today for follow-up and treatment of her CLL.  Since the last visit here she visited family in [place], got scratched by their friendly dogs with some bleeding, and ate quite a bit of their excellent cooking  She continues on Venetoclax, currently at 200 mg/day.  She wonders if this drug or the Valtrex that she is taking is causing her to feel stiff.  When she exercises she feels less stiff but whenever she stops she starts getting a little bit achy  She receives IVIG whenever her total IgG drops near or below 400.  Her counts had dropped below 300 at the last determination and she will receive IVIG today.  Tisha was switched to rivaroxaban for her left lower extremity DVT on 09/10/2019.  She is tolerating this well.  Of course if she does get scratched or cut she will bleed more than another person.  She is careful regarding not falling.  Since her last visit, she underwent Covid-19 antibody testing on 12/14/2019, which was negative.    REVIEW OF SYSTEMS: [name] is not exercising regularly.  She is thinking of going to senior center.  She understands that despite the fact that she received the COVID-19 vaccine she will not make antibodies and she should consider herself not vaccinated.  If she goes to the senior center she should make  sure that everybody else there has had the vaccine.  We are also continuing the Valtrex of course.  She wishes to stop the allopurinol and we can do that.  She has gained a little bit of weight.  She has had no drenching sweats, no adenopathy, no rash, no pruritus.  Overall she feels rather well she says.  A detailed review of systems was otherwise noncontributory    HISTORY OF PRESENT ILLNESS: From the original intake note:  Maria [name] developed what she thought was "the flu" in December of 2002. She noted a large lymph node developing in her right anterior cervical area. 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   Her subsequent history is as detailed above.   PAST MEDICAL HISTORY: Past Medical History:  Diagnosis Date  . Allergy   . Arthritis   . Asthma   . Cancer [facility])    chronic lymphacytic leukemia  . CLL (chronic lymphocytic leukemia) ([place])   . Diabetes mellitus    PMH  . Headache    migraine  . Hyperlipidemia   . Hypertension   . Hypothyroidism   . Lymphoma ([place])   . Thyroid disease    hypothyroidism  . Wears glasses     PAST SURGICAL HISTORY: Past Surgical History:  Procedure Laterality Date  . ABDOMINAL HYSTERECTOMY    . AXILLARY LYMPH NODE BIOPSY Right 08/21/2018   Procedure: RIGHT AXILLARY LYMPH NODE BIOPSY ERAS PATHWAY;  Surgeon: [name], MD;  Location: [facility];  Service: General;  Laterality: Right;  LMA  .  BREAST EXCISIONAL BIOPSY Right   . COLONOSCOPY    . IR IMAGING GUIDED PORT INSERTION  11/10/2018  . LYMPH NODE DISSECTION    . STRABISMUS SURGERY    . TONSILLECTOMY    . TOTAL KNEE ARTHROPLASTY Right 12/03/2016   Procedure: RIGHT TOTAL KNEE ARTHROPLASTY;  Surgeon: [name], MD;  Location: [facility];  Service: Orthopedics;  Laterality: Right;    FAMILY HISTORY: Family History  Problem Relation Age of Onset  . Liver cancer Mother   . Heart disease Father   . Breast  cancer Sister 23  . Colon cancer Neg Hx   . Pancreatic cancer Neg Hx   . Rectal cancer Neg Hx   . Stomach cancer Neg Hx    The patient's father died at the age of 65 from a myocardial infarction. The patient's mother died at the age of 60 from primary liver carcinoma. The patient had no brothers. Her one sister, Maria [name], has a history of breast cancer   GYNECOLOGIC HISTORY: Menarche at around age 30. The patient is GX P0. 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 She lives by herself. She had two cats, [name] and [place], but they are currently with her sister and [place]eson thinks they would be more at home there. She attends a Investment banker, operational.               ADVANCED DIRECTIVES: Completed 02/03/2019. She has named her niece as her 2.   HEALTH MAINTENANCE: Social History   Socioeconomic History  . Marital status: Single    Spouse name: Not on file  . Number of children: Not on file  . Years of education: Not on file  . Highest education level: Not on file  Occupational History  . Not on file  Tobacco Use  . Smoking status: Never Smoker  . Smokeless tobacco: Never Used  Substance and Sexual Activity  . Alcohol use: Yes    Comment: occasional alcohol intake; once or twice monthly  . Drug use: No  . Sexual activity: Not on file  Other Topics Concern  . Not on file  Social History Narrative  . Not on file   Social Determinants of Health   Financial Resource Strain:   . Difficulty of Paying Living Expenses:   Food Insecurity:   . Worried About Charity fundraiser in the Last Year:   . Arboriculturist in the Last Year:   Transportation Needs:   . Film/video editor (Medical):   Marland Kitchen Lack of Transportation (Non-Medical):   Physical Activity:   . Days of Exercise per Week:   . Minutes of Exercise  per Session:   Stress:   . Feeling of Stress :   Social Connections:   . Frequency of Communication with Friends and Family:   . Frequency of Social Gatherings with Friends and Family:   . Attends Religious Services:   . Active Member of Clubs or Organizations:   . Attends Archivist Meetings:   Marland Kitchen Marital Status:   Intimate Partner Violence:   . Fear of Current or Ex-Partner:   . Emotionally Abused:   Marland Kitchen Physically Abused:   . Sexually Abused:                 Colonoscopy: 06/01/2014/[facility]  PAP:             Bone density:             Lipid panel:  228/114/50/155/ratio 4.6  On 10/01/2016  ALLERGIES: Allergies  Allergen Reactions  . Iohexol Swelling and Rash  . Compazine [Prochlorperazine Edisylate] Other (See Comments)    Mild confusion  . Contrast Media  [Iodinated Diagnostic Agents]   . Lactose Intolerance (Gi)     Diarrhea, gas bloating  . Cephalexin Rash    CURRENT MEDICATIONS: Current Outpatient Medications  Medication Sig Dispense Refill  . acetaminophen (TYLENOL 8 HOUR ARTHRITIS PAIN) 650 MG CR tablet Take 650 mg by mouth every 8 (eight) hours as needed for pain.    Marland Kitchen allopurinol (ZYLOPRIM) 300 MG tablet TAKE 1 TABLET BY MOUTH EVERY DAY 90 tablet 1  . furosemide (LASIX) 20 MG tablet TAKE 1 TABLET BY MOUTH EVERY DAY 90 tablet 1  . KLOR-CON M20 20 MEQ tablet TAKE 1 TABLET BY MOUTH EVERY DAY 90 tablet 1  . ondansetron (ZOFRAN) 8 MG tablet Take 0.5-1 tablets (4-8 mg total) by mouth every 8 (eight) hours as needed for nausea or vomiting. 30 tablet 0  . rosuvastatin (CRESTOR) 10 MG tablet Take 10 mg by mouth at bedtime.  11  . SYNTHROID 88 MCG tablet Take 88 mcg by mouth daily before breakfast.     . valACYclovir (VALTREX) 1000 MG tablet Take 1 tablet (1,000 mg total) by mouth daily. 90 tablet 6  . venetoclax 100 MG TABS Take 200 mg by mouth daily. Take with food and water at approximately the same time each day. 60 tablet 2  . XARELTO 20 MG TABS  tablet TAKE 1 TABLET (20 MG TOTAL) BY MOUTH DAILY WITH SUPPER. 90 tablet 1  . Zoledronic Acid (RECLAST IV) Inject into the vein. Once a year     No current facility-administered medications for this visit.    OBJECTIVE: white woman in no acute distress  Vitals:   01/05/20 0850  BP: (!) 121/52  [name]: 67  Resp: 18  Temp: 98.2 F (36.8 C)  SpO2: 100%   Wt Readings from Last 3 Encounters:  01/05/20 188 lb 14.4 oz (85.7 kg)  11/05/19 184 lb 14.4 oz (83.9 kg)  09/09/19 184 lb (83.5 kg)   Body mass index is 30.49 kg/m.    ECOG FS:1 - Symptomatic but completely ambulatory  Sclerae unicteric, EOMs intact Wearing a mask No cervical or supraclavicular adenopathy, no axillary or inguinal adenopathy Lungs no rales or rhonchi Heart regular rate and rhythm Abd soft, nontender, positive bowel sounds MSK no focal spinal tenderness, no upper extremity lymphedema Neuro: nonfocal, well oriented, appropriate affect Breasts: Deferred   LAB RESULTS: Lab Results  Component Value Date   WBC 3.3 (L) 12/14/2019   NEUTROABS 1.9 12/14/2019   HGB 13.4 12/14/2019   HCT 42.1 12/14/2019   MCV 90.1 12/14/2019   PLT 122 (L) 12/14/2019   CMP Latest Ref Rng & Units 12/14/2019 11/05/2019 09/09/2019  Glucose 70 - 99 mg/dL 101(H) 99 102(H)  BUN 8 - 23 mg/dL 15 17 19   Creatinine 0.44 - 1.00 mg/dL 0.93 0.79 0.86  Sodium 135 - 145 mmol/L 143 142 139  Potassium 3.5 - 5.1 mmol/L 4.2 3.8 4.4  Chloride 98 - 111 mmol/L 107 108 105  CO2 22 - 32 mmol/L 26 26 23   Calcium 8.9 - 10.3 mg/dL 9.6 9.6 9.8  Total Protein 6.5 - 8.1 g/dL 6.3(L) 6.2(L) 6.5  Total  Bilirubin 0.3 - 1.2 mg/dL 0.7 0.5 0.5  Alkaline Phos 38 - 126 U/L 161(H) 124 171(H)  AST 15 - 41 U/L 56(H) 22 28  ALT 0 - 44 U/L 63(H) 19 25    No results for input(s): LABCA2 in the last 72 hours.   STUDIES: No results found.   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             (b) rituximab added to ibrutinib, first dose 08/27/2018             (c) hepatitis B studies 08/27/2018 negative             (f) ibrutinib/rituximab discontinued after 10/02/2018 dose w/o obvious response  (6) bendamustine/rituximab started 10/22/2018, discontinued after 1 cycle with rapid progression  (7) obinutuzumab/Gaziva started 11/11/2018  (a) second dose given 11/19/2018 together with chemotherapy  (b) third dose given 12/26/2018 and continuing every 3 weeks  (c) obinutuzumab discontinued after 09/09/2019 dose, with documentation of complete remission  (8) CH[O]P chemotherapy started 11/19/2018, completed 8 doses 05/19/2019             (a) echocardiogram on 11/07/2018 shows EF of 55-60%  (b) second cycle of CH[O]P postponed because of pandemic, given 12/26/2018, greatly reduced doses  (c) cycle 3 of CH[O]P/obinutuzumab given 02/03/2019 at increased doses  (d) cycle 4 and 5 of CH[O]P given on 6/16 and 7/7 at increased doses of 35mg /m2 and 550mg /m2  (e) Repeat echo on 04/22/2019 shows an EF of 60-65%  (9) severe immunocompromise: Marked  hypogammaglobulinemia  (a) starting March 2020 she receives IVIG every 8 weeks as needed  (10) 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  (a) LMWH started 04/23/2019  (b) 75% dose reduction September 2020 secondary to bruising  (c) switched to rivaroxaban 09/10/2019  (d) 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  Her disease is currently very well controlled on venetoclax and she is tolerating it at the current dose without any difficulties.  She also had a left lower extremity DVT.  We are going to be continuing the rivaroxaban.  She is paying $80 for 3 months and that is doable.  She is taking appropriate bleeding precautions.  We discussed exercise and how  she can safely do it.  She is going to tape some PBS programs or get up a little earlier in the morning.  She can also walk outside where it is quite safe.  She will see me again in 2 months with labs and in 4 months with labs and an IVIG infusion.  She knows to call for any other issue that may develop before the next visit.  Total encounter time 35 minutes.Sarajane Jews C. Tersea Aulds, MD 01/05/20 9:10 AM Medical Oncology and Hematology [facility] [place], [place] [address] Tel. [phone]    Fax. [phone]   I, Wilburn Mylar, am acting as scribe for Dr. [name] Dad. Shalva Rozycki.  I, Lurline Del MD, have reviewed the above documentation for accuracy and completeness, and I agree with the above.   *Total Encounter Time as defined by the Centers for Medicare and Medicaid Services includes, in addition to the face-to-face time of a patient visit (documented in the note above) non-face-to-face  time: obtaining and reviewing outside history, ordering and reviewing medications, tests or procedures, care coordination (communications with other health care professionals or caregivers) and documentation in the medical record.

## 2020-01-05 ENCOUNTER — Inpatient Hospital Stay: Payer: Medicare PPO

## 2020-01-05 ENCOUNTER — Other Ambulatory Visit: Payer: Medicare PPO

## 2020-01-05 ENCOUNTER — Other Ambulatory Visit: Payer: Self-pay

## 2020-01-05 ENCOUNTER — Ambulatory Visit: Payer: Medicare PPO | Admitting: Oncology

## 2020-01-05 ENCOUNTER — Inpatient Hospital Stay: Payer: Medicare PPO | Admitting: Oncology

## 2020-01-05 VITALS — BP 121/52 | HR 67 | Temp 98.2°F | Resp 18 | Ht 66.0 in | Wt 188.9 lb

## 2020-01-05 VITALS — BP 137/55 | HR 67 | Temp 98.1°F | Resp 18

## 2020-01-05 DIAGNOSIS — G620 Drug-induced polyneuropathy: Secondary | ICD-10-CM

## 2020-01-05 DIAGNOSIS — T451X5A Adverse effect of antineoplastic and immunosuppressive drugs, initial encounter: Secondary | ICD-10-CM

## 2020-01-05 DIAGNOSIS — Z95828 Presence of other vascular implants and grafts: Secondary | ICD-10-CM

## 2020-01-05 DIAGNOSIS — C9110 Chronic lymphocytic leukemia of B-cell type not having achieved remission: Secondary | ICD-10-CM | POA: Diagnosis not present

## 2020-01-05 DIAGNOSIS — D696 Thrombocytopenia, unspecified: Secondary | ICD-10-CM

## 2020-01-05 DIAGNOSIS — E7800 Pure hypercholesterolemia, unspecified: Secondary | ICD-10-CM

## 2020-01-05 DIAGNOSIS — D801 Nonfamilial hypogammaglobulinemia: Secondary | ICD-10-CM

## 2020-01-05 LAB — CBC WITH DIFFERENTIAL/PLATELET
Abs Immature Granulocytes: 0 10*3/uL (ref 0.00–0.07)
Basophils Absolute: 0 10*3/uL (ref 0.0–0.1)
Basophils Relative: 1 %
Eosinophils Absolute: 0 10*3/uL (ref 0.0–0.5)
Eosinophils Relative: 1 %
HCT: 40.5 % (ref 36.0–46.0)
Hemoglobin: 12.9 g/dL (ref 12.0–15.0)
Immature Granulocytes: 0 %
Lymphocytes Relative: 27 %
Lymphs Abs: 1.1 10*3/uL (ref 0.7–4.0)
MCH: 29.2 pg (ref 26.0–34.0)
MCHC: 31.9 g/dL (ref 30.0–36.0)
MCV: 91.6 fL (ref 80.0–100.0)
Monocytes Absolute: 0.5 10*3/uL (ref 0.1–1.0)
Monocytes Relative: 13 %
Neutro Abs: 2.4 10*3/uL (ref 1.7–7.7)
Neutrophils Relative %: 58 %
Platelets: 138 10*3/uL — ABNORMAL LOW (ref 150–400)
RBC: 4.42 MIL/uL (ref 3.87–5.11)
RDW: 15.2 % (ref 11.5–15.5)
WBC: 4 10*3/uL (ref 4.0–10.5)
nRBC: 0 % (ref 0.0–0.2)

## 2020-01-05 LAB — LACTATE DEHYDROGENASE: LDH: 147 U/L (ref 98–192)

## 2020-01-05 LAB — COMPREHENSIVE METABOLIC PANEL WITH GFR
ALT: 16 U/L (ref 0–44)
AST: 18 U/L (ref 15–41)
Albumin: 4.1 g/dL (ref 3.5–5.0)
Alkaline Phosphatase: 115 U/L (ref 38–126)
Anion gap: 7 (ref 5–15)
BUN: 16 mg/dL (ref 8–23)
CO2: 25 mmol/L (ref 22–32)
Calcium: 9.5 mg/dL (ref 8.9–10.3)
Chloride: 107 mmol/L (ref 98–111)
Creatinine, Ser: 0.85 mg/dL (ref 0.44–1.00)
GFR calc Af Amer: >60 mL/min
GFR calc non Af Amer: >60 mL/min
Glucose, Bld: 103 mg/dL — ABNORMAL HIGH (ref 70–99)
Potassium: 4 mmol/L (ref 3.5–5.1)
Sodium: 139 mmol/L (ref 135–145)
Total Bilirubin: 0.5 mg/dL (ref 0.3–1.2)
Total Protein: 6.2 g/dL — ABNORMAL LOW (ref 6.5–8.1)

## 2020-01-05 MED ORDER — ACETAMINOPHEN 325 MG PO TABS
ORAL_TABLET | ORAL | Status: AC
  Filled 2020-01-05: qty 2

## 2020-01-05 MED ORDER — SODIUM CHLORIDE 0.9% FLUSH
10.0000 mL | Freq: Once | INTRAVENOUS | Status: AC
  Administered 2020-01-05: 14:00:00 10 mL
  Filled 2020-01-05: qty 10

## 2020-01-05 MED ORDER — ACETAMINOPHEN 325 MG PO TABS
650.0000 mg | ORAL_TABLET | Freq: Once | ORAL | Status: AC
  Administered 2020-01-05: 650 mg via ORAL

## 2020-01-05 MED ORDER — SODIUM CHLORIDE 0.9 % IV SOLN
INTRAVENOUS | Status: DC
  Filled 2020-01-05: qty 250

## 2020-01-05 MED ORDER — IMMUNE GLOBULIN (HUMAN) 10 GM/100ML IV SOLN
80.0000 g | Freq: Once | INTRAVENOUS | Status: AC
  Administered 2020-01-05: 80 g via INTRAVENOUS
  Filled 2020-01-05: qty 800

## 2020-01-05 MED ORDER — DIPHENHYDRAMINE HCL 25 MG PO CAPS
25.0000 mg | ORAL_CAPSULE | Freq: Once | ORAL | Status: AC
  Administered 2020-01-05: 25 mg via ORAL

## 2020-01-05 MED ORDER — DEXTROSE 5 % IV SOLN
INTRAVENOUS | Status: DC
  Filled 2020-01-05: qty 250

## 2020-01-05 MED ORDER — HEPARIN SOD (PORK) LOCK FLUSH 100 UNIT/ML IV SOLN
500.0000 [IU] | Freq: Once | INTRAVENOUS | Status: AC
  Administered 2020-01-05: 14:00:00 500 [IU]
  Filled 2020-01-05: qty 5

## 2020-01-05 MED ORDER — DIPHENHYDRAMINE HCL 25 MG PO CAPS
ORAL_CAPSULE | ORAL | Status: AC
  Filled 2020-01-05: qty 1

## 2020-01-05 NOTE — Patient Instructions (Signed)
Immune Globulin Injection What is this medicine? IMMUNE GLOBULIN (im MUNE GLOB yoo lin) helps to prevent or reduce the severity of certain infections in patients who are at risk. This medicine is collected from the pooled blood of many donors. It is used to treat immune system problems, thrombocytopenia, and Kawasaki syndrome. This medicine may be used for other purposes; ask your health care provider or pharmacist if you have questions. COMMON BRAND NAME(S): ASCENIV, Baygam, BIVIGAM, Carimune, Carimune NF, cutaquig, Cuvitru, Flebogamma, Flebogamma DIF, GamaSTAN, GamaSTAN S/D, Gamimune N, Gammagard, Gammagard S/D, Gammaked, Gammaplex, Gammar-P IV, Gamunex, Gamunex-C, Hizentra, Iveegam, Iveegam EN, Octagam, Panglobulin, Panglobulin NF, panzyga, Polygam S/D, Privigen, Sandoglobulin, Venoglobulin-S, Vigam, Vivaglobulin, Xembify What should I tell my health care provider before I take this medicine? They need to know if you have any of these conditions:  diabetes  extremely low or no immune antibodies in the blood  heart disease  history of blood clots  hyperprolinemia  infection in the blood, sepsis  kidney disease  recently received or scheduled to receive a vaccination  an unusual or allergic reaction to human immune globulin, albumin, maltose, sucrose, other medicines, foods, dyes, or preservatives  pregnant or trying to get pregnant  breast-feeding How should I use this medicine? This medicine is for injection into a muscle or infusion into a vein or skin. It is usually given by a health care professional in a hospital or clinic setting. In rare cases, some brands of this medicine might be given at home. You will be taught how to give this medicine. Use exactly as directed. Take your medicine at regular intervals. Do not take your medicine more often than directed. Talk to your pediatrician regarding the use of this medicine in children. While this drug may be prescribed for selected  conditions, precautions do apply. Overdosage: If you think you have taken too much of this medicine contact a poison control center or emergency room at once. NOTE: This medicine is only for you. Do not share this medicine with others. What if I miss a dose? It is important not to miss your dose. Call your doctor or health care professional if you are unable to keep an appointment. If you give yourself the medicine and you miss a dose, take it as soon as you can. If it is almost time for your next dose, take only that dose. Do not take double or extra doses. What may interact with this medicine?  aspirin and aspirin-like medicines  cisplatin  cyclosporine  medicines for infection like acyclovir, adefovir, amphotericin B, bacitracin, cidofovir, foscarnet, ganciclovir, gentamicin, pentamidine, vancomycin  NSAIDS, medicines for pain and inflammation, like ibuprofen or naproxen  pamidronate  vaccines  zoledronic acid This list may not describe all possible interactions. Give your health care provider a list of all the medicines, herbs, non-prescription drugs, or dietary supplements you use. Also tell them if you smoke, drink alcohol, or use illegal drugs. Some items may interact with your medicine. What should I watch for while using this medicine? Your condition will be monitored carefully while you are receiving this medicine. This medicine is made from pooled blood donations of many different people. It may be possible to pass an infection in this medicine. However, the donors are screened for infections and all products are tested for HIV and hepatitis. The medicine is treated to kill most or all bacteria and viruses. Talk to your doctor about the risks and benefits of this medicine. Do not have vaccinations for at least   14 days before, or until at least 3 months after receiving this medicine. What side effects may I notice from receiving this medicine? Side effects that you should report  to your doctor or health care professional as soon as possible:  allergic reactions like skin rash, itching or hives, swelling of the face, lips, or tongue  blue colored lips or skin  breathing problems  chest pain or tightness  fever  signs and symptoms of aseptic meningitis such as stiff neck; sensitivity to light; headache; drowsiness; fever; nausea; vomiting; rash  signs and symptoms of a blood clot such as chest pain; shortness of breath; pain, swelling, or warmth in the leg  signs and symptoms of hemolytic anemia such as fast heartbeat; tiredness; dark yellow or brown urine; or yellowing of the eyes or skin  signs and symptoms of kidney injury like trouble passing urine or change in the amount of urine  sudden weight gain  swelling of the ankles, feet, hands Side effects that usually do not require medical attention (report to your doctor or health care professional if they continue or are bothersome):  diarrhea  flushing  headache  increased sweating  joint pain  muscle cramps  muscle pain  nausea  pain, redness, or irritation at site where injected  tiredness This list may not describe all possible side effects. Call your doctor for medical advice about side effects. You may report side effects to FDA at 1-800-FDA-1088. Where should I keep my medicine? Keep out of the reach of children. This drug is usually given in a hospital or clinic and will not be stored at home. In rare cases, some brands of this medicine may be given at home. If you are using this medicine at home, you will be instructed on how to store this medicine. Throw away any unused medicine after the expiration date on the label. NOTE: This sheet is a summary. It may not cover all possible information. If you have questions about this medicine, talk to your doctor, pharmacist, or health care provider.  2020 Elsevier/Gold Standard (2019-04-01 12:51:14)  

## 2020-01-06 LAB — IGG, IGA, IGM
IgA: 26 mg/dL — ABNORMAL LOW (ref 87–352)
IgG (Immunoglobin G), Serum: 235 mg/dL — ABNORMAL LOW (ref 586–1602)
IgM (Immunoglobulin M), Srm: <5 mg/dL — ABNORMAL LOW (ref 26–217)

## 2020-01-28 MED FILL — VENCLEXTA 100 MG TABS: 100 | 30 days supply | Qty: 60 | Fill #1

## 2020-02-29 MED FILL — VENCLEXTA 100 MG TABS: 100 | 30 days supply | Qty: 60 | Fill #2

## 2020-03-18 ENCOUNTER — Other Ambulatory Visit: Payer: Self-pay | Admitting: Oncology

## 2020-03-20 NOTE — Progress Notes (Signed)
[place]  [phone] [phone]    ID: Maria [name]   DOB: [birth year]  MR#: [id number]  KVQ#:[id number]  Patient Care Team: Marton Redwood, MD as PCP - General (Internal Medicine) [name], MD as Consulting Physician (Orthopedic Surgery) Jeric Slagel, [name] Dad, MD as Consulting Physician (Oncology) [name], MD as Consulting Physician (General Surgery) [name], [name], NP as Nurse Practitioner (Hematology and Oncology) OTHER MD:  CHIEF COMPLAINT: Small lymphocytic lymphoma/chronic lymphocytic leukemia  CURRENT TREATMENT: venetoclax, rivaroxaban, IVIG   INTERVAL HISTORY: Leandrea returns today for follow-up and treatment of her small lymphocytic lymphoma/chronic lymphocytic leukemia. She was last seen here on 01/05/2020.   She continues on venetoclax 200mg  daily.  She is not aware of any side effects from this medication at present.    She also continues on rivaroxaban.  Currently she is obtaining this at no cost.  She does have some bruising but no overt bleeding.  Since her last visit here, has not undergone any additional studies.   Lab Results  Component Value Date   IGGSERUM 235 (L) 01/05/2020   IGGSERUM 280 (L) 12/14/2019   IGGSERUM 334 (L) 11/05/2019   IGA 26 (L) 01/05/2020   IGA 26 (L) 12/14/2019   IGA 22 (L) 11/05/2019   IGMSERUM <5 (L) 01/05/2020   IGMSERUM <5 (L) 12/14/2019   IGMSERUM 7 (L) 11/05/2019      REVIEW OF SYSTEMS: Maria [name] has had no intercurrent fevers, is not aware of any adenopathy, and has actually gained a little weight which she would prefer to not.  [facility].  I was glad to sign a release form for her but she understands that even though she has had her vaccines she has no antibodies because she essentially has no B cells.  She needs to think of herself as not having had the vaccine and is being immunocompromised therefore needs  to wear her mask, distance, and wash hands as before.  She had her right cataract done in the result was good but she is having some difficulty with the ophthalmologist she used and she would like a second opinion which I was glad to give her.  Aside from these issues a detailed review of systems today was stable  HISTORY OF PRESENT ILLNESS: From the original intake note:  [name] developed what she thought was "the flu" in December of 2002. She noted a large lymph node developing in her right anterior cervical area. 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   Her subsequent history is as detailed above.   PAST MEDICAL HISTORY: Past Medical History:  Diagnosis Date  . Allergy   . Arthritis   . Asthma   . Cancer [facility])    chronic lymphacytic leukemia  . CLL (chronic lymphocytic leukemia) ([place])   . Diabetes mellitus    PMH  . Headache    migraine  . Hyperlipidemia   . Hypertension   . Hypothyroidism   . Lymphoma ([place])   . Thyroid disease    hypothyroidism  . Wears glasses     PAST SURGICAL HISTORY: Past Surgical History:  Procedure Laterality Date  . ABDOMINAL HYSTERECTOMY    . AXILLARY LYMPH NODE BIOPSY Right 08/21/2018   Procedure: RIGHT AXILLARY LYMPH NODE BIOPSY ERAS PATHWAY;  Surgeon: [name], MD;  Location: [facility];  Service: General;  Laterality: Right;  LMA  .  BREAST EXCISIONAL BIOPSY Right   . COLONOSCOPY    . IR IMAGING GUIDED PORT INSERTION  11/10/2018  . LYMPH NODE DISSECTION    . STRABISMUS SURGERY    . TONSILLECTOMY    . TOTAL KNEE ARTHROPLASTY Right 12/03/2016   Procedure: RIGHT TOTAL KNEE ARTHROPLASTY;  Surgeon: [name], MD;  Location: [facility];  Service: Orthopedics;  Laterality: Right;    FAMILY HISTORY: Family History  Problem Relation Age of Onset  . Liver cancer Mother   . Heart disease Father   . Breast cancer Sister 36  . Colon cancer Neg Hx   .  Pancreatic cancer Neg Hx   . Rectal cancer Neg Hx   . Stomach cancer Neg Hx    The patient's father died at the age of 69 from a myocardial infarction. The patient's mother died at the age of 69 from primary liver carcinoma. The patient had no brothers. Her one sister, Maria [name], has a history of breast cancer   GYNECOLOGIC HISTORY: Menarche at around age 69. The patient is GX P0. 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 She lives by herself. She had two cats, [name] and [place], but they are currently with her sister and Conni thinks they would be more at home there. She attends a Investment banker, operational.               ADVANCED DIRECTIVES: Completed 02/03/2019. She has named her niece as her 32.   HEALTH MAINTENANCE: Social History   Socioeconomic History  . Marital status: Single    Spouse name: Not on file  . Number of children: Not on file  . Years of education: Not on file  . Highest education level: Not on file  Occupational History  . Not on file  Tobacco Use  . Smoking status: Never Smoker  . Smokeless tobacco: Never Used  Vaping Use  . Vaping Use: Never used  Substance and Sexual Activity  . Alcohol use: Yes    Comment: occasional alcohol intake; once or twice monthly  . Drug use: No  . Sexual activity: Not on file  Other Topics Concern  . Not on file  Social History Narrative  . Not on file   Social Determinants of Health   Financial Resource Strain:   . Difficulty of Paying Living Expenses:   Food Insecurity:   . Worried About Charity fundraiser in the Last Year:   . Arboriculturist in the Last Year:   Transportation Needs:   . Film/video editor (Medical):   Marland Kitchen Lack of Transportation (Non-Medical):   Physical Activity:   . Days of Exercise per Week:   . Minutes of Exercise per  Session:   Stress:   . Feeling of Stress :   Social Connections:   . Frequency of Communication with Friends and Family:   . Frequency of Social Gatherings with Friends and Family:   . Attends Religious Services:   . Active Member of Clubs or Organizations:   . Attends Archivist Meetings:   Marland Kitchen Marital Status:   Intimate Partner Violence:   . Fear of Current or Ex-Partner:   . Emotionally Abused:   Marland Kitchen Physically Abused:   . Sexually Abused:  Colonoscopy: 06/01/2014/[facility]             PAP:             Bone density:             Lipid panel:  228/114/50/155/ratio 4.6  On 10/01/2016  ALLERGIES: Allergies  Allergen Reactions  . Iohexol Swelling and Rash  . Compazine [Prochlorperazine Edisylate] Other (See Comments)    Mild confusion  . Contrast Media  [Iodinated Diagnostic Agents]   . Lactose Intolerance (Gi)     Diarrhea, gas bloating  . Cephalexin Rash    CURRENT MEDICATIONS: Current Outpatient Medications  Medication Sig Dispense Refill  . acetaminophen (TYLENOL 8 HOUR ARTHRITIS PAIN) 650 MG CR tablet Take 650 mg by mouth every 8 (eight) hours as needed for pain.    . furosemide (LASIX) 20 MG tablet TAKE 1 TABLET BY MOUTH EVERY DAY 90 tablet 1  . KLOR-CON M20 20 MEQ tablet TAKE 1 TABLET BY MOUTH EVERY DAY 90 tablet 1  . ondansetron (ZOFRAN) 8 MG tablet Take 0.5-1 tablets (4-8 mg total) by mouth every 8 (eight) hours as needed for nausea or vomiting. 30 tablet 0  . rosuvastatin (CRESTOR) 10 MG tablet Take 10 mg by mouth at bedtime.  11  . SYNTHROID 88 MCG tablet Take 88 mcg by mouth daily before breakfast.     . valACYclovir (VALTREX) 1000 MG tablet Take 1 tablet (1,000 mg total) by mouth daily. 90 tablet 6  . venetoclax 100 MG TABS Take 200 mg by mouth daily. Take with food and water at approximately the same time each day. 60 tablet 2  . XARELTO 20 MG TABS tablet TAKE 1 TABLET (20 MG TOTAL) BY MOUTH DAILY WITH SUPPER. 90 tablet 1  . Zoledronic Acid  (RECLAST IV) Inject into the vein. Once a year     No current facility-administered medications for this visit.    OBJECTIVE: white woman in no acute distress Vitals:   03/21/20 1457  BP: (!) 138/57  Pulse: (!) 58  Resp: 18  Temp: 98.2 F (36.8 C)  SpO2: 100%   Wt Readings from Last 3 Encounters:  03/21/20 189 lb 1.6 oz (85.8 kg)  01/05/20 188 lb 14.4 oz (85.7 kg)  11/05/19 184 lb 14.4 oz (83.9 kg)   Body mass index is 30.52 kg/m.    ECOG FS:1 - Symptomatic but completely ambulatory  Ocular: Sclerae unicteric, pupils round and equal Ear-nose-throat: Wearing a mask Lymphatic: No cervical or supraclavicular adenopathy, no axillary or inguinal adenopathy Lungs no rales or rhonchi Heart regular rate and rhythm Abd soft, nontender, positive bowel sounds MSK no focal spinal tenderness, no joint edema Neuro: non-focal, well-oriented, appropriate affect Breasts: Deferred    LAB RESULTS: Lab Results  Component Value Date   WBC 3.9 (L) 03/21/2020   NEUTROABS 2.1 03/21/2020   HGB 12.7 03/21/2020   HCT 39.7 03/21/2020   MCV 89.2 03/21/2020   PLT 140 (L) 03/21/2020   CMP Latest Ref Rng & Units 03/21/2020 01/05/2020 12/14/2019  Glucose 70 - 99 mg/dL 96 103(H) 101(H)  BUN 8 - 23 mg/dL 15 16 15   Creatinine 0.44 - 1.00 mg/dL 0.83 0.85 0.93  Sodium 135 - 145 mmol/L 142 139 143  Potassium 3.5 - 5.1 mmol/L 4.2 4.0 4.2  Chloride 98 - 111 mmol/L 108 107 107  CO2 22 - 32 mmol/L 25 25 26   Calcium 8.9 - 10.3 mg/dL 9.8 9.5 9.6  Total Protein 6.5 - 8.1 g/dL 6.5 6.2(L)  6.3(L)  Total Bilirubin 0.3 - 1.2 mg/dL 0.5 0.5 0.7  Alkaline Phos 38 - 126 U/L 118 115 161(H)  AST 15 - 41 U/L 24 18 56(H)  ALT 0 - 44 U/L 24 16 63(H)    No results for input(s): LABCA2 in the last 72 hours.  Lab Results  Component Value Date   IGGSERUM 235 (L) 01/05/2020   IGGSERUM 280 (L) 12/14/2019   IGGSERUM 334 (L) 11/05/2019   IGA 26 (L) 01/05/2020   IGA 26 (L) 12/14/2019   IGA 22 (L) 11/05/2019    IGMSERUM <5 (L) 01/05/2020   IGMSERUM <5 (L) 12/14/2019   IGMSERUM 7 (L) 11/05/2019     STUDIES: No results found.   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             (b) rituximab added to ibrutinib, first dose 08/27/2018             (c) hepatitis B studies 08/27/2018 negative             (f) ibrutinib/rituximab discontinued after 10/02/2018 dose w/o obvious response  (6) bendamustine/rituximab started 10/22/2018, discontinued after 1 cycle with rapid progression  (7) obinutuzumab/Gaziva started 11/11/2018  (a) second dose given 11/19/2018 together with chemotherapy  (b) third dose given 12/26/2018 and continuing every 3 weeks  (c) obinutuzumab discontinued after 09/09/2019 dose, with documentation of complete remission  (8) CH[O]P chemotherapy started 11/19/2018, completed 8 doses 05/19/2019             (a) echocardiogram on 11/07/2018 shows EF of 55-60%  (b) second cycle of CH[O]P postponed because of pandemic, given 12/26/2018, greatly reduced doses  (c) cycle 3 of  CH[O]P/obinutuzumab given 02/03/2019 at increased doses  (d) cycle 4 and 5 of CH[O]P given on 6/16 and 7/7 at increased doses of 35mg /m2 and 550mg /m2  (e) Repeat echo on 04/22/2019 shows an EF of 60-65%  (9) severe immunocompromise: Marked hypogammaglobulinemia  (a) starting March 2020 she received IVIG every 8 weeks as needed  (b) starting September 2021 she receives IVIG every 3 months  (10) 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  (a) LMWH started 04/23/2019  (b) 75% dose reduction September 2020 secondary to bruising  (c) switched to rivaroxaban 09/10/2019  (d) 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  Clinically she is in complete remission which is very favorable.  Of course she remains severely immunocompromised.  She is very careful about this and understands that even though she received the COVID-19 vaccine she has no antibodies.  I do command her exercise plan.  She simply needs to be extremely careful with distancing, handwashing and masking.  It will help if she receives IVIG on a more regular basis.  She is scheduled for this already in December.  We are going to be doing this every 12 weeks at least for the next 2 years and then reassess.  She will have a repeat Doppler ultrasonography of the left lower extremity in late November of this year.  If there has been complete clearance which I doubt she may consider going off the rivaroxaban but my recommendation to her at least at this point is to continue lifelong anticoagulation.  If the PET  scan shows a complete remission this year and next we can consider going off the venetoclax and starting observation  She knows to call for any other issue that may develop before the next visit.  Total encounter time 35 minutes.Sarajane Jews C. Latoyna Hird, MD 03/21/20 3:32 PM Medical Oncology and Hematology [facility] [place], [place] [address] Tel. [phone]    Fax. [phone]   I, Jacqualyn Posey am acting as a Education administrator for Chauncey Cruel, MD.   I, Lurline Del MD, have reviewed the above documentation for accuracy and completeness, and I agree with the above.     *Total Encounter Time as defined by the Centers for Medicare and Medicaid Services includes, in addition to the face-to-face time of a patient visit (documented in the note above) non-face-to-face time: obtaining and reviewing outside history, ordering and reviewing medications, tests or procedures, care coordination (communications with other health care professionals or caregivers) and documentation in the medical record.

## 2020-03-21 ENCOUNTER — Inpatient Hospital Stay: Payer: Medicare PPO

## 2020-03-21 ENCOUNTER — Inpatient Hospital Stay: Payer: Medicare PPO | Attending: Oncology | Admitting: Oncology

## 2020-03-21 ENCOUNTER — Other Ambulatory Visit: Payer: Self-pay

## 2020-03-21 VITALS — BP 138/57 | HR 58 | Temp 98.2°F | Resp 18 | Ht 66.0 in | Wt 189.1 lb

## 2020-03-21 DIAGNOSIS — D696 Thrombocytopenia, unspecified: Secondary | ICD-10-CM | POA: Diagnosis not present

## 2020-03-21 DIAGNOSIS — Z95828 Presence of other vascular implants and grafts: Secondary | ICD-10-CM

## 2020-03-21 DIAGNOSIS — C9110 Chronic lymphocytic leukemia of B-cell type not having achieved remission: Secondary | ICD-10-CM

## 2020-03-21 DIAGNOSIS — D801 Nonfamilial hypogammaglobulinemia: Secondary | ICD-10-CM | POA: Insufficient documentation

## 2020-03-21 DIAGNOSIS — T451X5A Adverse effect of antineoplastic and immunosuppressive drugs, initial encounter: Secondary | ICD-10-CM

## 2020-03-21 DIAGNOSIS — Z7901 Long term (current) use of anticoagulants: Secondary | ICD-10-CM | POA: Insufficient documentation

## 2020-03-21 DIAGNOSIS — G620 Drug-induced polyneuropathy: Secondary | ICD-10-CM | POA: Diagnosis not present

## 2020-03-21 DIAGNOSIS — I82412 Acute embolism and thrombosis of left femoral vein: Secondary | ICD-10-CM | POA: Diagnosis not present

## 2020-03-21 LAB — CBC WITH DIFFERENTIAL/PLATELET
Abs Immature Granulocytes: 0.01 10*3/uL (ref 0.00–0.07)
Basophils Absolute: 0 10*3/uL (ref 0.0–0.1)
Basophils Relative: 1 %
Eosinophils Absolute: 0 10*3/uL (ref 0.0–0.5)
Eosinophils Relative: 1 %
HCT: 39.7 % (ref 36.0–46.0)
Hemoglobin: 12.7 g/dL (ref 12.0–15.0)
Immature Granulocytes: 0 %
Lymphocytes Relative: 32 %
Lymphs Abs: 1.2 10*3/uL (ref 0.7–4.0)
MCH: 28.5 pg (ref 26.0–34.0)
MCHC: 32 g/dL (ref 30.0–36.0)
MCV: 89.2 fL (ref 80.0–100.0)
Monocytes Absolute: 0.5 10*3/uL (ref 0.1–1.0)
Monocytes Relative: 13 %
Neutro Abs: 2.1 10*3/uL (ref 1.7–7.7)
Neutrophils Relative %: 53 %
Platelets: 140 10*3/uL — ABNORMAL LOW (ref 150–400)
RBC: 4.45 MIL/uL (ref 3.87–5.11)
RDW: 13.6 % (ref 11.5–15.5)
WBC: 3.9 10*3/uL — ABNORMAL LOW (ref 4.0–10.5)
nRBC: 0 % (ref 0.0–0.2)

## 2020-03-21 LAB — COMPREHENSIVE METABOLIC PANEL
ALT: 24 U/L (ref 0–44)
AST: 24 U/L (ref 15–41)
Albumin: 4 g/dL (ref 3.5–5.0)
Alkaline Phosphatase: 118 U/L (ref 38–126)
Anion gap: 9 (ref 5–15)
BUN: 15 mg/dL (ref 8–23)
CO2: 25 mmol/L (ref 22–32)
Calcium: 9.8 mg/dL (ref 8.9–10.3)
Chloride: 108 mmol/L (ref 98–111)
Creatinine, Ser: 0.83 mg/dL (ref 0.44–1.00)
GFR calc Af Amer: >60 mL/min (ref >60)
GFR calc non Af Amer: >60 mL/min (ref >60)
Glucose, Bld: 96 mg/dL (ref 70–99)
Potassium: 4.2 mmol/L (ref 3.5–5.1)
Sodium: 142 mmol/L (ref 135–145)
Total Bilirubin: 0.5 mg/dL (ref 0.3–1.2)
Total Protein: 6.5 g/dL (ref 6.5–8.1)

## 2020-03-21 LAB — LACTATE DEHYDROGENASE: LDH: 139 U/L (ref 98–192)

## 2020-03-21 MED ORDER — SODIUM CHLORIDE 0.9% FLUSH
10.0000 mL | Freq: Once | INTRAVENOUS | Status: AC
  Administered 2020-03-21: 10 mL
  Filled 2020-03-21: qty 10

## 2020-03-21 MED ORDER — HEPARIN SOD (PORK) LOCK FLUSH 100 UNIT/ML IV SOLN
500.0000 [IU] | Freq: Once | INTRAVENOUS | Status: AC
  Administered 2020-03-21: 500 [IU]
  Filled 2020-03-21: qty 5

## 2020-03-22 ENCOUNTER — Telehealth: Payer: Self-pay | Admitting: Oncology

## 2020-03-22 ENCOUNTER — Other Ambulatory Visit: Payer: Self-pay | Admitting: Oncology

## 2020-03-22 DIAGNOSIS — C9110 Chronic lymphocytic leukemia of B-cell type not having achieved remission: Secondary | ICD-10-CM

## 2020-03-22 LAB — IGG, IGA, IGM
IgA: 26 mg/dL — ABNORMAL LOW (ref 87–352)
IgG (Immunoglobin G), Serum: 493 mg/dL — ABNORMAL LOW (ref 586–1602)
IgM (Immunoglobulin M), Srm: <5 mg/dL — ABNORMAL LOW (ref 26–217)

## 2020-03-22 NOTE — Telephone Encounter (Signed)
Scheduled appts per 7/12 los. Pt confirmed appt date and time.  

## 2020-03-29 MED FILL — VENCLEXTA 100 MG TABS: 100 | 30 days supply | Qty: 60 | Fill #0

## 2020-04-14 NOTE — Addendum Note (Signed)
Addended by: Tora Kindred on: 04/14/2020 09:53 AM   Modules accepted: Orders

## 2020-04-19 NOTE — Progress Notes (Signed)
[place]  [phone] [phone]    ID: SKYLIN KENNERSON   DOB: [birth year]  MR#: [id number]  DDU#:[id number]  Patient Care Team: Marton Redwood, MD as PCP - General (Internal Medicine) [name], MD as Consulting Physician (Orthopedic Surgery) Ceciley Buist, [name] Dad, MD as Consulting Physician (Oncology) [name], MD as Consulting Physician (General Surgery) [name], [name], NP as Nurse Practitioner (Hematology and Oncology) OTHER MD:  CHIEF COMPLAINT: Small lymphocytic lymphoma/chronic lymphocytic leukemia  CURRENT TREATMENT: venetoclax, rivaroxaban, IVIG   INTERVAL HISTORY: Geraldyn returns today for follow-up and treatment of her small lymphocytic lymphoma/chronic lymphocytic leukemia.   She continues on venetoclax 200mg  daily.  She is not aware of any side effects from this medication at present.  She is obtaining at reasonable cost.  She also continues on rivaroxaban.  She is also able to obtain this at an affordable price.  She is having no bleeding or bruising complications so far.  She receives IVIG prophylactically every 12 weeks.  She has a dose due today.  Since her last visit here, has not undergone any additional studies. She did undergo right eye cataract surgery at [facility] on [facility]04/2068 (left eye procedure was performed on 03/07/2020).  Lab Results  Component Value Date   IGGSERUM 493 (L) 03/21/2020   IGGSERUM 235 (L) 01/05/2020   IGGSERUM 280 (L) 12/14/2019   IGA 26 (L) 03/21/2020   IGA 26 (L) 01/05/2020   IGA 26 (L) 12/14/2019   IGMSERUM <5 (L) 03/21/2020   IGMSERUM <5 (L) 01/05/2020   IGMSERUM <5 (L) 12/14/2019      REVIEW OF SYSTEMS: [facility].  She does general exercise classes, some yoga, and is planning to take a fall class which will teach her how to get up if she falls down which she notes she does not.  She has not been taking Lasix for a while and now  that she is more active she may not need it.  She has not noted any ankle or other swelling.  She has had no drenching sweats, no fever, no adenopathy, and no unusual fatigue or weight loss.  A detailed review of systems today was stable   HISTORY OF PRESENT ILLNESS: From the original intake note:  [name] developed what she thought was "the flu" in December of 2002. She noted a large lymph node developing in her right anterior cervical area. 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   Her subsequent history is as detailed above.   PAST MEDICAL HISTORY: Past Medical History:  Diagnosis Date  . Allergy   . Arthritis   . Asthma   . Cancer [facility])    chronic lymphacytic leukemia  . CLL (chronic lymphocytic leukemia) ([place])   . Diabetes mellitus    PMH  . Headache    migraine  . Hyperlipidemia   . Hypertension   . Hypothyroidism   . Lymphoma ([place])   . Thyroid disease    hypothyroidism  . Wears glasses     PAST SURGICAL HISTORY: Past Surgical History:  Procedure Laterality Date  . ABDOMINAL HYSTERECTOMY    . AXILLARY LYMPH NODE BIOPSY Right 08/21/2018   Procedure: RIGHT AXILLARY LYMPH NODE BIOPSY ERAS PATHWAY;  Surgeon: [name], MD;  Location: [facility];  Service: General;  Laterality: Right;  LMA  . BREAST EXCISIONAL BIOPSY Right   . COLONOSCOPY    . IR  IMAGING GUIDED PORT INSERTION  11/10/2018  . LYMPH NODE DISSECTION    . STRABISMUS SURGERY    . TONSILLECTOMY    . TOTAL KNEE ARTHROPLASTY Right 12/03/2016   Procedure: RIGHT TOTAL KNEE ARTHROPLASTY;  Surgeon: [name], MD;  Location: [facility];  Service: Orthopedics;  Laterality: Right;    FAMILY HISTORY: Family History  Problem Relation Age of Onset  . Liver cancer Mother   . Heart disease Father   . Breast cancer Sister 10  . Colon cancer Neg Hx   . Pancreatic cancer Neg Hx   . Rectal cancer Neg Hx   . Stomach cancer Neg Hx     The patient's father died at the age of 67 from a myocardial infarction. The patient's mother died at the age of 33 from primary liver carcinoma. The patient had no brothers. Her one sister, [name], has a history of breast cancer   GYNECOLOGIC HISTORY: Menarche at around age 63. The patient is GX P0. 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 She lives by herself. She had two cats, [name] and Stevinson, but they are currently with her sister and Zali thinks they would be more at home there. She attends a Investment banker, operational.               ADVANCED DIRECTIVES: Completed 02/03/2019. She has named her niece as her 21.   HEALTH MAINTENANCE: Social History   Socioeconomic History  . Marital status: Single    Spouse name: Not on file  . Number of children: Not on file  . Years of education: Not on file  . Highest education level: Not on file  Occupational History  . Not on file  Tobacco Use  . Smoking status: Never Smoker  . Smokeless tobacco: Never Used  Vaping Use  . Vaping Use: Never used  Substance and Sexual Activity  . Alcohol use: Yes    Comment: occasional alcohol intake; once or twice monthly  . Drug use: No  . Sexual activity: Not on file  Other Topics Concern  . Not on file  Social History Narrative  . Not on file   Social Determinants of Health   Financial Resource Strain:   . Difficulty of Paying Living Expenses:   Food Insecurity:   . Worried About Charity fundraiser in the Last Year:   . Arboriculturist in the Last Year:   Transportation Needs:   . Film/video editor (Medical):   Marland Kitchen Lack of Transportation (Non-Medical):   Physical Activity:   . Days of Exercise per Week:   . Minutes of Exercise per Session:   Stress:   . Feeling of Stress :   Social Connections:   .  Frequency of Communication with Friends and Family:   . Frequency of Social Gatherings with Friends and Family:   . Attends Religious Services:   . Active Member of Clubs or Organizations:   . Attends Archivist Meetings:   Marland Kitchen Marital Status:   Intimate Partner Violence:   . Fear of Current or Ex-Partner:   . Emotionally Abused:   Marland Kitchen Physically Abused:   . Sexually Abused:                 Colonoscopy: 06/01/2014/[facility]  PAP:             Bone density:             Lipid panel:  228/114/50/155/ratio 4.6  On 10/01/2016  ALLERGIES: Allergies  Allergen Reactions  . Iohexol Swelling and Rash  . Compazine [Prochlorperazine Edisylate] Other (See Comments)    Mild confusion  . Contrast Media  [Iodinated Diagnostic Agents]   . Lactose Intolerance (Gi)     Diarrhea, gas bloating  . Cephalexin Rash    CURRENT MEDICATIONS: Current Outpatient Medications  Medication Sig Dispense Refill  . acetaminophen (TYLENOL 8 HOUR ARTHRITIS PAIN) 650 MG CR tablet Take 650 mg by mouth every 8 (eight) hours as needed for pain.    . furosemide (LASIX) 20 MG tablet TAKE 1 TABLET BY MOUTH EVERY DAY 90 tablet 1  . KLOR-CON M20 20 MEQ tablet TAKE 1 TABLET BY MOUTH EVERY DAY 90 tablet 1  . ondansetron (ZOFRAN) 8 MG tablet Take 0.5-1 tablets (4-8 mg total) by mouth every 8 (eight) hours as needed for nausea or vomiting. 30 tablet 0  . rosuvastatin (CRESTOR) 10 MG tablet Take 10 mg by mouth at bedtime.  11  . SYNTHROID 88 MCG tablet Take 88 mcg by mouth daily before breakfast.     . valACYclovir (VALTREX) 1000 MG tablet Take 1 tablet (1,000 mg total) by mouth daily. 90 tablet 6  . VENCLEXTA 100 MG TABS TAKE 2 TABS (200 MG) BY MOUTH DAILY. TAKE WITH FOOD AND WATER AT APPROXIMATELY THE SAME TIME EACH DAY. 60 tablet 2  . XARELTO 20 MG TABS tablet TAKE 1 TABLET (20 MG TOTAL) BY MOUTH DAILY WITH SUPPER. 90 tablet 1  . Zoledronic Acid (RECLAST IV) Inject into the vein. Once a year     No current  facility-administered medications for this visit.    OBJECTIVE: white woman who appears well Vitals:   04/20/20 0820  BP: 134/62  Pulse: 67  Resp: 18  Temp: (!) 96.4 F (35.8 C)  SpO2: 100%   Wt Readings from Last 3 Encounters:  04/20/20 192 lb 12.8 oz (87.5 kg)  03/21/20 189 lb 1.6 oz (85.8 kg)  01/05/20 188 lb 14.4 oz (85.7 kg)   Body mass index is 31.12 kg/m.    ECOG FS:1 - Symptomatic but completely ambulatory  Sclerae unicteric, EOMs intact Wearing a mask No cervical or supraclavicular adenopathy, no axillary or inguinal adenopathy Lungs no rales or rhonchi Heart regular rate and rhythm Abd soft, nontender, positive bowel sounds MSK no focal spinal tenderness, no upper extremity lymphedema Neuro: nonfocal, well oriented, appropriate affect Breasts: Deferred   LAB RESULTS: Lab Results  Component Value Date   WBC 3.4 (L) 04/20/2020   NEUTROABS 1.8 04/20/2020   HGB 12.2 04/20/2020   HCT 38.0 04/20/2020   MCV 88.8 04/20/2020   PLT 148 (L) 04/20/2020   CMP Latest Ref Rng & Units 03/21/2020 01/05/2020 12/14/2019  Glucose 70 - 99 mg/dL 96 103(H) 101(H)  BUN 8 - 23 mg/dL 15 16 15   Creatinine 0.44 - 1.00 mg/dL 0.83 0.85 0.93  Sodium 135 - 145 mmol/L 142 139 143  Potassium 3.5 - 5.1 mmol/L 4.2 4.0 4.2  Chloride 98 - 111 mmol/L 108 107 107  CO2 22 - 32 mmol/L 25 25 26   Calcium 8.9 - 10.3 mg/dL 9.8 9.5 9.6  Total Protein 6.5 - 8.1 g/dL 6.5 6.2(L) 6.3(L)  Total Bilirubin 0.3 - 1.2 mg/dL 0.5 0.5 0.7  Alkaline Phos 38 - 126 U/L  118 115 161(H)  AST 15 - 41 U/L 24 18 56(H)  ALT 0 - 44 U/L 24 16 63(H)    No results for input(s): LABCA2 in the last 72 hours.  Lab Results  Component Value Date   IGGSERUM 493 (L) 03/21/2020   IGGSERUM 235 (L) 01/05/2020   IGGSERUM 280 (L) 12/14/2019   IGA 26 (L) 03/21/2020   IGA 26 (L) 01/05/2020   IGA 26 (L) 12/14/2019   IGMSERUM <5 (L) 03/21/2020   IGMSERUM <5 (L) 01/05/2020   IGMSERUM <5 (L) 12/14/2019     STUDIES: No  results found.   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             (b) rituximab added to ibrutinib, first dose 08/27/2018             (c) hepatitis B studies 08/27/2018 negative             (f) ibrutinib/rituximab discontinued after 10/02/2018 dose w/o obvious response  (6) bendamustine/rituximab started 10/22/2018, discontinued after 1 cycle with rapid progression  (7) obinutuzumab/Gaziva started 11/11/2018  (a) second dose given 11/19/2018 together with chemotherapy  (b) third dose given 12/26/2018 and continuing every 3 weeks  (c) obinutuzumab discontinued after 09/09/2019 dose, with documentation of complete remission  (8) CH[O]P chemotherapy started 11/19/2018, completed 8 doses 05/19/2019             (a) echocardiogram on 11/07/2018 shows EF of 55-60%  (b) second cycle of CH[O]P postponed because of pandemic, given 12/26/2018, greatly reduced doses  (c) cycle 3 of CH[O]P/obinutuzumab given 02/03/2019 at increased doses  (d) cycle 4 and 5 of CH[O]P given on 6/16 and  7/7 at increased doses of 35mg /m2 and 550mg /m2  (e) Repeat echo on 04/22/2019 shows an EF of 60-65%  (9) severe immunocompromise: Marked hypogammaglobulinemia  (a) starting March 2020 she received IVIG every 8 weeks as needed  (b) starting September 2021 she receives IVIG every 3 months  (10) 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  (a) LMWH started 04/23/2019  (b) 75% dose reduction September 2020 secondary to bruising  (c) switched to rivaroxaban 09/10/2019  (d) 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  We are going to restage her lymphoma/leukemia in late November and at the December visit we will decide whether we want to go off the venetoclax and start observation to give her a treatment break.  She will also have a Doppler ultrasound late November.  If the clot has completely resolved we will consider going off the rivaroxaban.  We will obtain a D-dimer that same day to help [place] with that decision.  She is going to be going to a wedding in the [place].  There will be people there who refused to be vaccinated.  I have asked her to stay well away from those people.  She will be a little bit safer in outside activities and if there is a restaurant that they go to if she could sit outside that would be preferable.  In any case once she comes back she needs to test herself daily for 4 days and if she turns positive she will immediately call us and we will try to get her the antibodies.  She understands that as far as antibodies is concerned as she is immunosuppressed and has no protection  against the virus  She knows to call for any other issue that may develop before the next visit.  Total encounter time 35 minutes.Sarajane Jews C. Kiyoshi Schaab, MD 04/20/20 8:48 AM Medical Oncology and Hematology [facility] [place], [place] [address] Tel. [phone]    Fax. [phone]   I, Wilburn Mylar, am acting as scribe for Dr. [name] Dad. Rylen Hou.  I, Lurline Del MD, have reviewed the above documentation for accuracy and completeness, and I agree with the above.    *Total Encounter Time as defined by the Centers for Medicare and Medicaid Services includes, in addition to the face-to-face time of a patient visit (documented in the note above) non-face-to-face time: obtaining and reviewing outside history, ordering and reviewing medications, tests or procedures, care coordination (communications with other health care professionals or caregivers) and documentation in the medical record.

## 2020-04-20 ENCOUNTER — Inpatient Hospital Stay: Payer: Medicare PPO

## 2020-04-20 ENCOUNTER — Inpatient Hospital Stay: Payer: Medicare PPO | Admitting: Oncology

## 2020-04-20 ENCOUNTER — Telehealth: Payer: Self-pay | Admitting: Oncology

## 2020-04-20 ENCOUNTER — Other Ambulatory Visit: Payer: Self-pay

## 2020-04-20 ENCOUNTER — Inpatient Hospital Stay: Payer: Medicare PPO | Attending: Oncology

## 2020-04-20 VITALS — BP 148/68 | HR 62 | Temp 98.0°F | Resp 18

## 2020-04-20 VITALS — BP 134/62 | HR 67 | Temp 96.4°F | Resp 18 | Ht 66.0 in | Wt 192.8 lb

## 2020-04-20 DIAGNOSIS — D801 Nonfamilial hypogammaglobulinemia: Secondary | ICD-10-CM

## 2020-04-20 DIAGNOSIS — C9110 Chronic lymphocytic leukemia of B-cell type not having achieved remission: Secondary | ICD-10-CM | POA: Diagnosis not present

## 2020-04-20 DIAGNOSIS — Z95828 Presence of other vascular implants and grafts: Secondary | ICD-10-CM

## 2020-04-20 LAB — CBC WITH DIFFERENTIAL/PLATELET
Abs Immature Granulocytes: 0.01 10*3/uL (ref 0.00–0.07)
Basophils Absolute: 0 10*3/uL (ref 0.0–0.1)
Basophils Relative: 1 %
Eosinophils Absolute: 0 10*3/uL (ref 0.0–0.5)
Eosinophils Relative: 1 %
HCT: 38 % (ref 36.0–46.0)
Hemoglobin: 12.2 g/dL (ref 12.0–15.0)
Immature Granulocytes: 0 %
Lymphocytes Relative: 29 %
Lymphs Abs: 1 10*3/uL (ref 0.7–4.0)
MCH: 28.5 pg (ref 26.0–34.0)
MCHC: 32.1 g/dL (ref 30.0–36.0)
MCV: 88.8 fL (ref 80.0–100.0)
Monocytes Absolute: 0.5 10*3/uL (ref 0.1–1.0)
Monocytes Relative: 16 %
Neutro Abs: 1.8 10*3/uL (ref 1.7–7.7)
Neutrophils Relative %: 53 %
Platelets: 148 10*3/uL — ABNORMAL LOW (ref 150–400)
RBC: 4.28 MIL/uL (ref 3.87–5.11)
RDW: 14.7 % (ref 11.5–15.5)
WBC: 3.4 10*3/uL — ABNORMAL LOW (ref 4.0–10.5)
nRBC: 0 % (ref 0.0–0.2)

## 2020-04-20 LAB — LACTATE DEHYDROGENASE: LDH: 159 U/L (ref 98–192)

## 2020-04-20 LAB — COMPREHENSIVE METABOLIC PANEL
ALT: 13 U/L (ref 0–44)
AST: 20 U/L (ref 15–41)
Albumin: 3.8 g/dL (ref 3.5–5.0)
Alkaline Phosphatase: 120 U/L (ref 38–126)
Anion gap: 8 (ref 5–15)
BUN: 13 mg/dL (ref 8–23)
CO2: 24 mmol/L (ref 22–32)
Calcium: 9.8 mg/dL (ref 8.9–10.3)
Chloride: 109 mmol/L (ref 98–111)
Creatinine, Ser: 0.83 mg/dL (ref 0.44–1.00)
GFR calc Af Amer: >60 mL/min (ref >60)
GFR calc non Af Amer: >60 mL/min (ref >60)
Glucose, Bld: 123 mg/dL — ABNORMAL HIGH (ref 70–99)
Potassium: 4 mmol/L (ref 3.5–5.1)
Sodium: 141 mmol/L (ref 135–145)
Total Bilirubin: 0.5 mg/dL (ref 0.3–1.2)
Total Protein: 6 g/dL — ABNORMAL LOW (ref 6.5–8.1)

## 2020-04-20 MED ORDER — DEXTROSE 5 % IV SOLN
INTRAVENOUS | Status: DC
  Filled 2020-04-20: qty 250

## 2020-04-20 MED ORDER — SODIUM CHLORIDE 0.9% FLUSH
10.0000 mL | Freq: Once | INTRAVENOUS | Status: AC
  Administered 2020-04-20: 10 mL
  Filled 2020-04-20: qty 10

## 2020-04-20 MED ORDER — VALACYCLOVIR HCL 1 G PO TABS: 1000.0000 mg | ORAL_TABLET | Freq: Every day | ORAL | 6 refills | Status: DC

## 2020-04-20 MED ORDER — DIPHENHYDRAMINE HCL 25 MG PO CAPS
25.0000 mg | ORAL_CAPSULE | Freq: Once | ORAL | Status: AC
  Administered 2020-04-20: 25 mg via ORAL

## 2020-04-20 MED ORDER — HEPARIN SOD (PORK) LOCK FLUSH 100 UNIT/ML IV SOLN
500.0000 [IU] | Freq: Once | INTRAVENOUS | Status: AC
  Administered 2020-04-20: 500 [IU]
  Filled 2020-04-20: qty 5

## 2020-04-20 MED ORDER — ACETAMINOPHEN 325 MG PO TABS
650.0000 mg | ORAL_TABLET | Freq: Once | ORAL | Status: AC
  Administered 2020-04-20: 650 mg via ORAL

## 2020-04-20 MED ORDER — VENCLEXTA 100 MG PO TABS: ORAL_TABLET | ORAL | 2 refills | Status: DC

## 2020-04-20 MED ORDER — IMMUNE GLOBULIN (HUMAN) 10 GM/100ML IV SOLN
80.0000 g | Freq: Once | INTRAVENOUS | Status: AC
  Administered 2020-04-20: 80 g via INTRAVENOUS
  Filled 2020-04-20: qty 800

## 2020-04-20 MED ORDER — DIPHENHYDRAMINE HCL 25 MG PO CAPS
ORAL_CAPSULE | ORAL | Status: AC
  Filled 2020-04-20: qty 1

## 2020-04-20 MED ORDER — RIVAROXABAN 20 MG PO TABS: ORAL_TABLET | ORAL | 1 refills | Status: DC

## 2020-04-20 MED ORDER — ACETAMINOPHEN 325 MG PO TABS
ORAL_TABLET | ORAL | Status: AC
  Filled 2020-04-20: qty 2

## 2020-04-20 NOTE — Patient Instructions (Signed)
Immune Globulin Injection What is this medicine? IMMUNE GLOBULIN (im MUNE GLOB yoo lin) helps to prevent or reduce the severity of certain infections in patients who are at risk. This medicine is collected from the pooled blood of many donors. It is used to treat immune system problems, thrombocytopenia, and Kawasaki syndrome. This medicine may be used for other purposes; ask your health care provider or pharmacist if you have questions. COMMON BRAND NAME(S): ASCENIV, Baygam, BIVIGAM, Carimune, Carimune NF, cutaquig, Cuvitru, Flebogamma, Flebogamma DIF, GamaSTAN, GamaSTAN S/D, Gamimune N, Gammagard, Gammagard S/D, Gammaked, Gammaplex, Gammar-P IV, Gamunex, Gamunex-C, Hizentra, Iveegam, Iveegam EN, Octagam, Panglobulin, Panglobulin NF, panzyga, Polygam S/D, Privigen, Sandoglobulin, Venoglobulin-S, Vigam, Vivaglobulin, Xembify What should I tell my health care provider before I take this medicine? They need to know if you have any of these conditions:  diabetes  extremely low or no immune antibodies in the blood  heart disease  history of blood clots  hyperprolinemia  infection in the blood, sepsis  kidney disease  recently received or scheduled to receive a vaccination  an unusual or allergic reaction to human immune globulin, albumin, maltose, sucrose, other medicines, foods, dyes, or preservatives  pregnant or trying to get pregnant  breast-feeding How should I use this medicine? This medicine is for injection into a muscle or infusion into a vein or skin. It is usually given by a health care professional in a hospital or clinic setting. In rare cases, some brands of this medicine might be given at home. You will be taught how to give this medicine. Use exactly as directed. Take your medicine at regular intervals. Do not take your medicine more often than directed. Talk to your pediatrician regarding the use of this medicine in children. While this drug may be prescribed for selected  conditions, precautions do apply. Overdosage: If you think you have taken too much of this medicine contact a poison control center or emergency room at once. NOTE: This medicine is only for you. Do not share this medicine with others. What if I miss a dose? It is important not to miss your dose. Call your doctor or health care professional if you are unable to keep an appointment. If you give yourself the medicine and you miss a dose, take it as soon as you can. If it is almost time for your next dose, take only that dose. Do not take double or extra doses. What may interact with this medicine?  aspirin and aspirin-like medicines  cisplatin  cyclosporine  medicines for infection like acyclovir, adefovir, amphotericin B, bacitracin, cidofovir, foscarnet, ganciclovir, gentamicin, pentamidine, vancomycin  NSAIDS, medicines for pain and inflammation, like ibuprofen or naproxen  pamidronate  vaccines  zoledronic acid This list may not describe all possible interactions. Give your health care provider a list of all the medicines, herbs, non-prescription drugs, or dietary supplements you use. Also tell them if you smoke, drink alcohol, or use illegal drugs. Some items may interact with your medicine. What should I watch for while using this medicine? Your condition will be monitored carefully while you are receiving this medicine. This medicine is made from pooled blood donations of many different people. It may be possible to pass an infection in this medicine. However, the donors are screened for infections and all products are tested for HIV and hepatitis. The medicine is treated to kill most or all bacteria and viruses. Talk to your doctor about the risks and benefits of this medicine. Do not have vaccinations for at least   14 days before, or until at least 3 months after receiving this medicine. What side effects may I notice from receiving this medicine? Side effects that you should report  to your doctor or health care professional as soon as possible:  allergic reactions like skin rash, itching or hives, swelling of the face, lips, or tongue  blue colored lips or skin  breathing problems  chest pain or tightness  fever  signs and symptoms of aseptic meningitis such as stiff neck; sensitivity to light; headache; drowsiness; fever; nausea; vomiting; rash  signs and symptoms of a blood clot such as chest pain; shortness of breath; pain, swelling, or warmth in the leg  signs and symptoms of hemolytic anemia such as fast heartbeat; tiredness; dark yellow or brown urine; or yellowing of the eyes or skin  signs and symptoms of kidney injury like trouble passing urine or change in the amount of urine  sudden weight gain  swelling of the ankles, feet, hands Side effects that usually do not require medical attention (report to your doctor or health care professional if they continue or are bothersome):  diarrhea  flushing  headache  increased sweating  joint pain  muscle cramps  muscle pain  nausea  pain, redness, or irritation at site where injected  tiredness This list may not describe all possible side effects. Call your doctor for medical advice about side effects. You may report side effects to FDA at 1-800-FDA-1088. Where should I keep my medicine? Keep out of the reach of children. This drug is usually given in a hospital or clinic and will not be stored at home. In rare cases, some brands of this medicine may be given at home. If you are using this medicine at home, you will be instructed on how to store this medicine. Throw away any unused medicine after the expiration date on the label. NOTE: This sheet is a summary. It may not cover all possible information. If you have questions about this medicine, talk to your doctor, pharmacist, or health care provider.  2020 Elsevier/Gold Standard (2019-04-01 12:51:14)  

## 2020-04-20 NOTE — Telephone Encounter (Signed)
Scheduled appts per 8/11 los. Pt declined print out of AVS and stated she would refer to mychart.  

## 2020-04-21 LAB — IGG, IGA, IGM
IgA: 25 mg/dL — ABNORMAL LOW (ref 87–352)
IgG (Immunoglobin G), Serum: 345 mg/dL — ABNORMAL LOW (ref 586–1602)
IgM (Immunoglobulin M), Srm: <5 mg/dL — ABNORMAL LOW (ref 26–217)

## 2020-04-27 MED FILL — VENCLEXTA 100 MG TABS: 100 | 30 days supply | Qty: 60 | Fill #1

## 2020-05-19 ENCOUNTER — Ambulatory Visit: Payer: Medicare PPO | Admitting: Oncology

## 2020-05-19 ENCOUNTER — Other Ambulatory Visit: Payer: Medicare PPO

## 2020-05-19 ENCOUNTER — Inpatient Hospital Stay: Payer: Medicare PPO

## 2020-05-26 MED FILL — VENCLEXTA 100 MG TABS: 100 | 30 days supply | Qty: 60 | Fill #2

## 2020-06-05 ENCOUNTER — Other Ambulatory Visit: Payer: Self-pay | Admitting: Oncology

## 2020-06-21 ENCOUNTER — Other Ambulatory Visit: Payer: Self-pay | Admitting: Oncology

## 2020-06-21 DIAGNOSIS — C9110 Chronic lymphocytic leukemia of B-cell type not having achieved remission: Secondary | ICD-10-CM

## 2020-06-22 ENCOUNTER — Other Ambulatory Visit: Payer: Self-pay | Admitting: Oncology

## 2020-06-24 ENCOUNTER — Other Ambulatory Visit: Payer: Self-pay | Admitting: Oncology

## 2020-06-27 ENCOUNTER — Other Ambulatory Visit (HOSPITAL_COMMUNITY): Payer: Self-pay

## 2020-06-27 NOTE — Discharge Instructions (Signed)

## 2020-06-28 ENCOUNTER — Other Ambulatory Visit: Payer: Self-pay

## 2020-06-28 ENCOUNTER — Ambulatory Visit (HOSPITAL_COMMUNITY)
Admission: RE | Admit: 2020-06-28 | Discharge: 2020-06-28 | Disposition: A | Discharge status: Home / Self Care | Payer: Medicare PPO | Source: Ambulatory Visit | Attending: Internal Medicine | Admitting: Internal Medicine

## 2020-06-28 DIAGNOSIS — M810 Age-related osteoporosis without current pathological fracture: Secondary | ICD-10-CM | POA: Diagnosis not present

## 2020-06-28 MED ORDER — ZOLEDRONIC ACID 5 MG/100ML IV SOLN
INTRAVENOUS | Status: AC
  Administered 2020-06-28: 5 mg via INTRAVENOUS
  Filled 2020-06-28: qty 100

## 2020-06-28 MED ORDER — ZOLEDRONIC ACID 5 MG/100ML IV SOLN: 5.0000 mg | Freq: Once | INTRAVENOUS | Status: AC

## 2020-06-29 ENCOUNTER — Telehealth: Payer: Self-pay

## 2020-06-29 MED FILL — VENCLEXTA 100 MG TABS: 100 | 30 days supply | Qty: 60 | Fill #0

## 2020-06-29 NOTE — Telephone Encounter (Signed)
Oral Oncology Patient Advocate Encounter  Was successful in securing patient a $8000 grant from [employer] to provide copayment coverage for Venclexta.  This will keep the out of pocket expense at $0.     [employer] ID: [id number]  I have spoken with the patient.   The billing information is as follows and has been shared with [facility].    RxBin: [id number] PCN: [id number] Member ID: [id number] Group ID: [id number] Dates of Eligibility: 06/02/20 through 06/01/20  Fund:  [place] Patient [place] Phone [phone] Fax [phone] 06/29/2020 8:17 AM

## 2020-07-01 ENCOUNTER — Other Ambulatory Visit: Payer: Self-pay

## 2020-07-01 ENCOUNTER — Ambulatory Visit (HOSPITAL_COMMUNITY)
Admission: RE | Admit: 2020-07-01 | Discharge: 2020-07-01 | Disposition: A | Discharge status: Home / Self Care | Payer: Medicare PPO | Source: Ambulatory Visit | Attending: Oncology | Admitting: Oncology

## 2020-07-01 DIAGNOSIS — C9110 Chronic lymphocytic leukemia of B-cell type not having achieved remission: Secondary | ICD-10-CM | POA: Diagnosis not present

## 2020-07-01 NOTE — Progress Notes (Signed)
Lower extremity venous has been completed.   Preliminary results in CV Proc.   Abram Sander 07/01/2020 10:56 AM

## 2020-07-25 ENCOUNTER — Other Ambulatory Visit: Payer: Self-pay

## 2020-07-25 ENCOUNTER — Ambulatory Visit (HOSPITAL_COMMUNITY)
Admission: RE | Admit: 2020-07-25 | Discharge: 2020-07-25 | Disposition: A | Discharge status: Home / Self Care | Payer: Medicare PPO | Source: Ambulatory Visit | Attending: Oncology | Admitting: Oncology

## 2020-07-25 DIAGNOSIS — I2510 Atherosclerotic heart disease of native coronary artery without angina pectoris: Secondary | ICD-10-CM | POA: Insufficient documentation

## 2020-07-25 DIAGNOSIS — K8020 Calculus of gallbladder without cholecystitis without obstruction: Secondary | ICD-10-CM | POA: Diagnosis not present

## 2020-07-25 DIAGNOSIS — D696 Thrombocytopenia, unspecified: Secondary | ICD-10-CM | POA: Diagnosis not present

## 2020-07-25 DIAGNOSIS — D801 Nonfamilial hypogammaglobulinemia: Secondary | ICD-10-CM | POA: Diagnosis not present

## 2020-07-25 DIAGNOSIS — T451X5A Adverse effect of antineoplastic and immunosuppressive drugs, initial encounter: Secondary | ICD-10-CM

## 2020-07-25 DIAGNOSIS — I700 Atherosclerosis of aorta: Secondary | ICD-10-CM | POA: Diagnosis not present

## 2020-07-25 DIAGNOSIS — G620 Drug-induced polyneuropathy: Secondary | ICD-10-CM | POA: Insufficient documentation

## 2020-07-25 DIAGNOSIS — C9110 Chronic lymphocytic leukemia of B-cell type not having achieved remission: Secondary | ICD-10-CM

## 2020-07-25 LAB — GLUCOSE, CAPILLARY: Glucose-Capillary: 110 mg/dL — ABNORMAL HIGH (ref 70–99)

## 2020-07-25 MED ORDER — FLUDEOXYGLUCOSE F - 18 (FDG) INJECTION
9.5000 | Freq: Once | INTRAVENOUS | Status: AC
  Administered 2020-07-25: 9.5 via INTRAVENOUS

## 2020-07-27 MED FILL — VENCLEXTA 100 MG TABS: 100 | 30 days supply | Qty: 60 | Fill #1

## 2020-08-10 NOTE — Progress Notes (Signed)
[place]  [phone] [phone]    ID: Maria [name]   DOB: March 24, 1951  MR#: [id number]  JHE#:[id number]  Patient Care Team: Marton Redwood, MD as PCP - General (Internal Medicine) [name], MD as Consulting Physician (Orthopedic Surgery) Jiayi Lengacher, [name] Dad, MD as Consulting Physician (Oncology) [name], MD as Consulting Physician (General Surgery) Causey, [name], NP as Nurse Practitioner (Hematology and Oncology) OTHER MD:  CHIEF COMPLAINT: Small lymphocytic lymphoma/chronic lymphocytic leukemia  CURRENT TREATMENT: venetoclax, IVIG   INTERVAL HISTORY: [name] returns today for follow-up and treatment of her small lymphocytic lymphoma/chronic lymphocytic leukemia.   Since her last visit, she underwent restaging PET scan on 07/25/2020 showing: small residual, minimally-hypermetabolic periportal lymph nodes; no additional hypermetabolic lymph nodes.  She continues on venetoclax 200mg  daily.  She thinks this may be causing her some joint stiffness and she feels kind of stiff all over most of the time she says.  She occasionally takes Tylenol for this.  She also continues on rivaroxaban.  She is also able to obtain this at an affordable price.  She is having no bleeding or bruising complications so far.  She had a Doppler study 07/01/2020 which showed no residual clot and she had a D-dimer today  She receives IVIG prophylactically every 12 weeks.  She has a dose due today.   Lab Results  Component Value Date   IGGSERUM 345 (L) 04/20/2020   IGGSERUM 493 (L) 03/21/2020   IGGSERUM 235 (L) 01/05/2020   IGA 25 (L) 04/20/2020   IGA 26 (L) 03/21/2020   IGA 26 (L) 01/05/2020   IGMSERUM <5 (L) 04/20/2020   IGMSERUM <5 (L) 03/21/2020   IGMSERUM <5 (L) 01/05/2020      REVIEW OF SYSTEMS: [name] tells me she had antibody testing after her Pfizer vaccine and booster since she had no antibodies.  She has gained a  little weight partly because of the season, where she has a looser diet, partly because of lack of exercise.  She is now [facility] to the senior center and enjoying it.  She would like to do yoga a couple of times a week as well.  She has had no fevers, no drenching sweats, has noted no adenopathy, there has been no pruritus or rash, and she has actually gained a little bit of weight as noted   COVID 19 VACCINATION STATUS: Status post Pfizer vaccine x2+ booster August 2021, with subsequent antibody testing negative (no response to vaccines)   HISTORY OF PRESENT ILLNESS: From the original intake note:  Maria [name] developed what she thought was "the flu" in December of 2002. She noted a large lymph node developing in her right anterior cervical area. 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   Her subsequent history is as detailed above.   PAST MEDICAL HISTORY: Past Medical History:  Diagnosis Date  . Allergy   . Arthritis   . Asthma   . Cancer [facility])    chronic lymphacytic leukemia  . CLL (chronic lymphocytic leukemia) ([place])   . Diabetes mellitus    PMH  . Headache    migraine  . Hyperlipidemia   . Hypertension   . Hypothyroidism   . Lymphoma ([place])   . Thyroid disease    hypothyroidism  . Wears glasses     PAST SURGICAL HISTORY: Past Surgical History:  Procedure Laterality Date  . ABDOMINAL HYSTERECTOMY    . AXILLARY LYMPH  NODE BIOPSY Right 08/21/2018   Procedure: RIGHT AXILLARY LYMPH NODE BIOPSY ERAS PATHWAY;  Surgeon: [name], MD;  Location: [facility];  Service: General;  Laterality: Right;  LMA  . BREAST EXCISIONAL BIOPSY Right   . COLONOSCOPY    . IR IMAGING GUIDED PORT INSERTION  11/10/2018  . LYMPH NODE DISSECTION    . STRABISMUS SURGERY    . TONSILLECTOMY    . TOTAL KNEE ARTHROPLASTY Right 12/03/2016   Procedure: RIGHT TOTAL KNEE ARTHROPLASTY;  Surgeon: [name], MD;  Location: [facility];   Service: Orthopedics;  Laterality: Right;    FAMILY HISTORY: Family History  Problem Relation Age of Onset  . Liver cancer Mother   . Heart disease Father   . Breast cancer Sister 71  . Colon cancer Neg Hx   . Pancreatic cancer Neg Hx   . Rectal cancer Neg Hx   . Stomach cancer Neg Hx    The patient's father died at the age of 54 from a myocardial infarction. The patient's mother died at the age of 88 from primary liver carcinoma. The patient had no brothers. Her one sister, [name], has a history of breast cancer   GYNECOLOGIC HISTORY: Menarche at around age 56. The patient is GX P0. 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 She lives by herself. She had two cats, [name] and [place], but they are currently with her sister and Anatalia thinks they would be more at home there. She attends a Investment banker, operational.               ADVANCED DIRECTIVES: Completed 02/03/2019. She has named her niece as her 51.   HEALTH MAINTENANCE: Social History   Socioeconomic History  . Marital status: Single    Spouse name: Not on file  . Number of children: Not on file  . Years of education: Not on file  . Highest education level: Not on file  Occupational History  . Not on file  Tobacco Use  . Smoking status: Never Smoker  . Smokeless tobacco: Never Used  Vaping Use  . Vaping Use: Never used  Substance and Sexual Activity  . Alcohol use: Yes    Comment: occasional alcohol intake; once or twice monthly  . Drug use: No  . Sexual activity: Not on file  Other Topics Concern  . Not on file  Social History Narrative  . Not on file   Social Determinants of Health   Financial Resource Strain:   . Difficulty of Paying Living Expenses: Not on file  Food Insecurity:   . Worried About Charity fundraiser in the Last  Year: Not on file  . Ran Out of Food in the Last Year: Not on file  Transportation Needs:   . Lack of Transportation (Medical): Not on file  . Lack of Transportation (Non-Medical): Not on file  Physical Activity:   . Days of Exercise per Week: Not on file  . Minutes of Exercise per Session: Not on file  Stress:   . Feeling of Stress : Not on file  Social Connections:   . Frequency of Communication with Friends and Family: Not on file  . Frequency of Social Gatherings with Friends and Family: Not on file  . Attends Religious Services: Not on file  . Active Member of Clubs or Organizations: Not on  file  . Attends Archivist Meetings: Not on file  . Marital Status: Not on file  Intimate Partner Violence:   . Fear of Current or Ex-Partner: Not on file  . Emotionally Abused: Not on file  . Physically Abused: Not on file  . Sexually Abused: Not on file                Colonoscopy: 06/01/2014/[facility]             PAP:             Bone density:             Lipid panel:  228/114/50/155/ratio 4.6  On 10/01/2016  ALLERGIES: Allergies  Allergen Reactions  . Iohexol Swelling and Rash  . Compazine [Prochlorperazine Edisylate] Other (See Comments)    Mild confusion  . Contrast Media  [Iodinated Diagnostic Agents]   . Lactose Intolerance (Gi)     Diarrhea, gas bloating  . Cephalexin Rash    CURRENT MEDICATIONS: Current Outpatient Medications  Medication Sig Dispense Refill  . acetaminophen (TYLENOL 8 HOUR ARTHRITIS PAIN) 650 MG CR tablet Take 650 mg by mouth every 8 (eight) hours as needed for pain.    . furosemide (LASIX) 20 MG tablet TAKE 1 TABLET BY MOUTH EVERY DAY 90 tablet 1  . rivaroxaban (XARELTO) 20 MG TABS tablet TAKE 1 TABLET (20 MG TOTAL) BY MOUTH DAILY WITH SUPPER. 90 tablet 1  . rosuvastatin (CRESTOR) 10 MG tablet Take 10 mg by mouth at bedtime.  11  . SYNTHROID 88 MCG tablet Take 88 mcg by mouth daily before breakfast.     . valACYclovir (VALTREX) 1000 MG tablet  Take 1 tablet (1,000 mg total) by mouth daily. 90 tablet 6  . VENCLEXTA 100 MG TABS TAKE 2 TABS (200 MG) BY MOUTH DAILY. TAKE WITH FOOD AND WATER AT APPROXIMATELY THE SAME TIME EACH DAY. 60 tablet 2   No current facility-administered medications for this visit.   Facility-Administered Medications Ordered in Other Visits  Medication Dose Route Frequency Provider Last Rate Last Admin  . dextrose 5 % solution   Intravenous Continuous Maliki Gignac, [name] Dad, MD   Stopped at 08/11/20 1454    OBJECTIVE: white woman in no acute distress Vitals:   08/11/20 0916  BP: (!) 137/53  [name]: (!) 59  Resp: 18  Temp: (!) 97.1 F (36.2 C)  SpO2: 100%   Wt Readings from Last 3 Encounters:  08/11/20 196 lb 3.2 oz (89 kg)  04/20/20 192 lb 12.8 oz (87.5 kg)  03/21/20 189 lb 1.6 oz (85.8 kg)   Body mass index is 31.67 kg/m.    ECOG FS:1 - Symptomatic but completely ambulatory  Sclerae unicteric, EOMs intact Wearing a mask No cervical or supraclavicular adenopathy, no axillary adenopathy Lungs no rales or rhonchi Heart regular rate and rhythm Abd soft, nontender, positive bowel sounds MSK no focal spinal tenderness, no upper extremity lymphedema Neuro: nonfocal, well oriented, appropriate affect Breasts: Deferred   LAB RESULTS: Lab Results  Component Value Date   WBC 2.6 (L) 08/11/2020   NEUTROABS 1.6 (L) 08/11/2020   HGB 11.6 (L) 08/11/2020   HCT 37.2 08/11/2020   MCV 85.1 08/11/2020   PLT 141 (L) 08/11/2020   CMP Latest Ref Rng & Units 08/11/2020 04/20/2020 03/21/2020  Glucose 70 - 99 mg/dL 92 123(H) 96  BUN 8 - 23 mg/dL 16 13 15   Creatinine 0.44 - 1.00 mg/dL 0.95 0.83 0.83  Sodium 135 - 145 mmol/L 141  141 142  Potassium 3.5 - 5.1 mmol/L 3.8 4.0 4.2  Chloride 98 - 111 mmol/L 109 109 108  CO2 22 - 32 mmol/L 24 24 25   Calcium 8.9 - 10.3 mg/dL 9.4 9.8 9.8  Total Protein 6.5 - 8.1 g/dL 6.3(L) 6.0(L) 6.5  Total Bilirubin 0.3 - 1.2 mg/dL 0.4 0.5 0.5  Alkaline Phos 38 - 126 U/L 88 120 118    AST 15 - 41 U/L 24 20 24   ALT 0 - 44 U/L 18 13 24     No results for input(s): LABCA2 in the last 72 hours.  Lab Results  Component Value Date   JOINOMVE 720 (L) 04/20/2020   IGGSERUM 493 (L) 03/21/2020   IGGSERUM 235 (L) 01/05/2020   IGA 25 (L) 04/20/2020   IGA 26 (L) 03/21/2020   IGA 26 (L) 01/05/2020   IGMSERUM <5 (L) 04/20/2020   IGMSERUM <5 (L) 03/21/2020   IGMSERUM <5 (L) 01/05/2020     STUDIES: NM PET Image Restag (PS) Skull Base To Thigh  Result Date: 07/25/2020 CLINICAL DATA:  Subsequent treatment strategy for chronic lymphocytic leukemia. EXAM: NUCLEAR MEDICINE PET SKULL BASE TO THIGH TECHNIQUE: 9.5 mCi F-18 FDG was injected intravenously. Full-ring PET imaging was performed from the skull base to thigh after the radiotracer. CT data was obtained and used for attenuation correction and anatomic localization. Fasting blood glucose: 110 mg/dl COMPARISON:  08/31/2019. FINDINGS: Mediastinal blood pool activity: SUV max 2.7 Liver activity: SUV max 4.1 NECK: No abnormal hypermetabolism. Incidental CT findings: None. CHEST: No hypermetabolic mediastinal, hilar or axillary lymph nodes. No hypermetabolic pulmonary nodules. Incidental CT findings: Right IJ Port-A-Cath terminates at the SVC RA junction. Atherosclerotic calcification the aorta and coronary arteries. Heart is at the upper limits of normal in size. No pericardial or pleural effusion. Lungs are grossly clear. ABDOMEN/PELVIS: No abnormal hypermetabolism in the liver, adrenal glands, spleen or pancreas. There may be residual mildly hypermetabolic periportal lymph nodes measuring up to 6 mm (4/115) with an SUV max of 3.3. No additional hypermetabolic lymph nodes. Incidental CT findings: Low-attenuation lesions in the right hepatic lobe measure up to 2.3 cm and are similar to prior but difficult to definitively characterize without post-contrast imaging. Stones are seen in the gallbladder. Adrenal glands, kidneys, spleen, pancreas,  stomach and bowel are grossly unremarkable. Peripherally calcified omental nodule (4/131), unchanged. Stranding and nodularity in the small bowel mesentery has improved in the interval. SKELETON: No abnormal hypermetabolism. Incidental CT findings: Degenerative changes in the spine. IMPRESSION: 1. Small residual minimally hypermetabolic periportal lymph nodes. No additional hypermetabolic lymph nodes in the neck, chest, abdomen or pelvis. 2. Cholelithiasis. 3. Aortic atherosclerosis (ICD10-I70.0). Coronary artery calcification. Electronically Signed   By: Lorin Picket M.D.   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             (b) rituximab added to ibrutinib, first dose 08/27/2018             (c) hepatitis B studies 08/27/2018 negative             (f) ibrutinib/rituximab discontinued after 10/02/2018 dose w/o obvious response  (6) bendamustine/rituximab started 10/22/2018, discontinued after 1 cycle with rapid progression  (7) obinutuzumab/Gaziva started  11/11/2018  (a) second dose given 11/19/2018 together with chemotherapy  (b) third dose given 12/26/2018 and continuing every 3 weeks  (c) obinutuzumab discontinued after 09/09/2019 dose, with documentation of complete remission  (8) CH[O]P chemotherapy started 11/19/2018, completed 8 doses 05/19/2019             (a) echocardiogram on 11/07/2018 shows EF of 55-60%  (b) second cycle of CH[O]P postponed because of pandemic, given 12/26/2018, greatly reduced doses  (c) cycle 3 of CH[O]P/obinutuzumab given 02/03/2019 at increased doses  (d) cycle 4 and 5 of CH[O]P given on 6/16 and 7/7 at increased doses of 35mg /m2 and 550mg /m2  (e) Repeat echo on 04/22/2019 shows an EF of 60-65%  (9) severe immunocompromise: Marked hypogammaglobulinemia  (a) starting March 2020 she received IVIG every 8 weeks as needed  (b) starting September 2021 she receives IVIG every 3 months  (10) 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  (a) LMWH started 04/23/2019  (b) 75% dose reduction September 2020 secondary to bruising  (c) switched to rivaroxaban 09/10/2019  (d) 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   PLAN:   Luxe is now 18 years out from initial diagnosis of chronic lymphoid leukemia.  She has been on venetoclax for just over a year.  She continues to have an excellent response and very good tolerance.  Unfortunately she is not going to make a response to any vaccine.  She  certainly can continue to try as there is no harm in it and it might be helpful.  However I have asked her to behave as if she were unvaccinated since that is actually the case.  She does have some family members who refused to be vaccinated and she has to be particularly careful when around them.  She continues to receive IVIG every 12 weeks.  This has been very helpful in preventing infections particularly over the winter months.  She will receive a dose today and again 12 weeks from today.  I think she may be having slight edema which I do not think is going to be related to the venetoclax.  We are stopping the Lasix which I think is excessive in which she was taking as needed.  And stated she will try low-dose Dyazide and see if that works for her.  She is interested in stopping rivaroxaban.  Has been on anticoagulants for little over a year.  She feels and I tend to agree that her original DVTs were provoked because of inactivity secondary to her disease activity.  Given the negative Dopplers recently and the low D-dimer today, we are stopping rivaroxaban.  She knows to call for any problems that may develop before the next visit which will be in 12 weeks.  Total encounter time 35 minutes.Sarajane Jews C. Marranda Arakelian, MD 08/11/20 5:13 PM Medical Oncology and Hematology [facility] [place], [place] [address] Tel. [phone]    Fax. [phone]   I, Wilburn Mylar, am acting as scribe for Dr. [name] Dad. Yasmyn Bellisario.  I, Lurline Del MD, have reviewed the above documentation for accuracy and completeness, and I agree with the above.   *Total Encounter Time as defined by the Centers for Medicare and Medicaid Services includes, in addition to the face-to-face time of a patient visit (documented in the note above) non-face-to-face time: obtaining and reviewing outside history, ordering and reviewing medications, tests or procedures, care coordination  (communications with other health care professionals or caregivers) and documentation in the medical record.

## 2020-08-11 ENCOUNTER — Other Ambulatory Visit: Payer: Medicare PPO

## 2020-08-11 ENCOUNTER — Inpatient Hospital Stay: Payer: Medicare PPO | Admitting: Oncology

## 2020-08-11 ENCOUNTER — Other Ambulatory Visit: Payer: Self-pay

## 2020-08-11 ENCOUNTER — Inpatient Hospital Stay: Payer: Medicare PPO | Attending: Oncology

## 2020-08-11 ENCOUNTER — Inpatient Hospital Stay: Payer: Medicare PPO

## 2020-08-11 ENCOUNTER — Telehealth: Payer: Self-pay | Admitting: Oncology

## 2020-08-11 VITALS — BP 147/54 | HR 70 | Temp 98.1°F | Resp 18

## 2020-08-11 VITALS — BP 137/53 | HR 59 | Temp 97.1°F | Resp 18 | Ht 66.0 in | Wt 196.2 lb

## 2020-08-11 DIAGNOSIS — C9110 Chronic lymphocytic leukemia of B-cell type not having achieved remission: Secondary | ICD-10-CM | POA: Diagnosis not present

## 2020-08-11 DIAGNOSIS — D801 Nonfamilial hypogammaglobulinemia: Secondary | ICD-10-CM | POA: Diagnosis not present

## 2020-08-11 DIAGNOSIS — Z95828 Presence of other vascular implants and grafts: Secondary | ICD-10-CM

## 2020-08-11 LAB — CBC WITH DIFFERENTIAL/PLATELET
Abs Immature Granulocytes: 0 10*3/uL (ref 0.00–0.07)
Basophils Absolute: 0 10*3/uL (ref 0.0–0.1)
Basophils Relative: 0 %
Eosinophils Absolute: 0 10*3/uL (ref 0.0–0.5)
Eosinophils Relative: 1 %
HCT: 37.2 % (ref 36.0–46.0)
Hemoglobin: 11.6 g/dL — ABNORMAL LOW (ref 12.0–15.0)
Immature Granulocytes: 0 %
Lymphocytes Relative: 20 %
Lymphs Abs: 0.5 10*3/uL — ABNORMAL LOW (ref 0.7–4.0)
MCH: 26.5 pg (ref 26.0–34.0)
MCHC: 31.2 g/dL (ref 30.0–36.0)
MCV: 85.1 fL (ref 80.0–100.0)
Monocytes Absolute: 0.4 10*3/uL (ref 0.1–1.0)
Monocytes Relative: 17 %
Neutro Abs: 1.6 10*3/uL — ABNORMAL LOW (ref 1.7–7.7)
Neutrophils Relative %: 62 %
Platelets: 141 10*3/uL — ABNORMAL LOW (ref 150–400)
RBC: 4.37 MIL/uL (ref 3.87–5.11)
RDW: 14.3 % (ref 11.5–15.5)
WBC: 2.6 10*3/uL — ABNORMAL LOW (ref 4.0–10.5)
nRBC: 0 % (ref 0.0–0.2)

## 2020-08-11 LAB — COMPREHENSIVE METABOLIC PANEL
ALT: 18 U/L (ref 0–44)
AST: 24 U/L (ref 15–41)
Albumin: 3.9 g/dL (ref 3.5–5.0)
Alkaline Phosphatase: 88 U/L (ref 38–126)
Anion gap: 8 (ref 5–15)
BUN: 16 mg/dL (ref 8–23)
CO2: 24 mmol/L (ref 22–32)
Calcium: 9.4 mg/dL (ref 8.9–10.3)
Chloride: 109 mmol/L (ref 98–111)
Creatinine, Ser: 0.95 mg/dL (ref 0.44–1.00)
GFR, Estimated: >60 mL/min (ref >60)
Glucose, Bld: 92 mg/dL (ref 70–99)
Potassium: 3.8 mmol/L (ref 3.5–5.1)
Sodium: 141 mmol/L (ref 135–145)
Total Bilirubin: 0.4 mg/dL (ref 0.3–1.2)
Total Protein: 6.3 g/dL — ABNORMAL LOW (ref 6.5–8.1)

## 2020-08-11 LAB — D-DIMER, QUANTITATIVE: D-Dimer, Quant: 0.34 ug/mL-FEU (ref 0.00–0.50)

## 2020-08-11 LAB — LACTATE DEHYDROGENASE: LDH: 167 U/L (ref 98–192)

## 2020-08-11 MED ORDER — DIPHENHYDRAMINE HCL 25 MG PO TABS
25.0000 mg | ORAL_TABLET | Freq: Once | ORAL | Status: AC
  Administered 2020-08-11: 25 mg via ORAL
  Filled 2020-08-11: qty 1

## 2020-08-11 MED ORDER — DEXTROSE 5 % IV SOLN
INTRAVENOUS | Status: DC
  Filled 2020-08-11: qty 250

## 2020-08-11 MED ORDER — ACETAMINOPHEN 325 MG PO TABS
650.0000 mg | ORAL_TABLET | Freq: Once | ORAL | Status: AC
  Administered 2020-08-11: 650 mg via ORAL

## 2020-08-11 MED ORDER — IMMUNE GLOBULIN (HUMAN) 10 GM/100ML IV SOLN
1.0000 g/kg | Freq: Once | INTRAVENOUS | Status: AC
  Administered 2020-08-11: 90 g via INTRAVENOUS
  Filled 2020-08-11: qty 800

## 2020-08-11 MED ORDER — TRIAMTERENE-HCTZ 37.5-25 MG PO CAPS: 1.0000 | ORAL_CAPSULE | Freq: Every day | ORAL | 4 refills | Status: DC

## 2020-08-11 NOTE — Telephone Encounter (Signed)
Scheduled appts per 12/2 los. Pt stated she would refer to mychart for appt details.

## 2020-08-11 NOTE — Patient Instructions (Signed)
Immune Globulin Injection What is this medicine? IMMUNE GLOBULIN (im MUNE GLOB yoo lin) helps to prevent or reduce the severity of certain infections in patients who are at risk. This medicine is collected from the pooled blood of many donors. It is used to treat immune system problems, thrombocytopenia, and Kawasaki syndrome. This medicine may be used for other purposes; ask your health care provider or pharmacist if you have questions. COMMON BRAND NAME(S): ASCENIV, Baygam, BIVIGAM, Carimune, Carimune NF, cutaquig, Cuvitru, Flebogamma, Flebogamma DIF, GamaSTAN, GamaSTAN S/D, Gamimune N, Gammagard, Gammagard S/D, Gammaked, Gammaplex, Gammar-P IV, Gamunex, Gamunex-C, Hizentra, Iveegam, Iveegam EN, Octagam, Panglobulin, Panglobulin NF, panzyga, Polygam S/D, Privigen, Sandoglobulin, Venoglobulin-S, Vigam, Vivaglobulin, Xembify What should I tell my health care provider before I take this medicine? They need to know if you have any of these conditions:  diabetes  extremely low or no immune antibodies in the blood  heart disease  history of blood clots  hyperprolinemia  infection in the blood, sepsis  kidney disease  recently received or scheduled to receive a vaccination  an unusual or allergic reaction to human immune globulin, albumin, maltose, sucrose, other medicines, foods, dyes, or preservatives  pregnant or trying to get pregnant  breast-feeding How should I use this medicine? This medicine is for injection into a muscle or infusion into a vein or skin. It is usually given by a health care professional in a hospital or clinic setting. In rare cases, some brands of this medicine might be given at home. You will be taught how to give this medicine. Use exactly as directed. Take your medicine at regular intervals. Do not take your medicine more often than directed. Talk to your pediatrician regarding the use of this medicine in children. While this drug may be prescribed for selected  conditions, precautions do apply. Overdosage: If you think you have taken too much of this medicine contact a poison control center or emergency room at once. NOTE: This medicine is only for you. Do not share this medicine with others. What if I miss a dose? It is important not to miss your dose. Call your doctor or health care professional if you are unable to keep an appointment. If you give yourself the medicine and you miss a dose, take it as soon as you can. If it is almost time for your next dose, take only that dose. Do not take double or extra doses. What may interact with this medicine?  aspirin and aspirin-like medicines  cisplatin  cyclosporine  medicines for infection like acyclovir, adefovir, amphotericin B, bacitracin, cidofovir, foscarnet, ganciclovir, gentamicin, pentamidine, vancomycin  NSAIDS, medicines for pain and inflammation, like ibuprofen or naproxen  pamidronate  vaccines  zoledronic acid This list may not describe all possible interactions. Give your health care provider a list of all the medicines, herbs, non-prescription drugs, or dietary supplements you use. Also tell them if you smoke, drink alcohol, or use illegal drugs. Some items may interact with your medicine. What should I watch for while using this medicine? Your condition will be monitored carefully while you are receiving this medicine. This medicine is made from pooled blood donations of many different people. It may be possible to pass an infection in this medicine. However, the donors are screened for infections and all products are tested for HIV and hepatitis. The medicine is treated to kill most or all bacteria and viruses. Talk to your doctor about the risks and benefits of this medicine. Do not have vaccinations for at least   14 days before, or until at least 3 months after receiving this medicine. What side effects may I notice from receiving this medicine? Side effects that you should report  to your doctor or health care professional as soon as possible:  allergic reactions like skin rash, itching or hives, swelling of the face, lips, or tongue  blue colored lips or skin  breathing problems  chest pain or tightness  fever  signs and symptoms of aseptic meningitis such as stiff neck; sensitivity to light; headache; drowsiness; fever; nausea; vomiting; rash  signs and symptoms of a blood clot such as chest pain; shortness of breath; pain, swelling, or warmth in the leg  signs and symptoms of hemolytic anemia such as fast heartbeat; tiredness; dark yellow or brown urine; or yellowing of the eyes or skin  signs and symptoms of kidney injury like trouble passing urine or change in the amount of urine  sudden weight gain  swelling of the ankles, feet, hands Side effects that usually do not require medical attention (report to your doctor or health care professional if they continue or are bothersome):  diarrhea  flushing  headache  increased sweating  joint pain  muscle cramps  muscle pain  nausea  pain, redness, or irritation at site where injected  tiredness This list may not describe all possible side effects. Call your doctor for medical advice about side effects. You may report side effects to FDA at 1-800-FDA-1088. Where should I keep my medicine? Keep out of the reach of children. This drug is usually given in a hospital or clinic and will not be stored at home. In rare cases, some brands of this medicine may be given at home. If you are using this medicine at home, you will be instructed on how to store this medicine. Throw away any unused medicine after the expiration date on the label. NOTE: This sheet is a summary. It may not cover all possible information. If you have questions about this medicine, talk to your doctor, pharmacist, or health care provider.  2020 Elsevier/Gold Standard (2019-04-01 12:51:14)  

## 2020-08-11 NOTE — Progress Notes (Signed)
Pt waited full 30 minutes post IVIG treatment Pt discharged in no apparent distress. Pt left ambulatory without assistance.  Pt aware of discharge instructions and verbalized understanding and had no further questions.

## 2020-08-12 LAB — IGG, IGA, IGM
IgA: 23 mg/dL — ABNORMAL LOW (ref 87–352)
IgG (Immunoglobin G), Serum: 364 mg/dL — ABNORMAL LOW (ref 586–1602)
IgM (Immunoglobulin M), Srm: <5 mg/dL — ABNORMAL LOW (ref 26–217)

## 2020-08-29 MED FILL — VENCLEXTA 100 MG TABS: 100 | 30 days supply | Qty: 60 | Fill #2

## 2020-09-20 ENCOUNTER — Other Ambulatory Visit: Payer: Self-pay | Admitting: Oncology

## 2020-09-20 DIAGNOSIS — C9110 Chronic lymphocytic leukemia of B-cell type not having achieved remission: Secondary | ICD-10-CM

## 2020-09-23 ENCOUNTER — Other Ambulatory Visit: Payer: Self-pay | Admitting: Oncology

## 2020-09-26 ENCOUNTER — Other Ambulatory Visit: Payer: Self-pay | Admitting: Adult Health

## 2020-09-26 DIAGNOSIS — C9110 Chronic lymphocytic leukemia of B-cell type not having achieved remission: Secondary | ICD-10-CM

## 2020-09-26 NOTE — Progress Notes (Signed)
Referral placed for evusheld per dr. Jana Hakim

## 2020-09-27 MED FILL — VENCLEXTA 100 MG TABS: 100 | 30 days supply | Qty: 60 | Fill #0

## 2020-10-15 ENCOUNTER — Other Ambulatory Visit: Payer: Self-pay | Admitting: Adult Health

## 2020-10-15 DIAGNOSIS — C9110 Chronic lymphocytic leukemia of B-cell type not having achieved remission: Secondary | ICD-10-CM

## 2020-10-15 NOTE — Progress Notes (Signed)
I connected by phone with Maria [name] on 10/15/2020, 12:10 PM to discuss the potential use of a new treatment for mild to moderate COVID-19 viral infection in non-hospitalized patients.  This patient is a 70 y.o. female that meets the FDA criteria for Emergency Use Authorization of tixagevimab/cilgavimab for pre-exposure prophylaxis of COVID-19 disease. Pt meets following criteria:  Age >12 yr and weight > 40kg  Not currently infected with SARS-CoV-2 and has no known recent exposure to an individual infected with SARS-CoV-2 AND o Who has moderate to severe immune compromise due to a medical condition or receipt of immunosuppressive medications or treatments and may not mount an adequate immune response to COVID-19 vaccination or  o Vaccination with any available COVID-19 vaccine, according to the approved or authorized schedule, is not recommended due to a history of severe adverse reaction (e.g., severe allergic reaction) to a COVID-19 vaccine(s) and/or COVID-19 vaccine component(s).  o Patient meets the following definition of mod-severe immune compromised status: 6. Other actively treated hematologic malignancies or severe congenital immunodeficiency syndromes  I have spoken and communicated the following to the patient or parent/caregiver regarding COVID monoclonal antibody treatment:  1. FDA has authorized the emergency use of tixagevimab/cilgavimab for the pre-exposure prophylaxis of COVID-19 in patients with moderate-severe immunocompromised status, who meet above EUA criteria.  2. The significant known and potential risks and benefits of COVID monoclonal antibody, and the extent to which such potential risks and benefits are unknown.  3. Information on available alternative treatments and the risks and benefits of those alternatives, including clinical trials.  4. The patient or parent/caregiver has the option to accept or refuse COVID monoclonal antibody treatment.  After  reviewing this information with the patient, agree to receive tixagevimab/cilgavimab.  Will add to her 11/09/2020 appointment.    Scot Dock, NP, 10/15/2020, 12:10 PM

## 2020-10-24 MED FILL — VENCLEXTA 100 MG TABS: 100 | 30 days supply | Qty: 60 | Fill #1

## 2020-11-08 ENCOUNTER — Other Ambulatory Visit (HOSPITAL_COMMUNITY): Payer: Self-pay | Admitting: Adult Health

## 2020-11-08 NOTE — Progress Notes (Signed)
I reviewed with Almon Register the updated dosing recommendations for Evusheld to 300mg Tixagevimab/300mg  Cilgavimab.  Patient verbalized understanding and agreed to updated dose.  Due to system updates, she will receive this March 21 or later.  I sent a scheduling message for her to receive this dose, during that time frame and discontinued her current 150mg  dosing orders.    Wilber Bihari, NP

## 2020-11-08 NOTE — Progress Notes (Signed)
[place]  [phone] [phone]    ID: IJANAE MACAPAGAL   DOB: [birth year]  MR#: [id number]  FYB#:[id number]  Patient Care Team: Marton Redwood, MD as PCP - General (Internal Medicine) [name], MD as Consulting Physician (Orthopedic Surgery) Aubreana Cornacchia, [name] Dad, MD as Consulting Physician (Oncology) [name], MD as Consulting Physician (General Surgery) Causey, [name], NP as Nurse Practitioner (Hematology and Oncology) OTHER MD:  CHIEF COMPLAINT: Small lymphocytic lymphoma/chronic lymphocytic leukemia  CURRENT TREATMENT: venetoclax, IVIG, Feraheme   INTERVAL HISTORY: Teddi returns today for follow-up and treatment of her small lymphocytic lymphoma/chronic lymphocytic leukemia.   She continues on venetoclax 200mg  daily.  She thinks this may be causing her some joint stiffness and she feels kind of stiff all over most of the time she says.  She occasionally takes Tylenol for this.  She stopped rivaroxaban approximately 1 month ago.  She has had no overt bleeding or bruising.  She receives IVIG prophylactically every 12 weeks.  She has a dose due today.   Lab Results  Component Value Date   IGGSERUM 364 (L) 08/11/2020   IGGSERUM 345 (L) 04/20/2020   IGGSERUM 493 (L) 03/21/2020   IGA 23 (L) 08/11/2020   IGA 25 (L) 04/20/2020   IGA 26 (L) 03/21/2020   IGMSERUM <5 (L) 08/11/2020   IGMSERUM <5 (L) 04/20/2020   IGMSERUM <5 (L) 03/21/2020      REVIEW OF SYSTEMS: Nishi generally feels well.  She is going to the senior center [facility] to [facility].  Unfortunately several members there have not been vaccinated.  She tells me her diet went out of hand early in the year and she has gained some weight.  She is working on that.  Detailed review of systems today was otherwise noncontributory   COVID 19 VACCINATION STATUS: Status post Pfizer vaccine x2+ booster August 2021, with subsequent antibody testing negative (no  response to vaccines); receives 300mg Tixagevimab/300mg  Cilgavimab on 11/29/2020.   HISTORY OF PRESENT ILLNESS: From the original intake note:  [name] developed what she thought was "the flu" in December of 2002. She noted a large lymph node developing in her right anterior cervical area. 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   Her subsequent history is as detailed above.   PAST MEDICAL HISTORY: Past Medical History:  Diagnosis Date  . Allergy   . Arthritis   . Asthma   . Cancer [facility])    chronic lymphacytic leukemia  . CLL (chronic lymphocytic leukemia) ([place])   . Diabetes mellitus    PMH  . Headache    migraine  . Hyperlipidemia   . Hypertension   . Hypothyroidism   . Lymphoma ([place])   . Thyroid disease    hypothyroidism  . Wears glasses     PAST SURGICAL HISTORY: Past Surgical History:  Procedure Laterality Date  . ABDOMINAL HYSTERECTOMY    . AXILLARY LYMPH NODE BIOPSY Right 08/21/2018   Procedure: RIGHT AXILLARY LYMPH NODE BIOPSY ERAS PATHWAY;  Surgeon: [name], MD;  Location: [facility];  Service: General;  Laterality: Right;  LMA  . BREAST EXCISIONAL BIOPSY Right   . COLONOSCOPY    . IR IMAGING GUIDED PORT INSERTION  11/10/2018  . LYMPH NODE DISSECTION    . STRABISMUS SURGERY    . TONSILLECTOMY    . TOTAL KNEE ARTHROPLASTY Right 12/03/2016   Procedure: RIGHT TOTAL KNEE ARTHROPLASTY;  Surgeon: [name], MD;  Location: [facility];  Service: Orthopedics;  Laterality: Right;    FAMILY HISTORY: Family History  Problem Relation Age of Onset  . Liver cancer Mother   . Heart disease Father   . Breast cancer Sister 40  . Colon cancer Neg Hx   . Pancreatic cancer Neg Hx   . Rectal cancer Neg Hx   . Stomach cancer Neg Hx   The patient's father died at the age of 71 from a myocardial infarction. The patient's mother died at the age of 55 from primary liver carcinoma. The patient had  no brothers. Her one sister, [name], has a history of breast cancer   GYNECOLOGIC HISTORY: Menarche at around age 24. The patient is GX P0. 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 She lives by herself. She had two cats, [name] and [place], but they are currently with her sister and Maridel thinks they would be more at home there. She attends a [employer], operational.               ADVANCED DIRECTIVES: Completed 02/03/2019. She has named her niece as her 70.   HEALTH MAINTENANCE: Social History   Socioeconomic History  . Marital status: Single    Spouse name: Not on file  . Number of children: Not on file  . Years of education: Not on file  . Highest education level: Not on file  Occupational History  . Not on file  Tobacco Use  . Smoking status: Never Smoker  . Smokeless tobacco: Never Used  Vaping Use  . Vaping Use: Never used  Substance and Sexual Activity  . Alcohol use: Yes    Comment: occasional alcohol intake; once or twice monthly  . Drug use: No  . Sexual activity: Not on file  Other Topics Concern  . Not on file  Social History Narrative  . Not on file   Social Determinants of Health   Financial Resource Strain: Not on file  Food Insecurity: Not on file  Transportation Needs: Not on file  Physical Activity: Not on file  Stress: Not on file  Social Connections: Not on file  Intimate Partner Violence: Not on file                Colonoscopy: 06/01/2014/[facility]             PAP:             Bone density:             Lipid panel:  228/114/50/155/ratio 4.6  On 10/01/2016  ALLERGIES: Allergies  Allergen Reactions  . Iohexol Swelling and Rash  . Compazine [Prochlorperazine Edisylate] Other (See Comments)    Mild confusion  . Contrast Media  [Iodinated Diagnostic Agents]   . Lactose  Intolerance (Gi)     Diarrhea, gas bloating  . Cephalexin Rash    CURRENT MEDICATIONS: Current Outpatient Medications  Medication Sig Dispense Refill  . acetaminophen (TYLENOL 8 HOUR ARTHRITIS PAIN) 650 MG CR tablet Take 650 mg by mouth every 8 (eight) hours as needed for pain.    . rosuvastatin (CRESTOR) 10 MG tablet Take 10 mg by mouth at bedtime.  11  . SYNTHROID 88 MCG tablet Take 88 mcg by mouth daily before breakfast.     . triamterene-hydrochlorothiazide (DYAZIDE) 37.5-25 MG capsule Take 1 each (1 capsule total)  by mouth daily. 90 capsule 4  . valACYclovir (VALTREX) 1000 MG tablet Take 1 tablet (1,000 mg total) by mouth daily. 90 tablet 6  . VENCLEXTA 100 MG tablet TAKE 2 TABS (200 MG) BY MOUTH DAILY. TAKE WITH FOOD AND WATER AT APPROXIMATELY THE SAME TIME EACH DAY. 60 tablet 2   No current facility-administered medications for this visit.    OBJECTIVE: white woman in no acute distress There were no vitals filed for this visit. Wt Readings from Last 3 Encounters:  08/11/20 196 lb 3.2 oz (89 kg)  04/20/20 192 lb 12.8 oz (87.5 kg)  03/21/20 189 lb 1.6 oz (85.8 kg)   There is no height or weight on file to calculate BMI.    ECOG FS:1 - Symptomatic but completely ambulatory  Sclerae unicteric, EOMs intact Wearing a mask No cervical or supraclavicular adenopathy, no axillary adenopathy Lungs no rales or rhonchi Heart regular rate and rhythm Abd soft, nontender, positive bowel sounds MSK no focal spinal tenderness, no upper extremity lymphedema Neuro: nonfocal, well oriented, appropriate affect Breasts: Deferred    LAB RESULTS: Lab Results  Component Value Date   WBC 3.1 (L) 11/09/2020   NEUTROABS 1.7 11/09/2020   HGB 11.5 (L) 11/09/2020   HCT 37.0 11/09/2020   MCV 78.7 (L) 11/09/2020   PLT 146 (L) 11/09/2020   CMP Latest Ref Rng & Units 08/11/2020 04/20/2020 03/21/2020  Glucose 70 - 99 mg/dL 92 123(H) 96  BUN 8 - 23 mg/dL 16 13 15   Creatinine 0.44 - 1.00 mg/dL  0.95 0.83 0.83  Sodium 135 - 145 mmol/L 141 141 142  Potassium 3.5 - 5.1 mmol/L 3.8 4.0 4.2  Chloride 98 - 111 mmol/L 109 109 108  CO2 22 - 32 mmol/L 24 24 25   Calcium 8.9 - 10.3 mg/dL 9.4 9.8 9.8  Total Protein 6.5 - 8.1 g/dL 6.3(L) 6.0(L) 6.5  Total Bilirubin 0.3 - 1.2 mg/dL 0.4 0.5 0.5  Alkaline Phos 38 - 126 U/L 88 120 118  AST 15 - 41 U/L 24 20 24   ALT 0 - 44 U/L 18 13 24     No results for input(s): LABCA2 in the last 72 hours.  Lab Results  Component Value Date   IGGSERUM 364 (L) 08/11/2020   IGGSERUM 345 (L) 04/20/2020   IGGSERUM 493 (L) 03/21/2020   IGA 23 (L) 08/11/2020   IGA 25 (L) 04/20/2020   IGA 26 (L) 03/21/2020   IGMSERUM <5 (L) 08/11/2020   IGMSERUM <5 (L) 04/20/2020   IGMSERUM <5 (L) 03/21/2020     STUDIES: No results found.   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             (b) rituximab added to ibrutinib, first dose 08/27/2018             (c) hepatitis B studies 08/27/2018 negative             (f) ibrutinib/rituximab discontinued after  10/02/2018 dose w/o obvious response  (6) bendamustine/rituximab started 10/22/2018, discontinued after 1 cycle with rapid progression  (7) obinutuzumab/Gaziva started 11/11/2018  (a) second dose given 11/19/2018 together with chemotherapy  (b) third dose given 12/26/2018 and continuing every 3 weeks  (c) obinutuzumab discontinued after 09/09/2019 dose, with documentation of complete remission  (8) CH[O]P chemotherapy started 11/19/2018, completed 8 doses 05/19/2019             (a) echocardiogram on 11/07/2018 shows EF of 55-60%  (b) second cycle of CH[O]P postponed because of pandemic, given 12/26/2018, greatly reduced doses  (c) cycle 3 of CH[O]P/obinutuzumab given 02/03/2019 at increased doses  (d) cycle 4 and 5 of CH[O]P given on 6/16 and 7/7 at increased doses of 35mg /m2 and 550mg /m2  (e) Repeat echo on 04/22/2019 shows an EF of 60-65%  (9) severe immunocompromise: Marked hypogammaglobulinemia  (a) starting March 2020 she received IVIG every 8 weeks as needed  (b) starting September 2021 she receives IVIG every 3 months  (10) 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  (a) LMWH started 04/23/2019  (b) 75% dose reduction September 2020 secondary to bruising  (c) switched to rivaroxaban 09/10/2019  (d) 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   PLAN:   Keyairra is now a little over 18 years out from her initial diagnosis of chronic lymphoid leukemia.  She is  currently in remission with no symptoms related to her disease.  She is tolerating her treatment well and the plan is to continue venetoclax indefinitely.  She will receive the FEU shelled antibodies on 11/29/2020.  She is contemplating a family trip including some family members who have not been vaccinated but had been infected with COVID-19.  My suggestion is that she have the family members tested prior to her trip.  Her MCV is down.  She likely is mildly iron deficient.  I have asked her to try and take some iron by mouth.  I am adding Feraheme to her 11/30/2018 22 infusion.  She is behind on mammography and I have entered the appropriate orders.  Otherwise we are continuing the IVIG every 3 months and she will see me 3 months from now  She knows to call for any other issue that may develop before the next visit.  Total encounter time 35 minutes.Sarajane Jews C. Lamorris Knoblock, MD 11/09/20 9:15 AM Medical Oncology and Hematology [facility] [place], [place] [address] Tel. [phone]    Fax. [phone]   I, Wilburn Mylar, am acting as scribe for Dr. [name] Dad. Beaumont Austad.  Lindie Spruce MD, have reviewed  the above documentation for accuracy and completeness, and I agree with the above.   *Total Encounter Time as defined by the Centers for Medicare and Medicaid Services includes, in addition to the face-to-face time of a patient visit (documented in the note above) non-face-to-face time: obtaining and reviewing outside history, ordering and reviewing medications, tests or procedures, care coordination (communications with other health care professionals or caregivers) and documentation in the medical record.

## 2020-11-09 ENCOUNTER — Inpatient Hospital Stay: Payer: Medicare PPO | Admitting: Oncology

## 2020-11-09 ENCOUNTER — Inpatient Hospital Stay: Payer: Medicare PPO

## 2020-11-09 ENCOUNTER — Other Ambulatory Visit: Payer: Self-pay

## 2020-11-09 ENCOUNTER — Inpatient Hospital Stay: Payer: Medicare PPO | Attending: Oncology

## 2020-11-09 VITALS — BP 139/51 | HR 71 | Temp 97.2°F | Resp 18 | Ht 66.0 in | Wt 197.3 lb

## 2020-11-09 VITALS — BP 134/59 | HR 65 | Temp 98.0°F | Resp 18

## 2020-11-09 DIAGNOSIS — C9110 Chronic lymphocytic leukemia of B-cell type not having achieved remission: Secondary | ICD-10-CM

## 2020-11-09 DIAGNOSIS — Z79899 Other long term (current) drug therapy: Secondary | ICD-10-CM | POA: Insufficient documentation

## 2020-11-09 DIAGNOSIS — D801 Nonfamilial hypogammaglobulinemia: Secondary | ICD-10-CM | POA: Insufficient documentation

## 2020-11-09 DIAGNOSIS — E611 Iron deficiency: Secondary | ICD-10-CM | POA: Diagnosis not present

## 2020-11-09 DIAGNOSIS — Z95828 Presence of other vascular implants and grafts: Secondary | ICD-10-CM

## 2020-11-09 DIAGNOSIS — Z298 Encounter for other specified prophylactic measures: Secondary | ICD-10-CM | POA: Insufficient documentation

## 2020-11-09 LAB — COMPREHENSIVE METABOLIC PANEL
ALT: 21 U/L (ref 0–44)
AST: 22 U/L (ref 15–41)
Albumin: 3.9 g/dL (ref 3.5–5.0)
Alkaline Phosphatase: 91 U/L (ref 38–126)
Anion gap: 11 (ref 5–15)
BUN: 15 mg/dL (ref 8–23)
CO2: 25 mmol/L (ref 22–32)
Calcium: 9.3 mg/dL (ref 8.9–10.3)
Chloride: 104 mmol/L (ref 98–111)
Creatinine, Ser: 1.07 mg/dL — ABNORMAL HIGH (ref 0.44–1.00)
GFR, Estimated: 56 mL/min — ABNORMAL LOW (ref >60)
Glucose, Bld: 195 mg/dL — ABNORMAL HIGH (ref 70–99)
Potassium: 3.5 mmol/L (ref 3.5–5.1)
Sodium: 140 mmol/L (ref 135–145)
Total Bilirubin: 0.5 mg/dL (ref 0.3–1.2)
Total Protein: 6.4 g/dL — ABNORMAL LOW (ref 6.5–8.1)

## 2020-11-09 LAB — CBC WITH DIFFERENTIAL/PLATELET
Abs Immature Granulocytes: 0.01 10*3/uL (ref 0.00–0.07)
Basophils Absolute: 0 10*3/uL (ref 0.0–0.1)
Basophils Relative: 1 %
Eosinophils Absolute: 0 10*3/uL (ref 0.0–0.5)
Eosinophils Relative: 1 %
HCT: 37 % (ref 36.0–46.0)
Hemoglobin: 11.5 g/dL — ABNORMAL LOW (ref 12.0–15.0)
Immature Granulocytes: 0 %
Lymphocytes Relative: 28 %
Lymphs Abs: 0.9 10*3/uL (ref 0.7–4.0)
MCH: 24.5 pg — ABNORMAL LOW (ref 26.0–34.0)
MCHC: 31.1 g/dL (ref 30.0–36.0)
MCV: 78.7 fL — ABNORMAL LOW (ref 80.0–100.0)
Monocytes Absolute: 0.5 10*3/uL (ref 0.1–1.0)
Monocytes Relative: 16 %
Neutro Abs: 1.7 10*3/uL (ref 1.7–7.7)
Neutrophils Relative %: 54 %
Platelets: 146 10*3/uL — ABNORMAL LOW (ref 150–400)
RBC: 4.7 MIL/uL (ref 3.87–5.11)
RDW: 15.5 % (ref 11.5–15.5)
WBC: 3.1 10*3/uL — ABNORMAL LOW (ref 4.0–10.5)
nRBC: 0 % (ref 0.0–0.2)

## 2020-11-09 LAB — LACTATE DEHYDROGENASE: LDH: 226 U/L — ABNORMAL HIGH (ref 98–192)

## 2020-11-09 MED ORDER — SODIUM CHLORIDE 0.9% FLUSH
10.0000 mL | Freq: Once | INTRAVENOUS | Status: AC
  Administered 2020-11-09: 10 mL
  Filled 2020-11-09: qty 10

## 2020-11-09 MED ORDER — ACETAMINOPHEN 325 MG PO TABS
650.0000 mg | ORAL_TABLET | Freq: Once | ORAL | Status: AC
  Administered 2020-11-09: 650 mg via ORAL

## 2020-11-09 MED ORDER — DIPHENHYDRAMINE HCL 25 MG PO CAPS
ORAL_CAPSULE | ORAL | Status: AC
  Filled 2020-11-09: qty 1

## 2020-11-09 MED ORDER — IMMUNE GLOBULIN (HUMAN) 10 GM/100ML IV SOLN
1.0000 g/kg | Freq: Once | INTRAVENOUS | Status: AC
  Administered 2020-11-09: 90 g via INTRAVENOUS
  Filled 2020-11-09: qty 800

## 2020-11-09 MED ORDER — DIPHENHYDRAMINE HCL 25 MG PO TABS
25.0000 mg | ORAL_TABLET | Freq: Once | ORAL | Status: AC
  Administered 2020-11-09: 25 mg via ORAL
  Filled 2020-11-09: qty 1

## 2020-11-09 MED ORDER — ACETAMINOPHEN 325 MG PO TABS
ORAL_TABLET | ORAL | Status: AC
  Filled 2020-11-09: qty 2

## 2020-11-09 MED ORDER — DEXTROSE 5 % IV SOLN
Freq: Once | INTRAVENOUS | Status: AC
  Filled 2020-11-09: qty 250

## 2020-11-09 MED ORDER — HEPARIN SOD (PORK) LOCK FLUSH 100 UNIT/ML IV SOLN
500.0000 [IU] | Freq: Once | INTRAVENOUS | Status: AC
  Administered 2020-11-09: 500 [IU]
  Filled 2020-11-09: qty 5

## 2020-11-09 NOTE — Patient Instructions (Signed)
Immune Globulin Injection What is this medicine? IMMUNE GLOBULIN (im MUNE GLOB yoo lin) helps to prevent or reduce the severity of certain infections in patients who are at risk. This medicine is collected from the pooled blood of many donors. It is used to treat immune system problems, thrombocytopenia, and Kawasaki syndrome. This medicine may be used for other purposes; ask your health care provider or pharmacist if you have questions. COMMON BRAND NAME(S): ASCENIV, Baygam, BIVIGAM, Carimune, Carimune NF, cutaquig, Cuvitru, Flebogamma, Flebogamma DIF, GamaSTAN, GamaSTAN S/D, Gamimune N, Gammagard, Gammagard S/D, Gammaked, Gammaplex, Gammar-P IV, Gamunex, Gamunex-C, Hizentra, Iveegam, Iveegam EN, Octagam, Panglobulin, Panglobulin NF, panzyga, Polygam S/D, Privigen, Sandoglobulin, Venoglobulin-S, Vigam, Vivaglobulin, Xembify What should I tell my health care provider before I take this medicine? They need to know if you have any of these conditions:  diabetes  extremely low or no immune antibodies in the blood  heart disease  history of blood clots  hyperprolinemia  infection in the blood, sepsis  kidney disease  recently received or scheduled to receive a vaccination  an unusual or allergic reaction to human immune globulin, albumin, maltose, sucrose, other medicines, foods, dyes, or preservatives  pregnant or trying to get pregnant  breast-feeding How should I use this medicine? This medicine is for injection into a muscle or infusion into a vein or skin. It is usually given by a health care professional in a hospital or clinic setting. In rare cases, some brands of this medicine might be given at home. You will be taught how to give this medicine. Use exactly as directed. Take your medicine at regular intervals. Do not take your medicine more often than directed. Talk to your pediatrician regarding the use of this medicine in children. While this drug may be prescribed for selected  conditions, precautions do apply. Overdosage: If you think you have taken too much of this medicine contact a poison control center or emergency room at once. NOTE: This medicine is only for you. Do not share this medicine with others. What if I miss a dose? It is important not to miss your dose. Call your doctor or health care professional if you are unable to keep an appointment. If you give yourself the medicine and you miss a dose, take it as soon as you can. If it is almost time for your next dose, take only that dose. Do not take double or extra doses. What may interact with this medicine?  aspirin and aspirin-like medicines  cisplatin  cyclosporine  medicines for infection like acyclovir, adefovir, amphotericin B, bacitracin, cidofovir, foscarnet, ganciclovir, gentamicin, pentamidine, vancomycin  NSAIDS, medicines for pain and inflammation, like ibuprofen or naproxen  pamidronate  vaccines  zoledronic acid This list may not describe all possible interactions. Give your health care provider a list of all the medicines, herbs, non-prescription drugs, or dietary supplements you use. Also tell them if you smoke, drink alcohol, or use illegal drugs. Some items may interact with your medicine. What should I watch for while using this medicine? Your condition will be monitored carefully while you are receiving this medicine. This medicine is made from pooled blood donations of many different people. It may be possible to pass an infection in this medicine. However, the donors are screened for infections and all products are tested for HIV and hepatitis. The medicine is treated to kill most or all bacteria and viruses. Talk to your doctor about the risks and benefits of this medicine. Do not have vaccinations for at least   14 days before, or until at least 3 months after receiving this medicine. What side effects may I notice from receiving this medicine? Side effects that you should report  to your doctor or health care professional as soon as possible:  allergic reactions like skin rash, itching or hives, swelling of the face, lips, or tongue  blue colored lips or skin  breathing problems  chest pain or tightness  fever  signs and symptoms of aseptic meningitis such as stiff neck; sensitivity to light; headache; drowsiness; fever; nausea; vomiting; rash  signs and symptoms of a blood clot such as chest pain; shortness of breath; pain, swelling, or warmth in the leg  signs and symptoms of hemolytic anemia such as fast heartbeat; tiredness; dark yellow or brown urine; or yellowing of the eyes or skin  signs and symptoms of kidney injury like trouble passing urine or change in the amount of urine  sudden weight gain  swelling of the ankles, feet, hands Side effects that usually do not require medical attention (report to your doctor or health care professional if they continue or are bothersome):  diarrhea  flushing  headache  increased sweating  joint pain  muscle cramps  muscle pain  nausea  pain, redness, or irritation at site where injected  tiredness This list may not describe all possible side effects. Call your doctor for medical advice about side effects. You may report side effects to FDA at 1-800-FDA-1088. Where should I keep my medicine? Keep out of the reach of children. This drug is usually given in a hospital or clinic and will not be stored at home. In rare cases, some brands of this medicine may be given at home. If you are using this medicine at home, you will be instructed on how to store this medicine. Throw away any unused medicine after the expiration date on the label. NOTE: This sheet is a summary. It may not cover all possible information. If you have questions about this medicine, talk to your doctor, pharmacist, or health care provider.  2020 Elsevier/Gold Standard (2019-04-01 12:51:14)  

## 2020-11-09 NOTE — Patient Instructions (Signed)

## 2020-11-10 LAB — IGG, IGA, IGM
IgA: 27 mg/dL — ABNORMAL LOW (ref 87–352)
IgG (Immunoglobin G), Serum: 454 mg/dL — ABNORMAL LOW (ref 586–1602)
IgM (Immunoglobulin M), Srm: <5 mg/dL — ABNORMAL LOW (ref 26–217)

## 2020-11-17 ENCOUNTER — Other Ambulatory Visit: Payer: Self-pay

## 2020-11-17 ENCOUNTER — Ambulatory Visit
Admission: RE | Admit: 2020-11-17 | Discharge: 2020-11-17 | Disposition: A | Discharge status: Home / Self Care | Payer: Medicare PPO | Source: Ambulatory Visit | Attending: Oncology | Admitting: Oncology

## 2020-11-17 DIAGNOSIS — Z1231 Encounter for screening mammogram for malignant neoplasm of breast: Secondary | ICD-10-CM | POA: Diagnosis not present

## 2020-11-17 DIAGNOSIS — C9110 Chronic lymphocytic leukemia of B-cell type not having achieved remission: Secondary | ICD-10-CM

## 2020-11-21 ENCOUNTER — Encounter: Payer: Self-pay | Admitting: Adult Health

## 2020-11-21 ENCOUNTER — Telehealth: Payer: Self-pay | Admitting: Adult Health

## 2020-11-21 NOTE — Telephone Encounter (Signed)
Received inquiry about Evusheld from patient's oncology nurse.  See my chart message.  Maria Bihari, NP

## 2020-11-27 ENCOUNTER — Other Ambulatory Visit: Payer: Self-pay | Admitting: Physician Assistant

## 2020-11-27 DIAGNOSIS — C9110 Chronic lymphocytic leukemia of B-cell type not having achieved remission: Secondary | ICD-10-CM

## 2020-11-27 NOTE — Progress Notes (Signed)
I connected by phone with Maria [name] on 11/27/2020, 12:48 PM to discuss the potential use of a new treatment, tixagevimab/cilgavimab, for pre-exposure prophylaxis for prevention of coronavirus disease 2019 (COVID-19) caused by the SARS-CoV-2 virus.  This patient is a 70 y.o. female that meets the FDA criteria for Emergency Use Authorization of tixagevimab/cilgavimab for pre-exposure prophylaxis of COVID-19 disease. Pt meets following criteria:  Age >12 yr and weight > 40kg  Not currently infected with SARS-CoV-2 and has no known recent exposure to an individual infected with SARS-CoV-2 AND o Who has moderate to severe immune compromise due to a medical condition or receipt of immunosuppressive medications or treatments and may not mount an adequate immune response to COVID-19 vaccination or  o Vaccination with any available COVID-19 vaccine, according to the approved or authorized schedule, is not recommended due to a history of severe adverse reaction (e.g., severe allergic reaction) to a COVID-19 vaccine(s) and/or COVID-19 vaccine component(s).  o Patient meets the following definition of mod-severe immune compromised status: 6. Other actively treated hematologic malignancies or severe congenital immunodeficiency syndromes  I have spoken and communicated the following to the patient or parent/caregiver regarding COVID monoclonal antibody treatment:  1. FDA has authorized the emergency use of tixagevimab/cilgavimab for the pre-exposure prophylaxis of COVID-19 in patients with moderate-severe immunocompromised status, who meet above EUA criteria.  2. The significant known and potential risks and benefits of COVID monoclonal antibody, and the extent to which such potential risks and benefits are unknown.  3. Information on available alternative treatments and the risks and benefits of those alternatives, including clinical trials.  4. The patient or parent/caregiver has the option to accept  or refuse COVID monoclonal antibody treatment.  After reviewing this information with the patient, agree to receive tixagevimab/cilgavimab  Maria Form, PA-C, 11/27/2020, 12:48 PM

## 2020-11-29 ENCOUNTER — Other Ambulatory Visit: Payer: Self-pay

## 2020-11-29 ENCOUNTER — Inpatient Hospital Stay: Payer: Medicare PPO

## 2020-11-29 DIAGNOSIS — Z79899 Other long term (current) drug therapy: Secondary | ICD-10-CM | POA: Diagnosis not present

## 2020-11-29 DIAGNOSIS — C9110 Chronic lymphocytic leukemia of B-cell type not having achieved remission: Secondary | ICD-10-CM

## 2020-11-29 DIAGNOSIS — E611 Iron deficiency: Secondary | ICD-10-CM | POA: Diagnosis not present

## 2020-11-29 DIAGNOSIS — D801 Nonfamilial hypogammaglobulinemia: Secondary | ICD-10-CM | POA: Diagnosis not present

## 2020-11-29 DIAGNOSIS — Z298 Encounter for other specified prophylactic measures: Secondary | ICD-10-CM | POA: Diagnosis not present

## 2020-11-29 MED ORDER — CILGAVIMAB (PART OF EVUSHELD) INJECTION
300.0000 mg | Freq: Once | INTRAMUSCULAR | Status: AC
  Administered 2020-11-29: 300 mg via INTRAMUSCULAR
  Filled 2020-11-29: qty 3

## 2020-11-29 MED ORDER — TIXAGEVIMAB (PART OF EVUSHELD) INJECTION
300.0000 mg | Freq: Once | INTRAMUSCULAR | Status: AC
  Administered 2020-11-29: 300 mg via INTRAMUSCULAR
  Filled 2020-11-29: qty 3

## 2020-12-08 ENCOUNTER — Other Ambulatory Visit (HOSPITAL_COMMUNITY): Payer: Self-pay

## 2020-12-21 ENCOUNTER — Other Ambulatory Visit: Payer: Self-pay | Admitting: Oncology

## 2020-12-21 ENCOUNTER — Other Ambulatory Visit (HOSPITAL_COMMUNITY): Payer: Self-pay

## 2020-12-21 DIAGNOSIS — C9110 Chronic lymphocytic leukemia of B-cell type not having achieved remission: Secondary | ICD-10-CM

## 2020-12-21 MED ORDER — VENETOCLAX 100 MG PO TABS
ORAL_TABLET | ORAL | 2 refills | Status: DC
  Filled 2020-12-21: qty 60, 30d supply, fill #0
  Filled 2021-01-19: qty 60, 30d supply, fill #1
  Filled 2021-02-24: qty 60, 30d supply, fill #2

## 2020-12-22 ENCOUNTER — Other Ambulatory Visit (HOSPITAL_COMMUNITY): Payer: Self-pay

## 2020-12-26 ENCOUNTER — Other Ambulatory Visit (HOSPITAL_COMMUNITY): Payer: Self-pay

## 2021-01-19 ENCOUNTER — Other Ambulatory Visit (HOSPITAL_COMMUNITY): Payer: Self-pay

## 2021-01-24 ENCOUNTER — Other Ambulatory Visit: Payer: Self-pay | Admitting: Oncology

## 2021-01-24 DIAGNOSIS — Z961 Presence of intraocular lens: Secondary | ICD-10-CM | POA: Diagnosis not present

## 2021-01-25 ENCOUNTER — Other Ambulatory Visit (HOSPITAL_COMMUNITY): Payer: Self-pay

## 2021-01-27 ENCOUNTER — Telehealth: Payer: Self-pay | Admitting: Oncology

## 2021-01-27 NOTE — Telephone Encounter (Signed)
Scheduled appts per 5/17 sch msg. Called pt, no answer. Left msg with appts dates and times as well as locations.

## 2021-01-30 NOTE — Progress Notes (Signed)
[facility]  [phone] [phone]    ID: Maria [name]   DOB: 02-Nov-1950  MR#: [id number]  EVO#:[id number]  Patient Care Team: Ginger Organ., MD as PCP - General (Internal Medicine) [name], MD as Consulting Physician (Orthopedic Surgery) Jessica Seidman, [name] Dad, MD as Consulting Physician (Oncology) [name], MD as Consulting Physician (General Surgery) [name], [name], NP as Nurse Practitioner (Hematology and Oncology) OTHER MD:  CHIEF COMPLAINT: Small lymphocytic lymphoma/chronic lymphocytic leukemia  CURRENT TREATMENT: venetoclax, IVIG, Venofer   INTERVAL HISTORY: Maria [name] returns today for follow-up and treatment of her small lymphocytic lymphoma/chronic lymphocytic leukemia.   She continues on venetoclax 200mg  daily.  She thinks this may be causing her some joint stiffness and she feels kind of stiff all over most of the time she says.  She occasionally takes Tylenol for this.  She stopped rivaroxaban approximately 1 month ago.  She has had no overt bleeding or bruising.  She receives IVIG prophylactically every 12 weeks.  She has a dose due today.  Lab Results  Component Value Date   EXHBZJIR 678 (L) 11/09/2020   IGGSERUM 364 (L) 08/11/2020   IGGSERUM 345 (L) 04/20/2020   IGA 27 (L) 11/09/2020   IGA 23 (L) 08/11/2020   IGA 25 (L) 04/20/2020   IGMSERUM <5 (L) 11/09/2020   IGMSERUM <5 (L) 08/11/2020   IGMSERUM <5 (L) 04/20/2020     Since her last visit, she underwent bilateral screening mammography with tomography at [facility] on 11/17/2020 showing: breast density category B; no evidence of malignancy in either breast.   REVIEW OF SYSTEMS: [name] went to an outdoor concert with [name], stood out in an enormous crowd in the rain and did not get sick that is pretty remarkable.  She is exercising daily through the senior center in [place].  She also is trying to improve her diet  but admits to being a sugar Holick.  A detailed review of systems today was otherwise noncontributory   COVID 19 VACCINATION STATUS: Status post Pfizer vaccine x2+ booster August 2021, with subsequent antibody testing negative (no response to vaccines); received 300mg Tixagevimab/300mg  Cilgavimab on 11/29/2020.    HISTORY OF PRESENT ILLNESS: From the original intake note:  Maria [name] developed what she thought was "the flu" in December of 2002. She noted a large lymph node developing in her right anterior cervical area. 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   Her subsequent history is as detailed above.   PAST MEDICAL HISTORY: Past Medical History:  Diagnosis Date  . Allergy   . Arthritis   . Asthma   . Cancer [facility])    chronic lymphacytic leukemia  . CLL (chronic lymphocytic leukemia) ([place])   . Diabetes mellitus    PMH  . Headache    migraine  . Hyperlipidemia   . Hypertension   . Hypothyroidism   . Lymphoma ([place])   . Thyroid disease    hypothyroidism  . Wears glasses     PAST SURGICAL HISTORY: Past Surgical History:  Procedure Laterality Date  . ABDOMINAL HYSTERECTOMY    . AXILLARY LYMPH NODE BIOPSY Right 08/21/2018   Procedure: RIGHT AXILLARY LYMPH NODE BIOPSY ERAS PATHWAY;  Surgeon: [name], MD;  Location: [place] [facility];  Service: General;  Laterality: Right;  LMA  . BREAST EXCISIONAL BIOPSY Right   . COLONOSCOPY    . IR IMAGING GUIDED PORT INSERTION  11/10/2018  .  LYMPH NODE DISSECTION    . STRABISMUS SURGERY    . TONSILLECTOMY    . TOTAL KNEE ARTHROPLASTY Right 12/03/2016   Procedure: RIGHT TOTAL KNEE ARTHROPLASTY;  Surgeon: [name], MD;  Location: [facility];  Service: Orthopedics;  Laterality: Right;    FAMILY HISTORY: Family History  Problem Relation Age of Onset  . Liver cancer Mother   . Heart disease Father   . Breast cancer Sister 62  . Breast cancer Maternal  Grandmother   . Colon cancer Neg Hx   . Pancreatic cancer Neg Hx   . Rectal cancer Neg Hx   . Stomach cancer Neg Hx   The patient's father died at the age of 54 from a myocardial infarction. The patient's mother died at the age of 80 from primary liver carcinoma. The patient had no brothers. Her one sister, [name], has a history of breast cancer   GYNECOLOGIC HISTORY: Menarche at around age 30. The patient is GX P0. 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 She lives by herself. She had two cats, [name] and [place], but they are currently with her sister and [name] thinks they would be more at home there. She attends a [employer], operational.               ADVANCED DIRECTIVES: Completed 02/03/2019. She has named her niece as her 70.   HEALTH MAINTENANCE: Social History   Socioeconomic History  . Marital status: Single    Spouse name: Not on file  . Number of children: Not on file  . Years of education: Not on file  . Highest education level: Not on file  Occupational History  . Not on file  Tobacco Use  . Smoking status: Never Smoker  . Smokeless tobacco: Never Used  Vaping Use  . Vaping Use: Never used  Substance and Sexual Activity  . Alcohol use: Yes    Comment: occasional alcohol intake; once or twice monthly  . Drug use: No  . Sexual activity: Not on file  Other Topics Concern  . Not on file  Social History Narrative  . Not on file   Social Determinants of Health   Financial Resource Strain: Not on file  Food Insecurity: Not on file  Transportation Needs: Not on file  Physical Activity: Not on file  Stress: Not on file  Social Connections: Not on file  Intimate Partner Violence: Not on file                Colonoscopy: 06/01/2014/[facility]             PAP:             Bone density:              Lipid panel:  228/114/50/155/ratio 4.6  On 10/01/2016  ALLERGIES: Allergies  Allergen Reactions  . Iohexol Swelling and Rash  . Compazine [Prochlorperazine Edisylate] Other (See Comments)    Mild confusion  . Contrast Media  [Iodinated Diagnostic Agents]   . Lactose Intolerance (Gi)     Diarrhea, gas bloating  . Cephalexin Rash    CURRENT MEDICATIONS: Current Outpatient Medications  Medication Sig Dispense Refill  . acetaminophen (TYLENOL 8 HOUR ARTHRITIS PAIN) 650 MG CR tablet Take 650 mg by mouth every 8 (eight) hours as needed for pain.    . rosuvastatin (  CRESTOR) 10 MG tablet Take 10 mg by mouth at bedtime.  11  . SYNTHROID 88 MCG tablet Take 88 mcg by mouth daily before breakfast.     . triamterene-hydrochlorothiazide (DYAZIDE) 37.5-25 MG capsule Take 1 each (1 capsule total) by mouth daily. 90 capsule 4  . valACYclovir (VALTREX) 1000 MG tablet Take 1 tablet (1,000 mg total) by mouth daily. 90 tablet 6  . venetoclax (VENCLEXTA) 100 MG tablet TAKE 2 TABLETS BY MOUTH DAILY. TAKE WITH FOOD AND WATER AT APPROXIMATELY THE SAME TIME EACH DAY. 60 tablet 2   No current facility-administered medications for this visit.    OBJECTIVE: white woman who appears well Vitals:   01/31/21 0903  BP: (!) 119/57  [name]: 71  Resp: 18  Temp: 97.7 F (36.5 C)  SpO2: 100%   Wt Readings from Last 3 Encounters:  01/31/21 197 lb 11.2 oz (89.7 kg)  11/09/20 197 lb 4.8 oz (89.5 kg)  08/11/20 196 lb 3.2 oz (89 kg)   Body mass index is 31.91 kg/m.    ECOG FS:1 - Symptomatic but completely ambulatory  Sclerae unicteric, EOMs intact Wearing a mask No cervical or supraclavicular adenopathy Lungs no rales or rhonchi Heart regular rate and rhythm Abd soft, nontender, positive bowel sounds MSK no focal spinal tenderness, no upper extremity lymphedema Neuro: nonfocal, well oriented, appropriate affect Breasts: Deferred   LAB RESULTS: Lab Results  Component Value Date   WBC 3.2 (L)  01/31/2021   NEUTROABS 2.0 01/31/2021   HGB 11.8 (L) 01/31/2021   HCT 37.2 01/31/2021   MCV 79.1 (L) 01/31/2021   PLT 159 01/31/2021   CMP Latest Ref Rng & Units 11/09/2020 08/11/2020 04/20/2020  Glucose 70 - 99 mg/dL 195(H) 92 123(H)  BUN 8 - 23 mg/dL 15 16 13   Creatinine 0.44 - 1.00 mg/dL 1.07(H) 0.95 0.83  Sodium 135 - 145 mmol/L 140 141 141  Potassium 3.5 - 5.1 mmol/L 3.5 3.8 4.0  Chloride 98 - 111 mmol/L 104 109 109  CO2 22 - 32 mmol/L 25 24 24   Calcium 8.9 - 10.3 mg/dL 9.3 9.4 9.8  Total Protein 6.5 - 8.1 g/dL 6.4(L) 6.3(L) 6.0(L)  Total Bilirubin 0.3 - 1.2 mg/dL 0.5 0.4 0.5  Alkaline Phos 38 - 126 U/L 91 88 120  AST 15 - 41 U/L 22 24 20   ALT 0 - 44 U/L 21 18 13     No results for input(s): LABCA2 in the last 72 hours.  Lab Results  Component Value Date   PYPPJKDT 267 (L) 11/09/2020   IGGSERUM 364 (L) 08/11/2020   IGGSERUM 345 (L) 04/20/2020   IGA 27 (L) 11/09/2020   IGA 23 (L) 08/11/2020   IGA 25 (L) 04/20/2020   IGMSERUM <5 (L) 11/09/2020   IGMSERUM <5 (L) 08/11/2020   IGMSERUM <5 (L) 04/20/2020     STUDIES: No results found.   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             (b) rituximab added to ibrutinib, first dose 08/27/2018             (c) hepatitis B studies 08/27/2018 negative             (f) ibrutinib/rituximab discontinued after 10/02/2018 dose w/o obvious response  (6) bendamustine/rituximab started 10/22/2018, discontinued after 1 cycle with rapid progression  (7) obinutuzumab/Gaziva started 11/11/2018  (a) second dose given 11/19/2018 together with chemotherapy  (b) third dose given 12/26/2018 and continuing every 3 weeks  (c) obinutuzumab discontinued after 09/09/2019 dose, with documentation of complete remission  (8) CH[O]P chemotherapy started 11/19/2018, completed 8 doses 05/19/2019             (a) echocardiogram on 11/07/2018 shows EF of 55-60%  (b) second cycle of CH[O]P postponed because of pandemic, given 12/26/2018, greatly reduced doses  (c) cycle 3 of CH[O]P/obinutuzumab given 02/03/2019 at increased doses  (d) cycle 4 and 5 of CH[O]P given on 6/16 and 7/7 at increased doses of 35mg /m2 and 550mg /m2  (e) Repeat echo on 04/22/2019 shows an EF of 60-65%  (9) severe immunocompromise: Marked hypogammaglobulinemia  (a) starting March 2020 she received IVIG every 8 weeks as needed  (b) starting September 2021 she receives IVIG every 3 months  (10) 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  (a) LMWH started 04/23/2019  (b) 75% dose reduction September 2020 secondary to bruising  (c) switched to rivaroxaban 09/10/2019  (d) 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   PLAN:   Bryttany is now a little over 18 years from her initial diagnosis of chronic lymphoid leukemia.  Her disease is very well controlled on venetoclax and she is not having intercurrent infections or other immune issues as noted by her recent visit to [name] noted above  She is iron deficient and she will be receiving iron today and weekly x5.  I think that will correct the small anemia and hopefully help her functional status a bit  She will need repeat Evusheld when she returns in 12 weeks for her next IVIG dose.  This point I am delighted at how well she is doing.  She knows to call for any other issue that may develop before the next visit.  Total encounter time 25 minutes.Sarajane Jews C. Elease Swarm, MD 01/31/21 9:06 AM Medical Oncology and Hematology [facility] [place], [place] [address] Tel. [phone]    Fax. [phone]   I, Wilburn Mylar, am acting as scribe for Dr. [name] Dad. Naudia Crosley.  I, Lurline Del MD, have reviewed the above documentation for accuracy and completeness, and I agree with the above.   *  Total Encounter Time as defined by the Centers for Medicare and Medicaid Services includes, in addition to the face-to-face time of a patient visit (documented in the note above) non-face-to-face time: obtaining and reviewing outside history, ordering and reviewing medications, tests or procedures, care coordination (communications with other health care professionals or caregivers) and documentation in the medical record.

## 2021-01-31 ENCOUNTER — Inpatient Hospital Stay: Payer: Medicare PPO | Admitting: Oncology

## 2021-01-31 ENCOUNTER — Inpatient Hospital Stay: Payer: Medicare PPO

## 2021-01-31 ENCOUNTER — Inpatient Hospital Stay: Payer: Medicare PPO | Attending: Oncology

## 2021-01-31 ENCOUNTER — Other Ambulatory Visit: Payer: Self-pay

## 2021-01-31 VITALS — BP 127/46 | HR 63 | Temp 98.1°F | Resp 17

## 2021-01-31 VITALS — BP 119/57 | HR 71 | Temp 97.7°F | Resp 18 | Ht 66.0 in | Wt 197.7 lb

## 2021-01-31 DIAGNOSIS — D801 Nonfamilial hypogammaglobulinemia: Secondary | ICD-10-CM | POA: Diagnosis not present

## 2021-01-31 DIAGNOSIS — C9110 Chronic lymphocytic leukemia of B-cell type not having achieved remission: Secondary | ICD-10-CM | POA: Diagnosis not present

## 2021-01-31 DIAGNOSIS — Z95828 Presence of other vascular implants and grafts: Secondary | ICD-10-CM

## 2021-01-31 DIAGNOSIS — E611 Iron deficiency: Secondary | ICD-10-CM | POA: Diagnosis not present

## 2021-01-31 LAB — COMPREHENSIVE METABOLIC PANEL
ALT: 13 U/L (ref 0–44)
AST: 18 U/L (ref 15–41)
Albumin: 3.7 g/dL (ref 3.5–5.0)
Alkaline Phosphatase: 74 U/L (ref 38–126)
Anion gap: 11 (ref 5–15)
BUN: 16 mg/dL (ref 8–23)
CO2: 27 mmol/L (ref 22–32)
Calcium: 9.4 mg/dL (ref 8.9–10.3)
Chloride: 102 mmol/L (ref 98–111)
Creatinine, Ser: 1.16 mg/dL — ABNORMAL HIGH (ref 0.44–1.00)
GFR, Estimated: 51 mL/min — ABNORMAL LOW (ref >60)
Glucose, Bld: 205 mg/dL — ABNORMAL HIGH (ref 70–99)
Potassium: 3.2 mmol/L — ABNORMAL LOW (ref 3.5–5.1)
Sodium: 140 mmol/L (ref 135–145)
Total Bilirubin: 0.6 mg/dL (ref 0.3–1.2)
Total Protein: 6.4 g/dL — ABNORMAL LOW (ref 6.5–8.1)

## 2021-01-31 LAB — CBC WITH DIFFERENTIAL/PLATELET
Abs Immature Granulocytes: 0 10*3/uL (ref 0.00–0.07)
Basophils Absolute: 0 10*3/uL (ref 0.0–0.1)
Basophils Relative: 1 %
Eosinophils Absolute: 0 10*3/uL (ref 0.0–0.5)
Eosinophils Relative: 1 %
HCT: 37.2 % (ref 36.0–46.0)
Hemoglobin: 11.8 g/dL — ABNORMAL LOW (ref 12.0–15.0)
Immature Granulocytes: 0 %
Lymphocytes Relative: 24 %
Lymphs Abs: 0.8 10*3/uL (ref 0.7–4.0)
MCH: 25.1 pg — ABNORMAL LOW (ref 26.0–34.0)
MCHC: 31.7 g/dL (ref 30.0–36.0)
MCV: 79.1 fL — ABNORMAL LOW (ref 80.0–100.0)
Monocytes Absolute: 0.4 10*3/uL (ref 0.1–1.0)
Monocytes Relative: 13 %
Neutro Abs: 2 10*3/uL (ref 1.7–7.7)
Neutrophils Relative %: 61 %
Platelets: 159 10*3/uL (ref 150–400)
RBC: 4.7 MIL/uL (ref 3.87–5.11)
RDW: 16.9 % — ABNORMAL HIGH (ref 11.5–15.5)
WBC: 3.2 10*3/uL — ABNORMAL LOW (ref 4.0–10.5)
nRBC: 0 % (ref 0.0–0.2)

## 2021-01-31 LAB — RETICULOCYTES
Immature Retic Fract: 17.4 % — ABNORMAL HIGH (ref 2.3–15.9)
RBC.: 4.69 MIL/uL (ref 3.87–5.11)
Retic Count, Absolute: 55.8 10*3/uL (ref 19.0–186.0)
Retic Ct Pct: 1.2 % (ref 0.4–3.1)

## 2021-01-31 LAB — LACTATE DEHYDROGENASE: LDH: 151 U/L (ref 98–192)

## 2021-01-31 LAB — FERRITIN: Ferritin: 7 ng/mL — ABNORMAL LOW (ref 11–307)

## 2021-01-31 MED ORDER — SODIUM CHLORIDE 0.9 % IV SOLN
INTRAVENOUS | Status: DC
  Filled 2021-01-31: qty 250

## 2021-01-31 MED ORDER — DIPHENHYDRAMINE HCL 25 MG PO CAPS
ORAL_CAPSULE | ORAL | Status: AC
  Filled 2021-01-31: qty 1

## 2021-01-31 MED ORDER — IMMUNE GLOBULIN (HUMAN) 10 GM/100ML IV SOLN
1.0000 g/kg | Freq: Once | INTRAVENOUS | Status: AC
  Administered 2021-01-31: 90 g via INTRAVENOUS
  Filled 2021-01-31: qty 100

## 2021-01-31 MED ORDER — ACETAMINOPHEN 325 MG PO TABS
ORAL_TABLET | ORAL | Status: AC
  Filled 2021-01-31: qty 2

## 2021-01-31 MED ORDER — DIPHENHYDRAMINE HCL 25 MG PO TABS
25.0000 mg | ORAL_TABLET | Freq: Once | ORAL | Status: AC
  Administered 2021-01-31: 25 mg via ORAL
  Filled 2021-01-31: qty 1

## 2021-01-31 MED ORDER — ACETAMINOPHEN 325 MG PO TABS
650.0000 mg | ORAL_TABLET | Freq: Once | ORAL | Status: AC
  Administered 2021-01-31: 650 mg via ORAL

## 2021-01-31 MED ORDER — DEXTROSE 5 % IV SOLN
INTRAVENOUS | Status: DC
  Filled 2021-01-31: qty 250

## 2021-01-31 MED ORDER — HEPARIN SOD (PORK) LOCK FLUSH 100 UNIT/ML IV SOLN
500.0000 [IU] | Freq: Once | INTRAVENOUS | Status: AC
  Administered 2021-01-31: 500 [IU]
  Filled 2021-01-31: qty 5

## 2021-01-31 MED ORDER — SODIUM CHLORIDE 0.9% FLUSH
10.0000 mL | Freq: Once | INTRAVENOUS | Status: AC
  Administered 2021-01-31: 10 mL
  Filled 2021-01-31: qty 10

## 2021-01-31 MED ORDER — SODIUM CHLORIDE 0.9 % IV SOLN
200.0000 mg | Freq: Once | INTRAVENOUS | Status: AC
  Administered 2021-01-31: 200 mg via INTRAVENOUS
  Filled 2021-01-31: qty 200

## 2021-01-31 NOTE — Patient Instructions (Signed)

## 2021-02-01 ENCOUNTER — Telehealth: Payer: Self-pay | Admitting: Oncology

## 2021-02-01 LAB — IGG, IGA, IGM
IgA: 28 mg/dL — ABNORMAL LOW (ref 87–352)
IgG (Immunoglobin G), Serum: 478 mg/dL — ABNORMAL LOW (ref 586–1602)
IgM (Immunoglobulin M), Srm: <5 mg/dL — ABNORMAL LOW (ref 26–217)

## 2021-02-01 NOTE — Telephone Encounter (Signed)
Scheduled per 5/24 los. Pt will receive an updated appt calendar per next visit appt notes

## 2021-02-09 ENCOUNTER — Inpatient Hospital Stay: Payer: Medicare PPO | Attending: Oncology

## 2021-02-09 ENCOUNTER — Other Ambulatory Visit: Payer: Self-pay

## 2021-02-09 VITALS — BP 106/78 | HR 74 | Temp 98.4°F | Resp 18

## 2021-02-09 DIAGNOSIS — C9110 Chronic lymphocytic leukemia of B-cell type not having achieved remission: Secondary | ICD-10-CM

## 2021-02-09 DIAGNOSIS — D801 Nonfamilial hypogammaglobulinemia: Secondary | ICD-10-CM

## 2021-02-09 DIAGNOSIS — E611 Iron deficiency: Secondary | ICD-10-CM | POA: Diagnosis not present

## 2021-02-09 DIAGNOSIS — Z95828 Presence of other vascular implants and grafts: Secondary | ICD-10-CM

## 2021-02-09 MED ORDER — SODIUM CHLORIDE 0.9% FLUSH
10.0000 mL | Freq: Once | INTRAVENOUS | Status: AC
  Administered 2021-02-09: 10 mL
  Filled 2021-02-09: qty 10

## 2021-02-09 MED ORDER — SODIUM CHLORIDE 0.9 % IV SOLN
Freq: Once | INTRAVENOUS | Status: AC
  Filled 2021-02-09: qty 250

## 2021-02-09 MED ORDER — SODIUM CHLORIDE 0.9 % IV SOLN
200.0000 mg | Freq: Once | INTRAVENOUS | Status: AC
  Administered 2021-02-09: 200 mg via INTRAVENOUS
  Filled 2021-02-09: qty 200

## 2021-02-09 MED ORDER — HEPARIN SOD (PORK) LOCK FLUSH 100 UNIT/ML IV SOLN
500.0000 [IU] | Freq: Once | INTRAVENOUS | Status: AC
  Administered 2021-02-09: 500 [IU]
  Filled 2021-02-09: qty 5

## 2021-02-09 NOTE — Patient Instructions (Signed)

## 2021-02-15 ENCOUNTER — Other Ambulatory Visit (HOSPITAL_COMMUNITY): Payer: Self-pay

## 2021-02-16 ENCOUNTER — Inpatient Hospital Stay: Payer: Medicare PPO

## 2021-02-16 ENCOUNTER — Other Ambulatory Visit: Payer: Self-pay

## 2021-02-16 VITALS — BP 135/54 | HR 71 | Temp 98.4°F | Resp 18

## 2021-02-16 DIAGNOSIS — Z95828 Presence of other vascular implants and grafts: Secondary | ICD-10-CM

## 2021-02-16 DIAGNOSIS — E611 Iron deficiency: Secondary | ICD-10-CM | POA: Diagnosis not present

## 2021-02-16 DIAGNOSIS — C9110 Chronic lymphocytic leukemia of B-cell type not having achieved remission: Secondary | ICD-10-CM

## 2021-02-16 DIAGNOSIS — D801 Nonfamilial hypogammaglobulinemia: Secondary | ICD-10-CM

## 2021-02-16 MED ORDER — SODIUM CHLORIDE 0.9% FLUSH
10.0000 mL | Freq: Once | INTRAVENOUS | Status: AC
  Administered 2021-02-16: 10 mL
  Filled 2021-02-16: qty 10

## 2021-02-16 MED ORDER — HEPARIN SOD (PORK) LOCK FLUSH 100 UNIT/ML IV SOLN
500.0000 [IU] | Freq: Once | INTRAVENOUS | Status: AC
  Administered 2021-02-16: 500 [IU]
  Filled 2021-02-16: qty 5

## 2021-02-16 MED ORDER — SODIUM CHLORIDE 0.9 % IV SOLN
200.0000 mg | Freq: Once | INTRAVENOUS | Status: AC
Start: 2021-02-16 — End: 2021-02-16
  Administered 2021-02-16: 200 mg via INTRAVENOUS
  Filled 2021-02-16: qty 200

## 2021-02-16 MED ORDER — SODIUM CHLORIDE 0.9 % IV SOLN
Freq: Once | INTRAVENOUS | Status: AC
  Filled 2021-02-16: qty 250

## 2021-02-16 NOTE — Patient Instructions (Signed)

## 2021-02-21 ENCOUNTER — Inpatient Hospital Stay: Payer: Medicare PPO

## 2021-02-21 ENCOUNTER — Other Ambulatory Visit: Payer: Self-pay

## 2021-02-21 VITALS — BP 125/55 | HR 68 | Temp 98.0°F | Resp 16

## 2021-02-21 DIAGNOSIS — D801 Nonfamilial hypogammaglobulinemia: Secondary | ICD-10-CM

## 2021-02-21 DIAGNOSIS — Z95828 Presence of other vascular implants and grafts: Secondary | ICD-10-CM

## 2021-02-21 DIAGNOSIS — E611 Iron deficiency: Secondary | ICD-10-CM | POA: Diagnosis not present

## 2021-02-21 DIAGNOSIS — C9110 Chronic lymphocytic leukemia of B-cell type not having achieved remission: Secondary | ICD-10-CM

## 2021-02-21 MED ORDER — SODIUM CHLORIDE 0.9 % IV SOLN
200.0000 mg | Freq: Once | INTRAVENOUS | Status: AC
  Administered 2021-02-21: 200 mg via INTRAVENOUS
  Filled 2021-02-21: qty 200

## 2021-02-21 MED ORDER — SODIUM CHLORIDE 0.9 % IV SOLN
INTRAVENOUS | Status: DC
  Filled 2021-02-21: qty 250

## 2021-02-21 MED ORDER — HEPARIN SOD (PORK) LOCK FLUSH 100 UNIT/ML IV SOLN
500.0000 [IU] | Freq: Once | INTRAVENOUS | Status: AC
  Administered 2021-02-21: 500 [IU]
  Filled 2021-02-21: qty 5

## 2021-02-21 MED ORDER — SODIUM CHLORIDE 0.9% FLUSH
10.0000 mL | Freq: Once | INTRAVENOUS | Status: AC
Start: 2021-02-21 — End: 2021-02-21
  Administered 2021-02-21: 10 mL
  Filled 2021-02-21: qty 10

## 2021-02-21 NOTE — Patient Instructions (Signed)

## 2021-02-24 ENCOUNTER — Other Ambulatory Visit (HOSPITAL_COMMUNITY): Payer: Self-pay

## 2021-02-28 ENCOUNTER — Other Ambulatory Visit: Payer: Self-pay

## 2021-02-28 ENCOUNTER — Inpatient Hospital Stay: Payer: Medicare PPO

## 2021-02-28 VITALS — BP 140/67 | HR 80 | Temp 98.0°F | Resp 18

## 2021-02-28 DIAGNOSIS — D801 Nonfamilial hypogammaglobulinemia: Secondary | ICD-10-CM

## 2021-02-28 DIAGNOSIS — Z95828 Presence of other vascular implants and grafts: Secondary | ICD-10-CM

## 2021-02-28 DIAGNOSIS — E611 Iron deficiency: Secondary | ICD-10-CM | POA: Diagnosis not present

## 2021-02-28 DIAGNOSIS — C9110 Chronic lymphocytic leukemia of B-cell type not having achieved remission: Secondary | ICD-10-CM

## 2021-02-28 MED ORDER — SODIUM CHLORIDE 0.9 % IV SOLN
200.0000 mg | Freq: Once | INTRAVENOUS | Status: AC
  Administered 2021-02-28: 200 mg via INTRAVENOUS
  Filled 2021-02-28: qty 200

## 2021-02-28 MED ORDER — HEPARIN SOD (PORK) LOCK FLUSH 100 UNIT/ML IV SOLN
500.0000 [IU] | Freq: Once | INTRAVENOUS | Status: AC
  Administered 2021-02-28: 500 [IU]
  Filled 2021-02-28: qty 5

## 2021-02-28 MED ORDER — SODIUM CHLORIDE 0.9 % IV SOLN
INTRAVENOUS | Status: DC
  Filled 2021-02-28: qty 250

## 2021-02-28 MED ORDER — SODIUM CHLORIDE 0.9% FLUSH
10.0000 mL | Freq: Once | INTRAVENOUS | Status: AC
  Administered 2021-02-28: 10 mL
  Filled 2021-02-28: qty 10

## 2021-02-28 NOTE — Patient Instructions (Signed)

## 2021-03-22 ENCOUNTER — Other Ambulatory Visit: Payer: Self-pay | Admitting: Oncology

## 2021-03-22 ENCOUNTER — Other Ambulatory Visit (HOSPITAL_COMMUNITY): Payer: Self-pay

## 2021-03-22 DIAGNOSIS — C9110 Chronic lymphocytic leukemia of B-cell type not having achieved remission: Secondary | ICD-10-CM

## 2021-03-22 MED ORDER — VENETOCLAX 100 MG PO TABS
ORAL_TABLET | ORAL | 2 refills | Status: DC
  Filled 2021-03-22: qty 60, fill #0
  Filled 2021-03-27: qty 60, 30d supply, fill #0
  Filled 2021-04-19: qty 60, 30d supply, fill #1
  Filled 2021-05-09: qty 60, 30d supply, fill #2

## 2021-03-23 ENCOUNTER — Other Ambulatory Visit (HOSPITAL_COMMUNITY): Payer: Self-pay

## 2021-03-27 ENCOUNTER — Other Ambulatory Visit (HOSPITAL_COMMUNITY): Payer: Self-pay

## 2021-04-19 ENCOUNTER — Other Ambulatory Visit (HOSPITAL_COMMUNITY): Payer: Self-pay

## 2021-04-21 ENCOUNTER — Other Ambulatory Visit: Payer: Self-pay | Admitting: Oncology

## 2021-04-24 ENCOUNTER — Other Ambulatory Visit (HOSPITAL_COMMUNITY): Payer: Self-pay

## 2021-04-24 NOTE — Progress Notes (Signed)
[place]  [phone] [phone]      ID: SHANISE KLAUER   DOB: 09-05-1951  MR#: [id number]  [id number]   Patient Care Team: Ginger Organ., MD as PCP - General (Internal Medicine) [name], MD as Consulting Physician (Orthopedic Surgery) Naiomi Musto, [name] Dad, MD as Consulting Physician (Oncology) [name], MD as Consulting Physician (General Surgery) [name], [name], NP as Nurse Practitioner (Hematology and Oncology) OTHER MD:   CHIEF COMPLAINT: Small lymphocytic lymphoma/chronic lymphocytic leukemia   CURRENT TREATMENT: venetoclax, IVIG    INTERVAL HISTORY: Jaxi returns today for follow-up and treatment of her small lymphocytic lymphoma/chronic lymphocytic leukemia.   She continues on venetoclax '200mg'$  daily.  She was having some joint stiffness which she thought might be due to the medication but she is now exercising regularly at the senior center, just about every day, and the soreness has pretty much resolved.  She receives IVIG prophylactically every 12 weeks.  She has a dose due today.  She has tolerated this with no difficulty.  Lab Results  Component Value Date   [id number] (L) 01/31/2021   IGGSERUM 454 (L) 11/09/2020   IGGSERUM 364 (L) 08/11/2020   IGA 28 (L) 01/31/2021   IGA 27 (L) 11/09/2020   IGA 23 (L) 08/11/2020   IGMSERUM <5 (L) 01/31/2021   IGMSERUM <5 (L) 11/09/2020   IGMSERUM <5 (L) 08/11/2020     Since her last visit, she underwent bilateral screening mammography with tomography at [facility] on 11/17/2020 showing: breast density category B; no evidence of malignancy in either breast.  She received Venofer x3 in June.  She tolerated this well.  She has had a very nice response as documented below.  Also she receives Evusheld today, 6 months after her first dose.   REVIEW OF SYSTEMS: Kade cut her hair short in preparation for a trip to the [place] with family.   Some of her family are not vaccinated although those who have not been vaccinated have had COVID.  As noted above she is exercising daily at the senior center in [place].  Even her knees are improved.  A detailed review of systems today was otherwise stable   COVID 19 VACCINATION STATUS: Status post Pfizer vaccine x2+ booster August 2021, with subsequent antibody testing negative (no response to vaccines); received '300mg'$ Tixagevimab/'300mg'$  Cilgavimab on 11/29/2020.    HISTORY OF PRESENT ILLNESS: From the original intake note:   [name] developed what she thought was "the flu" in December of 2002. She noted a large lymph node developing in her right anterior cervical area. 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    Her subsequent history is as detailed above.     PAST MEDICAL HISTORY: Past Medical History:  Diagnosis Date   Allergy    Arthritis    Asthma    Cancer ([place])    chronic lymphacytic leukemia   CLL (chronic lymphocytic leukemia) ([place])    Diabetes mellitus    PMH   Headache    migraine   Hyperlipidemia    Hypertension    Hypothyroidism    Lymphoma ([place])    Thyroid disease    hypothyroidism   Wears glasses      PAST SURGICAL HISTORY: Past Surgical History:  Procedure Laterality Date   ABDOMINAL HYSTERECTOMY     AXILLARY LYMPH NODE BIOPSY Right 08/21/2018   Procedure: RIGHT AXILLARY LYMPH NODE BIOPSY  ERAS PATHWAY;  Surgeon: [name], MD;  Location: [facility];  Service: General;  Laterality: Right;  LMA   BREAST EXCISIONAL BIOPSY Right    COLONOSCOPY     IR IMAGING GUIDED PORT INSERTION  11/10/2018   LYMPH NODE DISSECTION     STRABISMUS SURGERY     TONSILLECTOMY     TOTAL KNEE ARTHROPLASTY Right 12/03/2016   Procedure: RIGHT TOTAL KNEE ARTHROPLASTY;  Surgeon: [name], MD;  Location: [facility];  Service: Orthopedics;  Laterality: Right;     FAMILY HISTORY: Family History  Problem  Relation Age of Onset   Liver cancer Mother    Heart disease Father    Breast cancer Sister 72   Breast cancer Maternal Grandmother    Colon cancer Neg Hx    Pancreatic cancer Neg Hx    Rectal cancer Neg Hx    Stomach cancer Neg Hx   The patient's father died at the age of 2 from a myocardial infarction. The patient's mother died at the age of 75 from primary liver carcinoma. The patient had no brothers. Her one sister, [name], has a history of breast cancer     GYNECOLOGIC HISTORY: Menarche at around age 24. The patient is GX P0. 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 She lives by herself. She had two cats, [name] and [employer], but they are currently with her sister and Shelie thinks they would be more at home there. She attends a [employer], operational.               ADVANCED DIRECTIVES: Completed 02/03/2019. She has named her niece as her 56.     HEALTH MAINTENANCE: Social History   Socioeconomic History   Marital status: Single    Spouse name: Not on file   Number of children: Not on file   Years of education: Not on file   Highest education level: Not on file  Occupational History   Not on file  Tobacco Use   Smoking status: Never   Smokeless tobacco: Never  Vaping Use   Vaping Use: Never used  Substance and Sexual Activity   Alcohol use: Yes    Comment: occasional alcohol intake; once or twice monthly   Drug use: No   Sexual activity: Not on file  Other Topics Concern   Not on file  Social History Narrative   Not on file   Social Determinants of Health   Financial Resource Strain: Not on file  Food Insecurity: Not on file  Transportation Needs: Not on file  Physical Activity: Not on file  Stress: Not on file  Social Connections: Not on file  Intimate Partner Violence: Not on file                  Colonoscopy: 06/01/2014/[facility]             PAP:             Bone density:             Lipid panel:  228/114/50/155/ratio 4.6  On 10/01/2016  ALLERGIES: Allergies  Allergen Reactions   Iohexol Swelling and Rash   Compazine [Prochlorperazine Edisylate] Other (See Comments)    Mild confusion   Contrast Media  [Iodinated Diagnostic Agents]    Lactose Intolerance (Gi)     Diarrhea, gas  bloating   Cephalexin Rash     CURRENT MEDICATIONS: Current Outpatient Medications  Medication Sig Dispense Refill   acetaminophen (TYLENOL 8 HOUR ARTHRITIS PAIN) 650 MG CR tablet Take 650 mg by mouth every 8 (eight) hours as needed for pain.     rosuvastatin (CRESTOR) 10 MG tablet Take 10 mg by mouth at bedtime.  11   SYNTHROID 88 MCG tablet Take 88 mcg by mouth daily before breakfast.      triamterene-hydrochlorothiazide (DYAZIDE) 37.5-25 MG capsule Take 1 each (1 capsule total) by mouth daily. 90 capsule 4   valACYclovir (VALTREX) 1000 MG tablet TAKE 1 TABLET (1,000 MG TOTAL) BY MOUTH DAILY. 90 tablet 6   venetoclax (VENCLEXTA) 100 MG tablet TAKE 2 TABLETS BY MOUTH DAILY. TAKE WITH FOOD AND WATER AT APPROXIMATELY THE SAME TIME EACH DAY. 60 tablet 2   No current facility-administered medications for this visit.   Facility-Administered Medications Ordered in Other Visits  Medication Dose Route Frequency Provider Last Rate Last Admin   acetaminophen (TYLENOL) tablet 650 mg  650 mg Oral Once Sherrita Riederer, [name] Dad, MD       dextrose 5 % solution   Intravenous Continuous Kioni Stahl, [name] Dad, MD 20 mL/hr at 04/25/21 0957 New Bag at 04/25/21 0957   diphenhydrAMINE (BENADRYL) capsule 25 mg  25 mg Oral Once Lilie Vezina, [name] Dad, MD       heparin lock flush 100 unit/mL  500 Units Intracatheter Once Cobi Delph, [name] Dad, MD       Immune Globulin 10% (PRIVIGEN) IV infusion 90 g  1 g/kg Intravenous Once Herman Fiero, [name] Dad, MD       sodium chloride flush (NS) 0.9 % injection 10 mL  10 mL Intracatheter Once  Byrant Valent, [name] Dad, MD         OBJECTIVE: white woman who appears well Vitals:   04/25/21 0846  BP: (!) 125/56  Pulse: 65  Resp: 18  Temp: (!) 97.5 F (36.4 C)  SpO2: 97%   Wt Readings from Last 3 Encounters:  04/25/21 195 lb 12.8 oz (88.8 kg)  01/31/21 197 lb 11.2 oz (89.7 kg)  11/09/20 197 lb 4.8 oz (89.5 kg)   Body mass index is 31.6 kg/m.    ECOG FS:1 - Symptomatic but completely ambulatory  Sclerae unicteric, EOMs intact Wearing a mask No cervical or supraclavicular adenopathy Lungs no rales or rhonchi Heart regular rate and rhythm Abd soft, nontender, positive bowel sounds MSK no focal spinal tenderness, no upper extremity lymphedema Neuro: nonfocal, well oriented, appropriate affect Breasts: Deferred   LAB RESULTS: Lab Results  Component Value Date   WBC 4.6 04/25/2021   NEUTROABS 2.8 04/25/2021   HGB 15.7 (H) 04/25/2021   HCT 46.0 04/25/2021   MCV 89.0 04/25/2021   PLT 149 (L) 04/25/2021   CMP Latest Ref Rng & Units 04/25/2021 01/31/2021 11/09/2020  Glucose 70 - 99 mg/dL 179(H) 205(H) 195(H)  BUN 8 - 23 mg/dL '22 16 15  '$ Creatinine 0.44 - 1.00 mg/dL 1.25(H) 1.16(H) 1.07(H)  Sodium 135 - 145 mmol/L 139 140 140  Potassium 3.5 - 5.1 mmol/L 3.1(L) 3.2(L) 3.5  Chloride 98 - 111 mmol/L 101 102 104  CO2 22 - 32 mmol/L '26 27 25  '$ Calcium 8.9 - 10.3 mg/dL 9.9 9.4 9.3  Total Protein 6.5 - 8.1 g/dL 6.8 6.4(L) 6.4(L)  Total Bilirubin 0.3 - 1.2 mg/dL 0.8 0.6 0.5  Alkaline Phos 38 - 126 U/L 94 74 91  AST 15 - 41 U/L 18 18 22  ALT 0 - 44 U/L '18 13 21    '$ No results for input(s): LABCA2 in the last 72 hours.   Lab Results  Component Value Date   [id number] (L) 01/31/2021   IGGSERUM 454 (L) 11/09/2020   IGGSERUM 364 (L) 08/11/2020   IGA 28 (L) 01/31/2021   IGA 27 (L) 11/09/2020   IGA 23 (L) 08/11/2020   IGMSERUM <5 (L) 01/31/2021   IGMSERUM <5 (L) 11/09/2020   IGMSERUM <5 (L) 08/11/2020     STUDIES: No results found.    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             (b) rituximab added to ibrutinib, first dose 08/27/2018             (c) hepatitis B studies 08/27/2018 negative             (f) ibrutinib/rituximab discontinued after 10/02/2018 dose w/o obvious response   (6) bendamustine/rituximab started 10/22/2018, discontinued after 1 cycle with rapid progression   (7) obinutuzumab/Gaziva started 11/11/2018  (a) second dose given 11/19/2018 together with chemotherapy  (b) third dose given 12/26/2018 and continuing every 3 weeks  (c) obinutuzumab discontinued after 09/09/2019 dose, with documentation of complete remission   (8) CH[O]P chemotherapy started 11/19/2018, completed 8 doses 05/19/2019             (a) echocardiogram on 11/07/2018 shows EF of 55-60%  (b) second cycle of CH[O]P postponed because of pandemic, given 12/26/2018, greatly reduced doses  (c) cycle 3 of CH[O]P/obinutuzumab given 02/03/2019 at increased doses  (d) cycle 4 and 5 of CH[O]P given on 6/16 and 7/7 at increased doses of '35mg'$ /m2 and '550mg'$ /m2  (e)  Repeat echo on 04/22/2019 shows an EF of 60-65%  (9) severe immunocompromise: Marked hypogammaglobulinemia  (a) starting March 2020 she received IVIG every 8 weeks as needed  (b) starting September 2021 she receives IVIG every 3 months  (c) received Evusheld 11/21/2020  (10) 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  (a) LMWH started 04/23/2019  (b) 75% dose reduction September 2020 secondary to bruising  (c) switched to rivaroxaban 09/10/2019  (d) 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   PLAN:   Matilde is now more than 19 years out from initial diagnosis of chronic lymphoid leukemia.  Her disease is currently very well controlled on venetoclax and she is tolerating it well.  She is profoundly immunocompromised and is unlikely to derive any benefit from vaccines although we do encourage her to receive them.  When the new COVID-vaccine comes out in September/October as she will receive that.  In the meantime she will receive her second dose of Evusheld today.  She is going on a family vacation which is wonderful except some relatives have not been vaccinated and are therefore likely to be rather careless with prevention.  She needs to stay away from them at least 6 feet, avoid hugs cases and even handshakes.  She will certainly wear her mask while indoors.  I commended her excellent exercise program  She will return to see me in 3 months.  Before that visit she  will have a repeat PET scan.  She will receive IVIG today and at the next visit  Total encounter time 30 minutes.Sarajane Jews C. Pookela Sellin, MD 04/25/21 10:11 AM Medical Oncology and Hematology [facility] [place], [place] [address] Tel. [phone]    Fax. [phone]   I, Wilburn Mylar, am acting as scribe for Dr. [name] Dad. Anaeli Cornwall.  I, Lurline Del MD, have reviewed the above documentation for accuracy and completeness, and I agree with the above.   *Total Encounter Time as defined by the Centers for Medicare and Medicaid Services includes, in addition to the face-to-face time of a patient visit (documented in the note above) non-face-to-face time: obtaining and reviewing outside history, ordering and reviewing medications, tests or procedures, care coordination (communications with other health care professionals or caregivers) and documentation in the medical record.

## 2021-04-25 ENCOUNTER — Inpatient Hospital Stay: Payer: Medicare PPO | Attending: Oncology

## 2021-04-25 ENCOUNTER — Inpatient Hospital Stay: Payer: Medicare PPO

## 2021-04-25 ENCOUNTER — Inpatient Hospital Stay: Payer: Medicare PPO | Admitting: Oncology

## 2021-04-25 ENCOUNTER — Other Ambulatory Visit: Payer: Self-pay

## 2021-04-25 VITALS — BP 124/60 | HR 61 | Temp 98.0°F | Resp 18

## 2021-04-25 VITALS — BP 125/56 | HR 65 | Temp 97.5°F | Resp 18 | Ht 66.0 in | Wt 195.8 lb

## 2021-04-25 DIAGNOSIS — C9110 Chronic lymphocytic leukemia of B-cell type not having achieved remission: Secondary | ICD-10-CM

## 2021-04-25 DIAGNOSIS — Z95828 Presence of other vascular implants and grafts: Secondary | ICD-10-CM

## 2021-04-25 DIAGNOSIS — D696 Thrombocytopenia, unspecified: Secondary | ICD-10-CM | POA: Diagnosis not present

## 2021-04-25 DIAGNOSIS — Z298 Encounter for other specified prophylactic measures: Secondary | ICD-10-CM | POA: Diagnosis not present

## 2021-04-25 DIAGNOSIS — D801 Nonfamilial hypogammaglobulinemia: Secondary | ICD-10-CM

## 2021-04-25 DIAGNOSIS — D849 Immunodeficiency, unspecified: Secondary | ICD-10-CM | POA: Diagnosis not present

## 2021-04-25 DIAGNOSIS — T451X5A Adverse effect of antineoplastic and immunosuppressive drugs, initial encounter: Secondary | ICD-10-CM | POA: Diagnosis not present

## 2021-04-25 DIAGNOSIS — G620 Drug-induced polyneuropathy: Secondary | ICD-10-CM | POA: Diagnosis not present

## 2021-04-25 DIAGNOSIS — Z9221 Personal history of antineoplastic chemotherapy: Secondary | ICD-10-CM | POA: Insufficient documentation

## 2021-04-25 LAB — CBC WITH DIFFERENTIAL/PLATELET
Abs Immature Granulocytes: 0.01 10*3/uL (ref 0.00–0.07)
Basophils Absolute: 0 10*3/uL (ref 0.0–0.1)
Basophils Relative: 1 %
Eosinophils Absolute: 0 10*3/uL (ref 0.0–0.5)
Eosinophils Relative: 0 %
HCT: 46 % (ref 36.0–46.0)
Hemoglobin: 15.7 g/dL — ABNORMAL HIGH (ref 12.0–15.0)
Immature Granulocytes: 0 %
Lymphocytes Relative: 24 %
Lymphs Abs: 1.1 10*3/uL (ref 0.7–4.0)
MCH: 30.4 pg (ref 26.0–34.0)
MCHC: 34.1 g/dL (ref 30.0–36.0)
MCV: 89 fL (ref 80.0–100.0)
Monocytes Absolute: 0.6 10*3/uL (ref 0.1–1.0)
Monocytes Relative: 13 %
Neutro Abs: 2.8 10*3/uL (ref 1.7–7.7)
Neutrophils Relative %: 62 %
Platelets: 149 10*3/uL — ABNORMAL LOW (ref 150–400)
RBC: 5.17 MIL/uL — ABNORMAL HIGH (ref 3.87–5.11)
RDW: 16.4 % — ABNORMAL HIGH (ref 11.5–15.5)
WBC: 4.6 10*3/uL (ref 4.0–10.5)
nRBC: 0 % (ref 0.0–0.2)

## 2021-04-25 LAB — COMPREHENSIVE METABOLIC PANEL
ALT: 18 U/L (ref 0–44)
AST: 18 U/L (ref 15–41)
Albumin: 4.3 g/dL (ref 3.5–5.0)
Alkaline Phosphatase: 94 U/L (ref 38–126)
Anion gap: 12 (ref 5–15)
BUN: 22 mg/dL (ref 8–23)
CO2: 26 mmol/L (ref 22–32)
Calcium: 9.9 mg/dL (ref 8.9–10.3)
Chloride: 101 mmol/L (ref 98–111)
Creatinine, Ser: 1.25 mg/dL — ABNORMAL HIGH (ref 0.44–1.00)
GFR, Estimated: 46 mL/min — ABNORMAL LOW (ref >60)
Glucose, Bld: 179 mg/dL — ABNORMAL HIGH (ref 70–99)
Potassium: 3.1 mmol/L — ABNORMAL LOW (ref 3.5–5.1)
Sodium: 139 mmol/L (ref 135–145)
Total Bilirubin: 0.8 mg/dL (ref 0.3–1.2)
Total Protein: 6.8 g/dL (ref 6.5–8.1)

## 2021-04-25 LAB — LACTATE DEHYDROGENASE: LDH: 155 U/L (ref 98–192)

## 2021-04-25 MED ORDER — DEXTROSE 5 % IV SOLN: INTRAVENOUS | Status: DC

## 2021-04-25 MED ORDER — TIXAGEVIMAB (PART OF EVUSHELD) INJECTION
300.0000 mg | Freq: Once | INTRAMUSCULAR | Status: DC
  Filled 2021-04-25: qty 3

## 2021-04-25 MED ORDER — HEPARIN SOD (PORK) LOCK FLUSH 100 UNIT/ML IV SOLN
500.0000 [IU] | Freq: Once | INTRAVENOUS | Status: AC
  Administered 2021-04-25: 500 [IU]

## 2021-04-25 MED ORDER — CILGAVIMAB (PART OF EVUSHELD) INJECTION
300.0000 mg | Freq: Once | INTRAMUSCULAR | Status: DC
  Filled 2021-04-25: qty 3

## 2021-04-25 MED ORDER — SODIUM CHLORIDE 0.9 % IV SOLN: INTRAVENOUS | Status: DC

## 2021-04-25 MED ORDER — ACETAMINOPHEN 325 MG PO TABS
650.0000 mg | ORAL_TABLET | Freq: Once | ORAL | Status: AC
  Administered 2021-04-25: 650 mg via ORAL
  Filled 2021-04-25: qty 2

## 2021-04-25 MED ORDER — IMMUNE GLOBULIN (HUMAN) 10 GM/100ML IV SOLN
1.0000 g/kg | Freq: Once | INTRAVENOUS | Status: AC
  Administered 2021-04-25: 90 g via INTRAVENOUS
  Filled 2021-04-25: qty 800

## 2021-04-25 MED ORDER — SODIUM CHLORIDE 0.9% FLUSH
10.0000 mL | Freq: Once | INTRAVENOUS | Status: AC
  Administered 2021-04-25: 10 mL

## 2021-04-25 MED ORDER — TIXAGEVIMAB (PART OF EVUSHELD) INJECTION
300.0000 mg | Freq: Once | INTRAMUSCULAR | Status: AC
  Administered 2021-04-25: 300 mg via INTRAMUSCULAR
  Filled 2021-04-25: qty 3

## 2021-04-25 MED ORDER — DIPHENHYDRAMINE HCL 25 MG PO CAPS
25.0000 mg | ORAL_CAPSULE | Freq: Once | ORAL | Status: AC
  Administered 2021-04-25: 25 mg via ORAL
  Filled 2021-04-25 (×2): qty 1

## 2021-04-25 MED ORDER — CILGAVIMAB (PART OF EVUSHELD) INJECTION
300.0000 mg | Freq: Once | INTRAMUSCULAR | Status: AC
  Administered 2021-04-25: 300 mg via INTRAMUSCULAR

## 2021-04-25 NOTE — Addendum Note (Signed)
Addended by: Deatra Robinson B on: 04/25/2021 09:27 AM   Modules accepted: Orders

## 2021-04-25 NOTE — Patient Instructions (Signed)
Immune Globulin Injection What is this medication? IMMUNE GLOBULIN (im MUNE GLOB yoo lin) helps to prevent or reduce the severity of certain infections in patients who are at risk. This medicine is collected from the pooled blood of many donors. It is used to treat immune systemproblems, thrombocytopenia, and Kawasaki syndrome. This medicine may be used for other purposes; ask your health care provider orpharmacist if you have questions. COMMON BRAND NAME(S): ASCENIV, Baygam, BIVIGAM, Carimune, Carimune NF, cutaquig, Cuvitru, Flebogamma, Flebogamma DIF, GamaSTAN, GamaSTAN S/D, Gamimune N, Gammagard, Gammagard S/D, Gammaked, Gammaplex, Gammar-P IV, Gamunex, Gamunex-C, Hizentra, Iveegam, Iveegam EN, Octagam, Panglobulin, Panglobulin NF, panzyga, Polygam S/D, Privigen, Sandoglobulin, Venoglobulin-S, Vigam,Vivaglobulin, Xembify What should I tell my care team before I take this medication? They need to know if you have any of these conditions: diabetes extremely low or no immune antibodies in the blood heart disease history of blood clots hyperprolinemia infection in the blood, sepsis kidney disease recently received or scheduled to receive a vaccination an unusual or allergic reaction to human immune globulin, albumin, maltose, sucrose, other medicines, foods, dyes, or preservatives pregnant or trying to get pregnant breast-feeding How should I use this medication? This medicine is for injection into a muscle or infusion into a vein or skin. It is usually given by a health care professional in a hospital or clinicsetting. In rare cases, some brands of this medicine might be given at home. You will be taught how to give this medicine. Use exactly as directed. Take your medicineat regular intervals. Do not take your medicine more often than directed. Talk to your pediatrician regarding the use of this medicine in children. Whilethis drug may be prescribed for selected conditions, precautions do  apply. Overdosage: If you think you have taken too much of this medicine contact apoison control center or emergency room at once. NOTE: This medicine is only for you. Do not share this medicine with others. What if I miss a dose? It is important not to miss your dose. Call your doctor or health care professional if you are unable to keep an appointment. If you give yourself the medicine and you miss a dose, take it as soon as you can. If it is almost timefor your next dose, take only that dose. Do not take double or extra doses. What may interact with this medication? aspirin and aspirin-like medicines cisplatin cyclosporine medicines for infection like acyclovir, adefovir, amphotericin B, bacitracin, cidofovir, foscarnet, ganciclovir, gentamicin, pentamidine, vancomycin NSAIDS, medicines for pain and inflammation, like ibuprofen or naproxen pamidronate vaccines zoledronic acid This list may not describe all possible interactions. Give your health care provider a list of all the medicines, herbs, non-prescription drugs, or dietary supplements you use. Also tell them if you smoke, drink alcohol, or use illegaldrugs. Some items may interact with your medicine. What should I watch for while using this medication? Your condition will be monitored carefully while you are receiving thismedicine. This medicine is made from pooled blood donations of many different people. It may be possible to pass an infection in this medicine. However, the donors are screened for infections and all products are tested for HIV and hepatitis. The medicine is treated to kill most or all bacteria and viruses. Talk to yourdoctor about the risks and benefits of this medicine. Do not have vaccinations for at least 14 days before, or until at least 3months after receiving this medicine. What side effects may I notice from receiving this medication? Side effects that you should report to your doctor   or health care  professionalas soon as possible: allergic reactions like skin rash, itching or hives, swelling of the face, lips, or tongue blue colored lips or skin breathing problems chest pain or tightness fever signs and symptoms of aseptic meningitis such as stiff neck; sensitivity to light; headache; drowsiness; fever; nausea; vomiting; rash signs and symptoms of a blood clot such as chest pain; shortness of breath; pain, swelling, or warmth in the leg signs and symptoms of hemolytic anemia such as fast heartbeat; tiredness; dark yellow or brown urine; or yellowing of the eyes or skin signs and symptoms of kidney injury like trouble passing urine or change in the amount of urine sudden weight gain swelling of the ankles, feet, hands Side effects that usually do not require medical attention (report to yourdoctor or health care professional if they continue or are bothersome): diarrhea flushing headache increased sweating joint pain muscle cramps muscle pain nausea pain, redness, or irritation at site where injected tiredness This list may not describe all possible side effects. Call your doctor for medical advice about side effects. You may report side effects to FDA at1-800-FDA-1088. Where should I keep my medication? Keep out of the reach of children. This drug is usually given in a hospital or clinic and will not be stored athome. In rare cases, some brands of this medicine may be given at home. If you are using this medicine at home, you will be instructed on how to store thismedicine. Throw away any unused medicine after the expiration date on the label. NOTE: This sheet is a summary. It may not cover all possible information. If you have questions about this medicine, talk to your doctor, pharmacist, orhealth care provider.  2022 Elsevier/Gold Standard (2019-04-01 12:51:14)  

## 2021-04-27 ENCOUNTER — Telehealth: Payer: Self-pay | Admitting: Oncology

## 2021-04-27 NOTE — Telephone Encounter (Signed)
Scheduled appointment per 08/16 los. Patient is aware.

## 2021-05-09 ENCOUNTER — Other Ambulatory Visit (HOSPITAL_COMMUNITY): Payer: Self-pay

## 2021-05-30 DIAGNOSIS — E039 Hypothyroidism, unspecified: Secondary | ICD-10-CM | POA: Diagnosis not present

## 2021-05-30 DIAGNOSIS — E1149 Type 2 diabetes mellitus with other diabetic neurological complication: Secondary | ICD-10-CM | POA: Diagnosis not present

## 2021-05-30 DIAGNOSIS — M810 Age-related osteoporosis without current pathological fracture: Secondary | ICD-10-CM | POA: Diagnosis not present

## 2021-05-30 DIAGNOSIS — E782 Mixed hyperlipidemia: Secondary | ICD-10-CM | POA: Diagnosis not present

## 2021-06-06 DIAGNOSIS — Z1331 Encounter for screening for depression: Secondary | ICD-10-CM | POA: Diagnosis not present

## 2021-06-06 DIAGNOSIS — C9110 Chronic lymphocytic leukemia of B-cell type not having achieved remission: Secondary | ICD-10-CM | POA: Diagnosis not present

## 2021-06-06 DIAGNOSIS — E1149 Type 2 diabetes mellitus with other diabetic neurological complication: Secondary | ICD-10-CM | POA: Diagnosis not present

## 2021-06-06 DIAGNOSIS — E039 Hypothyroidism, unspecified: Secondary | ICD-10-CM | POA: Diagnosis not present

## 2021-06-06 DIAGNOSIS — I700 Atherosclerosis of aorta: Secondary | ICD-10-CM | POA: Diagnosis not present

## 2021-06-06 DIAGNOSIS — Z1339 Encounter for screening examination for other mental health and behavioral disorders: Secondary | ICD-10-CM | POA: Diagnosis not present

## 2021-06-06 DIAGNOSIS — D849 Immunodeficiency, unspecified: Secondary | ICD-10-CM | POA: Diagnosis not present

## 2021-06-06 DIAGNOSIS — Z23 Encounter for immunization: Secondary | ICD-10-CM | POA: Diagnosis not present

## 2021-06-06 DIAGNOSIS — M810 Age-related osteoporosis without current pathological fracture: Secondary | ICD-10-CM | POA: Diagnosis not present

## 2021-06-06 DIAGNOSIS — Z0000 Encounter for general adult medical examination without abnormal findings: Secondary | ICD-10-CM | POA: Diagnosis not present

## 2021-06-06 DIAGNOSIS — I10 Essential (primary) hypertension: Secondary | ICD-10-CM | POA: Diagnosis not present

## 2021-06-06 DIAGNOSIS — E782 Mixed hyperlipidemia: Secondary | ICD-10-CM | POA: Diagnosis not present

## 2021-06-14 ENCOUNTER — Other Ambulatory Visit: Payer: Self-pay | Admitting: Oncology

## 2021-06-14 ENCOUNTER — Other Ambulatory Visit (HOSPITAL_COMMUNITY): Payer: Self-pay

## 2021-06-14 DIAGNOSIS — C9110 Chronic lymphocytic leukemia of B-cell type not having achieved remission: Secondary | ICD-10-CM

## 2021-06-14 MED ORDER — VENETOCLAX 100 MG PO TABS
ORAL_TABLET | ORAL | 2 refills | Status: DC
  Filled 2021-06-22: qty 60, 30d supply, fill #0
  Filled 2021-07-20: qty 60, 30d supply, fill #1
  Filled 2021-08-17: qty 60, 30d supply, fill #2

## 2021-06-15 ENCOUNTER — Other Ambulatory Visit (HOSPITAL_COMMUNITY): Payer: Self-pay

## 2021-06-19 ENCOUNTER — Other Ambulatory Visit (HOSPITAL_COMMUNITY): Payer: Self-pay | Admitting: *Deleted

## 2021-06-20 ENCOUNTER — Other Ambulatory Visit: Payer: Self-pay

## 2021-06-20 ENCOUNTER — Ambulatory Visit (HOSPITAL_COMMUNITY)
Admission: RE | Admit: 2021-06-20 | Discharge: 2021-06-20 | Disposition: A | Discharge status: Home / Self Care | Payer: Medicare PPO | Source: Ambulatory Visit | Attending: Internal Medicine | Admitting: Internal Medicine

## 2021-06-20 DIAGNOSIS — M810 Age-related osteoporosis without current pathological fracture: Secondary | ICD-10-CM | POA: Insufficient documentation

## 2021-06-20 MED ORDER — ZOLEDRONIC ACID 5 MG/100ML IV SOLN: 5.0000 mg | Freq: Once | INTRAVENOUS | Status: AC

## 2021-06-20 MED ORDER — ZOLEDRONIC ACID 5 MG/100ML IV SOLN
INTRAVENOUS | Status: AC
  Administered 2021-06-20: 5 mg via INTRAVENOUS
  Filled 2021-06-20: qty 100

## 2021-06-22 ENCOUNTER — Encounter: Payer: Self-pay | Admitting: Oncology

## 2021-06-22 ENCOUNTER — Other Ambulatory Visit (HOSPITAL_COMMUNITY): Payer: Self-pay

## 2021-06-23 ENCOUNTER — Telehealth: Payer: Self-pay

## 2021-06-23 NOTE — Telephone Encounter (Signed)
Oral Oncology Patient Advocate Encounter  Was successful in securing patient a $8000 grant from [employer] to provide copayment coverage for Venclexta.  This will keep the out of pocket expense at $0.     [employer] ID: [id number]  I have spoken with the patient.   The billing information is as follows and has been shared with [place]: [id number] PCN: [id number] Member ID: [id number] Group ID: [id number] Dates of Eligibility: 06/02/21 through 06/01/22  Fund:  [place] Patient [place] Phone [phone] Fax [phone] 06/23/2021 10:04 AM

## 2021-07-04 ENCOUNTER — Other Ambulatory Visit (HOSPITAL_COMMUNITY): Payer: Self-pay

## 2021-07-19 ENCOUNTER — Inpatient Hospital Stay: Payer: Medicare PPO | Attending: Oncology

## 2021-07-19 ENCOUNTER — Encounter: Payer: Self-pay | Admitting: Adult Health

## 2021-07-19 ENCOUNTER — Other Ambulatory Visit: Payer: Self-pay

## 2021-07-19 ENCOUNTER — Inpatient Hospital Stay: Payer: Medicare PPO

## 2021-07-19 ENCOUNTER — Other Ambulatory Visit: Payer: Self-pay | Admitting: Adult Health

## 2021-07-19 ENCOUNTER — Inpatient Hospital Stay: Payer: Medicare PPO | Admitting: Adult Health

## 2021-07-19 VITALS — BP 112/44 | HR 54 | Temp 98.0°F | Resp 18

## 2021-07-19 VITALS — BP 141/63 | HR 67 | Temp 97.6°F | Resp 18 | Ht 66.0 in | Wt 192.0 lb

## 2021-07-19 DIAGNOSIS — D849 Immunodeficiency, unspecified: Secondary | ICD-10-CM

## 2021-07-19 DIAGNOSIS — C9110 Chronic lymphocytic leukemia of B-cell type not having achieved remission: Secondary | ICD-10-CM | POA: Diagnosis not present

## 2021-07-19 DIAGNOSIS — D801 Nonfamilial hypogammaglobulinemia: Secondary | ICD-10-CM

## 2021-07-19 DIAGNOSIS — T451X5A Adverse effect of antineoplastic and immunosuppressive drugs, initial encounter: Secondary | ICD-10-CM

## 2021-07-19 DIAGNOSIS — E876 Hypokalemia: Secondary | ICD-10-CM

## 2021-07-19 DIAGNOSIS — G620 Drug-induced polyneuropathy: Secondary | ICD-10-CM

## 2021-07-19 DIAGNOSIS — Z95828 Presence of other vascular implants and grafts: Secondary | ICD-10-CM

## 2021-07-19 DIAGNOSIS — D696 Thrombocytopenia, unspecified: Secondary | ICD-10-CM

## 2021-07-19 LAB — CBC WITH DIFFERENTIAL/PLATELET
Abs Immature Granulocytes: 0 10*3/uL (ref 0.00–0.07)
Basophils Absolute: 0 10*3/uL (ref 0.0–0.1)
Basophils Relative: 1 %
Eosinophils Absolute: 0 10*3/uL (ref 0.0–0.5)
Eosinophils Relative: 0 %
HCT: 41.6 % (ref 36.0–46.0)
Hemoglobin: 14.7 g/dL (ref 12.0–15.0)
Immature Granulocytes: 0 %
Lymphocytes Relative: 21 %
Lymphs Abs: 0.9 10*3/uL (ref 0.7–4.0)
MCH: 32.7 pg (ref 26.0–34.0)
MCHC: 35.3 g/dL (ref 30.0–36.0)
MCV: 92.7 fL (ref 80.0–100.0)
Monocytes Absolute: 0.5 10*3/uL (ref 0.1–1.0)
Monocytes Relative: 12 %
Neutro Abs: 2.8 10*3/uL (ref 1.7–7.7)
Neutrophils Relative %: 66 %
Platelets: 142 10*3/uL — ABNORMAL LOW (ref 150–400)
RBC: 4.49 MIL/uL (ref 3.87–5.11)
RDW: 13.2 % (ref 11.5–15.5)
WBC: 4.2 10*3/uL (ref 4.0–10.5)
nRBC: 0 % (ref 0.0–0.2)

## 2021-07-19 LAB — COMPREHENSIVE METABOLIC PANEL
ALT: 21 U/L (ref 0–44)
AST: 25 U/L (ref 15–41)
Albumin: 4 g/dL (ref 3.5–5.0)
Alkaline Phosphatase: 85 U/L (ref 38–126)
Anion gap: 11 (ref 5–15)
BUN: 20 mg/dL (ref 8–23)
CO2: 27 mmol/L (ref 22–32)
Calcium: 9.6 mg/dL (ref 8.9–10.3)
Chloride: 100 mmol/L (ref 98–111)
Creatinine, Ser: 1.55 mg/dL — ABNORMAL HIGH (ref 0.44–1.00)
GFR, Estimated: 36 mL/min — ABNORMAL LOW (ref >60)
Glucose, Bld: 230 mg/dL — ABNORMAL HIGH (ref 70–99)
Potassium: 2.7 mmol/L — CL (ref 3.5–5.1)
Sodium: 138 mmol/L (ref 135–145)
Total Bilirubin: 0.6 mg/dL (ref 0.3–1.2)
Total Protein: 6.5 g/dL (ref 6.5–8.1)

## 2021-07-19 LAB — LACTATE DEHYDROGENASE: LDH: 162 U/L (ref 98–192)

## 2021-07-19 LAB — IRON AND TIBC
Iron: 66 ug/dL (ref 41–142)
Saturation Ratios: 20 % — ABNORMAL LOW (ref 21–57)
TIBC: 324 ug/dL (ref 236–444)
UIBC: 258 ug/dL (ref 120–384)

## 2021-07-19 LAB — FERRITIN: Ferritin: 76 ng/mL (ref 11–307)

## 2021-07-19 MED ORDER — ACETAMINOPHEN 325 MG PO TABS
650.0000 mg | ORAL_TABLET | Freq: Once | ORAL | Status: AC
  Administered 2021-07-19: 650 mg via ORAL
  Filled 2021-07-19: qty 2

## 2021-07-19 MED ORDER — IMMUNE GLOBULIN (HUMAN) 10 GM/100ML IV SOLN
1.0000 g/kg | Freq: Once | INTRAVENOUS | Status: AC
  Administered 2021-07-19: 85 g via INTRAVENOUS
  Filled 2021-07-19: qty 800

## 2021-07-19 MED ORDER — SODIUM CHLORIDE 0.9% FLUSH: 10.0000 mL | Freq: Once | INTRAVENOUS | Status: DC

## 2021-07-19 MED ORDER — DIPHENHYDRAMINE HCL 25 MG PO CAPS
25.0000 mg | ORAL_CAPSULE | Freq: Once | ORAL | Status: AC
  Administered 2021-07-19: 25 mg via ORAL
  Filled 2021-07-19: qty 1

## 2021-07-19 MED ORDER — POTASSIUM CHLORIDE 10 MEQ/100ML IV SOLN
10.0000 meq | INTRAVENOUS | Status: AC
  Administered 2021-07-19 (×2): 10 meq via INTRAVENOUS
  Filled 2021-07-19 (×2): qty 100

## 2021-07-19 MED ORDER — HEPARIN SOD (PORK) LOCK FLUSH 100 UNIT/ML IV SOLN: 500.0000 [IU] | Freq: Once | INTRAVENOUS | Status: DC

## 2021-07-19 MED ORDER — SODIUM CHLORIDE 0.9% FLUSH: 10.0000 mL | INTRAVENOUS | Status: DC | PRN

## 2021-07-19 NOTE — Progress Notes (Signed)
Patient will receive KCL 37meq today IV.  She will stop her triamterene-hctz, and will increase her potsasium rich foods.  WE will repeat bmet on Monday.  She knows to monitor her BP in the interim and call me if it is elevated above 160.90   Wilber Bihari, NP 07/19/21 3:34 PM Medical Oncology and Hematology [facility] [place], [place] [address] Tel. [phone]    Fax. [phone]

## 2021-07-19 NOTE — Patient Instructions (Signed)
[facility]  Discharge Instructions: Thank you for choosing [facility] to provide your oncology and hematology care.   If you have a lab appointment with the Cancer Center, please go directly to the Cancer Center and check in at the registration area.   Wear comfortable clothing and clothing appropriate for easy access to any Portacath or PICC line.   We strive to give you quality time with your provider. You may need to reschedule your appointment if you arrive late (15 or more minutes).  Arriving late affects you and other patients whose appointments are after yours.  Also, if you miss three or more appointments without notifying the office, you may be dismissed from the clinic at the provider's discretion.      For prescription refill requests, have your pharmacy contact our office and allow 72 hours for refills to be completed.    Today you received the following chemotherapy and/or immunotherapy agents IVIG      To help prevent nausea and vomiting after your treatment, we encourage you to take your nausea medication as directed.  BELOW ARE SYMPTOMS THAT SHOULD BE REPORTED IMMEDIATELY: *FEVER GREATER THAN 100.4 F (38 C) OR HIGHER *CHILLS OR SWEATING *NAUSEA AND VOMITING THAT IS NOT CONTROLLED WITH YOUR NAUSEA MEDICATION *UNUSUAL SHORTNESS OF BREATH *UNUSUAL BRUISING OR BLEEDING *URINARY PROBLEMS (pain or burning when urinating, or frequent urination) *BOWEL PROBLEMS (unusual diarrhea, constipation, pain near the anus) TENDERNESS IN MOUTH AND THROAT WITH OR WITHOUT PRESENCE OF ULCERS (sore throat, sores in mouth, or a toothache) UNUSUAL RASH, SWELLING OR PAIN  UNUSUAL VAGINAL DISCHARGE OR ITCHING   Items with * indicate a potential emergency and should be followed up as soon as possible or go to the Emergency Department if any problems should occur.  Please show the CHEMOTHERAPY ALERT CARD or IMMUNOTHERAPY ALERT CARD at check-in to the  Emergency Department and triage nurse.  Should you have questions after your visit or need to cancel or reschedule your appointment, please contact [facility]  Dept: [phone]  and follow the prompts.  Office hours are 8:00 a.m. to 4:30 p.m. Monday - Friday. Please note that voicemails left after 4:00 p.m. may not be returned until the following business day.  We are closed weekends and major holidays. You have access to a nurse at all times for urgent questions. Please call the main number to the clinic Dept: [phone] and follow the prompts.   For any non-urgent questions, you may also contact your provider using MyChart. We now offer e-Visits for anyone 18 and older to request care online for non-urgent symptoms. For details visit [email or web address].   Also download the MyChart app! Go to the app store, search "MyChart", open the app, select , and log in with your MyChart username and password.  Due to Covid, a mask is required upon entering the hospital/clinic. If you do not have a mask, one will be given to you upon arrival. For doctor visits, patients may have 1 support person aged 18 or older with them. For treatment visits, patients cannot have anyone with them due to current Covid guidelines and our immunocompromised population.   

## 2021-07-19 NOTE — Progress Notes (Signed)
Per request of Wilber Bihari NP, LPN placed verbal order for pt to receive 2 runs of IV potassium 10 mEq today.

## 2021-07-19 NOTE — Progress Notes (Signed)
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   She continues on venetoclax 200mg  daily.  She tolerates this well and she continues to exercise regularly.  She recently saw her PCP and received her flu shot, she also received her COVID booster last month.  Ocie notes that she is working on intentionally losing some weight, as at her last PCP visit her blood sugar was elevated.  She is down 5 pounds since May.  Kalie is scheduled for a PET scan on 07/24/2021.  She receives IVIG prophylactically every 12 weeks.  She has a dose due today.  She has tolerated this with no difficulty.  Lab Results  Component Value Date   TMLYYTKP 546 (L) 01/31/2021   IGGSERUM 454 (L) 11/09/2020   IGGSERUM 364 (L) 08/11/2020   IGA 28 (L) 01/31/2021   IGA 27 (L) 11/09/2020   IGA 23 (L) 08/11/2020   IGMSERUM <5 (L) 01/31/2021   IGMSERUM <5 (L) 11/09/2020   IGMSERUM <5 (L) 08/11/2020     Since her last visit, she underwent bilateral screening mammography with tomography at [facility] on 11/17/2020 showing: breast density category B; no evidence of malignancy in either breast.  She receives Evusheld every 6 months, most recently in August of this year.   REVIEW OF SYSTEMS: Review of Systems   Constitutional:  Negative for appetite change, chills, fatigue, fever and unexpected weight change.  HENT:   Negative for hearing loss, lump/mass and trouble swallowing.   Eyes:  Negative for eye problems and icterus.  Respiratory:  Negative for chest tightness, cough and shortness of breath.   Cardiovascular:  Negative for chest pain, leg swelling and palpitations.  Gastrointestinal:  Negative for abdominal distention, abdominal pain, constipation, diarrhea, nausea and vomiting.  Endocrine: Negative for hot flashes.  Genitourinary:  Negative for difficulty urinating.   Musculoskeletal:  Negative for arthralgias.  Skin:  Negative for itching and rash.  Neurological:  Negative for dizziness, extremity weakness, headaches and numbness.  Hematological:  Negative for adenopathy. Does not bruise/bleed easily.  Psychiatric/Behavioral:  Negative for depression. The patient is not nervous/anxious.      COVID 19 VACCINATION STATUS: Status post Pfizer vaccine x2+ booster August 2021, with subsequent antibody testing negative (no response to vaccines); received 300mg Tixagevimab/300mg  Cilgavimab on 11/29/2020.    HISTORY OF PRESENT ILLNESS: From the original intake note:   [name] developed what she thought was "the flu" in December of 2002. She noted a large lymph node developing in her right anterior cervical area. 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    Her subsequent history is as detailed above.     PAST MEDICAL HISTORY: Past Medical History:  Diagnosis Date   Allergy    Arthritis    Asthma    Cancer ([place])    chronic lymphacytic leukemia  CLL (chronic lymphocytic leukemia) ([name])    Diabetes mellitus    PMH   Headache    migraine   Hyperlipidemia    Hypertension    Hypothyroidism    Lymphoma ([place])    Thyroid disease    hypothyroidism   Wears glasses      PAST SURGICAL HISTORY: Past Surgical  History:  Procedure Laterality Date   ABDOMINAL HYSTERECTOMY     AXILLARY LYMPH NODE BIOPSY Right 08/21/2018   Procedure: RIGHT AXILLARY LYMPH NODE BIOPSY ERAS PATHWAY;  Surgeon: [name], MD;  Location: [facility];  Service: General;  Laterality: Right;  LMA   BREAST EXCISIONAL BIOPSY Right    COLONOSCOPY     IR IMAGING GUIDED PORT INSERTION  11/10/2018   LYMPH NODE DISSECTION     STRABISMUS SURGERY     TONSILLECTOMY     TOTAL KNEE ARTHROPLASTY Right 12/03/2016   Procedure: RIGHT TOTAL KNEE ARTHROPLASTY;  Surgeon: [name], MD;  Location: [facility];  Service: Orthopedics;  Laterality: Right;     FAMILY HISTORY: Family History  Problem Relation Age of Onset   Liver cancer Mother    Heart disease Father    Breast cancer Sister 35   Breast cancer Maternal Grandmother    Colon cancer Neg Hx    Pancreatic cancer Neg Hx    Rectal cancer Neg Hx    Stomach cancer Neg Hx   The patient's father died at the age of 76 from a myocardial infarction. The patient's mother died at the age of 67 from primary liver carcinoma. The patient had no brothers. Her one sister, [name], has a history of breast cancer     GYNECOLOGIC HISTORY: Menarche at around age 11. The patient is GX P0. 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 She lives by herself. She had two cats, [name] and [place], but they are currently with her sister and Chayil thinks they would be more at home there. She attends a [employer], operational.               ADVANCED DIRECTIVES: Completed 02/03/2019. She has named her niece as her 55.     HEALTH MAINTENANCE: Social History   Socioeconomic History   Marital status: Single    Spouse name: Not on file   Number of children: Not on file   Years of education: Not on file   Highest education level: Not on file   Occupational History   Not on file  Tobacco Use   Smoking status: Never   Smokeless tobacco: Never  Vaping Use   Vaping Use: Never used  Substance and Sexual Activity   Alcohol use: Yes    Comment: occasional alcohol intake; once or twice monthly   Drug use: No   Sexual activity: Not on file  Other Topics Concern   Not on file  Social History Narrative   Not on file   Social Determinants of Health   Financial Resource Strain: Not on file  Food Insecurity: Not on file  Transportation Needs: Not on file  Physical Activity: Not on file  Stress: Not on file  Social Connections: Not on file  Intimate Partner Violence: Not on file                 Colonoscopy: 06/01/2014/[facility]  PAP:             Bone density:             Lipid panel:  228/114/50/155/ratio 4.6  On 10/01/2016  ALLERGIES: Allergies  Allergen Reactions   Iohexol Swelling and Rash   Compazine [Prochlorperazine Edisylate] Other (See Comments)    Mild confusion   Contrast Media  [Iodinated Diagnostic Agents]    Lactose Intolerance (Gi)     Diarrhea, gas bloating   Cephalexin Rash     CURRENT MEDICATIONS: Current Outpatient Medications  Medication Sig Dispense Refill   cholecalciferol (VITAMIN D3) 25 MCG (1000 UNIT) tablet Take 5,000 Units by mouth daily. OTC per PCP     acetaminophen (TYLENOL 8 HOUR ARTHRITIS PAIN) 650 MG CR tablet Take 650 mg by mouth every 8 (eight) hours as needed for pain.     rosuvastatin (CRESTOR) 10 MG tablet Take 10 mg by mouth at bedtime.  11   SYNTHROID 88 MCG tablet Take 88 mcg by mouth daily before breakfast.      triamterene-hydrochlorothiazide (DYAZIDE) 37.5-25 MG capsule Take 1 each (1 capsule total) by mouth daily. 90 capsule 4   valACYclovir (VALTREX) 1000 MG tablet TAKE 1 TABLET (1,000 MG TOTAL) BY MOUTH DAILY. 90 tablet 6   venetoclax (VENCLEXTA) 100 MG tablet TAKE 2 TABLETS BY MOUTH DAILY. TAKE WITH FOOD AND WATER AT APPROXIMATELY THE SAME TIME EACH DAY. 60  tablet 2   No current facility-administered medications for this visit.     OBJECTIVE:  Vitals:   07/19/21 0922  BP: (!) 141/63  Pulse: 67  Resp: 18  Temp: 97.6 F (36.4 C)  SpO2: 100%    Wt Readings from Last 3 Encounters:  07/19/21 192 lb (87.1 kg)  06/20/21 190 lb (86.2 kg)  04/25/21 195 lb 12.8 oz (88.8 kg)   Body mass index is 30.99 kg/m.    ECOG FS:1 - Symptomatic but completely ambulatory  GENERAL: Patient is a well appearing female in no acute distress HEENT:  Sclerae anicteric.  Oropharynx clear and moist. No ulcerations or evidence of oropharyngeal candidiasis. Neck is supple.  NODES:  No cervical, supraclavicular, or axillary lymphadenopathy palpated.  BREAST EXAM:  Deferred. LUNGS:  Clear to auscultation bilaterally.  No wheezes or rhonchi. HEART:  Regular rate and rhythm. No murmur appreciated. ABDOMEN:  Soft, nontender.  Positive, normoactive bowel sounds. No organomegaly palpated. MSK:  No focal spinal tenderness to palpation. Full range of motion bilaterally in the upper extremities. EXTREMITIES:  No peripheral edema.   SKIN:  Clear with no obvious rashes or skin changes. No nail dyscrasia. NEURO:  Nonfocal. Well oriented.  Appropriate affect.    LAB RESULTS: Lab Results  Component Value Date   WBC 4.2 07/19/2021   NEUTROABS 2.8 07/19/2021   HGB 14.7 07/19/2021   HCT 41.6 07/19/2021   MCV 92.7 07/19/2021   PLT 142 (L) 07/19/2021   CMP Latest Ref Rng & Units 04/25/2021 01/31/2021 11/09/2020  Glucose 70 - 99 mg/dL 179(H) 205(H) 195(H)  BUN 8 - 23 mg/dL $Remove'22 16 15  'wFVXQyI$ Creatinine 0.44 - 1.00 mg/dL 1.25(H) 1.16(H) 1.07(H)  Sodium 135 - 145 mmol/L 139 140 140  Potassium 3.5 - 5.1 mmol/L 3.1(L) 3.2(L) 3.5  Chloride 98 - 111 mmol/L 101 102 104  CO2 22 - 32 mmol/L $RemoveB'26 27 25  'FsLfCjMn$ Calcium 8.9 - 10.3 mg/dL 9.9 9.4 9.3  Total Protein 6.5 - 8.1 g/dL 6.8 6.4(L) 6.4(L)  Total Bilirubin 0.3 - 1.2 mg/dL 0.8 0.6  0.5  Alkaline Phos 38 - 126 U/L 94 74 91  AST 15 - 41 U/L $Remo'18  18 22  'FnxBK$ ALT 0 - 44 U/L $Remo'18 13 21    'yWlhH$ No results for input(s): LABCA2 in the last 72 hours.   Lab Results  Component Value Date   BSJGGEZM 629 (L) 01/31/2021   IGGSERUM 454 (L) 11/09/2020   IGGSERUM 364 (L) 08/11/2020   IGA 28 (L) 01/31/2021   IGA 27 (L) 11/09/2020   IGA 23 (L) 08/11/2020   IGMSERUM <5 (L) 01/31/2021   IGMSERUM <5 (L) 11/09/2020   IGMSERUM <5 (L) 08/11/2020     STUDIES: No results found.    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             (b) rituximab added to ibrutinib, first dose 08/27/2018             (c) hepatitis B studies 08/27/2018 negative             (f) ibrutinib/rituximab discontinued after 10/02/2018 dose w/o obvious response   (6) bendamustine/rituximab started 10/22/2018, discontinued after 1 cycle with rapid progression   (7) obinutuzumab/Gaziva started 11/11/2018  (a) second dose given 11/19/2018 together with chemotherapy  (b) third dose given 12/26/2018 and continuing every 3 weeks  (c) obinutuzumab discontinued after 09/09/2019  dose, with documentation of complete remission   (8) CH[O]P chemotherapy started 11/19/2018, completed 8 doses 05/19/2019             (a) echocardiogram on 11/07/2018 shows EF of 55-60%  (b) second cycle of CH[O]P postponed because of pandemic, given 12/26/2018, greatly reduced doses  (c) cycle 3 of CH[O]P/obinutuzumab given 02/03/2019 at increased doses  (d) cycle 4 and 5 of CH[O]P given on 6/16 and 7/7 at increased doses of $Remove'35mg'wtRhsUE$ /m2 and $Remov'550mg'PmIaFu$ /m2  (e) Repeat echo on 04/22/2019 shows an EF of 60-65%  (9) severe immunocompromise: Marked hypogammaglobulinemia  (a) starting March 2020 she received IVIG every 8 weeks as needed  (b) starting September 2021 she receives IVIG every 3 months  (c) received Evusheld 11/21/2020  (10) 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  (a) LMWH started 04/23/2019  (b) 75% dose reduction September 2020 secondary to bruising  (c) switched to rivaroxaban 09/10/2019  (d) 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   PLAN:   Renly is here today for follow-up of her CLL.  She continues on venetoclax with good tolerance.  She has no physical signs of recurrence.  She will undergo PET scan on November 15 and we will notify her of the results.  Abbagale continues to receive IVIG every 3 months.  She tolerates this well.  We are following her IgG levels.  In regards to COVID-19 prevention she receives every shield every 6 months.  This is due  again in 3 months.  She also received her booster last October.  We talked today about healthy diet and exercise.  We discussed carbohydrate limitation, which she is already doing.  Nunzio Cory met with Dr. Jana Hakim as he is retiring.  Dr. Quintella Reichert will be her new oncologist.  She will return in 3 months for labs, follow-up, and her next IVIG.  Total encounter time 30 minutes.Wilber Bihari, NP 07/19/21 10:02 AM Medical Oncology and Hematology [facility] [place], [place] [address] Tel. [phone]    Fax. [phone]    *Total Encounter Time as defined by the Centers for Medicare and Medicaid Services includes, in addition to the face-to-face time of a patient visit (documented in the note above) non-face-to-face time: obtaining and reviewing outside history, ordering and reviewing medications, tests or procedures, care coordination (communications with other health care professionals or caregivers) and documentation in the medical record.

## 2021-07-20 ENCOUNTER — Other Ambulatory Visit (HOSPITAL_COMMUNITY): Payer: Self-pay

## 2021-07-20 LAB — IGG, IGA, IGM
IgA: 26 mg/dL — ABNORMAL LOW (ref 87–352)
IgG (Immunoglobin G), Serum: 460 mg/dL — ABNORMAL LOW (ref 586–1602)
IgM (Immunoglobulin M), Srm: <5 mg/dL — ABNORMAL LOW (ref 26–217)

## 2021-07-24 ENCOUNTER — Other Ambulatory Visit (HOSPITAL_COMMUNITY): Payer: Self-pay

## 2021-07-25 ENCOUNTER — Inpatient Hospital Stay: Payer: Medicare PPO

## 2021-07-25 ENCOUNTER — Other Ambulatory Visit: Payer: Self-pay

## 2021-07-25 ENCOUNTER — Telehealth: Payer: Self-pay

## 2021-07-25 ENCOUNTER — Ambulatory Visit (HOSPITAL_COMMUNITY)
Admission: RE | Admit: 2021-07-25 | Discharge: 2021-07-25 | Disposition: A | Discharge status: Home / Self Care | Payer: Medicare PPO | Source: Ambulatory Visit | Attending: Oncology | Admitting: Oncology

## 2021-07-25 DIAGNOSIS — D849 Immunodeficiency, unspecified: Secondary | ICD-10-CM | POA: Diagnosis not present

## 2021-07-25 DIAGNOSIS — C9110 Chronic lymphocytic leukemia of B-cell type not having achieved remission: Secondary | ICD-10-CM | POA: Diagnosis not present

## 2021-07-25 DIAGNOSIS — D801 Nonfamilial hypogammaglobulinemia: Secondary | ICD-10-CM

## 2021-07-25 DIAGNOSIS — D696 Thrombocytopenia, unspecified: Secondary | ICD-10-CM | POA: Diagnosis not present

## 2021-07-25 DIAGNOSIS — G620 Drug-induced polyneuropathy: Secondary | ICD-10-CM

## 2021-07-25 DIAGNOSIS — T451X5A Adverse effect of antineoplastic and immunosuppressive drugs, initial encounter: Secondary | ICD-10-CM

## 2021-07-25 LAB — COMPREHENSIVE METABOLIC PANEL
ALT: 13 U/L (ref 0–44)
AST: 21 U/L (ref 15–41)
Albumin: 3.8 g/dL (ref 3.5–5.0)
Alkaline Phosphatase: 67 U/L (ref 38–126)
Anion gap: 9 (ref 5–15)
BUN: 20 mg/dL (ref 8–23)
CO2: 25 mmol/L (ref 22–32)
Calcium: 9.3 mg/dL (ref 8.9–10.3)
Chloride: 106 mmol/L (ref 98–111)
Creatinine, Ser: 1.41 mg/dL — ABNORMAL HIGH (ref 0.44–1.00)
GFR, Estimated: 40 mL/min — ABNORMAL LOW (ref >60)
Glucose, Bld: 148 mg/dL — ABNORMAL HIGH (ref 70–99)
Potassium: 3.4 mmol/L — ABNORMAL LOW (ref 3.5–5.1)
Sodium: 140 mmol/L (ref 135–145)
Total Bilirubin: 0.5 mg/dL (ref 0.3–1.2)
Total Protein: 7.6 g/dL (ref 6.5–8.1)

## 2021-07-25 LAB — CBC WITH DIFFERENTIAL/PLATELET
Abs Immature Granulocytes: 0 10*3/uL (ref 0.00–0.07)
Basophils Absolute: 0 10*3/uL (ref 0.0–0.1)
Basophils Relative: 1 %
Eosinophils Absolute: 0 10*3/uL (ref 0.0–0.5)
Eosinophils Relative: 1 %
HCT: 43.2 % (ref 36.0–46.0)
Hemoglobin: 14.5 g/dL (ref 12.0–15.0)
Immature Granulocytes: 0 %
Lymphocytes Relative: 18 %
Lymphs Abs: 0.8 10*3/uL (ref 0.7–4.0)
MCH: 31.9 pg (ref 26.0–34.0)
MCHC: 33.6 g/dL (ref 30.0–36.0)
MCV: 94.9 fL (ref 80.0–100.0)
Monocytes Absolute: 0.5 10*3/uL (ref 0.1–1.0)
Monocytes Relative: 11 %
Neutro Abs: 2.9 10*3/uL (ref 1.7–7.7)
Neutrophils Relative %: 69 %
Platelets: 152 10*3/uL (ref 150–400)
RBC: 4.55 MIL/uL (ref 3.87–5.11)
RDW: 13.4 % (ref 11.5–15.5)
WBC: 4.2 10*3/uL (ref 4.0–10.5)
nRBC: 0 % (ref 0.0–0.2)

## 2021-07-25 LAB — LACTATE DEHYDROGENASE: LDH: 167 U/L (ref 98–192)

## 2021-07-25 LAB — GLUCOSE, CAPILLARY: Glucose-Capillary: 160 mg/dL — ABNORMAL HIGH (ref 70–99)

## 2021-07-25 MED ORDER — FLUDEOXYGLUCOSE F - 18 (FDG) INJECTION
9.5000 | Freq: Once | INTRAVENOUS | Status: AC
  Administered 2021-07-25: 9.6 via INTRAVENOUS

## 2021-07-25 NOTE — Telephone Encounter (Signed)
Called and left VM sharing potassium levels have improved with diet change, please continue, per Mendel Ryder

## 2021-07-26 LAB — IGG, IGA, IGM
IgA: 26 mg/dL — ABNORMAL LOW (ref 87–352)
IgG (Immunoglobin G), Serum: 1704 mg/dL — ABNORMAL HIGH (ref 586–1602)
IgM (Immunoglobulin M), Srm: <5 mg/dL — ABNORMAL LOW (ref 26–217)

## 2021-08-17 ENCOUNTER — Other Ambulatory Visit (HOSPITAL_COMMUNITY): Payer: Self-pay

## 2021-08-23 ENCOUNTER — Other Ambulatory Visit (HOSPITAL_COMMUNITY): Payer: Self-pay

## 2021-09-19 ENCOUNTER — Other Ambulatory Visit: Payer: Self-pay | Admitting: Hematology and Oncology

## 2021-09-19 ENCOUNTER — Other Ambulatory Visit (HOSPITAL_COMMUNITY): Payer: Self-pay

## 2021-09-19 DIAGNOSIS — C9110 Chronic lymphocytic leukemia of B-cell type not having achieved remission: Secondary | ICD-10-CM

## 2021-09-19 MED ORDER — VENETOCLAX 100 MG PO TABS
ORAL_TABLET | ORAL | 2 refills | Status: DC
  Filled 2021-09-19: qty 60, 30d supply, fill #0
  Filled 2021-10-16: qty 60, 30d supply, fill #1
  Filled 2021-11-14: qty 60, 30d supply, fill #2

## 2021-09-21 ENCOUNTER — Other Ambulatory Visit (HOSPITAL_COMMUNITY): Payer: Self-pay

## 2021-10-16 ENCOUNTER — Other Ambulatory Visit (HOSPITAL_COMMUNITY): Payer: Self-pay

## 2021-10-19 ENCOUNTER — Other Ambulatory Visit: Payer: Self-pay | Admitting: *Deleted

## 2021-10-19 DIAGNOSIS — E876 Hypokalemia: Secondary | ICD-10-CM

## 2021-10-19 DIAGNOSIS — C9110 Chronic lymphocytic leukemia of B-cell type not having achieved remission: Secondary | ICD-10-CM

## 2021-10-19 NOTE — Progress Notes (Signed)
Patient Care Team: Ginger Organ., MD as PCP - General (Internal Medicine) [name], MD as Consulting Physician (Orthopedic Surgery) Maria [name], Maria Dad, MD (Inactive) as Consulting Physician (Oncology) [name], MD as Consulting Physician (General Surgery) [name] [name], NP as Nurse Practitioner (Hematology and Oncology)  DIAGNOSIS:    ICD-10-CM   1. 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       SUMMARY OF ONCOLOGIC HISTORY: Oncology History  CLL (chronic lymphocytic leukemia) ([place])  07/24/2012 Initial Diagnosis   CLL (chronic lymphocytic leukemia) ([place])   08/27/2018 - 10/02/2018 Chemotherapy   The patient had riTUXimab (RITUXAN) 100 mg in sodium chloride 0.9 % 250 mL (0.3846 mg/mL) infusion, 100 mg (100 % of original dose 100 mg), Intravenous,  Once, 4 of 4 cycles Dose modification: 100 mg (original dose 100 mg, Cycle 1, Reason: Provider Judgment), 375 mg/m2 (original dose 100 mg, Cycle 2, Reason: Provider Judgment) Administration: 100 mg (08/27/2018), 800 mg (08/28/2018), 800 mg (09/04/2018), 800 mg (09/11/2018) riTUXimab (RITUXAN) 800 mg in sodium chloride 0.9 % 170 mL infusion, 375 mg/m2 = 800 mg (100 % of original dose 375 mg/m2), Intravenous,  Once, 3 of 5 cycles Dose modification: 375 mg/m2 (original dose 375 mg/m2, Cycle 5) Administration: 800 mg (09/18/2018), 800 mg (09/25/2018), 800 mg (10/02/2018)   for chemotherapy treatment.     10/22/2018 - 10/23/2018 Chemotherapy   The patient had dexamethasone (DECADRON) 4 MG tablet, 8 mg, Oral, Daily, 1 of 1 cycle, Start date: 11/03/2018, End date: 11/03/2018 palonosetron (ALOXI) injection 0.25 mg, 0.25 mg, Intravenous,  Once, 1 of 4 cycles Administration: 0.25 mg (10/22/2018) riTUXimab (RITUXAN) 1,000 mg in sodium chloride 0.9 % 250 mL (2.8571 mg/mL) infusion, 500 mg/m2 = 1,000 mg (100 % of original dose 500 mg/m2), Intravenous,  Once, 1 of 1 cycle Dose modification: 500 mg/m2 (original dose 500  mg/m2, Cycle 1, Reason: Provider Judgment) bendamustine (BENDEKA) 125 mg in sodium chloride 0.9 % 50 mL (2.2727 mg/mL) chemo infusion, 70 mg/m2 = 125 mg (100 % of original dose 70 mg/m2), Intravenous,  Once, 1 of 4 cycles Dose modification: 70 mg/m2 (original dose 70 mg/m2, Cycle 1, Reason: Provider Judgment) Administration: 125 mg (10/22/2018), 125 mg (10/23/2018) riTUXimab (RITUXAN) 1,000 mg in sodium chloride 0.9 % 150 mL infusion, 500 mg/m2 = 1,000 mg (100 % of original dose 500 mg/m2), Intravenous,  Once, 1 of 1 cycle Dose modification: 500 mg/m2 (original dose 500 mg/m2, Cycle 1) Administration: 1,000 mg (10/22/2018) ofatumumab (ARZERRA) 300 mg in sodium chloride 0.9 % 985 mL chemo infusion, 300 mg (100 % of original dose 300 mg), Intravenous,  Once, 0 of 3 cycles Dose modification: 300 mg (original dose 300 mg, Cycle 2), 1,000 mg (original dose 1,000 mg, Cycle 2)   for chemotherapy treatment.     11/11/2018 - 09/09/2019 Chemotherapy   The patient had obinutuzumab (GAZYVA) 100 mg in sodium chloride 0.9 % 100 mL (0.9615 mg/mL) chemo infusion, 100 mg (100 % of original dose 100 mg), Intravenous, Once, 15 of 16 cycles Dose modification: 100 mg (original dose 100 mg, Cycle 1, Reason: Provider Judgment), 900 mg (original dose 900 mg, Cycle 2, Reason: Provider Judgment) Administration: 100 mg (11/11/2018), 900 mg (11/12/2018), 1,000 mg (11/19/2018), 1,000 mg (12/26/2018), 1,000 mg (02/03/2019), 1,000 mg (03/17/2019), 1,000 mg (04/07/2019), 1,000 mg (04/28/2019), 1,000 mg (02/25/2019), 1,000 mg (05/19/2019), 1,000 mg (06/12/2019), 1,000 mg (07/03/2019), 1,000 mg (07/24/2019), 1,000 mg (08/13/2019), 1,000 mg (09/09/2019)   for chemotherapy treatment.     11/19/2018 -  05/19/2019 Chemotherapy   The patient had DOXOrubicin (ADRIAMYCIN) chemo injection 100 mg, 50 mg/m2 = 100 mg, Intravenous,  Once, 8 of 8 cycles Dose modification: 25 mg/m2 (original dose 50 mg/m2, Cycle 2, Reason: Provider Judgment), 75 mg (original dose  50 mg/m2, Cycle 3, Reason: Provider Judgment), 40 mg/m2 (original dose 50 mg/m2, Cycle 3, Reason: Provider Judgment), 30 mg/m2 (original dose 50 mg/m2, Cycle 4, Reason: Provider Judgment, Comment: Per MD instruction), 35 mg/m2 (original dose 50 mg/m2, Cycle 4, Reason: Provider Judgment), 35 mg/m2 (original dose 50 mg/m2, Cycle 5, Reason: Provider Judgment), 35 mg/m2 (original dose 50 mg/m2, Cycle 6, Reason: Provider Judgment), 35 mg/m2 (original dose 50 mg/m2, Cycle 7, Reason: Provider Judgment) Administration: 100 mg (11/19/2018), 50 mg (12/26/2018), 60 mg (02/03/2019), 70 mg (02/24/2019), 70 mg (03/17/2019), 70 mg (04/07/2019), 70 mg (04/28/2019), 70 mg (05/19/2019) palonosetron (ALOXI) injection 0.25 mg, 0.25 mg, Intravenous,  Once, 8 of 8 cycles Administration: 0.25 mg (11/19/2018), 0.25 mg (12/26/2018), 0.25 mg (02/03/2019), 0.25 mg (02/24/2019), 0.25 mg (03/17/2019), 0.25 mg (04/07/2019), 0.25 mg (04/28/2019), 0.25 mg (05/19/2019) pegfilgrastim (NEULASTA) injection 6 mg, 6 mg, Subcutaneous, Once, 1 of 1 cycle Administration: 6 mg (11/21/2018) pegfilgrastim (NEULASTA ONPRO KIT) injection 6 mg, 6 mg, Subcutaneous, Once, 7 of 7 cycles Administration: 6 mg (12/26/2018), 6 mg (02/03/2019), 6 mg (02/24/2019), 6 mg (03/17/2019), 6 mg (04/07/2019), 6 mg (04/28/2019), 6 mg (05/19/2019) cyclophosphamide (CYTOXAN) 1,500 mg in sodium chloride 0.9 % 250 mL chemo infusion, 750 mg/m2 = 1,500 mg, Intravenous,  Once, 8 of 8 cycles Dose modification: 400 mg/m2 (original dose 750 mg/m2, Cycle 2, Reason: Provider Judgment), 500 mg/m2 (original dose 750 mg/m2, Cycle 3, Reason: Provider Judgment), 500 mg/m2 (original dose 750 mg/m2, Cycle 4, Reason: Provider Judgment, Comment: Per MD instruction), 550 mg/m2 (original dose 750 mg/m2, Cycle 4, Reason: Provider Judgment), 550 mg/m2 (original dose 750 mg/m2, Cycle 5, Reason: Provider Judgment), 550 mg/m2 (original dose 750 mg/m2, Cycle 6, Reason: Provider Judgment), 550 mg/m2 (original dose 750 mg/m2,  Cycle 7, Reason: Provider Judgment) Administration: 1,500 mg (11/19/2018), 800 mg (12/26/2018), 1,000 mg (02/03/2019), 1,100 mg (02/24/2019), 1,100 mg (03/17/2019), 1,100 mg (04/07/2019), 1,100 mg (04/28/2019), 1,100 mg (05/19/2019) fosaprepitant (EMEND) 150 mg, dexamethasone (DECADRON) 12 mg in sodium chloride 0.9 % 145 mL IVPB, , Intravenous,  Once, 8 of 8 cycles Administration:  (11/19/2018),  (12/26/2018),  (02/03/2019),  (02/24/2019),  (03/17/2019),  (04/07/2019),  (04/28/2019),  (05/19/2019)   for chemotherapy treatment.       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 PET on 07/25/2021 showed no evidence of lymphoma recurrence. She presents to the clinic today for follow-up.   ALLERGIES:  is allergic to iohexol, compazine [prochlorperazine edisylate], contrast media  [iodinated contrast media], lactose intolerance (gi), and cephalexin.  MEDICATIONS:  Current Outpatient Medications  Medication Sig Dispense Refill   acetaminophen (TYLENOL 8 HOUR ARTHRITIS PAIN) 650 MG CR tablet Take 650 mg by mouth every 8 (eight) hours as needed for pain.     cholecalciferol (VITAMIN D3) 25 MCG (1000 UNIT) tablet Take 5,000 Units by mouth daily. OTC per PCP     rosuvastatin (CRESTOR) 10 MG tablet Take 10 mg by mouth at bedtime.  11   SYNTHROID 88 MCG tablet Take 88 mcg by mouth daily before breakfast.      valACYclovir (VALTREX) 1000 MG tablet TAKE 1 TABLET (1,000 MG TOTAL) BY MOUTH DAILY. [place]  tablet 6   venetoclax (VENCLEXTA) 100 MG tablet TAKE 2 TABLETS BY MOUTH DAILY. TAKE WITH FOOD AND WATER AT APPROXIMATELY THE SAME TIME EACH DAY. 60 tablet 2   No current facility-administered medications for this visit.    PHYSICAL EXAMINATION: ECOG PERFORMANCE STATUS: 1 - Symptomatic but completely ambulatory  Vitals:   10/20/21 1016  BP: (!)  142/61  Pulse: 61  Resp: 18  Temp: 97.8 F (36.6 C)  SpO2: 100%   Filed Weights   10/20/21 1016  Weight: 196 lb 3.2 oz (89 kg)      LABORATORY DATA:  I have reviewed the data as listed CMP Latest Ref Rng & Units 07/25/2021 07/19/2021 04/25/2021  Glucose 70 - 99 mg/dL 148(H) 230(H) 179(H)  BUN 8 - 23 mg/dL _0 Creatinine 0.44 - 1.00 mg/dL 1.41(H) 1.55(H) 1.25(H)  Sodium 135 - 145 mmol/L 140 138 139  Potassium 3.5 - 5.1 mmol/L 3.4(L) 2.7(LL) 3.1(L)  Chloride 98 - 111 mmol/L 106 100 101  CO2 22 - 32 mmol/L _1 Calcium 8.9 - 10.3 mg/dL 9.3 9.6 9.9  Total Protein 6.5 - 8.1 g/dL 7.6 6.5 6.8  Total Bilirubin 0.3 - 1.2 mg/dL 0.5 0.6 0.8  Alkaline Phos 38 - 126 U/L 67 85 94  AST 15 - 41 U/L _2 ALT 0 - 44 U/L _3 Lab Results  Component Value Date   WBC 4.2 07/25/2021   HGB 14.5 07/25/2021   HCT 43.2 07/25/2021   MCV 94.9 07/25/2021   PLT 152 07/25/2021   NEUTROABS 2.9 07/25/2021    ASSESSMENT & PLAN:  CLL (chronic lymphocytic leukemia) ([place]) history of chronic lymphoid leukemia initially diagnosed in January 2003,    (1) treated in 2005 with cyclophosphamide, vincristine, prednisone and Rituxan   (2) treated next in 2008 with cyclophosphamide, fludarabine and rituximab, last dose November of 2008   (3) status post right axillary lymph node biopsy 07/18/2012 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              (b) rituximab added to ibrutinib, first dose 08/27/2018             (c) hepatitis B studies 08/27/2018 negative             (f) ibrutinib/rituximab discontinued after 10/02/2018 dose w/o obvious response   (6) bendamustine/rituximab started 10/22/2018, discontinued after 1 cycle with rapid progression   (7) obinutuzumab/Gaziva started 11/11/2018             (a) second dose given 11/19/2018 together with chemotherapy             (b) third dose given 12/26/2018 and continuing every 3 weeks             (c) obinutuzumab discontinued after 09/09/2019 dose,  with documentation of complete remission   (8) CH[O]P chemotherapy started 11/19/2018, completed 8 doses 05/19/2019             (a) echocardiogram on 11/07/2018 shows EF of 55-60%             (b) second cycle of CH[O]P postponed because of pandemic, given 12/26/2018, greatly reduced doses             (c) cycle 3 of CH[O]P/obinutuzumab given 02/03/2019 at increased doses             (d) cycle 4 and 5 of CH[O]P given on 6/16 and 7/7 at increased doses of 21m/m2 and 5575mm2             (e) Repeat echo on 04/22/2019 shows an EF of 60-65%   (9) severe immunocompromise: Marked hypogammaglobulinemia             (a) starting March 2020 she received IVIG every 8 weeks as needed             (b) starting September 2021 she receives IVIG every 3 months             (c) received Evusheld 11/21/2020   (10) 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 -------------------------------------------------------------------------------------------------- Clinically there is no evidence  of progression of disease Labs have been reviewed and they appear to be in good shape. She is tolerating venetoclax extremely well. She will receive IVIG today.  She gets IVIG every 3 months.  We discussed about switching her oncologist to Dr. KaIrene Limbond she is agreeable.  I will request her appointment in 3 months to be with Dr.Kale    No orders of the defined types were placed in this encounter.  The patient has a good understanding of the overall plan. she agrees with it. she will call with any problems that may develop before the next visit here.  Total time spent: 45 mins including face to face time and time spent for reviewing the chart, planning, charting and coordination of care  Maria EisenmengerMD, MPH 10/20/2021  I, Maria [name] acting as scribe for Dr. ViNicholas [name] I have reviewed the above documentation for accuracy and completeness, and I agree with the above.

## 2021-10-19 NOTE — Assessment & Plan Note (Signed)
history of chronic lymphoid leukemia initially diagnosed in January 2003,   (1) treated in 2005 with cyclophosphamide, vincristine, prednisone and Rituxan  (2) treated next in 2008 with cyclophosphamide, fludarabine and rituximab, last dose November of 2008  (3) status post right axillary lymph node biopsy 07/18/2012 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 (b) rituximabadded to ibrutinib, first dose 08/27/2018 (c) hepatitis B studies 08/27/2018 negative (f) ibrutinib/rituximab discontinued after 10/02/2018 dose w/o obvious response  (6) bendamustine/rituximabstarted 10/22/2018, discontinued after 1 cycle with rapid progression  (7) obinutuzumab/Gazivastarted 11/11/2018             (a) second dose given 11/19/2018 together with chemotherapy             (b) third dose given 12/26/2018 and continuing every 3 weeks             (c) obinutuzumab discontinued after 09/09/2019 dose, with documentation of complete remission  (8) CH[O]Pchemotherapy started 11/19/2018, completed 8 doses 05/19/2019 (a) echocardiogram on 11/07/2018 shows EF of 55-60%             (b) second cycle of CH[O]P postponed because of pandemic, given 12/26/2018, greatly reduced doses             (c) cycle 3 of CH[O]P/obinutuzumab given 02/03/2019 at increased doses             (d) cycle 4 and 5 of CH[O]P given on 6/16 and 7/7  at increased doses of 35mg /m2 and 550mg /m2             (e) Repeat echo on 04/22/2019 shows an EF of 60-65%  (9) severe immunocompromise: Marked hypogammaglobulinemia             (a) starting March 2020 she received IVIG every 8 weeks as needed             (b) starting September 2021 she receives IVIG every 3 months             (c) received Evusheld 11/21/2020  (10) 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             (a) LMWH started 04/23/2019             (b) 75% dose reduction September 2020 secondary to bruising             (c) switched to rivaroxaban 09/10/2019             (d) 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

## 2021-10-20 ENCOUNTER — Other Ambulatory Visit: Payer: Self-pay

## 2021-10-20 ENCOUNTER — Inpatient Hospital Stay: Payer: Medicare PPO

## 2021-10-20 ENCOUNTER — Inpatient Hospital Stay: Payer: Medicare PPO | Attending: Oncology

## 2021-10-20 ENCOUNTER — Inpatient Hospital Stay: Payer: Medicare PPO | Admitting: Hematology and Oncology

## 2021-10-20 ENCOUNTER — Other Ambulatory Visit (HOSPITAL_COMMUNITY): Payer: Self-pay

## 2021-10-20 VITALS — BP 139/67 | HR 57 | Temp 98.5°F | Resp 18

## 2021-10-20 DIAGNOSIS — Z95828 Presence of other vascular implants and grafts: Secondary | ICD-10-CM

## 2021-10-20 DIAGNOSIS — C9110 Chronic lymphocytic leukemia of B-cell type not having achieved remission: Secondary | ICD-10-CM

## 2021-10-20 DIAGNOSIS — D801 Nonfamilial hypogammaglobulinemia: Secondary | ICD-10-CM

## 2021-10-20 DIAGNOSIS — D696 Thrombocytopenia, unspecified: Secondary | ICD-10-CM

## 2021-10-20 DIAGNOSIS — D759 Disease of blood and blood-forming organs, unspecified: Secondary | ICD-10-CM | POA: Diagnosis not present

## 2021-10-20 DIAGNOSIS — G620 Drug-induced polyneuropathy: Secondary | ICD-10-CM

## 2021-10-20 DIAGNOSIS — D849 Immunodeficiency, unspecified: Secondary | ICD-10-CM

## 2021-10-20 DIAGNOSIS — T451X5A Adverse effect of antineoplastic and immunosuppressive drugs, initial encounter: Secondary | ICD-10-CM

## 2021-10-20 LAB — IRON AND IRON BINDING CAPACITY (CC-WL,HP ONLY)
Iron: 77 ug/dL (ref 28–170)
Saturation Ratios: 21 % (ref 10.4–31.8)
TIBC: 375 ug/dL (ref 250–450)
UIBC: 298 ug/dL (ref 148–442)

## 2021-10-20 LAB — CBC WITH DIFFERENTIAL/PLATELET
Abs Immature Granulocytes: 0.01 10*3/uL (ref 0.00–0.07)
Basophils Absolute: 0 10*3/uL (ref 0.0–0.1)
Basophils Relative: 1 %
Eosinophils Absolute: 0 10*3/uL (ref 0.0–0.5)
Eosinophils Relative: 1 %
HCT: 41.6 % (ref 36.0–46.0)
Hemoglobin: 13.6 g/dL (ref 12.0–15.0)
Immature Granulocytes: 0 %
Lymphocytes Relative: 29 %
Lymphs Abs: 1.2 10*3/uL (ref 0.7–4.0)
MCH: 31.1 pg (ref 26.0–34.0)
MCHC: 32.7 g/dL (ref 30.0–36.0)
MCV: 95 fL (ref 80.0–100.0)
Monocytes Absolute: 0.3 10*3/uL (ref 0.1–1.0)
Monocytes Relative: 8 %
Neutro Abs: 2.5 10*3/uL (ref 1.7–7.7)
Neutrophils Relative %: 61 %
Platelets: 141 10*3/uL — ABNORMAL LOW (ref 150–400)
RBC: 4.38 MIL/uL (ref 3.87–5.11)
RDW: 13.4 % (ref 11.5–15.5)
WBC: 4.1 10*3/uL (ref 4.0–10.5)
nRBC: 0 % (ref 0.0–0.2)

## 2021-10-20 LAB — COMPREHENSIVE METABOLIC PANEL
ALT: 24 U/L (ref 0–44)
AST: 27 U/L (ref 15–41)
Albumin: 4.2 g/dL (ref 3.5–5.0)
Alkaline Phosphatase: 77 U/L (ref 38–126)
Anion gap: 5 (ref 5–15)
BUN: 17 mg/dL (ref 8–23)
CO2: 27 mmol/L (ref 22–32)
Calcium: 9.5 mg/dL (ref 8.9–10.3)
Chloride: 108 mmol/L (ref 98–111)
Creatinine, Ser: 0.93 mg/dL (ref 0.44–1.00)
GFR, Estimated: >60 mL/min (ref >60)
Glucose, Bld: 144 mg/dL — ABNORMAL HIGH (ref 70–99)
Potassium: 4 mmol/L (ref 3.5–5.1)
Sodium: 140 mmol/L (ref 135–145)
Total Bilirubin: 0.5 mg/dL (ref 0.3–1.2)
Total Protein: 6.2 g/dL — ABNORMAL LOW (ref 6.5–8.1)

## 2021-10-20 LAB — FERRITIN: Ferritin: 46 ng/mL (ref 11–307)

## 2021-10-20 LAB — LACTATE DEHYDROGENASE: LDH: 160 U/L (ref 98–192)

## 2021-10-20 MED ORDER — SODIUM CHLORIDE 0.9% FLUSH
10.0000 mL | Freq: Once | INTRAVENOUS | Status: AC
  Administered 2021-10-20: 10 mL

## 2021-10-20 MED ORDER — HEPARIN SOD (PORK) LOCK FLUSH 100 UNIT/ML IV SOLN
500.0000 [IU] | Freq: Once | INTRAVENOUS | Status: AC
  Administered 2021-10-20: 500 [IU]

## 2021-10-20 MED ORDER — DEXTROSE 5 % IV SOLN: INTRAVENOUS | Status: DC

## 2021-10-20 MED ORDER — DIPHENHYDRAMINE HCL 25 MG PO CAPS
25.0000 mg | ORAL_CAPSULE | Freq: Once | ORAL | Status: AC
  Administered 2021-10-20: 25 mg via ORAL
  Filled 2021-10-20: qty 1

## 2021-10-20 MED ORDER — IMMUNE GLOBULIN (HUMAN) 10 GM/100ML IV SOLN
1.0000 g/kg | Freq: Once | INTRAVENOUS | Status: AC
  Administered 2021-10-20: 90 g via INTRAVENOUS
  Filled 2021-10-20: qty 900

## 2021-10-20 MED ORDER — ACETAMINOPHEN 325 MG PO TABS
650.0000 mg | ORAL_TABLET | Freq: Once | ORAL | Status: AC
  Administered 2021-10-20: 650 mg via ORAL
  Filled 2021-10-20: qty 2

## 2021-10-20 NOTE — Progress Notes (Signed)
Pt observed for 30 minutes post IVIG infusion. VSS. Pt ambulatory without complaints at time of discharge.

## 2021-10-20 NOTE — Patient Instructions (Signed)

## 2021-10-21 LAB — IGG, IGA, IGM
IgA: 23 mg/dL — ABNORMAL LOW (ref 87–352)
IgG (Immunoglobin G), Serum: 428 mg/dL — ABNORMAL LOW (ref 586–1602)
IgM (Immunoglobulin M), Srm: <5 mg/dL — ABNORMAL LOW (ref 26–217)

## 2021-10-23 ENCOUNTER — Telehealth: Payer: Self-pay | Admitting: Hematology and Oncology

## 2021-10-23 NOTE — Telephone Encounter (Signed)
Scheduled appointment per 2/10 los. Left message. Patient will be mailed an updated calendar.

## 2021-11-14 ENCOUNTER — Other Ambulatory Visit (HOSPITAL_COMMUNITY): Payer: Self-pay

## 2021-11-17 ENCOUNTER — Telehealth: Payer: Self-pay | Admitting: Hematology

## 2021-11-17 NOTE — Telephone Encounter (Signed)
Rescheduled upcoming appointment due to provider's schedule. Patient is aware of changes. 

## 2021-11-20 ENCOUNTER — Other Ambulatory Visit (HOSPITAL_COMMUNITY): Payer: Self-pay

## 2021-12-08 ENCOUNTER — Other Ambulatory Visit (HOSPITAL_COMMUNITY): Payer: Self-pay

## 2021-12-11 ENCOUNTER — Other Ambulatory Visit (HOSPITAL_COMMUNITY): Payer: Self-pay

## 2021-12-12 ENCOUNTER — Other Ambulatory Visit: Payer: Self-pay | Admitting: Hematology and Oncology

## 2021-12-12 ENCOUNTER — Other Ambulatory Visit (HOSPITAL_COMMUNITY): Payer: Self-pay

## 2021-12-12 DIAGNOSIS — C9110 Chronic lymphocytic leukemia of B-cell type not having achieved remission: Secondary | ICD-10-CM

## 2021-12-12 MED ORDER — VENETOCLAX 100 MG PO TABS
ORAL_TABLET | ORAL | 2 refills | Status: DC
  Filled 2021-12-20: qty 60, 30d supply, fill #0
  Filled 2022-01-10: qty 60, 30d supply, fill #1
  Filled 2022-02-13: qty 60, 30d supply, fill #2

## 2021-12-13 ENCOUNTER — Other Ambulatory Visit (HOSPITAL_COMMUNITY): Payer: Self-pay

## 2021-12-20 ENCOUNTER — Other Ambulatory Visit (HOSPITAL_COMMUNITY): Payer: Self-pay

## 2022-01-10 ENCOUNTER — Other Ambulatory Visit (HOSPITAL_COMMUNITY): Payer: Self-pay

## 2022-01-18 ENCOUNTER — Other Ambulatory Visit: Payer: Self-pay

## 2022-01-18 ENCOUNTER — Other Ambulatory Visit (HOSPITAL_COMMUNITY): Payer: Self-pay

## 2022-01-18 DIAGNOSIS — C9110 Chronic lymphocytic leukemia of B-cell type not having achieved remission: Secondary | ICD-10-CM

## 2022-01-22 ENCOUNTER — Inpatient Hospital Stay: Payer: Medicare PPO

## 2022-01-22 ENCOUNTER — Inpatient Hospital Stay: Payer: Medicare PPO | Attending: Oncology | Admitting: Hematology

## 2022-01-22 ENCOUNTER — Ambulatory Visit: Payer: Medicare PPO | Admitting: Hematology

## 2022-01-22 ENCOUNTER — Other Ambulatory Visit: Payer: Medicare PPO

## 2022-01-22 ENCOUNTER — Other Ambulatory Visit: Payer: Self-pay | Admitting: *Deleted

## 2022-01-22 ENCOUNTER — Other Ambulatory Visit: Payer: Self-pay

## 2022-01-22 VITALS — BP 160/58 | HR 62 | Temp 97.3°F | Resp 18 | Wt 203.4 lb

## 2022-01-22 DIAGNOSIS — C9110 Chronic lymphocytic leukemia of B-cell type not having achieved remission: Secondary | ICD-10-CM | POA: Insufficient documentation

## 2022-01-22 DIAGNOSIS — D801 Nonfamilial hypogammaglobulinemia: Secondary | ICD-10-CM | POA: Diagnosis not present

## 2022-01-22 DIAGNOSIS — Z95828 Presence of other vascular implants and grafts: Secondary | ICD-10-CM

## 2022-01-22 LAB — CBC WITH DIFFERENTIAL (CANCER CENTER ONLY)
Abs Immature Granulocytes: 0.01 10*3/uL (ref 0.00–0.07)
Basophils Absolute: 0 10*3/uL (ref 0.0–0.1)
Basophils Relative: 1 %
Eosinophils Absolute: 0 10*3/uL (ref 0.0–0.5)
Eosinophils Relative: 1 %
HCT: 40.5 % (ref 36.0–46.0)
Hemoglobin: 13.7 g/dL (ref 12.0–15.0)
Immature Granulocytes: 0 %
Lymphocytes Relative: 27 %
Lymphs Abs: 1.1 10*3/uL (ref 0.7–4.0)
MCH: 31.6 pg (ref 26.0–34.0)
MCHC: 33.8 g/dL (ref 30.0–36.0)
MCV: 93.3 fL (ref 80.0–100.0)
Monocytes Absolute: 0.4 10*3/uL (ref 0.1–1.0)
Monocytes Relative: 10 %
Neutro Abs: 2.4 10*3/uL (ref 1.7–7.7)
Neutrophils Relative %: 61 %
Platelet Count: 146 10*3/uL — ABNORMAL LOW (ref 150–400)
RBC: 4.34 MIL/uL (ref 3.87–5.11)
RDW: 13.1 % (ref 11.5–15.5)
WBC Count: 4 10*3/uL (ref 4.0–10.5)
nRBC: 0 % (ref 0.0–0.2)

## 2022-01-22 LAB — IRON AND IRON BINDING CAPACITY (CC-WL,HP ONLY)
Iron: 60 ug/dL (ref 28–170)
Saturation Ratios: 16 % (ref 10.4–31.8)
TIBC: 379 ug/dL (ref 250–450)
UIBC: 319 ug/dL (ref 148–442)

## 2022-01-22 LAB — CMP (CANCER CENTER ONLY)
ALT: 13 U/L (ref 0–44)
AST: 16 U/L (ref 15–41)
Albumin: 4.2 g/dL (ref 3.5–5.0)
Alkaline Phosphatase: 49 U/L (ref 38–126)
Anion gap: 4 — ABNORMAL LOW (ref 5–15)
BUN: 16 mg/dL (ref 8–23)
CO2: 28 mmol/L (ref 22–32)
Calcium: 9.3 mg/dL (ref 8.9–10.3)
Chloride: 108 mmol/L (ref 98–111)
Creatinine: 0.94 mg/dL (ref 0.44–1.00)
GFR, Estimated: >60 mL/min (ref >60)
Glucose, Bld: 137 mg/dL — ABNORMAL HIGH (ref 70–99)
Potassium: 3.9 mmol/L (ref 3.5–5.1)
Sodium: 140 mmol/L (ref 135–145)
Total Bilirubin: 0.4 mg/dL (ref 0.3–1.2)
Total Protein: 6.1 g/dL — ABNORMAL LOW (ref 6.5–8.1)

## 2022-01-22 LAB — LACTATE DEHYDROGENASE: LDH: 127 U/L (ref 98–192)

## 2022-01-22 LAB — FERRITIN: Ferritin: 30 ng/mL (ref 11–307)

## 2022-01-22 MED ORDER — HEPARIN SOD (PORK) LOCK FLUSH 100 UNIT/ML IV SOLN
500.0000 [IU] | Freq: Once | INTRAVENOUS | Status: AC
  Administered 2022-01-22: 500 [IU]

## 2022-01-22 MED ORDER — HEPARIN SOD (PORK) LOCK FLUSH 100 UNIT/ML IV SOLN
500.0000 [IU] | Freq: Once | INTRAVENOUS | Status: AC
  Administered 2022-01-22: 500 [IU] via INTRAVENOUS

## 2022-01-22 MED ORDER — SODIUM CHLORIDE 0.9% FLUSH
10.0000 mL | INTRAVENOUS | Status: DC | PRN
  Administered 2022-01-22: 10 mL via INTRAVENOUS

## 2022-01-22 MED ORDER — SODIUM CHLORIDE 0.9% FLUSH
10.0000 mL | Freq: Once | INTRAVENOUS | Status: AC
  Administered 2022-01-22: 10 mL

## 2022-01-22 NOTE — Progress Notes (Signed)
HEMATOLOGY/ONCOLOGY CLINIC NOTE   Date of Service: 01/22/2022  Patient Care Team: Ginger Organ., MD as PCP - General (Internal Medicine) [name], MD as Consulting Physician (Orthopedic Surgery) [name], [name] Dad, MD (Inactive) as Consulting Physician (Oncology) [name], MD as Consulting Physician (General Surgery) [name], [name], NP as Nurse Practitioner (Hematology and Oncology)  DIAGNOSIS:  F/u for continued evaluation and mx of SLL   SUMMARY OF ONCOLOGIC HISTORY: Oncology History  CLL (chronic lymphocytic leukemia) ([place])  07/24/2012 Initial Diagnosis   CLL (chronic lymphocytic leukemia) ([place])    08/27/2018 - 10/02/2018 Chemotherapy   The patient had riTUXimab (RITUXAN) 100 mg in sodium chloride 0.9 % 250 mL (0.3846 mg/mL) infusion, 100 mg (100 % of original dose 100 mg), Intravenous,  Once, 4 of 4 cycles Dose modification: 100 mg (original dose 100 mg, Cycle 1, Reason: Provider Judgment), 375 mg/m2 (original dose 100 mg, Cycle 2, Reason: Provider Judgment) Administration: 100 mg (08/27/2018), 800 mg (08/28/2018), 800 mg (09/04/2018), 800 mg (09/11/2018) riTUXimab (RITUXAN) 800 mg in sodium chloride 0.9 % 170 mL infusion, 375 mg/m2 = 800 mg (100 % of original dose 375 mg/m2), Intravenous,  Once, 3 of 5 cycles Dose modification: 375 mg/m2 (original dose 375 mg/m2, Cycle 5) Administration: 800 mg (09/18/2018), 800 mg (09/25/2018), 800 mg (10/02/2018)   for chemotherapy treatment.     10/22/2018 - 10/23/2018 Chemotherapy   The patient had dexamethasone (DECADRON) 4 MG tablet, 8 mg, Oral, Daily, 1 of 1 cycle, Start date: 11/03/2018, End date: 11/03/2018 palonosetron (ALOXI) injection 0.25 mg, 0.25 mg, Intravenous,  Once, 1 of 4 cycles Administration: 0.25 mg (10/22/2018) riTUXimab (RITUXAN) 1,000 mg in sodium chloride 0.9 % 250 mL (2.8571 mg/mL) infusion, 500 mg/m2 = 1,000 mg (100 % of original dose 500 mg/m2), Intravenous,  Once, 1 of 1 cycle Dose  modification: 500 mg/m2 (original dose 500 mg/m2, Cycle 1, Reason: Provider Judgment) bendamustine (BENDEKA) 125 mg in sodium chloride 0.9 % 50 mL (2.2727 mg/mL) chemo infusion, 70 mg/m2 = 125 mg (100 % of original dose 70 mg/m2), Intravenous,  Once, 1 of 4 cycles Dose modification: 70 mg/m2 (original dose 70 mg/m2, Cycle 1, Reason: Provider Judgment) Administration: 125 mg (10/22/2018), 125 mg (10/23/2018) riTUXimab (RITUXAN) 1,000 mg in sodium chloride 0.9 % 150 mL infusion, 500 mg/m2 = 1,000 mg (100 % of original dose 500 mg/m2), Intravenous,  Once, 1 of 1 cycle Dose modification: 500 mg/m2 (original dose 500 mg/m2, Cycle 1) Administration: 1,000 mg (10/22/2018) ofatumumab (ARZERRA) 300 mg in sodium chloride 0.9 % 985 mL chemo infusion, 300 mg (100 % of original dose 300 mg), Intravenous,  Once, 0 of 3 cycles Dose modification: 300 mg (original dose 300 mg, Cycle 2), 1,000 mg (original dose 1,000 mg, Cycle 2)   for chemotherapy treatment.     11/11/2018 - 09/09/2019 Chemotherapy   The patient had obinutuzumab (GAZYVA) 100 mg in sodium chloride 0.9 % 100 mL (0.9615 mg/mL) chemo infusion, 100 mg (100 % of original dose 100 mg), Intravenous, Once, 15 of 16 cycles Dose modification: 100 mg (original dose 100 mg, Cycle 1, Reason: Provider Judgment), 900 mg (original dose 900 mg, Cycle 2, Reason: Provider Judgment) Administration: 100 mg (11/11/2018), 900 mg (11/12/2018), 1,000 mg (11/19/2018), 1,000 mg (12/26/2018), 1,000 mg (02/03/2019), 1,000 mg (03/17/2019), 1,000 mg (04/07/2019), 1,000 mg (04/28/2019), 1,000 mg (02/25/2019), 1,000 mg (05/19/2019), 1,000 mg (06/12/2019), 1,000 mg (07/03/2019), 1,000 mg (07/24/2019), 1,000 mg (08/13/2019), 1,000 mg (09/09/2019)   for chemotherapy treatment.  11/19/2018 - 05/19/2019 Chemotherapy   The patient had DOXOrubicin (ADRIAMYCIN) chemo injection 100 mg, 50 mg/m2 = 100 mg, Intravenous,  Once, 8 of 8 cycles Dose modification: 25 mg/m2 (original dose 50 mg/m2, Cycle 2, Reason:  Provider Judgment), 75 mg (original dose 50 mg/m2, Cycle 3, Reason: Provider Judgment), 40 mg/m2 (original dose 50 mg/m2, Cycle 3, Reason: Provider Judgment), 30 mg/m2 (original dose 50 mg/m2, Cycle 4, Reason: Provider Judgment, Comment: Per MD instruction), 35 mg/m2 (original dose 50 mg/m2, Cycle 4, Reason: Provider Judgment), 35 mg/m2 (original dose 50 mg/m2, Cycle 5, Reason: Provider Judgment), 35 mg/m2 (original dose 50 mg/m2, Cycle 6, Reason: Provider Judgment), 35 mg/m2 (original dose 50 mg/m2, Cycle 7, Reason: Provider Judgment) Administration: 100 mg (11/19/2018), 50 mg (12/26/2018), 60 mg (02/03/2019), 70 mg (02/24/2019), 70 mg (03/17/2019), 70 mg (04/07/2019), 70 mg (04/28/2019), 70 mg (05/19/2019) palonosetron (ALOXI) injection 0.25 mg, 0.25 mg, Intravenous,  Once, 8 of 8 cycles Administration: 0.25 mg (11/19/2018), 0.25 mg (12/26/2018), 0.25 mg (02/03/2019), 0.25 mg (02/24/2019), 0.25 mg (03/17/2019), 0.25 mg (04/07/2019), 0.25 mg (04/28/2019), 0.25 mg (05/19/2019) pegfilgrastim (NEULASTA) injection 6 mg, 6 mg, Subcutaneous, Once, 1 of 1 cycle Administration: 6 mg (11/21/2018) pegfilgrastim (NEULASTA ONPRO KIT) injection 6 mg, 6 mg, Subcutaneous, Once, 7 of 7 cycles Administration: 6 mg (12/26/2018), 6 mg (02/03/2019), 6 mg (02/24/2019), 6 mg (03/17/2019), 6 mg (04/07/2019), 6 mg (04/28/2019), 6 mg (05/19/2019) cyclophosphamide (CYTOXAN) 1,500 mg in sodium chloride 0.9 % 250 mL chemo infusion, 750 mg/m2 = 1,500 mg, Intravenous,  Once, 8 of 8 cycles Dose modification: 400 mg/m2 (original dose 750 mg/m2, Cycle 2, Reason: Provider Judgment), 500 mg/m2 (original dose 750 mg/m2, Cycle 3, Reason: Provider Judgment), 500 mg/m2 (original dose 750 mg/m2, Cycle 4, Reason: Provider Judgment, Comment: Per MD instruction), 550 mg/m2 (original dose 750 mg/m2, Cycle 4, Reason: Provider Judgment), 550 mg/m2 (original dose 750 mg/m2, Cycle 5, Reason: Provider Judgment), 550 mg/m2 (original dose 750 mg/m2, Cycle 6, Reason: Provider Judgment),  550 mg/m2 (original dose 750 mg/m2, Cycle 7, Reason: Provider Judgment) Administration: 1,500 mg (11/19/2018), 800 mg (12/26/2018), 1,000 mg (02/03/2019), 1,100 mg (02/24/2019), 1,100 mg (03/17/2019), 1,100 mg (04/07/2019), 1,100 mg (04/28/2019), 1,100 mg (05/19/2019) fosaprepitant (EMEND) 150 mg, dexamethasone (DECADRON) 12 mg in sodium chloride 0.9 % 145 mL IVPB, , Intravenous,  Once, 8 of 8 cycles Administration:  (11/19/2018),  (12/26/2018),  (02/03/2019),  (02/24/2019),  (03/17/2019),  (04/07/2019),  (04/28/2019),  (05/19/2019)   for chemotherapy treatment.      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             (b) rituximab added to ibrutinib, first dose 08/27/2018             (c) hepatitis B studies 08/27/2018 negative             (f) ibrutinib/rituximab discontinued after 10/02/2018 dose w/o obvious response   (6) bendamustine/rituximab started 10/22/2018, discontinued after 1 cycle with rapid progression   (7) obinutuzumab/Gaziva started 11/11/2018             (a) second dose  given 11/19/2018 together with chemotherapy             (b) third dose given 12/26/2018 and continuing every 3 weeks             (c) obinutuzumab discontinued after 09/09/2019 dose, with documentation of complete remission   (8) CH[O]P chemotherapy started 11/19/2018, completed 8 doses 05/19/2019             (a) echocardiogram on 11/07/2018 shows EF of 55-60%             (b) second cycle of CH[O]P postponed because of pandemic, given 12/26/2018, greatly reduced doses             (c) cycle 3 of CH[O]P/obinutuzumab given 02/03/2019 at increased doses             (d) cycle 4 and 5 of CH[O]P given on 6/16 and 7/7 at increased doses of $Remove'35mg'FIZxZsu$ /m2 and $Remov'550mg'akwOfJ$ /m2             (e) Repeat echo on 04/22/2019 shows an EF of 60-65%   (9) severe immunocompromise: Marked hypogammaglobulinemia             (a) starting March 2020 she received IVIG every 8 weeks as needed             (b) starting September 2021 she receives IVIG every 3 months             (c) received Evusheld 11/21/2020   (10) 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 female Here today for evaluation and management of history of small lymphocytic lymphoma/chronic lymphocytic leukemia. She reports She is doing well with no new symptoms or concerns. She is transferring care from Dr Lindi Adie.  She notes that she has been exercising regularly.  We discussed that we will hold the IVIG at this time.  She reports she is tolerating the  Venetoclax well with no new prohibitive toxicities at this time.  She reports that she has recently been feeling cold.  No fever, chills, night sweats. No new lumps, bumps, or lesions/rashes. No abdominal pain or change in bowel habits. No new or unexpected weight loss. No SOB or chest pain. No infection issues. No other new or acute focal symptoms.  Available labs done today were reviewed with her in detail. CBC  WNL.  CMP unremarkable.  ALLERGIES:  is allergic to iohexol, compazine [prochlorperazine edisylate], contrast media  [iodinated contrast media], lactose intolerance (gi), and cephalexin.  MEDICATIONS:  Current Outpatient Medications  Medication Sig Dispense Refill   acetaminophen (TYLENOL 8 HOUR ARTHRITIS PAIN) 650 MG CR tablet Take 650 mg by mouth every 8 (eight) hours as needed for pain.     cholecalciferol (VITAMIN D3) 25 MCG (1000 UNIT) tablet Take 5,000 Units by mouth daily. OTC per PCP     rosuvastatin (CRESTOR) 10 MG tablet Take 10 mg by mouth at bedtime.  11   SYNTHROID 88 MCG tablet Take 88 mcg by mouth daily before breakfast.      valACYclovir (VALTREX) 1000 MG tablet TAKE 1 TABLET (1,000 MG TOTAL) BY MOUTH DAILY. 90 tablet 6   venetoclax (VENCLEXTA) 100 MG tablet TAKE 2 TABLETS BY MOUTH DAILY. TAKE WITH FOOD AND WATER AT APPROXIMATELY THE SAME TIME EACH DAY. 60 tablet 2   No current facility-administered medications for this visit.    PHYSICAL EXAMINATION: ECOG PERFORMANCE STATUS: 1 - Symptomatic but completely ambulatory  Vitals:   01/22/22 1108  BP: (!) 160/58  Pulse: 62  Resp: 18  Temp: (!) 97.3 F (36.3 C)  SpO2: 100%    Filed Weights   01/22/22 1108  Weight: 203 lb 6.4 oz (92.3 kg)    NAD GENERAL:alert, in no acute distress and comfortable SKIN: no acute rashes, no significant lesions EYES: conjunctiva are pink and non-injected, sclera anicteric NECK: supple, no JVD LYMPH:  no palpable lymphadenopathy in the cervical, axillary or inguinal  regions LUNGS: clear to auscultation b/l with normal respiratory effort HEART: regular rate & rhythm ABDOMEN:  normoactive bowel sounds , non tender, not distended. Extremity: mild pedal edema. Edema worse in right leg with some erythema. PSYCH: alert & oriented x 3 with fluent speech NEURO: no focal motor/sensory deficits  LABORATORY DATA:  I have reviewed the data as listed    Latest Ref Rng & Units 01/22/2022   10:20 AM 10/20/2021   10:08 AM 07/25/2021    7:30 AM  CMP  Glucose 70 - 99 mg/dL 137   144   148    BUN 8 - 23 mg/dL _0 Creatinine 0.44 - 1.00 mg/dL 0.94   0.93   1.41    Sodium 135 - 145 mmol/L 140   140   140    Potassium 3.5 - 5.1 mmol/L 3.9   4.0   3.4    Chloride 98 - 111 mmol/L 108   108   106    CO2 22 - 32 mmol/L _1 Calcium 8.9 - 10.3 mg/dL 9.3   9.5   9.3    Total Protein 6.5 - 8.1 g/dL 6.1   6.2   7.6    Total Bilirubin 0.3 - 1.2 mg/dL 0.4   0.5   0.5    Alkaline Phos 38 - 126 U/L 49   77   67    AST 15 - 41 U/L _2 ALT 0 - 44 U/L _3 .    Latest Ref Rng & Units 01/22/2022   10:20 AM 10/20/2021   10:08 AM 07/25/2021    7:30 AM  CBC  WBC 4.0 - 10.5 K/uL 4.0   4.1   4.2  Hemoglobin 12.0 - 15.0 g/dL 13.7   13.6   14.5    Hematocrit 36.0 - 46.0 % 40.5   41.6   43.2    Platelets 150 - 400 K/uL 146   141   152       ASSESSMENT & PLAN:   CLL (chronic lymphocytic leukemia) ([facility]) history of chronic lymphoid leukemia initially diagnosed in January 2003,    (1) treated in 2005 with cyclophosphamide, vincristine, prednisone and Rituxan   (2) treated next in 2008 with cyclophosphamide, fludarabine and rituximab, last dose November of 2008   (3) status post right axillary lymph node biopsy 07/18/2012 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             (b) rituximab added to ibrutinib, first dose 08/27/2018             (c) hepatitis B studies 08/27/2018 negative             (f) ibrutinib/rituximab discontinued after 10/02/2018 dose w/o obvious response   (6) bendamustine/rituximab started 10/22/2018, discontinued after 1 cycle with rapid progression   (7) obinutuzumab/Gaziva started 11/11/2018             (a) second dose given 11/19/2018 together with chemotherapy             (b) third dose given 12/26/2018 and continuing every 3 weeks             (c) obinutuzumab discontinued after 09/09/2019 dose, with documentation of complete remission   (8) CH[O]P chemotherapy started 11/19/2018, completed 8 doses 05/19/2019             (a) echocardiogram on 11/07/2018 shows EF of 55-60%             (b) second cycle of CH[O]P postponed because of pandemic, given 12/26/2018, greatly reduced doses             (c) cycle 3 of CH[O]P/obinutuzumab given 02/03/2019 at increased doses             (d) cycle 4 and 5 of CH[O]P given on 6/16 and 7/7 at increased doses of $Remove'35mg'mEbgCvN$ /m2 and $Remov'550mg'GUYdvH$ /m2             (e) Repeat echo on 04/22/2019 shows an EF of 60-65%   (9) severe immunocompromise: Marked hypogammaglobulinemia             (a) starting March 2020 she received IVIG every 8 weeks as needed             (b) starting September 2021 she receives IVIG every 3 months             (c) received Evusheld 11/21/2020   (10) 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           rivaroxaban stopped December 2021   (  11) started venetoclax ramp-up 06/11/2019,  progressed to 400 mg/d dose w/o event             (a) dose reduced to 200 mg daily 08/05/2019 secondary to cytopenias             (b) PET scan December 2021 shows small minimally hypermetabolic residual periportal lymph nodes but no additional adenopathy elsewhere.  PLAN HPI and her extensive oncologic history was confirmed and discussed with the patient as she is transferring her care to our service. -Labs done today were reviewed with her in detail. CBC WNL.  CMP unremarkable. Ferritin at 30 Iron saturation at 16%. LDH at 127. QIG Levels 457 -Clinically there is no evidence of progression of disease -She is tolerating venetoclax 238m po daily well with no prohibitive toxicities at this time. -We discussed that we will hold the IVIG at this time. -RTC with labs in 3 months  Orders Placed This Encounter  Procedures   IgG, IgA, IgM    Standing Status:   Future    Number of Occurrences:   1    Standing Expiration Date:   01/23/2023   FOLLOW UP RTC with Dr KIrene Limbowith labs in 3 months  The total time spent in the appointment was 40 minutes* .  All of the patient's questions were answered with apparent satisfaction. The patient knows to call the clinic with any problems, questions or concerns.   GSullivan LoneMD MS AAHIVMS SMississippi Valley Endoscopy CenterCPacific Endoscopy Center LLCHematology/Oncology Physician [facility] .*Total Encounter Time as defined by the Centers for Medicare and Medicaid Services includes, in addition to the face-to-face time of a patient visit (documented in the note above) non-face-to-face time: obtaining and reviewing outside history, ordering and reviewing medications, tests or procedures, care coordination (communications with other health care professionals or caregivers) and documentation in the medical record.  I, GMelene Muller am acting as scribe for Dr. GSullivan Lone MD.  .I have reviewed the above documentation for accuracy and completeness, and I agree with the above. .Brunetta GeneraMD

## 2022-01-23 LAB — IGG, IGA, IGM
IgA: 22 mg/dL — ABNORMAL LOW (ref 87–352)
IgG (Immunoglobin G), Serum: 457 mg/dL — ABNORMAL LOW (ref 586–1602)
IgM (Immunoglobulin M), Srm: <5 mg/dL — ABNORMAL LOW (ref 26–217)

## 2022-01-29 ENCOUNTER — Encounter: Payer: Self-pay | Admitting: Oncology

## 2022-01-30 ENCOUNTER — Encounter: Payer: Self-pay | Admitting: Intensive Care

## 2022-01-30 ENCOUNTER — Other Ambulatory Visit: Payer: Self-pay

## 2022-01-30 ENCOUNTER — Emergency Department: Payer: Medicare PPO

## 2022-01-30 ENCOUNTER — Emergency Department
Admission: EM | Admit: 2022-01-30 | Discharge: 2022-01-30 | Disposition: A | Discharge status: Home / Self Care | Payer: Medicare PPO | Attending: Emergency Medicine | Admitting: Emergency Medicine

## 2022-01-30 DIAGNOSIS — J45909 Unspecified asthma, uncomplicated: Secondary | ICD-10-CM | POA: Diagnosis not present

## 2022-01-30 DIAGNOSIS — E039 Hypothyroidism, unspecified: Secondary | ICD-10-CM | POA: Insufficient documentation

## 2022-01-30 DIAGNOSIS — R079 Chest pain, unspecified: Secondary | ICD-10-CM | POA: Insufficient documentation

## 2022-01-30 DIAGNOSIS — I10 Essential (primary) hypertension: Secondary | ICD-10-CM | POA: Insufficient documentation

## 2022-01-30 DIAGNOSIS — E1165 Type 2 diabetes mellitus with hyperglycemia: Secondary | ICD-10-CM | POA: Insufficient documentation

## 2022-01-30 DIAGNOSIS — R0789 Other chest pain: Secondary | ICD-10-CM | POA: Diagnosis not present

## 2022-01-30 LAB — CBC
HCT: 45.2 % (ref 36.0–46.0)
Hemoglobin: 14.6 g/dL (ref 12.0–15.0)
MCH: 30.6 pg (ref 26.0–34.0)
MCHC: 32.3 g/dL (ref 30.0–36.0)
MCV: 94.8 fL (ref 80.0–100.0)
Platelets: 146 10*3/uL — ABNORMAL LOW (ref 150–400)
RBC: 4.77 MIL/uL (ref 3.87–5.11)
RDW: 13.1 % (ref 11.5–15.5)
WBC: 3.8 10*3/uL — ABNORMAL LOW (ref 4.0–10.5)
nRBC: 0 % (ref 0.0–0.2)

## 2022-01-30 LAB — BASIC METABOLIC PANEL
Anion gap: 8 (ref 5–15)
BUN: 15 mg/dL (ref 8–23)
CO2: 26 mmol/L (ref 22–32)
Calcium: 10 mg/dL (ref 8.9–10.3)
Chloride: 106 mmol/L (ref 98–111)
Creatinine, Ser: 0.96 mg/dL (ref 0.44–1.00)
GFR, Estimated: >60 mL/min (ref >60)
Glucose, Bld: 194 mg/dL — ABNORMAL HIGH (ref 70–99)
Potassium: 3.8 mmol/L (ref 3.5–5.1)
Sodium: 140 mmol/L (ref 135–145)

## 2022-01-30 LAB — D-DIMER, QUANTITATIVE: D-Dimer, Quant: <0.27 ug/mL-FEU (ref 0.00–0.50)

## 2022-01-30 LAB — TROPONIN I (HIGH SENSITIVITY)
Troponin I (High Sensitivity): 9 ng/L (ref <18)
Troponin I (High Sensitivity): 16 ng/L (ref <18)
Troponin I (High Sensitivity): 14 ng/L (ref <18)

## 2022-01-30 NOTE — ED Triage Notes (Addendum)
Patient c/o chest cramping that moved from right arm to left arm this AM. Hx CLL cancer. In remission and takes cancer pill daily.

## 2022-01-30 NOTE — Discharge Instructions (Addendum)
-  Follow-up with your primary care provider in regards to your blood pressure, as discussed.  -You will be contacted by cardiologist to schedule an appointment.   -Return to the emergency department anytime if you begin to experience any new or worsening symptoms.

## 2022-01-30 NOTE — ED Provider Notes (Signed)
[facility] Provider Note    Event Date/Time   First MD Initiated Contact with Patient 01/30/22 1301     (approximate)   History   Chief Complaint Chest Pain   HPI Maria [name] is a 71 y.o. female, hypothyroidism, hypertension, asthma, CLL, diabetes, osteoarthritis, presents to the emergency department for evaluation of chest pain.  She states that she was performing her daily chores when she felt a sudden onset of " cramping" in the right side of her chest, which over the course of an hour migrated to the left side of her chest and into her left arm.  After calling EMS, they treated her with different 24 mg of aspirin.  Since then, she has not had any more pain.  Denies fever/chills, shortness of breath, abdominal pain, flank pain, nausea/vomiting, diarrhea, dysuria, rash/lesions, or dizziness/lightheadedness.  Denies any recent illnesses or injuries  History Limitations: No limitations        Physical Exam  Triage Vital Signs: ED Triage Vitals  Enc Vitals Group     BP 01/30/22 1116 (!) 171/73     Pulse Rate 01/30/22 1116 60     Resp 01/30/22 1116 16     Temp 01/30/22 1116 97.6 F (36.4 C)     Temp Source 01/30/22 1116 Oral     SpO2 01/30/22 1116 98 %     Weight 01/30/22 1117 190 lb (86.2 kg)     Height 01/30/22 1117 '5\' 7"'$  (1.702 m)     Head Circumference --      Peak Flow --      Pain Score 01/30/22 1116 0     Pain Loc --      Pain Edu? --      Excl. in [place]? --     Most recent vital signs: Vitals:   01/30/22 1320 01/30/22 1435  BP: (!) 170/87 (!) 165/74  Pulse: 69 65  Resp: 18 17  Temp: 98.5 F (36.9 C) 98.5 F (36.9 C)  SpO2: 100% 100%    General: Awake, NAD.  Skin: Warm, dry. No rashes or lesions.  Eyes: PERRL. Conjunctivae normal.  CV: Good peripheral perfusion.  No chest wall tenderness. Resp: Normal effort.  Lung sounds are clear bilaterally in the apices and bases. Abd: Soft, non-tender. No distention.  Neuro: At  baseline. No gross neurological deficits.   Focused Exam: N/A.  Physical Exam    ED Results / Procedures / Treatments  Labs (all labs ordered are listed, but only abnormal results are displayed) Labs Reviewed  BASIC METABOLIC PANEL - Abnormal; Notable for the following components:      Result Value   Glucose, Bld 194 (*)    All other components within normal limits  CBC - Abnormal; Notable for the following components:   WBC 3.8 (*)    Platelets 146 (*)    All other components within normal limits  D-DIMER, QUANTITATIVE  TROPONIN I (HIGH SENSITIVITY)  TROPONIN I (HIGH SENSITIVITY)  TROPONIN I (HIGH SENSITIVITY)     EKG Sinus rhythm, rate of 63, no significant ST segment changes, no AV blocks, no axis deviations, normal QRS interval.   RADIOLOGY  ED Provider Interpretation: I personally reviewed and interpreted this chest x-ray, no evidence of acute cardiopulmonary disease.  DG Chest 2 View  Result Date: 01/30/2022 CLINICAL DATA:  Chest pain. EXAM: CHEST - 2 VIEW COMPARISON:  None Available. FINDINGS: The heart size and mediastinal contours are within normal limits. Both lungs are  clear. Right internal jugular Port-A-Cath is noted in grossly good position. The visualized skeletal structures are unremarkable. IMPRESSION: No active cardiopulmonary disease. Electronically Signed   By: Marijo Conception M.D.   On: 01/30/2022 11:48    PROCEDURES:  Critical Care performed: None.  Procedures    MEDICATIONS ORDERED IN ED: Medications - No data to display   IMPRESSION / MDM / [place] / ED COURSE  I reviewed the triage vital signs and the nursing notes.                              Differential diagnosis includes, but is not limited to, ACS, peritonitis/myocarditis, pulmonary embolism, pneumonia, costochondritis, hypertension.  ED Course Patient appears well, notably hypertensive at 170/87, otherwise normal vitals.  NAD.  Currently asymptomatic.  CBC shows  no leukocytosis or anemia.  BMP shows hyperglycemia at 194, consistent with her history of diabetes, otherwise no electrolyte abnormalities or evidence of AKI.  Initial troponin 9.  Second troponin 16.  Third troponin 14.  Unlikely ACS or myocarditis.  Assessment/Plan Patient presents with chest pain that started earlier this morning at approximately 0900 that has completely resolved following 324 mg of aspirin p.o.  EKG unremarkable.  Lab work-up has been reassuring.  She is currently asymptomatic.  Low suspicion for PE given unremarkable D-dimer.  Will provide patient with a referral to cardiology.  Additionally advised her to follow-up with her primary care provider for blood pressure management.  We will plan to discharge.  Considered admission for this patient, but given her stable presentation and unremarkable work-up, she is unlikely benefit from admission.  Provided the patient with anticipatory guidance, return precautions, and educational material. Encouraged the patient to return to the emergency department at any time if they begin to experience any new or worsening symptoms. Patient expressed understanding and agreed with the plan.       FINAL CLINICAL IMPRESSION(S) / ED DIAGNOSES   Final diagnoses:  Chest pain, unspecified type     Rx / DC Orders   ED Discharge Orders          Ordered    Ambulatory referral to Cardiology       Comments: If you have not heard from the Cardiology office within the next 72 hours please call [phone].   01/30/22 1602             Note:  This document was prepared using Dragon voice recognition software and may include unintentional dictation errors.   Teodoro Spray, [place] 01/30/22 1603    Duffy Bruce, MD 02/10/22 [phone]

## 2022-02-06 DIAGNOSIS — C9110 Chronic lymphocytic leukemia of B-cell type not having achieved remission: Secondary | ICD-10-CM | POA: Diagnosis not present

## 2022-02-06 DIAGNOSIS — I10 Essential (primary) hypertension: Secondary | ICD-10-CM | POA: Diagnosis not present

## 2022-02-06 DIAGNOSIS — Z86718 Personal history of other venous thrombosis and embolism: Secondary | ICD-10-CM | POA: Diagnosis not present

## 2022-02-06 DIAGNOSIS — E1149 Type 2 diabetes mellitus with other diabetic neurological complication: Secondary | ICD-10-CM | POA: Diagnosis not present

## 2022-02-06 DIAGNOSIS — R0789 Other chest pain: Secondary | ICD-10-CM | POA: Diagnosis not present

## 2022-02-06 DIAGNOSIS — E782 Mixed hyperlipidemia: Secondary | ICD-10-CM | POA: Diagnosis not present

## 2022-02-06 DIAGNOSIS — D849 Immunodeficiency, unspecified: Secondary | ICD-10-CM | POA: Diagnosis not present

## 2022-02-06 DIAGNOSIS — M810 Age-related osteoporosis without current pathological fracture: Secondary | ICD-10-CM | POA: Diagnosis not present

## 2022-02-06 DIAGNOSIS — I700 Atherosclerosis of aorta: Secondary | ICD-10-CM | POA: Diagnosis not present

## 2022-02-13 ENCOUNTER — Other Ambulatory Visit (HOSPITAL_COMMUNITY): Payer: Self-pay

## 2022-02-19 ENCOUNTER — Other Ambulatory Visit (HOSPITAL_COMMUNITY): Payer: Self-pay

## 2022-02-20 DIAGNOSIS — Z961 Presence of intraocular lens: Secondary | ICD-10-CM | POA: Diagnosis not present

## 2022-02-26 ENCOUNTER — Telehealth: Payer: Self-pay

## 2022-02-26 NOTE — Telephone Encounter (Signed)
Returned call to pt. Pt wanted to know if it was ok with Dr Irene Limbo if she takes the shingles vaccine. Per Dr Irene Limbo it is ok to take the shingles vaccine. Pt asked if she would continue valacyclovir after vaccine and Dr Irene Limbo said he would look at medication history and discuss with her at her next visit. Pt acknowledged and verbalized understanding.

## 2022-03-08 ENCOUNTER — Encounter: Payer: Self-pay | Admitting: *Deleted

## 2022-03-08 ENCOUNTER — Other Ambulatory Visit (HOSPITAL_COMMUNITY): Payer: Self-pay

## 2022-03-12 ENCOUNTER — Other Ambulatory Visit (HOSPITAL_COMMUNITY): Payer: Self-pay

## 2022-03-12 ENCOUNTER — Other Ambulatory Visit: Payer: Self-pay | Admitting: Hematology

## 2022-03-12 DIAGNOSIS — C9110 Chronic lymphocytic leukemia of B-cell type not having achieved remission: Secondary | ICD-10-CM

## 2022-03-12 MED ORDER — VENETOCLAX 100 MG PO TABS
ORAL_TABLET | ORAL | 2 refills | Status: DC
  Filled 2022-03-12: qty 60, 30d supply, fill #0
  Filled 2022-04-16: qty 60, 30d supply, fill #1
  Filled 2022-05-17: qty 60, 30d supply, fill #2

## 2022-03-16 ENCOUNTER — Ambulatory Visit: Payer: Medicare PPO | Admitting: Internal Medicine

## 2022-03-16 ENCOUNTER — Encounter: Payer: Self-pay | Admitting: Internal Medicine

## 2022-03-16 VITALS — BP 130/70 | HR 76 | Ht 66.0 in | Wt 203.0 lb

## 2022-03-16 DIAGNOSIS — R079 Chest pain, unspecified: Secondary | ICD-10-CM

## 2022-03-16 DIAGNOSIS — E785 Hyperlipidemia, unspecified: Secondary | ICD-10-CM | POA: Diagnosis not present

## 2022-03-16 DIAGNOSIS — I2584 Coronary atherosclerosis due to calcified coronary lesion: Secondary | ICD-10-CM | POA: Diagnosis not present

## 2022-03-16 DIAGNOSIS — I10 Essential (primary) hypertension: Secondary | ICD-10-CM

## 2022-03-16 DIAGNOSIS — I700 Atherosclerosis of aorta: Secondary | ICD-10-CM

## 2022-03-16 DIAGNOSIS — E1169 Type 2 diabetes mellitus with other specified complication: Secondary | ICD-10-CM | POA: Diagnosis not present

## 2022-03-16 DIAGNOSIS — I2510 Atherosclerotic heart disease of native coronary artery without angina pectoris: Secondary | ICD-10-CM | POA: Diagnosis not present

## 2022-03-16 MED ORDER — ASPIRIN 81 MG PO TBEC: 81.0000 mg | DELAYED_RELEASE_TABLET | Freq: Every day | ORAL | Status: DC

## 2022-03-16 NOTE — Progress Notes (Unsigned)
New Outpatient Visit Date: 03/16/2022  Referring Provider: Teodoro Spray, [place],  [place] [address]  Chief Complaint: Chest pain  HPI:  Ms. Schmale is a 71 y.o. female who is being seen today for the evaluation of chest pain at the request of Teodoro Spray, [place]. She has a history of hypertension, hyperlipidemia, type 2 diabetes mellitus, CLL, asthma, and hypothyroidism.  She presented to the [facility] emergency department in late May complaining of sudden cramping in the right side of her upper back/chest that subsequently migrated to the left side and into the left arm.  High-sensitivity troponin I was negative x3.  D-dimer was also negative.  EKG demonstrated normal sinus rhythm without abnormality.  She has not had any further episodes of chest/back pain.  Total episode that day lasted about 2 hours and was associated with elevated blood pressure readings up to 354 mmHg systolic.  She received aspirin 324 mg by EMS, which seem to help the pain.  She notes that she has had periodic cramps in different parts of her body, but never in the chest like this.  She did not have any associated symptoms like palpitations, lightheadedness, dyspnea, or nausea.  Ms. Bleich denies chest pain otherwise, including with activity.  She has not had any significant shortness of breath, palpitations, or lightheadedness.  She notes occasional swelling in the right leg, which has been present ever since her knee replacement.  She denies a history of prior heart disease.  Only available heart testing in the past was an echocardiogram obtained in 2020 as part of her CLL treatment surveillance.  --------------------------------------------------------------------------------------------------  Cardiovascular History & Procedures: Cardiovascular Problems: Chest pain  Risk Factors: Hypertension, hyperlipidemia, type 2 diabetes mellitus, age greater than 65, and family  history  Cath/PCI: None  CV Surgery: None  EP Procedures and Devices: None  Non-Invasive Evaluation(s): TTE (11/07/2018): LVEF 60-65% with normal diastolic parameters.  Normal RV size and function.  Mild left atrial enlargement.  Degenerative mitral valve with moderate leaflet and annular calcification.  Mild mitral regurgitation noted.  Aortic sclerosis without stenosis present.  Recent CV Pertinent Labs: Lab Results  Component Value Date   CHOL 124 04/07/2019   HDL 27 (L) 04/07/2019   LDLCALC 75 04/07/2019   LDLDIRECT 167.3 01/31/2010   TRIG 109 04/07/2019   CHOLHDL 4.6 04/07/2019   INR 0.9 11/10/2018   K 3.8 01/30/2022   K 3.7 08/05/2017   MG 2.0 12/03/2018   BUN 15 01/30/2022   BUN 17.3 08/05/2017   CREATININE 0.96 01/30/2022   CREATININE 0.94 01/22/2022   CREATININE 1.0 08/05/2017    --------------------------------------------------------------------------------------------------  Past Medical History:  Diagnosis Date   Allergy    Arthritis    Asthma    Cancer ([place])    chronic lymphacytic leukemia   CLL (chronic lymphocytic leukemia) ([place])    Diabetes mellitus    PMH   Headache    migraine   Hyperlipidemia    Hypertension    Hypothyroidism    Lymphoma ([place])    Thyroid disease    hypothyroidism   Wears glasses     Past Surgical History:  Procedure Laterality Date   ABDOMINAL HYSTERECTOMY     AXILLARY LYMPH NODE BIOPSY Right 08/21/2018   Procedure: RIGHT AXILLARY LYMPH NODE BIOPSY ERAS PATHWAY;  Surgeon: [name], MD;  Location: [place];  Service: General;  Laterality: Right;  LMA   BREAST EXCISIONAL BIOPSY Right    COLONOSCOPY  IR IMAGING GUIDED PORT INSERTION  11/10/2018   LYMPH NODE DISSECTION     STRABISMUS SURGERY     TONSILLECTOMY     TOTAL KNEE ARTHROPLASTY Right 12/03/2016   Procedure: RIGHT TOTAL KNEE ARTHROPLASTY;  Surgeon: [name], MD;  Location: [facility];  Service: Orthopedics;  Laterality: Right;    Current Meds   Medication Sig   acetaminophen (TYLENOL 8 HOUR ARTHRITIS PAIN) 650 MG CR tablet Take 650 mg by mouth every 8 (eight) hours as needed for pain.   rosuvastatin (CRESTOR) 10 MG tablet Take 10 mg by mouth at bedtime.   SYNTHROID 88 MCG tablet Take 88 mcg by mouth daily before breakfast.    valACYclovir (VALTREX) 1000 MG tablet TAKE 1 TABLET (1,000 MG TOTAL) BY MOUTH DAILY.   venetoclax (VENCLEXTA) 100 MG tablet TAKE 2 TABLETS BY MOUTH DAILY. TAKE WITH FOOD AND WATER AT APPROXIMATELY THE SAME TIME EACH DAY.    Allergies: Iohexol, Compazine [prochlorperazine edisylate], Contrast media  [iodinated contrast media], Lactose intolerance (gi), and Cephalexin  Social History   Tobacco Use   Smoking status: Never   Smokeless tobacco: Never  Vaping Use   Vaping Use: Never used  Substance Use Topics   Alcohol use: Yes    Comment: occasional glass of wine   Drug use: No    Family History  Problem Relation Age of Onset   Liver cancer Mother    Heart disease Father 18   Breast cancer Sister 26   Heart attack Paternal Uncle    Breast cancer Maternal Grandmother    Colon cancer Neg Hx    Pancreatic cancer Neg Hx    Rectal cancer Neg Hx    Stomach cancer Neg Hx     Review of Systems: A 12-system review of systems was performed and was negative except as noted in the HPI.  --------------------------------------------------------------------------------------------------  Physical Exam: BP 130/70 (BP Location: Right Arm, Patient Position: Sitting, Cuff Size: Large)   Pulse 76   Ht '5\' 6"'$  (1.676 m)   Wt 203 lb (92.1 kg)   SpO2 98%   BMI 32.77 kg/m   General: NAD. HEENT: No conjunctival pallor or scleral icterus. Facemask in place. Neck: Supple without lymphadenopathy, thyromegaly, JVD, or HJR. No carotid bruit. Lungs: Normal work of breathing. Clear to auscultation bilaterally without wheezes or crackles. Heart: Regular rate and rhythm without murmurs, rubs, or gallops. Non-displaced  PMI. Abd: Bowel sounds present. Soft, NT/ND without hepatosplenomegaly Ext: 1+ right and trace left calf edema. Radial, PT, and DP pulses are 2+ bilaterally Skin: Warm and dry without rash. Neuro: CNIII-XII intact. Strength and fine-touch sensation intact in upper and lower extremities bilaterally. Psych: Normal mood and affect.  EKG: Normal sinus rhythm without abnormality.  Lab Results  Component Value Date   WBC 3.8 (L) 01/30/2022   HGB 14.6 01/30/2022   HCT 45.2 01/30/2022   MCV 94.8 01/30/2022   PLT 146 (L) 01/30/2022    Lab Results  Component Value Date   NA 140 01/30/2022   K 3.8 01/30/2022   CL 106 01/30/2022   CO2 26 01/30/2022   BUN 15 01/30/2022   CREATININE 0.96 01/30/2022   GLUCOSE 194 (H) 01/30/2022   ALT 13 01/22/2022    Lab Results  Component Value Date   CHOL 124 04/07/2019   HDL 27 (L) 04/07/2019   LDLCALC 75 04/07/2019   LDLDIRECT 167.3 01/31/2010   TRIG 109 04/07/2019   CHOLHDL 4.6 04/07/2019    --------------------------------------------------------------------------------------------------  ASSESSMENT AND  PLAN: Atypical chest pain, coronary artery calcification, and aortic atherosclerosis: Ms. Schwall reports one episode of chest pain localized primarily in the upper back that occurred at rest in May.  ED work-up at that time was unrevealing.  Physical exam today is also normal with the exception of mild swelling in both calves, right greater than left.  EKG is normal.  Prior cross-sectional imaging of the chest is notable for aortic atherosclerosis and coronary artery calcification (seen on personal review of CT portion of PET/CT from 07/25/2021).  Additional cardiac risk factors include history of hypertension, hyperlipidemia, type 2 diabetes mellitus, and age greater than 87.  We have discussed further evaluation options and have agreed to obtain an exercise MPI, given allergy to iodinated contrast and coronary artery calcification that make  coronary CTA suboptimal.  Pending further evaluation, I have asked Ms. Bermingham to begin taking aspirin 81 mg daily.  We will defer other medication changes pending stress test completion.  Hypertension: Blood pressure borderline today (goal less than 130/80).  Continue ongoing management per Dr. Brigitte Pulse.  Hyperlipidemia associated with type 2 diabetes mellitus: Continue rosuvastatin per Dr. Brigitte Pulse as well as dietary control of DM.  Follow-up: Return to clinic in 3 months.  Nelva Bush, MD 03/16/2022 2:26 PM

## 2022-03-16 NOTE — Patient Instructions (Signed)
Medication Instructions:   Your physician has recommended you make the following change in your medication:   START Aspirin 81 mg daily - you may purchase over-the-counter  *If you need a refill on your cardiac medications before your next appointment, please call your pharmacy*   Lab Work:  None ordered  Testing/Procedures:  [place]  Your caregiver has ordered a Stress Test with nuclear imaging. The purpose of this test is to evaluate the blood supply to your heart muscle. This procedure is referred to as a "Non-Invasive Stress Test." This is because other than having an IV started in your vein, nothing is inserted or "invades" your body. Cardiac stress tests are done to find areas of poor blood flow to the heart by determining the extent of coronary artery disease (CAD). Some patients exercise on a treadmill, which naturally increases the blood flow to your heart, while others who are  unable to walk on a treadmill due to physical limitations have a pharmacologic/chemical stress agent called Lexiscan . This medicine will mimic walking on a treadmill by temporarily increasing your coronary blood flow.   Please note: these test may take anywhere between 2-4 hours to complete  PLEASE REPORT TO [facility] AT THE FIRST DESK WILL DIRECT YOU WHERE TO GO  Date of Procedure:_____________________________________  Arrival Time for Procedure:______________________________   PLEASE NOTIFY THE OFFICE AT LEAST 24 HOURS IN ADVANCE IF YOU ARE UNABLE TO KEEP YOUR APPOINTMENT.  [phone] AND  PLEASE NOTIFY NUCLEAR MEDICINE AT [facility] AT LEAST 24 HOURS IN ADVANCE IF YOU ARE UNABLE TO KEEP YOUR APPOINTMENT. [phone]  How to prepare for your Myoview test:  Do not eat or drink after midnight No caffeine for 24 hours prior to test No smoking 24 hours prior to test. Your medication may be taken with water.  If your doctor stopped a medication because of this  test, do not take that medication. Ladies, please do not wear dresses.  Skirts or pants are appropriate. Please wear a short sleeve shirt. No perfume, cologne or lotion. Wear comfortable walking shoes. No heels!    Follow-Up: At [facility], you and your health needs are our priority.  As part of our continuing mission to provide you with exceptional heart care, we have created designated Provider Care Teams.  These Care Teams include your primary Cardiologist (physician) and Advanced Practice Providers (APPs -  Physician Assistants and Nurse Practitioners) who all work together to provide you with the care you need, when you need it.  We recommend signing up for the patient portal called "MyChart".  Sign up information is provided on this After Visit Summary.  MyChart is used to connect with patients for Virtual Visits (Telemedicine).  Patients are able to view lab/test results, encounter notes, upcoming appointments, etc.  Non-urgent messages can be sent to your provider as well.   To learn more about what you can do with MyChart, go to [email or web address].    Your next appointment:   3 month(s)  The format for your next appointment:   In Person  Provider:   You may see Dr. Harrell Gave End or one of the following Advanced Practice Providers on your designated Care Team:   Murray Hodgkins, NP Christell Faith, PA-C Cadence Kathlen Mody, PA-C{   Important Information About Sugar

## 2022-03-18 ENCOUNTER — Encounter: Payer: Self-pay | Admitting: Internal Medicine

## 2022-03-18 DIAGNOSIS — I2510 Atherosclerotic heart disease of native coronary artery without angina pectoris: Secondary | ICD-10-CM | POA: Insufficient documentation

## 2022-03-18 DIAGNOSIS — E1169 Type 2 diabetes mellitus with other specified complication: Secondary | ICD-10-CM | POA: Insufficient documentation

## 2022-03-18 DIAGNOSIS — R079 Chest pain, unspecified: Secondary | ICD-10-CM | POA: Insufficient documentation

## 2022-03-21 ENCOUNTER — Other Ambulatory Visit (HOSPITAL_COMMUNITY): Payer: Self-pay

## 2022-03-21 ENCOUNTER — Encounter
Admission: RE | Admit: 2022-03-21 | Discharge: 2022-03-21 | Disposition: A | Discharge status: Home / Self Care | Payer: Medicare PPO | Source: Ambulatory Visit | Attending: Internal Medicine | Admitting: Internal Medicine

## 2022-03-21 DIAGNOSIS — R079 Chest pain, unspecified: Secondary | ICD-10-CM | POA: Insufficient documentation

## 2022-03-21 LAB — NM MYOCAR MULTI W/SPECT W/WALL MOTION / EF
Angina Index: 0
Base ST Depression (mm): 0 mm
LV dias vol: 63 mL (ref 46–106)
LV sys vol: 23 mL
Nuc Stress EF: 63 %
Peak HR: 92 {beats}/min
Percent HR: 61 %
Rest HR: 64 {beats}/min
Rest Nuclear Isotope Dose: 10.4 mCi
SDS: 0
SRS: 10
SSS: 1
ST Depression (mm): 0 mm
Stress Nuclear Isotope Dose: 31.1 mCi
TID: 1.07

## 2022-03-21 MED ORDER — TECHNETIUM TC 99M TETROFOSMIN IV KIT
31.1000 | PACK | Freq: Once | INTRAVENOUS | Status: AC | PRN
  Administered 2022-03-21: 31.1 via INTRAVENOUS

## 2022-03-21 MED ORDER — TECHNETIUM TC 99M TETROFOSMIN IV KIT
10.3900 | PACK | Freq: Once | INTRAVENOUS | Status: AC | PRN
  Administered 2022-03-21: 10.39 via INTRAVENOUS

## 2022-03-21 MED ORDER — REGADENOSON 0.4 MG/5ML IV SOLN
0.4000 mg | Freq: Once | INTRAVENOUS | Status: AC
  Administered 2022-03-21: 0.4 mg via INTRAVENOUS

## 2022-04-13 ENCOUNTER — Other Ambulatory Visit (HOSPITAL_COMMUNITY): Payer: Self-pay

## 2022-04-16 ENCOUNTER — Other Ambulatory Visit (HOSPITAL_COMMUNITY): Payer: Self-pay

## 2022-04-20 ENCOUNTER — Other Ambulatory Visit: Payer: Self-pay | Admitting: *Deleted

## 2022-04-20 DIAGNOSIS — D801 Nonfamilial hypogammaglobulinemia: Secondary | ICD-10-CM

## 2022-04-23 ENCOUNTER — Other Ambulatory Visit (HOSPITAL_COMMUNITY): Payer: Self-pay

## 2022-04-24 ENCOUNTER — Other Ambulatory Visit: Payer: Self-pay

## 2022-04-24 ENCOUNTER — Inpatient Hospital Stay: Payer: Medicare PPO

## 2022-04-24 ENCOUNTER — Inpatient Hospital Stay: Payer: Medicare PPO | Attending: Oncology

## 2022-04-24 ENCOUNTER — Inpatient Hospital Stay: Payer: Medicare PPO | Admitting: Hematology

## 2022-04-24 VITALS — BP 147/63 | HR 67 | Temp 97.6°F | Resp 16 | Ht 66.0 in | Wt 203.3 lb

## 2022-04-24 DIAGNOSIS — Z95828 Presence of other vascular implants and grafts: Secondary | ICD-10-CM

## 2022-04-24 DIAGNOSIS — D801 Nonfamilial hypogammaglobulinemia: Secondary | ICD-10-CM | POA: Insufficient documentation

## 2022-04-24 DIAGNOSIS — Z86718 Personal history of other venous thrombosis and embolism: Secondary | ICD-10-CM | POA: Diagnosis not present

## 2022-04-24 DIAGNOSIS — C9110 Chronic lymphocytic leukemia of B-cell type not having achieved remission: Secondary | ICD-10-CM

## 2022-04-24 LAB — CMP (CANCER CENTER ONLY)
ALT: 17 U/L (ref 0–44)
AST: 18 U/L (ref 15–41)
Albumin: 4.4 g/dL (ref 3.5–5.0)
Alkaline Phosphatase: 66 U/L (ref 38–126)
Anion gap: 6 (ref 5–15)
BUN: 19 mg/dL (ref 8–23)
CO2: 25 mmol/L (ref 22–32)
Calcium: 9.9 mg/dL (ref 8.9–10.3)
Chloride: 110 mmol/L (ref 98–111)
Creatinine: 0.9 mg/dL (ref 0.44–1.00)
GFR, Estimated: >60 mL/min (ref >60)
Glucose, Bld: 125 mg/dL — ABNORMAL HIGH (ref 70–99)
Potassium: 4.1 mmol/L (ref 3.5–5.1)
Sodium: 141 mmol/L (ref 135–145)
Total Bilirubin: 1.2 mg/dL (ref 0.3–1.2)
Total Protein: 6.4 g/dL — ABNORMAL LOW (ref 6.5–8.1)

## 2022-04-24 LAB — IRON AND IRON BINDING CAPACITY (CC-WL,HP ONLY)
Iron: 100 ug/dL (ref 28–170)
Saturation Ratios: 24 % (ref 10.4–31.8)
TIBC: 426 ug/dL (ref 250–450)
UIBC: 326 ug/dL (ref 148–442)

## 2022-04-24 LAB — CBC WITH DIFFERENTIAL (CANCER CENTER ONLY)
Abs Immature Granulocytes: 0 10*3/uL (ref 0.00–0.07)
Basophils Absolute: 0 10*3/uL (ref 0.0–0.1)
Basophils Relative: 1 %
Eosinophils Absolute: 0 10*3/uL (ref 0.0–0.5)
Eosinophils Relative: 1 %
HCT: 42.1 % (ref 36.0–46.0)
Hemoglobin: 14.3 g/dL (ref 12.0–15.0)
Immature Granulocytes: 0 %
Lymphocytes Relative: 24 %
Lymphs Abs: 1.2 10*3/uL (ref 0.7–4.0)
MCH: 31.2 pg (ref 26.0–34.0)
MCHC: 34 g/dL (ref 30.0–36.0)
MCV: 91.9 fL (ref 80.0–100.0)
Monocytes Absolute: 0.5 10*3/uL (ref 0.1–1.0)
Monocytes Relative: 9 %
Neutro Abs: 3.3 10*3/uL (ref 1.7–7.7)
Neutrophils Relative %: 65 %
Platelet Count: 148 10*3/uL — ABNORMAL LOW (ref 150–400)
RBC: 4.58 MIL/uL (ref 3.87–5.11)
RDW: 13.3 % (ref 11.5–15.5)
WBC Count: 5.1 10*3/uL (ref 4.0–10.5)
nRBC: 0 % (ref 0.0–0.2)

## 2022-04-24 LAB — FERRITIN: Ferritin: 55 ng/mL (ref 11–307)

## 2022-04-24 LAB — LACTATE DEHYDROGENASE: LDH: 119 U/L (ref 98–192)

## 2022-04-24 MED ORDER — SODIUM CHLORIDE 0.9% FLUSH
10.0000 mL | Freq: Once | INTRAVENOUS | Status: AC
  Administered 2022-04-24: 10 mL

## 2022-04-24 MED ORDER — HEPARIN SOD (PORK) LOCK FLUSH 100 UNIT/ML IV SOLN
500.0000 [IU] | Freq: Once | INTRAVENOUS | Status: AC
  Administered 2022-04-24: 500 [IU]

## 2022-04-26 ENCOUNTER — Telehealth: Payer: Self-pay | Admitting: Hematology

## 2022-04-26 NOTE — Telephone Encounter (Signed)
Scheduled follow-up appointment per 8/15 los. Patient is aware.

## 2022-05-01 ENCOUNTER — Encounter: Payer: Self-pay | Admitting: Oncology

## 2022-05-01 ENCOUNTER — Other Ambulatory Visit: Payer: Self-pay | Admitting: Hematology

## 2022-05-01 MED ORDER — CLOTRIMAZOLE-BETAMETHASONE 1-0.05 % EX CREA: 1.0000 | TOPICAL_CREAM | Freq: Two times a day (BID) | CUTANEOUS | 0 refills | Status: DC

## 2022-05-01 NOTE — Progress Notes (Addendum)
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 Initial Diagnosis   CLL (chronic lymphocytic leukemia) ([place])   08/27/2018 - 10/02/2018 Chemotherapy   The patient had riTUXimab (RITUXAN) 100 mg in sodium chloride 0.9 % 250 mL (0.3846 mg/mL) infusion, 100 mg (100 % of original dose 100 mg), Intravenous,  Once, 4 of 4 cycles Dose modification: 100 mg (original dose 100 mg, Cycle 1, Reason: Provider Judgment), 375 mg/m2 (original dose 100 mg, Cycle 2, Reason: Provider Judgment) Administration: 100 mg (08/27/2018), 800 mg (08/28/2018), 800 mg (09/04/2018), 800 mg (09/11/2018) riTUXimab (RITUXAN) 800 mg in sodium chloride 0.9 % 170 mL infusion, 375 mg/m2 = 800 mg (100 % of original dose 375 mg/m2), Intravenous,  Once, 3 of 5 cycles Dose modification: 375 mg/m2 (original dose 375 mg/m2, Cycle 5) Administration: 800 mg (09/18/2018), 800 mg (09/25/2018), 800 mg (10/02/2018)  for chemotherapy treatment.    10/22/2018 - 10/23/2018 Chemotherapy   The patient had dexamethasone (DECADRON) 4 MG tablet, 8 mg, Oral, Daily, 1 of 1 cycle, Start date: 11/03/2018, End date: 11/03/2018 palonosetron (ALOXI) injection 0.25 mg, 0.25 mg, Intravenous,  Once, 1 of 4 cycles Administration: 0.25 mg (10/22/2018) riTUXimab (RITUXAN) 1,000 mg in sodium chloride 0.9 % 250 mL (2.8571 mg/mL) infusion, 500 mg/m2 = 1,000 mg (100 % of original dose 500 mg/m2), Intravenous,  Once, 1 of 1 cycle Dose  modification: 500 mg/m2 (original dose 500 mg/m2, Cycle 1, Reason: Provider Judgment) bendamustine (BENDEKA) 125 mg in sodium chloride 0.9 % 50 mL (2.2727 mg/mL) chemo infusion, 70 mg/m2 = 125 mg (100 % of original dose 70 mg/m2), Intravenous,  Once, 1 of 4 cycles Dose modification: 70 mg/m2 (original dose 70 mg/m2, Cycle 1, Reason: Provider Judgment) Administration: 125 mg (10/22/2018), 125 mg (10/23/2018) riTUXimab (RITUXAN) 1,000 mg in sodium chloride 0.9 % 150 mL infusion, 500 mg/m2 = 1,000 mg (100 % of original dose 500 mg/m2), Intravenous,  Once, 1 of 1 cycle Dose modification: 500 mg/m2 (original dose 500 mg/m2, Cycle 1) Administration: 1,000 mg (10/22/2018) ofatumumab (ARZERRA) 300 mg in sodium chloride 0.9 % 985 mL chemo infusion, 300 mg (100 % of original dose 300 mg), Intravenous,  Once, 0 of 3 cycles Dose modification: 300 mg (original dose 300 mg, Cycle 2), 1,000 mg (original dose 1,000 mg, Cycle 2)  for chemotherapy treatment.    11/11/2018 - 09/09/2019 Chemotherapy   The patient had obinutuzumab (GAZYVA) 100 mg in sodium chloride 0.9 % 100 mL (0.9615 mg/mL) chemo infusion, 100 mg (100 % of original dose 100 mg), Intravenous, Once, 15 of 16 cycles Dose modification: 100 mg (original dose 100 mg, Cycle 1, Reason: Provider Judgment), 900 mg (original dose 900 mg, Cycle 2, Reason: Provider Judgment) Administration: 100 mg (11/11/2018), 900 mg (11/12/2018), 1,000 mg (11/19/2018), 1,000 mg (12/26/2018), 1,000 mg (02/03/2019), 1,000 mg (03/17/2019), 1,000 mg (04/07/2019), 1,000 mg (04/28/2019), 1,000 mg (02/25/2019), 1,000 mg (05/19/2019), 1,000 mg (06/12/2019), 1,000 mg (07/03/2019), 1,000 mg (07/24/2019), 1,000 mg (08/13/2019), 1,000 mg (09/09/2019)  for chemotherapy treatment.    11/19/2018 - 05/19/2019 Chemotherapy   The  patient had DOXOrubicin (ADRIAMYCIN) chemo injection 100 mg, 50 mg/m2 = 100 mg, Intravenous,  Once, 8 of 8 cycles Dose modification: 25 mg/m2 (original dose 50 mg/m2, Cycle 2, Reason:  Provider Judgment), 75 mg (original dose 50 mg/m2, Cycle 3, Reason: Provider Judgment), 40 mg/m2 (original dose 50 mg/m2, Cycle 3, Reason: Provider Judgment), 30 mg/m2 (original dose 50 mg/m2, Cycle 4, Reason: Provider Judgment, Comment: Per MD instruction), 35 mg/m2 (original dose 50 mg/m2, Cycle 4, Reason: Provider Judgment), 35 mg/m2 (original dose 50 mg/m2, Cycle 5, Reason: Provider Judgment), 35 mg/m2 (original dose 50 mg/m2, Cycle 6, Reason: Provider Judgment), 35 mg/m2 (original dose 50 mg/m2, Cycle 7, Reason: Provider Judgment) Administration: 100 mg (11/19/2018), 50 mg (12/26/2018), 60 mg (02/03/2019), 70 mg (02/24/2019), 70 mg (03/17/2019), 70 mg (04/07/2019), 70 mg (04/28/2019), 70 mg (05/19/2019) palonosetron (ALOXI) injection 0.25 mg, 0.25 mg, Intravenous,  Once, 8 of 8 cycles Administration: 0.25 mg (11/19/2018), 0.25 mg (12/26/2018), 0.25 mg (02/03/2019), 0.25 mg (02/24/2019), 0.25 mg (03/17/2019), 0.25 mg (04/07/2019), 0.25 mg (04/28/2019), 0.25 mg (05/19/2019) pegfilgrastim (NEULASTA) injection 6 mg, 6 mg, Subcutaneous, Once, 1 of 1 cycle Administration: 6 mg (11/21/2018) pegfilgrastim (NEULASTA ONPRO KIT) injection 6 mg, 6 mg, Subcutaneous, Once, 7 of 7 cycles Administration: 6 mg (12/26/2018), 6 mg (02/03/2019), 6 mg (02/24/2019), 6 mg (03/17/2019), 6 mg (04/07/2019), 6 mg (04/28/2019), 6 mg (05/19/2019) cyclophosphamide (CYTOXAN) 1,500 mg in sodium chloride 0.9 % 250 mL chemo infusion, 750 mg/m2 = 1,500 mg, Intravenous,  Once, 8 of 8 cycles Dose modification: 400 mg/m2 (original dose 750 mg/m2, Cycle 2, Reason: Provider Judgment), 500 mg/m2 (original dose 750 mg/m2, Cycle 3, Reason: Provider Judgment), 500 mg/m2 (original dose 750 mg/m2, Cycle 4, Reason: Provider Judgment, Comment: Per MD instruction), 550 mg/m2 (original dose 750 mg/m2, Cycle 4, Reason: Provider Judgment), 550 mg/m2 (original dose 750 mg/m2, Cycle 5, Reason: Provider Judgment), 550 mg/m2 (original dose 750 mg/m2, Cycle 6, Reason: Provider Judgment),  550 mg/m2 (original dose 750 mg/m2, Cycle 7, Reason: Provider Judgment) Administration: 1,500 mg (11/19/2018), 800 mg (12/26/2018), 1,000 mg (02/03/2019), 1,100 mg (02/24/2019), 1,100 mg (03/17/2019), 1,100 mg (04/07/2019), 1,100 mg (04/28/2019), 1,100 mg (05/19/2019) fosaprepitant (EMEND) 150 mg, dexamethasone (DECADRON) 12 mg in sodium chloride 0.9 % 145 mL IVPB, , Intravenous,  Once, 8 of 8 cycles Administration:  (11/19/2018),  (12/26/2018),  (02/03/2019),  (02/24/2019),  (03/17/2019),  (04/07/2019),  (04/28/2019),  (05/19/2019)  for chemotherapy treatment.     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             (  b) rituximab added to ibrutinib, first dose 08/27/2018             (c) hepatitis B studies 08/27/2018 negative             (f) ibrutinib/rituximab discontinued after 10/02/2018 dose w/o obvious response   (6) bendamustine/rituximab started 10/22/2018, discontinued after 1 cycle with rapid progression   (7) obinutuzumab/Gaziva started 11/11/2018             (a) second dose  given 11/19/2018 together with chemotherapy             (b) third dose given 12/26/2018 and continuing every 3 weeks             (c) obinutuzumab discontinued after 09/09/2019 dose, with documentation of complete remission   (8) CH[O]P chemotherapy started 11/19/2018, completed 8 doses 05/19/2019             (a) echocardiogram on 11/07/2018 shows EF of 55-60%             (b) second cycle of CH[O]P postponed because of pandemic, given 12/26/2018, greatly reduced doses             (c) cycle 3 of CH[O]P/obinutuzumab given 02/03/2019 at increased doses             (d) cycle 4 and 5 of CH[O]P given on 6/16 and 7/7 at increased doses of $Remove'35mg'MfmhAKy$ /m2 and $Remov'550mg'sNuaXi$ /m2             (e) Repeat echo on 04/22/2019 shows an EF of 60-65%   (9) severe immunocompromise: Marked hypogammaglobulinemia             (a) starting March 2020 she received IVIG every 8 weeks as needed             (b) starting September 2021 she receives IVIG every 3 months             (c) received Evusheld 11/21/2020   (10) 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 female Is here for continued evaluation and management of CLL/SLL. She continues to be on venetoclax 200 mg p.o. daily without any acute new symptoms. No fevers no chills no night sweats no unexpected weight loss.  No new lumps or bumps.  No abdominal pain or distention.  No new chest pain or shortness of breath.. Labs done today discussed with the patient in  detail.   ALLERGIES:  is allergic to iohexol, compazine [prochlorperazine edisylate], contrast media  [iodinated contrast media], lactose intolerance (gi), and cephalexin.  MEDICATIONS:  Current Outpatient Medications  Medication Sig Dispense Refill   acetaminophen (TYLENOL 8 HOUR ARTHRITIS PAIN) 650 MG CR tablet Take 650 mg by mouth every 8 (eight) hours as needed for pain.     aspirin EC 81 MG tablet Take 1 tablet (81 mg total) by mouth daily. Swallow whole.     rosuvastatin (CRESTOR) 10  MG tablet Take 10 mg by mouth at bedtime.  11   SYNTHROID 88 MCG tablet Take 88 mcg by mouth daily before breakfast.      valACYclovir (VALTREX) 1000 MG tablet TAKE 1 TABLET (1,000 MG TOTAL) BY MOUTH DAILY. 90 tablet 6   venetoclax (VENCLEXTA) 100 MG tablet TAKE 2 TABLETS BY MOUTH DAILY. TAKE WITH FOOD AND WATER AT APPROXIMATELY THE SAME TIME EACH DAY. 60 tablet 2   No current facility-administered medications for this visit.    PHYSICAL EXAMINATION: ECOG PERFORMANCE STATUS: 1 - Symptomatic but completely ambulatory  Vitals:   04/24/22 1027  BP: (!) 147/63  Pulse: 67  Resp: 16  Temp: 97.6 F (36.4 C)  SpO2: 98%    Filed Weights   04/24/22 1027  Weight: 203 lb 4.8 oz (92.2 kg)   NAD GENERAL:alert, in no acute distress and comfortable SKIN: no acute rashes, no significant lesions EYES: conjunctiva are pink and non-injected, sclera anicteric OROPHARYNX: MMM, no exudates, no oropharyngeal erythema or ulceration NECK: supple, no JVD LYMPH:  no palpable lymphadenopathy in the cervical, axillary or inguinal regions LUNGS: clear to auscultation b/l with normal respiratory effort HEART: regular rate & rhythm ABDOMEN:  normoactive bowel sounds , non tender, not distended. Extremity: no pedal edema PSYCH: alert & oriented x 3 with fluent speech NEURO: no focal motor/sensory deficits  LABORATORY DATA:  I have reviewed the data as listed    Latest Ref Rng & Units 04/24/2022   10:13 AM  01/30/2022   11:21 AM 01/22/2022   10:20 AM  CMP  Glucose 70 - 99 mg/dL 125  194  137   BUN 8 - 23 mg/dL $Remove'19  15  16   'ZNQomnz$ Creatinine 0.44 - 1.00 mg/dL 0.90  0.96  0.94   Sodium 135 - 145 mmol/L 141  140  140   Potassium 3.5 - 5.1 mmol/L 4.1  3.8  3.9   Chloride 98 - 111 mmol/L 110  106  108   CO2 22 - 32 mmol/L $RemoveB'25  26  28   'bhFSdJUq$ Calcium 8.9 - 10.3 mg/dL 9.9  10.0  9.3   Total Protein 6.5 - 8.1 g/dL 6.4   6.1   Total Bilirubin 0.3 - 1.2 mg/dL 1.2   0.4   Alkaline Phos 38 - 126 U/L 66   49   AST 15 - 41 U/L 18   16   ALT 0 - 44 U/L 17   13   .    Latest Ref Rng & Units 04/24/2022   10:13 AM 01/30/2022   11:21 AM 01/22/2022   10:20 AM  CBC  WBC 4.0 - 10.5 K/uL 5.1  3.8  4.0   Hemoglobin 12.0 - 15.0 g/dL 14.3  14.6  13.7   Hematocrit 36.0 - 46.0 % 42.1  45.2  40.5   Platelets 150 - 400 K/uL 148  146  146    . 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             (b) rituximab added to ibrutinib, first dose  08/27/2018             (c) hepatitis B studies 08/27/2018 negative             (f) ibrutinib/rituximab discontinued after 10/02/2018 dose w/o obvious response   (6) bendamustine/rituximab started 10/22/2018, discontinued after 1 cycle with rapid progression   (7) obinutuzumab/Gaziva started 11/11/2018             (a) second dose given 11/19/2018 together with chemotherapy             (b) third dose given 12/26/2018 and continuing every 3 weeks             (c) obinutuzumab discontinued after 09/09/2019 dose, with documentation of complete remission   (8) CH[O]P chemotherapy started 11/19/2018, completed 8 doses 05/19/2019             (a) echocardiogram on 11/07/2018 shows EF of 55-60%             (b) second cycle of CH[O]P postponed because of pandemic, given 12/26/2018, greatly reduced doses             (c) cycle 3 of CH[O]P/obinutuzumab given 02/03/2019 at increased doses             (d) cycle 4 and 5 of CH[O]P given on 6/16 and 7/7 at increased doses of $Remove'35mg'lZOxmwf$ /m2 and $Remov'550mg'erkgni$ /m2             (e) Repeat echo on 04/22/2019 shows an EF of 60-65%   (9) severe immunocompromise: Marked hypogammaglobulinemia             (a) starting March 2020 she received IVIG every 8 weeks as needed             (b) starting September 2021 she receives IVIG every 3 months             (c) received Evusheld 11/21/2020   (10) 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  PLAN Patient's labs from today were discussed in detail with the patient today. CBC shows normal hemoglobin of 14.3, WBC count normal at 5.1k and platelets are stable at  148k CMP stable LDH within normal limits at 119 Patient has no new signs or symptoms suggestive of CLL progression at this time No notable toxicities from the patient's current dose of venetoclax at this  time Continue venetoclax 200 mg p.o. daily  No orders of the defined types were placed in this encounter.  FOLLOW UP RTC with Dr Irene Limbo with portflush, labs and MD visit in 3 months  The total time spent in the appointment was 20 minutes*.  All of the patient's questions were answered with apparent satisfaction. The patient knows to call the clinic with any problems, questions or concerns.   Sullivan Lone MD MS AAHIVMS [facility] Hematology/Oncology Physician [facility]  .*Total Encounter Time as defined by the Centers for Medicare and Medicaid Services includes, in addition to the face-to-face time of a patient visit (documented in the note above) non-face-to-face time: obtaining and reviewing outside history, ordering and reviewing medications, tests or procedures, care coordination (communications with other health care professionals or caregivers) and documentation in the medical record.

## 2022-05-15 ENCOUNTER — Other Ambulatory Visit (HOSPITAL_COMMUNITY): Payer: Self-pay

## 2022-05-17 ENCOUNTER — Other Ambulatory Visit (HOSPITAL_COMMUNITY): Payer: Self-pay

## 2022-05-23 ENCOUNTER — Other Ambulatory Visit (HOSPITAL_COMMUNITY): Payer: Self-pay

## 2022-06-04 DIAGNOSIS — I10 Essential (primary) hypertension: Secondary | ICD-10-CM | POA: Diagnosis not present

## 2022-06-04 DIAGNOSIS — R7989 Other specified abnormal findings of blood chemistry: Secondary | ICD-10-CM | POA: Diagnosis not present

## 2022-06-04 DIAGNOSIS — E782 Mixed hyperlipidemia: Secondary | ICD-10-CM | POA: Diagnosis not present

## 2022-06-04 DIAGNOSIS — E1149 Type 2 diabetes mellitus with other diabetic neurological complication: Secondary | ICD-10-CM | POA: Diagnosis not present

## 2022-06-04 DIAGNOSIS — M810 Age-related osteoporosis without current pathological fracture: Secondary | ICD-10-CM | POA: Diagnosis not present

## 2022-06-04 DIAGNOSIS — E039 Hypothyroidism, unspecified: Secondary | ICD-10-CM | POA: Diagnosis not present

## 2022-06-11 DIAGNOSIS — Z1331 Encounter for screening for depression: Secondary | ICD-10-CM | POA: Diagnosis not present

## 2022-06-11 DIAGNOSIS — Z1339 Encounter for screening examination for other mental health and behavioral disorders: Secondary | ICD-10-CM | POA: Diagnosis not present

## 2022-06-11 DIAGNOSIS — D849 Immunodeficiency, unspecified: Secondary | ICD-10-CM | POA: Diagnosis not present

## 2022-06-11 DIAGNOSIS — E782 Mixed hyperlipidemia: Secondary | ICD-10-CM | POA: Diagnosis not present

## 2022-06-11 DIAGNOSIS — I10 Essential (primary) hypertension: Secondary | ICD-10-CM | POA: Diagnosis not present

## 2022-06-11 DIAGNOSIS — C9110 Chronic lymphocytic leukemia of B-cell type not having achieved remission: Secondary | ICD-10-CM | POA: Diagnosis not present

## 2022-06-11 DIAGNOSIS — Z23 Encounter for immunization: Secondary | ICD-10-CM | POA: Diagnosis not present

## 2022-06-11 DIAGNOSIS — R0789 Other chest pain: Secondary | ICD-10-CM | POA: Diagnosis not present

## 2022-06-11 DIAGNOSIS — Z0000 Encounter for general adult medical examination without abnormal findings: Secondary | ICD-10-CM | POA: Diagnosis not present

## 2022-06-11 DIAGNOSIS — R82998 Other abnormal findings in urine: Secondary | ICD-10-CM | POA: Diagnosis not present

## 2022-06-11 DIAGNOSIS — M810 Age-related osteoporosis without current pathological fracture: Secondary | ICD-10-CM | POA: Diagnosis not present

## 2022-06-11 DIAGNOSIS — I700 Atherosclerosis of aorta: Secondary | ICD-10-CM | POA: Diagnosis not present

## 2022-06-11 DIAGNOSIS — E1149 Type 2 diabetes mellitus with other diabetic neurological complication: Secondary | ICD-10-CM | POA: Diagnosis not present

## 2022-06-12 ENCOUNTER — Other Ambulatory Visit (HOSPITAL_COMMUNITY): Payer: Self-pay

## 2022-06-12 ENCOUNTER — Other Ambulatory Visit: Payer: Self-pay | Admitting: Hematology

## 2022-06-12 DIAGNOSIS — C9110 Chronic lymphocytic leukemia of B-cell type not having achieved remission: Secondary | ICD-10-CM

## 2022-06-12 MED ORDER — VENETOCLAX 100 MG PO TABS
ORAL_TABLET | ORAL | 2 refills | Status: DC
  Filled 2022-06-12: qty 60, 30d supply, fill #0
  Filled 2022-07-17: qty 60, 30d supply, fill #1
  Filled 2022-08-14 – 2022-08-15 (×2): qty 60, 30d supply, fill #2

## 2022-06-21 ENCOUNTER — Encounter: Payer: Self-pay | Admitting: Oncology

## 2022-06-21 ENCOUNTER — Telehealth: Payer: Self-pay | Admitting: Pharmacy Technician

## 2022-06-21 ENCOUNTER — Ambulatory Visit: Payer: Medicare PPO | Admitting: Internal Medicine

## 2022-06-21 ENCOUNTER — Other Ambulatory Visit (HOSPITAL_COMMUNITY): Payer: Self-pay

## 2022-06-21 NOTE — Telephone Encounter (Signed)
Oral Oncology Patient Advocate Encounter   Was successful in securing patient a $ 10,000 grant from [employer] [place] ([employer]) to provide copayment coverage for his Venclexta.  This will keep the out of pocket expense at $0.    The billing information is as follows and has been shared with [facility].   Member ID: [id number] Group ID: [id number] RxBin: [id number] Dates of Eligibility: 03/23/2022 through 06/21/2023  Fund:  [place], CPhT-Adv Oncology Pharmacy Patient [place] Direct Number: [phone]  Fax: [phone]

## 2022-07-16 ENCOUNTER — Other Ambulatory Visit (HOSPITAL_COMMUNITY): Payer: Self-pay

## 2022-07-17 ENCOUNTER — Other Ambulatory Visit (HOSPITAL_COMMUNITY): Payer: Self-pay

## 2022-07-23 ENCOUNTER — Other Ambulatory Visit: Payer: Self-pay

## 2022-07-23 DIAGNOSIS — C9110 Chronic lymphocytic leukemia of B-cell type not having achieved remission: Secondary | ICD-10-CM

## 2022-07-24 ENCOUNTER — Other Ambulatory Visit (HOSPITAL_COMMUNITY): Payer: Self-pay

## 2022-07-25 ENCOUNTER — Inpatient Hospital Stay: Payer: Medicare PPO

## 2022-07-25 ENCOUNTER — Other Ambulatory Visit: Payer: Self-pay

## 2022-07-25 ENCOUNTER — Inpatient Hospital Stay: Payer: Medicare PPO | Attending: Hematology | Admitting: Hematology

## 2022-07-25 VITALS — BP 151/50 | HR 77 | Temp 98.1°F | Resp 18 | Ht 66.0 in | Wt 207.3 lb

## 2022-07-25 DIAGNOSIS — C9110 Chronic lymphocytic leukemia of B-cell type not having achieved remission: Secondary | ICD-10-CM | POA: Diagnosis not present

## 2022-07-25 DIAGNOSIS — Z86718 Personal history of other venous thrombosis and embolism: Secondary | ICD-10-CM | POA: Insufficient documentation

## 2022-07-25 DIAGNOSIS — D801 Nonfamilial hypogammaglobulinemia: Secondary | ICD-10-CM | POA: Insufficient documentation

## 2022-07-25 DIAGNOSIS — Z95828 Presence of other vascular implants and grafts: Secondary | ICD-10-CM

## 2022-07-25 LAB — LACTATE DEHYDROGENASE: LDH: 143 U/L (ref 98–192)

## 2022-07-25 LAB — CMP (CANCER CENTER ONLY)
ALT: 17 U/L (ref 0–44)
AST: 22 U/L (ref 15–41)
Albumin: 4.4 g/dL (ref 3.5–5.0)
Alkaline Phosphatase: 70 U/L (ref 38–126)
Anion gap: 4 — ABNORMAL LOW (ref 5–15)
BUN: 15 mg/dL (ref 8–23)
CO2: 28 mmol/L (ref 22–32)
Calcium: 9.4 mg/dL (ref 8.9–10.3)
Chloride: 108 mmol/L (ref 98–111)
Creatinine: 0.9 mg/dL (ref 0.44–1.00)
GFR, Estimated: >60 mL/min (ref >60)
Glucose, Bld: 148 mg/dL — ABNORMAL HIGH (ref 70–99)
Potassium: 4.3 mmol/L (ref 3.5–5.1)
Sodium: 140 mmol/L (ref 135–145)
Total Bilirubin: 0.5 mg/dL (ref 0.3–1.2)
Total Protein: 6.2 g/dL — ABNORMAL LOW (ref 6.5–8.1)

## 2022-07-25 LAB — CBC WITH DIFFERENTIAL (CANCER CENTER ONLY)
Abs Immature Granulocytes: 0.01 10*3/uL (ref 0.00–0.07)
Basophils Absolute: 0 10*3/uL (ref 0.0–0.1)
Basophils Relative: 1 %
Eosinophils Absolute: 0 10*3/uL (ref 0.0–0.5)
Eosinophils Relative: 1 %
HCT: 40.7 % (ref 36.0–46.0)
Hemoglobin: 13.7 g/dL (ref 12.0–15.0)
Immature Granulocytes: 0 %
Lymphocytes Relative: 27 %
Lymphs Abs: 1.2 10*3/uL (ref 0.7–4.0)
MCH: 31.1 pg (ref 26.0–34.0)
MCHC: 33.7 g/dL (ref 30.0–36.0)
MCV: 92.5 fL (ref 80.0–100.0)
Monocytes Absolute: 0.4 10*3/uL (ref 0.1–1.0)
Monocytes Relative: 10 %
Neutro Abs: 2.7 10*3/uL (ref 1.7–7.7)
Neutrophils Relative %: 61 %
Platelet Count: 156 10*3/uL (ref 150–400)
RBC: 4.4 MIL/uL (ref 3.87–5.11)
RDW: 13.2 % (ref 11.5–15.5)
WBC Count: 4.3 10*3/uL (ref 4.0–10.5)
nRBC: 0 % (ref 0.0–0.2)

## 2022-07-25 LAB — IRON AND IRON BINDING CAPACITY (CC-WL,HP ONLY)
Iron: 65 ug/dL (ref 28–170)
Saturation Ratios: 15 % (ref 10.4–31.8)
TIBC: 435 ug/dL (ref 250–450)
UIBC: 370 ug/dL (ref 148–442)

## 2022-07-25 LAB — FERRITIN: Ferritin: 9 ng/mL — ABNORMAL LOW (ref 11–307)

## 2022-07-25 MED ORDER — HEPARIN SOD (PORK) LOCK FLUSH 100 UNIT/ML IV SOLN
500.0000 [IU] | Freq: Once | INTRAVENOUS | Status: AC
  Administered 2022-07-25: 500 [IU]

## 2022-07-25 MED ORDER — SODIUM CHLORIDE 0.9% FLUSH
10.0000 mL | Freq: Once | INTRAVENOUS | Status: AC
  Administered 2022-07-25: 10 mL

## 2022-07-25 NOTE — Progress Notes (Signed)
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 Initial Diagnosis   CLL (chronic lymphocytic leukemia) ([place])   08/27/2018 - 10/02/2018 Chemotherapy   The patient had riTUXimab (RITUXAN) 100 mg in sodium chloride 0.9 % 250 mL (0.3846 mg/mL) infusion, 100 mg (100 % of original dose 100 mg), Intravenous,  Once, 4 of 4 cycles Dose modification: 100 mg (original dose 100 mg, Cycle 1, Reason: Provider Judgment), 375 mg/m2 (original dose 100 mg, Cycle 2, Reason: Provider Judgment) Administration: 100 mg (08/27/2018), 800 mg (08/28/2018), 800 mg (09/04/2018), 800 mg (09/11/2018) riTUXimab (RITUXAN) 800 mg in sodium chloride 0.9 % 170 mL infusion, 375 mg/m2 = 800 mg (100 % of original dose 375 mg/m2), Intravenous,  Once, 3 of 5 cycles Dose modification: 375 mg/m2 (original dose 375 mg/m2, Cycle 5) Administration: 800 mg (09/18/2018), 800 mg (09/25/2018), 800 mg (10/02/2018)  for chemotherapy treatment.    10/22/2018 - 10/23/2018 Chemotherapy   The patient had dexamethasone (DECADRON) 4 MG tablet, 8 mg, Oral, Daily, 1 of 1 cycle, Start date: 11/03/2018, End date: 11/03/2018 palonosetron (ALOXI) injection 0.25 mg, 0.25 mg, Intravenous,  Once, 1 of 4 cycles Administration: 0.25 mg (10/22/2018) riTUXimab (RITUXAN) 1,000 mg in sodium chloride 0.9 % 250 mL (2.8571 mg/mL) infusion, 500 mg/m2 = 1,000 mg (100 % of original dose 500 mg/m2), Intravenous,  Once, 1 of 1 cycle Dose  modification: 500 mg/m2 (original dose 500 mg/m2, Cycle 1, Reason: Provider Judgment) bendamustine (BENDEKA) 125 mg in sodium chloride 0.9 % 50 mL (2.2727 mg/mL) chemo infusion, 70 mg/m2 = 125 mg (100 % of original dose 70 mg/m2), Intravenous,  Once, 1 of 4 cycles Dose modification: 70 mg/m2 (original dose 70 mg/m2, Cycle 1, Reason: Provider Judgment) Administration: 125 mg (10/22/2018), 125 mg (10/23/2018) riTUXimab (RITUXAN) 1,000 mg in sodium chloride 0.9 % 150 mL infusion, 500 mg/m2 = 1,000 mg (100 % of original dose 500 mg/m2), Intravenous,  Once, 1 of 1 cycle Dose modification: 500 mg/m2 (original dose 500 mg/m2, Cycle 1) Administration: 1,000 mg (10/22/2018) ofatumumab (ARZERRA) 300 mg in sodium chloride 0.9 % 985 mL chemo infusion, 300 mg (100 % of original dose 300 mg), Intravenous,  Once, 0 of 3 cycles Dose modification: 300 mg (original dose 300 mg, Cycle 2), 1,000 mg (original dose 1,000 mg, Cycle 2)  for chemotherapy treatment.    11/11/2018 - 09/09/2019 Chemotherapy   The patient had obinutuzumab (GAZYVA) 100 mg in sodium chloride 0.9 % 100 mL (0.9615 mg/mL) chemo infusion, 100 mg (100 % of original dose 100 mg), Intravenous, Once, 15 of 16 cycles Dose modification: 100 mg (original dose 100 mg, Cycle 1, Reason: Provider Judgment), 900 mg (original dose 900 mg, Cycle 2, Reason: Provider Judgment) Administration: 100 mg (11/11/2018), 900 mg (11/12/2018), 1,000 mg (11/19/2018), 1,000 mg (12/26/2018), 1,000 mg (02/03/2019), 1,000 mg (03/17/2019), 1,000 mg (04/07/2019), 1,000 mg (04/28/2019), 1,000 mg (02/25/2019), 1,000 mg (05/19/2019), 1,000 mg (06/12/2019), 1,000 mg (07/03/2019), 1,000 mg (07/24/2019), 1,000 mg (08/13/2019), 1,000 mg (09/09/2019)  for chemotherapy treatment.    11/19/2018 - 05/19/2019 Chemotherapy  The patient had DOXOrubicin (ADRIAMYCIN) chemo injection 100 mg, 50 mg/m2 = 100 mg, Intravenous,  Once, 8 of 8 cycles Dose modification: 25 mg/m2 (original dose 50 mg/m2, Cycle 2, Reason:  Provider Judgment), 75 mg (original dose 50 mg/m2, Cycle 3, Reason: Provider Judgment), 40 mg/m2 (original dose 50 mg/m2, Cycle 3, Reason: Provider Judgment), 30 mg/m2 (original dose 50 mg/m2, Cycle 4, Reason: Provider Judgment, Comment: Per MD instruction), 35 mg/m2 (original dose 50 mg/m2, Cycle 4, Reason: Provider Judgment), 35 mg/m2 (original dose 50 mg/m2, Cycle 5, Reason: Provider Judgment), 35 mg/m2 (original dose 50 mg/m2, Cycle 6, Reason: Provider Judgment), 35 mg/m2 (original dose 50 mg/m2, Cycle 7, Reason: Provider Judgment) Administration: 100 mg (11/19/2018), 50 mg (12/26/2018), 60 mg (02/03/2019), 70 mg (02/24/2019), 70 mg (03/17/2019), 70 mg (04/07/2019), 70 mg (04/28/2019), 70 mg (05/19/2019) palonosetron (ALOXI) injection 0.25 mg, 0.25 mg, Intravenous,  Once, 8 of 8 cycles Administration: 0.25 mg (11/19/2018), 0.25 mg (12/26/2018), 0.25 mg (02/03/2019), 0.25 mg (02/24/2019), 0.25 mg (03/17/2019), 0.25 mg (04/07/2019), 0.25 mg (04/28/2019), 0.25 mg (05/19/2019) pegfilgrastim (NEULASTA) injection 6 mg, 6 mg, Subcutaneous, Once, 1 of 1 cycle Administration: 6 mg (11/21/2018) pegfilgrastim (NEULASTA ONPRO KIT) injection 6 mg, 6 mg, Subcutaneous, Once, 7 of 7 cycles Administration: 6 mg (12/26/2018), 6 mg (02/03/2019), 6 mg (02/24/2019), 6 mg (03/17/2019), 6 mg (04/07/2019), 6 mg (04/28/2019), 6 mg (05/19/2019) cyclophosphamide (CYTOXAN) 1,500 mg in sodium chloride 0.9 % 250 mL chemo infusion, 750 mg/m2 = 1,500 mg, Intravenous,  Once, 8 of 8 cycles Dose modification: 400 mg/m2 (original dose 750 mg/m2, Cycle 2, Reason: Provider Judgment), 500 mg/m2 (original dose 750 mg/m2, Cycle 3, Reason: Provider Judgment), 500 mg/m2 (original dose 750 mg/m2, Cycle 4, Reason: Provider Judgment, Comment: Per MD instruction), 550 mg/m2 (original dose 750 mg/m2, Cycle 4, Reason: Provider Judgment), 550 mg/m2 (original dose 750 mg/m2, Cycle 5, Reason: Provider Judgment), 550 mg/m2 (original dose 750 mg/m2, Cycle 6, Reason: Provider Judgment),  550 mg/m2 (original dose 750 mg/m2, Cycle 7, Reason: Provider Judgment) Administration: 1,500 mg (11/19/2018), 800 mg (12/26/2018), 1,000 mg (02/03/2019), 1,100 mg (02/24/2019), 1,100 mg (03/17/2019), 1,100 mg (04/07/2019), 1,100 mg (04/28/2019), 1,100 mg (05/19/2019) fosaprepitant (EMEND) 150 mg, dexamethasone (DECADRON) 12 mg in sodium chloride 0.9 % 145 mL IVPB, , Intravenous,  Once, 8 of 8 cycles Administration:  (11/19/2018),  (12/26/2018),  (02/03/2019),  (02/24/2019),  (03/17/2019),  (04/07/2019),  (04/28/2019),  (05/19/2019)  for chemotherapy treatment.     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             (  b) rituximab added to ibrutinib, first dose 08/27/2018             (c) hepatitis B studies 08/27/2018 negative             (f) ibrutinib/rituximab discontinued after 10/02/2018 dose w/o obvious response   (6) bendamustine/rituximab started 10/22/2018, discontinued after 1 cycle with rapid progression   (7) obinutuzumab/Gaziva started 11/11/2018             (a) second dose  given 11/19/2018 together with chemotherapy             (b) third dose given 12/26/2018 and continuing every 3 weeks             (c) obinutuzumab discontinued after 09/09/2019 dose, with documentation of complete remission   (8) CH[O]P chemotherapy started 11/19/2018, completed 8 doses 05/19/2019             (a) echocardiogram on 11/07/2018 shows EF of 55-60%             (b) second cycle of CH[O]P postponed because of pandemic, given 12/26/2018, greatly reduced doses             (c) cycle 3 of CH[O]P/obinutuzumab given 02/03/2019 at increased doses             (d) cycle 4 and 5 of CH[O]P given on 6/16 and 7/7 at increased doses of 11m/m2 and 5572mm2             (e) Repeat echo on 04/22/2019 shows an EF of 60-65%   (9) severe immunocompromise: Marked hypogammaglobulinemia             (a) starting March 2020 she received IVIG every 8 weeks as needed             (b) starting September 2021 she receives IVIG every 3 months             (c) received Evusheld 11/21/2020   (10) 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 female Is here for continued evaluation and management of CLL/SLL.  Patient was last seen by me on 04/24/2022 and was doing well without any new medical concerns.   Patient reports she has been doing well since our last visit.  She notes she is currently taking an antifungal medication, Terbinafine 250 mg.   She denies any recent infections, fever, chills,  abnormal bowl moments, diarrhea, unexpected weight loss, abdominal pain, back pain, and leg swelling.   Patient reports she has gained weight since our last visit and is trying to change her diet and exercise more often to loss weight.   She has received her Shigrex vaccine and influenza vaccine. She denies of receiving the COVID-19 Booster and RSV vaccine, but will get it soon.   She denies toxicities from the the current dose of venetoclax during today's visit.   She is up to-date with  all her caner screenings.   She denies taking Vitamin-D supplements. She notes she is lactose intolerance.    ALLERGIES:  is allergic to iohexol, compazine [prochlorperazine edisylate], contrast media  [iodinated contrast media], lactose intolerance (gi), and cephalexin.  MEDICATIONS:  Current Outpatient Medications  Medication Sig Dispense Refill   acetaminophen (TYLENOL 8 HOUR ARTHRITIS PAIN) 650 MG CR tablet Take 650 mg by mouth every 8 (eight) hours as needed for pain.     aspirin EC 81 MG tablet Take 1 tablet (81 mg total) by mouth daily. Swallow whole.     clotrimazole-betamethasone (LOTRISONE) cream Apply 1 Application topically 2 (two) times daily. 30 g 0   rosuvastatin (CRESTOR) 10 MG tablet Take 10 mg by mouth at bedtime.  11   SYNTHROID 88 MCG tablet Take 88 mcg by mouth daily before breakfast.      valACYclovir (VALTREX) 1000 MG tablet TAKE 1 TABLET (1,000 MG TOTAL) BY MOUTH DAILY. 90 tablet 6   venetoclax (VENCLEXTA) 100 MG tablet TAKE 2 TABLETS BY MOUTH DAILY. TAKE WITH FOOD AND WATER AT APPROXIMATELY THE SAME TIME EACH DAY. 60 tablet 2   No current facility-administered medications for this visit.    PHYSICAL EXAMINATION: ECOG PERFORMANCE STATUS: 1 - Symptomatic but completely ambulatory  There were no vitals filed for this visit.   There were no vitals filed for this visit.  NAD GENERAL:alert, in no acute distress and comfortable SKIN: no acute rashes, no significant  lesions EYES: conjunctiva are pink and non-injected, sclera anicteric OROPHARYNX: MMM, no exudates, no oropharyngeal erythema or ulceration NECK: supple, no JVD LYMPH:  no palpable lymphadenopathy in the cervical, axillary or inguinal regions LUNGS: clear to auscultation b/l with normal respiratory effort HEART: regular rate & rhythm ABDOMEN:  normoactive bowel sounds , non tender, not distended. Extremity: no pedal edema PSYCH: alert & oriented x 3 with fluent speech NEURO: no focal motor/sensory deficits  LABORATORY DATA:  I have reviewed the data as listed    Latest Ref Rng & Units 07/25/2022   12:40 PM 04/24/2022   10:13 AM 01/30/2022   11:21 AM  CMP  Glucose 70 - 99 mg/dL 148  125  194   BUN 8 - 23 mg/dL _0 Creatinine 0.44 - 1.00 mg/dL 0.90  0.90  0.96   Sodium 135 - 145 mmol/L 140  141  140   Potassium 3.5 - 5.1 mmol/L 4.3  4.1  3.8   Chloride 98 - 111 mmol/L 108  110  106   CO2 22 - 32 mmol/L _1 Calcium 8.9 - 10.3 mg/dL 9.4  9.9  10.0   Total Protein 6.5 - 8.1 g/dL 6.2  6.4    Total Bilirubin 0.3 - 1.2 mg/dL 0.5  1.2    Alkaline Phos 38 - 126 U/L 70  66    AST 15 - 41 U/L 22  18    ALT 0 - 44 U/L 17  17    .    Latest Ref Rng & Units 07/25/2022   12:40 PM 04/24/2022   10:13 AM 01/30/2022   11:21 AM  CBC  WBC 4.0 - 10.5 K/uL 4.3  5.1  3.8   Hemoglobin 12.0 - 15.0 g/dL 13.7  14.3  14.6   Hematocrit 36.0 - 46.0 % 40.7  42.1  45.2   Platelets 150 - 400 K/uL 156  148  146    . Lab Results  Component  Value Date   LDH 143 07/25/2022     ASSESSMENT & PLAN:   CLL (chronic lymphocytic leukemia) ([place]) history of chronic lymphoid leukemia initially diagnosed in January 2003,    (1) treated in 2005 with cyclophosphamide, vincristine, prednisone and Rituxan   (2) treated next in 2008 with cyclophosphamide, fludarabine and rituximab, last dose November of 2008   (3) status post right axillary lymph node biopsy 07/18/2012 showing small lymphocytic  lymphoma/ chronic lymphocytic leukemia, with coexpression of CD5 and CD43. 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             (b) rituximab added to ibrutinib, first dose 08/27/2018             (c) hepatitis B studies 08/27/2018 negative             (f) ibrutinib/rituximab discontinued after 10/02/2018 dose w/o obvious response   (6) bendamustine/rituximab started 10/22/2018, discontinued after 1 cycle with rapid progression   (7) obinutuzumab/Gaziva started 11/11/2018             (a) second dose given 11/19/2018 together with chemotherapy             (b) third dose given 12/26/2018 and continuing every 3 weeks             (c) obinutuzumab discontinued after 09/09/2019 dose, with documentation of complete remission   (8) CH[O]P chemotherapy started 11/19/2018, completed 8 doses 05/19/2019             (a) echocardiogram on 11/07/2018 shows EF of 55-60%             (b) second cycle of CH[O]P postponed because of pandemic, given 12/26/2018, greatly reduced doses             (c) cycle 3 of CH[O]P/obinutuzumab given 02/03/2019 at increased doses             (d) cycle 4 and 5 of CH[O]P given on 6/16 and 7/7 at increased doses of 58m/m2 and 5534mm2             (e) Repeat echo on 04/22/2019 shows an EF of 60-65%   (9) severe immunocompromise: Marked hypogammaglobulinemia             (a) starting March 2020 she received IVIG every 8 weeks as needed             (b) starting September 2021 she receives IVIG every 3 months             (c) received  Evusheld 11/21/2020   (10) 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           rivaroxaban stopped December 2021   (11) started venetoclax ramp-up 06/11/2019, progressed to 400 mg/d dose w/o event             (a) dose reduced to 200 mg daily 08/05/2019 secondary to  cytopenias             (b) PET scan December 2021 shows small minimally hypermetabolic residual periportal lymph nodes but no additional adenopathy elsewhere.  PLAN: -Patient's labs from today were discussed in detail with the patient today. CBC shows normal hemoglobin of 13.7 K, WBC count normal at 4.3 K, and platelets are stable at 156 K. Stable CMP. -Patient has no new signs or symptoms suggestive of CLL progression at this time -No notable toxicities from the patient's current dose of venetoclax at this time -Continue venetoclax 200 mg p.o. daily. -Recommended COVID-19 Booster and RSV vaccine.  -Recommended on starting vitamin-D 2000 mg supplements.  -Stop Valacyclovir 1000 mg from now.    FOLLOW UP: RTC with Dr Irene Limbo with portflush, labs and MD visit in 3 months  The total time spent in the appointment was 21 minutes* .  All of the patient's questions were answered with apparent satisfaction. The patient knows to call the clinic with any problems, questions or concerns.   Sullivan Lone MD MS AAHIVMS [facility] Hematology/Oncology Physician [facility]  .*Total Encounter Time as defined by the Centers for Medicare and Medicaid Services includes, in addition to the face-to-face time of a patient visit (documented in the note above) non-face-to-face time: obtaining and reviewing outside history, ordering and reviewing medications, tests or procedures, care coordination (communications with other health care professionals or caregivers) and documentation in the medical record.   Zettie Cooley, am acting as a Education administrator for Sullivan Lone, MD.

## 2022-07-31 ENCOUNTER — Encounter: Payer: Self-pay | Admitting: Oncology

## 2022-08-13 ENCOUNTER — Other Ambulatory Visit (HOSPITAL_COMMUNITY): Payer: Self-pay

## 2022-08-14 ENCOUNTER — Other Ambulatory Visit (HOSPITAL_COMMUNITY): Payer: Self-pay

## 2022-08-15 ENCOUNTER — Other Ambulatory Visit (HOSPITAL_COMMUNITY): Payer: Self-pay

## 2022-08-16 ENCOUNTER — Other Ambulatory Visit (HOSPITAL_COMMUNITY): Payer: Self-pay

## 2022-08-21 ENCOUNTER — Other Ambulatory Visit: Payer: Self-pay

## 2022-09-06 ENCOUNTER — Other Ambulatory Visit (HOSPITAL_COMMUNITY): Payer: Self-pay

## 2022-09-11 ENCOUNTER — Other Ambulatory Visit: Payer: Self-pay | Admitting: Hematology

## 2022-09-11 ENCOUNTER — Other Ambulatory Visit (HOSPITAL_COMMUNITY): Payer: Self-pay

## 2022-09-11 DIAGNOSIS — C9110 Chronic lymphocytic leukemia of B-cell type not having achieved remission: Secondary | ICD-10-CM

## 2022-09-11 MED ORDER — VENETOCLAX 100 MG PO TABS
ORAL_TABLET | ORAL | 2 refills | Status: DC
  Filled 2022-09-11 – 2022-09-19 (×2): qty 60, 30d supply, fill #0
  Filled 2022-10-12: qty 60, 30d supply, fill #1
  Filled 2022-11-13: qty 60, 30d supply, fill #2

## 2022-09-19 ENCOUNTER — Other Ambulatory Visit: Payer: Self-pay

## 2022-09-19 ENCOUNTER — Other Ambulatory Visit (HOSPITAL_COMMUNITY): Payer: Self-pay

## 2022-10-10 ENCOUNTER — Other Ambulatory Visit (HOSPITAL_COMMUNITY): Payer: Self-pay

## 2022-10-12 ENCOUNTER — Other Ambulatory Visit (HOSPITAL_COMMUNITY): Payer: Self-pay

## 2022-10-19 ENCOUNTER — Other Ambulatory Visit (HOSPITAL_COMMUNITY): Payer: Self-pay

## 2022-10-22 ENCOUNTER — Other Ambulatory Visit: Payer: Self-pay

## 2022-10-22 DIAGNOSIS — C9110 Chronic lymphocytic leukemia of B-cell type not having achieved remission: Secondary | ICD-10-CM

## 2022-10-23 ENCOUNTER — Inpatient Hospital Stay: Payer: Medicare PPO | Attending: Hematology

## 2022-10-23 ENCOUNTER — Inpatient Hospital Stay: Payer: Medicare PPO | Admitting: Hematology

## 2022-10-23 VITALS — BP 140/59 | HR 65 | Temp 97.2°F | Resp 18 | Wt 212.3 lb

## 2022-10-23 DIAGNOSIS — C9110 Chronic lymphocytic leukemia of B-cell type not having achieved remission: Secondary | ICD-10-CM

## 2022-10-23 DIAGNOSIS — Z95828 Presence of other vascular implants and grafts: Secondary | ICD-10-CM

## 2022-10-23 DIAGNOSIS — D801 Nonfamilial hypogammaglobulinemia: Secondary | ICD-10-CM

## 2022-10-23 LAB — CBC WITH DIFFERENTIAL (CANCER CENTER ONLY)
Abs Immature Granulocytes: 0 10*3/uL (ref 0.00–0.07)
Basophils Absolute: 0 10*3/uL (ref 0.0–0.1)
Basophils Relative: 1 %
Eosinophils Absolute: 0 10*3/uL (ref 0.0–0.5)
Eosinophils Relative: 1 %
HCT: 41.6 % (ref 36.0–46.0)
Hemoglobin: 14 g/dL (ref 12.0–15.0)
Immature Granulocytes: 0 %
Lymphocytes Relative: 25 %
Lymphs Abs: 1 10*3/uL (ref 0.7–4.0)
MCH: 29.6 pg (ref 26.0–34.0)
MCHC: 33.7 g/dL (ref 30.0–36.0)
MCV: 87.9 fL (ref 80.0–100.0)
Monocytes Absolute: 0.3 10*3/uL (ref 0.1–1.0)
Monocytes Relative: 9 %
Neutro Abs: 2.6 10*3/uL (ref 1.7–7.7)
Neutrophils Relative %: 64 %
Platelet Count: 157 10*3/uL (ref 150–400)
RBC: 4.73 MIL/uL (ref 3.87–5.11)
RDW: 13.2 % (ref 11.5–15.5)
WBC Count: 3.9 10*3/uL — ABNORMAL LOW (ref 4.0–10.5)
nRBC: 0 % (ref 0.0–0.2)

## 2022-10-23 LAB — CMP (CANCER CENTER ONLY)
ALT: 19 U/L (ref 0–44)
AST: 18 U/L (ref 15–41)
Albumin: 4.3 g/dL (ref 3.5–5.0)
Alkaline Phosphatase: 79 U/L (ref 38–126)
Anion gap: 6 (ref 5–15)
BUN: 16 mg/dL (ref 8–23)
CO2: 28 mmol/L (ref 22–32)
Calcium: 9.6 mg/dL (ref 8.9–10.3)
Chloride: 106 mmol/L (ref 98–111)
Creatinine: 0.94 mg/dL (ref 0.44–1.00)
GFR, Estimated: >60 mL/min (ref >60)
Glucose, Bld: 218 mg/dL — ABNORMAL HIGH (ref 70–99)
Potassium: 4.1 mmol/L (ref 3.5–5.1)
Sodium: 140 mmol/L (ref 135–145)
Total Bilirubin: 0.6 mg/dL (ref 0.3–1.2)
Total Protein: 5.9 g/dL — ABNORMAL LOW (ref 6.5–8.1)

## 2022-10-23 LAB — IRON AND IRON BINDING CAPACITY (CC-WL,HP ONLY)
Iron: 92 ug/dL (ref 28–170)
Saturation Ratios: 22 % (ref 10.4–31.8)
TIBC: 427 ug/dL (ref 250–450)
UIBC: 335 ug/dL (ref 148–442)

## 2022-10-23 LAB — FERRITIN: Ferritin: 12 ng/mL (ref 11–307)

## 2022-10-23 LAB — LACTATE DEHYDROGENASE: LDH: 129 U/L (ref 98–192)

## 2022-10-23 MED ORDER — SODIUM CHLORIDE 0.9% FLUSH
10.0000 mL | Freq: Once | INTRAVENOUS | Status: AC
  Administered 2022-10-23: 10 mL

## 2022-10-23 MED ORDER — HEPARIN SOD (PORK) LOCK FLUSH 100 UNIT/ML IV SOLN
500.0000 [IU] | Freq: Once | INTRAVENOUS | Status: AC
  Administered 2022-10-23: 500 [IU]

## 2022-10-23 NOTE — Progress Notes (Signed)
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 Initial Diagnosis   CLL (chronic lymphocytic leukemia) ([place])   08/27/2018 - 10/02/2018 Chemotherapy   The patient had riTUXimab (RITUXAN) 100 mg in sodium chloride 0.9 % 250 mL (0.3846 mg/mL) infusion, 100 mg (100 % of original dose 100 mg), Intravenous,  Once, 4 of 4 cycles Dose modification: 100 mg (original dose 100 mg, Cycle 1, Reason: Provider Judgment), 375 mg/m2 (original dose 100 mg, Cycle 2, Reason: Provider Judgment) Administration: 100 mg (08/27/2018), 800 mg (08/28/2018), 800 mg (09/04/2018), 800 mg (09/11/2018) riTUXimab (RITUXAN) 800 mg in sodium chloride 0.9 % 170 mL infusion, 375 mg/m2 = 800 mg (100 % of original dose 375 mg/m2), Intravenous,  Once, 3 of 5 cycles Dose modification: 375 mg/m2 (original dose 375 mg/m2, Cycle 5) Administration: 800 mg (09/18/2018), 800 mg (09/25/2018), 800 mg (10/02/2018)  for chemotherapy treatment.    10/22/2018 - 10/23/2018 Chemotherapy   The patient had dexamethasone (DECADRON) 4 MG tablet, 8 mg, Oral, Daily, 1 of 1 cycle, Start date: 11/03/2018, End date: 11/03/2018 palonosetron (ALOXI) injection 0.25 mg, 0.25 mg, Intravenous,  Once, 1 of 4 cycles Administration: 0.25 mg (10/22/2018) riTUXimab (RITUXAN) 1,000 mg in sodium chloride 0.9 % 250 mL (2.8571 mg/mL) infusion, 500 mg/m2 = 1,000 mg (100 % of original dose 500 mg/m2), Intravenous,  Once, 1 of 1 cycle Dose modification: 500 mg/m2 (original dose 500 mg/m2, Cycle 1, Reason: Provider  Judgment) bendamustine (BENDEKA) 125 mg in sodium chloride 0.9 % 50 mL (2.2727 mg/mL) chemo infusion, 70 mg/m2 = 125 mg (100 % of original dose 70 mg/m2), Intravenous,  Once, 1 of 4 cycles Dose modification: 70 mg/m2 (original dose 70 mg/m2, Cycle 1, Reason: Provider Judgment) Administration: 125 mg (10/22/2018), 125 mg (10/23/2018) riTUXimab (RITUXAN) 1,000 mg in sodium chloride 0.9 % 150 mL infusion, 500 mg/m2 = 1,000 mg (100 % of original dose 500 mg/m2), Intravenous,  Once, 1 of 1 cycle Dose modification: 500 mg/m2 (original dose 500 mg/m2, Cycle 1) Administration: 1,000 mg (10/22/2018) ofatumumab (ARZERRA) 300 mg in sodium chloride 0.9 % 985 mL chemo infusion, 300 mg (100 % of original dose 300 mg), Intravenous,  Once, 0 of 3 cycles Dose modification: 300 mg (original dose 300 mg, Cycle 2), 1,000 mg (original dose 1,000 mg, Cycle 2)  for chemotherapy treatment.    11/11/2018 - 09/09/2019 Chemotherapy   The patient had obinutuzumab (GAZYVA) 100 mg in sodium chloride 0.9 % 100 mL (0.9615 mg/mL) chemo infusion, 100 mg (100 % of original dose 100 mg), Intravenous, Once, 15 of 16 cycles Dose modification: 100 mg (original dose 100 mg, Cycle 1, Reason: Provider Judgment), 900 mg (original dose 900 mg, Cycle 2, Reason: Provider Judgment) Administration: 100 mg (11/11/2018), 900 mg (11/12/2018), 1,000 mg (11/19/2018), 1,000 mg (12/26/2018), 1,000 mg (02/03/2019), 1,000 mg (03/17/2019), 1,000 mg (04/07/2019), 1,000 mg (04/28/2019), 1,000 mg (02/25/2019), 1,000 mg (05/19/2019), 1,000 mg (06/12/2019), 1,000 mg (07/03/2019), 1,000 mg (07/24/2019), 1,000 mg (08/13/2019), 1,000 mg (09/09/2019)  for chemotherapy treatment.    11/19/2018 - 05/19/2019 Chemotherapy   The patient had DOXOrubicin (ADRIAMYCIN) chemo injection 100 mg, 50 mg/m2 =  100 mg, Intravenous,  Once, 8 of 8 cycles Dose modification: 25 mg/m2 (original dose 50 mg/m2, Cycle 2, Reason: Provider Judgment), 75 mg (original dose 50 mg/m2, Cycle 3, Reason: Provider  Judgment), 40 mg/m2 (original dose 50 mg/m2, Cycle 3, Reason: Provider Judgment), 30 mg/m2 (original dose 50 mg/m2, Cycle 4, Reason: Provider Judgment, Comment: Per MD instruction), 35 mg/m2 (original dose 50 mg/m2, Cycle 4, Reason: Provider Judgment), 35 mg/m2 (original dose 50 mg/m2, Cycle 5, Reason: Provider Judgment), 35 mg/m2 (original dose 50 mg/m2, Cycle 6, Reason: Provider Judgment), 35 mg/m2 (original dose 50 mg/m2, Cycle 7, Reason: Provider Judgment) Administration: 100 mg (11/19/2018), 50 mg (12/26/2018), 60 mg (02/03/2019), 70 mg (02/24/2019), 70 mg (03/17/2019), 70 mg (04/07/2019), 70 mg (04/28/2019), 70 mg (05/19/2019) palonosetron (ALOXI) injection 0.25 mg, 0.25 mg, Intravenous,  Once, 8 of 8 cycles Administration: 0.25 mg (11/19/2018), 0.25 mg (12/26/2018), 0.25 mg (02/03/2019), 0.25 mg (02/24/2019), 0.25 mg (03/17/2019), 0.25 mg (04/07/2019), 0.25 mg (04/28/2019), 0.25 mg (05/19/2019) pegfilgrastim (NEULASTA) injection 6 mg, 6 mg, Subcutaneous, Once, 1 of 1 cycle Administration: 6 mg (11/21/2018) pegfilgrastim (NEULASTA ONPRO KIT) injection 6 mg, 6 mg, Subcutaneous, Once, 7 of 7 cycles Administration: 6 mg (12/26/2018), 6 mg (02/03/2019), 6 mg (02/24/2019), 6 mg (03/17/2019), 6 mg (04/07/2019), 6 mg (04/28/2019), 6 mg (05/19/2019) cyclophosphamide (CYTOXAN) 1,500 mg in sodium chloride 0.9 % 250 mL chemo infusion, 750 mg/m2 = 1,500 mg, Intravenous,  Once, 8 of 8 cycles Dose modification: 400 mg/m2 (original dose 750 mg/m2, Cycle 2, Reason: Provider Judgment), 500 mg/m2 (original dose 750 mg/m2, Cycle 3, Reason: Provider Judgment), 500 mg/m2 (original dose 750 mg/m2, Cycle 4, Reason: Provider Judgment, Comment: Per MD instruction), 550 mg/m2 (original dose 750 mg/m2, Cycle 4, Reason: Provider Judgment), 550 mg/m2 (original dose 750 mg/m2, Cycle 5, Reason: Provider Judgment), 550 mg/m2 (original dose 750 mg/m2, Cycle 6, Reason: Provider Judgment), 550 mg/m2 (original dose 750 mg/m2, Cycle 7, Reason: Provider  Judgment) Administration: 1,500 mg (11/19/2018), 800 mg (12/26/2018), 1,000 mg (02/03/2019), 1,100 mg (02/24/2019), 1,100 mg (03/17/2019), 1,100 mg (04/07/2019), 1,100 mg (04/28/2019), 1,100 mg (05/19/2019) fosaprepitant (EMEND) 150 mg, dexamethasone (DECADRON) 12 mg in sodium chloride 0.9 % 145 mL IVPB, , Intravenous,  Once, 8 of 8 cycles Administration:  (11/19/2018),  (12/26/2018),  (02/03/2019),  (02/24/2019),  (03/17/2019),  (04/07/2019),  (04/28/2019),  (05/19/2019)  for chemotherapy treatment.     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             (b) rituximab added to  ibrutinib, first dose 08/27/2018             (c) hepatitis B studies 08/27/2018 negative             (f) ibrutinib/rituximab discontinued after 10/02/2018 dose w/o obvious response   (6) bendamustine/rituximab started 10/22/2018, discontinued after 1 cycle with rapid progression   (7) obinutuzumab/Gaziva started 11/11/2018             (a) second dose given 11/19/2018 together with chemotherapy             (b)  third dose given 12/26/2018 and continuing every 3 weeks             (c) obinutuzumab discontinued after 09/09/2019 dose, with documentation of complete remission   (8) CH[O]P chemotherapy started 11/19/2018, completed 8 doses 05/19/2019             (a) echocardiogram on 11/07/2018 shows EF of 55-60%             (b) second cycle of CH[O]P postponed because of pandemic, given 12/26/2018, greatly reduced doses             (c) cycle 3 of CH[O]P/obinutuzumab given 02/03/2019 at increased doses             (d) cycle 4 and 5 of CH[O]P given on 6/16 and 7/7 at increased doses of 26m/m2 and 5552mm2             (e) Repeat echo on 04/22/2019 shows an EF of 60-65%   (9) severe immunocompromise: Marked hypogammaglobulinemia             (a) starting March 2020 she received IVIG every 8 weeks as needed             (b) starting September 2021 she receives IVIG every 3 months             (c) received Evusheld 11/21/2020   (10) 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 female Is here for continued evaluation and management of CLL/SLL.  Patient was last seen by me on 07/25/2022 and she was doing well overall.   Patient reports she has been doing well overall since our last visit. She does report of increased weight since our last visit. She notes she started taking vitamin-D supplement. She has discontinued Valacyclovir 1000 mg and has discontinued aspirin 81 mg.   She notes she has been  tolerating her current treatment of Venetoclax well without any toxicities.   Patient denies fever, chills, night sweats, fatigue, SOB, new lumps/bumps, abnormal bowel movement, abdominal pain, back pain, or chest pain. She does report mild congestion and bilateral leg swelling.   Patient denies of being on any medication for diabetes.   She has received her influenza vaccine, COVID-19 Booster, and RSV vaccine. She is up to speed with all other age appropriate vaccines.    ALLERGIES:  is allergic to iohexol, compazine [prochlorperazine edisylate], contrast media  [iodinated contrast media], lactose intolerance (gi), and cephalexin.  MEDICATIONS:  Current Outpatient Medications  Medication Sig Dispense Refill   acetaminophen (TYLENOL 8 HOUR ARTHRITIS PAIN) 650 MG CR tablet Take 650 mg by mouth every 8 (eight) hours as needed for pain.     aspirin EC 81 MG tablet Take 1 tablet (81 mg total) by mouth daily. Swallow whole.     clotrimazole-betamethasone (LOTRISONE) cream Apply 1 Application topically 2 (two) times daily. 30 g 0   rosuvastatin (CRESTOR) 10 MG tablet Take 10 mg by mouth at bedtime.  11   SYNTHROID 88 MCG tablet Take 88 mcg by mouth daily before breakfast.      terbinafine (LAMISIL) 250 MG tablet 1 tablet Orally Once a day for 90 days     valACYclovir (VALTREX) 1000 MG tablet TAKE 1 TABLET (1,000 MG TOTAL) BY MOUTH DAILY. 90 tablet 6   venetoclax (VENCLEXTA) 100 MG tablet TAKE 2 TABLETS BY MOUTH DAILY. TAKE WITH FOOD AND WATER AT APPROXIMATELY THE SAME TIME EACH DAY. 60 tablet 2   No current facility-administered medications for this visit.    PHYSICAL EXAMINATION: ECOG PERFORMANCE STATUS: 1 - Symptomatic but completely ambulatory  Vitals:   10/23/22 1035  BP: (!) 140/59  Pulse: 65  Resp: 18  Temp: (!) 97.2 F (36.2 C)  SpO2: 99%   Filed Weights   10/23/22 1035  Weight: 212 lb 4.8 oz (96.3 kg)    NAD GENERAL:alert, in no acute distress and comfortable SKIN: no  acute rashes, no significant lesions EYES: conjunctiva are pink and non-injected, sclera anicteric OROPHARYNX: MMM, no exudates, no oropharyngeal erythema or ulceration NECK: supple, no JVD LYMPH:  no palpable lymphadenopathy in the cervical, axillary or inguinal regions LUNGS: clear to auscultation b/l with normal respiratory effort HEART: regular rate & rhythm ABDOMEN:  normoactive bowel sounds , non tender, not distended. Extremity: no pedal edema PSYCH: alert & oriented x 3 with fluent speech NEURO: no focal motor/sensory deficits  LABORATORY DATA:  I have reviewed the data as listed    Latest Ref Rng & Units 10/23/2022    9:58 AM 07/25/2022   12:40 PM 04/24/2022   10:13 AM  CMP  Glucose 70 - 99 mg/dL 218  148  125   BUN 8 - 23 mg/dL 16  15  19   $ Creatinine 0.44 - 1.00 mg/dL 0.94  0.90  0.90   Sodium 135 - 145 mmol/L 140  140  141   Potassium 3.5 - 5.1 mmol/L 4.1  4.3  4.1   Chloride 98 - 111 mmol/L 106  108  110   CO2 22 - 32 mmol/L 28  28  25   $ Calcium 8.9 - 10.3 mg/dL 9.6  9.4  9.9   Total Protein 6.5 - 8.1 g/dL 5.9  6.2  6.4   Total Bilirubin 0.3 - 1.2 mg/dL 0.6  0.5  1.2   Alkaline Phos 38 - 126 U/L 79  70  66   AST 15 - 41 U/L 18  22  18   $ ALT 0 - 44 U/L 19  17  17   $ .    Latest Ref Rng & Units 10/23/2022    9:58 AM 07/25/2022   12:40 PM 04/24/2022   10:13 AM  CBC  WBC 4.0 - 10.5 K/uL 3.9  4.3  5.1   Hemoglobin 12.0 - 15.0 g/dL 14.0  13.7  14.3   Hematocrit 36.0 -  46.0 % 41.6  40.7  42.1   Platelets 150 - 400 K/uL 157  156  148    . Lab Results  Component Value Date   LDH 129 10/23/2022   ASSESSMENT & PLAN:   CLL (chronic lymphocytic leukemia) ([place]) history of chronic lymphoid leukemia initially diagnosed in January 2003,    (1) treated in 2005 with cyclophosphamide, vincristine, prednisone and Rituxan   (2) treated next in 2008 with cyclophosphamide, fludarabine and rituximab, last dose November of 2008   (3) status post right axillary lymph node  biopsy 07/18/2012 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             (b) rituximab added to ibrutinib, first dose 08/27/2018             (c) hepatitis B studies 08/27/2018 negative             (f) ibrutinib/rituximab discontinued after 10/02/2018 dose w/o obvious response   (6) bendamustine/rituximab started 10/22/2018, discontinued after 1 cycle with rapid progression   (7) obinutuzumab/Gaziva started 11/11/2018             (a) second dose given 11/19/2018 together with chemotherapy             (b) third dose given 12/26/2018 and continuing every 3 weeks             (c) obinutuzumab discontinued after 09/09/2019 dose, with documentation of complete remission   (8) CH[O]P chemotherapy started 11/19/2018, completed 8 doses 05/19/2019             (a) echocardiogram on 11/07/2018 shows EF of 55-60%             (b) second cycle of CH[O]P postponed because of pandemic, given 12/26/2018, greatly reduced doses             (c) cycle 3 of CH[O]P/obinutuzumab given 02/03/2019 at increased doses             (d) cycle 4 and 5 of CH[O]P given on 6/16 and 7/7 at increased doses of 59m/m2 and 5566mm2             (e) Repeat echo on 04/22/2019 shows an EF of 60-65%   (9) severe immunocompromise: Marked hypogammaglobulinemia             (a) starting March 2020 she received IVIG every 8 weeks as needed             (b) starting September 2021 she receives IVIG  every 3 months             (c) received Evusheld 11/21/2020   (10) 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           rivaroxaban stopped December 2021   (11) started venetoclax ramp-up 06/11/2019, progressed to 400 mg/d  dose w/o event             (a) dose reduced to 200 mg daily 08/05/2019 secondary to cytopenias             (b) PET scan December 2021 shows small minimally hypermetabolic residual periportal lymph nodes but no additional adenopathy elsewhere.  PLAN: -discussed lab results from today, 10/23/2022, with the patient. CBC is stable. CMP shows elevated glucose levels of 218.  -Recommended to follow-up with PCP regarding A1c because elevated glucose level today of 218. -Recommended to wear compression socks to reduce leg swelling.  -Patient has no new signs or symptoms suggestive of CLL progression at this time -No notable toxicities from the patient's current dose of venetoclax at this time -Continue venetoclax 200 mg p.o. daily.  FOLLOW UP: RTC with Dr Irene Limbo with portflush, labs and MD visit in 3 months  The total time spent in the appointment was 20 minutes* .  All of the patient's questions were answered with apparent satisfaction. The patient knows to call the clinic with any problems, questions or concerns.   Sullivan Lone MD MS AAHIVMS [facility] Hematology/Oncology Physician [facility]  .*Total Encounter Time as defined by the Centers for Medicare and Medicaid Services includes, in addition to the face-to-face time of a patient visit (documented in the note above) non-face-to-face time: obtaining and reviewing outside history, ordering and reviewing medications, tests or procedures, care coordination (communications with other health care professionals or caregivers) and documentation in the medical record.   I, Cleda Mccreedy, am acting as a Education administrator for Sullivan Lone, MD. .I have reviewed the above documentation for accuracy and  completeness, and I agree with the above. Brunetta Genera MD

## 2022-10-24 ENCOUNTER — Telehealth: Payer: Self-pay | Admitting: Hematology

## 2022-10-24 NOTE — Telephone Encounter (Signed)
Called patient per 2/13 los notes to schedule f/u. Patient scheduled and notified.

## 2022-10-29 ENCOUNTER — Encounter: Payer: Self-pay | Admitting: Oncology

## 2022-11-13 ENCOUNTER — Other Ambulatory Visit (HOSPITAL_COMMUNITY): Payer: Self-pay

## 2022-11-19 ENCOUNTER — Other Ambulatory Visit: Payer: Self-pay

## 2022-12-13 ENCOUNTER — Other Ambulatory Visit (HOSPITAL_COMMUNITY): Payer: Self-pay

## 2022-12-13 ENCOUNTER — Other Ambulatory Visit: Payer: Self-pay | Admitting: Hematology

## 2022-12-13 DIAGNOSIS — C9110 Chronic lymphocytic leukemia of B-cell type not having achieved remission: Secondary | ICD-10-CM

## 2022-12-14 ENCOUNTER — Encounter: Payer: Self-pay | Admitting: Oncology

## 2022-12-14 ENCOUNTER — Other Ambulatory Visit (HOSPITAL_COMMUNITY): Payer: Self-pay

## 2022-12-14 MED ORDER — VENETOCLAX 100 MG PO TABS
ORAL_TABLET | ORAL | 2 refills | Status: DC
  Filled 2022-12-14: qty 56, 28d supply, fill #0
  Filled 2023-01-15: qty 56, 28d supply, fill #1
  Filled 2023-02-06: qty 56, 28d supply, fill #2

## 2022-12-19 ENCOUNTER — Other Ambulatory Visit (HOSPITAL_COMMUNITY): Payer: Self-pay

## 2022-12-19 ENCOUNTER — Other Ambulatory Visit: Payer: Self-pay

## 2022-12-20 ENCOUNTER — Other Ambulatory Visit: Payer: Self-pay

## 2022-12-21 DIAGNOSIS — D849 Immunodeficiency, unspecified: Secondary | ICD-10-CM | POA: Diagnosis not present

## 2022-12-21 DIAGNOSIS — I700 Atherosclerosis of aorta: Secondary | ICD-10-CM | POA: Diagnosis not present

## 2022-12-21 DIAGNOSIS — L989 Disorder of the skin and subcutaneous tissue, unspecified: Secondary | ICD-10-CM | POA: Diagnosis not present

## 2022-12-21 DIAGNOSIS — I10 Essential (primary) hypertension: Secondary | ICD-10-CM | POA: Diagnosis not present

## 2022-12-21 DIAGNOSIS — E782 Mixed hyperlipidemia: Secondary | ICD-10-CM | POA: Diagnosis not present

## 2022-12-21 DIAGNOSIS — M810 Age-related osteoporosis without current pathological fracture: Secondary | ICD-10-CM | POA: Diagnosis not present

## 2022-12-21 DIAGNOSIS — E1149 Type 2 diabetes mellitus with other diabetic neurological complication: Secondary | ICD-10-CM | POA: Diagnosis not present

## 2022-12-21 DIAGNOSIS — C9110 Chronic lymphocytic leukemia of B-cell type not having achieved remission: Secondary | ICD-10-CM | POA: Diagnosis not present

## 2023-01-08 DIAGNOSIS — Z789 Other specified health status: Secondary | ICD-10-CM | POA: Diagnosis not present

## 2023-01-08 DIAGNOSIS — D0439 Carcinoma in situ of skin of other parts of face: Secondary | ICD-10-CM | POA: Diagnosis not present

## 2023-01-08 DIAGNOSIS — D485 Neoplasm of uncertain behavior of skin: Secondary | ICD-10-CM | POA: Diagnosis not present

## 2023-01-08 DIAGNOSIS — L538 Other specified erythematous conditions: Secondary | ICD-10-CM | POA: Diagnosis not present

## 2023-01-08 DIAGNOSIS — R208 Other disturbances of skin sensation: Secondary | ICD-10-CM | POA: Diagnosis not present

## 2023-01-08 DIAGNOSIS — L298 Other pruritus: Secondary | ICD-10-CM | POA: Diagnosis not present

## 2023-01-08 DIAGNOSIS — L820 Inflamed seborrheic keratosis: Secondary | ICD-10-CM | POA: Diagnosis not present

## 2023-01-15 ENCOUNTER — Other Ambulatory Visit (HOSPITAL_COMMUNITY): Payer: Self-pay

## 2023-01-16 ENCOUNTER — Other Ambulatory Visit (HOSPITAL_COMMUNITY): Payer: Self-pay

## 2023-01-16 ENCOUNTER — Other Ambulatory Visit: Payer: Self-pay

## 2023-01-16 ENCOUNTER — Encounter (HOSPITAL_BASED_OUTPATIENT_CLINIC_OR_DEPARTMENT_OTHER): Payer: Self-pay | Admitting: Pharmacist

## 2023-01-16 ENCOUNTER — Other Ambulatory Visit (HOSPITAL_BASED_OUTPATIENT_CLINIC_OR_DEPARTMENT_OTHER): Payer: Self-pay

## 2023-01-16 MED ORDER — MOUNJARO 5 MG/0.5ML ~~LOC~~ SOAJ
SUBCUTANEOUS | 3 refills | Status: DC
  Filled 2023-01-16: qty 2, 28d supply, fill #0
  Filled 2023-02-08 (×2): qty 2, 28d supply, fill #1
  Filled 2023-03-06: qty 2, 28d supply, fill #2

## 2023-01-17 ENCOUNTER — Other Ambulatory Visit (HOSPITAL_BASED_OUTPATIENT_CLINIC_OR_DEPARTMENT_OTHER): Payer: Self-pay

## 2023-01-17 ENCOUNTER — Other Ambulatory Visit: Payer: Self-pay

## 2023-01-17 DIAGNOSIS — C9110 Chronic lymphocytic leukemia of B-cell type not having achieved remission: Secondary | ICD-10-CM

## 2023-01-22 ENCOUNTER — Inpatient Hospital Stay: Payer: Medicare PPO

## 2023-01-22 ENCOUNTER — Inpatient Hospital Stay: Payer: Medicare PPO | Attending: Hematology | Admitting: Hematology

## 2023-01-22 VITALS — BP 147/66 | HR 61 | Temp 97.8°F | Resp 17 | Wt 208.5 lb

## 2023-01-22 DIAGNOSIS — Z95828 Presence of other vascular implants and grafts: Secondary | ICD-10-CM

## 2023-01-22 DIAGNOSIS — D801 Nonfamilial hypogammaglobulinemia: Secondary | ICD-10-CM

## 2023-01-22 DIAGNOSIS — C9110 Chronic lymphocytic leukemia of B-cell type not having achieved remission: Secondary | ICD-10-CM | POA: Diagnosis not present

## 2023-01-22 LAB — CMP (CANCER CENTER ONLY)
ALT: 13 U/L (ref 0–44)
AST: 16 U/L (ref 15–41)
Albumin: 4.3 g/dL (ref 3.5–5.0)
Alkaline Phosphatase: 62 U/L (ref 38–126)
Anion gap: 5 (ref 5–15)
BUN: 16 mg/dL (ref 8–23)
CO2: 27 mmol/L (ref 22–32)
Calcium: 9.6 mg/dL (ref 8.9–10.3)
Chloride: 110 mmol/L (ref 98–111)
Creatinine: 1 mg/dL (ref 0.44–1.00)
GFR, Estimated: >60 mL/min (ref >60)
Glucose, Bld: 132 mg/dL — ABNORMAL HIGH (ref 70–99)
Potassium: 4 mmol/L (ref 3.5–5.1)
Sodium: 142 mmol/L (ref 135–145)
Total Bilirubin: 0.7 mg/dL (ref 0.3–1.2)
Total Protein: 6 g/dL — ABNORMAL LOW (ref 6.5–8.1)

## 2023-01-22 LAB — CBC WITH DIFFERENTIAL (CANCER CENTER ONLY)
Abs Immature Granulocytes: 0.01 10*3/uL (ref 0.00–0.07)
Basophils Absolute: 0 10*3/uL (ref 0.0–0.1)
Basophils Relative: 1 %
Eosinophils Absolute: 0 10*3/uL (ref 0.0–0.5)
Eosinophils Relative: 1 %
HCT: 41.8 % (ref 36.0–46.0)
Hemoglobin: 14.1 g/dL (ref 12.0–15.0)
Immature Granulocytes: 0 %
Lymphocytes Relative: 25 %
Lymphs Abs: 1.1 10*3/uL (ref 0.7–4.0)
MCH: 29.7 pg (ref 26.0–34.0)
MCHC: 33.7 g/dL (ref 30.0–36.0)
MCV: 88 fL (ref 80.0–100.0)
Monocytes Absolute: 0.4 10*3/uL (ref 0.1–1.0)
Monocytes Relative: 9 %
Neutro Abs: 2.7 10*3/uL (ref 1.7–7.7)
Neutrophils Relative %: 64 %
Platelet Count: 156 10*3/uL (ref 150–400)
RBC: 4.75 MIL/uL (ref 3.87–5.11)
RDW: 13.3 % (ref 11.5–15.5)
WBC Count: 4.3 10*3/uL (ref 4.0–10.5)
nRBC: 0 % (ref 0.0–0.2)

## 2023-01-22 LAB — IRON AND IRON BINDING CAPACITY (CC-WL,HP ONLY)
Iron: 103 ug/dL (ref 28–170)
Saturation Ratios: 25 % (ref 10.4–31.8)
TIBC: 413 ug/dL (ref 250–450)
UIBC: 310 ug/dL (ref 148–442)

## 2023-01-22 LAB — LACTATE DEHYDROGENASE: LDH: 140 U/L (ref 98–192)

## 2023-01-22 LAB — FERRITIN: Ferritin: 14 ng/mL (ref 11–307)

## 2023-01-22 MED ORDER — HEPARIN SOD (PORK) LOCK FLUSH 100 UNIT/ML IV SOLN
500.0000 [IU] | Freq: Once | INTRAVENOUS | Status: AC
  Administered 2023-01-22: 500 [IU]

## 2023-01-22 MED ORDER — SODIUM CHLORIDE 0.9% FLUSH
10.0000 mL | Freq: Once | INTRAVENOUS | Status: AC
  Administered 2023-01-22: 10 mL

## 2023-01-22 NOTE — Progress Notes (Signed)
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  SUMMARY OF ONCOLOGIC HISTORY: Oncology History  CLL (chronic lymphocytic leukemia) (HCC)  07/24/2012 Initial Diagnosis   CLL (chronic lymphocytic leukemia) (HCC)   08/27/2018 - 10/02/2018 Chemotherapy   The patient had riTUXimab (RITUXAN) 100 mg in sodium chloride 0.9 % 250 mL (0.3846 mg/mL) infusion, 100 mg (100 % of original dose 100 mg), Intravenous,  Once, 4 of 4 cycles Dose modification: 100 mg (original dose 100 mg, Cycle 1, Reason: Provider Judgment), 375 mg/m2 (original dose 100 mg, Cycle 2, Reason: Provider Judgment) Administration: 100 mg (08/27/2018), 800 mg (08/28/2018), 800 mg (09/04/2018), 800 mg (09/11/2018) riTUXimab (RITUXAN) 800 mg in sodium chloride 0.9 % 170 mL infusion, 375 mg/m2 = 800 mg (100 % of original dose 375 mg/m2), Intravenous,  Once, 3 of 5 cycles Dose modification: 375 mg/m2 (original dose 375 mg/m2, Cycle 5) Administration: 800 mg (09/18/2018), 800 mg (09/25/2018), 800 mg (10/02/2018)  for chemotherapy treatment.    10/22/2018 - 10/23/2018 Chemotherapy   The patient had dexamethasone (DECADRON) 4 MG tablet, 8 mg, Oral, Daily, 1 of 1 cycle, Start date: 11/03/2018, End date: 11/03/2018 palonosetron (ALOXI) injection 0.25 mg, 0.25 mg, Intravenous,  Once, 1 of 4 cycles Administration: 0.25 mg (10/22/2018) riTUXimab (RITUXAN) 1,000 mg in sodium chloride 0.9 % 250 mL (2.8571 mg/mL) infusion, 500 mg/m2 = 1,000 mg (100 % of original dose 500 mg/m2), Intravenous,  Once, 1 of 1 cycle Dose modification: 500 mg/m2 (original dose 500 mg/m2, Cycle 1, Reason: Provider  Judgment) bendamustine (BENDEKA) 125 mg in sodium chloride 0.9 % 50 mL (2.2727 mg/mL) chemo infusion, 70 mg/m2 = 125 mg (100 % of original dose 70 mg/m2), Intravenous,  Once, 1 of 4 cycles Dose modification: 70 mg/m2 (original dose 70 mg/m2, Cycle 1, Reason: Provider Judgment) Administration: 125 mg (10/22/2018), 125 mg (10/23/2018) riTUXimab (RITUXAN) 1,000 mg in sodium chloride 0.9 % 150 mL infusion, 500 mg/m2 = 1,000 mg (100 % of original dose 500 mg/m2), Intravenous,  Once, 1 of 1 cycle Dose modification: 500 mg/m2 (original dose 500 mg/m2, Cycle 1) Administration: 1,000 mg (10/22/2018) ofatumumab (ARZERRA) 300 mg in sodium chloride 0.9 % 985 mL chemo infusion, 300 mg (100 % of original dose 300 mg), Intravenous,  Once, 0 of 3 cycles Dose modification: 300 mg (original dose 300 mg, Cycle 2), 1,000 mg (original dose 1,000 mg, Cycle 2)  for chemotherapy treatment.    11/11/2018 - 09/09/2019 Chemotherapy   The patient had obinutuzumab (GAZYVA) 100 mg in sodium chloride 0.9 % 100 mL (0.9615 mg/mL) chemo infusion, 100 mg (100 % of original dose 100 mg), Intravenous, Once, 15 of 16 cycles Dose modification: 100 mg (original dose 100 mg, Cycle 1, Reason: Provider Judgment), 900 mg (original dose 900 mg, Cycle 2, Reason: Provider Judgment) Administration: 100 mg (11/11/2018), 900 mg (11/12/2018), 1,000 mg (11/19/2018), 1,000 mg (12/26/2018), 1,000 mg (02/03/2019), 1,000 mg (03/17/2019), 1,000 mg (04/07/2019), 1,000 mg (04/28/2019), 1,000 mg (02/25/2019), 1,000 mg (05/19/2019), 1,000 mg (06/12/2019), 1,000 mg (07/03/2019), 1,000 mg (07/24/2019), 1,000 mg (08/13/2019), 1,000 mg (09/09/2019)  for chemotherapy treatment.    11/19/2018 - 05/19/2019 Chemotherapy   The patient had DOXOrubicin (ADRIAMYCIN) chemo injection 100 mg, 50 mg/m2 = 100  mg, Intravenous,  Once, 8 of 8 cycles Dose modification: 25 mg/m2 (original dose 50 mg/m2, Cycle 2, Reason: Provider Judgment), 75 mg (original dose 50 mg/m2, Cycle 3, Reason: Provider  Judgment), 40 mg/m2 (original dose 50 mg/m2, Cycle 3, Reason: Provider Judgment), 30 mg/m2 (original dose 50 mg/m2, Cycle 4, Reason: Provider Judgment, Comment: Per MD instruction), 35 mg/m2 (original dose 50 mg/m2, Cycle 4, Reason: Provider Judgment), 35 mg/m2 (original dose 50 mg/m2, Cycle 5, Reason: Provider Judgment), 35 mg/m2 (original dose 50 mg/m2, Cycle 6, Reason: Provider Judgment), 35 mg/m2 (original dose 50 mg/m2, Cycle 7, Reason: Provider Judgment) Administration: 100 mg (11/19/2018), 50 mg (12/26/2018), 60 mg (02/03/2019), 70 mg (02/24/2019), 70 mg (03/17/2019), 70 mg (04/07/2019), 70 mg (04/28/2019), 70 mg (05/19/2019) palonosetron (ALOXI) injection 0.25 mg, 0.25 mg, Intravenous,  Once, 8 of 8 cycles Administration: 0.25 mg (11/19/2018), 0.25 mg (12/26/2018), 0.25 mg (02/03/2019), 0.25 mg (02/24/2019), 0.25 mg (03/17/2019), 0.25 mg (04/07/2019), 0.25 mg (04/28/2019), 0.25 mg (05/19/2019) pegfilgrastim (NEULASTA) injection 6 mg, 6 mg, Subcutaneous, Once, 1 of 1 cycle Administration: 6 mg (11/21/2018) pegfilgrastim (NEULASTA ONPRO KIT) injection 6 mg, 6 mg, Subcutaneous, Once, 7 of 7 cycles Administration: 6 mg (12/26/2018), 6 mg (02/03/2019), 6 mg (02/24/2019), 6 mg (03/17/2019), 6 mg (04/07/2019), 6 mg (04/28/2019), 6 mg (05/19/2019) cyclophosphamide (CYTOXAN) 1,500 mg in sodium chloride 0.9 % 250 mL chemo infusion, 750 mg/m2 = 1,500 mg, Intravenous,  Once, 8 of 8 cycles Dose modification: 400 mg/m2 (original dose 750 mg/m2, Cycle 2, Reason: Provider Judgment), 500 mg/m2 (original dose 750 mg/m2, Cycle 3, Reason: Provider Judgment), 500 mg/m2 (original dose 750 mg/m2, Cycle 4, Reason: Provider Judgment, Comment: Per MD instruction), 550 mg/m2 (original dose 750 mg/m2, Cycle 4, Reason: Provider Judgment), 550 mg/m2 (original dose 750 mg/m2, Cycle 5, Reason: Provider Judgment), 550 mg/m2 (original dose 750 mg/m2, Cycle 6, Reason: Provider Judgment), 550 mg/m2 (original dose 750 mg/m2, Cycle 7, Reason: Provider  Judgment) Administration: 1,500 mg (11/19/2018), 800 mg (12/26/2018), 1,000 mg (02/03/2019), 1,100 mg (02/24/2019), 1,100 mg (03/17/2019), 1,100 mg (04/07/2019), 1,100 mg (04/28/2019), 1,100 mg (05/19/2019) fosaprepitant (EMEND) 150 mg, dexamethasone (DECADRON) 12 mg in sodium chloride 0.9 % 145 mL IVPB, , Intravenous,  Once, 8 of 8 cycles Administration:  (11/19/2018),  (12/26/2018),  (02/03/2019),  (02/24/2019),  (03/17/2019),  (04/07/2019),  (04/28/2019),  (05/19/2019)  for chemotherapy treatment.     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             (b) rituximab added to ibrutinib,  first dose 08/27/2018             (c) hepatitis B studies 08/27/2018 negative             (f) ibrutinib/rituximab discontinued after 10/02/2018 dose w/o obvious response   (6) bendamustine/rituximab started 10/22/2018, discontinued after 1 cycle with rapid progression   (7) obinutuzumab/Gaziva started 11/11/2018             (a) second dose given 11/19/2018 together with chemotherapy             (b)  third dose given 12/26/2018 and continuing every 3 weeks             (c) obinutuzumab discontinued after 09/09/2019 dose, with documentation of complete remission   (8) CH[O]P chemotherapy started 11/19/2018, completed 8 doses 05/19/2019             (a) echocardiogram on 11/07/2018 shows EF of 55-60%             (b) second cycle of CH[O]P postponed because of pandemic, given 12/26/2018, greatly reduced doses             (c) cycle 3 of CH[O]P/obinutuzumab given 02/03/2019 at increased doses             (d) cycle 4 and 5 of CH[O]P given on 6/16 and 7/7 at increased doses of 35mg /m2 and 550mg /m2             (e) Repeat echo on 04/22/2019 shows an EF of 60-65%   (9) severe immunocompromise: Marked hypogammaglobulinemia             (a) starting March 2020 she received IVIG every 8 weeks as needed             (b) starting September 2021 she receives IVIG every 3 months             (c) received Evusheld 11/21/2020   (10) 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 female Is here for continued evaluation and management of CLL/SLL.  Patient was last seen by me on 10/23/2022 and she was doing well overall, except mild congestion and bilateral leg swelling.   Patient reports she has been doing well overall since our last visit. She has started taking Mounjaro 5 mg since our last visit for weight loss and diabetes. She has never been on wright loss medication or diabetes medication. She has  lost around 8-10 lbs with Mounjaro. She regularly follows-up with her PCP regarding diabetes and Mounjaro.   She denies fever, chills, night sweats, infection issues, unexpected weight loss, new lumps/bumps, abdominal pain, back pain, chest pain, shortness of breath, fatigue, or leg swelling. She does complain of abdominal distention, which she contributes it to Maria [name].   She regularly takes Venetoclax as prescribed and she has been tolerating it well without any new or severe toxicities.    ALLERGIES:  is allergic to iohexol, compazine [prochlorperazine edisylate], contrast media  [iodinated contrast media], lactose intolerance (gi), and cephalexin.  MEDICATIONS:  Current Outpatient Medications  Medication Sig Dispense Refill   acetaminophen (TYLENOL 8 HOUR ARTHRITIS PAIN) 650 MG CR tablet Take 650 mg by mouth every 8 (eight) hours as needed for pain.     aspirin EC 81 MG tablet Take 1 tablet (81 mg total) by mouth daily. Swallow whole.     clotrimazole-betamethasone (LOTRISONE) cream Apply 1 Application topically 2 (two) times daily. 30 g 0   rosuvastatin (CRESTOR) 10 MG tablet Take 10 mg by mouth at bedtime.  11   SYNTHROID 88 MCG tablet Take 88 mcg by mouth daily before breakfast.      terbinafine (LAMISIL) 250 MG tablet 1 tablet Orally Once a day for 90 days     valACYclovir (VALTREX) 1000 MG tablet TAKE 1 TABLET (1,000 MG TOTAL) BY MOUTH DAILY. 90 tablet 6   venetoclax (VENCLEXTA) 100 MG tablet TAKE 2 TABLETS BY MOUTH DAILY. TAKE WITH FOOD AND WATER AT APPROXIMATELY THE SAME TIME EACH DAY. 60 tablet 2   No current facility-administered medications for this visit.    PHYSICAL EXAMINATION: ECOG PERFORMANCE STATUS: 1 - Symptomatic but completely ambulatory  Vitals:   10/23/22 1035  BP: (!) 140/59  Pulse: 65  Resp: 18  Temp: (!) 97.2 F (36.2 C)  SpO2: 99%   Filed Weights   10/23/22 1035  Weight: 212 lb 4.8 oz (96.3 kg)    NAD GENERAL:alert, in no acute distress and  comfortable SKIN: no acute rashes, no significant lesions EYES: conjunctiva are pink and non-injected, sclera anicteric OROPHARYNX: MMM, no exudates, no oropharyngeal erythema or ulceration NECK: supple, no JVD LYMPH:  no palpable lymphadenopathy in the cervical, axillary or inguinal regions LUNGS: clear to auscultation b/l with normal respiratory effort HEART: regular rate & rhythm ABDOMEN:  normoactive bowel sounds , non tender, not distended. Extremity: no pedal edema PSYCH: alert & oriented x 3 with fluent speech NEURO: no focal motor/sensory deficits  LABORATORY DATA:  I have reviewed the data as listed    Latest Ref Rng & Units 01/22/2023    9:36 AM 10/23/2022    9:58 AM 07/25/2022   12:40 PM  CMP  Glucose 70 - 99 mg/dL 960  454  098   BUN 8 - 23 mg/dL 16  16  15    Creatinine 0.44 - 1.00 mg/dL 1.19  1.47  8.29   Sodium 135 - 145 mmol/L 142  140  140   Potassium 3.5 - 5.1 mmol/L 4.0  4.1  4.3   Chloride 98 - 111 mmol/L 110  106  108   CO2 22 - 32 mmol/L 27  28  28    Calcium 8.9 - 10.3 mg/dL 9.6  9.6  9.4   Total Protein 6.5 - 8.1 g/dL 6.0  5.9  6.2   Total Bilirubin 0.3 - 1.2 mg/dL 0.7  0.6  0.5   Alkaline Phos 38 - 126 U/L 62  79  70   AST 15 - 41 U/L 16  18  22    ALT 0 - 44 U/L 13  19  17    .    Latest Ref Rng & Units 01/22/2023    9:36 AM 10/23/2022    9:58 AM 07/25/2022   12:40 PM  CBC  WBC 4.0 - 10.5 K/uL 4.3  3.9  4.3   Hemoglobin 12.0 - 15.0 g/dL 56.2  13.0  13.7  Hematocrit 36.0 - 46.0 % 41.8  41.6  40.7   Platelets 150 - 400 K/uL 156  157  156    . Lab Results  Component Value Date   LDH 140 01/22/2023   ASSESSMENT & PLAN:   CLL (chronic lymphocytic leukemia) (HCC) history of chronic lymphoid leukemia initially diagnosed in January 2003,    (1) treated in 2005 with cyclophosphamide, vincristine, prednisone and Rituxan   (2) treated next in 2008 with cyclophosphamide, fludarabine and rituximab, last dose November of 2008   (3) status post right  axillary lymph node biopsy 07/18/2012 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             (b) rituximab added to ibrutinib, first dose 08/27/2018             (c) hepatitis B studies 08/27/2018 negative             (f) ibrutinib/rituximab discontinued after 10/02/2018 dose w/o obvious response   (6) bendamustine/rituximab started 10/22/2018, discontinued after 1 cycle with rapid progression   (7) obinutuzumab/Gaziva started 11/11/2018             (a) second dose given 11/19/2018 together with chemotherapy             (b) third dose given 12/26/2018 and continuing every 3 weeks             (c) obinutuzumab discontinued after 09/09/2019 dose, with documentation of complete remission   (8) CH[O]P chemotherapy started 11/19/2018, completed 8 doses 05/19/2019             (a) echocardiogram on 11/07/2018 shows EF of 55-60%             (b) second cycle of CH[O]P postponed because of pandemic, given 12/26/2018, greatly reduced doses             (c) cycle 3 of CH[O]P/obinutuzumab given 02/03/2019 at increased doses             (d) cycle 4 and 5 of CH[O]P given on 6/16 and 7/7 at increased doses of 35mg /m2 and 550mg /m2             (e) Repeat echo on 04/22/2019 shows an EF of 60-65%   (9) severe immunocompromise: Marked hypogammaglobulinemia             (a) starting March 2020 she received IVIG every 8 weeks as needed             (b) starting September 2021  she receives IVIG every 3 months             (c) received Evusheld 11/21/2020   (10) 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           rivaroxaban stopped December 2021   (11) started venetoclax ramp-up 06/11/2019, progressed  to 400 mg/d dose w/o event             (a) dose reduced to 200 mg daily 08/05/2019 secondary to cytopenias             (b) PET scan December 2021 shows small minimally hypermetabolic residual periportal lymph nodes but no additional adenopathy elsewhere.  PLAN:  -Discussed lab results from today, 01/22/2023, with the patient. CBC is stable. CMP shows slightly elevated glucose levels at 132 and slightly decreased total protein level at 6.0.  -Recommend to continue follow-up with her PCP regarding diabetes and Mounjaro dosage.  -Patient has no new signs or symptoms suggestive of CLL progression at this time -No notable toxicities from the patient's current dose of venetoclax at this time -Continue venetoclax 200 mg p.o. daily.  FOLLOW UP: RTC with Dr Candise Che with portflush, labs and MD visit in 3 months   The total time spent in the appointment was 21 minutes* .  All of the patient's questions were answered with apparent satisfaction. The patient knows to call the clinic with any problems, questions or concerns.   Wyvonnia Lora MD MS AAHIVMS [name] Hematology/Oncology Physician [facility]  .*Total Encounter Time as defined by the Centers for Medicare and Medicaid Services includes, in addition to the face-to-face time of a patient visit (documented in the note above) non-face-to-face time: obtaining and reviewing outside history, ordering and reviewing medications, tests or procedures, care coordination (communications with other health care professionals or caregivers) and documentation in the medical record.   I, Ok Edwards, am acting as a Neurosurgeon for Wyvonnia Lora, MD.  .I have reviewed the above documentation for accuracy and  completeness, and I agree with the above. Johney Maine MD

## 2023-01-28 ENCOUNTER — Encounter: Payer: Self-pay | Admitting: Oncology

## 2023-02-06 ENCOUNTER — Other Ambulatory Visit (HOSPITAL_COMMUNITY): Payer: Self-pay

## 2023-02-06 ENCOUNTER — Other Ambulatory Visit: Payer: Self-pay

## 2023-02-06 DIAGNOSIS — D0439 Carcinoma in situ of skin of other parts of face: Secondary | ICD-10-CM | POA: Diagnosis not present

## 2023-02-08 ENCOUNTER — Other Ambulatory Visit (HOSPITAL_COMMUNITY): Payer: Self-pay

## 2023-02-08 ENCOUNTER — Other Ambulatory Visit: Payer: Self-pay

## 2023-02-09 ENCOUNTER — Other Ambulatory Visit (HOSPITAL_COMMUNITY): Payer: Self-pay

## 2023-02-12 ENCOUNTER — Other Ambulatory Visit (HOSPITAL_COMMUNITY): Payer: Self-pay

## 2023-02-18 DIAGNOSIS — L820 Inflamed seborrheic keratosis: Secondary | ICD-10-CM | POA: Diagnosis not present

## 2023-02-18 DIAGNOSIS — Z789 Other specified health status: Secondary | ICD-10-CM | POA: Diagnosis not present

## 2023-02-18 DIAGNOSIS — D225 Melanocytic nevi of trunk: Secondary | ICD-10-CM | POA: Diagnosis not present

## 2023-02-18 DIAGNOSIS — C44329 Squamous cell carcinoma of skin of other parts of face: Secondary | ICD-10-CM | POA: Diagnosis not present

## 2023-02-18 DIAGNOSIS — Z85828 Personal history of other malignant neoplasm of skin: Secondary | ICD-10-CM | POA: Diagnosis not present

## 2023-02-18 DIAGNOSIS — L821 Other seborrheic keratosis: Secondary | ICD-10-CM | POA: Diagnosis not present

## 2023-02-18 DIAGNOSIS — Z08 Encounter for follow-up examination after completed treatment for malignant neoplasm: Secondary | ICD-10-CM | POA: Diagnosis not present

## 2023-02-18 DIAGNOSIS — L538 Other specified erythematous conditions: Secondary | ICD-10-CM | POA: Diagnosis not present

## 2023-02-18 DIAGNOSIS — L814 Other melanin hyperpigmentation: Secondary | ICD-10-CM | POA: Diagnosis not present

## 2023-03-06 ENCOUNTER — Other Ambulatory Visit (HOSPITAL_COMMUNITY): Payer: Self-pay

## 2023-03-08 DIAGNOSIS — D0439 Carcinoma in situ of skin of other parts of face: Secondary | ICD-10-CM | POA: Diagnosis not present

## 2023-03-12 ENCOUNTER — Other Ambulatory Visit: Payer: Self-pay | Admitting: Hematology

## 2023-03-12 ENCOUNTER — Other Ambulatory Visit (HOSPITAL_COMMUNITY): Payer: Self-pay

## 2023-03-12 DIAGNOSIS — C9110 Chronic lymphocytic leukemia of B-cell type not having achieved remission: Secondary | ICD-10-CM

## 2023-03-12 MED ORDER — VENETOCLAX 100 MG PO TABS
ORAL_TABLET | ORAL | 2 refills | Status: DC
Start: 2023-03-12 — End: 2023-05-28
  Filled 2023-03-12 – 2023-03-13 (×2): qty 56, 28d supply, fill #0
  Filled 2023-04-05: qty 56, 28d supply, fill #1
  Filled 2023-05-07: qty 56, 28d supply, fill #2

## 2023-03-13 ENCOUNTER — Other Ambulatory Visit: Payer: Self-pay

## 2023-03-13 ENCOUNTER — Other Ambulatory Visit (HOSPITAL_COMMUNITY): Payer: Self-pay

## 2023-03-21 ENCOUNTER — Other Ambulatory Visit (HOSPITAL_COMMUNITY): Payer: Self-pay

## 2023-03-21 ENCOUNTER — Other Ambulatory Visit: Payer: Self-pay

## 2023-03-21 ENCOUNTER — Encounter: Payer: Self-pay | Admitting: Oncology

## 2023-03-21 DIAGNOSIS — C9110 Chronic lymphocytic leukemia of B-cell type not having achieved remission: Secondary | ICD-10-CM | POA: Diagnosis not present

## 2023-03-21 DIAGNOSIS — D849 Immunodeficiency, unspecified: Secondary | ICD-10-CM | POA: Diagnosis not present

## 2023-03-21 DIAGNOSIS — G622 Polyneuropathy due to other toxic agents: Secondary | ICD-10-CM | POA: Diagnosis not present

## 2023-03-21 DIAGNOSIS — E1149 Type 2 diabetes mellitus with other diabetic neurological complication: Secondary | ICD-10-CM | POA: Diagnosis not present

## 2023-03-21 DIAGNOSIS — E669 Obesity, unspecified: Secondary | ICD-10-CM | POA: Diagnosis not present

## 2023-03-21 DIAGNOSIS — C44320 Squamous cell carcinoma of skin of unspecified parts of face: Secondary | ICD-10-CM | POA: Diagnosis not present

## 2023-03-21 DIAGNOSIS — I700 Atherosclerosis of aorta: Secondary | ICD-10-CM | POA: Diagnosis not present

## 2023-03-21 DIAGNOSIS — I10 Essential (primary) hypertension: Secondary | ICD-10-CM | POA: Diagnosis not present

## 2023-03-21 MED ORDER — MOUNJARO 7.5 MG/0.5ML ~~LOC~~ SOAJ
7.5000 mg | SUBCUTANEOUS | 4 refills | Status: DC
  Filled 2023-03-21: qty 2, 28d supply, fill #0
  Filled 2023-04-22: qty 2, 28d supply, fill #1
  Filled 2023-05-24: qty 2, 28d supply, fill #2
  Filled 2023-06-22: qty 2, 28d supply, fill #3

## 2023-03-22 ENCOUNTER — Other Ambulatory Visit: Payer: Self-pay

## 2023-04-05 ENCOUNTER — Other Ambulatory Visit (HOSPITAL_COMMUNITY): Payer: Self-pay

## 2023-04-06 ENCOUNTER — Other Ambulatory Visit (HOSPITAL_COMMUNITY): Payer: Self-pay

## 2023-04-08 ENCOUNTER — Other Ambulatory Visit (HOSPITAL_COMMUNITY): Payer: Self-pay

## 2023-04-09 ENCOUNTER — Other Ambulatory Visit: Payer: Self-pay

## 2023-04-09 ENCOUNTER — Other Ambulatory Visit (HOSPITAL_COMMUNITY): Payer: Self-pay

## 2023-04-09 MED ORDER — ROSUVASTATIN CALCIUM 10 MG PO TABS
10.0000 mg | ORAL_TABLET | Freq: Every day | ORAL | 3 refills | Status: DC
  Filled 2023-04-09: qty 90, 90d supply, fill #0
  Filled 2023-07-03: qty 90, 90d supply, fill #1
  Filled 2023-11-10: qty 90, 90d supply, fill #2
  Filled 2024-02-04: qty 90, 90d supply, fill #3

## 2023-04-09 MED ORDER — SYNTHROID 88 MCG PO TABS
88.0000 ug | ORAL_TABLET | Freq: Every day | ORAL | 3 refills | Status: DC
  Filled 2023-04-09: qty 90, 90d supply, fill #0
  Filled 2023-07-03: qty 90, 90d supply, fill #1

## 2023-04-10 ENCOUNTER — Other Ambulatory Visit: Payer: Self-pay

## 2023-04-11 ENCOUNTER — Other Ambulatory Visit (HOSPITAL_COMMUNITY): Payer: Self-pay

## 2023-04-22 ENCOUNTER — Other Ambulatory Visit: Payer: Self-pay

## 2023-04-22 DIAGNOSIS — C9110 Chronic lymphocytic leukemia of B-cell type not having achieved remission: Secondary | ICD-10-CM

## 2023-04-23 ENCOUNTER — Other Ambulatory Visit: Payer: Self-pay

## 2023-04-23 ENCOUNTER — Inpatient Hospital Stay: Payer: Medicare PPO

## 2023-04-23 ENCOUNTER — Inpatient Hospital Stay: Payer: Medicare PPO | Attending: Hematology | Admitting: Hematology

## 2023-04-23 VITALS — BP 148/70 | HR 62 | Temp 97.8°F | Resp 20 | Wt 198.2 lb

## 2023-04-23 DIAGNOSIS — C9110 Chronic lymphocytic leukemia of B-cell type not having achieved remission: Secondary | ICD-10-CM

## 2023-04-23 DIAGNOSIS — Z86718 Personal history of other venous thrombosis and embolism: Secondary | ICD-10-CM | POA: Diagnosis not present

## 2023-04-23 DIAGNOSIS — C9111 Chronic lymphocytic leukemia of B-cell type in remission: Secondary | ICD-10-CM | POA: Insufficient documentation

## 2023-04-23 DIAGNOSIS — D801 Nonfamilial hypogammaglobulinemia: Secondary | ICD-10-CM | POA: Insufficient documentation

## 2023-04-23 DIAGNOSIS — Z79899 Other long term (current) drug therapy: Secondary | ICD-10-CM | POA: Insufficient documentation

## 2023-04-23 DIAGNOSIS — Z95828 Presence of other vascular implants and grafts: Secondary | ICD-10-CM

## 2023-04-23 LAB — CMP (CANCER CENTER ONLY)
ALT: 11 U/L (ref 0–44)
AST: 15 U/L (ref 15–41)
Albumin: 4.2 g/dL (ref 3.5–5.0)
Alkaline Phosphatase: 69 U/L (ref 38–126)
Anion gap: 5 (ref 5–15)
BUN: 15 mg/dL (ref 8–23)
CO2: 29 mmol/L (ref 22–32)
Calcium: 9.4 mg/dL (ref 8.9–10.3)
Chloride: 108 mmol/L (ref 98–111)
Creatinine: 1.03 mg/dL — ABNORMAL HIGH (ref 0.44–1.00)
GFR, Estimated: 58 mL/min — ABNORMAL LOW (ref >60)
Glucose, Bld: 103 mg/dL — ABNORMAL HIGH (ref 70–99)
Potassium: 3.8 mmol/L (ref 3.5–5.1)
Sodium: 142 mmol/L (ref 135–145)
Total Bilirubin: 0.8 mg/dL (ref 0.3–1.2)
Total Protein: 5.9 g/dL — ABNORMAL LOW (ref 6.5–8.1)

## 2023-04-23 LAB — CBC WITH DIFFERENTIAL (CANCER CENTER ONLY)
Abs Immature Granulocytes: 0.02 10*3/uL (ref 0.00–0.07)
Basophils Absolute: 0 10*3/uL (ref 0.0–0.1)
Basophils Relative: 1 %
Eosinophils Absolute: 0.2 10*3/uL (ref 0.0–0.5)
Eosinophils Relative: 4 %
HCT: 43.1 % (ref 36.0–46.0)
Hemoglobin: 14.3 g/dL (ref 12.0–15.0)
Immature Granulocytes: 0 %
Lymphocytes Relative: 27 %
Lymphs Abs: 1.4 10*3/uL (ref 0.7–4.0)
MCH: 28.5 pg (ref 26.0–34.0)
MCHC: 33.2 g/dL (ref 30.0–36.0)
MCV: 86 fL (ref 80.0–100.0)
Monocytes Absolute: 0.5 10*3/uL (ref 0.1–1.0)
Monocytes Relative: 9 %
Neutro Abs: 3.2 10*3/uL (ref 1.7–7.7)
Neutrophils Relative %: 59 %
Platelet Count: 167 10*3/uL (ref 150–400)
RBC: 5.01 MIL/uL (ref 3.87–5.11)
RDW: 13.6 % (ref 11.5–15.5)
WBC Count: 5.3 10*3/uL (ref 4.0–10.5)
nRBC: 0 % (ref 0.0–0.2)

## 2023-04-23 LAB — FERRITIN: Ferritin: 15 ng/mL (ref 11–307)

## 2023-04-23 LAB — LACTATE DEHYDROGENASE: LDH: 140 U/L (ref 98–192)

## 2023-04-23 LAB — IRON AND IRON BINDING CAPACITY (CC-WL,HP ONLY)
Iron: 103 ug/dL (ref 28–170)
Saturation Ratios: 27 % (ref 10.4–31.8)
TIBC: 388 ug/dL (ref 250–450)
UIBC: 285 ug/dL (ref 148–442)

## 2023-04-23 MED ORDER — SODIUM CHLORIDE 0.9% FLUSH
10.0000 mL | Freq: Once | INTRAVENOUS | Status: AC
  Administered 2023-04-23: 10 mL

## 2023-04-23 MED ORDER — HEPARIN SOD (PORK) LOCK FLUSH 100 UNIT/ML IV SOLN
500.0000 [IU] | Freq: Once | INTRAVENOUS | Status: AC
  Administered 2023-04-23: 500 [IU]

## 2023-04-23 NOTE — Progress Notes (Signed)
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  SUMMARY OF ONCOLOGIC HISTORY: Oncology History  CLL (chronic lymphocytic leukemia) (HCC)  07/24/2012 Initial Diagnosis   CLL (chronic lymphocytic leukemia) (HCC)   08/27/2018 - 10/02/2018 Chemotherapy   The patient had riTUXimab (RITUXAN) 100 mg in sodium chloride 0.9 % 250 mL (0.3846 mg/mL) infusion, 100 mg (100 % of original dose 100 mg), Intravenous,  Once, 4 of 4 cycles Dose modification: 100 mg (original dose 100 mg, Cycle 1, Reason: Provider Judgment), 375 mg/m2 (original dose 100 mg, Cycle 2, Reason: Provider Judgment) Administration: 100 mg (08/27/2018), 800 mg (08/28/2018), 800 mg (09/04/2018), 800 mg (09/11/2018) riTUXimab (RITUXAN) 800 mg in sodium chloride 0.9 % 170 mL infusion, 375 mg/m2 = 800 mg (100 % of original dose 375 mg/m2), Intravenous,  Once, 3 of 5 cycles Dose modification: 375 mg/m2 (original dose 375 mg/m2, Cycle 5) Administration: 800 mg (09/18/2018), 800 mg (09/25/2018), 800 mg (10/02/2018)  for chemotherapy treatment.    10/22/2018 - 10/23/2018 Chemotherapy   The patient had dexamethasone (DECADRON) 4 MG tablet, 8 mg, Oral, Daily, 1 of 1 cycle, Start date: 11/03/2018, End date: 11/03/2018 palonosetron (ALOXI) injection 0.25 mg, 0.25 mg, Intravenous,  Once, 1 of 4 cycles Administration: 0.25 mg (10/22/2018) riTUXimab (RITUXAN) 1,000 mg in sodium chloride 0.9 % 250 mL (2.8571 mg/mL) infusion, 500 mg/m2 = 1,000 mg (100 % of original dose 500 mg/m2), Intravenous,  Once, 1 of 1 cycle Dose modification: 500 mg/m2 (original dose 500 mg/m2, Cycle 1, Reason: Provider  Judgment) bendamustine (BENDEKA) 125 mg in sodium chloride 0.9 % 50 mL (2.2727 mg/mL) chemo infusion, 70 mg/m2 = 125 mg (100 % of original dose 70 mg/m2), Intravenous,  Once, 1 of 4 cycles Dose modification: 70 mg/m2 (original dose 70 mg/m2, Cycle 1, Reason: Provider Judgment) Administration: 125 mg (10/22/2018), 125 mg (10/23/2018) riTUXimab (RITUXAN) 1,000 mg in sodium chloride 0.9 % 150 mL infusion, 500 mg/m2 = 1,000 mg (100 % of original dose 500 mg/m2), Intravenous,  Once, 1 of 1 cycle Dose modification: 500 mg/m2 (original dose 500 mg/m2, Cycle 1) Administration: 1,000 mg (10/22/2018) ofatumumab (ARZERRA) 300 mg in sodium chloride 0.9 % 985 mL chemo infusion, 300 mg (100 % of original dose 300 mg), Intravenous,  Once, 0 of 3 cycles Dose modification: 300 mg (original dose 300 mg, Cycle 2), 1,000 mg (original dose 1,000 mg, Cycle 2)  for chemotherapy treatment.    11/11/2018 - 09/09/2019 Chemotherapy   The patient had obinutuzumab (GAZYVA) 100 mg in sodium chloride 0.9 % 100 mL (0.9615 mg/mL) chemo infusion, 100 mg (100 % of original dose 100 mg), Intravenous, Once, 15 of 16 cycles Dose modification: 100 mg (original dose 100 mg, Cycle 1, Reason: Provider Judgment), 900 mg (original dose 900 mg, Cycle 2, Reason: Provider Judgment) Administration: 100 mg (11/11/2018), 900 mg (11/12/2018), 1,000 mg (11/19/2018), 1,000 mg (12/26/2018), 1,000 mg (02/03/2019), 1,000 mg (03/17/2019), 1,000 mg (04/07/2019), 1,000 mg (04/28/2019), 1,000 mg (02/25/2019), 1,000 mg (05/19/2019), 1,000 mg (06/12/2019), 1,000 mg (07/03/2019), 1,000 mg (07/24/2019), 1,000 mg (08/13/2019), 1,000 mg (09/09/2019)  for chemotherapy treatment.    11/19/2018 - 05/19/2019 Chemotherapy   The patient had DOXOrubicin (ADRIAMYCIN) chemo injection 100 mg, 50 mg/m2 = 100  mg, Intravenous,  Once, 8 of 8 cycles Dose modification: 25 mg/m2 (original dose 50 mg/m2, Cycle 2, Reason: Provider Judgment), 75 mg (original dose 50 mg/m2, Cycle 3, Reason: Provider  Judgment), 40 mg/m2 (original dose 50 mg/m2, Cycle 3, Reason: Provider Judgment), 30 mg/m2 (original dose 50 mg/m2, Cycle 4, Reason: Provider Judgment, Comment: Per MD instruction), 35 mg/m2 (original dose 50 mg/m2, Cycle 4, Reason: Provider Judgment), 35 mg/m2 (original dose 50 mg/m2, Cycle 5, Reason: Provider Judgment), 35 mg/m2 (original dose 50 mg/m2, Cycle 6, Reason: Provider Judgment), 35 mg/m2 (original dose 50 mg/m2, Cycle 7, Reason: Provider Judgment) Administration: 100 mg (11/19/2018), 50 mg (12/26/2018), 60 mg (02/03/2019), 70 mg (02/24/2019), 70 mg (03/17/2019), 70 mg (04/07/2019), 70 mg (04/28/2019), 70 mg (05/19/2019) palonosetron (ALOXI) injection 0.25 mg, 0.25 mg, Intravenous,  Once, 8 of 8 cycles Administration: 0.25 mg (11/19/2018), 0.25 mg (12/26/2018), 0.25 mg (02/03/2019), 0.25 mg (02/24/2019), 0.25 mg (03/17/2019), 0.25 mg (04/07/2019), 0.25 mg (04/28/2019), 0.25 mg (05/19/2019) pegfilgrastim (NEULASTA) injection 6 mg, 6 mg, Subcutaneous, Once, 1 of 1 cycle Administration: 6 mg (11/21/2018) pegfilgrastim (NEULASTA ONPRO KIT) injection 6 mg, 6 mg, Subcutaneous, Once, 7 of 7 cycles Administration: 6 mg (12/26/2018), 6 mg (02/03/2019), 6 mg (02/24/2019), 6 mg (03/17/2019), 6 mg (04/07/2019), 6 mg (04/28/2019), 6 mg (05/19/2019) cyclophosphamide (CYTOXAN) 1,500 mg in sodium chloride 0.9 % 250 mL chemo infusion, 750 mg/m2 = 1,500 mg, Intravenous,  Once, 8 of 8 cycles Dose modification: 400 mg/m2 (original dose 750 mg/m2, Cycle 2, Reason: Provider Judgment), 500 mg/m2 (original dose 750 mg/m2, Cycle 3, Reason: Provider Judgment), 500 mg/m2 (original dose 750 mg/m2, Cycle 4, Reason: Provider Judgment, Comment: Per MD instruction), 550 mg/m2 (original dose 750 mg/m2, Cycle 4, Reason: Provider Judgment), 550 mg/m2 (original dose 750 mg/m2, Cycle 5, Reason: Provider Judgment), 550 mg/m2 (original dose 750 mg/m2, Cycle 6, Reason: Provider Judgment), 550 mg/m2 (original dose 750 mg/m2, Cycle 7, Reason: Provider  Judgment) Administration: 1,500 mg (11/19/2018), 800 mg (12/26/2018), 1,000 mg (02/03/2019), 1,100 mg (02/24/2019), 1,100 mg (03/17/2019), 1,100 mg (04/07/2019), 1,100 mg (04/28/2019), 1,100 mg (05/19/2019) fosaprepitant (EMEND) 150 mg, dexamethasone (DECADRON) 12 mg in sodium chloride 0.9 % 145 mL IVPB, , Intravenous,  Once, 8 of 8 cycles Administration:  (11/19/2018),  (12/26/2018),  (02/03/2019),  (02/24/2019),  (03/17/2019),  (04/07/2019),  (04/28/2019),  (05/19/2019)  for chemotherapy treatment.     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             (b) rituximab added to ibrutinib,  first dose 08/27/2018             (c) hepatitis B studies 08/27/2018 negative             (f) ibrutinib/rituximab discontinued after 10/02/2018 dose w/o obvious response   (6) bendamustine/rituximab started 10/22/2018, discontinued after 1 cycle with rapid progression   (7) obinutuzumab/Gaziva started 11/11/2018             (a) second dose given 11/19/2018 together with chemotherapy             (b)  third dose given 12/26/2018 and continuing every 3 weeks             (c) obinutuzumab discontinued after 09/09/2019 dose, with documentation of complete remission   (8) CH[O]P chemotherapy started 11/19/2018, completed 8 doses 05/19/2019             (a) echocardiogram on 11/07/2018 shows EF of 55-60%             (b) second cycle of CH[O]P postponed because of pandemic, given 12/26/2018, greatly reduced doses             (c) cycle 3 of CH[O]P/obinutuzumab given 02/03/2019 at increased doses             (d) cycle 4 and 5 of CH[O]P given on 6/16 and 7/7 at increased doses of 35mg /m2 and 550mg /m2             (e) Repeat echo on 04/22/2019 shows an EF of 60-65%   (9) severe immunocompromise: Marked hypogammaglobulinemia             (a) starting March 2020 she received IVIG every 8 weeks as needed             (b) starting September 2021 she receives IVIG every 3 months             (c) received Evusheld 11/21/2020   (10) 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 female Is here for continued evaluation and management of CLL/SLL.  Patient was last seen by me on 01/22/2023 and she complained of mild abdominal distention, which she contributed it to [facility].   Patient notes she has been doing well overall since our last visit. She complains of memory problems, such as forgetting names and forgetting words. She notes that this fairly new issue. She is being socially active and has been  sleeping well. Patient complains of fatigue after starting Mounjaro.   Patient notes she has appointment with PCP in 1-2 months where she will get her thyroid levels checked.   She is still taking Mounjaro and she has lost around 20 lbs. Patient's goal is to lose 40 lbs from her baseline weight. She notes Mounjaro 7.5 mg/0/5 ML pen has been causing her mild diarrhea.   She denies any new infection issues, fever, chills, UTI, urinary symptoms, unexpected weight loss,  chest pain, abdominal pain, new lumps/bumps, back pain, or leg swelling.   She regularly takes Venetoclax as prescribed and she has been tolerating it well without any new or severe toxicities.    Patient has been regularly taking vitamin-C supplement, but denies B-complex.    ALLERGIES:  is allergic to iohexol, compazine [prochlorperazine edisylate], contrast media  [iodinated contrast media], lactose intolerance (gi), and cephalexin.  MEDICATIONS:  Current Outpatient Medications  Medication Sig Dispense Refill   acetaminophen (TYLENOL 8 HOUR ARTHRITIS PAIN) 650 MG CR tablet Take 650 mg by mouth every 8 (eight) hours as needed for pain.     cholecalciferol (VITAMIN D3) 25 MCG (1000 UNIT) tablet Take 5,000 Units by mouth daily.     SYNTHROID 88 MCG tablet Take 1 tablet (88 mcg total) by mouth daily. 90 tablet 3   rosuvastatin (CRESTOR) 10 MG tablet Take 10 mg by mouth at bedtime.  11   rosuvastatin (CRESTOR) 10 MG tablet Take 1 tablet (10 mg total) by mouth at bedtime. 90 tablet 3   SYNTHROID 88 MCG tablet Take 88 mcg by mouth daily before breakfast.      tirzepatide (MOUNJARO) 5 MG/0.5ML Pen Inject 5mg  under the skin once weekly. 2 mL 3   tirzepatide (MOUNJARO) 7.5 MG/0.5ML Pen Inject 7.5 mg into the skin once a week. 2 mL 4   venetoclax (VENCLEXTA) 100 MG tablet TAKE 2 TABLETS BY MOUTH DAILY. TAKE WITH FOOD AND WATER AT APPROXIMATELY THE SAME TIME EACH DAY. 56 tablet 2   No current facility-administered medications for this  visit.    PHYSICAL EXAMINATION: ECOG PERFORMANCE STATUS: 1 - Symptomatic but completely ambulatory  Vitals:   04/23/23 1230  BP: (!) 148/70  Pulse: 62  Resp: 20  Temp: 97.8 F (36.6 C)  SpO2: 99%   Filed Weights   04/23/23 1230  Weight: 198 lb 3.2 oz (89.9 kg)    NAD GENERAL:alert, in no acute distress and comfortable SKIN: no acute rashes, no significant lesions EYES: conjunctiva are pink and non-injected, sclera anicteric OROPHARYNX: MMM, no exudates, no oropharyngeal erythema or ulceration NECK: supple, no JVD LYMPH:  no palpable lymphadenopathy in the cervical, axillary or inguinal regions LUNGS: clear to auscultation b/l with normal respiratory effort HEART: regular rate & rhythm ABDOMEN:  normoactive bowel sounds , non tender, not distended. Extremity: no pedal edema PSYCH: alert & oriented x 3 with fluent speech NEURO: no focal motor/sensory deficits  LABORATORY DATA:  I have reviewed the data as listed    Latest Ref Rng & Units 04/23/2023   11:18 AM 01/22/2023    9:36 AM 10/23/2022    9:58 AM  CMP  Glucose 70 - 99 mg/dL 161  096  045   BUN 8 - 23 mg/dL 15  16  16    Creatinine 0.44 - 1.00 mg/dL 4.09  8.11  9.14   Sodium 135 - 145 mmol/L 142  142  140   Potassium 3.5 - 5.1 mmol/L 3.8  4.0  4.1   Chloride 98 - 111 mmol/L 108  110  106   CO2 22 - 32 mmol/L 29  27  28    Calcium 8.9 - 10.3 mg/dL 9.4  9.6  9.6   Total Protein 6.5 - 8.1 g/dL 5.9  6.0  5.9   Total Bilirubin 0.3 - 1.2 mg/dL 0.8  0.7  0.6   Alkaline Phos 38 - 126 U/L 69  62  79   AST 15 - 41 U/L 15  16  18   ALT 0 - 44 U/L 11  13  19    .    Latest Ref Rng & Units 04/23/2023   11:18 AM 01/22/2023    9:36 AM 10/23/2022    9:58 AM  CBC  WBC 4.0 - 10.5 K/uL 5.3  4.3  3.9   Hemoglobin 12.0 - 15.0 g/dL 32.4  40.1  02.7   Hematocrit 36.0 - 46.0 % 43.1  41.8  41.6   Platelets 150 - 400 K/uL 167  156  157    . 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             (b) rituximab added to ibrutinib, first dose 08/27/2018             (c) hepatitis B studies 08/27/2018 negative             (f) ibrutinib/rituximab discontinued after 10/02/2018 dose w/o obvious response   (6) bendamustine/rituximab started 10/22/2018, discontinued after 1 cycle with rapid progression   (7) obinutuzumab/Gaziva started 11/11/2018             (a) second dose given 11/19/2018 together with chemotherapy             (b) third dose given 12/26/2018 and continuing every 3 weeks             (c) obinutuzumab discontinued after 09/09/2019 dose, with documentation of complete remission   (8) CH[O]P chemotherapy started 11/19/2018, completed 8 doses 05/19/2019             (a) echocardiogram on 11/07/2018 shows EF of 55-60%             (b) second cycle of CH[O]P postponed because of pandemic, given 12/26/2018, greatly reduced doses             (c) cycle 3 of CH[O]P/obinutuzumab given 02/03/2019 at increased doses             (d) cycle 4  and 5 of CH[O]P given on 6/16 and 7/7 at increased doses of 35mg /m2 and 550mg /m2             (e) Repeat echo on 04/22/2019 shows an EF of 60-65%   (9) severe immunocompromise: Marked hypogammaglobulinemia             (a) starting March 2020 she received IVIG every 8 weeks as needed             (  b) starting September 2021 she receives IVIG every 3 months             (c) received Evusheld 11/21/2020   (10) 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  PLAN: -Discussed lab results from today, 04/23/2023, with the patient. CBC is stable. CMP shows slightly elevated creatinine at 1.03 and decreased total protein level at 5.9.  -Recommend to continue vitamin-C supplement and to start B-complex.  -Recommend to follow-up with PCP and get Thyroid levels checked due to memory concerns.  -recommend to receive the influenza vaccine and RSV vaccine.  -Answered all of patient's questions.  -Recommend to follow-up with PCP regarding memory issues.  -Patient has no new signs or symptoms suggestive of CLL progression at this time -No notable toxicities from the patient's current dose of venetoclax at this time -Continue venetoclax 200 mg p.o. daily.  FOLLOW UP: RTC with Dr Candise Che with portflush, labs and MD visit in 3 months   The total time spent in the appointment was 30 minutes* .  All of the patient's questions were answered with apparent satisfaction. The patient knows to call the clinic with any problems, questions or concerns.   Wyvonnia Lora MD MS AAHIVMS [facility] Hematology/Oncology Physician [facility]  .*Total Encounter Time as defined by the Centers for Medicare and Medicaid Services includes, in addition to the face-to-face time of a patient visit (documented in the note above) non-face-to-face time: obtaining and reviewing outside history, ordering and reviewing medications, tests or procedures, care coordination (communications with other health care professionals or caregivers) and documentation in the medical record.   I, Ok Edwards, acting as a Neurosurgeon for Wyvonnia Lora, MD., have documented all relevant documentation on the behalf of Wyvonnia Lora, MD, as directed by  Wyvonnia Lora, MD while in the presence of Wyvonnia Lora, MD.  .I have reviewed the above documentation for accuracy and completeness, and I agree with the above. Johney Maine MD

## 2023-04-29 ENCOUNTER — Encounter: Payer: Self-pay | Admitting: Oncology

## 2023-05-07 ENCOUNTER — Other Ambulatory Visit (HOSPITAL_COMMUNITY): Payer: Self-pay

## 2023-05-07 ENCOUNTER — Other Ambulatory Visit: Payer: Self-pay

## 2023-05-25 ENCOUNTER — Other Ambulatory Visit (HOSPITAL_COMMUNITY): Payer: Self-pay

## 2023-05-27 ENCOUNTER — Other Ambulatory Visit (HOSPITAL_COMMUNITY): Payer: Self-pay

## 2023-05-28 ENCOUNTER — Other Ambulatory Visit: Payer: Self-pay | Admitting: Hematology

## 2023-05-28 ENCOUNTER — Other Ambulatory Visit (HOSPITAL_COMMUNITY): Payer: Self-pay

## 2023-05-28 DIAGNOSIS — C9110 Chronic lymphocytic leukemia of B-cell type not having achieved remission: Secondary | ICD-10-CM

## 2023-05-29 ENCOUNTER — Other Ambulatory Visit: Payer: Self-pay

## 2023-05-29 MED ORDER — VENETOCLAX 100 MG PO TABS
ORAL_TABLET | ORAL | 2 refills | Status: DC
Start: 2023-05-29 — End: 2023-08-27
  Filled 2023-05-29: qty 56, 28d supply, fill #0
  Filled 2023-07-02: qty 56, 28d supply, fill #1
  Filled 2023-07-25: qty 56, 28d supply, fill #2

## 2023-06-21 DIAGNOSIS — Z0000 Encounter for general adult medical examination without abnormal findings: Secondary | ICD-10-CM | POA: Diagnosis not present

## 2023-06-21 DIAGNOSIS — Z1389 Encounter for screening for other disorder: Secondary | ICD-10-CM | POA: Diagnosis not present

## 2023-06-21 DIAGNOSIS — M79644 Pain in right finger(s): Secondary | ICD-10-CM | POA: Diagnosis not present

## 2023-06-21 DIAGNOSIS — L089 Local infection of the skin and subcutaneous tissue, unspecified: Secondary | ICD-10-CM | POA: Diagnosis not present

## 2023-06-21 DIAGNOSIS — E1149 Type 2 diabetes mellitus with other diabetic neurological complication: Secondary | ICD-10-CM | POA: Diagnosis not present

## 2023-06-21 DIAGNOSIS — D849 Immunodeficiency, unspecified: Secondary | ICD-10-CM | POA: Diagnosis not present

## 2023-06-21 DIAGNOSIS — I10 Essential (primary) hypertension: Secondary | ICD-10-CM | POA: Diagnosis not present

## 2023-06-24 ENCOUNTER — Other Ambulatory Visit: Payer: Self-pay

## 2023-06-28 ENCOUNTER — Other Ambulatory Visit: Payer: Self-pay

## 2023-07-02 ENCOUNTER — Other Ambulatory Visit (HOSPITAL_COMMUNITY): Payer: Self-pay

## 2023-07-02 NOTE — Progress Notes (Signed)
Specialty Pharmacy Ongoing Clinical Assessment Note  Maria [name] is a 72 y.o. female who is being followed by the specialty pharmacy service for RxSp Oncology   Patient's specialty medication(s) reviewed today: Venetoclax   Missed doses in the last 4 weeks: 0   Patient/Caregiver did not have any additional questions or concerns.   Therapeutic benefit summary: Patient is achieving benefit   Adverse events/side effects summary: No adverse events/side effects   Patient's therapy is appropriate to: Continue    Goals Addressed             This Visit's Progress    Achieve or maintain remission       Patient is on track. Patient will maintain adherence.  Dr. Candise Che reported no new signs or symptoms of CLL progression at OV on 04/23/23.        Follow up:  6 months  Servando Snare Specialty Pharmacist

## 2023-07-02 NOTE — Progress Notes (Signed)
Specialty Pharmacy Refill Coordination Note  Maria [name] is a 73 y.o. female contacted today regarding refills of specialty medication(s) Venetoclax   Patient requested Delivery   Delivery date: 07/09/23   Verified address: [address]  [name] [phone]   Medication will be filled on 07/08/23.

## 2023-07-03 ENCOUNTER — Other Ambulatory Visit: Payer: Self-pay

## 2023-07-09 DIAGNOSIS — E1149 Type 2 diabetes mellitus with other diabetic neurological complication: Secondary | ICD-10-CM | POA: Diagnosis not present

## 2023-07-09 DIAGNOSIS — I10 Essential (primary) hypertension: Secondary | ICD-10-CM | POA: Diagnosis not present

## 2023-07-09 DIAGNOSIS — E039 Hypothyroidism, unspecified: Secondary | ICD-10-CM | POA: Diagnosis not present

## 2023-07-09 DIAGNOSIS — E782 Mixed hyperlipidemia: Secondary | ICD-10-CM | POA: Diagnosis not present

## 2023-07-15 ENCOUNTER — Other Ambulatory Visit (HOSPITAL_BASED_OUTPATIENT_CLINIC_OR_DEPARTMENT_OTHER): Payer: Self-pay

## 2023-07-15 DIAGNOSIS — Z86718 Personal history of other venous thrombosis and embolism: Secondary | ICD-10-CM | POA: Diagnosis not present

## 2023-07-15 DIAGNOSIS — R82998 Other abnormal findings in urine: Secondary | ICD-10-CM | POA: Diagnosis not present

## 2023-07-15 DIAGNOSIS — R600 Localized edema: Secondary | ICD-10-CM | POA: Diagnosis not present

## 2023-07-15 DIAGNOSIS — E669 Obesity, unspecified: Secondary | ICD-10-CM | POA: Diagnosis not present

## 2023-07-15 DIAGNOSIS — N1831 Chronic kidney disease, stage 3a: Secondary | ICD-10-CM | POA: Diagnosis not present

## 2023-07-15 DIAGNOSIS — Z1339 Encounter for screening examination for other mental health and behavioral disorders: Secondary | ICD-10-CM | POA: Diagnosis not present

## 2023-07-15 DIAGNOSIS — Z1331 Encounter for screening for depression: Secondary | ICD-10-CM | POA: Diagnosis not present

## 2023-07-15 DIAGNOSIS — G622 Polyneuropathy due to other toxic agents: Secondary | ICD-10-CM | POA: Diagnosis not present

## 2023-07-15 DIAGNOSIS — Z0000 Encounter for general adult medical examination without abnormal findings: Secondary | ICD-10-CM | POA: Diagnosis not present

## 2023-07-15 DIAGNOSIS — Z23 Encounter for immunization: Secondary | ICD-10-CM | POA: Diagnosis not present

## 2023-07-15 DIAGNOSIS — D849 Immunodeficiency, unspecified: Secondary | ICD-10-CM | POA: Diagnosis not present

## 2023-07-15 DIAGNOSIS — C9110 Chronic lymphocytic leukemia of B-cell type not having achieved remission: Secondary | ICD-10-CM | POA: Diagnosis not present

## 2023-07-15 DIAGNOSIS — E1149 Type 2 diabetes mellitus with other diabetic neurological complication: Secondary | ICD-10-CM | POA: Diagnosis not present

## 2023-07-15 DIAGNOSIS — I129 Hypertensive chronic kidney disease with stage 1 through stage 4 chronic kidney disease, or unspecified chronic kidney disease: Secondary | ICD-10-CM | POA: Diagnosis not present

## 2023-07-15 MED ORDER — FUROSEMIDE 20 MG PO TABS
ORAL_TABLET | ORAL | 3 refills | Status: AC
  Filled 2023-07-15 – 2023-07-16 (×2): qty 30, 30d supply, fill #0
  Filled 2023-08-11: qty 30, 30d supply, fill #1
  Filled 2023-09-30: qty 30, 30d supply, fill #2
  Filled 2024-01-18: qty 30, 30d supply, fill #3

## 2023-07-15 MED ORDER — MOUNJARO 10 MG/0.5ML ~~LOC~~ SOAJ
10.0000 mg | SUBCUTANEOUS | 3 refills | Status: DC
  Filled 2023-07-15 – 2023-07-25 (×2): qty 2, 28d supply, fill #0
  Filled 2023-09-27: qty 2, 28d supply, fill #1

## 2023-07-16 ENCOUNTER — Other Ambulatory Visit: Payer: Self-pay | Admitting: Internal Medicine

## 2023-07-16 ENCOUNTER — Other Ambulatory Visit (HOSPITAL_BASED_OUTPATIENT_CLINIC_OR_DEPARTMENT_OTHER): Payer: Self-pay

## 2023-07-16 ENCOUNTER — Other Ambulatory Visit (HOSPITAL_COMMUNITY): Payer: Self-pay

## 2023-07-16 DIAGNOSIS — Z1231 Encounter for screening mammogram for malignant neoplasm of breast: Secondary | ICD-10-CM

## 2023-07-18 ENCOUNTER — Other Ambulatory Visit (HOSPITAL_COMMUNITY): Payer: Self-pay

## 2023-07-22 ENCOUNTER — Other Ambulatory Visit: Payer: Self-pay

## 2023-07-22 DIAGNOSIS — C9110 Chronic lymphocytic leukemia of B-cell type not having achieved remission: Secondary | ICD-10-CM

## 2023-07-23 ENCOUNTER — Inpatient Hospital Stay: Payer: Medicare PPO | Admitting: Hematology

## 2023-07-23 ENCOUNTER — Inpatient Hospital Stay: Payer: Medicare PPO | Attending: Hematology

## 2023-07-23 VITALS — BP 149/56 | HR 78 | Temp 97.3°F | Resp 18 | Wt 191.7 lb

## 2023-07-23 DIAGNOSIS — Z79899 Other long term (current) drug therapy: Secondary | ICD-10-CM | POA: Insufficient documentation

## 2023-07-23 DIAGNOSIS — C9110 Chronic lymphocytic leukemia of B-cell type not having achieved remission: Secondary | ICD-10-CM

## 2023-07-23 DIAGNOSIS — D801 Nonfamilial hypogammaglobulinemia: Secondary | ICD-10-CM

## 2023-07-23 DIAGNOSIS — Z95828 Presence of other vascular implants and grafts: Secondary | ICD-10-CM

## 2023-07-23 LAB — CMP (CANCER CENTER ONLY)
ALT: 16 U/L (ref 0–44)
AST: 17 U/L (ref 15–41)
Albumin: 4.3 g/dL (ref 3.5–5.0)
Alkaline Phosphatase: 69 U/L (ref 38–126)
Anion gap: 5 (ref 5–15)
BUN: 18 mg/dL (ref 8–23)
CO2: 30 mmol/L (ref 22–32)
Calcium: 10 mg/dL (ref 8.9–10.3)
Chloride: 105 mmol/L (ref 98–111)
Creatinine: 1.2 mg/dL — ABNORMAL HIGH (ref 0.44–1.00)
GFR, Estimated: 48 mL/min — ABNORMAL LOW (ref >60)
Glucose, Bld: 113 mg/dL — ABNORMAL HIGH (ref 70–99)
Potassium: 3.7 mmol/L (ref 3.5–5.1)
Sodium: 140 mmol/L (ref 135–145)
Total Bilirubin: 0.7 mg/dL (ref <1.2)
Total Protein: 6.2 g/dL — ABNORMAL LOW (ref 6.5–8.1)

## 2023-07-23 LAB — CBC WITH DIFFERENTIAL (CANCER CENTER ONLY)
Abs Immature Granulocytes: 0.02 10*3/uL (ref 0.00–0.07)
Basophils Absolute: 0 10*3/uL (ref 0.0–0.1)
Basophils Relative: 1 %
Eosinophils Absolute: 0 10*3/uL (ref 0.0–0.5)
Eosinophils Relative: 1 %
HCT: 44.9 % (ref 36.0–46.0)
Hemoglobin: 14.6 g/dL (ref 12.0–15.0)
Immature Granulocytes: 0 %
Lymphocytes Relative: 24 %
Lymphs Abs: 1.4 10*3/uL (ref 0.7–4.0)
MCH: 28.6 pg (ref 26.0–34.0)
MCHC: 32.5 g/dL (ref 30.0–36.0)
MCV: 87.9 fL (ref 80.0–100.0)
Monocytes Absolute: 0.6 10*3/uL (ref 0.1–1.0)
Monocytes Relative: 10 %
Neutro Abs: 3.9 10*3/uL (ref 1.7–7.7)
Neutrophils Relative %: 64 %
Platelet Count: 182 10*3/uL (ref 150–400)
RBC: 5.11 MIL/uL (ref 3.87–5.11)
RDW: 13.6 % (ref 11.5–15.5)
WBC Count: 6.1 10*3/uL (ref 4.0–10.5)
nRBC: 0 % (ref 0.0–0.2)

## 2023-07-23 LAB — LACTATE DEHYDROGENASE: LDH: 150 U/L (ref 98–192)

## 2023-07-23 LAB — IRON AND IRON BINDING CAPACITY (CC-WL,HP ONLY)
Iron: 63 ug/dL (ref 28–170)
Saturation Ratios: 15 % (ref 10.4–31.8)
TIBC: 431 ug/dL (ref 250–450)
UIBC: 368 ug/dL (ref 148–442)

## 2023-07-23 LAB — FERRITIN: Ferritin: 15 ng/mL (ref 11–307)

## 2023-07-23 MED ORDER — HEPARIN SOD (PORK) LOCK FLUSH 100 UNIT/ML IV SOLN
500.0000 [IU] | Freq: Once | INTRAVENOUS | Status: AC
  Administered 2023-07-23: 500 [IU]

## 2023-07-23 MED ORDER — SODIUM CHLORIDE 0.9% FLUSH
10.0000 mL | Freq: Once | INTRAVENOUS | Status: AC
  Administered 2023-07-23: 10 mL

## 2023-07-23 NOTE — Progress Notes (Signed)
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  SUMMARY OF ONCOLOGIC HISTORY: Oncology History  CLL (chronic lymphocytic leukemia) (HCC)  07/24/2012 Initial Diagnosis   CLL (chronic lymphocytic leukemia) (HCC)   08/27/2018 - 10/02/2018 Chemotherapy   The patient had riTUXimab (RITUXAN) 100 mg in sodium chloride 0.9 % 250 mL (0.3846 mg/mL) infusion, 100 mg (100 % of original dose 100 mg), Intravenous,  Once, 4 of 4 cycles Dose modification: 100 mg (original dose 100 mg, Cycle 1, Reason: Provider Judgment), 375 mg/m2 (original dose 100 mg, Cycle 2, Reason: Provider Judgment) Administration: 100 mg (08/27/2018), 800 mg (08/28/2018), 800 mg (09/04/2018), 800 mg (09/11/2018) riTUXimab (RITUXAN) 800 mg in sodium chloride 0.9 % 170 mL infusion, 375 mg/m2 = 800 mg (100 % of original dose 375 mg/m2), Intravenous,  Once, 3 of 5 cycles Dose modification: 375 mg/m2 (original dose 375 mg/m2, Cycle 5) Administration: 800 mg (09/18/2018), 800 mg (09/25/2018), 800 mg (10/02/2018)  for chemotherapy treatment.    10/22/2018 - 10/23/2018 Chemotherapy   The patient had dexamethasone (DECADRON) 4 MG tablet, 8 mg, Oral, Daily, 1 of 1 cycle, Start date: 11/03/2018, End date: 11/03/2018 palonosetron (ALOXI) injection 0.25 mg, 0.25 mg, Intravenous,  Once, 1 of 4 cycles Administration: 0.25 mg (10/22/2018) riTUXimab (RITUXAN) 1,000 mg in sodium chloride 0.9 % 250 mL (2.8571 mg/mL) infusion, 500 mg/m2 = 1,000 mg (100 % of original dose 500 mg/m2), Intravenous,  Once, 1 of 1 cycle Dose modification: 500 mg/m2 (original dose 500 mg/m2, Cycle 1, Reason: Provider  Judgment) bendamustine (BENDEKA) 125 mg in sodium chloride 0.9 % 50 mL (2.2727 mg/mL) chemo infusion, 70 mg/m2 = 125 mg (100 % of original dose 70 mg/m2), Intravenous,  Once, 1 of 4 cycles Dose modification: 70 mg/m2 (original dose 70 mg/m2, Cycle 1, Reason: Provider Judgment) Administration: 125 mg (10/22/2018), 125 mg (10/23/2018) riTUXimab (RITUXAN) 1,000 mg in sodium chloride 0.9 % 150 mL infusion, 500 mg/m2 = 1,000 mg (100 % of original dose 500 mg/m2), Intravenous,  Once, 1 of 1 cycle Dose modification: 500 mg/m2 (original dose 500 mg/m2, Cycle 1) Administration: 1,000 mg (10/22/2018) ofatumumab (ARZERRA) 300 mg in sodium chloride 0.9 % 985 mL chemo infusion, 300 mg (100 % of original dose 300 mg), Intravenous,  Once, 0 of 3 cycles Dose modification: 300 mg (original dose 300 mg, Cycle 2), 1,000 mg (original dose 1,000 mg, Cycle 2)  for chemotherapy treatment.    11/11/2018 - 09/09/2019 Chemotherapy   The patient had obinutuzumab (GAZYVA) 100 mg in sodium chloride 0.9 % 100 mL (0.9615 mg/mL) chemo infusion, 100 mg (100 % of original dose 100 mg), Intravenous, Once, 15 of 16 cycles Dose modification: 100 mg (original dose 100 mg, Cycle 1, Reason: Provider Judgment), 900 mg (original dose 900 mg, Cycle 2, Reason: Provider Judgment) Administration: 100 mg (11/11/2018), 900 mg (11/12/2018), 1,000 mg (11/19/2018), 1,000 mg (12/26/2018), 1,000 mg (02/03/2019), 1,000 mg (03/17/2019), 1,000 mg (04/07/2019), 1,000 mg (04/28/2019), 1,000 mg (02/25/2019), 1,000 mg (05/19/2019), 1,000 mg (06/12/2019), 1,000 mg (07/03/2019), 1,000 mg (07/24/2019), 1,000 mg (08/13/2019), 1,000 mg (09/09/2019)  for chemotherapy treatment.    11/19/2018 - 05/19/2019 Chemotherapy   The patient had DOXOrubicin (ADRIAMYCIN) chemo injection 100 mg, 50 mg/m2 = 100  mg, Intravenous,  Once, 8 of 8 cycles Dose modification: 25 mg/m2 (original dose 50 mg/m2, Cycle 2, Reason: Provider Judgment), 75 mg (original dose 50 mg/m2, Cycle 3, Reason: Provider  Judgment), 40 mg/m2 (original dose 50 mg/m2, Cycle 3, Reason: Provider Judgment), 30 mg/m2 (original dose 50 mg/m2, Cycle 4, Reason: Provider Judgment, Comment: Per MD instruction), 35 mg/m2 (original dose 50 mg/m2, Cycle 4, Reason: Provider Judgment), 35 mg/m2 (original dose 50 mg/m2, Cycle 5, Reason: Provider Judgment), 35 mg/m2 (original dose 50 mg/m2, Cycle 6, Reason: Provider Judgment), 35 mg/m2 (original dose 50 mg/m2, Cycle 7, Reason: Provider Judgment) Administration: 100 mg (11/19/2018), 50 mg (12/26/2018), 60 mg (02/03/2019), 70 mg (02/24/2019), 70 mg (03/17/2019), 70 mg (04/07/2019), 70 mg (04/28/2019), 70 mg (05/19/2019) palonosetron (ALOXI) injection 0.25 mg, 0.25 mg, Intravenous,  Once, 8 of 8 cycles Administration: 0.25 mg (11/19/2018), 0.25 mg (12/26/2018), 0.25 mg (02/03/2019), 0.25 mg (02/24/2019), 0.25 mg (03/17/2019), 0.25 mg (04/07/2019), 0.25 mg (04/28/2019), 0.25 mg (05/19/2019) pegfilgrastim (NEULASTA) injection 6 mg, 6 mg, Subcutaneous, Once, 1 of 1 cycle Administration: 6 mg (11/21/2018) pegfilgrastim (NEULASTA ONPRO KIT) injection 6 mg, 6 mg, Subcutaneous, Once, 7 of 7 cycles Administration: 6 mg (12/26/2018), 6 mg (02/03/2019), 6 mg (02/24/2019), 6 mg (03/17/2019), 6 mg (04/07/2019), 6 mg (04/28/2019), 6 mg (05/19/2019) cyclophosphamide (CYTOXAN) 1,500 mg in sodium chloride 0.9 % 250 mL chemo infusion, 750 mg/m2 = 1,500 mg, Intravenous,  Once, 8 of 8 cycles Dose modification: 400 mg/m2 (original dose 750 mg/m2, Cycle 2, Reason: Provider Judgment), 500 mg/m2 (original dose 750 mg/m2, Cycle 3, Reason: Provider Judgment), 500 mg/m2 (original dose 750 mg/m2, Cycle 4, Reason: Provider Judgment, Comment: Per MD instruction), 550 mg/m2 (original dose 750 mg/m2, Cycle 4, Reason: Provider Judgment), 550 mg/m2 (original dose 750 mg/m2, Cycle 5, Reason: Provider Judgment), 550 mg/m2 (original dose 750 mg/m2, Cycle 6, Reason: Provider Judgment), 550 mg/m2 (original dose 750 mg/m2, Cycle 7, Reason: Provider  Judgment) Administration: 1,500 mg (11/19/2018), 800 mg (12/26/2018), 1,000 mg (02/03/2019), 1,100 mg (02/24/2019), 1,100 mg (03/17/2019), 1,100 mg (04/07/2019), 1,100 mg (04/28/2019), 1,100 mg (05/19/2019) fosaprepitant (EMEND) 150 mg, dexamethasone (DECADRON) 12 mg in sodium chloride 0.9 % 145 mL IVPB, , Intravenous,  Once, 8 of 8 cycles Administration:  (11/19/2018),  (12/26/2018),  (02/03/2019),  (02/24/2019),  (03/17/2019),  (04/07/2019),  (04/28/2019),  (05/19/2019)  for chemotherapy treatment.     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             (b) rituximab added to ibrutinib,  first dose 08/27/2018             (c) hepatitis B studies 08/27/2018 negative             (f) ibrutinib/rituximab discontinued after 10/02/2018 dose w/o obvious response   (6) bendamustine/rituximab started 10/22/2018, discontinued after 1 cycle with rapid progression   (7) obinutuzumab/Gaziva started 11/11/2018             (a) second dose given 11/19/2018 together with chemotherapy             (b)  third dose given 12/26/2018 and continuing every 3 weeks             (c) obinutuzumab discontinued after 09/09/2019 dose, with documentation of complete remission   (8) CH[O]P chemotherapy started 11/19/2018, completed 8 doses 05/19/2019             (a) echocardiogram on 11/07/2018 shows EF of 55-60%             (b) second cycle of CH[O]P postponed because of pandemic, given 12/26/2018, greatly reduced doses             (c) cycle 3 of CH[O]P/obinutuzumab given 02/03/2019 at increased doses             (d) cycle 4 and 5 of CH[O]P given on 6/16 and 7/7 at increased doses of 35mg /m2 and 550mg /m2             (e) Repeat echo on 04/22/2019 shows an EF of 60-65%   (9) severe immunocompromise: Marked hypogammaglobulinemia             (a) starting March 2020 she received IVIG every 8 weeks as needed             (b) starting September 2021 she receives IVIG every 3 months             (c) received Evusheld 11/21/2020   (10) 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 female Is here for continued evaluation and management of CLL/SLL.  Patient was last seen by me on 04/23/2023 and she complained of memory problems, and fatigue and mild diarrhea after Mounjaro.  Patient notes she has been doing well overall since our last visit. She denies any new infection issues, fever, chills, skin rashes, abnormal bowel movement, night sweats, unexpected weight loss, bone pain, chest pain, back pain,  abnormal bleeding, or leg swelling.   She does complain of memory problem, but she notes it is not bothersome. She has issues remembering names.   Patient notes that her Mounjaro dosage has been increased to 10 mg. Currently, she is one 7.5 mg. She has been losing weight with Mounjaro. She recently had a visit with her PCP and she was prescribed Lasix for fluid retention. Patient notes she did have mild diarrhea when she first started [facility].   She has been tolerating her  current dose of Venetoclax well without any new or severe toxicities.  She denies taking acid suppressant.   Patient is UTD with influenza vaccine and RSV vaccine. She is going to receive COVID-19 Booster in 2-3 weeks.    ALLERGIES:  is allergic to iohexol, compazine [prochlorperazine edisylate], contrast media  [iodinated contrast media], lactose intolerance (gi), and cephalexin.  MEDICATIONS:  Current Outpatient Medications  Medication Sig Dispense Refill   acetaminophen (TYLENOL 8 HOUR ARTHRITIS PAIN) 650 MG CR tablet Take 650 mg by mouth every 8 (eight) hours as needed for pain.     cholecalciferol (VITAMIN D3) 25 MCG (1000 UNIT) tablet Take 5,000 Units by mouth daily.     furosemide (LASIX) 20 MG tablet Take 1 tablet by mouth once daily 30 tablet 3   SYNTHROID 88 MCG tablet Take 1 tablet (88 mcg total) by mouth daily. 90 tablet 3   rosuvastatin (CRESTOR) 10 MG tablet Take 10 mg by mouth at bedtime.  11   rosuvastatin (CRESTOR) 10 MG tablet Take 1 tablet (10 mg total) by mouth at bedtime. 90 tablet 3   SYNTHROID 88 MCG tablet Take 88 mcg by mouth daily before breakfast.      tirzepatide (MOUNJARO) 10 MG/0.5ML Pen Inject 10 mg into the skin once a week. 2 mL 3   tirzepatide (MOUNJARO) 5 MG/0.5ML Pen Inject 5mg  under the skin once weekly. 2 mL 3   tirzepatide (MOUNJARO) 7.5 MG/0.5ML Pen Inject 7.5 mg into the skin once a week. 2 mL 4   venetoclax (VENCLEXTA) 100 MG tablet TAKE 2 TABLETS BY MOUTH DAILY. TAKE WITH FOOD  AND WATER AT APPROXIMATELY THE SAME TIME EACH DAY. 56 tablet 2   No current facility-administered medications for this visit.    PHYSICAL EXAMINATION: ECOG PERFORMANCE STATUS: 1 - Symptomatic but completely ambulatory  Vitals:   07/23/23 1245  BP: (!) 149/56  Pulse: 78  Resp: 18  Temp: (!) 97.3 F (36.3 C)  SpO2: 99%    Filed Weights   07/23/23 1245  Weight: 191 lb 11.2 oz (87 kg)     NAD GENERAL:alert, in no acute distress and comfortable SKIN: no acute rashes, no significant lesions EYES: conjunctiva are pink and non-injected, sclera anicteric OROPHARYNX: MMM, no exudates, no oropharyngeal erythema or ulceration NECK: supple, no JVD LYMPH:  no palpable lymphadenopathy in the cervical, axillary or inguinal regions LUNGS: clear to auscultation b/l with normal respiratory effort HEART: regular rate & rhythm ABDOMEN:  normoactive bowel sounds , non tender, not distended. Extremity: no pedal edema PSYCH: alert & oriented x 3 with fluent speech NEURO: no focal motor/sensory deficits  LABORATORY DATA:  I have reviewed the data as listed     Latest Ref Rng & Units 07/23/2023   12:08 PM 04/23/2023   11:18 AM 01/22/2023    9:36 AM  CMP  Glucose 70 - 99 mg/dL 161  096  045   BUN 8 - 23 mg/dL 18  15  16    Creatinine 0.44 - 1.00 mg/dL 4.09  8.11  9.14   Sodium 135 - 145 mmol/L 140  142  142   Potassium 3.5 - 5.1 mmol/L 3.7  3.8  4.0   Chloride 98 - 111 mmol/L 105  108  110   CO2 22 - 32 mmol/L 30  29  27    Calcium 8.9 - 10.3 mg/dL 78.2  9.4  9.6   Total Protein 6.5 - 8.1 g/dL 6.2  5.9  6.0   Total Bilirubin <1.2  mg/dL 0.7  0.8  0.7   Alkaline Phos 38 - 126 U/L 69  69  62   AST 15 - 41 U/L 17  15  16    ALT 0 - 44 U/L 16  11  13    .    Latest Ref Rng & Units 07/23/2023   12:08 PM 04/23/2023   11:18 AM 01/22/2023    9:36 AM  CBC  WBC 4.0 - 10.5 K/uL 6.1  5.3  4.3   Hemoglobin 12.0 - 15.0 g/dL 16.1  09.6  04.5   Hematocrit 36.0 - 46.0 % 44.9  43.1  41.8   Platelets  150 - 400 K/uL 182  167  156    . Lab Results  Component Value Date   IRON 63 07/23/2023   TIBC 431 07/23/2023   IRONPCTSAT 15 07/23/2023   (Iron and TIBC)  Lab Results  Component Value Date   FERRITIN 15 07/23/2023    Lab Results  Component Value Date   LDH 150 07/23/2023   ASSESSMENT & PLAN:   CLL (chronic lymphocytic leukemia) (HCC) history of chronic lymphoid leukemia initially diagnosed in January 2003,    (1) treated in 2005 with cyclophosphamide, vincristine, prednisone and Rituxan   (2) treated next in 2008 with cyclophosphamide, fludarabine and rituximab, last dose November of 2008   (3) status post right axillary lymph node biopsy 07/18/2012 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             (b) rituximab added to ibrutinib, first dose 08/27/2018             (c) hepatitis B studies 08/27/2018 negative             (f) ibrutinib/rituximab discontinued after 10/02/2018 dose w/o obvious response   (6) bendamustine/rituximab started 10/22/2018, discontinued after 1 cycle with rapid progression   (7) obinutuzumab/Gaziva started 11/11/2018             (a) second dose given 11/19/2018 together with chemotherapy             (b) third dose given 12/26/2018 and continuing every 3 weeks             (c) obinutuzumab discontinued after 09/09/2019 dose, with documentation of complete remission   (8) CH[O]P chemotherapy started  11/19/2018, completed 8 doses 05/19/2019             (a) echocardiogram on 11/07/2018 shows EF of 55-60%             (b) second cycle of CH[O]P postponed because of pandemic, given 12/26/2018, greatly reduced doses             (c) cycle 3 of CH[O]P/obinutuzumab given 02/03/2019 at increased doses             (d) cycle 4 and 5 of  CH[O]P given on 6/16 and 7/7 at increased doses of 35mg /m2 and 550mg /m2             (e) Repeat echo on 04/22/2019 shows an EF of 60-65%   (9) severe immunocompromise: Marked hypogammaglobulinemia             (a) starting March 2020 she received IVIG every 8 weeks as needed             (b) starting September 2021 she receives IVIG every 3 months             (c) received Evusheld 11/21/2020   (10) 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  PLAN: -Discussed lab results from today, 07/23/2023, in detail with the patient. CBC is stable. CMP shows elevated creatinine of 1.20, but stable overall. Iron saturation is slightly low at 15%. Ferritin level 15 -Continue vitamin-C supplement and to start B-complex.  -recommend to receive the influenza vaccine and RSV vaccine.  -Answered all of patient's questions.  -Patient has no new signs or symptoms suggestive of CLL progression at this time -No notable toxicities from the patient's current dose of venetoclax at this time -Continue venetoclax 200 mg p.o. daily. -Recommend to stay well-hydrated.   FOLLOW UP:  RTC with Dr Candise Che with portflush, labs and MD visit in 4 months   The total time spent in the appointment was 21 minutes* .  All of the  patient's questions were answered with apparent satisfaction. The patient knows to call the clinic with any problems, questions or concerns.   Wyvonnia Lora MD MS AAHIVMS [facility] Hematology/Oncology Physician [facility]  .*Total Encounter Time as defined by the Centers for Medicare and Medicaid Services includes, in addition to the face-to-face time of a patient visit (documented in the note above) non-face-to-face time: obtaining and reviewing outside history, ordering and reviewing medications, tests or procedures, care coordination (communications with other health care professionals or caregivers) and documentation in the medical record.   I,Param Shah,acting as a Neurosurgeon for Wyvonnia Lora, MD.,have documented all relevant documentation on the behalf of Wyvonnia Lora, MD,as directed by  Wyvonnia Lora, MD while in the presence of Wyvonnia Lora, MD.  .I have reviewed the above documentation for accuracy and completeness, and I agree with the above. Johney Maine MD

## 2023-07-25 ENCOUNTER — Other Ambulatory Visit: Payer: Self-pay

## 2023-07-25 ENCOUNTER — Telehealth: Payer: Self-pay | Admitting: Hematology

## 2023-07-25 NOTE — Progress Notes (Signed)
Specialty Pharmacy Refill Coordination Note  LISLE DINWIDDIE is a 72 y.o. female contacted today regarding refills of specialty medication(s) Venetoclax   Patient requested Delivery   Delivery date: 08/01/23   Verified address: [address]  [name] [address]   Medication will be filled on 07/31/23.

## 2023-07-25 NOTE — Telephone Encounter (Signed)
Patient is aware of scheduled appointment times/dates

## 2023-07-29 ENCOUNTER — Encounter: Payer: Self-pay | Admitting: Oncology

## 2023-07-31 ENCOUNTER — Telehealth: Payer: Self-pay | Admitting: Pharmacy Technician

## 2023-07-31 ENCOUNTER — Encounter: Payer: Self-pay | Admitting: Oncology

## 2023-07-31 ENCOUNTER — Other Ambulatory Visit (HOSPITAL_COMMUNITY): Payer: Self-pay

## 2023-07-31 NOTE — Telephone Encounter (Signed)
Oral Oncology Patient Advocate Encounter  Was successful in securing patient a $8,000 grant from [employer] to provide copayment coverage for Venclexta.  This will keep the out of pocket expense at $0.     [employer] ID: [id number]  I have spoken with the patient.   The billing information is as follows and has been shared with [facility].    RxBin: [id number] PCN: [id number] Member ID: [id number] Group ID: [id number] Dates of Eligibility: 07/01/23 through 06/29/24  Fund:  CLL  Jinger Neighbors, CPhT-Adv Oncology Pharmacy Patient Advocate [facility] Direct Number: [phone]  Fax: [phone]

## 2023-08-12 ENCOUNTER — Other Ambulatory Visit: Payer: Self-pay

## 2023-08-21 DIAGNOSIS — Z08 Encounter for follow-up examination after completed treatment for malignant neoplasm: Secondary | ICD-10-CM | POA: Diagnosis not present

## 2023-08-21 DIAGNOSIS — L821 Other seborrheic keratosis: Secondary | ICD-10-CM | POA: Diagnosis not present

## 2023-08-21 DIAGNOSIS — L820 Inflamed seborrheic keratosis: Secondary | ICD-10-CM | POA: Diagnosis not present

## 2023-08-21 DIAGNOSIS — Z85828 Personal history of other malignant neoplasm of skin: Secondary | ICD-10-CM | POA: Diagnosis not present

## 2023-08-21 DIAGNOSIS — D225 Melanocytic nevi of trunk: Secondary | ICD-10-CM | POA: Diagnosis not present

## 2023-08-21 DIAGNOSIS — R208 Other disturbances of skin sensation: Secondary | ICD-10-CM | POA: Diagnosis not present

## 2023-08-21 DIAGNOSIS — L2989 Other pruritus: Secondary | ICD-10-CM | POA: Diagnosis not present

## 2023-08-21 DIAGNOSIS — L814 Other melanin hyperpigmentation: Secondary | ICD-10-CM | POA: Diagnosis not present

## 2023-08-21 DIAGNOSIS — L538 Other specified erythematous conditions: Secondary | ICD-10-CM | POA: Diagnosis not present

## 2023-08-27 ENCOUNTER — Other Ambulatory Visit: Payer: Self-pay | Admitting: Hematology

## 2023-08-27 ENCOUNTER — Other Ambulatory Visit: Payer: Self-pay

## 2023-08-27 ENCOUNTER — Other Ambulatory Visit (HOSPITAL_COMMUNITY): Payer: Self-pay

## 2023-08-27 DIAGNOSIS — C9110 Chronic lymphocytic leukemia of B-cell type not having achieved remission: Secondary | ICD-10-CM

## 2023-08-27 MED ORDER — VENETOCLAX 100 MG PO TABS
ORAL_TABLET | ORAL | 2 refills | Status: DC
  Filled 2023-08-27: qty 56, 28d supply, fill #0
  Filled 2023-09-24: qty 56, 28d supply, fill #1
  Filled 2023-10-15: qty 56, 28d supply, fill #2

## 2023-08-27 NOTE — Progress Notes (Signed)
Specialty Pharmacy Refill Coordination Note  Maria [name] is a 72 y.o. female contacted today regarding refills of specialty medication(s) Venetoclax [name])   Patient requested Delivery   Delivery date: 08/30/23   Verified address: [address]  [name] [id number]   Medication will be filled on 12.19.24.  Refill request pending

## 2023-09-24 ENCOUNTER — Other Ambulatory Visit: Payer: Self-pay

## 2023-09-24 NOTE — Progress Notes (Signed)
 Specialty Pharmacy Refill Coordination Note  Maria [name] is a 73 y.o. female contacted today regarding refills of specialty medication(s) Venetoclax  (VENCLEXTA )   Patient requested Delivery   Delivery date: 09/27/23   Verified address: [address]  [place] [address]   Medication will be filled on 09/26/23.

## 2023-09-26 ENCOUNTER — Other Ambulatory Visit: Payer: Self-pay

## 2023-09-28 ENCOUNTER — Other Ambulatory Visit (HOSPITAL_COMMUNITY): Payer: Self-pay

## 2023-09-30 ENCOUNTER — Other Ambulatory Visit: Payer: Self-pay

## 2023-09-30 ENCOUNTER — Other Ambulatory Visit (HOSPITAL_COMMUNITY): Payer: Self-pay

## 2023-10-15 ENCOUNTER — Other Ambulatory Visit (HOSPITAL_COMMUNITY): Payer: Self-pay

## 2023-10-15 NOTE — Progress Notes (Signed)
 Specialty Pharmacy Refill Coordination Note  Maria [name] is a 73 y.o. female contacted today regarding refills of specialty medication(s) Venetoclax  (VENCLEXTA )   Patient requested Delivery   Delivery date: 10/24/23   Verified address: [address]  [place] [address]   Medication will be filled on 10/23/23.

## 2023-10-23 ENCOUNTER — Other Ambulatory Visit: Payer: Self-pay

## 2023-11-04 ENCOUNTER — Encounter: Payer: Self-pay | Admitting: Oncology

## 2023-11-04 ENCOUNTER — Other Ambulatory Visit: Payer: Self-pay

## 2023-11-04 ENCOUNTER — Other Ambulatory Visit (HOSPITAL_COMMUNITY): Payer: Self-pay

## 2023-11-04 MED ORDER — SYNTHROID 88 MCG PO TABS
88.0000 ug | ORAL_TABLET | Freq: Every day | ORAL | 3 refills | Status: AC
  Filled 2023-11-04: qty 90, 90d supply, fill #0
  Filled 2024-01-18: qty 90, 90d supply, fill #1
  Filled 2024-04-26: qty 90, 90d supply, fill #2
  Filled 2024-07-30: qty 90, 90d supply, fill #3
  Filled 2024-07-30: qty 90, 90d supply, fill #0

## 2023-11-11 ENCOUNTER — Other Ambulatory Visit: Payer: Self-pay

## 2023-11-14 ENCOUNTER — Other Ambulatory Visit (HOSPITAL_COMMUNITY): Payer: Self-pay

## 2023-11-18 ENCOUNTER — Other Ambulatory Visit (HOSPITAL_COMMUNITY): Payer: Self-pay

## 2023-11-18 ENCOUNTER — Other Ambulatory Visit: Payer: Self-pay

## 2023-11-18 ENCOUNTER — Other Ambulatory Visit: Payer: Self-pay | Admitting: Hematology

## 2023-11-18 DIAGNOSIS — C9110 Chronic lymphocytic leukemia of B-cell type not having achieved remission: Secondary | ICD-10-CM

## 2023-11-18 MED ORDER — VENETOCLAX 100 MG PO TABS
ORAL_TABLET | ORAL | 2 refills | Status: DC
  Filled 2023-11-18: qty 56, 28d supply, fill #0
  Filled 2023-12-10: qty 56, 28d supply, fill #1
  Filled 2024-01-09: qty 56, 28d supply, fill #2

## 2023-11-18 NOTE — Progress Notes (Signed)
 Specialty Pharmacy Refill Coordination Note  Maria [name] is a 73 y.o. female contacted today regarding refills of specialty medication(s) No data recorded Prolia.  Patient requested No data recorded Delivery  Delivery date: No data recorded 11/22/23  Verified address: No data recorded [address] [place],  [place] [address]  Medication will be filled on 11/21/23.   This fill date is pending response to refill request from provider. Patient is aware and if they have not received fill by intended date, they must follow up with pharmacy.

## 2023-11-28 ENCOUNTER — Other Ambulatory Visit: Payer: Self-pay

## 2023-11-28 DIAGNOSIS — C9110 Chronic lymphocytic leukemia of B-cell type not having achieved remission: Secondary | ICD-10-CM

## 2023-11-29 ENCOUNTER — Inpatient Hospital Stay: Payer: Medicare PPO

## 2023-11-29 ENCOUNTER — Inpatient Hospital Stay: Payer: Medicare PPO | Attending: Hematology | Admitting: Hematology

## 2023-11-29 VITALS — BP 131/54 | HR 62 | Temp 97.7°F | Resp 16 | Ht 66.0 in | Wt 200.1 lb

## 2023-11-29 DIAGNOSIS — D801 Nonfamilial hypogammaglobulinemia: Secondary | ICD-10-CM | POA: Insufficient documentation

## 2023-11-29 DIAGNOSIS — C9110 Chronic lymphocytic leukemia of B-cell type not having achieved remission: Secondary | ICD-10-CM | POA: Insufficient documentation

## 2023-11-29 DIAGNOSIS — Z79899 Other long term (current) drug therapy: Secondary | ICD-10-CM | POA: Diagnosis not present

## 2023-11-29 DIAGNOSIS — Z86718 Personal history of other venous thrombosis and embolism: Secondary | ICD-10-CM | POA: Diagnosis not present

## 2023-11-29 DIAGNOSIS — Z95828 Presence of other vascular implants and grafts: Secondary | ICD-10-CM

## 2023-11-29 LAB — CMP (CANCER CENTER ONLY)
ALT: 14 U/L (ref 0–44)
AST: 15 U/L (ref 15–41)
Albumin: 4.2 g/dL (ref 3.5–5.0)
Alkaline Phosphatase: 68 U/L (ref 38–126)
Anion gap: 6 (ref 5–15)
BUN: 20 mg/dL (ref 8–23)
CO2: 28 mmol/L (ref 22–32)
Calcium: 9.9 mg/dL (ref 8.9–10.3)
Chloride: 107 mmol/L (ref 98–111)
Creatinine: 1 mg/dL (ref 0.44–1.00)
GFR, Estimated: 60 mL/min — ABNORMAL LOW (ref >60)
Glucose, Bld: 128 mg/dL — ABNORMAL HIGH (ref 70–99)
Potassium: 4 mmol/L (ref 3.5–5.1)
Sodium: 141 mmol/L (ref 135–145)
Total Bilirubin: 0.7 mg/dL (ref 0.0–1.2)
Total Protein: 6 g/dL — ABNORMAL LOW (ref 6.5–8.1)

## 2023-11-29 LAB — IRON AND IRON BINDING CAPACITY (CC-WL,HP ONLY)
Iron: 81 ug/dL (ref 28–170)
Saturation Ratios: 18 % (ref 10.4–31.8)
TIBC: 451 ug/dL — ABNORMAL HIGH (ref 250–450)
UIBC: 370 ug/dL (ref 148–442)

## 2023-11-29 LAB — CBC WITH DIFFERENTIAL (CANCER CENTER ONLY)
Abs Immature Granulocytes: 0.01 10*3/uL (ref 0.00–0.07)
Basophils Absolute: 0 10*3/uL (ref 0.0–0.1)
Basophils Relative: 0 %
Eosinophils Absolute: 0.1 10*3/uL (ref 0.0–0.5)
Eosinophils Relative: 1 %
HCT: 42.6 % (ref 36.0–46.0)
Hemoglobin: 13.8 g/dL (ref 12.0–15.0)
Immature Granulocytes: 0 %
Lymphocytes Relative: 26 %
Lymphs Abs: 1.4 10*3/uL (ref 0.7–4.0)
MCH: 28.4 pg (ref 26.0–34.0)
MCHC: 32.4 g/dL (ref 30.0–36.0)
MCV: 87.7 fL (ref 80.0–100.0)
Monocytes Absolute: 0.6 10*3/uL (ref 0.1–1.0)
Monocytes Relative: 11 %
Neutro Abs: 3.3 10*3/uL (ref 1.7–7.7)
Neutrophils Relative %: 62 %
Platelet Count: 152 10*3/uL (ref 150–400)
RBC: 4.86 MIL/uL (ref 3.87–5.11)
RDW: 13.8 % (ref 11.5–15.5)
WBC Count: 5.3 10*3/uL (ref 4.0–10.5)
nRBC: 0 % (ref 0.0–0.2)

## 2023-11-29 LAB — FERRITIN: Ferritin: 15 ng/mL (ref 11–307)

## 2023-11-29 LAB — LACTATE DEHYDROGENASE: LDH: 142 U/L (ref 98–192)

## 2023-11-29 MED ORDER — SODIUM CHLORIDE 0.9% FLUSH
10.0000 mL | Freq: Once | INTRAVENOUS | Status: AC
  Administered 2023-11-29: 10 mL

## 2023-11-29 MED ORDER — HEPARIN SOD (PORK) LOCK FLUSH 100 UNIT/ML IV SOLN
500.0000 [IU] | Freq: Once | INTRAVENOUS | Status: AC
Start: 2023-11-29 — End: 2023-11-29
  Administered 2023-11-29: 500 [IU]

## 2023-11-29 NOTE — Progress Notes (Signed)
 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  SUMMARY OF ONCOLOGIC HISTORY: Oncology History  CLL (chronic lymphocytic leukemia) (HCC)  07/24/2012 Initial Diagnosis   CLL (chronic lymphocytic leukemia) (HCC)   08/27/2018 - 10/02/2018 Chemotherapy   The patient had riTUXimab (RITUXAN) 100 mg in sodium chloride 0.9 % 250 mL (0.3846 mg/mL) infusion, 100 mg (100 % of original dose 100 mg), Intravenous,  Once, 4 of 4 cycles Dose modification: 100 mg (original dose 100 mg, Cycle 1, Reason: Provider Judgment), 375 mg/m2 (original dose 100 mg, Cycle 2, Reason: Provider Judgment) Administration: 100 mg (08/27/2018), 800 mg (08/28/2018), 800 mg (09/04/2018), 800 mg (09/11/2018) riTUXimab (RITUXAN) 800 mg in sodium chloride 0.9 % 170 mL infusion, 375 mg/m2 = 800 mg (100 % of original dose 375 mg/m2), Intravenous,  Once, 3 of 5 cycles Dose modification: 375 mg/m2 (original dose 375 mg/m2, Cycle 5) Administration: 800 mg (09/18/2018), 800 mg (09/25/2018), 800 mg (10/02/2018)  for chemotherapy treatment.    10/22/2018 - 10/23/2018 Chemotherapy   The patient had dexamethasone (DECADRON) 4 MG tablet, 8 mg, Oral, Daily, 1 of 1 cycle, Start date: 11/03/2018, End date: 11/03/2018 palonosetron (ALOXI) injection 0.25 mg, 0.25 mg, Intravenous,  Once, 1 of 4 cycles Administration: 0.25 mg (10/22/2018) riTUXimab (RITUXAN) 1,000 mg in sodium chloride 0.9 % 250 mL (2.8571 mg/mL) infusion, 500 mg/m2 = 1,000 mg (100 % of original dose 500 mg/m2), Intravenous,  Once, 1 of 1 cycle Dose modification: 500 mg/m2 (original dose 500 mg/m2, Cycle 1, Reason: Provider  Judgment) bendamustine (BENDEKA) 125 mg in sodium chloride 0.9 % 50 mL (2.2727 mg/mL) chemo infusion, 70 mg/m2 = 125 mg (100 % of original dose 70 mg/m2), Intravenous,  Once, 1 of 4 cycles Dose modification: 70 mg/m2 (original dose 70 mg/m2, Cycle 1, Reason: Provider Judgment) Administration: 125 mg (10/22/2018), 125 mg (10/23/2018) riTUXimab (RITUXAN) 1,000 mg in sodium chloride 0.9 % 150 mL infusion, 500 mg/m2 = 1,000 mg (100 % of original dose 500 mg/m2), Intravenous,  Once, 1 of 1 cycle Dose modification: 500 mg/m2 (original dose 500 mg/m2, Cycle 1) Administration: 1,000 mg (10/22/2018) ofatumumab (ARZERRA) 300 mg in sodium chloride 0.9 % 985 mL chemo infusion, 300 mg (100 % of original dose 300 mg), Intravenous,  Once, 0 of 3 cycles Dose modification: 300 mg (original dose 300 mg, Cycle 2), 1,000 mg (original dose 1,000 mg, Cycle 2)  for chemotherapy treatment.    11/11/2018 - 09/09/2019 Chemotherapy   The patient had obinutuzumab (GAZYVA) 100 mg in sodium chloride 0.9 % 100 mL (0.9615 mg/mL) chemo infusion, 100 mg (100 % of original dose 100 mg), Intravenous, Once, 15 of 16 cycles Dose modification: 100 mg (original dose 100 mg, Cycle 1, Reason: Provider Judgment), 900 mg (original dose 900 mg, Cycle 2, Reason: Provider Judgment) Administration: 100 mg (11/11/2018), 900 mg (11/12/2018), 1,000 mg (11/19/2018), 1,000 mg (12/26/2018), 1,000 mg (02/03/2019), 1,000 mg (03/17/2019), 1,000 mg (04/07/2019), 1,000 mg (04/28/2019), 1,000 mg (02/25/2019), 1,000 mg (05/19/2019), 1,000 mg (06/12/2019), 1,000 mg (07/03/2019), 1,000 mg (07/24/2019), 1,000 mg (08/13/2019), 1,000 mg (09/09/2019)  for chemotherapy treatment.    11/19/2018 - 05/19/2019 Chemotherapy   The patient had DOXOrubicin (ADRIAMYCIN) chemo injection 100 mg, 50 mg/m2 = 100  mg, Intravenous,  Once, 8 of 8 cycles Dose modification: 25 mg/m2 (original dose 50 mg/m2, Cycle 2, Reason: Provider Judgment), 75 mg (original dose 50 mg/m2, Cycle 3, Reason: Provider  Judgment), 40 mg/m2 (original dose 50 mg/m2, Cycle 3, Reason: Provider Judgment), 30 mg/m2 (original dose 50 mg/m2, Cycle 4, Reason: Provider Judgment, Comment: Per MD instruction), 35 mg/m2 (original dose 50 mg/m2, Cycle 4, Reason: Provider Judgment), 35 mg/m2 (original dose 50 mg/m2, Cycle 5, Reason: Provider Judgment), 35 mg/m2 (original dose 50 mg/m2, Cycle 6, Reason: Provider Judgment), 35 mg/m2 (original dose 50 mg/m2, Cycle 7, Reason: Provider Judgment) Administration: 100 mg (11/19/2018), 50 mg (12/26/2018), 60 mg (02/03/2019), 70 mg (02/24/2019), 70 mg (03/17/2019), 70 mg (04/07/2019), 70 mg (04/28/2019), 70 mg (05/19/2019) palonosetron (ALOXI) injection 0.25 mg, 0.25 mg, Intravenous,  Once, 8 of 8 cycles Administration: 0.25 mg (11/19/2018), 0.25 mg (12/26/2018), 0.25 mg (02/03/2019), 0.25 mg (02/24/2019), 0.25 mg (03/17/2019), 0.25 mg (04/07/2019), 0.25 mg (04/28/2019), 0.25 mg (05/19/2019) pegfilgrastim (NEULASTA) injection 6 mg, 6 mg, Subcutaneous, Once, 1 of 1 cycle Administration: 6 mg (11/21/2018) pegfilgrastim (NEULASTA ONPRO KIT) injection 6 mg, 6 mg, Subcutaneous, Once, 7 of 7 cycles Administration: 6 mg (12/26/2018), 6 mg (02/03/2019), 6 mg (02/24/2019), 6 mg (03/17/2019), 6 mg (04/07/2019), 6 mg (04/28/2019), 6 mg (05/19/2019) cyclophosphamide (CYTOXAN) 1,500 mg in sodium chloride 0.9 % 250 mL chemo infusion, 750 mg/m2 = 1,500 mg, Intravenous,  Once, 8 of 8 cycles Dose modification: 400 mg/m2 (original dose 750 mg/m2, Cycle 2, Reason: Provider Judgment), 500 mg/m2 (original dose 750 mg/m2, Cycle 3, Reason: Provider Judgment), 500 mg/m2 (original dose 750 mg/m2, Cycle 4, Reason: Provider Judgment, Comment: Per MD instruction), 550 mg/m2 (original dose 750 mg/m2, Cycle 4, Reason: Provider Judgment), 550 mg/m2 (original dose 750 mg/m2, Cycle 5, Reason: Provider Judgment), 550 mg/m2 (original dose 750 mg/m2, Cycle 6, Reason: Provider Judgment), 550 mg/m2 (original dose 750 mg/m2, Cycle 7, Reason: Provider  Judgment) Administration: 1,500 mg (11/19/2018), 800 mg (12/26/2018), 1,000 mg (02/03/2019), 1,100 mg (02/24/2019), 1,100 mg (03/17/2019), 1,100 mg (04/07/2019), 1,100 mg (04/28/2019), 1,100 mg (05/19/2019) fosaprepitant (EMEND) 150 mg, dexamethasone (DECADRON) 12 mg in sodium chloride 0.9 % 145 mL IVPB, , Intravenous,  Once, 8 of 8 cycles Administration:  (11/19/2018),  (12/26/2018),  (02/03/2019),  (02/24/2019),  (03/17/2019),  (04/07/2019),  (04/28/2019),  (05/19/2019)  for chemotherapy treatment.     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             (b) rituximab added to ibrutinib,  first dose 08/27/2018             (c) hepatitis B studies 08/27/2018 negative             (f) ibrutinib/rituximab discontinued after 10/02/2018 dose w/o obvious response   (6) bendamustine/rituximab started 10/22/2018, discontinued after 1 cycle with rapid progression   (7) obinutuzumab/Gaziva started 11/11/2018             (a) second dose given 11/19/2018 together with chemotherapy             (b)  third dose given 12/26/2018 and continuing every 3 weeks             (c) obinutuzumab discontinued after 09/09/2019 dose, with documentation of complete remission   (8) CH[O]P chemotherapy started 11/19/2018, completed 8 doses 05/19/2019             (a) echocardiogram on 11/07/2018 shows EF of 55-60%             (b) second cycle of CH[O]P postponed because of pandemic, given 12/26/2018, greatly reduced doses             (c) cycle 3 of CH[O]P/obinutuzumab given 02/03/2019 at increased doses             (d) cycle 4 and 5 of CH[O]P given on 6/16 and 7/7 at increased doses of 35mg /m2 and 550mg /m2             (e) Repeat echo on 04/22/2019 shows an EF of 60-65%   (9) severe immunocompromise: Marked hypogammaglobulinemia             (a) starting March 2020 she received IVIG every 8 weeks as needed             (b) starting September 2021 she receives IVIG every 3 months             (c) received Evusheld 11/21/2020   (10) 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 female Is here for continued evaluation and management of CLL/SLL.  Patient was last seen by me on 07/23/2023 and reported mild memory issues and fluid retention. She also reported weight-loss with Mounjaro and noted diarrhea when initially starting the medication.   Today, she reports that she has been doing fairly well since her last clinical visit. She reports that she has tried stopping 10 MG Mounjaro for a couple weeks in  March due to experiencing depressive symptoms. She denies endorsing any suicidal ideations. Patient has noticed improvement in mood since stopping Mounjaro. She reports that she is considering restarting the medication.   She reports having some weight-gain of about 10 pounds which she reports could be attributed to temporarily stopping Mounjaro. Her weight in clinic today is 200 pounds. She notes that Mounjaro did curb her appetite.   She reports that she recently became physically active and engages in exercise classes.  She reports that she will see her PCP, Dr. Clelia Croft, in the next few weeks.   She reports that she has been tolerating Venetoclax well with no new or severe toxicity issues. Patient denies any concern for skin rashes, itching, diarrhea, or other infection issues.   Patient reports endorsing regular bruising. Patient notes that her skin bruises may be partly from scratches from one of her cats. She is not taking aspirin. Patient takes Acetaminophen for pain if needed.   She denies any concern for enlarged lymph nodes, back pain, or abdominal pain.   She reports that she recently restarted taking Lasix again due to leg swelling managed by Dr. Clelia Croft. Patient has mild leg swelling at this time. She notes that she did not take Lasix today due to traveling.  She reports that she may travel to [place] soon to visit family.   ALLERGIES:  is allergic to iohexol, compazine [prochlorperazine edisylate], contrast media  [iodinated contrast media], lactose intolerance (gi), and cephalexin.  MEDICATIONS:  Current Outpatient Medications  Medication Sig Dispense Refill   acetaminophen (TYLENOL 8 HOUR ARTHRITIS PAIN) 650 MG CR tablet Take 650 mg by mouth every 8 (eight) hours as needed for pain.     cholecalciferol (VITAMIN D3) 25 MCG (1000 UNIT) tablet Take 5,000 Units by mouth daily.     furosemide (LASIX) 20 MG tablet Take 1 tablet by mouth once daily 30 tablet 3   rosuvastatin (CRESTOR) 10  MG tablet Take 10 mg by mouth at bedtime.  11   rosuvastatin (CRESTOR) 10 MG tablet Take 1 tablet (10 mg total) by mouth at bedtime. 90 tablet 3   SYNTHROID 88 MCG tablet Take 88 mcg by mouth daily before breakfast.      SYNTHROID 88 MCG tablet Take 1 tablet (88 mcg total) by mouth daily. 90 tablet 3   tirzepatide (MOUNJARO) 10 MG/0.5ML Pen Inject 10 mg into the skin once a week. 2 mL 3   tirzepatide (MOUNJARO) 5 MG/0.5ML Pen Inject 5mg  under the skin once weekly. 2 mL 3   tirzepatide (MOUNJARO) 7.5 MG/0.5ML Pen Inject 7.5 mg into the skin once a week. 2 mL 4   venetoclax (VENCLEXTA) 100 MG tablet TAKE 2 TABLETS BY MOUTH DAILY. TAKE WITH FOOD AND WATER AT APPROXIMATELY THE SAME TIME EACH DAY. 56 tablet 2   No current facility-administered medications for this visit.   REVIEW OF SYSTEMS:  10 Point review of Systems was done is negative except as noted above.   PHYSICAL EXAMINATION: ECOG PERFORMANCE STATUS: 1 - Symptomatic but completely ambulatory  Vitals:   11/29/23 1310  BP: (!) 131/54  Pulse: 62  Resp: 16  Temp: 97.7 F (36.5 C)  SpO2: 100%     Filed Weights   11/29/23 1310  Weight: 200 lb 1.6 oz (90.8 kg)     GENERAL:alert, in no acute distress and comfortable SKIN: no acute rashes, no significant lesions EYES: conjunctiva are pink and non-injected, sclera anicteric OROPHARYNX: MMM, no exudates, no oropharyngeal erythema or ulceration NECK: supple, no JVD LYMPH:  no palpable lymphadenopathy in the cervical, axillary or inguinal regions LUNGS: clear to auscultation b/l with normal respiratory effort HEART: regular rate & rhythm ABDOMEN:  normoactive bowel sounds , non tender, not distended. Extremity: no pedal edema PSYCH: alert & oriented x 3 with fluent speech NEURO: no focal motor/sensory deficits   LABORATORY DATA:  I have reviewed the data as listed     Latest Ref Rng & Units 07/23/2023   12:08  PM 04/23/2023   11:18 AM 01/22/2023    9:36 AM  CMP  Glucose  70 - 99 mg/dL 161  096  045   BUN 8 - 23 mg/dL 18  15  16    Creatinine 0.44 - 1.00 mg/dL 4.09  8.11  9.14   Sodium 135 - 145 mmol/L 140  142  142   Potassium 3.5 - 5.1 mmol/L 3.7  3.8  4.0   Chloride 98 - 111 mmol/L 105  108  110   CO2 22 - 32 mmol/L 30  29  27    Calcium 8.9 - 10.3 mg/dL 78.2  9.4  9.6   Total Protein 6.5 - 8.1 g/dL 6.2  5.9  6.0   Total Bilirubin <1.2 mg/dL 0.7  0.8  0.7   Alkaline Phos 38 - 126 U/L 69  69  62   AST 15 - 41 U/L 17  15  16    ALT 0 - 44 U/L 16  11  13    .    Latest Ref Rng & Units 07/23/2023   12:08 PM 04/23/2023   11:18 AM 01/22/2023    9:36 AM  CBC  WBC 4.0 - 10.5 K/uL 6.1  5.3  4.3   Hemoglobin 12.0 - 15.0 g/dL 95.6  21.3  08.6   Hematocrit 36.0 - 46.0 % 44.9  43.1  41.8   Platelets 150 - 400 K/uL 182  167  156    . Lab Results  Component Value Date   IRON 63 07/23/2023   TIBC 431 07/23/2023   IRONPCTSAT 15 07/23/2023   (Iron and TIBC)  Lab Results  Component Value Date   FERRITIN 15 07/23/2023    Lab Results  Component Value Date   LDH 150 07/23/2023   ASSESSMENT & PLAN:   CLL (chronic lymphocytic leukemia) (HCC) history of chronic lymphoid leukemia initially diagnosed in January 2003,    (1) treated in 2005 with cyclophosphamide, vincristine, prednisone and Rituxan   (2) treated next in 2008 with cyclophosphamide, fludarabine and rituximab, last dose November of 2008   (3) status post right axillary lymph node biopsy 07/18/2012 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             (b) rituximab added to ibrutinib, first dose 08/27/2018             (c) hepatitis B studies 08/27/2018 negative             (f) ibrutinib/rituximab discontinued after 10/02/2018 dose w/o obvious response   (6) bendamustine/rituximab started 10/22/2018, discontinued after 1 cycle with rapid progression   (7) obinutuzumab/Gaziva started 11/11/2018             (a) second dose given 11/19/2018 together with chemotherapy             (b) third dose  given 12/26/2018 and continuing every 3 weeks             (c) obinutuzumab discontinued after 09/09/2019 dose, with documentation of complete remission   (8) CH[O]P chemotherapy started 11/19/2018, completed 8 doses 05/19/2019             (a) echocardiogram on 11/07/2018 shows EF of 55-60%             (b) second cycle of CH[O]P postponed because of pandemic, given 12/26/2018, greatly reduced doses             (c) cycle 3 of CH[O]P/obinutuzumab given 02/03/2019 at increased doses             (d) cycle 4 and 5 of CH[O]P given on 6/16 and 7/7 at increased doses of 35mg /m2 and 550mg /m2             (e) Repeat echo on 04/22/2019 shows an EF of 60-65%   (9) severe immunocompromise: Marked hypogammaglobulinemia             (a) starting March 2020 she received IVIG every 8 weeks as needed             (b) starting September 2021 she receives IVIG every 3 months             (c) received Evusheld 11/21/2020   (10) 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  PLAN:  -Discussed lab  results on 11/29/23 in detail with patient. CBC normal, showed WBC of 5.3K, hemoglobin of 13.8, and platelets of 152K. -CMP normal -Patient has no new signs or symptoms suggestive of CLL progression at this time -No notable toxicities from the patient's current dose of venetoclax at this time -Continue venetoclax 200 mg p.o. daily. -advised patient to connect with her PCP for consideration of decreasing Mounjaro by 1 dose level to prevent depressive symptoms -discussed that it is difficult to determine how much side effects of Mounjaro are dose dependent and how much is cumulative -answered all of patient's questions in detail  FOLLOW UP: RTC with Dr Candise Che with portflush, labs and MD visit in 4 months     The total time spent in the appointment was 20 minutes* .  All of the patient's questions were answered with apparent satisfaction. The patient knows to call the clinic with any problems, questions or concerns.   Wyvonnia Lora MD MS AAHIVMS [facility] Hematology/Oncology Physician [facility]  .*Total Encounter Time as defined by the Centers for Medicare and Medicaid Services includes, in addition to the face-to-face time of a patient visit (documented in the note above) non-face-to-face time: obtaining and reviewing outside history, ordering and reviewing medications, tests or procedures, care coordination (communications with other health care professionals or caregivers) and documentation in the medical record.    I,Mitra Faeizi,acting as a Neurosurgeon for Wyvonnia Lora, MD.,have documented all relevant documentation on the behalf of Wyvonnia Lora, MD,as directed by  Wyvonnia Lora, MD while in the presence of Wyvonnia Lora, MD.  .  I have reviewed the above documentation for accuracy and completeness, and I agree with the above. Johney Maine MD

## 2023-12-05 ENCOUNTER — Encounter: Payer: Self-pay | Admitting: Oncology

## 2023-12-10 ENCOUNTER — Other Ambulatory Visit: Payer: Self-pay

## 2023-12-10 ENCOUNTER — Other Ambulatory Visit (HOSPITAL_COMMUNITY): Payer: Self-pay

## 2023-12-10 ENCOUNTER — Other Ambulatory Visit: Payer: Self-pay | Admitting: Pharmacy Technician

## 2023-12-10 NOTE — Progress Notes (Signed)
 Specialty Pharmacy Ongoing Clinical Assessment Note  Maria [name] is a 73 y.o. female who is being followed by the specialty pharmacy service for RxSp Oncology   Patient's specialty medication(s) reviewed today: Venetoclax (VENCLEXTA)   Missed doses in the last 4 weeks: 0   Patient/Caregiver did not have any additional questions or concerns.   Therapeutic benefit summary: Patient is achieving benefit   Adverse events/side effects summary: No adverse events/side effects   Patient's therapy is appropriate to: Continue    Goals Addressed             This Visit's Progress    Achieve or maintain remission   On track    Patient is on track. Patient will maintain adherence.  Dr. Candise Che reported no new signs or symptoms of CLL progression at OV on 11/29/23.        Follow up:  6 months  Servando Snare Specialty Pharmacist

## 2023-12-10 NOTE — Progress Notes (Signed)
 Specialty Pharmacy Refill Coordination Note  Maria [name] is a 73 y.o. female contacted today regarding refills of specialty medication(s) Venetoclax [name])   Patient requested Delivery   Delivery date: 12/19/23   Verified address: [address]  [place]   Medication will be filled on 12/18/23.

## 2024-01-08 ENCOUNTER — Encounter (HOSPITAL_COMMUNITY): Payer: Self-pay

## 2024-01-08 ENCOUNTER — Other Ambulatory Visit (HOSPITAL_COMMUNITY): Payer: Self-pay

## 2024-01-09 ENCOUNTER — Other Ambulatory Visit: Payer: Self-pay

## 2024-01-09 NOTE — Progress Notes (Signed)
 Specialty Pharmacy Refill Coordination Note  Maria [name] is a 73 y.o. female contacted today regarding refills of specialty medication(s) Venetoclax  (VENCLEXTA )   Patient requested (Patient-Rptd) Delivery   Delivery date: (Patient-Rptd) 01/20/24   Verified address: (Patient-Rptd) [address] [place], [place] [address]   Medication will be filled on 01/17/24.

## 2024-01-15 ENCOUNTER — Other Ambulatory Visit (HOSPITAL_COMMUNITY): Payer: Self-pay

## 2024-01-15 DIAGNOSIS — E1149 Type 2 diabetes mellitus with other diabetic neurological complication: Secondary | ICD-10-CM | POA: Diagnosis not present

## 2024-01-15 DIAGNOSIS — M810 Age-related osteoporosis without current pathological fracture: Secondary | ICD-10-CM | POA: Diagnosis not present

## 2024-01-15 DIAGNOSIS — I129 Hypertensive chronic kidney disease with stage 1 through stage 4 chronic kidney disease, or unspecified chronic kidney disease: Secondary | ICD-10-CM | POA: Diagnosis not present

## 2024-01-15 DIAGNOSIS — E669 Obesity, unspecified: Secondary | ICD-10-CM | POA: Diagnosis not present

## 2024-01-15 DIAGNOSIS — D849 Immunodeficiency, unspecified: Secondary | ICD-10-CM | POA: Diagnosis not present

## 2024-01-15 DIAGNOSIS — N1831 Chronic kidney disease, stage 3a: Secondary | ICD-10-CM | POA: Diagnosis not present

## 2024-01-15 DIAGNOSIS — E039 Hypothyroidism, unspecified: Secondary | ICD-10-CM | POA: Diagnosis not present

## 2024-01-15 DIAGNOSIS — C9110 Chronic lymphocytic leukemia of B-cell type not having achieved remission: Secondary | ICD-10-CM | POA: Diagnosis not present

## 2024-01-15 MED ORDER — OZEMPIC (1 MG/DOSE) 4 MG/3ML ~~LOC~~ SOPN
1.0000 mg | PEN_INJECTOR | SUBCUTANEOUS | 3 refills | Status: DC
  Filled 2024-01-15: qty 3, 28d supply, fill #0
  Filled 2024-02-29: qty 3, 28d supply, fill #1
  Filled 2024-04-13: qty 3, 28d supply, fill #2
  Filled 2024-05-06: qty 3, 28d supply, fill #3

## 2024-01-17 ENCOUNTER — Other Ambulatory Visit: Payer: Self-pay

## 2024-01-18 ENCOUNTER — Other Ambulatory Visit (HOSPITAL_COMMUNITY): Payer: Self-pay

## 2024-02-05 ENCOUNTER — Other Ambulatory Visit (HOSPITAL_COMMUNITY): Payer: Self-pay

## 2024-02-13 ENCOUNTER — Other Ambulatory Visit: Payer: Self-pay | Admitting: Hematology

## 2024-02-13 ENCOUNTER — Other Ambulatory Visit: Payer: Self-pay

## 2024-02-13 ENCOUNTER — Other Ambulatory Visit (HOSPITAL_COMMUNITY): Payer: Self-pay

## 2024-02-13 DIAGNOSIS — C9110 Chronic lymphocytic leukemia of B-cell type not having achieved remission: Secondary | ICD-10-CM

## 2024-02-13 MED ORDER — VENETOCLAX 100 MG PO TABS
ORAL_TABLET | ORAL | 2 refills | Status: DC
  Filled 2024-02-13: qty 56, 28d supply, fill #0
  Filled 2024-03-10: qty 56, 28d supply, fill #1
  Filled 2024-04-02: qty 56, 28d supply, fill #2

## 2024-02-13 NOTE — Progress Notes (Signed)
 Specialty Pharmacy Refill Coordination Note  Maria [name] is a 73 y.o. female contacted today regarding refills of specialty medication(s) Venetoclax  (VENCLEXTA )   Patient requested (Patient-Rptd) Delivery   Delivery date: (Patient-Rptd) 02/17/24   Verified address: (Patient-Rptd) [address]  [place], [place]  [address]   Medication will be filled on 02/14/24, pending refill approval.

## 2024-02-14 ENCOUNTER — Other Ambulatory Visit: Payer: Self-pay

## 2024-02-29 ENCOUNTER — Other Ambulatory Visit (HOSPITAL_COMMUNITY): Payer: Self-pay

## 2024-03-02 ENCOUNTER — Other Ambulatory Visit: Payer: Self-pay

## 2024-03-09 ENCOUNTER — Other Ambulatory Visit (HOSPITAL_COMMUNITY): Payer: Self-pay

## 2024-03-10 ENCOUNTER — Encounter (INDEPENDENT_AMBULATORY_CARE_PROVIDER_SITE_OTHER): Payer: Self-pay

## 2024-03-10 ENCOUNTER — Other Ambulatory Visit: Payer: Self-pay

## 2024-03-10 NOTE — Progress Notes (Signed)
 Specialty Pharmacy Refill Coordination Note  Maria [name] is a 73 y.o. female contacted today regarding refills of specialty medication(s) Venetoclax  (VENCLEXTA )   Patient requested (Patient-Rptd) Delivery   Delivery date: 03/11/24   Verified address: (Patient-Rptd) [address] [place], [place] [address]   Medication will be filled on 03/10/24.

## 2024-03-27 ENCOUNTER — Other Ambulatory Visit: Payer: Self-pay

## 2024-03-27 DIAGNOSIS — C9110 Chronic lymphocytic leukemia of B-cell type not having achieved remission: Secondary | ICD-10-CM

## 2024-03-29 NOTE — Progress Notes (Incomplete)
 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  SUMMARY OF ONCOLOGIC HISTORY: Oncology History  CLL (chronic lymphocytic leukemia) (HCC)  07/24/2012 Initial Diagnosis   CLL (chronic lymphocytic leukemia) (HCC)   08/27/2018 - 10/02/2018 Chemotherapy   The patient had riTUXimab  (RITUXAN ) 100 mg in sodium chloride  0.9 % 250 mL (0.3846 mg/mL) infusion, 100 mg (100 % of original dose 100 mg), Intravenous,  Once, 4 of 4 cycles Dose modification: 100 mg (original dose 100 mg, Cycle 1, Reason: Provider Judgment), 375 mg/m2 (original dose 100 mg, Cycle 2, Reason: Provider Judgment) Administration: 100 mg (08/27/2018), 800 mg (08/28/2018), 800 mg (09/04/2018), 800 mg (09/11/2018) riTUXimab  (RITUXAN ) 800 mg in sodium chloride  0.9 % 170 mL infusion, 375 mg/m2 = 800 mg (100 % of original dose 375 mg/m2), Intravenous,  Once, 3 of 5 cycles Dose modification: 375 mg/m2 (original dose 375 mg/m2, Cycle 5) Administration: 800 mg (09/18/2018), 800 mg (09/25/2018), 800 mg (10/02/2018)  for chemotherapy treatment.    10/22/2018 - 10/23/2018 Chemotherapy   The patient had dexamethasone  (DECADRON ) 4 MG tablet, 8 mg, Oral, Daily, 1 of 1 cycle, Start date: 11/03/2018, End date: 11/03/2018 palonosetron  (ALOXI ) injection 0.25 mg, 0.25 mg, Intravenous,  Once, 1 of 4 cycles Administration: 0.25 mg (10/22/2018) riTUXimab  (RITUXAN ) 1,000 mg in sodium chloride  0.9 % 250 mL (2.8571 mg/mL) infusion, 500 mg/m2 = 1,000 mg (100 % of original dose 500 mg/m2), Intravenous,  Once, 1 of 1 cycle Dose modification: 500 mg/m2 (original dose 500 mg/m2, Cycle 1, Reason: Provider  Judgment) bendamustine  (BENDEKA ) 125 mg in sodium chloride  0.9 % 50 mL (2.2727 mg/mL) chemo infusion, 70 mg/m2 = 125 mg (100 % of original dose 70 mg/m2), Intravenous,  Once, 1 of 4 cycles Dose modification: 70 mg/m2 (original dose 70 mg/m2, Cycle 1, Reason: Provider Judgment) Administration: 125 mg (10/22/2018), 125 mg (10/23/2018) riTUXimab  (RITUXAN ) 1,000 mg in sodium chloride  0.9 % 150 mL infusion, 500 mg/m2 = 1,000 mg (100 % of original dose 500 mg/m2), Intravenous,  Once, 1 of 1 cycle Dose modification: 500 mg/m2 (original dose 500 mg/m2, Cycle 1) Administration: 1,000 mg (10/22/2018) ofatumumab (ARZERRA) 300 mg in sodium chloride  0.9 % 985 mL chemo infusion, 300 mg (100 % of original dose 300 mg), Intravenous,  Once, 0 of 3 cycles Dose modification: 300 mg (original dose 300 mg, Cycle 2), 1,000 mg (original dose 1,000 mg, Cycle 2)  for chemotherapy treatment.    11/11/2018 - 09/09/2019 Chemotherapy   The patient had obinutuzumab  (GAZYVA ) 100 mg in sodium chloride  0.9 % 100 mL (0.9615 mg/mL) chemo infusion, 100 mg (100 % of original dose 100 mg), Intravenous, Once, 15 of 16 cycles Dose modification: 100 mg (original dose 100 mg, Cycle 1, Reason: Provider Judgment), 900 mg (original dose 900 mg, Cycle 2, Reason: Provider Judgment) Administration: 100 mg (11/11/2018), 900 mg (11/12/2018), 1,000 mg (11/19/2018), 1,000 mg (12/26/2018), 1,000 mg (02/03/2019), 1,000 mg (03/17/2019), 1,000 mg (04/07/2019), 1,000 mg (04/28/2019), 1,000 mg (02/25/2019), 1,000 mg (05/19/2019), 1,000 mg (06/12/2019), 1,000 mg (07/03/2019), 1,000 mg (07/24/2019), 1,000 mg (08/13/2019), 1,000 mg (09/09/2019)  for chemotherapy treatment.    11/19/2018 - 05/19/2019 Chemotherapy   The patient had DOXOrubicin  (ADRIAMYCIN ) chemo injection 100 mg, 50 mg/m2 = 100  mg, Intravenous,  Once, 8 of 8 cycles Dose modification: 25 mg/m2 (original dose 50 mg/m2, Cycle 2, Reason: Provider Judgment), 75 mg (original dose 50 mg/m2, Cycle 3, Reason: Provider  Judgment), 40 mg/m2 (original dose 50 mg/m2, Cycle 3, Reason: Provider Judgment), 30 mg/m2 (original dose 50 mg/m2, Cycle 4, Reason: Provider Judgment, Comment: Per MD instruction), 35 mg/m2 (original dose 50 mg/m2, Cycle 4, Reason: Provider Judgment), 35 mg/m2 (original dose 50 mg/m2, Cycle 5, Reason: Provider Judgment), 35 mg/m2 (original dose 50 mg/m2, Cycle 6, Reason: Provider Judgment), 35 mg/m2 (original dose 50 mg/m2, Cycle 7, Reason: Provider Judgment) Administration: 100 mg (11/19/2018), 50 mg (12/26/2018), 60 mg (02/03/2019), 70 mg (02/24/2019), 70 mg (03/17/2019), 70 mg (04/07/2019), 70 mg (04/28/2019), 70 mg (05/19/2019) palonosetron  (ALOXI ) injection 0.25 mg, 0.25 mg, Intravenous,  Once, 8 of 8 cycles Administration: 0.25 mg (11/19/2018), 0.25 mg (12/26/2018), 0.25 mg (02/03/2019), 0.25 mg (02/24/2019), 0.25 mg (03/17/2019), 0.25 mg (04/07/2019), 0.25 mg (04/28/2019), 0.25 mg (05/19/2019) pegfilgrastim  (NEULASTA ) injection 6 mg, 6 mg, Subcutaneous, Once, 1 of 1 cycle Administration: 6 mg (11/21/2018) pegfilgrastim  (NEULASTA  ONPRO KIT) injection 6 mg, 6 mg, Subcutaneous, Once, 7 of 7 cycles Administration: 6 mg (12/26/2018), 6 mg (02/03/2019), 6 mg (02/24/2019), 6 mg (03/17/2019), 6 mg (04/07/2019), 6 mg (04/28/2019), 6 mg (05/19/2019) cyclophosphamide  (CYTOXAN ) 1,500 mg in sodium chloride  0.9 % 250 mL chemo infusion, 750 mg/m2 = 1,500 mg, Intravenous,  Once, 8 of 8 cycles Dose modification: 400 mg/m2 (original dose 750 mg/m2, Cycle 2, Reason: Provider Judgment), 500 mg/m2 (original dose 750 mg/m2, Cycle 3, Reason: Provider Judgment), 500 mg/m2 (original dose 750 mg/m2, Cycle 4, Reason: Provider Judgment, Comment: Per MD instruction), 550 mg/m2 (original dose 750 mg/m2, Cycle 4, Reason: Provider Judgment), 550 mg/m2 (original dose 750 mg/m2, Cycle 5, Reason: Provider Judgment), 550 mg/m2 (original dose 750 mg/m2, Cycle 6, Reason: Provider Judgment), 550 mg/m2 (original dose 750 mg/m2, Cycle 7, Reason: Provider  Judgment) Administration: 1,500 mg (11/19/2018), 800 mg (12/26/2018), 1,000 mg (02/03/2019), 1,100 mg (02/24/2019), 1,100 mg (03/17/2019), 1,100 mg (04/07/2019), 1,100 mg (04/28/2019), 1,100 mg (05/19/2019) fosaprepitant  (EMEND ) 150 mg, dexamethasone  (DECADRON ) 12 mg in sodium chloride  0.9 % 145 mL IVPB, , Intravenous,  Once, 8 of 8 cycles Administration:  (11/19/2018),  (12/26/2018),  (02/03/2019),  (02/24/2019),  (03/17/2019),  (04/07/2019),  (04/28/2019),  (05/19/2019)  for chemotherapy treatment.     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             (b) rituximab  added to ibrutinib ,  first dose 08/27/2018             (c) hepatitis B studies 08/27/2018 negative             (f) ibrutinib /rituximab  discontinued after 10/02/2018 dose w/o obvious response   (6) bendamustine /rituximab  started 10/22/2018, discontinued after 1 cycle with rapid progression   (7) obinutuzumab /Gaziva started 11/11/2018             (a) second dose given 11/19/2018 together with chemotherapy             (b)  third dose given 12/26/2018 and continuing every 3 weeks             (c) obinutuzumab  discontinued after 09/09/2019 dose, with documentation of complete remission   (8) CH[O]P chemotherapy started 11/19/2018, completed 8 doses 05/19/2019             (a) echocardiogram on 11/07/2018 shows EF of 55-60%             (b) second cycle of CH[O]P postponed because of pandemic, given 12/26/2018, greatly reduced doses             (c) cycle 3 of CH[O]P/obinutuzumab  given 02/03/2019 at increased doses             (d) cycle 4 and 5 of CH[O]P given on 6/16 and 7/7 at increased doses of 35mg /m2 and 550mg /m2             (e) Repeat echo on 04/22/2019 shows an EF of 60-65%   (9) severe immunocompromise: Marked hypogammaglobulinemia             (a) starting March 2020 she received IVIG every 8 weeks as needed             (b) starting September 2021 she receives IVIG every 3 months             (c) received Evusheld 11/21/2020   (10) 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           rivaroxaban  stopped December 2021   (11) started venetoclax  ramp-up 06/11/2019, progressed to 400 mg/d dose w/o event             (a) dose reduced to 200 mg daily 08/05/2019 secondary to cytopenias             (b) PET scan December 2021 shows small minimally hypermetabolic residual periportal lymph nodes but no additional adenopathy elsewhere.  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 female here for continued evaluation and management of CLL/SLL.  Patient was last seen by me on 11/29/2023 and reported prior depressive symptoms attributed to Mounjaro  which she stopped, 10-pound weight gain, regular bruising, and mild leg swelling.    ALLERGIES:  is allergic to iohexol, compazine  [prochlorperazine  edisylate], contrast media  [iodinated contrast media], lactose intolerance (gi), and  cephalexin.  MEDICATIONS:  Current Outpatient Medications  Medication Sig Dispense Refill   acetaminophen  (TYLENOL  8 HOUR ARTHRITIS PAIN) 650 MG CR tablet Take 650 mg by mouth every 8 (eight) hours as needed for pain.     cholecalciferol (VITAMIN D3) 25 MCG (1000 UNIT) tablet Take 5,000 Units by mouth daily.     furosemide  (LASIX ) 20 MG tablet Take 1 tablet by mouth once daily 30 tablet 3   rosuvastatin  (CRESTOR ) 10 MG tablet Take 10 mg by mouth at bedtime.  11   rosuvastatin  (CRESTOR ) 10 MG  tablet Take 1 tablet (10 mg total) by mouth at bedtime. 90 tablet 3   Semaglutide , 1 MG/DOSE, (OZEMPIC , 1 MG/DOSE,) 4 MG/3ML SOPN Inject 1 mg into the skin once a week. 3 mL 3   SYNTHROID  88 MCG tablet Take 88 mcg by mouth daily before breakfast.      SYNTHROID  88 MCG tablet Take 1 tablet (88 mcg total) by mouth daily. 90 tablet 3   tirzepatide  (MOUNJARO ) 10 MG/0.5ML Pen Inject 10 mg into the skin once a week. 2 mL 3   tirzepatide  (MOUNJARO ) 5 MG/0.5ML Pen Inject 5mg  under the skin once weekly. 2 mL 3   tirzepatide  (MOUNJARO ) 7.5 MG/0.5ML Pen Inject 7.5 mg into the skin once a week. 2 mL 4   venetoclax  (VENCLEXTA ) 100 MG tablet TAKE 2 TABLETS BY MOUTH DAILY. TAKE WITH FOOD AND WATER AT APPROXIMATELY THE SAME TIME EACH DAY. 56 tablet 2   No current facility-administered medications for this visit.   REVIEW OF SYSTEMS:  10 Point review of Systems was done is negative except as noted above.   PHYSICAL EXAMINATION: ECOG PERFORMANCE STATUS: 1 - Symptomatic but completely ambulatory  There were no vitals filed for this visit.    There were no vitals filed for this visit.   GENERAL:alert, in no acute distress and comfortable SKIN: no acute rashes, no significant lesions EYES: conjunctiva are pink and non-injected, sclera anicteric OROPHARYNX: MMM, no exudates, no oropharyngeal erythema or ulceration NECK: supple, no JVD LYMPH:  no palpable lymphadenopathy in the cervical, axillary or inguinal  regions LUNGS: clear to auscultation b/l with normal respiratory effort HEART: regular rate & rhythm ABDOMEN:  normoactive bowel sounds , non tender, not distended. Extremity: no pedal edema PSYCH: alert & oriented x 3 with fluent speech NEURO: no focal motor/sensory deficits   LABORATORY DATA:  I have reviewed the data as listed     Latest Ref Rng & Units 11/29/2023   12:24 PM 07/23/2023   12:08 PM 04/23/2023   11:18 AM  CMP  Glucose 70 - 99 mg/dL 871  886  896   BUN 8 - 23 mg/dL 20  18  15    Creatinine 0.44 - 1.00 mg/dL 8.99  8.79  8.96   Sodium 135 - 145 mmol/L 141  140  142   Potassium 3.5 - 5.1 mmol/L 4.0  3.7  3.8   Chloride 98 - 111 mmol/L 107  105  108   CO2 22 - 32 mmol/L 28  30  29    Calcium  8.9 - 10.3 mg/dL 9.9  89.9  9.4   Total Protein 6.5 - 8.1 g/dL 6.0  6.2  5.9   Total Bilirubin 0.0 - 1.2 mg/dL 0.7  0.7  0.8   Alkaline Phos 38 - 126 U/L 68  69  69   AST 15 - 41 U/L 15  17  15    ALT 0 - 44 U/L 14  16  11    .    Latest Ref Rng & Units 11/29/2023   12:24 PM 07/23/2023   12:08 PM 04/23/2023   11:18 AM  CBC  WBC 4.0 - 10.5 K/uL 5.3  6.1  5.3   Hemoglobin 12.0 - 15.0 g/dL 86.1  85.3  85.6   Hematocrit 36.0 - 46.0 % 42.6  44.9  43.1   Platelets 150 - 400 K/uL 152  182  167    . Lab Results  Component Value Date   IRON  81 11/29/2023   TIBC 451 (H) 11/29/2023  IRONPCTSAT 18 11/29/2023   (Iron  and TIBC)  Lab Results  Component Value Date   FERRITIN 15 11/29/2023    Lab Results  Component Value Date   LDH 142 11/29/2023   ASSESSMENT & PLAN:   73 y.o. female with:  CLL (chronic lymphocytic leukemia) (HCC) history of chronic lymphoid leukemia initially diagnosed in January 2003,    (1) treated in 2005 with cyclophosphamide , vincristine, prednisone  and Rituxan    (2) treated next in 2008 with cyclophosphamide , fludarabine and rituximab , last dose November of 2008   (3) status post right axillary lymph node biopsy 07/18/2012 showing small  lymphocytic lymphoma/ chronic lymphocytic leukemia, with coexpression of CD5 and CD43. 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             (b) rituximab  added to ibrutinib , first dose 08/27/2018             (c) hepatitis B studies 08/27/2018 negative             (f) ibrutinib /rituximab  discontinued after 10/02/2018 dose w/o obvious response   (6) bendamustine /rituximab  started 10/22/2018, discontinued after 1 cycle with rapid progression   (7) obinutuzumab /Gaziva started 11/11/2018             (a) second dose given 11/19/2018 together with chemotherapy             (b) third dose given 12/26/2018 and continuing every 3 weeks             (c) obinutuzumab  discontinued after 09/09/2019 dose, with documentation of complete remission   (8) CH[O]P chemotherapy started 11/19/2018, completed 8 doses 05/19/2019             (a) echocardiogram on 11/07/2018 shows EF of 55-60%             (b) second cycle of CH[O]P postponed because of pandemic, given 12/26/2018, greatly reduced doses             (c) cycle 3 of CH[O]P/obinutuzumab  given 02/03/2019 at increased doses             (d) cycle 4 and 5 of CH[O]P given on 6/16 and 7/7 at increased doses of 35mg /m2 and 550mg /m2             (e) Repeat echo on 04/22/2019 shows an EF of 60-65%   (9) severe immunocompromise: Marked hypogammaglobulinemia             (a) starting March 2020 she received IVIG every 8 weeks as needed             (b) starting September 2021 she receives IVIG every 3 months             (c)  received Evusheld 11/21/2020   (10) 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           rivaroxaban  stopped December 2021   (11) started venetoclax  ramp-up  06/11/2019, progressed to 400 mg/d dose w/o event             (a) dose reduced to 200 mg daily 08/05/2019 secondary to cytopenias             (b) PET scan December 2021 shows small minimally hypermetabolic residual periportal lymph nodes but no additional adenopathy elsewhere.  PLAN:  -Discussed lab results from today, 03/30/2024, in detail with patient  FOLLOW UP: ***  The total time spent in the appointment was *** minutes* .  All of the patient's questions were answered with apparent satisfaction. The patient knows to call the clinic with any problems, questions or concerns.   Emaline Saran MD MS AAHIVMS [facility] Hematology/Oncology Physician [facility]  .*Total Encounter Time as defined by the Centers for Medicare and Medicaid Services includes, in addition to the face-to-face time of a patient visit (documented in the note above) non-face-to-face time: obtaining and reviewing outside history, ordering and reviewing medications, tests or procedures, care coordination (communications with other health care professionals or caregivers) and documentation in the medical record.    I,Mitra Faeizi,acting as a Neurosurgeon for Emaline Saran, MD.,have documented all relevant documentation on the behalf of Emaline Saran, MD,as directed by  Emaline Saran, MD while in the presence of Emaline Saran, MD.  ***

## 2024-03-30 ENCOUNTER — Inpatient Hospital Stay

## 2024-03-30 ENCOUNTER — Inpatient Hospital Stay: Admitting: Hematology

## 2024-04-02 ENCOUNTER — Other Ambulatory Visit: Payer: Self-pay

## 2024-04-02 NOTE — Progress Notes (Signed)
 Specialty Pharmacy Refill Coordination Note  Maria [name] is a 73 y.o. female contacted today regarding refills of specialty medication(s) Venetoclax  (VENCLEXTA )   Patient requested Delivery   Delivery date: 04/09/24   Verified address: [id number]  [place] [phone]   Medication will be filled on 04/08/24.

## 2024-04-07 ENCOUNTER — Other Ambulatory Visit: Payer: Self-pay

## 2024-04-08 ENCOUNTER — Other Ambulatory Visit: Payer: Self-pay

## 2024-04-13 ENCOUNTER — Other Ambulatory Visit (HOSPITAL_COMMUNITY): Payer: Self-pay

## 2024-04-20 ENCOUNTER — Inpatient Hospital Stay: Admitting: Hematology

## 2024-04-20 ENCOUNTER — Inpatient Hospital Stay: Attending: Hematology

## 2024-04-20 VITALS — BP 135/61 | HR 61 | Temp 97.2°F | Resp 20 | Wt 188.3 lb

## 2024-04-20 DIAGNOSIS — C9110 Chronic lymphocytic leukemia of B-cell type not having achieved remission: Secondary | ICD-10-CM | POA: Insufficient documentation

## 2024-04-20 DIAGNOSIS — Z95828 Presence of other vascular implants and grafts: Secondary | ICD-10-CM

## 2024-04-20 DIAGNOSIS — D801 Nonfamilial hypogammaglobulinemia: Secondary | ICD-10-CM

## 2024-04-20 DIAGNOSIS — D696 Thrombocytopenia, unspecified: Secondary | ICD-10-CM | POA: Diagnosis not present

## 2024-04-20 DIAGNOSIS — Z79899 Other long term (current) drug therapy: Secondary | ICD-10-CM | POA: Insufficient documentation

## 2024-04-20 LAB — CMP (CANCER CENTER ONLY)
ALT: 8 U/L (ref 0–44)
AST: 12 U/L — ABNORMAL LOW (ref 15–41)
Albumin: 4.3 g/dL (ref 3.5–5.0)
Alkaline Phosphatase: 59 U/L (ref 38–126)
Anion gap: 5 (ref 5–15)
BUN: 16 mg/dL (ref 8–23)
CO2: 27 mmol/L (ref 22–32)
Calcium: 9.5 mg/dL (ref 8.9–10.3)
Chloride: 109 mmol/L (ref 98–111)
Creatinine: 1.02 mg/dL — ABNORMAL HIGH (ref 0.44–1.00)
GFR, Estimated: 58 mL/min — ABNORMAL LOW (ref >60)
Glucose, Bld: 94 mg/dL (ref 70–99)
Potassium: 3.9 mmol/L (ref 3.5–5.1)
Sodium: 141 mmol/L (ref 135–145)
Total Bilirubin: 0.6 mg/dL (ref 0.0–1.2)
Total Protein: 5.9 g/dL — ABNORMAL LOW (ref 6.5–8.1)

## 2024-04-20 LAB — LACTATE DEHYDROGENASE: LDH: 138 U/L (ref 98–192)

## 2024-04-20 LAB — CBC WITH DIFFERENTIAL (CANCER CENTER ONLY)
Abs Immature Granulocytes: 0.01 K/uL (ref 0.00–0.07)
Basophils Absolute: 0 K/uL (ref 0.0–0.1)
Basophils Relative: 1 %
Eosinophils Absolute: 0.1 K/uL (ref 0.0–0.5)
Eosinophils Relative: 1 %
HCT: 43.7 % (ref 36.0–46.0)
Hemoglobin: 14.7 g/dL (ref 12.0–15.0)
Immature Granulocytes: 0 %
Lymphocytes Relative: 24 %
Lymphs Abs: 1.4 K/uL (ref 0.7–4.0)
MCH: 28.8 pg (ref 26.0–34.0)
MCHC: 33.6 g/dL (ref 30.0–36.0)
MCV: 85.5 fL (ref 80.0–100.0)
Monocytes Absolute: 0.6 K/uL (ref 0.1–1.0)
Monocytes Relative: 10 %
Neutro Abs: 3.6 K/uL (ref 1.7–7.7)
Neutrophils Relative %: 64 %
Platelet Count: 143 K/uL — ABNORMAL LOW (ref 150–400)
RBC: 5.11 MIL/uL (ref 3.87–5.11)
RDW: 13.6 % (ref 11.5–15.5)
WBC Count: 5.7 K/uL (ref 4.0–10.5)
nRBC: 0 % (ref 0.0–0.2)

## 2024-04-20 LAB — IRON AND IRON BINDING CAPACITY (CC-WL,HP ONLY)
Iron: 56 ug/dL (ref 28–170)
Saturation Ratios: 13 % (ref 10.4–31.8)
TIBC: 428 ug/dL (ref 250–450)
UIBC: 372 ug/dL (ref 148–442)

## 2024-04-20 LAB — FERRITIN: Ferritin: 24 ng/mL (ref 11–307)

## 2024-04-20 MED ORDER — SODIUM CHLORIDE 0.9% FLUSH
10.0000 mL | Freq: Once | INTRAVENOUS | Status: AC
  Administered 2024-04-20 (×2): 10 mL

## 2024-04-21 ENCOUNTER — Telehealth: Payer: Self-pay | Admitting: Hematology

## 2024-04-21 NOTE — Telephone Encounter (Signed)
 Maria [name] has been scheduled and contacted for her 4 month follow up.

## 2024-04-23 ENCOUNTER — Other Ambulatory Visit: Payer: Self-pay

## 2024-04-23 ENCOUNTER — Other Ambulatory Visit: Payer: Self-pay | Admitting: Hematology

## 2024-04-23 DIAGNOSIS — C9110 Chronic lymphocytic leukemia of B-cell type not having achieved remission: Secondary | ICD-10-CM

## 2024-04-23 MED ORDER — VENETOCLAX 100 MG PO TABS
ORAL_TABLET | ORAL | 2 refills | Status: DC
  Filled 2024-04-24: qty 56, fill #0
  Filled 2024-04-28: qty 56, 28d supply, fill #0
  Filled 2024-05-26: qty 56, 28d supply, fill #1
  Filled 2024-06-22 – 2024-06-26 (×2): qty 56, 28d supply, fill #2

## 2024-04-24 ENCOUNTER — Other Ambulatory Visit: Payer: Self-pay

## 2024-04-26 ENCOUNTER — Encounter: Payer: Self-pay | Admitting: Oncology

## 2024-04-26 ENCOUNTER — Other Ambulatory Visit (HOSPITAL_COMMUNITY): Payer: Self-pay

## 2024-04-26 NOTE — Progress Notes (Signed)
 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  SUMMARY OF ONCOLOGIC HISTORY: Oncology History  CLL (chronic lymphocytic leukemia) (HCC)  07/24/2012 Initial Diagnosis   CLL (chronic lymphocytic leukemia) (HCC)   08/27/2018 - 10/02/2018 Chemotherapy   The patient had riTUXimab  (RITUXAN ) 100 mg in sodium chloride  0.9 % 250 mL (0.3846 mg/mL) infusion, 100 mg (100 % of original dose 100 mg), Intravenous,  Once, 4 of 4 cycles Dose modification: 100 mg (original dose 100 mg, Cycle 1, Reason: Provider Judgment), 375 mg/m2 (original dose 100 mg, Cycle 2, Reason: Provider Judgment) Administration: 100 mg (08/27/2018), 800 mg (08/28/2018), 800 mg (09/04/2018), 800 mg (09/11/2018) riTUXimab  (RITUXAN ) 800 mg in sodium chloride  0.9 % 170 mL infusion, 375 mg/m2 = 800 mg (100 % of original dose 375 mg/m2), Intravenous,  Once, 3 of 5 cycles Dose modification: 375 mg/m2 (original dose 375 mg/m2, Cycle 5) Administration: 800 mg (09/18/2018), 800 mg (09/25/2018), 800 mg (10/02/2018)  for chemotherapy treatment.    10/22/2018 - 10/23/2018 Chemotherapy   The patient had dexamethasone  (DECADRON ) 4 MG tablet, 8 mg, Oral, Daily, 1 of 1 cycle, Start date: 11/03/2018, End date: 11/03/2018 palonosetron  (ALOXI ) injection 0.25 mg, 0.25 mg, Intravenous,  Once, 1 of 4 cycles Administration: 0.25 mg (10/22/2018) riTUXimab  (RITUXAN ) 1,000 mg in sodium chloride  0.9 % 250 mL (2.8571 mg/mL) infusion, 500 mg/m2 = 1,000 mg (100 % of original dose 500 mg/m2), Intravenous,  Once, 1 of 1 cycle Dose modification: 500 mg/m2 (original dose 500 mg/m2, Cycle 1, Reason: Provider  Judgment) bendamustine  (BENDEKA ) 125 mg in sodium chloride  0.9 % 50 mL (2.2727 mg/mL) chemo infusion, 70 mg/m2 = 125 mg (100 % of original dose 70 mg/m2), Intravenous,  Once, 1 of 4 cycles Dose modification: 70 mg/m2 (original dose 70 mg/m2, Cycle 1, Reason: Provider Judgment) Administration: 125 mg (10/22/2018), 125 mg (10/23/2018) riTUXimab  (RITUXAN ) 1,000 mg in sodium chloride  0.9 % 150 mL infusion, 500 mg/m2 = 1,000 mg (100 % of original dose 500 mg/m2), Intravenous,  Once, 1 of 1 cycle Dose modification: 500 mg/m2 (original dose 500 mg/m2, Cycle 1) Administration: 1,000 mg (10/22/2018) ofatumumab (ARZERRA) 300 mg in sodium chloride  0.9 % 985 mL chemo infusion, 300 mg (100 % of original dose 300 mg), Intravenous,  Once, 0 of 3 cycles Dose modification: 300 mg (original dose 300 mg, Cycle 2), 1,000 mg (original dose 1,000 mg, Cycle 2)  for chemotherapy treatment.    11/11/2018 - 09/09/2019 Chemotherapy   The patient had obinutuzumab  (GAZYVA ) 100 mg in sodium chloride  0.9 % 100 mL (0.9615 mg/mL) chemo infusion, 100 mg (100 % of original dose 100 mg), Intravenous, Once, 15 of 16 cycles Dose modification: 100 mg (original dose 100 mg, Cycle 1, Reason: Provider Judgment), 900 mg (original dose 900 mg, Cycle 2, Reason: Provider Judgment) Administration: 100 mg (11/11/2018), 900 mg (11/12/2018), 1,000 mg (11/19/2018), 1,000 mg (12/26/2018), 1,000 mg (02/03/2019), 1,000 mg (03/17/2019), 1,000 mg (04/07/2019), 1,000 mg (04/28/2019), 1,000 mg (02/25/2019), 1,000 mg (05/19/2019), 1,000 mg (06/12/2019), 1,000 mg (07/03/2019), 1,000 mg (07/24/2019), 1,000 mg (08/13/2019), 1,000 mg (09/09/2019)  for chemotherapy treatment.    11/19/2018 - 05/19/2019 Chemotherapy   The patient had DOXOrubicin  (ADRIAMYCIN ) chemo injection 100 mg, 50 mg/m2 =  100 mg, Intravenous,  Once, 8 of 8 cycles Dose modification: 25 mg/m2 (original dose 50 mg/m2, Cycle 2, Reason: Provider Judgment), 75 mg (original dose 50 mg/m2, Cycle 3, Reason: Provider  Judgment), 40 mg/m2 (original dose 50 mg/m2, Cycle 3, Reason: Provider Judgment), 30 mg/m2 (original dose 50 mg/m2, Cycle 4, Reason: Provider Judgment, Comment: Per MD instruction), 35 mg/m2 (original dose 50 mg/m2, Cycle 4, Reason: Provider Judgment), 35 mg/m2 (original dose 50 mg/m2, Cycle 5, Reason: Provider Judgment), 35 mg/m2 (original dose 50 mg/m2, Cycle 6, Reason: Provider Judgment), 35 mg/m2 (original dose 50 mg/m2, Cycle 7, Reason: Provider Judgment) Administration: 100 mg (11/19/2018), 50 mg (12/26/2018), 60 mg (02/03/2019), 70 mg (02/24/2019), 70 mg (03/17/2019), 70 mg (04/07/2019), 70 mg (04/28/2019), 70 mg (05/19/2019) palonosetron  (ALOXI ) injection 0.25 mg, 0.25 mg, Intravenous,  Once, 8 of 8 cycles Administration: 0.25 mg (11/19/2018), 0.25 mg (12/26/2018), 0.25 mg (02/03/2019), 0.25 mg (02/24/2019), 0.25 mg (03/17/2019), 0.25 mg (04/07/2019), 0.25 mg (04/28/2019), 0.25 mg (05/19/2019) pegfilgrastim  (NEULASTA ) injection 6 mg, 6 mg, Subcutaneous, Once, 1 of 1 cycle Administration: 6 mg (11/21/2018) pegfilgrastim  (NEULASTA  ONPRO KIT) injection 6 mg, 6 mg, Subcutaneous, Once, 7 of 7 cycles Administration: 6 mg (12/26/2018), 6 mg (02/03/2019), 6 mg (02/24/2019), 6 mg (03/17/2019), 6 mg (04/07/2019), 6 mg (04/28/2019), 6 mg (05/19/2019) cyclophosphamide  (CYTOXAN ) 1,500 mg in sodium chloride  0.9 % 250 mL chemo infusion, 750 mg/m2 = 1,500 mg, Intravenous,  Once, 8 of 8 cycles Dose modification: 400 mg/m2 (original dose 750 mg/m2, Cycle 2, Reason: Provider Judgment), 500 mg/m2 (original dose 750 mg/m2, Cycle 3, Reason: Provider Judgment), 500 mg/m2 (original dose 750 mg/m2, Cycle 4, Reason: Provider Judgment, Comment: Per MD instruction), 550 mg/m2 (original dose 750 mg/m2, Cycle 4, Reason: Provider Judgment), 550 mg/m2 (original dose 750 mg/m2, Cycle 5, Reason: Provider Judgment), 550 mg/m2 (original dose 750 mg/m2, Cycle 6, Reason: Provider Judgment), 550 mg/m2 (original dose 750 mg/m2, Cycle 7, Reason: Provider  Judgment) Administration: 1,500 mg (11/19/2018), 800 mg (12/26/2018), 1,000 mg (02/03/2019), 1,100 mg (02/24/2019), 1,100 mg (03/17/2019), 1,100 mg (04/07/2019), 1,100 mg (04/28/2019), 1,100 mg (05/19/2019) fosaprepitant  (EMEND ) 150 mg, dexamethasone  (DECADRON ) 12 mg in sodium chloride  0.9 % 145 mL IVPB, , Intravenous,  Once, 8 of 8 cycles Administration:  (11/19/2018),  (12/26/2018),  (02/03/2019),  (02/24/2019),  (03/17/2019),  (04/07/2019),  (04/28/2019),  (05/19/2019)  for chemotherapy treatment.     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             (b) rituximab  added to  ibrutinib , first dose 08/27/2018             (c) hepatitis B studies 08/27/2018 negative             (f) ibrutinib /rituximab  discontinued after 10/02/2018 dose w/o obvious response   (6) bendamustine /rituximab  started 10/22/2018, discontinued after 1 cycle with rapid progression   (7) obinutuzumab /Gaziva started 11/11/2018             (a) second dose given 11/19/2018 together with chemotherapy             (b)  third dose given 12/26/2018 and continuing every 3 weeks             (c) obinutuzumab  discontinued after 09/09/2019 dose, with documentation of complete remission   (8) CH[O]P chemotherapy started 11/19/2018, completed 8 doses 05/19/2019             (a) echocardiogram on 11/07/2018 shows EF of 55-60%             (b) second cycle of CH[O]P postponed because of pandemic, given 12/26/2018, greatly reduced doses             (c) cycle 3 of CH[O]P/obinutuzumab  given 02/03/2019 at increased doses             (d) cycle 4 and 5 of CH[O]P given on 6/16 and 7/7 at increased doses of 35mg /m2 and 550mg /m2             (e) Repeat echo on 04/22/2019 shows an EF of 60-65%   (9) severe immunocompromise: Marked hypogammaglobulinemia             (a) starting March 2020 she received IVIG every 8 weeks as needed             (b) starting September 2021 she receives IVIG every 3 months             (c) received Evusheld 11/21/2020   (10) 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           rivaroxaban  stopped December 2021   (11) started venetoclax  ramp-up 06/11/2019, progressed to 400 mg/d dose w/o event             (a) dose reduced to 200 mg daily 08/05/2019 secondary to cytopenias             (b) PET scan December 2021 shows small minimally hypermetabolic residual periportal lymph nodes but no additional adenopathy elsewhere.  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 female Is here for continued evaluation and management of CLL/SLL. She was last seen in clinic by us  on 11/29/2023.SABRA She notes that she was feeling a little depressed from her Ozempic  and had medication changes with her PCP which has improved her depression. Notes intermittent mild leg swelling which is improved with Lasix . She is tolerating her venetoclax  at 200 mg p.o. daily without any acute new concerns. No new infection  issues. No fevers no chills no night sweats no unexpected weight loss. No new lumps or bumps abdominal pain or distention. Labs done today were reviewed in detail with the patient.   ALLERGIES:  is allergic to iohexol, compazine  [prochlorperazine  edisylate], contrast media  [iodinated contrast media], lactose intolerance (gi), and cephalexin.  MEDICATIONS:  Current Outpatient Medications  Medication Sig Dispense Refill   acetaminophen  (TYLENOL  8 HOUR ARTHRITIS PAIN) 650 MG CR tablet Take 650 mg by mouth every 8 (  eight) hours as needed for pain.     cholecalciferol (VITAMIN D3) 25 MCG (1000 UNIT) tablet Take 5,000 Units by mouth daily.     rosuvastatin  (CRESTOR ) 10 MG tablet Take 1 tablet (10 mg total) by mouth at bedtime. 90 tablet 3   Semaglutide , 1 MG/DOSE, (OZEMPIC , 1 MG/DOSE,) 4 MG/3ML SOPN Inject 1 mg into the skin once a week. 3 mL 3   SYNTHROID  88 MCG tablet Take 1 tablet (88 mcg total) by mouth daily. 90 tablet 3   furosemide  (LASIX ) 20 MG tablet Take 1 tablet by mouth once daily (Patient not taking: Reported on 04/20/2024) 30 tablet 3   venetoclax  (VENCLEXTA ) 100 MG tablet TAKE 2 TABLETS BY MOUTH DAILY. TAKE WITH FOOD AND WATER AT APPROXIMATELY THE SAME TIME EACH DAY. 56 tablet 2   No current facility-administered medications for this visit.   REVIEW OF SYSTEMS:  10 Point review of Systems was done is negative except as noted above.  PHYSICAL EXAMINATION: ECOG PERFORMANCE STATUS: 1 - Symptomatic but completely ambulatory  Vitals:   04/20/24 1334  BP: 135/61  Pulse: 61  Resp: 20  Temp: (!) 97.2 F (36.2 C)  SpO2: 98%     Filed Weights   04/20/24 1334  Weight: 188 lb 4.8 oz (85.4 kg)   NAD GENERAL:alert, in no acute distress and comfortable SKIN: no acute rashes, no significant lesions EYES: conjunctiva are pink and non-injected, sclera anicteric OROPHARYNX: MMM, no exudates, no oropharyngeal erythema or ulceration NECK: supple, no JVD LYMPH:  no palpable  lymphadenopathy in the cervical, axillary or inguinal regions LUNGS: clear to auscultation b/l with normal respiratory effort HEART: regular rate & rhythm ABDOMEN:  normoactive bowel sounds , non tender, not distended. Extremity: no pedal edema PSYCH: alert & oriented x 3 with fluent speech NEURO: no focal motor/sensory deficits  LABORATORY DATA:  I have reviewed the data as listed .    Latest Ref Rng & Units 04/20/2024   12:59 PM 11/29/2023   12:24 PM 07/23/2023   12:08 PM  CBC  WBC 4.0 - 10.5 K/uL 5.7  5.3  6.1   Hemoglobin 12.0 - 15.0 g/dL 85.2  86.1  85.3   Hematocrit 36.0 - 46.0 % 43.7  42.6  44.9   Platelets 150 - 400 K/uL 143  152  182    .    Latest Ref Rng & Units 04/20/2024   12:59 PM 11/29/2023   12:24 PM 07/23/2023   12:08 PM  CMP  Glucose 70 - 99 mg/dL 94  871  886   BUN 8 - 23 mg/dL 16  20  18    Creatinine 0.44 - 1.00 mg/dL 8.97  8.99  8.79   Sodium 135 - 145 mmol/L 141  141  140   Potassium 3.5 - 5.1 mmol/L 3.9  4.0  3.7   Chloride 98 - 111 mmol/L 109  107  105   CO2 22 - 32 mmol/L 27  28  30    Calcium  8.9 - 10.3 mg/dL 9.5  9.9  89.9   Total Protein 6.5 - 8.1 g/dL 5.9  6.0  6.2   Total Bilirubin 0.0 - 1.2 mg/dL 0.6  0.7  0.7   Alkaline Phos 38 - 126 U/L 59  68  69   AST 15 - 41 U/L 12  15  17    ALT 0 - 44 U/L 8  14  16     . 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             (b) rituximab  added to ibrutinib , first dose 08/27/2018             (c) hepatitis B studies 08/27/2018 negative             (f) ibrutinib /rituximab  discontinued after 10/02/2018 dose w/o obvious response   (6) bendamustine /rituximab  started 10/22/2018, discontinued after 1 cycle with rapid progression   (7) obinutuzumab /Gaziva started 11/11/2018             (a) second dose given 11/19/2018 together with chemotherapy             (b) third dose given 12/26/2018 and continuing every 3 weeks             (c) obinutuzumab  discontinued after 09/09/2019 dose, with documentation of complete remission   (8) CH[O]P chemotherapy started 11/19/2018, completed 8 doses 05/19/2019             (a) echocardiogram on 11/07/2018 shows EF of 55-60%             (b) second cycle of CH[O]P postponed because of pandemic, given 12/26/2018, greatly reduced doses             (c) cycle 3 of CH[O]P/obinutuzumab  given 02/03/2019 at increased doses             (d) cycle 4 and 5 of CH[O]P given on 6/16 and 7/7 at increased doses of 35mg /m2 and 550mg /m2             (e) Repeat echo on 04/22/2019 shows an EF of 60-65%   (9) severe immunocompromise: Marked hypogammaglobulinemia             (a) starting March 2020 she received IVIG every 8 weeks as needed             (b) starting September 2021 she receives IVIG every 3 months             (c) received Evusheld 11/21/2020   (10) 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           rivaroxaban  stopped December 2021   (11) started venetoclax   ramp-up 06/11/2019, progressed to 400 mg/d dose w/o event             (a) dose reduced to 200 mg daily 08/05/2019 secondary to cytopenias             (  b) PET scan December 2021 shows small minimally hypermetabolic residual periportal lymph nodes but no additional adenopathy elsewhere.  PLAN:  -Discussed lab results on 04/20/2024 in detail with the patient Blood counts are stable with minimal thrombocytopenia CMP stable Patient has no lab or clinical symptoms or signs suggesting CLL progression at this time. No notable toxicities from the current dose of venetoclax . Continue venetoclax  20 mg p.o. daily till significant progression or toxicities. Continue follow-up with PCP for management of diabetes and other medical issues.  FOLLOW UP: RTC with Dr Onesimo with portflush, labs and MD visit in 4 months   .The total time spent in the appointment was 21 minutes* .  All of the patient's questions were answered with apparent satisfaction. The patient knows to call the clinic with any problems, questions or concerns.   Emaline Onesimo MD MS AAHIVMS [facility] Hematology/Oncology Physician [facility]  .*Total Encounter Time as defined by the Centers for Medicare and Medicaid Services includes, in addition to the face-to-face time of a patient visit (documented in the note above) non-face-to-face time: obtaining and reviewing outside history, ordering and reviewing medications, tests or procedures, care coordination (communications with other health care professionals or caregivers) and documentation in the medical record.

## 2024-04-27 ENCOUNTER — Encounter (INDEPENDENT_AMBULATORY_CARE_PROVIDER_SITE_OTHER): Payer: Self-pay

## 2024-04-27 ENCOUNTER — Other Ambulatory Visit: Payer: Self-pay

## 2024-04-27 ENCOUNTER — Other Ambulatory Visit (HOSPITAL_COMMUNITY): Payer: Self-pay

## 2024-04-28 ENCOUNTER — Other Ambulatory Visit: Payer: Self-pay

## 2024-04-28 NOTE — Progress Notes (Signed)
 Specialty Pharmacy Refill Coordination Note  Maria [name] is a 73 y.o. female contacted today regarding refills of specialty medication(s) Venetoclax  (VENCLEXTA )   Patient requested (Patient-Rptd) Delivery   Delivery date: 04/30/24   Verified address: (Patient-Rptd) [address] [place], [place] [address]   Medication will be filled on 08.20.25.

## 2024-05-06 ENCOUNTER — Other Ambulatory Visit (HOSPITAL_COMMUNITY): Payer: Self-pay

## 2024-05-20 ENCOUNTER — Other Ambulatory Visit (HOSPITAL_COMMUNITY): Payer: Self-pay

## 2024-05-23 ENCOUNTER — Other Ambulatory Visit (HOSPITAL_COMMUNITY): Payer: Self-pay

## 2024-05-25 ENCOUNTER — Encounter (INDEPENDENT_AMBULATORY_CARE_PROVIDER_SITE_OTHER): Payer: Self-pay

## 2024-05-26 ENCOUNTER — Other Ambulatory Visit: Payer: Self-pay

## 2024-05-26 ENCOUNTER — Other Ambulatory Visit: Payer: Self-pay | Admitting: Pharmacy Technician

## 2024-05-26 NOTE — Progress Notes (Signed)
 Specialty Pharmacy Refill Coordination Note  Maria [name] is a 73 y.o. female contacted today regarding refills of specialty medication(s) Venetoclax  (VENCLEXTA )   Patient requested Delivery   Delivery date: 05/28/24   Verified address: [address] [place], [place] [address]   Medication will be filled on 05/27/24. Questionnaire answered

## 2024-05-27 ENCOUNTER — Other Ambulatory Visit: Payer: Self-pay

## 2024-05-28 ENCOUNTER — Other Ambulatory Visit (HOSPITAL_COMMUNITY): Payer: Self-pay

## 2024-05-29 ENCOUNTER — Other Ambulatory Visit: Payer: Self-pay

## 2024-05-29 ENCOUNTER — Other Ambulatory Visit (HOSPITAL_COMMUNITY): Payer: Self-pay

## 2024-05-29 MED ORDER — ROSUVASTATIN CALCIUM 10 MG PO TABS
10.0000 mg | ORAL_TABLET | Freq: Every day | ORAL | 3 refills | Status: AC
  Filled 2024-05-29 (×2): qty 90, 90d supply, fill #0
  Filled 2024-07-27 – 2024-08-31 (×2): qty 90, 90d supply, fill #1

## 2024-06-03 ENCOUNTER — Other Ambulatory Visit: Payer: Self-pay

## 2024-06-03 NOTE — Progress Notes (Signed)
 Specialty Pharmacy Ongoing Clinical Assessment Note  Maria [name] is a 73 y.o. female who is being followed by the specialty pharmacy service for RxSp Oncology   Patient's specialty medication(s) reviewed today: Venetoclax  (VENCLEXTA )   Missed doses in the last 4 weeks: 0   Patient/Caregiver did not have any additional questions or concerns.   Therapeutic benefit summary: Patient is achieving benefit   Adverse events/side effects summary: No adverse events/side effects   Patient's therapy is appropriate to: Continue    Goals Addressed             This Visit's Progress    Achieve or maintain remission   On track    Patient is on track. Patient will maintain adherence.  Dr. Onesimo reported no new signs or symptoms of CLL progression at OV on 04/20/24        Follow up: 6 months  [facility] Specialty Pharmacist

## 2024-06-11 ENCOUNTER — Other Ambulatory Visit (HOSPITAL_COMMUNITY): Payer: Self-pay

## 2024-06-12 ENCOUNTER — Other Ambulatory Visit: Payer: Self-pay

## 2024-06-12 ENCOUNTER — Other Ambulatory Visit (HOSPITAL_COMMUNITY): Payer: Self-pay

## 2024-06-12 MED ORDER — OZEMPIC (1 MG/DOSE) 4 MG/3ML ~~LOC~~ SOPN
1.0000 mg | PEN_INJECTOR | SUBCUTANEOUS | 3 refills | Status: AC
  Filled 2024-06-12: qty 3, 28d supply, fill #0
  Filled 2024-07-07: qty 3, 28d supply, fill #1
  Filled 2024-08-15: qty 3, 28d supply, fill #2
  Filled 2024-09-20: qty 3, 28d supply, fill #3

## 2024-06-19 ENCOUNTER — Encounter

## 2024-06-22 ENCOUNTER — Other Ambulatory Visit: Payer: Self-pay

## 2024-06-22 ENCOUNTER — Other Ambulatory Visit (HOSPITAL_COMMUNITY): Payer: Self-pay

## 2024-06-26 ENCOUNTER — Other Ambulatory Visit: Payer: Self-pay

## 2024-06-26 NOTE — Progress Notes (Signed)
 Specialty Pharmacy Refill Coordination Note  Maria [name] is a 73 y.o. female contacted today regarding refills of specialty medication(s) Venetoclax  (VENCLEXTA )   Patient requested Delivery   Delivery date: 07/06/24   Verified address: [address] [place], [place] [address]   Medication will be filled on 07/03/24.

## 2024-06-28 ENCOUNTER — Encounter (INDEPENDENT_AMBULATORY_CARE_PROVIDER_SITE_OTHER): Payer: Self-pay

## 2024-06-29 ENCOUNTER — Other Ambulatory Visit: Payer: Self-pay

## 2024-07-03 ENCOUNTER — Other Ambulatory Visit: Payer: Self-pay

## 2024-07-07 ENCOUNTER — Telehealth: Payer: Self-pay

## 2024-07-07 ENCOUNTER — Other Ambulatory Visit (HOSPITAL_COMMUNITY): Payer: Self-pay

## 2024-07-07 ENCOUNTER — Encounter: Payer: Self-pay | Admitting: Oncology

## 2024-07-07 NOTE — Telephone Encounter (Signed)
 Oral Oncology Patient Advocate Encounter  Was successful in securing patient a $8000 grant from [facility] to provide copayment coverage for Venclexta .  This will keep the out of pocket expense at $0.     [employer] ID: [id number]   The billing information is as follows and has been shared with [facility].    RxBin: [id number] PCN: [id number] Member ID: [id number] Group ID: [id number] Dates of Eligibility: 06/30/24 through 06/29/25  Fund:  CLL  Lucie Lamer, CPhT [place]  [facility] Specialty Pharmacy Services Oncology Pharmacy Patient Advocate Specialist II THERES[place] Phone: [phone]  Fax: [phone] [email or web address]

## 2024-07-08 ENCOUNTER — Other Ambulatory Visit: Payer: Self-pay

## 2024-07-14 DIAGNOSIS — N1831 Chronic kidney disease, stage 3a: Secondary | ICD-10-CM | POA: Diagnosis not present

## 2024-07-14 DIAGNOSIS — I129 Hypertensive chronic kidney disease with stage 1 through stage 4 chronic kidney disease, or unspecified chronic kidney disease: Secondary | ICD-10-CM | POA: Diagnosis not present

## 2024-07-14 DIAGNOSIS — E1149 Type 2 diabetes mellitus with other diabetic neurological complication: Secondary | ICD-10-CM | POA: Diagnosis not present

## 2024-07-14 DIAGNOSIS — M810 Age-related osteoporosis without current pathological fracture: Secondary | ICD-10-CM | POA: Diagnosis not present

## 2024-07-14 DIAGNOSIS — E039 Hypothyroidism, unspecified: Secondary | ICD-10-CM | POA: Diagnosis not present

## 2024-07-14 DIAGNOSIS — E782 Mixed hyperlipidemia: Secondary | ICD-10-CM | POA: Diagnosis not present

## 2024-07-14 DIAGNOSIS — E785 Hyperlipidemia, unspecified: Secondary | ICD-10-CM | POA: Diagnosis not present

## 2024-07-21 ENCOUNTER — Other Ambulatory Visit: Payer: Self-pay | Admitting: Internal Medicine

## 2024-07-21 DIAGNOSIS — I129 Hypertensive chronic kidney disease with stage 1 through stage 4 chronic kidney disease, or unspecified chronic kidney disease: Secondary | ICD-10-CM | POA: Diagnosis not present

## 2024-07-21 DIAGNOSIS — C9110 Chronic lymphocytic leukemia of B-cell type not having achieved remission: Secondary | ICD-10-CM | POA: Diagnosis not present

## 2024-07-21 DIAGNOSIS — N1831 Chronic kidney disease, stage 3a: Secondary | ICD-10-CM | POA: Diagnosis not present

## 2024-07-21 DIAGNOSIS — R82998 Other abnormal findings in urine: Secondary | ICD-10-CM | POA: Diagnosis not present

## 2024-07-21 DIAGNOSIS — E039 Hypothyroidism, unspecified: Secondary | ICD-10-CM | POA: Diagnosis not present

## 2024-07-21 DIAGNOSIS — I10 Essential (primary) hypertension: Secondary | ICD-10-CM | POA: Diagnosis not present

## 2024-07-21 DIAGNOSIS — Z1331 Encounter for screening for depression: Secondary | ICD-10-CM | POA: Diagnosis not present

## 2024-07-21 DIAGNOSIS — Z23 Encounter for immunization: Secondary | ICD-10-CM | POA: Diagnosis not present

## 2024-07-21 DIAGNOSIS — R600 Localized edema: Secondary | ICD-10-CM | POA: Diagnosis not present

## 2024-07-21 DIAGNOSIS — Z1339 Encounter for screening examination for other mental health and behavioral disorders: Secondary | ICD-10-CM | POA: Diagnosis not present

## 2024-07-21 DIAGNOSIS — E1149 Type 2 diabetes mellitus with other diabetic neurological complication: Secondary | ICD-10-CM | POA: Diagnosis not present

## 2024-07-21 DIAGNOSIS — M810 Age-related osteoporosis without current pathological fracture: Secondary | ICD-10-CM | POA: Diagnosis not present

## 2024-07-21 DIAGNOSIS — Z1231 Encounter for screening mammogram for malignant neoplasm of breast: Secondary | ICD-10-CM

## 2024-07-21 DIAGNOSIS — Z0000 Encounter for general adult medical examination without abnormal findings: Secondary | ICD-10-CM | POA: Diagnosis not present

## 2024-07-22 ENCOUNTER — Encounter: Payer: Self-pay | Admitting: Internal Medicine

## 2024-07-22 DIAGNOSIS — Z1231 Encounter for screening mammogram for malignant neoplasm of breast: Secondary | ICD-10-CM

## 2024-07-23 ENCOUNTER — Other Ambulatory Visit: Payer: Self-pay

## 2024-07-23 ENCOUNTER — Other Ambulatory Visit: Payer: Self-pay | Admitting: Hematology

## 2024-07-23 DIAGNOSIS — C9110 Chronic lymphocytic leukemia of B-cell type not having achieved remission: Secondary | ICD-10-CM

## 2024-07-23 MED ORDER — VENETOCLAX 100 MG PO TABS
ORAL_TABLET | ORAL | 2 refills | Status: AC
  Filled 2024-07-23: qty 56, fill #0
  Filled 2024-07-27: qty 56, 28d supply, fill #0
  Filled 2024-08-19: qty 56, 28d supply, fill #1
  Filled 2024-09-09 – 2024-09-29 (×2): qty 56, 28d supply, fill #2

## 2024-07-27 ENCOUNTER — Other Ambulatory Visit: Payer: Self-pay

## 2024-07-27 ENCOUNTER — Encounter (INDEPENDENT_AMBULATORY_CARE_PROVIDER_SITE_OTHER): Payer: Self-pay

## 2024-07-27 NOTE — Progress Notes (Signed)
 Specialty Pharmacy Refill Coordination Note  Maria [name] is a 73 y.o. female contacted today regarding refills of specialty medication(s) Venetoclax  (VENCLEXTA )   Patient requested (Patient-Rptd) Delivery   Delivery date: 07/30/24   Verified address: [address] [name] [address]   Medication will be filled on: 07/29/24

## 2024-07-28 ENCOUNTER — Other Ambulatory Visit: Payer: Self-pay

## 2024-07-30 ENCOUNTER — Other Ambulatory Visit: Payer: Self-pay

## 2024-07-30 ENCOUNTER — Other Ambulatory Visit (HOSPITAL_COMMUNITY): Payer: Self-pay

## 2024-08-16 ENCOUNTER — Other Ambulatory Visit (HOSPITAL_COMMUNITY): Payer: Self-pay

## 2024-08-18 ENCOUNTER — Other Ambulatory Visit: Payer: Self-pay

## 2024-08-18 DIAGNOSIS — C9110 Chronic lymphocytic leukemia of B-cell type not having achieved remission: Secondary | ICD-10-CM

## 2024-08-18 DIAGNOSIS — D801 Nonfamilial hypogammaglobulinemia: Secondary | ICD-10-CM

## 2024-08-19 ENCOUNTER — Other Ambulatory Visit: Payer: Self-pay

## 2024-08-20 ENCOUNTER — Other Ambulatory Visit (HOSPITAL_COMMUNITY): Payer: Self-pay

## 2024-08-20 ENCOUNTER — Other Ambulatory Visit: Payer: Self-pay

## 2024-08-20 NOTE — Progress Notes (Signed)
 Specialty Pharmacy Refill Coordination Note  MyChart Questionnaire Submission  Maria [name] is a 73 y.o. female contacted today regarding refills of specialty medication(s) Venclexta .  Doses on hand: (Patient-Rptd) 4   Injection date: (Patient-Rptd) 08/26/24  Patient requested: (Patient-Rptd) Delivery   Delivery date: 08/21/24  Verified address: [address] [place] [address]  Medication will be filled on 08/20/24

## 2024-08-21 ENCOUNTER — Inpatient Hospital Stay: Admitting: Hematology

## 2024-08-21 ENCOUNTER — Inpatient Hospital Stay

## 2024-08-24 ENCOUNTER — Ambulatory Visit
Admission: RE | Admit: 2024-08-24 | Discharge: 2024-08-24 | Disposition: A | Discharge status: Home / Self Care | Source: Ambulatory Visit | Attending: Internal Medicine | Admitting: Internal Medicine

## 2024-08-24 DIAGNOSIS — Z1231 Encounter for screening mammogram for malignant neoplasm of breast: Secondary | ICD-10-CM

## 2024-08-31 ENCOUNTER — Other Ambulatory Visit: Payer: Self-pay

## 2024-09-09 ENCOUNTER — Other Ambulatory Visit (HOSPITAL_COMMUNITY): Payer: Self-pay

## 2024-09-11 ENCOUNTER — Other Ambulatory Visit: Payer: Self-pay

## 2024-09-11 ENCOUNTER — Other Ambulatory Visit (HOSPITAL_COMMUNITY): Payer: Self-pay

## 2024-09-21 ENCOUNTER — Other Ambulatory Visit (HOSPITAL_COMMUNITY): Payer: Self-pay

## 2024-09-29 ENCOUNTER — Other Ambulatory Visit: Payer: Self-pay

## 2024-10-01 ENCOUNTER — Other Ambulatory Visit: Payer: Self-pay

## 2024-10-01 ENCOUNTER — Other Ambulatory Visit: Payer: Self-pay | Admitting: Pharmacy Technician

## 2024-10-01 NOTE — Progress Notes (Signed)
 Specialty Pharmacy Refill Coordination Note  Maria [name] is a 74 y.o. female contacted today regarding refills of specialty medication(s) Venetoclax  (VENCLEXTA )   Patient requested Delivery   Delivery date: 10/02/24   Verified address: [address]  [place], [place] [address]   Medication will be filled on: 10/01/24

## 2024-10-05 ENCOUNTER — Inpatient Hospital Stay: Admitting: Hematology

## 2024-10-05 ENCOUNTER — Inpatient Hospital Stay

## 2024-10-19 ENCOUNTER — Inpatient Hospital Stay

## 2024-10-19 ENCOUNTER — Inpatient Hospital Stay: Admitting: Hematology
